# Patient Record
Sex: Male | Born: 1935 | Race: White | Hispanic: No | Marital: Married | Smoking: Former smoker
Health system: Southern US, Community
[De-identification: ages and names within clinical notes are randomized; demographics above are authoritative.]

## PROBLEM LIST (undated history)

## (undated) DIAGNOSIS — N529 Male erectile dysfunction, unspecified: Secondary | ICD-10-CM

## (undated) DIAGNOSIS — K449 Diaphragmatic hernia without obstruction or gangrene: Secondary | ICD-10-CM

## (undated) DIAGNOSIS — C4442 Squamous cell carcinoma of skin of scalp and neck: Secondary | ICD-10-CM

## (undated) DIAGNOSIS — J189 Pneumonia, unspecified organism: Secondary | ICD-10-CM

## (undated) DIAGNOSIS — J439 Emphysema, unspecified: Secondary | ICD-10-CM

## (undated) DIAGNOSIS — J449 Chronic obstructive pulmonary disease, unspecified: Secondary | ICD-10-CM

## (undated) DIAGNOSIS — D519 Vitamin B12 deficiency anemia, unspecified: Secondary | ICD-10-CM

## (undated) DIAGNOSIS — Z9981 Dependence on supplemental oxygen: Secondary | ICD-10-CM

## (undated) DIAGNOSIS — C44320 Squamous cell carcinoma of skin of unspecified parts of face: Secondary | ICD-10-CM

## (undated) DIAGNOSIS — N281 Cyst of kidney, acquired: Secondary | ICD-10-CM

## (undated) DIAGNOSIS — I10 Essential (primary) hypertension: Secondary | ICD-10-CM

## (undated) DIAGNOSIS — F329 Major depressive disorder, single episode, unspecified: Secondary | ICD-10-CM

## (undated) DIAGNOSIS — N400 Enlarged prostate without lower urinary tract symptoms: Secondary | ICD-10-CM

## (undated) DIAGNOSIS — E781 Pure hyperglyceridemia: Secondary | ICD-10-CM

## (undated) DIAGNOSIS — M8580 Other specified disorders of bone density and structure, unspecified site: Secondary | ICD-10-CM

## (undated) DIAGNOSIS — J84112 Idiopathic pulmonary fibrosis: Secondary | ICD-10-CM

## (undated) DIAGNOSIS — I4891 Unspecified atrial fibrillation: Secondary | ICD-10-CM

## (undated) DIAGNOSIS — C434 Malignant melanoma of scalp and neck: Secondary | ICD-10-CM

## (undated) DIAGNOSIS — M1990 Unspecified osteoarthritis, unspecified site: Secondary | ICD-10-CM

## (undated) DIAGNOSIS — G629 Polyneuropathy, unspecified: Secondary | ICD-10-CM

## (undated) DIAGNOSIS — R251 Tremor, unspecified: Secondary | ICD-10-CM

## (undated) DIAGNOSIS — D126 Benign neoplasm of colon, unspecified: Secondary | ICD-10-CM

## (undated) DIAGNOSIS — Z7901 Long term (current) use of anticoagulants: Secondary | ICD-10-CM

## (undated) DIAGNOSIS — F419 Anxiety disorder, unspecified: Secondary | ICD-10-CM

## (undated) DIAGNOSIS — J8410 Pulmonary fibrosis, unspecified: Secondary | ICD-10-CM

## (undated) DIAGNOSIS — K5080 Crohn's disease of both small and large intestine without complications: Secondary | ICD-10-CM

## (undated) DIAGNOSIS — F32A Depression, unspecified: Secondary | ICD-10-CM

## (undated) DIAGNOSIS — I639 Cerebral infarction, unspecified: Secondary | ICD-10-CM

## (undated) HISTORY — DX: Benign prostatic hyperplasia without lower urinary tract symptoms: N40.0

## (undated) HISTORY — DX: Male erectile dysfunction, unspecified: N52.9

## (undated) HISTORY — DX: Tremor, unspecified: R25.1

## (undated) HISTORY — DX: Essential (primary) hypertension: I10

## (undated) HISTORY — DX: Crohn's disease of both small and large intestine without complications: K50.80

## (undated) HISTORY — DX: Cerebral infarction, unspecified: I63.9

## (undated) HISTORY — DX: Pure hyperglyceridemia: E78.1

## (undated) HISTORY — DX: Dependence on supplemental oxygen: Z99.81

## (undated) HISTORY — DX: Pulmonary fibrosis, unspecified: J84.10

## (undated) HISTORY — DX: Benign neoplasm of colon, unspecified: D12.6

## (undated) HISTORY — DX: Cyst of kidney, acquired: N28.1

## (undated) HISTORY — DX: Unspecified osteoarthritis, unspecified site: M19.90

## (undated) HISTORY — PX: MOHS SURGERY: SUR867

## (undated) HISTORY — DX: Long term (current) use of anticoagulants: Z79.01

## (undated) HISTORY — DX: Other specified disorders of bone density and structure, unspecified site: M85.80

## (undated) HISTORY — DX: Pneumonia, unspecified organism: J18.9

## (undated) HISTORY — DX: Idiopathic pulmonary fibrosis: J84.112

## (undated) HISTORY — DX: Polyneuropathy, unspecified: G62.9

## (undated) HISTORY — PX: COLON SURGERY: SHX602

## (undated) HISTORY — DX: Emphysema, unspecified: J43.9

---
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
## 1982-03-24 HISTORY — PX: APPENDECTOMY: SHX54

## 1982-03-24 HISTORY — PX: COLON RESECTION: SHX5231

## 1997-10-30 ENCOUNTER — Emergency Department (HOSPITAL_COMMUNITY): Admission: EM | Admit: 1997-10-30 | Discharge: 1997-10-31 | Payer: Self-pay | Admitting: Emergency Medicine

## 1997-10-31 ENCOUNTER — Ambulatory Visit (HOSPITAL_COMMUNITY): Admission: RE | Admit: 1997-10-31 | Discharge: 1997-10-31 | Payer: Self-pay | Admitting: Emergency Medicine

## 2001-06-16 ENCOUNTER — Encounter: Payer: Self-pay | Admitting: Gastroenterology

## 2002-06-27 ENCOUNTER — Encounter: Payer: Self-pay | Admitting: Internal Medicine

## 2004-02-02 ENCOUNTER — Ambulatory Visit: Payer: Self-pay | Admitting: Internal Medicine

## 2004-08-02 ENCOUNTER — Encounter: Payer: Self-pay | Admitting: Family Medicine

## 2004-08-02 ENCOUNTER — Ambulatory Visit: Payer: Self-pay | Admitting: Family Medicine

## 2004-08-12 ENCOUNTER — Ambulatory Visit: Payer: Self-pay | Admitting: Family Medicine

## 2004-12-12 ENCOUNTER — Ambulatory Visit: Payer: Self-pay | Admitting: Family Medicine

## 2004-12-27 ENCOUNTER — Ambulatory Visit: Payer: Self-pay | Admitting: Internal Medicine

## 2004-12-30 ENCOUNTER — Ambulatory Visit: Payer: Self-pay | Admitting: Family Medicine

## 2005-03-24 HISTORY — PX: CATARACT EXTRACTION W/ INTRAOCULAR LENS  IMPLANT, BILATERAL: SHX1307

## 2005-08-29 ENCOUNTER — Ambulatory Visit: Payer: Self-pay | Admitting: Family Medicine

## 2005-09-16 ENCOUNTER — Ambulatory Visit: Payer: Self-pay | Admitting: Family Medicine

## 2006-02-11 ENCOUNTER — Ambulatory Visit: Payer: Self-pay | Admitting: Gastroenterology

## 2006-02-26 ENCOUNTER — Ambulatory Visit: Payer: Self-pay | Admitting: Gastroenterology

## 2006-08-27 DIAGNOSIS — K8020 Calculus of gallbladder without cholecystitis without obstruction: Secondary | ICD-10-CM | POA: Insufficient documentation

## 2006-08-27 DIAGNOSIS — K5090 Crohn's disease, unspecified, without complications: Secondary | ICD-10-CM | POA: Insufficient documentation

## 2006-08-27 DIAGNOSIS — E538 Deficiency of other specified B group vitamins: Secondary | ICD-10-CM | POA: Insufficient documentation

## 2006-09-16 ENCOUNTER — Ambulatory Visit: Payer: Self-pay | Admitting: Internal Medicine

## 2006-09-16 DIAGNOSIS — C4490 Unspecified malignant neoplasm of skin, unspecified: Secondary | ICD-10-CM | POA: Insufficient documentation

## 2006-09-16 LAB — CONVERTED CEMR LAB
Nitrite: NEGATIVE
Protein, U semiquant: NEGATIVE
Urobilinogen, UA: NEGATIVE

## 2006-09-21 LAB — CONVERTED CEMR LAB
ALT: 24 units/L (ref 0–53)
AST: 35 units/L (ref 0–37)
BUN: 15 mg/dL (ref 6–23)
Basophils Relative: 0.5 % (ref 0.0–1.0)
CO2: 28 meq/L (ref 19–32)
Chloride: 103 meq/L (ref 96–112)
Creatinine, Ser: 1 mg/dL (ref 0.4–1.5)
HCT: 44.1 % (ref 39.0–52.0)
Hemoglobin: 14.7 g/dL (ref 13.0–17.0)
LDL Cholesterol: 87 mg/dL (ref 0–99)
Monocytes Absolute: 0.6 10*3/uL (ref 0.2–0.7)
Monocytes Relative: 7.5 % (ref 3.0–11.0)
Neutrophils Relative %: 56.1 % (ref 43.0–77.0)
PSA: 0.86 ng/mL (ref 0.10–4.00)
Potassium: 3.9 meq/L (ref 3.5–5.1)
RBC: 4.71 M/uL (ref 4.22–5.81)
RDW: 12.1 % (ref 11.5–14.6)
TSH: 1.39 microintl units/mL (ref 0.35–5.50)
Total CHOL/HDL Ratio: 5.1
VLDL: 40 mg/dL (ref 0–40)

## 2007-04-28 ENCOUNTER — Telehealth: Payer: Self-pay | Admitting: Internal Medicine

## 2007-04-30 ENCOUNTER — Telehealth: Payer: Self-pay | Admitting: Internal Medicine

## 2007-10-28 ENCOUNTER — Telehealth (INDEPENDENT_AMBULATORY_CARE_PROVIDER_SITE_OTHER): Payer: Self-pay | Admitting: *Deleted

## 2007-11-24 ENCOUNTER — Ambulatory Visit: Payer: Self-pay | Admitting: Internal Medicine

## 2007-11-24 DIAGNOSIS — K644 Residual hemorrhoidal skin tags: Secondary | ICD-10-CM | POA: Insufficient documentation

## 2007-11-25 ENCOUNTER — Telehealth (INDEPENDENT_AMBULATORY_CARE_PROVIDER_SITE_OTHER): Payer: Self-pay | Admitting: *Deleted

## 2007-12-02 ENCOUNTER — Ambulatory Visit: Payer: Self-pay | Admitting: Internal Medicine

## 2007-12-02 DIAGNOSIS — F528 Other sexual dysfunction not due to a substance or known physiological condition: Secondary | ICD-10-CM | POA: Insufficient documentation

## 2007-12-02 DIAGNOSIS — R259 Unspecified abnormal involuntary movements: Secondary | ICD-10-CM | POA: Insufficient documentation

## 2007-12-09 ENCOUNTER — Encounter (INDEPENDENT_AMBULATORY_CARE_PROVIDER_SITE_OTHER): Payer: Self-pay | Admitting: *Deleted

## 2007-12-09 ENCOUNTER — Telehealth (INDEPENDENT_AMBULATORY_CARE_PROVIDER_SITE_OTHER): Payer: Self-pay | Admitting: *Deleted

## 2007-12-09 LAB — CONVERTED CEMR LAB
AST: 31 units/L (ref 0–37)
BUN: 12 mg/dL (ref 6–23)
Basophils Absolute: 0.1 10*3/uL (ref 0.0–0.1)
Basophils Relative: 0.7 % (ref 0.0–3.0)
CO2: 26 meq/L (ref 19–32)
Calcium: 9.2 mg/dL (ref 8.4–10.5)
Cholesterol: 132 mg/dL (ref 0–200)
Eosinophils Absolute: 0.2 10*3/uL (ref 0.0–0.7)
Eosinophils Relative: 2.4 % (ref 0.0–5.0)
GFR calc Af Amer: 121 mL/min
Glucose, Bld: 109 mg/dL — ABNORMAL HIGH (ref 70–99)
HCT: 41 % (ref 39.0–52.0)
Hemoglobin: 14.4 g/dL (ref 13.0–17.0)
MCHC: 35 g/dL (ref 30.0–36.0)
MCV: 92.9 fL (ref 78.0–100.0)
Monocytes Absolute: 0.5 10*3/uL (ref 0.1–1.0)
Neutro Abs: 4.7 10*3/uL (ref 1.4–7.7)
RBC: 4.41 M/uL (ref 4.22–5.81)
Sodium: 140 meq/L (ref 135–145)
VLDL: 23 mg/dL (ref 0–40)
WBC: 7.5 10*3/uL (ref 4.5–10.5)

## 2008-01-25 ENCOUNTER — Telehealth (INDEPENDENT_AMBULATORY_CARE_PROVIDER_SITE_OTHER): Payer: Self-pay | Admitting: *Deleted

## 2008-07-28 ENCOUNTER — Ambulatory Visit: Payer: Self-pay | Admitting: Internal Medicine

## 2008-11-01 ENCOUNTER — Ambulatory Visit: Payer: Self-pay | Admitting: Internal Medicine

## 2008-11-01 DIAGNOSIS — M19079 Primary osteoarthritis, unspecified ankle and foot: Secondary | ICD-10-CM | POA: Insufficient documentation

## 2008-12-06 ENCOUNTER — Ambulatory Visit: Payer: Self-pay | Admitting: Internal Medicine

## 2008-12-12 ENCOUNTER — Ambulatory Visit: Payer: Self-pay | Admitting: Internal Medicine

## 2008-12-12 ENCOUNTER — Encounter (INDEPENDENT_AMBULATORY_CARE_PROVIDER_SITE_OTHER): Payer: Self-pay | Admitting: *Deleted

## 2008-12-12 ENCOUNTER — Encounter: Payer: Self-pay | Admitting: Internal Medicine

## 2008-12-12 LAB — CONVERTED CEMR LAB
ALT: 18 units/L (ref 0–53)
AST: 26 units/L (ref 0–37)
Basophils Relative: 0.4 % (ref 0.0–3.0)
CO2: 27 meq/L (ref 19–32)
Calcium: 9.3 mg/dL (ref 8.4–10.5)
Chloride: 105 meq/L (ref 96–112)
Creatinine, Ser: 1 mg/dL (ref 0.4–1.5)
Eosinophils Relative: 2.3 % (ref 0.0–5.0)
Folate: 18.8 ng/mL
HDL: 32.7 mg/dL — ABNORMAL LOW (ref >39.00)
Hemoglobin: 14.5 g/dL (ref 13.0–17.0)
LDL Cholesterol: 80 mg/dL (ref 0–99)
Lymphocytes Relative: 27.1 % (ref 12.0–46.0)
MCV: 96.3 fL (ref 78.0–100.0)
Neutro Abs: 4.6 10*3/uL (ref 1.4–7.7)
Neutrophils Relative %: 62.3 % (ref 43.0–77.0)
PSA: 0.74 ng/mL (ref 0.10–4.00)
RBC: 4.44 M/uL (ref 4.22–5.81)
Sodium: 140 meq/L (ref 135–145)
Total CHOL/HDL Ratio: 4
Triglycerides: 142 mg/dL (ref 0.0–149.0)
WBC: 7.4 10*3/uL (ref 4.5–10.5)

## 2009-01-17 ENCOUNTER — Telehealth (INDEPENDENT_AMBULATORY_CARE_PROVIDER_SITE_OTHER): Payer: Self-pay | Admitting: *Deleted

## 2009-01-29 ENCOUNTER — Ambulatory Visit: Payer: Self-pay | Admitting: Gastroenterology

## 2009-01-29 ENCOUNTER — Encounter (INDEPENDENT_AMBULATORY_CARE_PROVIDER_SITE_OTHER): Payer: Self-pay | Admitting: *Deleted

## 2009-01-29 DIAGNOSIS — R159 Full incontinence of feces: Secondary | ICD-10-CM | POA: Insufficient documentation

## 2009-02-07 ENCOUNTER — Telehealth (INDEPENDENT_AMBULATORY_CARE_PROVIDER_SITE_OTHER): Payer: Self-pay | Admitting: *Deleted

## 2009-02-22 ENCOUNTER — Ambulatory Visit: Payer: Self-pay | Admitting: Gastroenterology

## 2009-03-01 ENCOUNTER — Telehealth: Payer: Self-pay | Admitting: Gastroenterology

## 2009-03-01 ENCOUNTER — Inpatient Hospital Stay (HOSPITAL_COMMUNITY): Admission: EM | Admit: 2009-03-01 | Discharge: 2009-03-06 | Payer: Self-pay | Admitting: Emergency Medicine

## 2009-03-02 ENCOUNTER — Encounter: Payer: Self-pay | Admitting: Gastroenterology

## 2009-03-02 ENCOUNTER — Ambulatory Visit: Payer: Self-pay | Admitting: Gastroenterology

## 2009-03-06 ENCOUNTER — Telehealth: Payer: Self-pay | Admitting: Gastroenterology

## 2009-03-07 ENCOUNTER — Encounter: Payer: Self-pay | Admitting: Internal Medicine

## 2009-04-04 ENCOUNTER — Ambulatory Visit: Payer: Self-pay | Admitting: Gastroenterology

## 2009-04-04 DIAGNOSIS — K2970 Gastritis, unspecified, without bleeding: Secondary | ICD-10-CM | POA: Insufficient documentation

## 2009-04-04 DIAGNOSIS — K2990 Gastroduodenitis, unspecified, without bleeding: Secondary | ICD-10-CM

## 2009-08-29 ENCOUNTER — Ambulatory Visit: Payer: Self-pay | Admitting: Internal Medicine

## 2009-08-29 DIAGNOSIS — G609 Hereditary and idiopathic neuropathy, unspecified: Secondary | ICD-10-CM | POA: Insufficient documentation

## 2009-08-30 ENCOUNTER — Encounter: Payer: Self-pay | Admitting: Internal Medicine

## 2009-08-30 LAB — CONVERTED CEMR LAB: Vit D, 25-Hydroxy: 41 ng/mL (ref 30–89)

## 2009-08-31 ENCOUNTER — Telehealth (INDEPENDENT_AMBULATORY_CARE_PROVIDER_SITE_OTHER): Payer: Self-pay | Admitting: *Deleted

## 2009-09-03 ENCOUNTER — Telehealth (INDEPENDENT_AMBULATORY_CARE_PROVIDER_SITE_OTHER): Payer: Self-pay | Admitting: *Deleted

## 2009-09-03 LAB — CONVERTED CEMR LAB
Folate: 16 ng/mL
Vitamin B-12: 340 pg/mL (ref 211–911)

## 2009-09-17 ENCOUNTER — Encounter: Payer: Self-pay | Admitting: Internal Medicine

## 2009-12-07 ENCOUNTER — Ambulatory Visit: Payer: Self-pay | Admitting: Internal Medicine

## 2009-12-07 DIAGNOSIS — M8580 Other specified disorders of bone density and structure, unspecified site: Secondary | ICD-10-CM | POA: Insufficient documentation

## 2009-12-10 ENCOUNTER — Ambulatory Visit: Payer: Self-pay | Admitting: Internal Medicine

## 2009-12-10 DIAGNOSIS — N400 Enlarged prostate without lower urinary tract symptoms: Secondary | ICD-10-CM | POA: Insufficient documentation

## 2009-12-12 LAB — CONVERTED CEMR LAB
ALT: 15 units/L (ref 0–53)
AST: 23 units/L (ref 0–37)
BUN: 16 mg/dL (ref 6–23)
Basophils Absolute: 0 10*3/uL (ref 0.0–0.1)
Basophils Relative: 0.6 % (ref 0.0–3.0)
GFR calc non Af Amer: 73.13 mL/min (ref >60)
HCT: 42.9 % (ref 39.0–52.0)
Hemoglobin: 14.6 g/dL (ref 13.0–17.0)
LDL Cholesterol: 75 mg/dL (ref 0–99)
Lymphocytes Relative: 30.4 % (ref 12.0–46.0)
Lymphs Abs: 2.3 10*3/uL (ref 0.7–4.0)
MCHC: 34.1 g/dL (ref 30.0–36.0)
Monocytes Relative: 7.6 % (ref 3.0–12.0)
Neutro Abs: 4.5 10*3/uL (ref 1.4–7.7)
Potassium: 3.8 meq/L (ref 3.5–5.1)
RBC: 4.58 M/uL (ref 4.22–5.81)
RDW: 14.3 % (ref 11.5–14.6)
Sodium: 142 meq/L (ref 135–145)
Total CHOL/HDL Ratio: 4
VLDL: 29.8 mg/dL (ref 0.0–40.0)

## 2009-12-21 ENCOUNTER — Telehealth: Payer: Self-pay | Admitting: Internal Medicine

## 2010-01-22 DIAGNOSIS — I10 Essential (primary) hypertension: Secondary | ICD-10-CM

## 2010-01-22 HISTORY — DX: Essential (primary) hypertension: I10

## 2010-02-01 ENCOUNTER — Telehealth: Payer: Self-pay | Admitting: Internal Medicine

## 2010-02-04 ENCOUNTER — Ambulatory Visit: Payer: Self-pay | Admitting: Internal Medicine

## 2010-02-04 DIAGNOSIS — I10 Essential (primary) hypertension: Secondary | ICD-10-CM | POA: Insufficient documentation

## 2010-02-10 ENCOUNTER — Emergency Department (HOSPITAL_COMMUNITY): Admission: EM | Admit: 2010-02-10 | Discharge: 2010-02-10 | Payer: Self-pay | Admitting: Emergency Medicine

## 2010-02-28 ENCOUNTER — Ambulatory Visit: Payer: Self-pay | Admitting: Internal Medicine

## 2010-03-06 ENCOUNTER — Ambulatory Visit: Payer: Self-pay | Admitting: Internal Medicine

## 2010-03-06 DIAGNOSIS — K921 Melena: Secondary | ICD-10-CM | POA: Insufficient documentation

## 2010-03-07 ENCOUNTER — Telehealth: Payer: Self-pay | Admitting: Gastroenterology

## 2010-03-08 ENCOUNTER — Encounter: Payer: Self-pay | Admitting: Gastroenterology

## 2010-03-08 ENCOUNTER — Ambulatory Visit: Payer: Self-pay | Admitting: Gastroenterology

## 2010-03-08 DIAGNOSIS — M1990 Unspecified osteoarthritis, unspecified site: Secondary | ICD-10-CM | POA: Insufficient documentation

## 2010-03-08 DIAGNOSIS — K649 Unspecified hemorrhoids: Secondary | ICD-10-CM | POA: Insufficient documentation

## 2010-03-08 DIAGNOSIS — K625 Hemorrhage of anus and rectum: Secondary | ICD-10-CM | POA: Insufficient documentation

## 2010-03-08 DIAGNOSIS — K648 Other hemorrhoids: Secondary | ICD-10-CM | POA: Insufficient documentation

## 2010-03-14 LAB — CONVERTED CEMR LAB
Basophils Absolute: 0 10*3/uL (ref 0.0–0.1)
CRP, High Sensitivity: 1.22 (ref 0.00–5.00)
Eosinophils Absolute: 0.2 10*3/uL (ref 0.0–0.7)
HCT: 42.2 % (ref 39.0–52.0)
Hemoglobin: 14 g/dL (ref 13.0–17.0)
Lymphs Abs: 2.1 10*3/uL (ref 0.7–4.0)
MCHC: 33.1 g/dL (ref 30.0–36.0)
Monocytes Absolute: 0.7 10*3/uL (ref 0.1–1.0)
Monocytes Relative: 8.6 % (ref 3.0–12.0)
Neutro Abs: 5.1 10*3/uL (ref 1.4–7.7)
Platelets: 187 10*3/uL (ref 150.0–400.0)
RDW: 13.8 % (ref 11.5–14.6)

## 2010-03-24 DIAGNOSIS — D126 Benign neoplasm of colon, unspecified: Secondary | ICD-10-CM

## 2010-03-24 HISTORY — DX: Benign neoplasm of colon, unspecified: D12.6

## 2010-04-03 ENCOUNTER — Ambulatory Visit
Admission: RE | Admit: 2010-04-03 | Discharge: 2010-04-03 | Payer: Self-pay | Source: Home / Self Care | Attending: Gastroenterology | Admitting: Gastroenterology

## 2010-04-03 ENCOUNTER — Encounter: Payer: Self-pay | Admitting: Gastroenterology

## 2010-04-04 ENCOUNTER — Telehealth: Payer: Self-pay | Admitting: Internal Medicine

## 2010-04-08 ENCOUNTER — Telehealth (INDEPENDENT_AMBULATORY_CARE_PROVIDER_SITE_OTHER): Payer: Self-pay | Admitting: *Deleted

## 2010-04-08 ENCOUNTER — Ambulatory Visit
Admission: RE | Admit: 2010-04-08 | Discharge: 2010-04-08 | Payer: Self-pay | Source: Home / Self Care | Attending: Internal Medicine | Admitting: Internal Medicine

## 2010-04-08 DIAGNOSIS — I10 Essential (primary) hypertension: Secondary | ICD-10-CM

## 2010-04-09 ENCOUNTER — Encounter: Payer: Self-pay | Admitting: Gastroenterology

## 2010-04-17 ENCOUNTER — Ambulatory Visit: Admit: 2010-04-17 | Payer: Self-pay | Admitting: Gastroenterology

## 2010-04-23 NOTE — Assessment & Plan Note (Signed)
Summary: DISCUSS BP/RH......   Vital Signs:  Patient profile:   77 year old male Height:      71 inches Weight:      180.25 pounds BMI:     25.23 Pulse rate:   63 / minute Pulse rhythm:   regular BP sitting:   162 / 82  (left arm) Cuff size:   regular  Vitals Entered By: Army Fossa CMA (February 04, 2010 10:48 AM) CC: Pt here to discuss BP. Comments His machine read 181/81 [employer]   History of Present Illness: the patient  checks  his BP daily, in the last week it has been consistently  between 160/95 and  190/95 his pulse is in the 60s, he feels well  Current Medications (verified): 1)  Pentasa 250 Mg  Cpcr (Mesalamine) .... 2 By Mouth Two Times A Day 2)  Propranolol Hcl 80 Mg  Tabs (Propranolol Hcl) .Marland Kitchen.. 1 By Mouth Two Times A Day 3)  Gemfibrozil 600 Mg  Tabs (Gemfibrozil) .Marland Kitchen.. 1 By Mouth Two Times A Day 4)  Buspirone Hcl 15 Mg  Tabs (Buspirone Hcl) .Marland Kitchen.. 1 By Mouth  Two Times A Day 5)  Nascobal   Gel (Cyanocobalamin Gel) .... Use 1 Spray Per Week 6)  Flax Seed Oil 1000 Mg Caps (Flaxseed (Linseed)) .... Take Two Tabs By Mouth Once Daily 7)  Lidocaine-Hydrocortisone Ace 3-0.5 % Crea (Lidocaine-Hydrocortisone Ace) .... As Needed 8)  Viagra 25 Mg Tabs (Sildenafil Citrate) .... As Needed 9)  Glucosamine 500 Mg Caps (Glucosamine Sulfate) .... Take Two By Mouth Once Daily 10)  Caltrate 600+d 600-400 Mg-Unit Tabs (Calcium Carbonate-Vitamin D) .... Take Two By Mouth Once Daily 11)  Clobetasol Propionate 0.05 % Crea (Clobetasol Propionate) .... Apply To Knees Two Times A Day As Needed.  Allergies (verified): No Known Drug Allergies  Past History:  Past Medical History: Muscle tremor, saw neurology remotely elsewhere, was Rx inderal  HYPERTRIGLYCERIDEMIA   B12 DEFICIENCY   Crohn's ileocolitis CARCINOMA, SKIN, SQUAMOUS CELL  GALLSTONES  Hemorrhoids Osteopenia per DEXA 9-10 Hypertension, mild , dx 11-11  Social History: Reviewed history from 12/07/2009 and no  changes required.  Married 4 children lost mother 10-11 former smoker , quit 1992 Alcohol Use - yes one beer every 2-3 weeks Illicit Drug Use - no  Review of Systems       denies chest pain No headaches No lower extremity edema Good low sodium diet  Physical Exam  General:  alert and well-developed.   Lungs:  normal respiratory effort, no intercostal retractions, no accessory muscle use, and normal breath sounds.   Heart:  normal rate, regular rhythm, no murmur, and no gallop.   Extremities:  no lower extremity edema   Impression & Recommendations:  Problem # 1:  HYPERTENSION (ICD-401.9) very mild HTN His BP machine seems to be overreading, nevertheless his BP today is slightly elevated recommend to change his BP machine  start amlodipine, see instructions   His updated medication list for this problem includes:    Propranolol Hcl 80 Mg Tabs (Propranolol hcl) .Marland Kitchen... 1 by mouth two times a day    Amlodipine Besylate 2.5 Mg Tabs (Amlodipine besylate) .Marland Kitchen... 1 by mouth once daily  Complete Medication List: 1)  Pentasa 250 Mg Cpcr (Mesalamine) .... 2 by mouth two times a day 2)  Propranolol Hcl 80 Mg Tabs (Propranolol hcl) .Marland Kitchen.. 1 by mouth two times a day 3)  Amlodipine Besylate 2.5 Mg Tabs (Amlodipine besylate) .Marland Kitchen.. 1 by mouth once daily  4)  Gemfibrozil 600 Mg Tabs (Gemfibrozil) .Marland Kitchen.. 1 by mouth two times a day 5)  Buspirone Hcl 15 Mg Tabs (Buspirone hcl) .Marland Kitchen.. 1 by mouth  two times a day 6)  Nascobal Gel (Cyanocobalamin gel) .... Use 1 spray per week 7)  Flax Seed Oil 1000 Mg Caps (Flaxseed (linseed)) .... Take two tabs by mouth once daily 8)  Lidocaine-hydrocortisone Ace 3-0.5 % Crea (Lidocaine-hydrocortisone ace) .... As needed 9)  Viagra 25 Mg Tabs (Sildenafil citrate) .... As needed 10)  Glucosamine 500 Mg Caps (Glucosamine sulfate) .... Take two by mouth once daily 11)  Caltrate 600+d 600-400 Mg-unit Tabs (Calcium carbonate-vitamin d) .... Take two by mouth once  daily 12)  Clobetasol Propionate 0.05 % Crea (Clobetasol propionate) .... Apply to knees two times a day as needed.  Patient Instructions: 1)  check BPs daily at different times 2)  nurse visit in 4 weeks for BP check 3)  call if side effects 4)  call if BP > 140/85, < 110/60 Prescriptions: AMLODIPINE BESYLATE 2.5 MG TABS (AMLODIPINE BESYLATE) 1 by mouth once daily  #30 x 6   Entered and Authorized by:   Elita Quick E. Paz MD   Signed by:   Nolon Rod. Paz MD on 02/04/2010   Method used:   Print then Give to Patient   RxID:   [id number]    Orders Added: 1)  Est. Patient Level III [14782]   Immunization History:  Influenza Immunization History:    Influenza:  historical (01/07/2010)   Immunization History:  Influenza Immunization History:    Influenza:  Historical (01/07/2010)

## 2010-04-23 NOTE — Assessment & Plan Note (Signed)
Summary: yearly check/lab/cbs   Vital Signs:  Patient profile:   77 year old male Height:      71 inches Weight:      182 pounds Pulse rate:   64 / minute Pulse rhythm:   regular BP sitting:   150 / 65  (left arm) Cuff size:   regular  Vitals Entered By: Army Fossa CMA (December 07, 2009 12:36 PM) CC: yearly check, not fasting Comments pharm- [facility] refill on viagra?  will wait a little longer of flu shot   History of Present Illness: Here for Medicare AWV:  1.   Risk factors based on Past M, S, F history: yes  2.   Physical Activities: not very active lately , still trying to do stretchings 4 times a week  3.   Depression/mood: denies , no problems noted  4.   Hearing: decreased a little , states not enough to see a specialist 5.   ADL's: totally independent  6.   Fall Risk: no recent falls, low risk  7.   Home Safety: does feels afe at home  8.   Height, weight, &visual acuity: see VS, vision good w/  correction  9.   Counseling: yes , see below  10.   Labs ordered based on risk factors:  yes  11.           Referral Coordination: if needed  12.           Care Plan: see a/p  13.            Cognitive Assessment --motor skills , memory and cognition seemed  appropriate  In addition, we discussed the following issues neuropathy-- w/u neg , never got to take neurontin , symptoms not enough for him to take meds  BP slightly  elevated here, ambulatory BPs 120s/80s  Muscle tremor, on inderal  well controlled  osteoipenia-- on Ca and Vit D  B12 DEFICIENCY -- good medication compliance w/ nascobal   Crohn's-- quiet for now, no symptoms        Current Medications (verified): 1)  Pentasa 250 Mg  Cpcr (Mesalamine) .... 2 By Mouth Two Times A Day 2)  Propranolol Hcl 80 Mg  Tabs (Propranolol Hcl) .Marland Kitchen.. 1 By Mouth Two Times A Day 3)  Gemfibrozil 600 Mg  Tabs (Gemfibrozil) .Marland Kitchen.. 1 By Mouth Two Times A Day 4)  Buspirone Hcl 15 Mg  Tabs (Buspirone Hcl) .Marland Kitchen.. 1 By Mouth  Two Times  A Day 5)  Nascobal   Gel (Cyanocobalamin Gel) .... Use 1 Spray Per Week 6)  Flax Seed Oil 1000 Mg Caps (Flaxseed (Linseed)) .... Take Two Tabs By Mouth Once Daily 7)  Lidocaine-Hydrocortisone Ace 3-0.5 % Crea (Lidocaine-Hydrocortisone Ace) .... As Needed 8)  Viagra 25 Mg Tabs (Sildenafil Citrate) .... As Needed 9)  Glucosamine 500 Mg Caps (Glucosamine Sulfate) .... Take Two By Mouth Once Daily 10)  Caltrate 600+d 600-400 Mg-Unit Tabs (Calcium Carbonate-Vitamin D) .... Take Two By Mouth Once Daily 11)  Clobetasol Propionate 0.05 % Crea (Clobetasol Propionate) .... Apply To Knees Two Times A Day As Needed.  Allergies (verified): No Known Drug Allergies  Past History:  Past Medical History: Reviewed history from 08/29/2009 and no changes required. Muscle tremor, saw neurology remotely elsewhere, was Rx inderal  HYPERTRIGLYCERIDEMIA   B12 DEFICIENCY   Crohn's ileocolitis CARCINOMA, SKIN, SQUAMOUS CELL  GALLSTONES  Hemorrhoids Osteopenia per DEXA 9-10  Past Surgical History: Reviewed history from 01/29/2009 and no changes required. Resection  of distal ileum and cecum with appendectomy (18-inch small intestine), 1984 for Crohn's disease  Family History: Crohn's: brother Breast Cancer:mother colon ca--no dementia-- S Diabetes: sister MI-- F age 83   Social History:  Married 4 children lost mother 10-11 former smoker , quit 1992 Alcohol Use - yes one beer every 2-3 weeks Illicit Drug Use - no  Review of Systems CV:  Denies chest pain or discomfort and swelling of feet. Resp:  Denies cough, coughing up blood, and shortness of breath. GI:  Denies bloody stools, diarrhea, nausea, and vomiting; reports is recovering from hemorrhoids exhacerbation, had pain w/ BMs , did have some bleeding (in the toilete paper). GU:  Denies dysuria, hematuria, urinary frequency, and urinary hesitancy.  Physical Exam  General:  alert, well-developed, and well-nourished.   Neck:  no masses and  no thyromegaly.   Lungs:  normal respiratory effort, no intercostal retractions, no accessory muscle use, and normal breath sounds.   Heart:  normal rate, regular rhythm, no murmur, and no gallop.   Abdomen:  soft, non-tender, no distention, no masses, no guarding, and no rigidity.   Rectal:  declined, just recovering  from a  hemorrhoid problem Extremities:  no lower extremity edema Neurologic:   alert, oriented x3 Mild tremor mostly on his head, at  baseline Psych:  not anxious appearing and not depressed appearing.     Impression & Recommendations:  Problem # 1:  HEALTH SCREENING (ICD-V70.0)  Td 2000, prefers to wait for his next CPX in  2012 Pneumonia shot 2009 flu shot--states will get it later this year Shingles immunization--information provided   colonoscopy in 2007. last colonoscopy 02/2009, next 2015  PSA   diet and exercise discussed  Orders: Medicare -1st Annual Wellness Visit [phone])  Problem # 2:  OSTEOPENIA (ICD-733.90) osteopenia her bone density test 11/2008 Was recommended calcium and vitamin D declined  Fosamax labs vitamin D within normal His updated medication list for this problem includes:    Caltrate 600+d 600-400 Mg-unit Tabs (Calcium carbonate-vitamin d) .Marland Kitchen... Take two by mouth once daily  Problem # 3:  PERIPHERAL NEUROPATHY (ICD-356.9) w/u neg , never got to take neurontin , symptoms not enough for him to take meds   Problem # 4:  TREMOR (ICD-781.0) stable  Problem # 5:  HYPERTRIGLYCERIDEMIA (ICD-272.1)  due for labs  His updated medication list for this problem includes:    Gemfibrozil 600 Mg Tabs (Gemfibrozil) .Marland Kitchen... 1 by mouth two times a day  Labs Reviewed: SGOT: 26 (12/06/2008)   SGPT: 18 (12/06/2008)   HDL:32.70 (12/06/2008), 26.9 (12/02/2007)  LDL:80 (12/06/2008), 82 (12/02/2007)  Chol:141 (12/06/2008), 132 (12/02/2007)  Trig:142.0 (12/06/2008), 114 (12/02/2007)  Problem # 6:  B12 DEFICIENCY (ICD-266.2) good compliance with  medicines last B12 level normal  Problem # 7:  CROHN'S DISEASE (ICD-555.9) recently he had hemorrhoidal pain and drops of blood in the toilet paper, doubt that was related to Crohn's disease (no nausea, vomiting, diarrhea, fever)  Complete Medication List: 1)  Pentasa 250 Mg Cpcr (Mesalamine) .... 2 by mouth two times a day 2)  Propranolol Hcl 80 Mg Tabs (Propranolol hcl) .Marland Kitchen.. 1 by mouth two times a day 3)  Gemfibrozil 600 Mg Tabs (Gemfibrozil) .Marland Kitchen.. 1 by mouth two times a day 4)  Buspirone Hcl 15 Mg Tabs (Buspirone hcl) .Marland Kitchen.. 1 by mouth  two times a day 5)  Nascobal Gel (Cyanocobalamin gel) .... Use 1 spray per week 6)  Flax Seed Oil 1000 Mg Caps (Flaxseed (linseed)) .... Take  two tabs by mouth once daily 7)  Lidocaine-hydrocortisone Ace 3-0.5 % Crea (Lidocaine-hydrocortisone ace) .... As needed 8)  Viagra 25 Mg Tabs (Sildenafil citrate) .... As needed 9)  Glucosamine 500 Mg Caps (Glucosamine sulfate) .... Take two by mouth once daily 10)  Caltrate 600+d 600-400 Mg-unit Tabs (Calcium carbonate-vitamin d) .... Take two by mouth once daily 11)  Clobetasol Propionate 0.05 % Crea (Clobetasol propionate) .... Apply to knees two times a day as needed.  Patient Instructions: 1)  come back fasting 2)  FLP, BMP, AST, ALT--- dx hyperlipidemia 3)  PSA---dx prostate cancer screening 4)  CBC--- dx Crohn's disease 5)  Please schedule a follow-up appointment in 6 months .  Prescriptions: BUSPIRONE HCL 15 MG  TABS (BUSPIRONE HCL) 1 by mouth  two times a day  #180 x 3   Entered by:   Army Fossa CMA   Authorized by:   Nolon Rod. Paz MD   Signed by:   Army Fossa CMA on 12/07/2009   Method used:   Faxed to ...       [facility] (mail-order)             , [place]         Ph: [phone]       Fax: [phone]   RxID:   [id number] GEMFIBROZIL 600 MG  TABS (GEMFIBROZIL) 1 by mouth two times a day  #180 x 3   Entered by:   Army Fossa CMA   Authorized by:   Nolon Rod. Paz MD   Signed by:    Army Fossa CMA on 12/07/2009   Method used:   Faxed to ...       [facility] (mail-order)             , [place]         Ph: [phone]       Fax: [phone]   RxID:   [id number] PROPRANOLOL HCL 80 MG  TABS (PROPRANOLOL HCL) 1 by mouth two times a day  #180 x 3   Entered by:   Army Fossa CMA   Authorized by:   Nolon Rod. Paz MD   Signed by:   Army Fossa CMA on 12/07/2009   Method used:   Faxed to ...       [facility] (mail-order)             , [place]         Ph: [phone]       Fax: [phone]   RxID:   [id number] PENTASA 250 MG  CPCR (MESALAMINE) 2 by mouth two times a day  #360 x 3   Entered by:   Army Fossa CMA   Authorized by:   Nolon Rod. Paz MD   Signed by:   Army Fossa CMA on 12/07/2009   Method used:   Faxed to ...       [facility] (mail-order)             , [place]         Ph: [phone]       Fax: [phone]   RxID:   [id number]

## 2010-04-23 NOTE — Assessment & Plan Note (Signed)
Summary: BP CHECK/KN   Nurse Visit   Vital Signs:  Patient profile:   77 year old male BP sitting:   130 / 66  (left arm) Cuff size:   large  Vitals Entered By: Lucious Groves CMA (February 28, 2010 10:38 AM) CC: BP check./kb Comments Patient brought his new machine with him. The machine gave a reading of 144/69 and I could hear 130/66. He was advised that this is ok and he could follow up as planned. He expressed understanding.    Allergies: No Known Drug Allergies  Orders Added: 1)  Est. Patient Level I [04540]

## 2010-04-23 NOTE — Progress Notes (Signed)
Summary: ambulatory BP readings  Phone Note Outgoing Call   Summary of Call: advise patient:  I have reviewed his BP readings, they are mostly within normal. He has a occasional high reading. No changes for  now Burien E. Kimimila Tauzin MD  February 01, 2010 5:15 PM   Follow-up for Phone Call        I spoke with pt he is aware. Army Fossa CMA  February 01, 2010 5:19 PM

## 2010-04-23 NOTE — Progress Notes (Signed)
Summary: Refill Request  Phone Note Refill Request Call back at [phone] Message from:  Pharmacy on August 31, 2009 12:47 PM  Refills Requested: Medication #1:  BUSPIRONE HCL 15 MG  TABS 1 by mouth  two times a day   Dosage confirmed as above?Dosage Confirmed   Supply Requested: 3 months  Medication #2:  PROPRANOLOL HCL 80 MG  TABS 1 by mouth two times a day   Dosage confirmed as above?Dosage Confirmed   Supply Requested: 3 months [facility]  Next Appointment Scheduled: none Initial call taken by: Harold Barban,  August 31, 2009 12:47 PM    Prescriptions: BUSPIRONE HCL 15 MG  TABS (BUSPIRONE HCL) 1 by mouth  two times a day  #180 x 0   Entered by:   Jeremy Johann CMA   Authorized by:   Nolon Rod. Paz MD   Signed by:   Jeremy Johann CMA on 08/31/2009   Method used:   Faxed to ...       [facility] (mail-order)             ,          Ph: [phone]       Fax: [phone]   RxID:   [id number] PROPRANOLOL HCL 80 MG  TABS (PROPRANOLOL HCL) 1 by mouth two times a day  #180 x 0   Entered by:   Jeremy Johann CMA   Authorized by:   Nolon Rod. Paz MD   Signed by:   Jeremy Johann CMA on 08/31/2009   Method used:   Faxed to ...       [facility]* (mail-order)             ,          Ph: [phone]       Fax: [phone]   RxID:   [id number]

## 2010-04-23 NOTE — Progress Notes (Signed)
Summary: Meds   Phone Note Outgoing Call   Summary of Call: patient requests a refill on lidocaine. Please ask  the patient what is he using it for? hemorrhoids?  Is he  bleeding? Jose E. Paz MD  December 21, 2009 3:45 PM   Follow-up for Phone Call        No answer, no voicemail Army Fossa [facility]  December 21, 2009 4:20 PM   Additional Follow-up for Phone Call Additional follow up Details #1::        Left message for pt to call back.  Additional Follow-up by: Army Fossa CMA,  December 24, 2009 9:30 AM    Additional Follow-up for Phone Call Additional follow up Details #2::    Pt states that he is using in for hemmorhoids, he said as far as the bleeding he is just spotting.  Follow-up by: Army Fossa CMA,  December 24, 2009 10:17 AM  Additional Follow-up for Phone Call Additional follow up Details #3:: Details for Additional Follow-up Action Taken: okay to call x 1, no  rf ov if his symptoms worsen or severe Jose E. Paz MD  December 24, 2009 1:09 PM   Pt aware. Army Fossa CMA  December 24, 2009 1:55 PM   New/Updated Medications: LIDOCAINE-HYDROCORTISONE ACE 3-0.5 % CREA (LIDOCAINE-HYDROCORTISONE ACE) as needed Prescriptions: LIDOCAINE-HYDROCORTISONE ACE 3-0.5 % CREA (LIDOCAINE-HYDROCORTISONE ACE) as needed  #70 gm x 0   Entered by:   Army Fossa CMA   Authorized by:   Nolon Rod. Paz MD   Signed by:   Army Fossa CMA on 12/24/2009   Method used:   Electronically to        [facility]  [phone]* (retail)       [address]       [place], [place]  [address]       Ph: [phone] or [phone]       Fax: [phone]   RxID:   [id number]

## 2010-04-23 NOTE — Progress Notes (Signed)
Summary: -lab results  Phone Note Outgoing Call   Call placed by: [facility] CMA,  September 03, 2009 8:49 AM Details for Reason: dvised patient All labs okay except for prediabetes (sugar slightly high) but symptoms unlikely to be related to that. For now he just need to watch his sugar and carbohydrates intake Plan the same, we'll wait for the nerve conduction study Continue all meds as before Summary of Call: left message to call  ofice.................Marland KitchenFelecia Deloach CMA  September 03, 2009 8:50 AM  DISCUSS WITH PATIENT.....................Marland KitchenFelecia Deloach CMA  September 03, 2009 9:37 AM

## 2010-04-23 NOTE — Assessment & Plan Note (Signed)
Summary: pain both legs/cbs   Vital Signs:  Patient profile:   77 year old male Height:      71 inches Weight:      183 pounds Temp:     97.4 degrees F oral Pulse rate:   72 / minute BP sitting:   150 / 80  (left arm)  Vitals Entered By: Jeremy Johann CMA (August 29, 2009 1:22 PM) CC: numbness, tingling pain in both legs and tail bone x65month Comments -- sensation increase when lyiing down --refills --not fasting REVIEWED MED LIST, PATIENT AGREED DOSE AND INSTRUCTION CORRECT    History of Present Illness: 8 months history of lower extremity paresthesias The symptoms are described as "electrical impulses under my skin", they are on and off, mostly at night or when he is quiet. The located in different places : around the knees, distal to the knees, groins They don't last long or are painful.  Allergies (verified): No Known Drug Allergies  Past History:  Past Medical History: Muscle tremor, saw neurology remotely elsewhere, was Rx inderal  HYPERTRIGLYCERIDEMIA   B12 DEFICIENCY   Crohn's ileocolitis CARCINOMA, SKIN, SQUAMOUS CELL  GALLSTONES  Hemorrhoids Osteopenia per DEXA 9-10  Past Surgical History: Reviewed history from 01/29/2009 and no changes required. Resection of distal ileum and cecum with appendectomy (18-inch small intestine), 1984 for Crohn's disease  Social History: Reviewed history from 01/29/2009 and no changes required. Former Smoker Married 4 children his 86 y/o mother had a stroke , now in a NH Alcohol Use - yes one beer every 2-3 weeks Illicit Drug Use - no  Review of Systems General:  no rash in the buttocks or lower extremities  besides the medications listed in his chart, he is not taking any over-the-counter medicines No fever or weight loss. CV:  Denies chest pain or discomfort and palpitations; no  lower extremity edema No claudication per se. Neuro:  no bladder or bowel incontinence No motor deficits No back pain.  Physical  Exam  General:  alert, well-developed, and well-nourished.   Abdomen:  soft, non-tender, no distention, no masses, no guarding, and no rigidity.   Pulses:  good pedal and femoral pulses bilaterally Extremities:  no edema or rash Neurologic:  strength is symmetric Reflexes symmetric, slightly decreased at both ankles Pinprick examination of the lower extremity is essentially normal. There is few areas where the sensation is not as sharp as in others    Impression & Recommendations:  Problem # 1:  PERIPHERAL NEUROPATHY (ICD-356.9) symptoms consistent with peripheral neuropathy Plan Nerve conduction study Will do a trial with Neurontin: Watch for drowsiness, patient is also taking buspirone -----addendum, declined Rx of meds   Orders: Venipuncture (16109) T-Vitamin D (25-Hydroxy) (60454-09811) TLB-TSH (Thyroid Stimulating Hormone) (84443-TSH) TLB-Sedimentation Rate (ESR) (85652-ESR) T-RPR (Syphilis) (91478-29562) TLB-A1C / Hgb A1C (Glycohemoglobin) (83036-A1C) Misc. Referral (Misc. Ref)  Problem # 2:  B12 DEFICIENCY (ICD-266.2)  if this is not appropriately treated, it may be the cause of #1  Orders: TLB-B12 + Folate Pnl (82746_82607-B12/FOL)  Complete Medication List: 1)  Pentasa 250 Mg Cpcr (Mesalamine) .... 2 by mouth two times a day 2)  Propranolol Hcl 80 Mg Tabs (Propranolol hcl) .Marland Kitchen.. 1 by mouth two times a day 3)  Gemfibrozil 600 Mg Tabs (Gemfibrozil) .Marland Kitchen.. 1 by mouth two times a day 4)  Buspirone Hcl 15 Mg Tabs (Buspirone hcl) .Marland Kitchen.. 1 by mouth  two times a day 5)  Nascobal Gel (Cyanocobalamin gel) .... Use 1 spray per week 6)  Flax Seed Oil 1000 Mg Caps (Flaxseed (linseed)) .... Take two tabs by mouth once daily 7)  Lidocaine-hydrocortisone Ace 3-0.5 % Crea (Lidocaine-hydrocortisone ace) .... As needed 8)  Viagra 25 Mg Tabs (Sildenafil citrate) .... As needed 9)  Glucosamine 500 Mg Caps (Glucosamine sulfate) .... Take two by mouth once daily 10)  Caltrate 600+d 600-400  Mg-unit Tabs (Calcium carbonate-vitamin d) .... Take two by mouth once daily 11)  Neurontin 100 Mg Caps (Gabapentin) .... 2 by mouth at bedtime  3 days, then increase to 3 by mouth at bedtime  Patient Instructions: 1)  Please schedule a follow-up appointment in 1 month.  Prescriptions: VIAGRA 25 MG TABS (SILDENAFIL CITRATE) as needed  #15 x 3   Entered and Authorized by:   Nolon Rod. Winnie Umali MD   Signed by:   Nolon Rod. Dolphus Linch MD on 08/29/2009   Method used:   Print then Give to Patient   RxID:   [id number] NEURONTIN 100 MG CAPS (GABAPENTIN) 2 by mouth at bedtime  3 days, then increase to 3 by mouth at bedtime  #90 x 0   Entered and Authorized by:   Nolon Rod. Glennice Marcos MD   Signed by:   Nolon Rod. Georgios Kina MD on 08/29/2009   Method used:   Electronically to        [facility]  [phone]* (retail)       [address]       [place], [place]  [address]       Ph: [phone] or [phone]       Fax: [phone]   RxID:   [id number]

## 2010-04-23 NOTE — Letter (Signed)
Summary: Cancer Screening/[facility] Personalized Risk Profile  Cancer Screening/[facility] Personalized Risk Profile   Imported By: Lanelle Bal 12/14/2009 13:59:07  _____________________________________________________________________  External Attachment:    Type:   Image     Comment:   External Document

## 2010-04-23 NOTE — Assessment & Plan Note (Signed)
Summary: post hospital/crohn's Brian [name]   History of Present Illness Visit Type: follow up  Primary GI MD: Elie Goody MD [facility] Primary Provider: Willow Ora, MD Requesting Provider: n/a Chief Complaint: Hosp f/u for Crohns. Pt states that he is better and denies any GI complaints  History of Present Illness:   Brian [name] returns following hospitalization for a small bowel obstruction that resolved with conservative therapy in December. This may have been secondary to a Crohn's flare, but was more likely secondary to adhesions or a fixed stenosis near his anastomosis. He completed a short prednisone taper, without difficulty. His recent colonoscopy and upper endoscopy did not reveal evidence of Crohn's activity.   GI Review of Systems      Denies abdominal pain, acid reflux, belching, bloating, chest pain, dysphagia with liquids, dysphagia with solids, heartburn, loss of appetite, nausea, vomiting, vomiting blood, weight loss, and  weight gain.        Denies anal fissure, black tarry stools, change in bowel habit, constipation, diarrhea, diverticulosis, fecal incontinence, heme positive stool, hemorrhoids, irritable bowel syndrome, jaundice, light color stool, liver problems, rectal bleeding, and  rectal pain.   Current Medications (verified): 1)  Pentasa 250 Mg  Cpcr (Mesalamine) .... 2 By Mouth Three Times A Day 2)  Propranolol Hcl 80 Mg  Tabs (Propranolol Hcl) .Marland Kitchen.. 1 By Mouth Two Times A Day 3)  Gemfibrozil 600 Mg  Tabs (Gemfibrozil) .Marland Kitchen.. 1 By Mouth Two Times A Day 4)  Buspirone Hcl 15 Mg  Tabs (Buspirone Hcl) .Marland Kitchen.. 1 By Mouth  Two Times A Day 5)  Nascobal   Gel (Cyanocobalamin Gel) .... Use 1 Spray Per Week 6)  Flax Seed Oil 1000 Mg Caps (Flaxseed (Linseed)) .... Take Two Tabs By Mouth Once Daily 7)  Lidocaine-Hydrocortisone Ace 3-0.5 % Crea (Lidocaine-Hydrocortisone Ace) .... As Needed 8)  Viagra 25 Mg Tabs (Sildenafil Citrate) .... As Needed 9)  Glucosamine 500 Mg Caps (Glucosamine  Sulfate) .... Take Two By Mouth Once Daily 10)  Caltrate 600+d 600-400 Mg-Unit Tabs (Calcium Carbonate-Vitamin D) .... Take Two By Mouth Once Daily  Allergies (verified): No Known Drug Allergies  Past History:  Past Medical History: Reviewed history from 01/29/2009 and no changes required. Muscle tremor, saw neurology remotely elsewhere, was Rx inderal  HYPERTRIGLYCERIDEMIA   B12 DEFICIENCY   Crohn's ileocolitis CARCINOMA, SKIN, SQUAMOUS CELL  GALLSTONES  Hemorrhoids Osteopenia  Past Surgical History: Reviewed history from 01/29/2009 and no changes required. Resection of distal ileum and cecum with appendectomy (18-inch small intestine), 1984 for Crohn's disease  Family History: Reviewed history from 01/29/2009 and no changes required. [place] Family History of Crohn's: brother Family History of Breast Cancer:mother Family History of Diabetes: sister  Social History: Reviewed history from 01/29/2009 and no changes required. Former Smoker Married 4 children his 31 y/o mother had a stroke now in a NH Alcohol Use - yes one beer every 2-3 weeks Illicit Drug Use - no  Review of Systems       The pertinent positives and negatives are noted as above and in the HPI. All other ROS were reviewed and were negative.   Vital Signs:  Patient profile:   77 year old male Height:      71 inches Weight:      183 pounds BMI:     25.62 BSA:     2.03 Pulse rate:   60 / minute Pulse rhythm:   regular BP sitting:   112 / 74  (left arm) Cuff size:  regular  Vitals Entered By: Ok Anis CMA (April 04, 2009 1:52 PM)  Physical Exam  General:  Well developed, well nourished, no acute distress. Head:  Normocephalic and atraumatic. Eyes:  PERRLA, no icterus. Mouth:  No deformity or lesions, dentition normal. Lungs:  Clear throughout to auscultation. Heart:  Regular rate and rhythm; no murmurs, rubs,  or bruits. Abdomen:  Soft, nontender and nondistended. No masses,  hepatosplenomegaly or hernias noted. Normal bowel sounds. Psych:  Alert and cooperative. Normal mood and affect.  Impression & Recommendations:  Problem # 1:  CROHN'S DISEASE (ICD-555.9) Continue Pentasa 500 mg t.i.d. He has been on this dose for quite some time generally good results. If he has a recurrent bowel obstruction, consider further evaluation with a CT enterography. His recent bowel obstruction was likely secondary to a chronic stenosis in the vicinity of his anastomosis or adhesions.  Problem # 2:  GALLSTONES (ICD-574.20) Expected management.  Problem # 3:  GASTRITIS (ICD-535.50) Mild gastritis noted on endoscopy. Resume omeprazole 20 mg daily for 8 weeks and then discontinue.  Patient Instructions: 1)  Please continue current medications.  2)  Please schedule a follow-up appointment as needed.  3)  Copy sent to : Willow Ora, MD 4)  The medication list was reviewed and reconciled.  All changed / newly prescribed medications were explained.  A complete medication list was provided to the patient / caregiver.

## 2010-04-25 NOTE — Progress Notes (Signed)
Summary: question about BP readings and his BP cuff  Phone Note Call from Patient   Caller: Patient Summary of Call: patient has a new different blood pressure cuff (new from December)  and it is still reading 15 points higher --top number---than the readings that he gets at other doctors offices---wants to bring this cuff in and get reading to compare to the new cuff---Is there any reason that Dr Drue Novel can think of that would explain why his "home cuff readings" are always higher than the doctors office readings??    says he wants to schedule just a nurse visit   Initial call taken by: Jerolyn Shin,  April 04, 2010 4:13 PM  Follow-up for Phone Call        Do you have any specific recommendations about home BP cuffs? Please advise. Follow-up by: Lucious Groves CMA,  April 04, 2010 4:29 PM  Additional Follow-up for Phone Call Additional follow up Details #1::        cuff needs to be large enough to fit w/o problems. best thing is to bring cuff and compare w/ ours Jose E. Paz MD  April 05, 2010 9:19 AM     Additional Follow-up for Phone Call Additional follow up Details #2::    Patient notified and will come on Monday. Lucious Groves CMA  April 05, 2010 10:18 AM

## 2010-04-25 NOTE — Assessment & Plan Note (Signed)
Summary: BP CHECK/KB   Nurse Visit   Vital Signs:  Patient profile:   77 year old male BP sitting:   140 / 76  (left arm)  Vitals Entered By: Lucious Groves CMA (April 08, 2010 11:20 AM) CC: BP check./kb Comments Patient brought new home meter with him for check today. It read 150/76 and I could hear 140/72. Patient was advised and made aware to continue checking BP and call the office if BP remains high or he begins experiencing symptoms. Patient expressed understanding.   Allergies: No Known Drug Allergies

## 2010-04-25 NOTE — Progress Notes (Signed)
Summary: Severe rectal pain   Phone Note From Other Clinic   Caller: Renee @ Dr Drue Novel [phone] Call For: Dr Russella Dar Reason for Call: Schedule Patient Appt Summary of Call: Patient having severe rectal pain with bm's. Would like to be seen before first avail on 04-17-10. Initial call taken by: Leanor Kail [facility],  March 07, 2010 9:39 AM  Follow-up for Phone Call        Spoke with Luster Landsberg at Dr. Leta Jungling office. Scheduled patient to see Mike Gip, PA 03/08/10 @ 11am. Renee to call patient. Follow-up by: Selinda Michaels RN,  March 07, 2010 10:00 AM

## 2010-04-25 NOTE — Letter (Signed)
Summary: [facility] Instructions  [facility]  [address] [place], [place] [address]   Phone: [phone]  Fax: [phone]       Brian [name]    March 19, 1929    MRN: [id number]        Procedure Day /Date: 04-03-2010     Arrival Time :3:00 PM      Procedure Time: 4:00 PM     Location of Procedure:                    X     [facility] (4th Floor) PREPARATION FOR COLONOSCOPY WITH MOVIPREP   Starting 5 days prior to your procedure 03-29-2010  do not eat nuts, seeds, popcorn, corn, beans, peas,  salads, or any raw vegetables.  Do not take any fiber supplements (e.g. Metamucil, Citrucel, and Benefiber).  THE DAY BEFORE YOUR PROCEDURE         DATE: 04-12-2010  DAY: Tuesday  1.  Drink clear liquids the entire day-NO SOLID FOOD  2.  Do not drink anything colored red or purple.  Avoid juices with pulp.  No orange juice.  3.  Drink at least 64 oz. (8 glasses) of fluid/clear liquids during the day to prevent dehydration and help the prep work efficiently.  CLEAR LIQUIDS INCLUDE: Water Jello Ice Popsicles Tea (sugar ok, no milk/cream) Powdered fruit flavored drinks Coffee (sugar ok, no milk/cream) Gatorade Juice: apple, white grape, white cranberry  Lemonade Clear bullion, consomm, broth Carbonated beverages (any kind) Strained chicken noodle soup Hard Candy                             4.  In the morning, mix first dose of MoviPrep solution:    Empty 1 Pouch A and 1 Pouch B into the disposable container    Add lukewarm drinking water to the top line of the container. Mix to dissolve    Refrigerate (mixed solution should be used within 24 hrs)  5.  Begin drinking the prep at 5:00 p.m. The MoviPrep container is divided by 4 marks.   Every 15 minutes drink the solution down to the next mark (approximately 8 oz) until the full liter is complete.   6.  Follow completed prep with 16 oz of clear liquid of your choice (Nothing red or purple).  Continue to  drink clear liquids until bedtime.  7.  Before going to bed, mix second dose of MoviPrep solution:    Empty 1 Pouch A and 1 Pouch B into the disposable container    Add lukewarm drinking water to the top line of the container. Mix to dissolve    Refrigerate  THE DAY OF YOUR PROCEDURE      DATE: 04-03-2010 DAY: Wednesday  Beginning at 11:00 a.m. (5 hours before procedure):         1. Every 15 minutes, drink the solution down to the next mark (approx 8 oz) until the full liter is complete.  2. Follow completed prep with 16 oz. of clear liquid of your choice.    3. You may drink clear liquids until 2:00 PM  2 HOURS BEFORE PROCEDURE).   MEDICATION INSTRUCTIONS  Unless otherwise instructed, you should take regular prescription medications with a small sip of water   as early as possible the morning of your procedure.       OTHER INSTRUCTIONS  You will need a responsible adult at least  75 years of age to accompany you and drive you home.   This person must remain in the waiting room during your procedure.  Wear loose fitting clothing that is easily removed.  Leave jewelry and other valuables at home.  However, you may wish to bring a book to read or  an iPod/MP3 player to listen to music as you wait for your procedure to start.  Remove all body piercing jewelry and leave at home.  Total time from sign-in until discharge is approximately 2-3 hours.  You should go home directly after your procedure and rest.  You can resume normal activities the  day after your procedure.  The day of your procedure you should not:   Drive   Make legal decisions   Operate machinery   Drink alcohol   Return to work  You will receive specific instructions about eating, activities and medications before you leave.    The above instructions have been reviewed and explained to me by   _______________________    I fully understand and can verbalize these instructions  _____________________________ Date _________

## 2010-04-25 NOTE — Letter (Addendum)
Summary: Patient Notice- Polyp Results  [facility]  [address] [place], [place] [address]   Phone: [phone]  Fax: [phone]        April 09, 2010 MRN: [id number]    Brian [name] [address] [place], [place]  [address]    Dear [name],  I am pleased to inform you that the colon polyp removed during your recent colonoscopy was found to be benign (no cancer detected) upon pathologic examination.  I recommend you have a repeat colonoscopy examination in 5 years to look for recurrent polyps, as having colon polyps increases your risk for having recurrent polyps or even colon cancer in the future.  Should you develop new or worsening symptoms of abdominal pain, bowel habit changes or bleeding from the rectum or bowels, please schedule an evaluation with either your primary care physician or with me.  Continue treatment plan as outlined the day of your exam.  Please call us if you are having persistent problems or have questions about your condition that have not been fully answered at this time.  Sincerely,  Meryl Dare MD [facility]  This letter has been electronically signed by your physician.  Appended Document: Patient Notice- Polyp Results Letter mailed

## 2010-04-25 NOTE — Procedures (Addendum)
Summary: Colonoscopy  Patient: Brian [name] Note: All result statuses are Final unless otherwise noted.  Tests: (1) Colonoscopy (COL)   COL Colonoscopy           DONE     [facility]     [address]     [place], [place]  [address]           COLONOSCOPY PROCEDURE REPORT     PATIENT:  Brian [name], Brian [name]  MR#:  [id number]     BIRTHDATE:  September 03, 1928, 81 yrs. old  GENDER:  male     ENDOSCOPIST:  Judie Petit T. Russella Dar, MD, [facility]           PROCEDURE DATE:  04/03/2010     PROCEDURE:  Colonoscopy with biopsy and snare polypectomy     ASA CLASS:  Class II     INDICATIONS:  1) hematochezia  2) Crohn's disease     MEDICATIONS:   Fentanyl 50 mcg IV, Versed 7 mg IV     DESCRIPTION OF PROCEDURE:   After the risks benefits and     alternatives of the procedure were thoroughly explained, informed     consent was obtained.  Digital rectal exam was performed and     revealed no abnormalities.   The LB [id number]-[id number] endoscope     was introduced through the anus and advanced to the ileum, limited     by severe spasm; unable to pass scope.    The quality of the prep     was good, using MoviPrep.  The instrument was then slowly     withdrawn as the colon was fully examined.     <<PROCEDUREIMAGES>>     FINDINGS:  The portion of the right colon was surgically resected     and an ileo-colonic anastamosis was seen. A sessile polyp was     found in the proximal transverse colon. It was 4 mm in size. Polyp     was snared without cautery. Retrieval was successful. The     ascending, hepatic flexure, splenic flexure, descending, sigmoid     colon, and rectum appeared unremarkable. Random biopsies were     obtained and sent to pathology. Retroflexed views in the rectum     revealed internal hemorrhoids, small. The time to cecum =  2.25     minutes. The scope was then withdrawn (time =  8.25  min) from the     patient and the procedure completed.           COMPLICATIONS:  None           ENDOSCOPIC  IMPRESSION:     1) Prior right hemi-colectomy     2) 4 mm sessile polyp in the proximal transverse colon     3) Internal hemorrhoids           RECOMMENDATIONS:     1) Await pathology results     2) Repeat Colonoscopy in 5 years.           Venita Lick. Russella Dar, MD, [facility]           CC: Willow Ora, MD           n.     Rosalie DoctorVenita Lick. Janicia Monterrosa at 04/03/2010 03:00 PM           Blinda Leatherwood, [id number]  Note: An exclamation mark (!) indicates a result that was not dispersed into the flowsheet. Document Creation Date: 04/03/2010 3:01 PM _______________________________________________________________________  Marland Kitchen  1) Order result status: Final Collection or observation date-time: 04/03/2010 14:53 Requested date-time:  Receipt date-time:  Reported date-time:  Referring Physician:   Ordering Physician: Claudette Head [id number]) Specimen Source:  Source: Launa Grill Order Number: [id number] Lab site:   Appended Document: [facility] recall     Procedures Next Due Date:    Colonoscopy: 03/2015

## 2010-04-25 NOTE — Assessment & Plan Note (Signed)
Summary: hemmroid/cbs   Vital Signs:  Patient profile:   77 year old male Weight:      180 pounds Pulse rate:   67 / minute Pulse rhythm:   regular BP sitting:   134 / 80  (left arm) Cuff size:   large  Vitals Entered By: Army Fossa CMA (March 06, 2010 1:43 PM) CC: Pt here concerned about hemorrhoids.  Comments not fasting  [facility] [address]     History of Present Illness: history of hemorrhoids for few years He used to have discomfort maybe once a year however since September she is having pain with bowel movements much more often. He also had some bleeding 3 weeks ago  Review of systems Denies fevers No nausea, vomiting, abdominal pain  Current Medications (verified): 1)  Pentasa 250 Mg  Cpcr (Mesalamine) .... 2 By Mouth Two Times A Day 2)  Propranolol Hcl 80 Mg  Tabs (Propranolol Hcl) .Marland Kitchen.. 1 By Mouth Two Times A Day 3)  Amlodipine Besylate 2.5 Mg Tabs (Amlodipine Besylate) .Marland Kitchen.. 1 By Mouth Once Daily 4)  Gemfibrozil 600 Mg  Tabs (Gemfibrozil) .Marland Kitchen.. 1 By Mouth Two Times A Day 5)  Buspirone Hcl 15 Mg  Tabs (Buspirone Hcl) .Marland Kitchen.. 1 By Mouth  Two Times A Day 6)  Nascobal   Gel (Cyanocobalamin Gel) .... Use 1 Spray Per Week 7)  Flax Seed Oil 1000 Mg Caps (Flaxseed (Linseed)) .... Take Two Tabs By Mouth Once Daily 8)  Lidocaine-Hydrocortisone Ace 3-0.5 % Crea (Lidocaine-Hydrocortisone Ace) .... As Needed 9)  Viagra 25 Mg Tabs (Sildenafil Citrate) .... As Needed 10)  Glucosamine 500 Mg Caps (Glucosamine Sulfate) .... Take Two By Mouth Once Daily 11)  Caltrate 600+d 600-400 Mg-Unit Tabs (Calcium Carbonate-Vitamin D) .... Take Two By Mouth Once Daily 12)  Clobetasol Propionate 0.05 % Crea (Clobetasol Propionate) .... Apply To Knees Two Times A Day As Needed.  Allergies (verified): No Known Drug Allergies  Past History:  Past Medical History: Reviewed history from 02/04/2010 and no changes required. Muscle tremor, saw neurology remotely elsewhere, was Rx inderal    HYPERTRIGLYCERIDEMIA   B12 DEFICIENCY   Crohn's ileocolitis CARCINOMA, SKIN, SQUAMOUS CELL  GALLSTONES  Hemorrhoids Osteopenia per DEXA 9-10 Hypertension, mild , dx 11-11  Past Surgical History: Reviewed history from 01/29/2009 and no changes required. Resection of distal ileum and cecum with appendectomy (18-inch small intestine), 1984 for Crohn's disease  Social History: Reviewed history from 12/07/2009 and no changes required.  Married 4 children lost mother 10-11 former smoker , quit 1992 Alcohol Use - yes one beer every 2-3 weeks Illicit Drug Use - no  Physical Exam  General:  alert and well-developed.   Rectal:  external hemorrhoids noted, he has about 3 of them, no more than 1.5 cm each. They are not tender or fluctuant . No peri-rectal  cellulitis that I can tell. Digital rectal exam was quite limited due to the patient discomfort. Palpation of the anal canal cause a lot of discomfort. I saw some blood and mucus at the tip of the glove     Impression & Recommendations:  Problem # 1:  HEMATOCHEZIA (ICD-578.1)  The patient presents with severe ano- rectal pain with bowel movements. Patient thinks is his hemorrhoids however the external hemorrhoids that I saw today are not red or swollen, the pain was raher located at the anal canal. Symptoms may be related to Crohn's disease. the patient was in so much pain that I decided not to pursue a anoscopy  Plan: GI referral, colonoscopy?  see instructions    Orders: Gastroenterology Referral (GI)  Complete Medication List: 1)  Pentasa 250 Mg Cpcr (Mesalamine) .... 2 by mouth two times a day 2)  Propranolol Hcl 80 Mg Tabs (Propranolol hcl) .Marland Kitchen.. 1 by mouth two times a day 3)  Amlodipine Besylate 2.5 Mg Tabs (Amlodipine besylate) .Marland Kitchen.. 1 by mouth once daily 4)  Gemfibrozil 600 Mg Tabs (Gemfibrozil) .Marland Kitchen.. 1 by mouth two times a day 5)  Buspirone Hcl 15 Mg Tabs (Buspirone hcl) .Marland Kitchen.. 1 by mouth  two times a day 6)  Nascobal  Gel (Cyanocobalamin gel) .... Use 1 spray per week 7)  Flax Seed Oil 1000 Mg Caps (Flaxseed (linseed)) .... Take two tabs by mouth once daily 8)  Lidocaine-hydrocortisone Ace 3-0.5 % Crea (Lidocaine-hydrocortisone ace) .... As needed 9)  Viagra 25 Mg Tabs (Sildenafil citrate) .... As needed 10)  Glucosamine 500 Mg Caps (Glucosamine sulfate) .... Take two by mouth once daily 11)  Caltrate 600+d 600-400 Mg-unit Tabs (Calcium carbonate-vitamin d) .... Take two by mouth once daily 12)  Clobetasol Propionate 0.05 % Crea (Clobetasol propionate) .... Apply to knees two times a day as needed. 13)  Anusol-hc 25 Mg Supp (Hydrocortisone acetate) .Marland Kitchen.. 1 supp. pr two times a day x 5 days  Patient Instructions: 1)  sitz baths  2)  nupercainal otc as needed pain 3)  anusol suppositories two times a day x 5 days 4)  Will refer your to see the GI doctor Prescriptions: ANUSOL-HC 25 MG SUPP (HYDROCORTISONE ACETATE) 1 supp. pr two times a day x 5 days  #20 x 0   Entered and Authorized by:   Nolon Rod. Paz MD   Signed by:   Nolon Rod. Paz MD on 03/06/2010   Method used:   Print then Give to Patient   RxID:   [id number]    Orders Added: 1)  Est. Patient Level III [56213] 2)  Gastroenterology Referral [GI]

## 2010-04-25 NOTE — Assessment & Plan Note (Signed)
Summary: Rectal Pain/LRH    History of Present Illness Visit Type: Initial Consult Primary GI MD: Elie Goody MD [address] [facility] Primary Provider: Willow Ora, MD Requesting Provider: Willow Ora, MD Chief Complaint: Pt c/o since September having rectal pain, hemorrhoids, and passing some BRB at times  History of Present Illness:   VERY NICE 77 YO MALE KNOWN TO DR. Russella Dar WITH HX OF CROHNS ILEOCOLITIS. HE IS S/P  RESECTION IN 1984. HE LAST HAD COLONOSCOPY IN 12/10  WHICH SHOWED INTERNAL HEMORRHOIDS,PRIOR RIGHT HEMICOLECTOMY, AND MILD ERYTHEMA AT ANASTAMOSIS.  HE COMES IN TODAY WITH C/O 3-4 MONTH HX OF ONGOING PROBLEM WITH RECTAL PAIN AND INTERMITTENT BLEEDING.. HE SAW DR. PAZ EARLIER THIS WEEK  AND WAS TOO UNCOMFORTABLE TO BE EXAMINED OR HAVE ANOSCOPY. HE WAS STARTED ON ANUSOL HC SUPP ,BUT SAYS HE CANNOT INSERT THEM. HE SAYS HIS STOOLS HAVE BEEN THINNER FOR SOME TIME, AND HE  HAS HAD  A COUPLE EPISODES OF  INCONTINENCE OF STOOL RECENTLY WITH PASSING FLATUS. HE HAS NO ABDOMINAL PAIN, APPETITE FINE,WEIGHT STABLE. NO FEVER, NO RECTAL DRAINAGE ETC. HE SAYS HE HURTS ALL THE TIME,THE BLEEDING IS LESS PROBLEMATIC.   GI Review of Systems      Denies abdominal pain, acid reflux, belching, bloating, chest pain, dysphagia with liquids, dysphagia with solids, heartburn, loss of appetite, nausea, vomiting, vomiting blood, weight loss, and  weight gain.      Reports hemorrhoids, rectal bleeding, and  rectal pain.     Denies anal fissure, black tarry stools, change in bowel habit, constipation, diarrhea, diverticulosis, fecal incontinence, heme positive stool, irritable bowel syndrome, jaundice, light color stool, and  liver problems.    Current Medications (verified): 1)  Pentasa 250 Mg  Cpcr (Mesalamine) .... 2 By Mouth Two Times A Day 2)  Propranolol Hcl 80 Mg  Tabs (Propranolol Hcl) .Marland Kitchen.. 1 By Mouth Two Times A Day 3)  Amlodipine Besylate 2.5 Mg Tabs (Amlodipine Besylate) .Marland Kitchen.. 1 By Mouth Once Daily 4)   Gemfibrozil 600 Mg  Tabs (Gemfibrozil) .Marland Kitchen.. 1 By Mouth Two Times A Day 5)  Buspirone Hcl 15 Mg  Tabs (Buspirone Hcl) .Marland Kitchen.. 1 By Mouth  Two Times A Day 6)  Nascobal   Gel (Cyanocobalamin Gel) .... Use 1 Spray Per Week 7)  Flax Seed Oil 1000 Mg Caps (Flaxseed (Linseed)) .... Take Two Tabs By Mouth Once Daily 8)  Lidocaine-Hydrocortisone Ace 3-0.5 % Crea (Lidocaine-Hydrocortisone Ace) .... As Needed 9)  Viagra 25 Mg Tabs (Sildenafil Citrate) .... As Needed 10)  Caltrate 600+d 600-400 Mg-Unit Tabs (Calcium Carbonate-Vitamin D) .... Take Two By Mouth Once Daily 11)  Clobetasol Propionate 0.05 % Crea (Clobetasol Propionate) .... Apply To Knees Two Times A Day As Needed. 12)  Anusol-Hc 25 Mg Supp (Hydrocortisone Acetate) .Marland Kitchen.. 1 Supp. Pr Two Times A Day X 5 Days  Allergies (verified): No Known Drug Allergies  Past History:  Past Medical History: Muscle tremor, saw neurology remotely elsewhere, was Rx inderal  Crohn's ileocolitis Osteopenia per DEXA 9-10 Hypertension, mild , dx 11-11 HEMORRHOIDS (ICD-455.6)-INT/EXT  HYPERTROPHY PROSTATE W/O UR OBST & OTH LUTS (ICD-600.00) OSTEOPENIA (ICD-733.90) PERIPHERAL NEUROPATHY (ICD-356.9) GASTRITIS (ICD-535.50)  OSTEOARTHRITIS, ANKLE, RIGHT (ICD-715.97) ERECTILE DYSFUNCTION (ICD-302.72) TREMOR (ICD-781.0)  HYPERTRIGLYCERIDEMIA (ICD-272.1) B12 DEFICIENCY (ICD-266.2)   CARCINOMA, SKIN, SQUAMOUS CELL (ICD-173.9) GALLSTONES (ICD-574.20)  Past Surgical History: Reviewed history from 01/29/2009 and no changes required. Resection of distal ileum and cecum with appendectomy (18-inch small intestine), 1984 for Crohn's disease  Family History: Reviewed history from 12/07/2009 and no changes  required. Crohn's: brother Breast Cancer:mother colon ca--no dementia-- S Diabetes: sister MI-- F age 22   Social History: Retired  Married 4 children lost mother 10-11 former smoker , quit 1992 Alcohol Use - yes one beer every 2-3 weeks Illicit Drug  Use - no  Review of Systems  The patient denies allergy/sinus, anemia, anxiety-new, arthritis/joint pain, back pain, blood in urine, breast changes/lumps, change in vision, confusion, cough, coughing up blood, depression-new, fainting, fatigue, fever, headaches-new, hearing problems, heart murmur, heart rhythm changes, itching, menstrual pain, muscle pains/cramps, night sweats, nosebleeds, pregnancy symptoms, shortness of breath, skin rash, sleeping problems, sore throat, swelling of feet/legs, swollen lymph glands, thirst - excessive , urination - excessive , urination changes/pain, urine leakage, vision changes, and voice change.         SEE HPI  Vital Signs:  Patient profile:   77 year old male Height:      71 inches Weight:      178 pounds BMI:     24.92 BSA:     2.01 Pulse rate:   68 / minute Pulse rhythm:   regular BP sitting:   132 / 76  (left arm) Cuff size:   regular  Vitals Entered By: Ok Anis CMA (March 08, 2010 10:50 AM)  Physical Exam  General:  Well developed, well nourished, no acute distress. Head:  Normocephalic and atraumatic. Eyes:  PERRLA, no icterus. Lungs:  Clear throughout to auscultation. Heart:  Regular rate and rhythm; no murmurs, rubs,  or bruits. Abdomen:  SOFT, MINIMALLY TENDER RLQ, NO GUARDING, NO MASS OR HSM,BS+ Rectal:  EXTERNAL EXAM ONLY-LARGE EXTERNAL HEMORRHOIDS,NONTHROMBOSED. NO OBVIOUS FISTULA, NO EVIDENCE FOR ABSCESS Extremities:  No clubbing, cyanosis, edema or deformities noted. Neurologic:  Alert and  oriented x4;  grossly normal neurologically. Psych:  Alert and cooperative. Normal mood and affect.   Impression & Recommendations:  Problem # 1:  RECTAL PAIN Assessment Deteriorated 77 YO MALE WITH HX CROHNS ILEOCOLITIS, HX OF INTERNAL HEMORRHOIDS -WITH C/O CHANGE IN CALIBER OF STOOL X 3-4 MONTHS, PERSISTENT INTERNAL RECATL PAIN, AND INTERMITTENT RECTAL BLEEDING. SXS MAY ALL BE HEMORRHOIDAL BUT NEED TO R/O CROHNS INVOLVEMENT,OCCULT  LESION  CBC,CRP TODAY ANAMANTLE HC 2.5/3%  3  X DAILY ,INTO RECTUM SCHEDULE FOR COLONOSCOPY WITH DR. Russella Dar, TOASSESS AND FOR POSSIBLE INJECTION  THERAPY OF INTERNAL HEMORRHOIDS IF INDICATED.PROCEDURE DISCUSSED IN DETAIL WITH PT ,INCLUDING RISKS/BENEFITS  Problem # 2:  CROHN'S DISEASE-LARGE & SMALL INTESTINE (ICD-555.2) Assessment: Comment Only ILEOCOLITIS, S/P REMOTE RESECTION,   MAINTAINED ON LOW DOSE PENTASA  Problem # 3:  CHOLELITHIASIS (ICD-574.2) Assessment: Comment Only  Problem # 4:  HYPERTENSION (ICD-401.9) Assessment: Comment Only  Other Orders: Colonoscopy (Colon) Colonoscopy (Colon) TLB-CBC Platelet - w/Differential (85025-CBCD) TLB-CRP-High Sensitivity (C-Reactive Protein) (86140-FCRP) Prescriptions: ANALPRAM E 2.5-1 & 1 % KIT (HYDROCORTISONE ACE-PRAMOXINE) Use 2-3 times daily for hemorrhoids  #1 kit x 4   Entered by:   Lowry Ram NCMA   Authorized by:   Sammuel Cooper PA-c   Signed by:   Lowry Ram NCMA on 03/08/2010   Method used:   Electronically to        [facility] [phone]* (retail)       [address]       [place], [place]  [address]       Ph: [phone] or [phone]       Fax: [phone]   RxID:   [id number] MOVIPREP 100 GM  SOLR (PEG-KCL-NACL-NASULF-NA ASC-C) As per prep instructions.  #1 x 0  Entered by:   Lowry Ram NCMA   Authorized by:   Sammuel Cooper PA-c   Signed by:   Lowry Ram NCMA on 03/08/2010   Method used:   Electronically to        [facility] [phone]* (retail)       [address]       [place], [place]  [address]       Ph: [phone] or [phone]       Fax: [phone]   RxID:   [id number] MOVIPREP 100 GM  SOLR (PEG-KCL-NACL-NASULF-NA ASC-C) As per prep instructions.  #1 x 0   Entered by:   Lowry Ram NCMA   Authorized by:   Sammuel Cooper PA-c   Signed by:   Lowry Ram NCMA on 03/08/2010   Method used:   Electronically to        [facility]  [phone]* (retail)       [address]       [name], [place]  [address]       Ph: [phone] or [phone]       Fax: [phone]   RxID:   [id number]  Pt changed his mind on the pharmacy.[facility]

## 2010-04-25 NOTE — Progress Notes (Signed)
Summary: RX  Phone Note Refill Request   Refills Requested: Medication #1:  AMLODIPINE BESYLATE 2.5 MG TABS 1 by mouth once daily   Dosage confirmed as above?Dosage Confirmed   Supply Requested: 3 months PLEASE FAX TO [facility]--[phone]  Initial call taken by: Freddy Jaksch,  April 08, 2010 11:42 AM    Prescriptions: AMLODIPINE BESYLATE 2.5 MG TABS (AMLODIPINE BESYLATE) 1 by mouth once daily  #90 x 0   Entered by:   Army Fossa CMA   Authorized by:   Nolon Rod. Paz MD   Signed by:   Army Fossa CMA on 04/08/2010   Method used:   Electronically to        [facility]* (retail)             ,          Ph: [phone]       Fax: [phone]   RxID:   [id number]

## 2010-06-20 ENCOUNTER — Other Ambulatory Visit: Payer: Self-pay | Admitting: Internal Medicine

## 2010-06-20 NOTE — Telephone Encounter (Signed)
Ok #20, no RF 

## 2010-06-25 LAB — CBC
HCT: 38.2 % — ABNORMAL LOW (ref 39.0–52.0)
HCT: 41 % (ref 39.0–52.0)
HCT: 42.4 % (ref 39.0–52.0)
Hemoglobin: 14.2 g/dL (ref 13.0–17.0)
Hemoglobin: 14.4 g/dL (ref 13.0–17.0)
MCHC: 34 g/dL (ref 30.0–36.0)
MCHC: 34.3 g/dL (ref 30.0–36.0)
MCHC: 34.3 g/dL (ref 30.0–36.0)
MCV: 93.3 fL (ref 78.0–100.0)
MCV: 93.8 fL (ref 78.0–100.0)
MCV: 94.2 fL (ref 78.0–100.0)
MCV: 94.3 fL (ref 78.0–100.0)
Platelets: 166 10*3/uL (ref 150–400)
Platelets: 173 10*3/uL (ref 150–400)
Platelets: 181 10*3/uL (ref 150–400)
RBC: 4.52 MIL/uL (ref 4.22–5.81)
RBC: 4.75 MIL/uL (ref 4.22–5.81)
RDW: 13 % (ref 11.5–15.5)
RDW: 13.1 % (ref 11.5–15.5)
RDW: 13.4 % (ref 11.5–15.5)
WBC: 10 10*3/uL (ref 4.0–10.5)
WBC: 9.8 10*3/uL (ref 4.0–10.5)

## 2010-06-25 LAB — POCT I-STAT, CHEM 8
BUN: 15 mg/dL (ref 6–23)
Chloride: 103 mEq/L (ref 96–112)
Creatinine, Ser: 0.8 mg/dL (ref 0.4–1.5)
Sodium: 140 mEq/L (ref 135–145)

## 2010-06-25 LAB — URINALYSIS, ROUTINE W REFLEX MICROSCOPIC
Bilirubin Urine: NEGATIVE
Hgb urine dipstick: NEGATIVE
Ketones, ur: NEGATIVE mg/dL
Nitrite: NEGATIVE
Protein, ur: NEGATIVE mg/dL
Specific Gravity, Urine: 1.023 (ref 1.005–1.030)
Urobilinogen, UA: 0.2 mg/dL (ref 0.0–1.0)

## 2010-06-25 LAB — BASIC METABOLIC PANEL
BUN: 11 mg/dL (ref 6–23)
BUN: 15 mg/dL (ref 6–23)
BUN: 17 mg/dL (ref 6–23)
BUN: 19 mg/dL (ref 6–23)
CO2: 26 mEq/L (ref 19–32)
Chloride: 101 mEq/L (ref 96–112)
Chloride: 104 mEq/L (ref 96–112)
Chloride: 110 mEq/L (ref 96–112)
Creatinine, Ser: 0.91 mg/dL (ref 0.4–1.5)
Creatinine, Ser: 0.92 mg/dL (ref 0.4–1.5)
Glucose, Bld: 113 mg/dL — ABNORMAL HIGH (ref 70–99)
Glucose, Bld: 127 mg/dL — ABNORMAL HIGH (ref 70–99)
Glucose, Bld: 89 mg/dL (ref 70–99)
Glucose, Bld: 93 mg/dL (ref 70–99)
Potassium: 3.4 mEq/L — ABNORMAL LOW (ref 3.5–5.1)
Potassium: 3.7 mEq/L (ref 3.5–5.1)
Potassium: 3.7 mEq/L (ref 3.5–5.1)
Potassium: 3.8 mEq/L (ref 3.5–5.1)
Sodium: 136 mEq/L (ref 135–145)
Sodium: 137 mEq/L (ref 135–145)

## 2010-06-25 LAB — COMPREHENSIVE METABOLIC PANEL
ALT: 15 U/L (ref 0–53)
Albumin: 3.7 g/dL (ref 3.5–5.2)
Alkaline Phosphatase: 76 U/L (ref 39–117)
Calcium: 9.2 mg/dL (ref 8.4–10.5)
Glucose, Bld: 137 mg/dL — ABNORMAL HIGH (ref 70–99)
Potassium: 4.3 mEq/L (ref 3.5–5.1)
Sodium: 134 mEq/L — ABNORMAL LOW (ref 135–145)
Total Protein: 7.4 g/dL (ref 6.0–8.3)

## 2010-06-25 LAB — DIFFERENTIAL
Basophils Relative: 0 % (ref 0–1)
Eosinophils Absolute: 0 10*3/uL (ref 0.0–0.7)
Eosinophils Relative: 0 % (ref 0–5)
Eosinophils Relative: 0 % (ref 0–5)
Lymphocytes Relative: 11 % — ABNORMAL LOW (ref 12–46)
Lymphs Abs: 1.2 10*3/uL (ref 0.7–4.0)
Monocytes Absolute: 0.6 10*3/uL (ref 0.1–1.0)
Monocytes Relative: 3 % (ref 3–12)
Monocytes Relative: 6 % (ref 3–12)

## 2010-06-25 LAB — LIPASE, BLOOD: Lipase: 29 U/L (ref 11–59)

## 2010-06-25 LAB — SEDIMENTATION RATE: Sed Rate: 27 mm/hr — ABNORMAL HIGH (ref 0–16)

## 2010-06-25 LAB — POTASSIUM: Potassium: 4.4 mEq/L (ref 3.5–5.1)

## 2010-07-22 ENCOUNTER — Other Ambulatory Visit: Payer: Self-pay | Admitting: Internal Medicine

## 2010-08-09 NOTE — Assessment & Plan Note (Signed)
[facility]                           GASTROENTEROLOGY OFFICE NOTE   ZENITH, LAMPHIER                        MRN:          [id number]  DATE:02/11/2006                            DOB:          1928/12/09    HISTORY:  Mr. Renz is a 77 year old white male with a history of Crohn's  ileocolitis, status post small bowel resection in 1983.  His last  colonoscopy was in March 2003.  He states he has done quite well up until  about four weeks ago, when he developed a change in bowel habits, with  frequent firm bowel movements, which lead to significant anal pain, and he  presumed he had hemorrhoidal symptoms.  He used Analpram cream, Sitz baths  and Kaopectate and all of his symptoms resolved.  His bowel habits have  returned to normal and his anorectal pain has abated.  He has had no weight  loss, fevers, chills, nausea, vomiting or abdominal pain.   CURRENT MEDICATIONS:  Listed on the chart.  Updated and reviewed.   ALLERGIES:  No known drug allergies.   PAST MEDICAL HISTORY:  1. Crohn's disease involving the ileocecal region.  Status post small      bowel resection in 1983.  2. Cholelithiasis, asymptomatic.  3. Hyperlipidemia.  4. Benign tremor.  5. Status post appendectomy in 1983.   SOCIAL HISTORY/REVIEW OF SYSTEMS:  Per the handwritten form.   PHYSICAL EXAMINATION:  GENERAL:  A well-developed and well-nourished white  male, in no acute distress.  VITAL SIGNS:  Height 5 feet 11 inches, weight 192 pounds.  Blood pressure  140/86, pulse 48 and regular.  HEENT:  Anicteric sclerae.  Oropharynx clear.  CHEST:  Clear to auscultation bilaterally.  HEART:  A regular rate and rhythm without murmurs.  ABDOMEN:  Soft, with minimal right lower quadrant tenderness to deep  palpation.  No rebound or guarding.  No palpable organomegaly, masses or  herniae.  Well-healed right lower quadrant surgical incision.  RECTAL:  Examination deferred to time of  colonoscopy.  NEUROLOGIC:  Alert and oriented x3.  Grossly nonfocal.   ASSESSMENT:  Ileocecal Crohn's disease with a recent change in bowel habits  and recent anorectal pain.  Rule out a flare of Crohn's disease.  Continue  Pentasa at current dosage.  Obtain a CBC, CMET, erythrocyte sedimentation  rate and B12 level today.  The risks, benefits and alternatives to  colonoscopy, with possible biopsy and possible polypectomy discussed with  the patient and he consents to proceed.  This will be scheduled electively.  He is advised to discontinue Kaopectate and increase his fiber and fluid  intake.     Venita Lick. Russella Dar, MD, [facility]  Electronically Signed    MTS/MedQ  DD: 02/11/2006  DT: 02/11/2006  Job #: [id number]   cc:   Loreen Freud, M.D.

## 2010-09-03 ENCOUNTER — Encounter: Payer: Self-pay | Admitting: Internal Medicine

## 2010-09-03 ENCOUNTER — Ambulatory Visit (INDEPENDENT_AMBULATORY_CARE_PROVIDER_SITE_OTHER): Payer: Medicare Other | Admitting: Internal Medicine

## 2010-09-03 DIAGNOSIS — K5090 Crohn's disease, unspecified, without complications: Secondary | ICD-10-CM

## 2010-09-03 DIAGNOSIS — R0600 Dyspnea, unspecified: Secondary | ICD-10-CM | POA: Insufficient documentation

## 2010-09-03 DIAGNOSIS — I10 Essential (primary) hypertension: Secondary | ICD-10-CM

## 2010-09-03 DIAGNOSIS — J449 Chronic obstructive pulmonary disease, unspecified: Secondary | ICD-10-CM

## 2010-09-03 NOTE — Assessment & Plan Note (Signed)
Well-controlled, no change 

## 2010-09-03 NOTE — Assessment & Plan Note (Signed)
Question of COPD in the past Mild shortness of breath. Plan: Spirometry Chest x-ray Reassess and return to the office

## 2010-09-03 NOTE — Progress Notes (Signed)
  Subjective:    Patient ID: Brian [name], male    DOB: [birth year], 77 y.o.   MRN: [id number]  HPI Routine office visit, doing well. He has noted some shortness of breath described actually as "needing to breathe harder" than before to accomplish the same tasks like doing yardwork or going upstairs. Symptoms were noted first about a year ago, they gradually appear appeared He quit tobacco about 20 years ago and at some point he was told he has early COPD.    Past Medical History  Diagnosis Date  . Muscle tremor     saw neurology remotely elsewhere, was Rx inderal  . Crohn's ileocolitis   . Osteopenia     per DEXA 11/2008  . Hypertension 11/11    mild  . Hemorrhoids   . Hypertrophy of prostate     w/o UR obst & oth luts  . Osteopenia   . Peripheral neuropathy   . Osteoarthritis     ankle,right  . ED (erectile dysfunction)   . Tremor   . Hypertriglyceridemia   . B12 deficiency   . Carcinoma     skin, squamous cell  . Gallstones    Review of Systems Medication compliance with BP meds, Atarax blood pressure 142/71. Good medication compliance with cholesterol medicine and B12. Denies chest pain, cough, wheezing. No lower extremity edema, orthopnea or paroxysmal nocturnal dyspnea.    Objective:   Physical Exam  Constitutional: He is oriented to person, place, and time. He appears well-developed and well-nourished. No distress.  HENT:  Head: Normocephalic and atraumatic.  Neck: No JVD present.  Cardiovascular: Normal rate, regular rhythm and normal heart sounds.   No murmur heard. Pulmonary/Chest: Effort normal and breath sounds normal. No respiratory distress. He has no wheezes.  Musculoskeletal: He exhibits no edema.  Neurological: He is alert and oriented to person, place, and time.  Skin: He is not diaphoretic.          Assessment & Plan:

## 2010-09-03 NOTE — Assessment & Plan Note (Signed)
Had a colonoscopy  1-201 2  Due to rectal discomfort, it showed a polyp, repeat colonoscopy 2017

## 2010-09-04 ENCOUNTER — Ambulatory Visit (INDEPENDENT_AMBULATORY_CARE_PROVIDER_SITE_OTHER)
Admission: RE | Admit: 2010-09-04 | Discharge: 2010-09-04 | Disposition: A | Discharge status: Home / Self Care | Payer: Medicare Other | Source: Ambulatory Visit | Attending: Internal Medicine | Admitting: Internal Medicine

## 2010-09-04 DIAGNOSIS — J449 Chronic obstructive pulmonary disease, unspecified: Secondary | ICD-10-CM

## 2010-09-04 DIAGNOSIS — J4489 Other specified chronic obstructive pulmonary disease: Secondary | ICD-10-CM

## 2010-09-10 ENCOUNTER — Telehealth: Payer: Self-pay | Admitting: *Deleted

## 2010-09-10 NOTE — Telephone Encounter (Signed)
Pt dropped off letter stating he was had an OV on June 12th, COPD was discussed.He is going on a family vacation to the beach next week- he would like to know if he should get a script for a mild inhalant for COPD just in case. Please advise.  Pharm- [facility].

## 2010-09-11 NOTE — Telephone Encounter (Signed)
No need for inhalers unless he has cough or wheezing

## 2010-09-12 NOTE — Telephone Encounter (Signed)
Pt states he is not having an wheezing or coughing, he is aware he does not need any inhalers.

## 2010-09-30 ENCOUNTER — Other Ambulatory Visit: Payer: Self-pay | Admitting: Internal Medicine

## 2010-09-30 NOTE — Telephone Encounter (Signed)
Ok x 1

## 2010-10-01 ENCOUNTER — Other Ambulatory Visit: Payer: Self-pay | Admitting: Internal Medicine

## 2010-10-08 ENCOUNTER — Telehealth: Payer: Self-pay | Admitting: *Deleted

## 2010-10-08 NOTE — Telephone Encounter (Signed)
Message left for patient to return my call.  

## 2010-10-08 NOTE — Telephone Encounter (Signed)
Pt left message noting that he has been dx with COPD. He would like to know when he should follow up here or should he go to a specialist? And if so which one, or will our office be making a referral? Please advise.

## 2010-10-08 NOTE — Telephone Encounter (Signed)
rec to keep f/u by 12-2010, will see how he is doing then

## 2010-10-09 NOTE — Telephone Encounter (Signed)
Pt is aware.  

## 2010-10-14 ENCOUNTER — Other Ambulatory Visit: Payer: Self-pay | Admitting: Internal Medicine

## 2010-11-03 ENCOUNTER — Other Ambulatory Visit: Payer: Self-pay | Admitting: Internal Medicine

## 2010-11-04 NOTE — Telephone Encounter (Signed)
Buspar request [last refill 12/07/09 #[id number]]

## 2010-11-06 NOTE — Telephone Encounter (Signed)
Rx Done . 

## 2010-11-06 NOTE — Telephone Encounter (Signed)
180 and 1 RF

## 2010-12-23 ENCOUNTER — Encounter: Payer: Self-pay | Admitting: Internal Medicine

## 2010-12-23 ENCOUNTER — Ambulatory Visit (INDEPENDENT_AMBULATORY_CARE_PROVIDER_SITE_OTHER): Payer: Medicare Other | Admitting: Internal Medicine

## 2010-12-23 DIAGNOSIS — R7309 Other abnormal glucose: Secondary | ICD-10-CM

## 2010-12-23 DIAGNOSIS — R739 Hyperglycemia, unspecified: Secondary | ICD-10-CM

## 2010-12-23 DIAGNOSIS — M899 Disorder of bone, unspecified: Secondary | ICD-10-CM

## 2010-12-23 DIAGNOSIS — N400 Enlarged prostate without lower urinary tract symptoms: Secondary | ICD-10-CM

## 2010-12-23 DIAGNOSIS — J449 Chronic obstructive pulmonary disease, unspecified: Secondary | ICD-10-CM

## 2010-12-23 DIAGNOSIS — Z0000 Encounter for general adult medical examination without abnormal findings: Secondary | ICD-10-CM | POA: Insufficient documentation

## 2010-12-23 DIAGNOSIS — E538 Deficiency of other specified B group vitamins: Secondary | ICD-10-CM

## 2010-12-23 DIAGNOSIS — I10 Essential (primary) hypertension: Secondary | ICD-10-CM

## 2010-12-23 DIAGNOSIS — E781 Pure hyperglyceridemia: Secondary | ICD-10-CM

## 2010-12-23 DIAGNOSIS — Z23 Encounter for immunization: Secondary | ICD-10-CM

## 2010-12-23 LAB — VITAMIN B12: Vitamin B-12: 559 pg/mL (ref 211–911)

## 2010-12-23 LAB — CBC WITH DIFFERENTIAL/PLATELET
Basophils Absolute: 0 10*3/uL (ref 0.0–0.1)
Eosinophils Absolute: 0.2 10*3/uL (ref 0.0–0.7)
Lymphocytes Relative: 21.1 % (ref 12.0–46.0)
MCHC: 33.5 g/dL (ref 30.0–36.0)
Monocytes Absolute: 0.6 10*3/uL (ref 0.1–1.0)
Neutro Abs: 5.4 10*3/uL (ref 1.4–7.7)
Neutrophils Relative %: 69.1 % (ref 43.0–77.0)
RDW: 14 % (ref 11.5–14.6)

## 2010-12-23 LAB — LIPID PANEL
HDL: 33.9 mg/dL — ABNORMAL LOW (ref >39.00)
LDL Cholesterol: 77 mg/dL (ref 0–99)
Total CHOL/HDL Ratio: 4
Triglycerides: 97 mg/dL (ref 0.0–149.0)

## 2010-12-23 LAB — BASIC METABOLIC PANEL
BUN: 14 mg/dL (ref 6–23)
Creatinine, Ser: 1 mg/dL (ref 0.4–1.5)
Glucose, Bld: 103 mg/dL — ABNORMAL HIGH (ref 70–99)

## 2010-12-23 NOTE — Assessment & Plan Note (Addendum)
chart reviewed Td 2000, 2012, Pneumonia shot 2009, flu shot--states will get it later this year Shingles immunization discussed colonoscopy in 2007, 04/2008, 03-2010 (next 5 years)  Prostate is slightly enlarged, check a PSA Mild hyperglycemia, check a A1C  diet and exercise discussed

## 2010-12-23 NOTE — Assessment & Plan Note (Signed)
Good compliance with medicines, labs   

## 2010-12-23 NOTE — Assessment & Plan Note (Signed)
At goal, labs  

## 2010-12-23 NOTE — Assessment & Plan Note (Signed)
Asx, labs  

## 2010-12-23 NOTE — Assessment & Plan Note (Signed)
Good med compliance , labs  

## 2010-12-23 NOTE — Progress Notes (Signed)
Subjective:    Patient ID: Brian [name], male    DOB: [birth year], 77 y.o.   MRN: [id number]  HPI Here for Medicare AWV: 1. Risk factors based on Past M, S, F history: yes  2. Physical Activities: not very active lately ,no change since last year 3. Depression/mood: denies , no problems noted  4. Hearing: decreased a little, offered referral to an audiologist---declined  5. ADL's: totally independent , still drives  6. Fall Risk: no recent falls, low risk  7. Home Safety: does feels afe at home  8. Height, weight, &visual acuity: see VS, vision good w/  correction  9. Counseling: yes , see below  10. Labs ordered based on risk factors:  yes  11.           Referral Coordination: if needed  12.           Care Plan: see a/p  13.            Cognitive Assessment --motor skills , memory and cognition seemed  appropriate  In addition, we discussed the following issues neuropathy-- symptoms not enough for him to take meds  Muscle tremor, on inderal  well controlled  osteoipenia-- on Ca and Vit D  B12 DEFICIENCY -- good medication compliance w/ nascobal   Crohn's-- quiet for now, no symptoms    Past Medical History  Diagnosis Date  . Muscle tremor     saw neurology remotely elsewhere, was Rx inderal  . Crohn's ileocolitis   . Osteopenia     per DEXA 11/2008  . Hypertension 11/11    mild  . Hemorrhoids   . Hypertrophy of prostate     w/o UR obst & oth luts  . Peripheral neuropathy     h/o neg w/u  . Osteoarthritis     ankle,right  . ED (erectile dysfunction)   . Tremor   . Hypertriglyceridemia   . B12 deficiency   . Carcinoma     skin, squamous cell  . Gallstones     Past Surgical History  Procedure Date  . Resection of distal ileum and cecum w/ appendectomy     18-inch small intestine, 1984 for Crohn's disease   Family History  Problem Relation Age of Onset  . Crohn's disease Brother   . Breast cancer Mother   . Colon cancer Neg Hx   . Heart attack Father 52  .  Dementia Sister   . Diabetes Sister   . Prostate cancer Neg Hx    History   Social History  . Marital Status: Married    Spouse Name: N/A    Number of Children: 4  . Years of Education: N/A   Occupational History  . Retired    Social History Main Topics  . Smoking status: Former Smoker    Quit date: 12/23/1995  . Smokeless tobacco: Never Used  . Alcohol Use: 0.0 oz/week     1 beer every 2-3 weeks   . Drug Use: No  . Sexually Active: Not on file   Other Topics Concern  . Not on file   Social History Narrative   Married, lives w/ wife      Review of Systems  Constitutional: Negative for fever and fatigue.  Respiratory: Negative for cough and wheezing. Shortness of breath: occ sob.        No sputum  Cardiovascular: Negative for chest pain and leg swelling.  Gastrointestinal: Negative for diarrhea and abdominal distention. Blood in stool: occ  red blood per rectum, thinks d/t hemorrhoids   Genitourinary: Negative for dysuria, hematuria and difficulty urinating.       Feeling of incomplete empty bladder at times        Objective:   Physical Exam  Constitutional: He is oriented to person, place, and time. He appears well-developed and well-nourished.  HENT:  Head: Normocephalic and atraumatic.  Neck: No thyromegaly present.       Normal carotid  pulse  Cardiovascular: Normal rate, regular rhythm and normal heart sounds.   No murmur heard. Pulmonary/Chest:       Slightly decreased breath sounds otherwise normal  Abdominal: Soft. He exhibits no distension. There is no tenderness. There is no rebound and no guarding.  Genitourinary:        External hemorrhoids, no bleeding, rectum normal. Prostate slightly enlarged, not tender or nodular  Musculoskeletal: He exhibits no edema.  Neurological: He is alert and oriented to person, place, and time.  Skin: Skin is warm and dry.  Psychiatric: He has a normal mood and affect. His behavior is normal. Judgment and thought  content normal.      Assessment & Plan:

## 2010-12-23 NOTE — Assessment & Plan Note (Addendum)
osteopenia her bone density test 11/2008 Was recommended calcium and vitamin D declined  Fosamax Plan: DEXA

## 2010-12-23 NOTE — Assessment & Plan Note (Addendum)
Patient is a former smoker, occasionally shortness of breath, spirometry 6-12 showed more restriction than obstruction. Plan: Refer to pulmonary (I was expecting  more obstruction on spirometry)

## 2010-12-27 NOTE — Progress Notes (Signed)
Quick Note:    Labs mailed.  ______

## 2010-12-30 ENCOUNTER — Other Ambulatory Visit: Payer: Self-pay | Admitting: Internal Medicine

## 2010-12-30 NOTE — Telephone Encounter (Signed)
Done

## 2011-01-06 ENCOUNTER — Other Ambulatory Visit: Payer: Self-pay | Admitting: Internal Medicine

## 2011-01-06 ENCOUNTER — Telehealth: Payer: Self-pay | Admitting: Internal Medicine

## 2011-01-06 NOTE — Telephone Encounter (Signed)
Done

## 2011-01-06 NOTE — Telephone Encounter (Signed)
Per my call to inform patient of his Bone Density appointment, patient told me to cancel.  He states he doesn't need one, he thinks he is just fine.  I cancelled per his request.

## 2011-01-06 NOTE — Telephone Encounter (Signed)
Noted  

## 2011-01-07 ENCOUNTER — Other Ambulatory Visit: Payer: Medicare Other

## 2011-01-07 ENCOUNTER — Encounter: Payer: Self-pay | Admitting: Internal Medicine

## 2011-01-07 ENCOUNTER — Ambulatory Visit (INDEPENDENT_AMBULATORY_CARE_PROVIDER_SITE_OTHER): Payer: Medicare Other | Admitting: Internal Medicine

## 2011-01-07 DIAGNOSIS — I10 Essential (primary) hypertension: Secondary | ICD-10-CM

## 2011-01-07 DIAGNOSIS — J449 Chronic obstructive pulmonary disease, unspecified: Secondary | ICD-10-CM

## 2011-01-07 NOTE — Assessment & Plan Note (Addendum)
As I explained to this patient in detail:  although there may be a little copd present,  it does not appear to be limiting activity tolerance any more than a set of worn tires limits someone from driving a car  around a parking lot.  A new set of Michelins might look good but would have no perceived impact on the performance of the car and would not be worth the cost.  That is to say:   I don't recommend aggressive pulmonary rx at this point unless limiting symptoms arise or acute exacerbations become as issue, neither of which are the case now.  I asked the patient to contact this office at any time in the future should either of these problems arise.    The fact that he is able to tolerate inderal is against asthmatic component   His cxr does not suggest a significant restrictive component so I do not recommend repeat pft's/ lung volumes in this setting

## 2011-01-07 NOTE — Progress Notes (Signed)
  Subjective:    Patient ID: Brian [name], male    DOB: January 03, 1929, 76 y.o.   MRN: [id number]  HPI  89 yowm quit smoking 1990  with dx of copd by screening pft's but he was not aware of any limitations until around 2006 with exertion minimally worse since then  01/07/2011 Brian [name]/  Initial pulmonary office eval cc doe x exertion x 5 years, not progressing, not bothering sleep, no am cough or tendency to flares despite longterm maint on inderal for tremors.    Sleeping ok without nocturnal  or early am exacerbation  of respiratory  C/o's   . Also denies any obvious fluctuation of symptoms with weather or environmental changes or other aggravating or alleviating factors except as outlined above   Review of Systems  Constitutional: Negative for fever, chills, activity change, appetite change and unexpected weight change.  HENT: Positive for congestion. Negative for sore throat, rhinorrhea, sneezing, trouble swallowing, dental problem, voice change and postnasal drip.   Eyes: Negative for visual disturbance.  Respiratory: Positive for shortness of breath. Negative for cough and choking.   Cardiovascular: Negative for chest pain and leg swelling.  Gastrointestinal: Negative for nausea, vomiting and abdominal pain.  Genitourinary: Negative for difficulty urinating.  Musculoskeletal: Negative for arthralgias.  Skin: Negative for rash.  Psychiatric/Behavioral: Negative for behavioral problems and confusion.       Objective:   Physical Exam Edentulous wm nad minimally hoarse HEENT mild turbinate edema.  Oropharynx no thrush or excess pnd or cobblestoning.  No JVD or cervical adenopathy. Mild accessory muscle hypertrophy. Trachea midline, nl thryroid. Chest was hyperinflated by percussion with diminished breath sounds and moderate increased exp time without wheeze. Hoover sign positive at mid inspiration. Regular rate and rhythm without murmur gallop or rub or increase P2 or edema.  Abd: no hsm, nl  excursion. Ext warm without cyanosis or clubbing.    cxr 09/06/10 Cardiomegaly , old granulomatous disease, and COPD - no current  congestive heart failure or active disease.      Assessment & Plan:

## 2011-01-07 NOTE — Patient Instructions (Signed)
You do not have signifcant copd - if you begin to be limited from desired activities by your breathing we need to see you back asap

## 2011-01-07 NOTE — Assessment & Plan Note (Signed)
Did not take inderal yet today > the fact that he can tolerate this med is strongly against airways dz so ok to continue but needs to be more organized as to when he takes his meds and not forget dosing due to rebound concerns

## 2011-01-31 ENCOUNTER — Other Ambulatory Visit: Payer: Self-pay | Admitting: Internal Medicine

## 2011-02-14 ENCOUNTER — Emergency Department (INDEPENDENT_AMBULATORY_CARE_PROVIDER_SITE_OTHER): Payer: Medicare Other

## 2011-02-14 ENCOUNTER — Inpatient Hospital Stay (HOSPITAL_BASED_OUTPATIENT_CLINIC_OR_DEPARTMENT_OTHER)
Admission: EM | Admit: 2011-02-14 | Discharge: 2011-02-20 | DRG: 418 | Disposition: A | Discharge status: Home / Self Care | Payer: Medicare Other | Attending: Internal Medicine | Admitting: Internal Medicine

## 2011-02-14 ENCOUNTER — Encounter (HOSPITAL_BASED_OUTPATIENT_CLINIC_OR_DEPARTMENT_OTHER): Payer: Self-pay

## 2011-02-14 DIAGNOSIS — N281 Cyst of kidney, acquired: Secondary | ICD-10-CM

## 2011-02-14 DIAGNOSIS — K851 Biliary acute pancreatitis without necrosis or infection: Secondary | ICD-10-CM | POA: Diagnosis present

## 2011-02-14 DIAGNOSIS — J984 Other disorders of lung: Secondary | ICD-10-CM | POA: Diagnosis present

## 2011-02-14 DIAGNOSIS — E876 Hypokalemia: Secondary | ICD-10-CM | POA: Diagnosis present

## 2011-02-14 DIAGNOSIS — M19079 Primary osteoarthritis, unspecified ankle and foot: Secondary | ICD-10-CM | POA: Diagnosis present

## 2011-02-14 DIAGNOSIS — R0600 Dyspnea, unspecified: Secondary | ICD-10-CM | POA: Diagnosis present

## 2011-02-14 DIAGNOSIS — E538 Deficiency of other specified B group vitamins: Secondary | ICD-10-CM | POA: Diagnosis present

## 2011-02-14 DIAGNOSIS — R079 Chest pain, unspecified: Secondary | ICD-10-CM

## 2011-02-14 DIAGNOSIS — K859 Acute pancreatitis without necrosis or infection, unspecified: Secondary | ICD-10-CM | POA: Diagnosis present

## 2011-02-14 DIAGNOSIS — Z79899 Other long term (current) drug therapy: Secondary | ICD-10-CM

## 2011-02-14 DIAGNOSIS — E781 Pure hyperglyceridemia: Secondary | ICD-10-CM | POA: Diagnosis present

## 2011-02-14 DIAGNOSIS — K8010 Calculus of gallbladder with chronic cholecystitis without obstruction: Secondary | ICD-10-CM | POA: Diagnosis present

## 2011-02-14 DIAGNOSIS — M949 Disorder of cartilage, unspecified: Secondary | ICD-10-CM | POA: Diagnosis present

## 2011-02-14 DIAGNOSIS — J99 Respiratory disorders in diseases classified elsewhere: Secondary | ICD-10-CM

## 2011-02-14 DIAGNOSIS — K819 Cholecystitis, unspecified: Secondary | ICD-10-CM

## 2011-02-14 DIAGNOSIS — R509 Fever, unspecified: Secondary | ICD-10-CM

## 2011-02-14 DIAGNOSIS — R1011 Right upper quadrant pain: Secondary | ICD-10-CM

## 2011-02-14 DIAGNOSIS — Z87891 Personal history of nicotine dependence: Secondary | ICD-10-CM

## 2011-02-14 DIAGNOSIS — J4489 Other specified chronic obstructive pulmonary disease: Secondary | ICD-10-CM | POA: Diagnosis present

## 2011-02-14 DIAGNOSIS — M62838 Other muscle spasm: Secondary | ICD-10-CM | POA: Diagnosis present

## 2011-02-14 DIAGNOSIS — M899 Disorder of bone, unspecified: Secondary | ICD-10-CM | POA: Diagnosis present

## 2011-02-14 DIAGNOSIS — I4891 Unspecified atrial fibrillation: Secondary | ICD-10-CM | POA: Diagnosis present

## 2011-02-14 DIAGNOSIS — K2970 Gastritis, unspecified, without bleeding: Secondary | ICD-10-CM | POA: Insufficient documentation

## 2011-02-14 DIAGNOSIS — J449 Chronic obstructive pulmonary disease, unspecified: Secondary | ICD-10-CM | POA: Diagnosis present

## 2011-02-14 DIAGNOSIS — K449 Diaphragmatic hernia without obstruction or gangrene: Secondary | ICD-10-CM | POA: Diagnosis present

## 2011-02-14 DIAGNOSIS — Z85828 Personal history of other malignant neoplasm of skin: Secondary | ICD-10-CM

## 2011-02-14 DIAGNOSIS — R911 Solitary pulmonary nodule: Secondary | ICD-10-CM

## 2011-02-14 DIAGNOSIS — N400 Enlarged prostate without lower urinary tract symptoms: Secondary | ICD-10-CM | POA: Diagnosis present

## 2011-02-14 DIAGNOSIS — K5080 Crohn's disease of both small and large intestine without complications: Secondary | ICD-10-CM | POA: Diagnosis present

## 2011-02-14 DIAGNOSIS — I517 Cardiomegaly: Secondary | ICD-10-CM

## 2011-02-14 DIAGNOSIS — I10 Essential (primary) hypertension: Secondary | ICD-10-CM | POA: Insufficient documentation

## 2011-02-14 HISTORY — DX: Unspecified atrial fibrillation: I48.91

## 2011-02-14 HISTORY — DX: Anxiety disorder, unspecified: F41.9

## 2011-02-14 HISTORY — DX: Diaphragmatic hernia without obstruction or gangrene: K44.9

## 2011-02-14 HISTORY — DX: Chronic obstructive pulmonary disease, unspecified: J44.9

## 2011-02-14 LAB — URINALYSIS, ROUTINE W REFLEX MICROSCOPIC
Bilirubin Urine: NEGATIVE
Glucose, UA: NEGATIVE mg/dL
Ketones, ur: NEGATIVE mg/dL
Leukocytes, UA: NEGATIVE
Nitrite: NEGATIVE
Protein, ur: NEGATIVE mg/dL
Specific Gravity, Urine: 1.02 (ref 1.005–1.030)
Urobilinogen, UA: 0.2 mg/dL (ref 0.0–1.0)
pH: 5.5 (ref 5.0–8.0)

## 2011-02-14 LAB — CBC
HCT: 40.4 % (ref 39.0–52.0)
Hemoglobin: 14 g/dL (ref 13.0–17.0)
MCH: 31.1 pg (ref 26.0–34.0)
MCHC: 34.7 g/dL (ref 30.0–36.0)
MCV: 89.8 fL (ref 78.0–100.0)
Platelets: 155 10*3/uL (ref 150–400)
RBC: 4.5 MIL/uL (ref 4.22–5.81)
RDW: 12.5 % (ref 11.5–15.5)
WBC: 12.9 10*3/uL — ABNORMAL HIGH (ref 4.0–10.5)

## 2011-02-14 LAB — COMPREHENSIVE METABOLIC PANEL
ALT: 22 U/L (ref 0–53)
AST: 41 U/L — ABNORMAL HIGH (ref 0–37)
Albumin: 3.7 g/dL (ref 3.5–5.2)
Alkaline Phosphatase: 117 U/L (ref 39–117)
BUN: 15 mg/dL (ref 6–23)
CO2: 23 mEq/L (ref 19–32)
Calcium: 9.3 mg/dL (ref 8.4–10.5)
Chloride: 99 mEq/L (ref 96–112)
Creatinine, Ser: 0.8 mg/dL (ref 0.50–1.35)
GFR calc Af Amer: >90 mL/min (ref >90)
GFR calc non Af Amer: 82 mL/min — ABNORMAL LOW (ref >90)
Glucose, Bld: 106 mg/dL — ABNORMAL HIGH (ref 70–99)
Potassium: 3.3 mEq/L — ABNORMAL LOW (ref 3.5–5.1)
Sodium: 136 mEq/L (ref 135–145)
Total Bilirubin: 0.8 mg/dL (ref 0.3–1.2)
Total Protein: 8 g/dL (ref 6.0–8.3)

## 2011-02-14 LAB — DIFFERENTIAL
Basophils Absolute: 0 10*3/uL (ref 0.0–0.1)
Basophils Relative: 0 % (ref 0–1)
Eosinophils Absolute: 0 10*3/uL (ref 0.0–0.7)
Eosinophils Relative: 0 % (ref 0–5)
Lymphocytes Relative: 6 % — ABNORMAL LOW (ref 12–46)
Lymphs Abs: 0.8 10*3/uL (ref 0.7–4.0)
Monocytes Absolute: 1 10*3/uL (ref 0.1–1.0)
Monocytes Relative: 8 % (ref 3–12)
Neutro Abs: 11.1 10*3/uL — ABNORMAL HIGH (ref 1.7–7.7)
Neutrophils Relative %: 86 % — ABNORMAL HIGH (ref 43–77)

## 2011-02-14 LAB — URINE MICROSCOPIC-ADD ON

## 2011-02-14 LAB — LIPASE, BLOOD: Lipase: 2519 U/L — ABNORMAL HIGH (ref 11–59)

## 2011-02-14 MED ORDER — IOHEXOL 300 MG/ML  SOLN
100.0000 mL | Freq: Once | INTRAMUSCULAR | Status: AC | PRN
  Administered 2011-02-14: 100 mL via INTRAVENOUS

## 2011-02-14 MED ORDER — SODIUM CHLORIDE 0.9 % IV BOLUS (SEPSIS)
1000.0000 mL | Freq: Once | INTRAVENOUS | Status: AC
  Administered 2011-02-14: 1000 mL via INTRAVENOUS

## 2011-02-14 MED ORDER — MORPHINE SULFATE 4 MG/ML IJ SOLN
4.0000 mg | Freq: Once | INTRAMUSCULAR | Status: AC
  Administered 2011-02-15: 4 mg via INTRAVENOUS
  Filled 2011-02-14: qty 1

## 2011-02-14 NOTE — ED Provider Notes (Signed)
Medical screening examination/treatment/procedure(s) were conducted as a shared visit with non-physician practitioner(s) and myself.  I personally evaluated the patient during the encounter  Epigastric/RUQ pain. +epigastric and RUQ ttp. Gallstone pancreatitis +/- cholecystitis. DW Dr. Michaell Cowing, general surgery. Transfer patient to [facility] ER so that he can evaluate. Declined primary admission without first evaluating the patient. D/W [facility] Charge nurse Fleet Contras  And [facility] EDP Dr. Effie Shy  D/W Radiology-- CT c/w cholecystitis NOT appendicitis (pt s/p appendectomy)  Forbes Cellar, MD 02/14/11 2355

## 2011-02-14 NOTE — ED Notes (Signed)
Chills started last night vomited x 2 en route here-denies pain

## 2011-02-14 NOTE — ED Provider Notes (Signed)
History     CSN: [id number] Arrival date & time: 02/14/2011  7:26 PM   First MD Initiated Contact with Patient 02/14/11 1937      Chief Complaint  Patient presents with  . Chills  . Emesis    (Consider location/radiation/quality/duration/timing/severity/associated sxs/prior treatment) The history is provided by the patient.   patient is an 77 year old gentleman, who states last night he started having chills and he vomited x2 today.  He denies any chest pain, shortness of breath, diarrhea, weakness headache, fever, myalgias, or back pain.  Patient does state that he is having some pain in his upper abdomen.  Patient denies taking any medications to alleviate the symptoms.   Past Medical History  Diagnosis Date  . Muscle tremor     saw neurology remotely elsewhere, was Rx inderal  . Crohn's ileocolitis   . Osteopenia     per DEXA 11/2008  . Hypertension 11/11    mild  . Hemorrhoids   . Hypertrophy of prostate     w/o UR obst & oth luts  . Peripheral neuropathy     h/o neg w/u  . Osteoarthritis     ankle,right  . ED (erectile dysfunction)   . Tremor   . Hypertriglyceridemia   . B12 deficiency   . Carcinoma     skin, squamous cell  . Gallstones     Past Surgical History  Procedure Date  . Resection of distal ileum and cecum w/ appendectomy     18-inch small intestine, 1984 for Crohn's disease    Family History  Problem Relation Age of Onset  . Crohn's disease Brother   . Breast cancer Mother   . Colon cancer Neg Hx   . Heart attack Father 95  . Dementia Sister   . Diabetes Sister   . Prostate cancer Neg Hx     History  Substance Use Topics  . Smoking status: Former Smoker -- 1.0 packs/day for 40 years    Types: Cigarettes, Pipe    Quit date: 03/24/1990  . Smokeless tobacco: Never Used  . Alcohol Use: 0.0 oz/week     1 beer every 2-3 weeks       Review of Systems  Constitutional: Positive for chills. Negative for fever, diaphoresis and fatigue.    HENT: Negative for congestion, rhinorrhea, neck pain and neck stiffness.   Eyes: Negative for visual disturbance.  Respiratory: Negative for cough, chest tightness and shortness of breath.   Cardiovascular: Negative for chest pain and palpitations.  Gastrointestinal: Positive for nausea, vomiting and abdominal pain. Negative for diarrhea, blood in stool and abdominal distention.  Genitourinary: Negative for dysuria.  Musculoskeletal: Negative for myalgias and joint swelling.  Neurological: Negative for dizziness, weakness, light-headedness and headaches.   All pertinent positives and negatives were reviewed in the history of present illness. Allergies  Review of patient's allergies indicates no known allergies.  Home Medications   Current Outpatient Rx  Name Route Sig Dispense Refill  . AMLODIPINE BESYLATE 2.5 MG PO TABS  TAKE 1 TABLET ONCE DAILY 90 tablet 1  . BUSPIRONE HCL 15 MG PO TABS  TAKE 1 TABLET TWICE A DAY 180 tablet 1  . CALTRATE 600+D PO Oral Take 1 tablet by mouth daily.     Marland Kitchen CLOBETASOL PROPIONATE 0.05 % EX CREA  APPLY TO KNEES TWICE A DAY AS NEEDED 30 g 0  . CYANOCOBALAMIN 500 MCG/0.1ML NA SOLN       . FLAX SEED OIL 1000 MG PO CAPS  Oral Take 1 capsule by mouth 2 (two) times daily.     Marland Kitchen GEMFIBROZIL 600 MG PO TABS  TAKE 1 TABLET TWICE A DAY 180 tablet 0  . HYDROCORTISONE ACETATE 25 MG RE SUPP Rectal Place 25 mg rectally 2 (two) times daily as needed. As directed if needed    . LIDOCAINE-HYDROCORTISONE ACE 3-0.5 % EX CREA Apply externally Apply topically. As needed    . PENTASA 250 MG PO CPCR  TAKE 2 CAPSULES TWICE A DAY 360 capsule 2  . PROPRANOLOL HCL 80 MG PO TABS  TAKE 1 TABLET TWICE A DAY 180 tablet 2  . SILDENAFIL CITRATE 25 MG PO TABS Oral Take 25 mg by mouth daily as needed.        BP 141/64  Pulse 88  Temp(Src) 98.5 F (36.9 C) (Oral)  Resp 20  Ht 6' (1.829 m)  Wt 178 lb (80.74 kg)  BMI 24.14 kg/m2  SpO2 92%  Physical Exam  Constitutional: He is  oriented to person, place, and time. He appears well-developed and well-nourished. No distress.  HENT:  Head: Normocephalic and atraumatic.  Eyes: Conjunctivae are normal. No scleral icterus.  Neck: Normal range of motion. Neck supple.  Cardiovascular: Normal rate, regular rhythm and normal heart sounds.  Exam reveals no gallop and no friction rub.   No murmur heard. Pulmonary/Chest: Effort normal and breath sounds normal. No respiratory distress. He has no wheezes. He has no rales.  Abdominal: Soft. Normal appearance and bowel sounds are normal. There is tenderness in the right upper quadrant and epigastric area. There is no rebound and no guarding. No hernia.    Neurological: He is alert and oriented to person, place, and time.  Skin: Skin is warm and dry. No rash noted.  Psychiatric: He has a normal mood and affect. His behavior is normal. Judgment and thought content normal.    ED Course  Procedures (including critical care time)   Patient has been stable here in the emergency department and is alert and oriented x3.  He does not request any pain medication or nausea medicine at this time.  He appears to be resting comfortably in his bed at this time.       MDM          Carlyle Dolly, PA 02/14/11 2210

## 2011-02-15 ENCOUNTER — Other Ambulatory Visit (INDEPENDENT_AMBULATORY_CARE_PROVIDER_SITE_OTHER): Payer: Self-pay | Admitting: Surgery

## 2011-02-15 ENCOUNTER — Encounter (HOSPITAL_BASED_OUTPATIENT_CLINIC_OR_DEPARTMENT_OTHER): Payer: Self-pay | Admitting: Emergency Medicine

## 2011-02-15 DIAGNOSIS — K851 Biliary acute pancreatitis without necrosis or infection: Secondary | ICD-10-CM | POA: Diagnosis present

## 2011-02-15 DIAGNOSIS — R109 Unspecified abdominal pain: Secondary | ICD-10-CM

## 2011-02-15 DIAGNOSIS — K859 Acute pancreatitis without necrosis or infection, unspecified: Secondary | ICD-10-CM | POA: Diagnosis present

## 2011-02-15 DIAGNOSIS — K8020 Calculus of gallbladder without cholecystitis without obstruction: Secondary | ICD-10-CM

## 2011-02-15 LAB — LIPID PANEL
LDL Cholesterol: 76 mg/dL (ref 0–99)
VLDL: 20 mg/dL (ref 0–40)

## 2011-02-15 MED ORDER — AMPICILLIN-SULBACTAM SODIUM 3 (2-1) G IJ SOLR
3.0000 g | Freq: Four times a day (QID) | INTRAMUSCULAR | Status: DC
  Administered 2011-02-15: 3 g via INTRAVENOUS
  Filled 2011-02-15 (×6): qty 3

## 2011-02-15 MED ORDER — ONDANSETRON HCL 4 MG/2ML IJ SOLN: 4.0000 mg | Freq: Four times a day (QID) | INTRAMUSCULAR | Status: DC | PRN

## 2011-02-15 MED ORDER — DIPHENHYDRAMINE HCL 50 MG/ML IJ SOLN: 12.5000 mg | Freq: Four times a day (QID) | INTRAMUSCULAR | Status: DC | PRN

## 2011-02-15 MED ORDER — PROMETHAZINE HCL 25 MG/ML IJ SOLN
12.5000 mg | Freq: Four times a day (QID) | INTRAMUSCULAR | Status: DC | PRN
  Filled 2011-02-15: qty 1

## 2011-02-15 MED ORDER — HYDROMORPHONE HCL PF 1 MG/ML IJ SOLN: 0.5000 mg | INTRAMUSCULAR | Status: DC | PRN

## 2011-02-15 MED ORDER — MORPHINE SULFATE 2 MG/ML IJ SOLN
2.0000 mg | INTRAMUSCULAR | Status: DC | PRN
  Administered 2011-02-17 – 2011-02-18 (×2): 2 mg via INTRAVENOUS
  Filled 2011-02-15 (×2): qty 1

## 2011-02-15 MED ORDER — HYDROCORTISONE ACE-PRAMOXINE 2.5-1 % RE CREA
1.0000 | TOPICAL_CREAM | Freq: Four times a day (QID) | RECTAL | Status: DC | PRN
Start: 2011-02-15 — End: 2011-02-20
  Filled 2011-02-15: qty 30

## 2011-02-15 MED ORDER — PROPRANOLOL HCL 40 MG PO TABS
80.0000 mg | ORAL_TABLET | Freq: Two times a day (BID) | ORAL | Status: DC
  Filled 2011-02-15 (×2): qty 1

## 2011-02-15 MED ORDER — ENOXAPARIN SODIUM 40 MG/0.4ML ~~LOC~~ SOLN
40.0000 mg | SUBCUTANEOUS | Status: DC
  Administered 2011-02-15 – 2011-02-18 (×3): 40 mg via SUBCUTANEOUS
  Filled 2011-02-15 (×5): qty 0.4

## 2011-02-15 MED ORDER — MAGIC MOUTHWASH
15.0000 mL | Freq: Four times a day (QID) | ORAL | Status: DC | PRN
  Filled 2011-02-15: qty 15

## 2011-02-15 MED ORDER — WITCH HAZEL-GLYCERIN EX PADS: 1.0000 "application " | MEDICATED_PAD | CUTANEOUS | Status: DC | PRN

## 2011-02-15 MED ORDER — GUAIFENESIN-DM 100-10 MG/5ML PO SYRP
15.0000 mL | ORAL_SOLUTION | ORAL | Status: DC | PRN
  Filled 2011-02-15: qty 15

## 2011-02-15 MED ORDER — KCL IN DEXTROSE-NACL 20-5-0.45 MEQ/L-%-% IV SOLN
INTRAVENOUS | Status: DC
  Administered 2011-02-15 – 2011-02-17 (×4): via INTRAVENOUS
  Filled 2011-02-15 (×7): qty 1000

## 2011-02-15 MED ORDER — ONDANSETRON HCL 4 MG PO TABS: 4.0000 mg | ORAL_TABLET | Freq: Four times a day (QID) | ORAL | Status: DC | PRN

## 2011-02-15 MED ORDER — PSYLLIUM 95 % PO PACK
1.0000 | PACK | Freq: Two times a day (BID) | ORAL | Status: DC
  Administered 2011-02-15 – 2011-02-20 (×8): 1 via ORAL
  Filled 2011-02-15 (×14): qty 1

## 2011-02-15 MED ORDER — BUSPIRONE HCL 15 MG PO TABS
15.0000 mg | ORAL_TABLET | Freq: Two times a day (BID) | ORAL | Status: DC
  Administered 2011-02-15 – 2011-02-20 (×10): 15 mg via ORAL
  Filled 2011-02-15 (×13): qty 1

## 2011-02-15 MED ORDER — ZOLPIDEM TARTRATE 5 MG PO TABS: 5.0000 mg | ORAL_TABLET | Freq: Every evening | ORAL | Status: DC | PRN

## 2011-02-15 MED ORDER — ALUM & MAG HYDROXIDE-SIMETH 200-200-20 MG/5ML PO SUSP: 30.0000 mL | Freq: Four times a day (QID) | ORAL | Status: DC | PRN

## 2011-02-15 MED ORDER — FLORA-Q PO CAPS
1.0000 | ORAL_CAPSULE | Freq: Every day | ORAL | Status: DC
  Administered 2011-02-15 – 2011-02-20 (×5): 1 via ORAL
  Filled 2011-02-15 (×6): qty 1

## 2011-02-15 MED ORDER — ACETAMINOPHEN 325 MG PO TABS
325.0000 mg | ORAL_TABLET | Freq: Four times a day (QID) | ORAL | Status: DC | PRN
  Administered 2011-02-18: 650 mg via ORAL
  Filled 2011-02-15 (×2): qty 2

## 2011-02-15 MED ORDER — ONDANSETRON 4 MG PO TBDP
4.0000 mg | ORAL_TABLET | Freq: Four times a day (QID) | ORAL | Status: DC | PRN
  Filled 2011-02-15: qty 2

## 2011-02-15 MED ORDER — BISACODYL 10 MG RE SUPP: 10.0000 mg | Freq: Two times a day (BID) | RECTAL | Status: DC | PRN

## 2011-02-15 MED ORDER — DIPHENHYDRAMINE HCL 25 MG PO CAPS: 25.0000 mg | ORAL_CAPSULE | Freq: Four times a day (QID) | ORAL | Status: DC | PRN

## 2011-02-15 MED ORDER — LIP MEDEX EX OINT
1.0000 "application " | TOPICAL_OINTMENT | Freq: Two times a day (BID) | CUTANEOUS | Status: DC
  Administered 2011-02-15 – 2011-02-20 (×10): 1 via TOPICAL
  Filled 2011-02-15 (×2): qty 7

## 2011-02-15 MED ORDER — GEMFIBROZIL 600 MG PO TABS
600.0000 mg | ORAL_TABLET | Freq: Two times a day (BID) | ORAL | Status: DC
  Administered 2011-02-15 – 2011-02-20 (×9): 600 mg via ORAL
  Filled 2011-02-15 (×13): qty 1

## 2011-02-15 MED ORDER — MESALAMINE ER 250 MG PO CPCR
500.0000 mg | ORAL_CAPSULE | Freq: Two times a day (BID) | ORAL | Status: DC
  Administered 2011-02-15 – 2011-02-20 (×10): 500 mg via ORAL
  Filled 2011-02-15 (×13): qty 2

## 2011-02-15 MED ORDER — PROPRANOLOL HCL 80 MG PO TABS
80.0000 mg | ORAL_TABLET | Freq: Two times a day (BID) | ORAL | Status: DC
  Administered 2011-02-15 – 2011-02-20 (×9): 80 mg via ORAL
  Filled 2011-02-15 (×13): qty 1

## 2011-02-15 MED ORDER — AMLODIPINE BESYLATE 2.5 MG PO TABS
2.5000 mg | ORAL_TABLET | Freq: Every day | ORAL | Status: DC
  Administered 2011-02-15 – 2011-02-20 (×5): 2.5 mg via ORAL
  Filled 2011-02-15 (×6): qty 1

## 2011-02-15 MED ORDER — DEXTROMETHORPHAN-GUAIFENESIN 10-100 MG/5ML PO LIQD
15.0000 mL | ORAL | Status: DC | PRN
  Filled 2011-02-15: qty 15

## 2011-02-15 MED ORDER — SODIUM CHLORIDE 0.9 % IV SOLN
3.0000 g | Freq: Four times a day (QID) | INTRAVENOUS | Status: DC
  Administered 2011-02-15 – 2011-02-17 (×9): 3 g via INTRAVENOUS
  Filled 2011-02-15 (×12): qty 3

## 2011-02-15 MED ORDER — HYDROMORPHONE BOLUS VIA INFUSION
0.5000 mg | INTRAVENOUS | Status: DC | PRN
  Filled 2011-02-15: qty 200

## 2011-02-15 NOTE — ED Notes (Signed)
Upon pt arrival pt O2 sat 88%. O2 applied at 2L. O2 now 96%.

## 2011-02-15 NOTE — H&P (Signed)
PCP:   Willow Ora, MD, MD   Chief Complaint:  Abdominal pain  HPI: This is an extremely pleasant 77-year-old gentleman who presents with complaint of abdominal pain he stated it started Thursday after he lifted a heavy [place]. He initially was about back he related this to the fact that he lifted the [place]. On Thursday he hardly ate, he also developed some abdominal pain and cramping by the evening. The next day at approximately 2:30 he had green beans as sweet potato, he developed nausea vomiting, no evidence of blood. His abdominal pain he said was bearable, but he knew he should not be there sig into the ear tonight. He describes the pain as sharp, occurs mainly with movement. 5/10 at its worse. He reports no fevers but lots of chills. He states he has some soft stool yesterday but that has since resolved. He does have a history of pancreatitis 9 years ago, at that point he was noted to have gallstones, it was opted not to do surgery. He has not had a recurrence since.  History obtained from patient who is currently pain-free. Patient has been seen by surgery in the ER.  Review of Systems: Positives bolded  The patient denies no anorexia, fever, weight loss, vision loss, decreased hearing, hoarseness, chest pain, syncope, dyspnea on exertion, peripheral edema, balance deficits, hemoptysis, abdominal pain, melena, hematochezia, severe indigestion/heartburn, hematuria, incontinence, genital sores, muscle weakness, suspicious skin lesions, transient blindness, difficulty walking, depression, no unusual weight change, abnormal bleeding, enlarged lymph nodes, angioedema, and breast masses.  Past Medical History: Past Medical History  Diagnosis Date  . Muscle tremor     saw neurology remotely elsewhere, was Rx inderal  . Crohn's ileocolitis   . Osteopenia     per DEXA 11/2008  . Hypertension 11/11    mild  . Hemorrhoids   . Hypertrophy of prostate     w/o UR obst & oth luts  . Peripheral  neuropathy     h/o neg w/u  . Osteoarthritis     ankle,right  . ED (erectile dysfunction)   . Tremor   . Hypertriglyceridemia   . B12 deficiency   . Gallstones    Past Surgical History  Procedure Date  . Resection of distal ileum and cecum w/ appendectomy     18-inch small intestine, 1984 for Crohn's disease    Medications: Prior to Admission medications   Medication Sig Start Date End Date Taking? Authorizing Provider  amLODipine (NORVASC) 2.5 MG tablet TAKE 1 TABLET ONCE DAILY 10/01/10  Yes Wanda Plump, MD  busPIRone (BUSPAR) 15 MG tablet TAKE 1 TABLET TWICE A DAY 11/03/10  Yes Wanda Plump, MD  Calcium Carbonate-Vitamin D (CALTRATE 600+D PO) Take 1 tablet by mouth daily.    Yes Historical Provider, MD  clobetasol (TEMOVATE) 0.05 % cream APPLY TO KNEES TWICE A DAY AS NEEDED 09/30/10  Yes Wanda Plump, MD  Cyanocobalamin (NASCOBAL) 500 MCG/0.1ML SOLN   01/31/11  Yes Wanda Plump, MD  Flaxseed, Linseed, (FLAX SEED OIL) 1000 MG CAPS Take 1 capsule by mouth 2 (two) times daily.    Yes Historical Provider, MD  gemfibrozil (LOPID) 600 MG tablet TAKE 1 TABLET TWICE A DAY 12/30/10  Yes Wanda Plump, MD  hydrocortisone (ANUSOL-HC) 25 MG suppository Place 25 mg rectally 2 (two) times daily as needed. As directed if needed 06/20/10  Yes Wanda Plump, MD  Lidocaine-Hydrocortisone Ace 3-0.5 % CREA Apply topically. As needed   Yes Historical  Provider, MD  PENTASA 250 MG CR capsule TAKE 2 CAPSULES TWICE A DAY 01/06/11  Yes Wanda Plump, MD  propranolol (INDERAL) 80 MG tablet TAKE 1 TABLET TWICE A DAY 11/03/10  Yes Wanda Plump, MD  sildenafil (VIAGRA) 25 MG tablet Take 25 mg by mouth daily as needed.      Historical Provider, MD    Allergies:  No Known Allergies  Social History:  reports that he quit smoking about 20 years ago. His smoking use included Cigarettes and Pipe. He has a 40 pack-year smoking history. He has never used smokeless tobacco. He reports that he drinks alcohol. He reports that he does not use  illicit drugs.  Family History: Family History  Problem Relation Age of Onset  . Crohn's disease Brother   . Breast cancer Mother   . Colon cancer Neg Hx   . Heart attack Father 13  . Dementia Sister   . Diabetes Sister   . Prostate cancer Neg Hx     Physical Exam: Filed Vitals:   02/14/11 1844 02/14/11 2352 02/15/11 0108 02/15/11 0116  BP: 141/64 143/68 174/93   Pulse: 88 78 100   Temp: 98.5 F (36.9 C)  98.9 F (37.2 C)   TempSrc: Oral  Oral   Resp: 20 18    Height: 6' (1.829 m)     Weight: 80.74 kg (178 lb)     SpO2: 92% 91% 91% 96%    General:  Alert and oriented times three, well developed and nourished, no acute distress currently Eyes: PERRLA, pink conjunctiva, no scleral icterus ENT: Moist oral mucosa, neck supple, no thyromegaly Lungs: clear to ascultation, no wheeze, no crackles, no use of accessory muscles Cardiovascular: regular rate and rhythm, no regurgitation, no gallops, no murmurs. No carotid bruits, no JVD Abdomen: soft, positive BS, non-tender, non-distended, no organomegaly, not an acute abdomen GU: not examined Neuro: CN II - XII grossly intact, sensation intact Musculoskeletal: strength 5/5 all extremities, no clubbing, cyanosis or edema Skin: no rash, no subcutaneous crepitation, no decubitus Psych: appropriate patient   Labs on Admission:   [facility] 02/14/11 2041  NA 136  K 3.3*  CL 99  CO2 23  GLUCOSE 106*  BUN 15  CREATININE 0.80  CALCIUM 9.3  MG --  PHOS --    Basename 02/14/11 2041  AST 41*  ALT 22  ALKPHOS 117  BILITOT 0.8  PROT 8.0  ALBUMIN 3.7    Basename 02/14/11 2041  LIPASE 2519*  AMYLASE --    Basename 02/14/11 2041  WBC 12.9*  NEUTROABS 11.1*  HGB 14.0  HCT 40.4  MCV 89.8  PLT 155   No results found for this basename: CKTOTAL:3,CKMB:3,CKMBINDEX:3,TROPONINI:3 in the last 72 hours No results found for this basename: TSH,T4TOTAL,FREET3,T3FREE,THYROIDAB in the last 72 hours No results found for this  basename: VITAMINB12:2,FOLATE:2,FERRITIN:2,TIBC:2,IRON:2,RETICCTPCT:2 in the last 72 hours  Radiological Exams on Admission: Dg Chest 2 View  02/14/2011  *RADIOLOGY REPORT*  Clinical Data: Pain, fever, chills, emesis, right upper quadrant pain, leukocytosis, history COPD, hypertension, Crohn's disease  CHEST - 2 VIEW  Comparison: 09/04/2010  Findings: Enlargement of cardiac silhouette. Tortuous aorta. Mild pulmonary vascular congestion. Emphysematous changes with calcified granuloma left upper lobe. No definite pulmonary infiltrate or pleural effusion. Chronic accentuation of left basilar interstitial markings stable. No pneumothorax. Bones unremarkable.  IMPRESSION: Cardiomegaly with minimal chronic pulmonary vascular congestion. Emphysematous changes with left basilar fibrosis. No acute abnormalities.  Original Report Authenticated By: Lollie Marrow, M.D.  Ct Abdomen Pelvis W Contrast  02/14/2011  **ADDENDUM** CREATED: 02/14/2011 23:33:36  The patient is status post appendectomy.  The first point in the impression should read:  1.  Suspect mild acute cholecystitis, with mild apparent gallbladder wall thickening and inflammation; underlying cholelithiasis again noted.  No evidence to suggest obstruction.  **END ADDENDUM** SIGNED BY: Tonia Ghent, M.D.   02/14/2011  *RADIOLOGY REPORT*  Clinical Data: Right upper quadrant abdominal pain, chills and emesis.  Leukocytosis.  CT ABDOMEN AND PELVIS WITH CONTRAST  Technique:  Multidetector CT imaging of the abdomen and pelvis was performed following the standard protocol during bolus administration of intravenous contrast.  Contrast: OMNIPAQUE IOHEXOL 300 MG/ML IV SOLN  Comparison: CT of the abdomen and pelvis performed 03/01/2009, and abdominal radiograph performed 03/05/2009  Findings: Multiple small confluent nodules are noted within the posterior right middle lobe, along the right major fissure, measuring 1.7 cm in size.  Though this could be  infectious in nature, malignancy cannot be excluded.  Mild chronic fibrotic change is seen at both lung bases.  There is mild diffuse fatty infiltration within the liver, with mild sparing about the gallbladder fossa.  The spleen is unremarkable in appearance.  There is mild apparent gallbladder wall thickening and mild associated inflammation; stones are again noted layering dependently within the gallbladder.  This could reflect mild cholecystitis.  There is no evidence of distension of the common hepatic duct or intrahepatic biliary ducts to suggest obstruction.  The pancreas and adrenal glands are unremarkable in appearance.  Nonspecific perinephric stranding is noted bilaterally, similar in appearance to the prior study.  Bilateral renal cysts are seen, measuring up to 3.0 cm in size.  There is no evidence of hydronephrosis.  No renal or ureteral stones are identified.  No free fluid is identified.  The small bowel is unremarkable in appearance.  The stomach is within normal limits.  No acute vascular abnormalities are seen.  Scattered calcification is noted along the distal abdominal aorta and its branches.  The patient is status post appendectomy, with scattered postoperative change noted at the right lower quadrant.  The colon is unremarkable in appearance.  The bladder is moderately distended and unremarkable in appearance. There is borderline prominence of the prostate, measuring 4.8 cm in transverse dimension.  No inguinal lymphadenopathy is seen.  No acute osseous abnormalities are identified.  IMPRESSION:  1.  Suspect mild acute appendicitis, with mild apparent gallbladder wall thickening and inflammation; underlying cholelithiasis again noted.  No evidence to suggest obstruction. 2.  Bilateral renal cysts, measuring up to 3.0 cm in size. 3.  Borderline prominence of the prostate. 4.  Mild diffuse fatty infiltration within the liver. 5.  Mild chronic fibrotic change noted at both lung bases. 6.   Multiple small confluent nodules noted within the posterior right middle lung lobe, along the right major fissure, measuring 1.7 cm.  Though this could be infectious in nature, malignancy cannot be excluded.  Suggest correlation for signs of lung infection; PET/CT would be helpful for further evaluation if no definite pneumonia is present, when and as deemed clinically appropriate.  Original Report Authenticated By: Tonia Ghent, M.D.    Assessment/Plan Present on Admission:  Pancreatitis History of cholelithiasis Patient will be admitted and made n.p.o. IV fluid hydration, pain medication as needed  Surgical consult, see their notes Lipid panel ordered [Patient appendix has been removed]  Lung nodules  Repeat chest x-ray versus PET versus biopsy, defer to a.m. team Crohn's disease an  Hypertension  .B12 DEFICIENCY .HYPERTRIGLYCERIDEMIA Enlarged prostate All stable Home medications continued   Full code  DVT prophylaxis Team 2/Dr. Vonzell Schlatter, Ethyle Tiedt 02/15/2011, 5:35 AM

## 2011-02-15 NOTE — Consult Note (Signed)
Patient Care Team: Wanda Plump, MD as PCP - General Wanda Plump, MD as Referring Physician (Internal Medicine) Sandrea Hughs, MD as Consulting Physician (Pulmonary Disease) Eliezer Bottom., MD,FACG as Consulting Physician (Gastroenterology)  This patient is a 77 y.o.male who presents today for surgical evaluation.   77 y/o with a known history of gallstones from prior attack about 12 years ago. It sounds like he declined surgery at that time. He has known Crohn's ileitis followed by [facility] gastroenterology, particularly Dr. Russella Dar. Question of gastritis in the past. Occasional hemorrhoid issues in the past. He recalls having colonoscopies in the past 2 years without any major problems.  Patient now notes he an episode of upper abdominal pain. He was associated with eating. His oral tolerance has decreased. No diarrhea. No sick contacts nor any travel history. The pain is radiated to his back. He does not seem associated with activity. He does not feel like it is heartburn or reflux.   He was diagnosed with COPD. Not oxygen dependent. That seems to be relatively mild according to Dr. Thurston Hole note with pulmonary. Claims he can walk at least a few blocks. No major cardiac or heart attacks or strokes.  Because his pain did not go away his wife convinced him to come to emergency room. He went to med center [facility]. Workup was concerning for cholecystitis. They transfer the patient was a long for possible surgical evaluation. Patient still has persistent pain. Some nausea. No emesis. He is hesitant to try and eat.  Past Medical History  Diagnosis Date  . Muscle tremor     saw neurology remotely elsewhere, was Rx inderal  . Crohn's ileocolitis   . Osteopenia     per DEXA 11/2008  . Hypertension 11/11    mild  . Hemorrhoids   . Hypertrophy of prostate     w/o UR obst & oth luts  . Peripheral neuropathy     h/o neg w/u  . Osteoarthritis     ankle,right  . ED (erectile dysfunction)   .  Tremor   . Hypertriglyceridemia   . B12 deficiency   . Gallstones     Past Surgical History  Procedure Date  . Resection of distal ileum and cecum w/ appendectomy     18-inch small intestine, 1984 for Crohn's disease    History   Social History  . Marital Status: Married    Spouse Name: N/A    Number of Children: 4  . Years of Education: N/A   Occupational History  . Retired    Social History Main Topics  . Smoking status: Former Smoker -- 1.0 packs/day for 40 years    Types: Cigarettes, Pipe    Quit date: 03/24/1990  . Smokeless tobacco: Never Used  . Alcohol Use: 0.0 oz/week     1 beer every 2-3 weeks   . Drug Use: No  . Sexually Active: Not on file   Other Topics Concern  . Not on file   Social History Narrative   Married, lives w/ wife     Family History  Problem Relation Age of Onset  . Crohn's disease Brother   . Breast cancer Mother   . Colon cancer Neg Hx   . Heart attack Father 79  . Dementia Sister   . Diabetes Sister   . Prostate cancer Neg Hx     Current facility-administered medications:acetaminophen (TYLENOL) tablet 325-650 mg, 325-650 mg, Oral, Q6H PRN, Ardeth Sportsman, MD;  alum &  mag hydroxide-simeth (MAALOX PLUS) 400-400-40 MG/5ML suspension 30 mL, 30 mL, Oral, Q6H PRN, Ardeth Sportsman, MD;  Ampicillin-Sulbactam (UNASYN) 3 g in sodium chloride 0.9 % 100 mL IVPB, 3 g, Intravenous, Q6H, Ardeth Sportsman, MD bisacodyl (DULCOLAX) suppository 10 mg, 10 mg, Rectal, Q12H PRN, Ardeth Sportsman, MD;  diphenhydrAMINE (BENADRYL) capsule 25 mg, 25 mg, Oral, Q6H PRN, Ardeth Sportsman, MD;  diphenhydrAMINE (BENADRYL) injection 12.5-25 mg, 12.5-25 mg, Intravenous, Q6H PRN, Ardeth Sportsman, MD;  Flora-Q (FLORA-Q) Capsule 1 capsule, 1 capsule, Oral, Daily, Ardeth Sportsman, MD guaiFENesin-dextromethorphan (ROBITUSSIN DM) 100-10 MG/5ML syrup 15 mL, 15 mL, Oral, Q4H PRN, Ardeth Sportsman, MD;  hydrocortisone-pramoxine [facility]) 2.5-1 % rectal cream 1 application, 1  application, Rectal, QID PRN, Ardeth Sportsman, MD;  HYDROmorphone (DILAUDID) bolus via infusion 0.5-2 mg, 0.5-2 mg, Intravenous, Q30 min PRN, Ardeth Sportsman, MD iohexol (OMNIPAQUE) 300 MG/ML injection 100 mL, 100 mL, Intravenous, Once PRN, Medication Radiologist, 100 mL at 02/14/11 2251;  lip balm (CARMEX) ointment 1 application, 1 application, Topical, BID, Ardeth Sportsman, MD;  magic mouthwash, 15 mL, Oral, QID PRN, Ardeth Sportsman, MD;  morphine 4 MG/ML injection 4 mg, 4 mg, Intravenous, Once, Forbes Cellar, MD, 4 mg at 02/15/11 0011 ondansetron (ZOFRAN) injection 4-8 mg, 4-8 mg, Intravenous, Q6H PRN, Ardeth Sportsman, MD;  ondansetron (ZOFRAN) tablet 4 mg, 4 mg, Oral, Q6H PRN, Ardeth Sportsman, MD;  ondansetron (ZOFRAN-ODT) disintegrating tablet 4-8 mg, 4-8 mg, Oral, Q6H PRN, Ardeth Sportsman, MD;  promethazine (PHENERGAN) injection 12.5-25 mg, 12.5-25 mg, Intravenous, Q6H PRN, Ardeth Sportsman, MD psyllium (HYDROCIL/METAMUCIL) packet 1 packet, 1 packet, Oral, BID, Ardeth Sportsman, MD;  sodium chloride 0.9 % bolus 1,000 mL, 1,000 mL, Intravenous, Once, Jamesetta Orleans Custer, PA, 1,000 mL at 02/14/11 2035;  witch hazel-glycerin (TUCKS) pad 1 application, 1 application, Topical, PRN, Ardeth Sportsman, MD;  zolpidem (AMBIEN) tablet 5-10 mg, 5-10 mg, Oral, QHS PRN, Ardeth Sportsman, MD Current outpatient prescriptions:amLODipine (NORVASC) 2.5 MG tablet, TAKE 1 TABLET ONCE DAILY, Disp: 90 tablet, Rfl: 1;  busPIRone (BUSPAR) 15 MG tablet, TAKE 1 TABLET TWICE A DAY, Disp: 180 tablet, Rfl: 1;  Calcium Carbonate-Vitamin D (CALTRATE 600+D PO), Take 1 tablet by mouth daily. , Disp: , Rfl: ;  clobetasol (TEMOVATE) 0.05 % cream, APPLY TO KNEES TWICE A DAY AS NEEDED, Disp: 30 g, Rfl: 0 Cyanocobalamin (NASCOBAL) 500 MCG/0.1ML SOLN,  , Disp: , Rfl: ;  Flaxseed, Linseed, (FLAX SEED OIL) 1000 MG CAPS, Take 1 capsule by mouth 2 (two) times daily. , Disp: , Rfl: ;  gemfibrozil (LOPID) 600 MG tablet, TAKE 1 TABLET TWICE A DAY, Disp:  180 tablet, Rfl: 0;  hydrocortisone (ANUSOL-HC) 25 MG suppository, Place 25 mg rectally 2 (two) times daily as needed. As directed if needed, Disp: , Rfl:  Lidocaine-Hydrocortisone Ace 3-0.5 % CREA, Apply topically. As needed, Disp: , Rfl: ;  PENTASA 250 MG CR capsule, TAKE 2 CAPSULES TWICE A DAY, Disp: 360 capsule, Rfl: 2;  propranolol (INDERAL) 80 MG tablet, TAKE 1 TABLET TWICE A DAY, Disp: 180 tablet, Rfl: 2;  sildenafil (VIAGRA) 25 MG tablet, Take 25 mg by mouth daily as needed.  , Disp: , Rfl:   No Known Allergies  Review of systems  Gen. no fevers chills or sweats  Eyes no discharge or blurry vision  HEENT: Dry mouth. No discharge. No drooling. No recent colds.  Lungs some shortness of breath, no productive sputum  CV: Activity as  noted above. No orthopnea.  GI as above. No active rectal bleeding.  GU no urinary tract infections, kidney stones  Neurological: Chronic tremor. Stable. No focal deficits.  Psych: No agitation. No hallucinations.  Skin: No new sores. No new lesions.  Muscle skeletal: Some major joint arthralgias. No fractures or major pain right now.  Hepatic, renal, endocrine, allergic, immunologic, all negative  General: Pt awake/alert/oriented x4 in no major acute distress Eyes: PERRL, normal EOM. Neuro: CN II-XII intact w/o focal sensory/motor deficits. Lymph: No head/neck/groin lymphadenopathy Psych:  No delerium/psychosis/paranoia HEENT: Normocephalic, Mucus membranes moist.  No thrush Neck: Supple, No tracheal deviation Chest: No pain w good excursion CV:  Pulses intact.  Regular rhythm Abdomen: soft, tender epigastirc > RUQ.  Nondistended.  No incarcerated hernias. RLQ paramedian incision Ext:  SCDs BLE.  No mjr edema.  No cyanosis Skin: No petechiae / purpurae  BP 174/93  Pulse 100  Temp(Src) 98.9 F (37.2 C) (Oral)  Resp 18  Ht 6' (1.829 m)  Wt 178 lb (80.74 kg)  BMI 24.14 kg/m2  SpO2 96%  CBC    Component Value Date/Time   WBC 12.9*  02/14/2011 2041   RBC 4.50 02/14/2011 2041   HGB 14.0 02/14/2011 2041   HCT 40.4 02/14/2011 2041   PLT 155 02/14/2011 2041   MCV 89.8 02/14/2011 2041   MCH 31.1 02/14/2011 2041   MCHC 34.7 02/14/2011 2041   RDW 12.5 02/14/2011 2041   LYMPHSABS 0.8 02/14/2011 2041   MONOABS 1.0 02/14/2011 2041   EOSABS 0.0 02/14/2011 2041   BASOSABS 0.0 02/14/2011 2041    BMET    Component Value Date/Time   NA 136 02/14/2011 2041   K 3.3* 02/14/2011 2041   CL 99 02/14/2011 2041   CO2 23 02/14/2011 2041   GLUCOSE 106* 02/14/2011 2041   BUN 15 02/14/2011 2041   CREATININE 0.80 02/14/2011 2041   CALCIUM 9.3 02/14/2011 2041   GFRNONAA 82* 02/14/2011 2041   GFRAA >90 02/14/2011 2041    Lipase     Component Value Date/Time   LIPASE 2519* 02/14/2011 2041    CMP     Component Value Date/Time   NA 136 02/14/2011 2041   K 3.3* 02/14/2011 2041   CL 99 02/14/2011 2041   CO2 23 02/14/2011 2041   GLUCOSE 106* 02/14/2011 2041   BUN 15 02/14/2011 2041   CREATININE 0.80 02/14/2011 2041   CALCIUM 9.3 02/14/2011 2041   PROT 8.0 02/14/2011 2041   ALBUMIN 3.7 02/14/2011 2041   AST 41* 02/14/2011 2041   ALT 22 02/14/2011 2041   ALKPHOS 117 02/14/2011 2041   BILITOT 0.8 02/14/2011 2041   GFRNONAA 82* 02/14/2011 2041   GFRAA >90 02/14/2011 2041    Dg Chest 2 View  02/14/2011  *RADIOLOGY REPORT*  Clinical Data: Pain, fever, chills, emesis, right upper quadrant pain, leukocytosis, history COPD, hypertension, Crohn's disease  CHEST - 2 VIEW  Comparison: 09/04/2010  Findings: Enlargement of cardiac silhouette. Tortuous aorta. Mild pulmonary vascular congestion. Emphysematous changes with calcified granuloma left upper lobe. No definite pulmonary infiltrate or pleural effusion. Chronic accentuation of left basilar interstitial markings stable. No pneumothorax. Bones unremarkable.  IMPRESSION: Cardiomegaly with minimal chronic pulmonary vascular congestion. Emphysematous changes with left basilar  fibrosis. No acute abnormalities.  Original Report Authenticated By: Lollie Marrow, M.D.   Ct Abdomen Pelvis W Contrast  02/14/2011  **ADDENDUM** CREATED: 02/14/2011 23:33:36  The patient is status post appendectomy.  The first point in the impression should read:  1.  Suspect mild acute cholecystitis, with mild apparent gallbladder wall thickening and inflammation; underlying cholelithiasis again noted.  No evidence to suggest obstruction.  **END ADDENDUM** SIGNED BY: Tonia Ghent, M.D.   02/14/2011  *RADIOLOGY REPORT*  Clinical Data: Right upper quadrant abdominal pain, chills and emesis.  Leukocytosis.  CT ABDOMEN AND PELVIS WITH CONTRAST  Technique:  Multidetector CT imaging of the abdomen and pelvis was performed following the standard protocol during bolus administration of intravenous contrast.  Contrast: OMNIPAQUE IOHEXOL 300 MG/ML IV SOLN  Comparison: CT of the abdomen and pelvis performed 03/01/2009, and abdominal radiograph performed 03/05/2009  Findings: Multiple small confluent nodules are noted within the posterior right middle lobe, along the right major fissure, measuring 1.7 cm in size.  Though this could be infectious in nature, malignancy cannot be excluded.  Mild chronic fibrotic change is seen at both lung bases.  There is mild diffuse fatty infiltration within the liver, with mild sparing about the gallbladder fossa.  The spleen is unremarkable in appearance.  There is mild apparent gallbladder wall thickening and mild associated inflammation; stones are again noted layering dependently within the gallbladder.  This could reflect mild cholecystitis.  There is no evidence of distension of the common hepatic duct or intrahepatic biliary ducts to suggest obstruction.  The pancreas and adrenal glands are unremarkable in appearance.  Nonspecific perinephric stranding is noted bilaterally, similar in appearance to the prior study.  Bilateral renal cysts are seen, measuring up to 3.0 cm  in size.  There is no evidence of hydronephrosis.  No renal or ureteral stones are identified.  No free fluid is identified.  The small bowel is unremarkable in appearance.  The stomach is within normal limits.  No acute vascular abnormalities are seen.  Scattered calcification is noted along the distal abdominal aorta and its branches.  The patient is status post appendectomy, with scattered postoperative change noted at the right lower quadrant.  The colon is unremarkable in appearance.  The bladder is moderately distended and unremarkable in appearance. There is borderline prominence of the prostate, measuring 4.8 cm in transverse dimension.  No inguinal lymphadenopathy is seen.  No acute osseous abnormalities are identified.  IMPRESSION:  1.  Suspect mild acute appendicitis, with mild apparent gallbladder wall thickening and inflammation; underlying cholelithiasis again noted.  No evidence to suggest obstruction. 2.  Bilateral renal cysts, measuring up to 3.0 cm in size. 3.  Borderline prominence of the prostate. 4.  Mild diffuse fatty infiltration within the liver. 5.  Mild chronic fibrotic change noted at both lung bases. 6.  Multiple small confluent nodules noted within the posterior right middle lung lobe, along the right major fissure, measuring 1.7 cm.  Though this could be infectious in nature, malignancy cannot be excluded.  Suggest correlation for signs of lung infection; PET/CT would be helpful for further evaluation if no definite pneumonia is present, when and as deemed clinically appropriate.  Original Report Authenticated By: Tonia Ghent, M.D.   Assessment/Plan:  Pancreatitis with probable gallbladder stone etiology possible early cholecystitis  Patient will require cholecystectomy at some point. However, he needs to be stabilized first.  IV antibiotics  Nausea controll  For medicine admission for optimization of COPD, hypertension, and other health issues. Medical clearance.  We  will follow as consult. Anticipate cholecystectomy in 1-3 days depending how his clinical status improves.  If his liver function tests or a lipase worsened, consider gastroenterology consult for preoperative ERCP if labs in and clinical  exam improved, start with cholecystectomy first.   The anatomy & physiology of hepatobiliary & pancreatic function was discussed.  The pathophysiology of gallbladder dysfunction was discussed.  Natural history risks without surgery was discussed.   I feel the risks of no intervention will lead to serious problems that outweigh the operative risks; therefore, I recommended cholecystectomy to remove the pathology.  I explained laparoscopic techniques with possible need for an open approach.  Probable cholangiogram to evaluate the bilary tract was explained as well.    Risks such as bleeding, infection, abscess, leak, injury to other organs, need for further treatment, heart attack, death, and other risks were discussed.  I noted a good likelihood this will help address the problem.  Possibility that this will not correct all abdominal symptoms was explained.  Goals of post-operative recovery were discussed as well.  We will work to minimize complications.  An educational handout further explaining the pathology and treatment options was given as well.  Questions were answered.  The patient, wife, and son express understanding & wishes to proceed with surgery.

## 2011-02-15 NOTE — ED Notes (Signed)
Pt states that his stomach has felt strange starting the day before yesterday, but is unable to describe what weird is. Patient states that he did not have much of an appetite yesterday and woke up this morning nauseated- pt has vomited 2x today twice at 6om. Abdomen is tender upon palpation. LBM this AM and normal. Patient states that he has urinated normally.

## 2011-02-15 NOTE — Progress Notes (Signed)
ANTIBIOTIC CONSULT NOTE - INITIAL  Pharmacy Consult for Unasyn Indication: Cholecystitis  No Known Allergies  Patient Measurements: Height: 6' (182.9 cm) Weight: 178 lb (80.74 kg) IBW/kg (Calculated) : 77.6  Adjusted Body Weight:   Vital Signs: Temp: 98.3 F (36.8 C) (11/24 1004) Temp src: Oral (11/24 1004) BP: 152/78 mmHg (11/24 1004) Pulse Rate: 98  (11/24 1004) Intake/Output from previous day:   Intake/Output from this shift:    Labs:  [facility] 02/14/11 2041  WBC 12.9*  HGB 14.0  PLT 155  LABCREA --  CREATININE 0.80   Estimated Creatinine Clearance: 80.8 ml/min (by C-G formula based on Cr of 0.8). No results found for this basename: VANCOTROUGH:2,VANCOPEAK:2,VANCORANDOM:2,GENTTROUGH:2,GENTPEAK:2,GENTRANDOM:2,TOBRATROUGH:2,TOBRAPEAK:2,TOBRARND:2,AMIKACINPEAK:2,AMIKACINTROU:2,AMIKACIN:2, in the last 72 hours   Microbiology: No results found for this or any previous visit (from the past 720 hour(s)).  Medical History: Past Medical History  Diagnosis Date  . Muscle tremor     saw neurology remotely elsewhere, was Rx inderal  . Crohn's ileocolitis   . Osteopenia     per DEXA 11/2008  . Hypertension 11/11    mild  . Hemorrhoids   . Hypertrophy of prostate     w/o UR obst & oth luts  . Peripheral neuropathy     h/o neg w/u  . Osteoarthritis     ankle,right  . ED (erectile dysfunction)   . Tremor   . Hypertriglyceridemia   . B12 deficiency   . Gallstones     Medications:  Scheduled:    . amLODipine  2.5 mg Oral Daily  . ampicillin-sulbactam (UNASYN) IV  3 g Intravenous Q6H  . busPIRone  15 mg Oral BID  . enoxaparin  40 mg Subcutaneous Q24H  . Flora-Q  1 capsule Oral Daily  . gemfibrozil  600 mg Oral BID AC  . lip balm  1 application Topical BID  . mesalamine  500 mg Oral BID  .  morphine injection  4 mg Intravenous Once  . propranolol  80 mg Oral BID  . psyllium  1 packet Oral BID  . sodium chloride  1,000 mL Intravenous Once   Infusions:     . dextrose 5 % and 0.45 % NaCl with KCl 20 mEq/L     Anti-infectives     Start     Dose/Rate Route Frequency Ordered Stop   02/15/11 0330   Ampicillin-Sulbactam (UNASYN) 3 g in sodium chloride 0.9 % 100 mL IVPB        3 g 100 mL/hr over 60 Minutes Intravenous Every 6 hours 02/15/11 0238           Assessment: 77 yo male with likely cholecystitis per CT  Goal of Therapy:  Appropriate antibiotic dosed for disease,renal function  Plan:  Continue Unasyn 3 gm IV q6h as ordered  Jayce Boyko L 02/15/2011,11:36 AM

## 2011-02-15 NOTE — ED Notes (Signed)
[id number]<AV> Expected date:02/15/11<BR> Expected time:12:53 AM<BR> Means of arrival:Ambulance<BR> Comments:<BR> Transfer from [facility]

## 2011-02-15 NOTE — ED Notes (Signed)
Report called to [facility] ER .  Gave report to [place] for [employer]

## 2011-02-15 NOTE — Progress Notes (Signed)
Patient admitted by Dr. Joneen Roach this morning  Patient seen and examinded, database reviewed.  He is admitted for gallstone pancreatitis.  Currently receiving supportive management with IVF, pain mgmt, and antibiotics. Plans will be for cholecystectomy per [facility]. Will repeat lipase in am, cont NPO for today, possible trial of clear liquids in am if feeling better  Regarding patient's lung nodules, he wishes to follow up with Dr. Sherene Sires to discuss further work up.  This seems to be appropriate  His COPD is mild, will recommend incentive spirometry pre and post op May need to start nebs post operatively, as needed, no signs of bronchoconstriction at this time  Patient has no history of cardiac disease, CVA, renal disease or diabetes.  He would be considered low risk for a necessary surgery and can proceed when felt appropriate by [facility].

## 2011-02-16 LAB — COMPREHENSIVE METABOLIC PANEL
ALT: 26 U/L (ref 0–53)
AST: 41 U/L — ABNORMAL HIGH (ref 0–37)
Albumin: 2.7 g/dL — ABNORMAL LOW (ref 3.5–5.2)
Calcium: 8.8 mg/dL (ref 8.4–10.5)
Potassium: 3.1 mEq/L — ABNORMAL LOW (ref 3.5–5.1)
Sodium: 135 mEq/L (ref 135–145)
Total Protein: 6.5 g/dL (ref 6.0–8.3)

## 2011-02-16 LAB — AMYLASE: Amylase: 147 U/L — ABNORMAL HIGH (ref 0–105)

## 2011-02-16 LAB — CBC
HCT: 38.8 % — ABNORMAL LOW (ref 39.0–52.0)
Hemoglobin: 13.1 g/dL (ref 13.0–17.0)
MCH: 31.4 pg (ref 26.0–34.0)
MCHC: 33.8 g/dL (ref 30.0–36.0)
MCV: 93 fL (ref 78.0–100.0)

## 2011-02-16 MED ORDER — POTASSIUM CHLORIDE CRYS ER 20 MEQ PO TBCR
40.0000 meq | EXTENDED_RELEASE_TABLET | Freq: Every day | ORAL | Status: DC
  Administered 2011-02-16 – 2011-02-20 (×4): 40 meq via ORAL
  Filled 2011-02-16 (×5): qty 2

## 2011-02-16 MED ORDER — POTASSIUM CHLORIDE CRYS ER 20 MEQ PO TBCR
40.0000 meq | EXTENDED_RELEASE_TABLET | Freq: Once | ORAL | Status: AC
  Administered 2011-02-16: 40 meq via ORAL
  Filled 2011-02-16 (×2): qty 2

## 2011-02-16 NOTE — Progress Notes (Signed)
PCP: Willow Ora, MD, MD  Outpatient Care Team: Patient Care Team: Wanda Plump, MD as PCP - General Sandrea Hughs, MD as Consulting Physician (Pulmonary Disease) Eliezer Bottom., MD,FACG as Consulting Physician (Gastroenterology)  Inpatient Treatment Team: Treatment Team: Attending Provider: Erick Blinks; Consulting Physician: Md Ccs; Rounding Team: Alton Revere; Technician: Mal Misty, NT; Registered Nurse: Lanney Gins, RN; Registered Nurse: Janit Bern, RN   LOS: 2 days        Subjective:  Feels better Mildly "sensitive" stomach Tol PO liquids  Objective:  Vital signs:  Temp:  [98.1 F (36.7 C)-100.2 F (37.9 C)] 99.8 F (37.7 C) (11/25 0550) Pulse Rate:  [72-98] 72  (11/25 0550) Resp:  [18-20] 20  (11/25 0550) BP: (105-148)/(61-80) 122/67 mmHg (11/25 0550) SpO2:  [93 %-99 %] 99 % (11/25 0550) Weight:  [178 lb (80.74 kg)] 178 lb (80.74 kg) (11/24 1356) Last BM Date: 02/15/11  Intake/Output    from previous day: 11/24 0701 - 11/25 0700 In: 1606.7 [I.V.:1506.7; IV Piggyback:100] Out: -   this shift:    Flatus: little BM: Yes  Physical Exam:  General: Pt awake/alert/oriented x4 in no acute distress Eyes: PERRL, normal EOM.  Sclera clear.  No icterus Neuro: CN II-XII intact w/o focal sensory/motor deficits. Lymph: No head/neck/groin lymphadenopathy Psych:  No delerium/psychosis/paranoia HENT: Normocephalic, Mucus membranes moist.  No thrush Neck: Supple, No tracheal deviation Chest: Clear No chest wall pain w good excursion CV:  Pulses intact.  Regular rhythm Abdomen: Soft, Mildly tender epigastric but better.  Nondistended.  No incarcerated hernias. Ext:  SCDs BLE.  No mjr edema.  No cyanosis Skin: No petechiae / purpurae  Results:   Labs: Results for orders placed during the hospital encounter of 02/14/11 (from the past 48 hour(s))  URINALYSIS, ROUTINE W REFLEX MICROSCOPIC     Status: Abnormal   Collection Time   02/14/11   8:38 PM      Component Value Range Comment   Color, Urine YELLOW  YELLOW     Appearance CLEAR  CLEAR     Specific Gravity, Urine 1.020  1.005 - 1.030     pH 5.5  5.0 - 8.0     Glucose, UA NEGATIVE  NEGATIVE (mg/dL)    Hgb urine dipstick TRACE (*) NEGATIVE     Bilirubin Urine NEGATIVE  NEGATIVE     Ketones, ur NEGATIVE  NEGATIVE (mg/dL)    Protein, ur NEGATIVE  NEGATIVE (mg/dL)    Urobilinogen, UA 0.2  0.0 - 1.0 (mg/dL)    Nitrite NEGATIVE  NEGATIVE     Leukocytes, UA NEGATIVE  NEGATIVE    URINE MICROSCOPIC-ADD ON     Status: Normal   Collection Time   02/14/11  8:38 PM      Component Value Range Comment   Squamous Epithelial / LPF RARE  RARE     RBC / HPF 0-2  <3 (RBC/hpf)    Bacteria, UA RARE  RARE    CBC     Status: Abnormal   Collection Time   02/14/11  8:41 PM      Component Value Range Comment   WBC 12.9 (*) 4.0 - 10.5 (K/uL)    RBC 4.50  4.22 - 5.81 (MIL/uL)    Hemoglobin 14.0  13.0 - 17.0 (g/dL)    HCT 04.5  40.9 - 81.1 (%)    MCV 89.8  78.0 - 100.0 (fL)    MCH 31.1  26.0 - 34.0 (pg)  MCHC 34.7  30.0 - 36.0 (g/dL)    RDW 09.8  11.9 - 14.7 (%)    Platelets 155  150 - 400 (K/uL)   DIFFERENTIAL     Status: Abnormal   Collection Time   02/14/11  8:41 PM      Component Value Range Comment   Neutrophils Relative 86 (*) 43 - 77 (%)    Neutro Abs 11.1 (*) 1.7 - 7.7 (K/uL)    Lymphocytes Relative 6 (*) 12 - 46 (%)    Lymphs Abs 0.8  0.7 - 4.0 (K/uL)    Monocytes Relative 8  3 - 12 (%)    Monocytes Absolute 1.0  0.1 - 1.0 (K/uL)    Eosinophils Relative 0  0 - 5 (%)    Eosinophils Absolute 0.0  0.0 - 0.7 (K/uL)    Basophils Relative 0  0 - 1 (%)    Basophils Absolute 0.0  0.0 - 0.1 (K/uL)   COMPREHENSIVE METABOLIC PANEL     Status: Abnormal   Collection Time   02/14/11  8:41 PM      Component Value Range Comment   Sodium 136  135 - 145 (mEq/L)    Potassium 3.3 (*) 3.5 - 5.1 (mEq/L)    Chloride 99  96 - 112 (mEq/L)    CO2 23  19 - 32 (mEq/L)    Glucose, Bld 106  (*) 70 - 99 (mg/dL)    BUN 15  6 - 23 (mg/dL)    Creatinine, Ser 8.29  0.50 - 1.35 (mg/dL)    Calcium 9.3  8.4 - 10.5 (mg/dL)    Total Protein 8.0  6.0 - 8.3 (g/dL)    Albumin 3.7  3.5 - 5.2 (g/dL)    AST 41 (*) 0 - 37 (U/L)    ALT 22  0 - 53 (U/L)    Alkaline Phosphatase 117  39 - 117 (U/L)    Total Bilirubin 0.8  0.3 - 1.2 (mg/dL)    GFR calc non Af Amer 82 (*) >90 (mL/min)    GFR calc Af Amer >90  >90 (mL/min)   LIPASE, BLOOD     Status: Abnormal   Collection Time   02/14/11  8:41 PM      Component Value Range Comment   Lipase 2519 (*) 11 - 59 (U/L)   LIPID PANEL     Status: Abnormal   Collection Time   02/15/11  1:04 PM      Component Value Range Comment   Cholesterol 128  0 - 200 (mg/dL)    Triglycerides 562  <150 (mg/dL)    HDL 32 (*) >13 (mg/dL)    Total CHOL/HDL Ratio 4.0      VLDL 20  0 - 40 (mg/dL)    LDL Cholesterol 76  0 - 99 (mg/dL)   LIPASE, BLOOD     Status: Abnormal   Collection Time   02/16/11  4:47 AM      Component Value Range Comment   Lipase 299 (*) 11 - 59 (U/L)   AMYLASE     Status: Abnormal   Collection Time   02/16/11  4:47 AM      Component Value Range Comment   Amylase 147 (*) 0 - 105 (U/L)   COMPREHENSIVE METABOLIC PANEL     Status: Abnormal   Collection Time   02/16/11  4:47 AM      Component Value Range Comment   Sodium 135  135 - 145 (mEq/L)  Potassium 3.1 (*) 3.5 - 5.1 (mEq/L)    Chloride 100  96 - 112 (mEq/L)    CO2 26  19 - 32 (mEq/L)    Glucose, Bld 116 (*) 70 - 99 (mg/dL)    BUN 17  6 - 23 (mg/dL)    Creatinine, Ser 6.29  0.50 - 1.35 (mg/dL)    Calcium 8.8  8.4 - 10.5 (mg/dL)    Total Protein 6.5  6.0 - 8.3 (g/dL)    Albumin 2.7 (*) 3.5 - 5.2 (g/dL)    AST 41 (*) 0 - 37 (U/L)    ALT 26  0 - 53 (U/L)    Alkaline Phosphatase 81  39 - 117 (U/L)    Total Bilirubin 0.4  0.3 - 1.2 (mg/dL)    GFR calc non Af Amer 76 (*) >90 (mL/min)    GFR calc Af Amer 88 (*) >90 (mL/min)   CBC     Status: Abnormal   Collection Time   02/16/11   4:47 AM      Component Value Range Comment   WBC 7.2  4.0 - 10.5 (K/uL)    RBC 4.17 (*) 4.22 - 5.81 (MIL/uL)    Hemoglobin 13.1  13.0 - 17.0 (g/dL)    HCT 52.8 (*) 41.3 - 52.0 (%)    MCV 93.0  78.0 - 100.0 (fL)    MCH 31.4  26.0 - 34.0 (pg)    MCHC 33.8  30.0 - 36.0 (g/dL)    RDW 24.4  01.0 - 27.2 (%)    Platelets 130 (*) 150 - 400 (K/uL)     Imaging / Studies: @RISRSLT24 @  Antibiotics: Anti-infectives     Start     Dose/Rate Route Frequency Ordered Stop   02/15/11 1300   Ampicillin-Sulbactam (UNASYN) 3 g in sodium chloride 0.9 % 100 mL IVPB        3 g 100 mL/hr over 60 Minutes Intravenous 4 times per day 02/15/11 1300     02/15/11 0330   Ampicillin-Sulbactam (UNASYN) 3 g in sodium chloride 0.9 % 100 mL IVPB  Status:  Discontinued        3 g 100 mL/hr over 60 Minutes Intravenous Every 6 hours 02/15/11 0238 02/15/11 1300          Medications / Allergies: per chart  Assessment / Plan: Brian [name]  77 y.o. male        Problem List:  Principal Problem:  *Gallstone pancreatitis  -liquids  -lipase & pain down  -prob lap chole w IOC tomorrow  The anatomy & physiology of hepatobiliary & pancreatic function was discussed.  The pathophysiology of gallbladder dysfunction was discussed.  Natural history risks without surgery was discussed.   I feel the risks of no intervention will lead to serious problems that outweigh the operative risks; therefore, I recommended cholecystectomy to remove the pathology.  I explained laparoscopic techniques with possible need for an open approach.  Probable cholangiogram to evaluate the bilary tract was explained as well.    Risks such as bleeding, infection, abscess, leak, injury to other organs, need for further treatment, heart attack, death, and other risks were discussed.  I noted a good likelihood this will help address the problem.  Possibility that this will not correct all abdominal symptoms was explained.  Goals of post-operative  recovery were discussed as well.  We will work to minimize complications.  An educational handout further explaining the pathology and treatment options was given as well.  Questions  were answered.  The patient & family expresses understanding & wishes to proceed with surgery.  Active Problems:  B12 DEFICIENCY  HYPERTRIGLYCERIDEMIA  HYPERTENSION  GASTRITIS  CROHN'S DISEASE-LARGE & SMALL INTESTINE  COPD (chronic obstructive pulmonary disease)  -low K - PO replacement -VTE prophylaxis- SCDs, etc -mobilize as tolerated to help recovery  Lorenso Courier, M.D., F.A.C.S. Gastrointestinal and Minimally Invasive Surgery [facility] [address], [address] [place], [place] [address] [phone] Main / Paging [phone] Voice Mail   02/16/2011

## 2011-02-16 NOTE — Progress Notes (Signed)
Subjective: Feels better today, tolerating liquids, pain improved.  Objective:  Vital signs in last 24 hours:  Filed Vitals:   02/15/11 1356 02/15/11 1724 02/15/11 2150 02/16/11 0550  BP: 129/75 105/61 125/78 122/67  Pulse: 98 87 78 72  Temp: 99.8 F (37.7 C) 100.2 F (37.9 C) 98.1 F (36.7 C) 99.8 F (37.7 C)  TempSrc: Oral Oral Oral Oral  Resp: 18 18 18 20   Height: 6' (1.829 m)     Weight: 80.74 kg (178 lb)     SpO2: 93% 95% 98% 99%    Intake/Output from previous day:   Intake/Output Summary (Last 24 hours) at 02/16/11 1314 Last data filed at 02/16/11 1250  Gross per 24 hour  Intake 2926.7 ml  Output      0 ml  Net 2926.7 ml    Physical Exam: General: Alert, awake, oriented x3, in no acute distress. HEENT: No bruits, no goiter. Moist mucous membranes, no scleral icterus, no conjunctival pallor. Heart: Regular rate and rhythm, without murmurs, rubs, gallops. Lungs: Clear to auscultation bilaterally. No wheezing, no rhonchi, no rales.  Abdomen: Soft, mild tenderness in the epigastrium, nondistended, positive bowel sounds. Extremities: No clubbing cyanosis or edema,  positive pedal pulses. Neuro: Grossly intact, nonfocal.    Lab Results:  Basic Metabolic Panel:    Component Value Date/Time   NA 135 02/16/2011 0447   K 3.1* 02/16/2011 0447   CL 100 02/16/2011 0447   CO2 26 02/16/2011 0447   BUN 17 02/16/2011 0447   CREATININE 0.97 02/16/2011 0447   GLUCOSE 116* 02/16/2011 0447   CALCIUM 8.8 02/16/2011 0447   CBC:    Component Value Date/Time   WBC 7.2 02/16/2011 0447   HGB 13.1 02/16/2011 0447   HCT 38.8* 02/16/2011 0447   PLT 130* 02/16/2011 0447   MCV 93.0 02/16/2011 0447   NEUTROABS 11.1* 02/14/2011 2041   LYMPHSABS 0.8 02/14/2011 2041   MONOABS 1.0 02/14/2011 2041   EOSABS 0.0 02/14/2011 2041   BASOSABS 0.0 02/14/2011 2041      Lab 02/16/11 0447 02/14/11 2041  WBC 7.2 12.9*  HGB 13.1 14.0  HCT 38.8* 40.4  PLT 130* 155  MCV 93.0 89.8    MCH 31.4 31.1  MCHC 33.8 34.7  RDW 12.9 12.5  LYMPHSABS -- 0.8  MONOABS -- 1.0  EOSABS -- 0.0  BASOSABS -- 0.0  BANDABS -- --    Lab 02/16/11 0447 02/14/11 2041  NA 135 136  K 3.1* 3.3*  CL 100 99  CO2 26 23  GLUCOSE 116* 106*  BUN 17 15  CREATININE 0.97 0.80  CALCIUM 8.8 9.3  MG -- --   No results found for this basename: INR:5,PROTIME:5 in the last 168 hours Cardiac markers: No results found for this basename: CK:3,CKMB:3,TROPONINI:3,MYOGLOBIN:3 in the last 168 hours No results found for this basename: POCBNP:3 in the last 168 hours No results found for this or any previous visit (from the past 240 hour(s)).  Studies/Results: Dg Chest 2 View  02/14/2011  *RADIOLOGY REPORT*  Clinical Data: Pain, fever, chills, emesis, right upper quadrant pain, leukocytosis, history COPD, hypertension, Crohn's disease  CHEST - 2 VIEW  Comparison: 09/04/2010  Findings: Enlargement of cardiac silhouette. Tortuous aorta. Mild pulmonary vascular congestion. Emphysematous changes with calcified granuloma left upper lobe. No definite pulmonary infiltrate or pleural effusion. Chronic accentuation of left basilar interstitial markings stable. No pneumothorax. Bones unremarkable.  IMPRESSION: Cardiomegaly with minimal chronic pulmonary vascular congestion. Emphysematous changes with left basilar fibrosis. No acute  abnormalities.  Original Report Authenticated By: Lollie Marrow, M.D.   Ct Abdomen Pelvis W Contrast  02/14/2011  **ADDENDUM** CREATED: 02/14/2011 23:33:36  The patient is status post appendectomy.  The first point in the impression should read:  1.  Suspect mild acute cholecystitis, with mild apparent gallbladder wall thickening and inflammation; underlying cholelithiasis again noted.  No evidence to suggest obstruction.  **END ADDENDUM** SIGNED BY: Tonia Ghent, M.D.   02/14/2011  *RADIOLOGY REPORT*  Clinical Data: Right upper quadrant abdominal pain, chills and emesis.  Leukocytosis.  CT  ABDOMEN AND PELVIS WITH CONTRAST  Technique:  Multidetector CT imaging of the abdomen and pelvis was performed following the standard protocol during bolus administration of intravenous contrast.  Contrast: OMNIPAQUE IOHEXOL 300 MG/ML IV SOLN  Comparison: CT of the abdomen and pelvis performed 03/01/2009, and abdominal radiograph performed 03/05/2009  Findings: Multiple small confluent nodules are noted within the posterior right middle lobe, along the right major fissure, measuring 1.7 cm in size.  Though this could be infectious in nature, malignancy cannot be excluded.  Mild chronic fibrotic change is seen at both lung bases.  There is mild diffuse fatty infiltration within the liver, with mild sparing about the gallbladder fossa.  The spleen is unremarkable in appearance.  There is mild apparent gallbladder wall thickening and mild associated inflammation; stones are again noted layering dependently within the gallbladder.  This could reflect mild cholecystitis.  There is no evidence of distension of the common hepatic duct or intrahepatic biliary ducts to suggest obstruction.  The pancreas and adrenal glands are unremarkable in appearance.  Nonspecific perinephric stranding is noted bilaterally, similar in appearance to the prior study.  Bilateral renal cysts are seen, measuring up to 3.0 cm in size.  There is no evidence of hydronephrosis.  No renal or ureteral stones are identified.  No free fluid is identified.  The small bowel is unremarkable in appearance.  The stomach is within normal limits.  No acute vascular abnormalities are seen.  Scattered calcification is noted along the distal abdominal aorta and its branches.  The patient is status post appendectomy, with scattered postoperative change noted at the right lower quadrant.  The colon is unremarkable in appearance.  The bladder is moderately distended and unremarkable in appearance. There is borderline prominence of the prostate, measuring 4.8  cm in transverse dimension.  No inguinal lymphadenopathy is seen.  No acute osseous abnormalities are identified.  IMPRESSION:  1.  Suspect mild acute appendicitis, with mild apparent gallbladder wall thickening and inflammation; underlying cholelithiasis again noted.  No evidence to suggest obstruction. 2.  Bilateral renal cysts, measuring up to 3.0 cm in size. 3.  Borderline prominence of the prostate. 4.  Mild diffuse fatty infiltration within the liver. 5.  Mild chronic fibrotic change noted at both lung bases. 6.  Multiple small confluent nodules noted within the posterior right middle lung lobe, along the right major fissure, measuring 1.7 cm.  Though this could be infectious in nature, malignancy cannot be excluded.  Suggest correlation for signs of lung infection; PET/CT would be helpful for further evaluation if no definite pneumonia is present, when and as deemed clinically appropriate.  Original Report Authenticated By: Tonia Ghent, M.D.    Medications: Scheduled Meds:   . amLODipine  2.5 mg Oral Daily  . ampicillin-sulbactam (UNASYN) IV  3 g Intravenous Q6H  . busPIRone  15 mg Oral BID  . enoxaparin  40 mg Subcutaneous Q24H  . Flora-Q  1  capsule Oral Daily  . gemfibrozil  600 mg Oral BID AC  . lip balm  1 application Topical BID  . mesalamine  500 mg Oral BID  . potassium chloride  40 mEq Oral Daily  . propranolol  80 mg Oral BID  . psyllium  1 packet Oral BID  . DISCONTD: propranolol  80 mg Oral BID   Continuous Infusions:   . dextrose 5 % and 0.45 % NaCl with KCl 20 mEq/L 100 mL/hr at 02/16/11 0932   PRN Meds:.acetaminophen, bisacodyl, diphenhydrAMINE, diphenhydrAMINE, guaiFENesin-dextromethorphan, hydrocortisone-pramoxine, magic mouthwash, morphine, ondansetron (ZOFRAN) IV, ondansetron, ondansetron, promethazine, witch hazel-glycerin, zolpidem  Assessment/Plan:  Gallstone pancreatitis, lipase trending down, tolerating liquids, plans for cholecystectomy tomorrow  COPD,  stable, encouraged incentive spirometry  Lung Nodules, Follow up with Dr. Sherene Sires for further work up as an outpatient.  HTN, stable     LOS: 2 days   MEMON,JEHANZEB 02/16/2011, 1:14 PM

## 2011-02-17 ENCOUNTER — Encounter (HOSPITAL_COMMUNITY): Payer: Self-pay

## 2011-02-17 ENCOUNTER — Other Ambulatory Visit: Payer: Self-pay

## 2011-02-17 ENCOUNTER — Inpatient Hospital Stay (HOSPITAL_COMMUNITY): Payer: Medicare Other | Admitting: Anesthesiology

## 2011-02-17 ENCOUNTER — Encounter (HOSPITAL_COMMUNITY)
Admission: EM | Disposition: A | Discharge status: Home / Self Care | Payer: Self-pay | Source: Home / Self Care | Attending: Internal Medicine

## 2011-02-17 ENCOUNTER — Encounter (HOSPITAL_COMMUNITY): Payer: Self-pay | Admitting: Anesthesiology

## 2011-02-17 ENCOUNTER — Inpatient Hospital Stay (HOSPITAL_COMMUNITY): Payer: Medicare Other

## 2011-02-17 ENCOUNTER — Other Ambulatory Visit (INDEPENDENT_AMBULATORY_CARE_PROVIDER_SITE_OTHER): Payer: Self-pay | Admitting: General Surgery

## 2011-02-17 DIAGNOSIS — K812 Acute cholecystitis with chronic cholecystitis: Secondary | ICD-10-CM

## 2011-02-17 DIAGNOSIS — E876 Hypokalemia: Secondary | ICD-10-CM | POA: Diagnosis not present

## 2011-02-17 DIAGNOSIS — I4891 Unspecified atrial fibrillation: Secondary | ICD-10-CM

## 2011-02-17 HISTORY — PX: CHOLECYSTECTOMY: SHX55

## 2011-02-17 LAB — LIPASE, BLOOD: Lipase: 451 U/L — ABNORMAL HIGH (ref 11–59)

## 2011-02-17 LAB — CBC
MCH: 31.1 pg (ref 26.0–34.0)
MCV: 93 fL (ref 78.0–100.0)
Platelets: 151 10*3/uL (ref 150–400)
RBC: 4.44 MIL/uL (ref 4.22–5.81)
RDW: 12.8 % (ref 11.5–15.5)
WBC: 5.5 10*3/uL (ref 4.0–10.5)

## 2011-02-17 LAB — HEPATIC FUNCTION PANEL
ALT: 35 U/L (ref 0–53)
Bilirubin, Direct: 0.1 mg/dL (ref 0.0–0.3)
Indirect Bilirubin: 0.2 mg/dL — ABNORMAL LOW (ref 0.3–0.9)
Total Protein: 7.3 g/dL (ref 6.0–8.3)

## 2011-02-17 LAB — MAGNESIUM: Magnesium: 1.9 mg/dL (ref 1.5–2.5)

## 2011-02-17 LAB — BASIC METABOLIC PANEL
CO2: 25 mEq/L (ref 19–32)
Calcium: 9.2 mg/dL (ref 8.4–10.5)
Creatinine, Ser: 0.96 mg/dL (ref 0.50–1.35)
GFR calc Af Amer: 88 mL/min — ABNORMAL LOW (ref >90)
Sodium: 136 mEq/L (ref 135–145)

## 2011-02-17 SURGERY — LAPAROSCOPIC CHOLECYSTECTOMY WITH INTRAOPERATIVE CHOLANGIOGRAM
Anesthesia: General | Site: Abdomen | Wound class: Clean Contaminated

## 2011-02-17 MED ORDER — BUPIVACAINE-EPINEPHRINE 0.25% -1:200000 IJ SOLN
INTRAMUSCULAR | Status: DC | PRN
  Administered 2011-02-17: 14 mL

## 2011-02-17 MED ORDER — DEXAMETHASONE SODIUM PHOSPHATE 4 MG/ML IJ SOLN
8.0000 mg | Freq: Once | INTRAMUSCULAR | Status: DC | PRN
  Filled 2011-02-17: qty 2

## 2011-02-17 MED ORDER — NEOSTIGMINE METHYLSULFATE 1 MG/ML IJ SOLN
INTRAMUSCULAR | Status: DC | PRN
  Administered 2011-02-17: 4 mg via INTRAVENOUS

## 2011-02-17 MED ORDER — SODIUM CHLORIDE 0.9 % IV SOLN
3.0000 g | Freq: Four times a day (QID) | INTRAVENOUS | Status: AC
  Administered 2011-02-17 – 2011-02-18 (×3): 3 g via INTRAVENOUS
  Filled 2011-02-17 (×5): qty 3

## 2011-02-17 MED ORDER — LACTATED RINGERS IV SOLN
INTRAVENOUS | Status: DC
  Administered 2011-02-17: 1000 mL via INTRAVENOUS

## 2011-02-17 MED ORDER — LIDOCAINE HCL (CARDIAC) 20 MG/ML IV SOLN
INTRAVENOUS | Status: DC | PRN
  Administered 2011-02-17: 50 mg via INTRAVENOUS

## 2011-02-17 MED ORDER — LACTATED RINGERS IV SOLN
INTRAVENOUS | Status: DC | PRN
  Administered 2011-02-17 (×2): via INTRAVENOUS

## 2011-02-17 MED ORDER — ROCURONIUM BROMIDE 100 MG/10ML IV SOLN
INTRAVENOUS | Status: DC | PRN
  Administered 2011-02-17: 35 mg via INTRAVENOUS

## 2011-02-17 MED ORDER — FENTANYL CITRATE 0.05 MG/ML IJ SOLN
25.0000 ug | INTRAMUSCULAR | Status: DC | PRN
  Administered 2011-02-17 (×3): 25 ug via INTRAVENOUS

## 2011-02-17 MED ORDER — IOHEXOL 300 MG/ML  SOLN
INTRAMUSCULAR | Status: DC | PRN
  Administered 2011-02-17: 10 mL via INTRAVENOUS

## 2011-02-17 MED ORDER — SODIUM CHLORIDE 0.45 % IV SOLN
INTRAVENOUS | Status: DC
  Administered 2011-02-17 (×2): via INTRAVENOUS
  Administered 2011-02-18: 75 mL/h via INTRAVENOUS
  Administered 2011-02-19: 05:00:00 via INTRAVENOUS

## 2011-02-17 MED ORDER — ONDANSETRON HCL 4 MG/2ML IJ SOLN
INTRAMUSCULAR | Status: DC | PRN
  Administered 2011-02-17: 4 mg via INTRAVENOUS

## 2011-02-17 MED ORDER — POTASSIUM CHLORIDE 10 MEQ/100ML IV SOLN
10.0000 meq | INTRAVENOUS | Status: AC
  Administered 2011-02-17 (×2): 10 meq via INTRAVENOUS
  Filled 2011-02-17 (×6): qty 100

## 2011-02-17 MED ORDER — LACTATED RINGERS IV SOLN
INTRAVENOUS | Status: DC | PRN
  Administered 2011-02-17: 1000 mL via INTRAVENOUS

## 2011-02-17 MED ORDER — LACTATED RINGERS IV SOLN: INTRAVENOUS | Status: DC

## 2011-02-17 MED ORDER — FENTANYL CITRATE 0.05 MG/ML IJ SOLN
INTRAMUSCULAR | Status: DC | PRN
  Administered 2011-02-17: 100 ug via INTRAVENOUS

## 2011-02-17 MED ORDER — PROPOFOL 10 MG/ML IV EMUL
INTRAVENOUS | Status: DC | PRN
  Administered 2011-02-17: 150 mg via INTRAVENOUS

## 2011-02-17 MED ORDER — MEPERIDINE HCL 50 MG/ML IJ SOLN: 6.2500 mg | INTRAMUSCULAR | Status: DC | PRN

## 2011-02-17 MED ORDER — GLYCOPYRROLATE 0.2 MG/ML IJ SOLN
INTRAMUSCULAR | Status: DC | PRN
  Administered 2011-02-17: .6 mg via INTRAVENOUS

## 2011-02-17 MED ORDER — POTASSIUM CHLORIDE 10 MEQ/100ML IV SOLN
10.0000 meq | INTRAVENOUS | Status: AC
  Administered 2011-02-17 (×2): 10 meq via INTRAVENOUS
  Filled 2011-02-17 (×2): qty 100

## 2011-02-17 MED ORDER — SUCCINYLCHOLINE CHLORIDE 20 MG/ML IJ SOLN
INTRAMUSCULAR | Status: DC | PRN
  Administered 2011-02-17: 100 mg via INTRAVENOUS

## 2011-02-17 SURGICAL SUPPLY — 47 items
ADH SKN CLS APL DERMABOND .7 (GAUZE/BANDAGES/DRESSINGS)
APL SKNCLS STERI-STRIP NONHPOA (GAUZE/BANDAGES/DRESSINGS)
APPLIER CLIP ROT 10 11.4 M/L (STAPLE) ×2
APR CLP MED LRG 11.4X10 (STAPLE) ×1
BAG SPEC RTRVL LRG 6X4 10 (ENDOMECHANICALS)
BENZOIN TINCTURE PRP APPL 2/3 (GAUZE/BANDAGES/DRESSINGS) IMPLANT
CANISTER SUCTION 2500CC (MISCELLANEOUS) ×2 IMPLANT
CATH REDDICK CHOLANGI 4FR 50CM (CATHETERS) IMPLANT
CLIP APPLIE ROT 10 11.4 M/L (STAPLE) ×1 IMPLANT
CLOTH BEACON ORANGE TIMEOUT ST (SAFETY) ×2 IMPLANT
CORD HIGH FREQUENCY UNIPOLAR (ELECTROSURGICAL) ×1 IMPLANT
COVER MAYO STAND STRL (DRAPES) ×2 IMPLANT
DECANTER SPIKE VIAL GLASS SM (MISCELLANEOUS) ×2 IMPLANT
DERMABOND ADVANCED (GAUZE/BANDAGES/DRESSINGS)
DERMABOND ADVANCED .7 DNX12 (GAUZE/BANDAGES/DRESSINGS) IMPLANT
DRAIN CHANNEL RND F F (WOUND CARE) ×1 IMPLANT
DRAPE C-ARM 42X72 X-RAY (DRAPES) ×2 IMPLANT
DRAPE LAPAROSCOPIC ABDOMINAL (DRAPES) ×2 IMPLANT
ELECT REM PT RETURN 9FT ADLT (ELECTROSURGICAL) ×2
ELECTRODE REM PT RTRN 9FT ADLT (ELECTROSURGICAL) ×1 IMPLANT
EVACUATOR SILICONE 100CC (DRAIN) ×1 IMPLANT
GLOVE BIOGEL PI IND STRL 7.0 (GLOVE) ×1 IMPLANT
GLOVE BIOGEL PI INDICATOR 7.0 (GLOVE) ×1
GLOVE SS BIOGEL STRL SZ 7.5 (GLOVE) ×1 IMPLANT
GLOVE SUPERSENSE BIOGEL SZ 7.5 (GLOVE) ×1
GOWN STRL NON-REIN LRG LVL3 (GOWN DISPOSABLE) ×2 IMPLANT
GOWN STRL REIN XL XLG (GOWN DISPOSABLE) ×4 IMPLANT
HEMOSTAT SURGICEL 4X8 (HEMOSTASIS) IMPLANT
IV CATH 14GX2 1/4 (CATHETERS) IMPLANT
IV SET EXT 30 76VOL 4 MALE LL (IV SETS) IMPLANT
KIT BASIN OR (CUSTOM PROCEDURE TRAY) ×2 IMPLANT
NS IRRIG 1000ML POUR BTL (IV SOLUTION) IMPLANT
POUCH SPECIMEN RETRIEVAL 10MM (ENDOMECHANICALS) IMPLANT
SET CHOLANGIOGRAPH MIX (MISCELLANEOUS) ×2 IMPLANT
SET IRRIG TUBING LAPAROSCOPIC (IRRIGATION / IRRIGATOR) ×2 IMPLANT
SLEEVE Z-THREAD 5X100MM (TROCAR) ×2 IMPLANT
SOLUTION ANTI FOG 6CC (MISCELLANEOUS) ×2 IMPLANT
STOPCOCK K 69 2C6206 (IV SETS) IMPLANT
STRIP CLOSURE SKIN 1/2X4 (GAUZE/BANDAGES/DRESSINGS) IMPLANT
SUT ETHILON 2 0 PS N (SUTURE) ×1 IMPLANT
SUT MNCRL AB 4-0 PS2 18 (SUTURE) ×2 IMPLANT
TOWEL OR 17X26 10 PK STRL BLUE (TOWEL DISPOSABLE) ×2 IMPLANT
TRAY LAP CHOLE (CUSTOM PROCEDURE TRAY) ×2 IMPLANT
TROCAR HASSON GELL 12X100 (TROCAR) ×2 IMPLANT
TROCAR Z-THREAD FIOS 11X100 BL (TROCAR) ×2 IMPLANT
TROCAR Z-THREAD FIOS 5X100MM (TROCAR) ×2 IMPLANT
TUBING INSUFFLATION 10FT LAP (TUBING) ×2 IMPLANT

## 2011-02-17 NOTE — Op Note (Signed)
  Surgeon: Glenna Fellows T   Assistants: none  Anesthesia: General endotracheal anesthesia  Indications: patient is an 77 -year-old male who presented with acute midabdominal pain. CT scan showed multiple gallstones and a mildly thickened gallbladder wall. He had significantly elevated lipase indicating acute pancreatitis. His symptoms have resolved and we have recommended proceeding with laparoscopic cholecystectomy and cholangiogram to prevent further episodes. The nature of the procedure, its indications, and risks have been discussed with the patient as detailed elsewhere.    Procedure Detail: patient is brought to the operating room and general endotracheal anesthesia induced. The abdomen was widely sterilely prepped and draped. He received preoperative IV antibiotics. Patient timeout and correct procedure were confirmed. Access was obtained with an open Hassan cannula at the umbilicus without difficulty. There were minimal adhesions in the right lower quadrant from previous surgery. The gallbladder was noted to be somewhat thickened and mildly acutely inflamed and edematous. The fundus was grasped and elevated up over the liver and the infundibulum retracted inferolaterally. Peritoneum anterior and posterior to close triangle was incised and fibrofatty tissue was stripped away from the gallbladder toward the porta hepatus. An anterior branch of the cystic artery clearly seen coursing onto the gallbladder wall was divided between clips. The cystic duct gallbladder junction was identified and dissected 360 and the cystic duct dissected out over about a centimeter and a half with a good critical view obtained. The cystic duct was mildly dilated consistent with the patient's history of passed common bile duct stone. The cystic duct was clipped at the gallbladder junction and operative cholangiogram was performed. Despite clearly having the catheter in the cystic duct the contrast did not enter  the common bile duct either pooling at the operative site or possibly filling the gallbladder. After 2 attempts I felt that further attempts to injure the cystic duct. I removed the Cholangiocath and the cystic duct was triply clipped proximally and divided. The gallbladder was then dissected free from its bed using hook cautery. A small posterior branch of the cystic artery was clipped up in the gallbladder bed. The gallbladder was detached and placed in an Endo Catch bag and removed. The gallbladder fossa was irrigated and complete hemostasis assured. There was no evidence of trocar injury or other problems. Due to possible small stone obstructing the distal cystic duct I elected to leave a close suction drain and a 19 Blake drain was left in the gallbladder fossa and brought out through a lateral trocar site. All trochars were removed and CO2 evacuated. The mattress suture was secured at the umbilicus. The skin incisions were closed with Dermabond and subcuticular Monocryl. Sponge needle and instrument counts were correct. The patient was taken to the PACU in good condition.    Findings: Edematous, mildly inflamed GB.  Cholangiogram not performed due to technical problems   ]Estimated Blood Loss:  Minimal         Drains: JACKSON-PRATT (JP)          Blood Given: none          Specimens: GB and contents        Complications:  Atrial fib noted in ER         Disposition: PACU - hemodynamically stable.         Condition: stable  Mariella Saa MD, FACS  02/17/2011, 1:46 PM

## 2011-02-17 NOTE — Anesthesia Preprocedure Evaluation (Signed)
Anesthesia Evaluation  Patient identified by MRN, date of birth, ID band Patient awake    Reviewed: Allergy & Precautions, H&P , NPO status , Patient's Chart, lab work & pertinent test results  Airway Mallampati: II TM Distance: >3 FB Neck ROM: Full    Dental No notable dental hx. (+) Edentulous Upper and Edentulous Lower   Pulmonary neg pulmonary ROS, COPD (mild)former smoker clear to auscultation  Pulmonary exam normal       Cardiovascular hypertension, Pt. on medications neg cardio ROS Regular Normal    Neuro/Psych  Neuromuscular disease Negative Neurological ROS  Negative Psych ROS   GI/Hepatic negative GI ROS, Neg liver ROS, hiatal hernia,   Endo/Other  Negative Endocrine ROS  Renal/GU negative Renal ROS  Genitourinary negative   Musculoskeletal negative musculoskeletal ROS (+)   Abdominal   Peds negative pediatric ROS (+)  Hematology negative hematology ROS (+)   Anesthesia Other Findings   Reproductive/Obstetrics negative OB ROS                           Anesthesia Physical Anesthesia Plan  ASA: II  Anesthesia Plan: General   Post-op Pain Management:    Induction: Intravenous  Airway Management Planned: Oral ETT  Additional Equipment:   Intra-op Plan:   Post-operative Plan: Extubation in OR  Informed Consent: I have reviewed the patients History and Physical, chart, labs and discussed the procedure including the risks, benefits and alternatives for the proposed anesthesia with the patient or authorized representative who has indicated his/her understanding and acceptance.   Dental advisory given  Plan Discussed with: CRNA  Anesthesia Plan Comments:         Anesthesia Quick Evaluation

## 2011-02-17 NOTE — Anesthesia Postprocedure Evaluation (Signed)
  Anesthesia Post-op Note  Patient: Brian [name]  Procedure(s) Performed:  LAPAROSCOPIC CHOLECYSTECTOMY WITH INTRAOPERATIVE CHOLANGIOGRAM  Patient Location: PACU  Anesthesia Type: General  Level of Consciousness: awake and alert   Airway and Oxygen Therapy: Patient Spontanous Breathing  Post-op Pain: mild  Post-op Assessment: Post-op Vital signs reviewed, Patient's Cardiovascular Status Stable, Respiratory Function Stable, Patent Airway and No signs of Nausea or vomiting  Post-op Vital Signs: stable  Complications: No apparent anesthesia complications. New onset afib noted on ekg. Medicine or cards to see patient per Dr Johna Sheriff.

## 2011-02-17 NOTE — Transfer of Care (Signed)
Immediate Anesthesia Transfer of Care Note  Patient: Brian [name]  Procedure(s) Performed:  LAPAROSCOPIC CHOLECYSTECTOMY WITH INTRAOPERATIVE CHOLANGIOGRAM  Patient Location: PACU  Anesthesia Type: General  Level of Consciousness: awake and alert   Airway & Oxygen Therapy: Patient Spontanous Breathing and Patient connected to face mask oxygen  Post-op Assessment: Report given to PACU RN and Post -op Vital signs reviewed and stable  Post vital signs: Reviewed and stable  Complications: No apparent anesthesia complications

## 2011-02-17 NOTE — Progress Notes (Signed)
Subjective: No events overnight. Feels better today, tolerated liquids today, only slight abd pain now.  Objective:  Vital signs in last 24 hours:  Filed Vitals:   02/16/11 0550 02/16/11 1400 02/16/11 2220 02/17/11 0525  BP: 122/67 112/65 101/60 132/71  Pulse: 72 66 71 73  Temp: 99.8 F (37.7 C) 98 F (36.7 C) 98.9 F (37.2 C) 98 F (36.7 C)  TempSrc: Oral Oral Oral Oral  Resp: 20 18 20 20   Height:      Weight:      SpO2: 99% 97% 95% 95%    Intake/Output from previous day:   Intake/Output Summary (Last 24 hours) at 02/17/11 0813 Last data filed at 02/17/11 0600  Gross per 24 hour  Intake 4413.33 ml  Output      0 ml  Net 4413.33 ml    Physical Exam: General: Alert, awake, oriented x3, in no acute distress. HEENT: No bruits, no goiter. Moist mucous membranes, no scleral icterus, no conjunctival pallor. Heart: Regular rate and rhythm, without murmurs, rubs, gallops. Lungs: Clear to auscultation bilaterally. No wheezing, no rhonchi, no rales.  Abdomen: Soft, mild tenderness in epigastrium, nondistended, positive bowel sounds. Extremities: No clubbing cyanosis or edema,  positive pedal pulses. Neuro: Grossly intact, nonfocal.    Lab Results:  Basic Metabolic Panel:    Component Value Date/Time   NA 136 02/17/2011 0356   K 3.0* 02/17/2011 0356   CL 101 02/17/2011 0356   CO2 25 02/17/2011 0356   BUN 11 02/17/2011 0356   CREATININE 0.96 02/17/2011 0356   GLUCOSE 109* 02/17/2011 0356   CALCIUM 9.2 02/17/2011 0356   CBC:    Component Value Date/Time   WBC 5.5 02/17/2011 0356   HGB 13.8 02/17/2011 0356   HCT 41.3 02/17/2011 0356   PLT 151 02/17/2011 0356   MCV 93.0 02/17/2011 0356   NEUTROABS 11.1* 02/14/2011 2041   LYMPHSABS 0.8 02/14/2011 2041   MONOABS 1.0 02/14/2011 2041   EOSABS 0.0 02/14/2011 2041   BASOSABS 0.0 02/14/2011 2041      Lab 02/17/11 0356 02/16/11 0447 02/14/11 2041  WBC 5.5 7.2 12.9*  HGB 13.8 13.1 14.0  HCT 41.3 38.8* 40.4  PLT  151 130* 155  MCV 93.0 93.0 89.8  MCH 31.1 31.4 31.1  MCHC 33.4 33.8 34.7  RDW 12.8 12.9 12.5  LYMPHSABS -- -- 0.8  MONOABS -- -- 1.0  EOSABS -- -- 0.0  BASOSABS -- -- 0.0  BANDABS -- -- --    Lab 02/17/11 0356 02/16/11 0447 02/14/11 2041  NA 136 135 136  K 3.0* 3.1* 3.3*  CL 101 100 99  CO2 25 26 23   GLUCOSE 109* 116* 106*  BUN 11 17 15   CREATININE 0.96 0.97 0.80  CALCIUM 9.2 8.8 9.3  MG 1.9 -- --   No results found for this basename: INR:5,PROTIME:5 in the last 168 hours Cardiac markers: No results found for this basename: CK:3,CKMB:3,TROPONINI:3,MYOGLOBIN:3 in the last 168 hours No results found for this basename: POCBNP:3 in the last 168 hours No results found for this or any previous visit (from the past 240 hour(s)).  Studies/Results: No results found.  Medications: Scheduled Meds:   . amLODipine  2.5 mg Oral Daily  . ampicillin-sulbactam (UNASYN) IV  3 g Intravenous Q6H  . busPIRone  15 mg Oral BID  . enoxaparin  40 mg Subcutaneous Q24H  . Flora-Q  1 capsule Oral Daily  . gemfibrozil  600 mg Oral BID AC  . lip balm  1 application Topical BID  . mesalamine  500 mg Oral BID  . potassium chloride  40 mEq Oral Daily  . potassium chloride  40 mEq Oral Once  . propranolol  80 mg Oral BID  . psyllium  1 packet Oral BID   Continuous Infusions:   . dextrose 5 % and 0.45 % NaCl with KCl 20 mEq/L 100 mL/hr at 02/17/11 0010   PRN Meds:.acetaminophen, bisacodyl, diphenhydrAMINE, diphenhydrAMINE, guaiFENesin-dextromethorphan, hydrocortisone-pramoxine, magic mouthwash, morphine, ondansetron (ZOFRAN) IV, ondansetron, ondansetron, promethazine, witch hazel-glycerin, zolpidem  Assessment/Plan: Gallstone pancreatitis, plans for possible cholecystectomy today, tolerating liquids, clinically improving.   COPD, stable, encouraged incentive spirometry   Lung Nodules, Follow up with Dr. Sherene Sires for further work up as an outpatient.   HTN, stable  Hypokalemia: replete    LOS: 3 days   Brian [name] 02/17/2011, 8:13 AM

## 2011-02-17 NOTE — Consult Note (Signed)
Patient ID: DEANDRAE WAJDA MRN: [id number], DOB/AGE: 75-Aug-1930   Admit date: 02/14/2011 Date of Consult: 02/17/2011 3:31 PM  Primary Physician: Willow Ora, MD, MD Primary Cardiologist: New to [place], Consulted by Dr. Daleen Squibb  Pt. Profile: 77yom w/ PMHx HTN and hypertriglyceridemia who presented to [facility] for abdominal pain and is s/p laparoscopic cholecystectomy. Cardiology is being consulted for new onset post-op Atrial fibrillation.  Patient Active Hospital Problem List: Atrial Fibrillation w/ controlled ventricular response Gallstone pancreatitis s/p laparoscopic cholecystectomy HYPERTRIGLYCERIDEMIA  HYPERTENSION  COPD (chronic obstructive pulmonary disease) Hypokalemia   Past Medical History  Diagnosis Date  . Muscle tremor     saw neurology remotely elsewhere, was Rx inderal  . Crohn's ileocolitis   . Osteopenia     per DEXA 11/2008  . Hypertension 11/11    mild  . Hemorrhoids   . Hypertrophy of prostate     w/o UR obst & oth luts  . Peripheral neuropathy     h/o neg w/u  . Osteoarthritis     ankle,right  . ED (erectile dysfunction)   . Tremor   . Hypertriglyceridemia   . B12 deficiency   . Gallstones   . COPD (chronic obstructive pulmonary disease)     recent dx--no acute problems  . Shortness of breath     at times with w/excertion  . Cancer     squamous cell skin cancers-tx'd q32yrs  . Hiatal hernia 1956    found while in service  . Anxiety     Past Surgical History  Procedure Date  . Resection of distal ileum and cecum w/ appendectomy     18-inch small intestine, 1984 for Crohn's disease  . Colon surgery 1983    has chron's  . Appendectomy 1983     Allergies: No Known Allergies  HPI: 77yom w/ PMHx HTN and hypertriglyceridemia who presented to [facility] for abdominal pain and is now s/p laparoscopic cholecystectomy.   Cardiology is consulted due to new onset post-op atrial fibrillation. He denies any history of an irregular heart beat and states his only  cardiac history is hypertension for which he was diagnosed within the last year and is taking Norvasc 2.5mg . He has taken propranolol 80mg  BID for many years for tremors. It was noted on post-op EKG that the patient was in atrial fibrillation with controlled ventricular response. Patient denies any chest pain, shortness of breath, or palpitations. He was noted to be hypokalemic with K+ 3.0 pre-op and is currently receiving IV and po KCL supplementation. TSH and Mg levels are pending.  Outpatient Medications:  Medication Sig  amLODipine (NORVASC) 2.5 MG tablet TAKE 1 TABLET ONCE DAILY  busPIRone (BUSPAR) 15 MG tablet TAKE 1 TABLET TWICE A DAY  Calcium Carbonate-Vitamin D (CALTRATE 600+D PO) Take 1 tablet by mouth daily.   clobetasol (TEMOVATE) 0.05 % cream APPLY TO KNEES TWICE A DAY AS NEEDED  Cyanocobalamin (NASCOBAL) 500 MCG/0.1ML SOLN    Flaxseed, Linseed, (FLAX SEED OIL) 1000 MG CAPS Take 1 capsule by mouth 2 (two) times daily.   gemfibrozil (LOPID) 600 MG tablet TAKE 1 TABLET TWICE A DAY  hydrocortisone (ANUSOL-HC) 25 MG suppository Place 25 mg rectally 2 (two) times daily as needed. As directed if needed  Lidocaine-Hydrocortisone Ace 3-0.5 % CREA Apply topically. As needed  PENTASA 250 MG CR capsule TAKE 2 CAPSULES TWICE A DAY  propranolol (INDERAL) 80 MG tablet TAKE 1 TABLET TWICE A DAY  sildenafil (VIAGRA) 25 MG tablet Take 25 mg by mouth daily  as needed.     Inpatient Medications:   . amLODipine  2.5 mg Oral Daily  . ampicillin-sulbactam (UNASYN) IV  3 g Intravenous Q6H  . busPIRone  15 mg Oral BID  . enoxaparin  40 mg Subcutaneous Q24H  . Flora-Q  1 capsule Oral Daily  . gemfibrozil  600 mg Oral BID AC  . lip balm  1 application Topical BID  . mesalamine  500 mg Oral BID  . potassium chloride  10 mEq Intravenous Q1 Hr x 6  . potassium chloride  40 mEq Oral Daily  . potassium chloride  40 mEq Oral Once  . propranolol  80 mg Oral BID  . psyllium  1 packet Oral BID    Family  History  Problem Relation Age of Onset  . Crohn's disease Brother   . Breast cancer Mother   . Colon cancer Neg Hx   . Heart attack Father 17  . Dementia Sister   . Diabetes Sister   . Prostate cancer Neg Hx      History   Social History  . Marital Status: Married, lives w/ wife     Number of Children: 4   Occupational History  . Retired    Social History Main Topics  . Smoking status: Former Smoker -- 1.0 packs/day for 40 years    Types: Cigarettes, Pipe    Quit date: 03/24/1990  . Smokeless tobacco: Never Used  . Alcohol Use: No     1 beer every 2-3 weeks   . Drug Use: No  . Sexually Active: Not Currently    Birth Control/ Protection: None    Review of Systems: General: Chills; negative for fever, night sweats or weight changes.  Cardiovascular: mild, chronic DOE; negative for chest pain, edema, orthopnea, palpitations, paroxysmal nocturnal dyspnea  Dermatological: negative for rash Respiratory: negative for cough or wheezing Urologic: negative for hematuria Abdominal: nausea, vomiting, abdominal pain; negative for diarrhea, bright red blood per rectum, melena, or hematemesis Neurologic: negative for visual changes, syncope, or dizziness All other systems reviewed and are otherwise negative except as noted above.  Physical Exam: Temp:  [96.8 F (36 C)-98.9 F (37.2 C)] 96.8 F (36 C) (11/26 1430) Pulse Rate:  [48-73] 60  (11/26 1500) Resp:  [12-20] 13  (11/26 1500) BP: (101-154)/(58-73) 153/73 mmHg (11/26 1500) SpO2:  [95 %-100 %] 100 % (11/26 1500)    General: Elderly white male, in no acute distress. Head: Normocephalic, atraumatic, sclera non-icteric, no xanthomas, nares are without discharge.  Neck: Supple. Negative for carotid bruits. No JVD. Lungs: Clear bilaterally to auscultation without wheezes, rales, or rhonchi. Breathing is unlabored. Heart: Irregular rhythm, regular rate with S1 S2. No murmurs, rubs, or gallops appreciated. Abdomen: Soft, tender  around incision site, non-distended with hypoactive bowel sounds. No rebound/guarding. No obvious abdominal masses. Msk:  Strength and tone appears normal for age. Extremities: No clubbing, cyanosis or edema.  Distal pedal pulses are 2+ and equal bilaterally. Neuro: Alert and oriented X 3. Moves all extremities spontaneously. Psych:  Responds to questions appropriately with a normal affect.  Labs:  Lab Results  Component Value Date   WBC 5.5 02/17/2011   HGB 13.8 02/17/2011   HCT 41.3 02/17/2011   MCV 93.0 02/17/2011   PLT 151 02/17/2011     Lab 02/17/11 0356  NA 136  K 3.0*  CL 101  CO2 25  BUN 11  CREATININE 0.96  CALCIUM 9.2  PROT 7.3  BILITOT 0.3  ALKPHOS 79  ALT 35  AST 55*  GLUCOSE 109*    Lab Results  Component Value Date   CHOL 128 02/15/2011    HDL 32* 02/15/2011    LDLCALC 76 02/15/2011    TRIG 100 02/15/2011     Radiology/Studies:  Dg Chest 2 View 02/14/2011  Findings: Enlargement of cardiac silhouette. Tortuous aorta. Mild pulmonary vascular congestion. Emphysematous changes with calcified granuloma left upper lobe. No definite pulmonary infiltrate or pleural effusion. Chronic accentuation of left basilar interstitial markings stable. No pneumothorax. Bones unremarkable.  IMPRESSION: Cardiomegaly with minimal chronic pulmonary vascular congestion. Emphysematous changes with left basilar fibrosis. No acute abnormalities.   CT ABDOMEN AND PELVIS WITH CONTRAST   02/14/2011  IMPRESSION:  1.  Suspect mild acute appendicitis, with mild apparent gallbladder Alcus Bradly thickening and inflammation; underlying cholelithiasis again noted.  No evidence to suggest obstruction. 2.  Bilateral renal cysts, measuring up to 3.0 cm in size. 3.  Borderline prominence of the prostate. 4.  Mild diffuse fatty infiltration within the liver. 5.  Mild chronic fibrotic change noted at both lung bases. 6.  Multiple small confluent nodules noted within the posterior right middle lung lobe,  along the right major fissure, measuring 1.7 cm.  Though this could be infectious in nature, malignancy cannot be excluded.  Suggest correlation for signs of lung infection; PET/CT would be helpful for further evaluation if no definite pneumonia is present, when and as deemed clinically appropriate.   Ct Abdomen Pelvis W Contrast (Addendum) 1.  Suspect mild acute cholecystitis, with mild apparent gallbladder Phyillis Dascoli thickening and inflammation; underlying cholelithiasis again noted.  No evidence to suggest obstruction.    EKG: 02/17/11 1353 - Atrial fibrillation, 68bpm, no ST/T wave changes  ASSESSMENT AND PLAN:  82yom w/ PMHx significant for HTN and hypertriglyceridemia who presented to [facility] with abdominal pain and is now s/p laparoscopic cholecystectomy with post-op atrial fibrillation. He has no history of irregular rhythms in the past. This episode is likely related to his acute illness, age, history of hypertension, and his hypokalemia. 1. Atrial fibrillation with controlled ventricular response: He is currently stable with HR 70s-90s. K+ is being supplemented and Mg & TSH levels are pending. Give him his 80mg  propranolol when able to take POs. His CHADS2 score is 2 for Age and HTN. If he stays in a.fib for >48hrs, consider IV heparin if cleared by surgery. If persistent afib would be a candidate for long term anticoagulation. Will obtain 2D echocardiogram to further risk stratify. 2. Hypokalemia: Cont to supplement and recheck levels. 3. Hypertension:  BP elevated w/ SBP 170s-180s, however, patient is having post op pain. BP was controlled in the 100s-130s preop. Cont home Norvasc.  4. Hypertriglyceridemia: Triglycerides 100. Cont home gemfibrozil  I have taken a history, reviewed medications, allergies, PMH, SH, FH, and reviewed ROS and examined the patient.  I agree with the assessment and plan and additions made. Discussed with patient.  Demonte Dobratz C. Daleen Squibb, MD, [facility] Pager:   [phone]   Signed, Berton Mount, PA-C 02/17/2011, 3:31 PM

## 2011-02-18 DIAGNOSIS — I059 Rheumatic mitral valve disease, unspecified: Secondary | ICD-10-CM

## 2011-02-18 LAB — HEPATIC FUNCTION PANEL
AST: 47 U/L — ABNORMAL HIGH (ref 0–37)
Bilirubin, Direct: 0.1 mg/dL (ref 0.0–0.3)
Indirect Bilirubin: 0.2 mg/dL — ABNORMAL LOW (ref 0.3–0.9)
Total Bilirubin: 0.3 mg/dL (ref 0.3–1.2)

## 2011-02-18 LAB — BASIC METABOLIC PANEL
BUN: 8 mg/dL (ref 6–23)
Calcium: 8.5 mg/dL (ref 8.4–10.5)
Creatinine, Ser: 0.92 mg/dL (ref 0.50–1.35)
GFR calc non Af Amer: 78 mL/min — ABNORMAL LOW (ref >90)
Glucose, Bld: 109 mg/dL — ABNORMAL HIGH (ref 70–99)
Potassium: 3.3 mEq/L — ABNORMAL LOW (ref 3.5–5.1)

## 2011-02-18 LAB — CBC
HCT: 36.4 % — ABNORMAL LOW (ref 39.0–52.0)
Hemoglobin: 12.2 g/dL — ABNORMAL LOW (ref 13.0–17.0)
MCH: 30.7 pg (ref 26.0–34.0)
MCHC: 33.5 g/dL (ref 30.0–36.0)
MCV: 91.7 fL (ref 78.0–100.0)

## 2011-02-18 LAB — MAGNESIUM: Magnesium: 1.6 mg/dL (ref 1.5–2.5)

## 2011-02-18 MED ORDER — POTASSIUM CHLORIDE CRYS ER 20 MEQ PO TBCR
40.0000 meq | EXTENDED_RELEASE_TABLET | Freq: Once | ORAL | Status: AC
  Administered 2011-02-18: 40 meq via ORAL
  Filled 2011-02-18: qty 2

## 2011-02-18 MED ORDER — WARFARIN SODIUM 7.5 MG PO TABS
7.5000 mg | ORAL_TABLET | Freq: Once | ORAL | Status: AC
  Administered 2011-02-18: 7.5 mg via ORAL
  Filled 2011-02-18: qty 1

## 2011-02-18 MED ORDER — ACETAMINOPHEN 325 MG PO TABS
325.0000 mg | ORAL_TABLET | ORAL | Status: DC | PRN
  Administered 2011-02-18 – 2011-02-20 (×7): 650 mg via ORAL
  Filled 2011-02-18 (×6): qty 2

## 2011-02-18 MED ORDER — PATIENT'S GUIDE TO USING COUMADIN BOOK
Freq: Once | Status: AC
  Administered 2011-02-18: 11:00:00
  Filled 2011-02-18: qty 1

## 2011-02-18 MED ORDER — WARFARIN VIDEO
Freq: Once | Status: AC
  Administered 2011-02-18: 11:00:00
  Filled 2011-02-18: qty 1

## 2011-02-18 NOTE — Progress Notes (Signed)
ANTICOAGULATION CONSULT NOTE - Initial Consult  Pharmacy Consult for Coumadin Indication: Post-op afib  No Known Allergies  Patient Measurements: Height: 6' (182.9 cm) Weight: 178 lb 2.1 oz (80.8 kg) IBW/kg (Calculated) : 77.6  Coumadin score = 5 CHADs2 = 2  Vital Signs: Temp: 97.6 F (36.4 C) (11/27 0541) Temp src: Oral (11/27 0541) BP: 117/69 mmHg (11/27 0541) Pulse Rate: 68  (11/27 0541)  Labs:  Basename 02/18/11 0457 02/17/11 0356 02/16/11 0447  HGB 12.2* 13.8 --  HCT 36.4* 41.3 38.8*  PLT 139* 151 130*  APTT -- -- --  LABPROT -- -- --  INR -- -- --  HEPARINUNFRC -- -- --  CREATININE 0.92 0.96 0.97  CKTOTAL -- -- --  CKMB -- -- --  TROPONINI -- -- --   Estimated Creatinine Clearance: 70.3 ml/min (by C-G formula based on Cr of 0.92).  Medical History: Past Medical History  Diagnosis Date  . Muscle tremor     saw neurology remotely elsewhere, was Rx inderal  . Crohn's ileocolitis   . Osteopenia     per DEXA 11/2008  . Hypertension 11/11    mild  . Hemorrhoids   . Hypertrophy of prostate     w/o UR obst & oth luts  . Peripheral neuropathy     h/o neg w/u  . Osteoarthritis     ankle,right  . ED (erectile dysfunction)   . Tremor   . Hypertriglyceridemia   . B12 deficiency   . Gallstones   . COPD (chronic obstructive pulmonary disease)     recent dx--no acute problems  . Shortness of breath     at times with w/excertion  . Cancer     squamous cell skin cancers-tx'd q69yrs  . Hiatal hernia 1956    found while in service  . Anxiety     Medications:  Scheduled:    . amLODipine  2.5 mg Oral Daily  . ampicillin-sulbactam (UNASYN) IV  3 g Intravenous Q6H  . busPIRone  15 mg Oral BID  . enoxaparin  40 mg Subcutaneous Q24H  . Flora-Q  1 capsule Oral Daily  . gemfibrozil  600 mg Oral BID AC  . lip balm  1 application Topical BID  . mesalamine  500 mg Oral BID  . potassium chloride  10 mEq Intravenous Q1 Hr x 6  . potassium chloride  10 mEq  Intravenous Q1 Hr x 2  . potassium chloride  40 mEq Oral Daily  . potassium chloride  40 mEq Oral Once  . propranolol  80 mg Oral BID  . psyllium  1 packet Oral BID  . DISCONTD: ampicillin-sulbactam (UNASYN) IV  3 g Intravenous Q6H    Assessment:  82 YOM with gallstone pancreatitis s/p laparoscopic cholecystectomy 11/26  Pt developed post-op atrial fibrillation with RVR.  Remains in afib.  Cardiology consulted and recommended to start coumadin.  Currently rate controlled.    CHADs2 = 2   Coumadin score = 5  No baseline INR needed given patient is POD #1 with no complication.  Goal of Therapy:  INR 2-3   Plan:   Coumadin 7.5 mg po x 1 tonight  Coumadin book/video  Daily PT/INR  Will provide coumadin education prior to discharge.  Geoffry Paradise Thi 02/18/2011,10:55 AM

## 2011-02-18 NOTE — Progress Notes (Signed)
Seen and agree, doing well post op  Mariella Saa MD, FACS  02/18/2011, 5:27 PM

## 2011-02-18 NOTE — Progress Notes (Signed)
Patient seen and examined. Agree with assessment and plan as per Toya Smothers NP

## 2011-02-18 NOTE — Progress Notes (Signed)
Subjective: "Im feeling ok today"   Objective: Vital signs Filed Vitals:   02/17/11 1629 02/17/11 1630 02/17/11 2159 02/18/11 0541  BP: 171/91 171/91 117/68 117/69  Pulse: 78 78 95 68  Temp: 97.4 F (36.3 C) 97.4 F (36.3 C) 99.6 F (37.6 C) 97.6 F (36.4 C)  TempSrc: Oral  Oral Oral  Resp: 16 23 21 20   Height: 6' (1.829 m)     Weight: 80.8 kg (178 lb 2.1 oz)     SpO2: 98% 98% 96% 96%   Weight change:  Last BM Date: 02/17/11  Intake/Output from previous day: 11/26 0701 - 11/27 0700 In: 3000 [I.V.:2800; IV Piggyback:200] Out: 1295 [Urine:1225; Drains:55; Blood:15]     Physical Exam: General: Alert, awake, oriented x3, in no acute distress. HEENT: No bruits, no goiter. Mucus membranes moist/pink. PERRL EOMI Heart: Irregular rate and rhythm, without murmurs, rubs, gallops. Lungs:Normal effort. Clear to auscultation bilaterally.No wheeze, rhonchi Abdomen: Soft, nondistended, positive bowel sounds. Mild tenderness to palpation. Extremities: No clubbing cyanosis or edema with positive pedal pulses. Neuro: Grossly intact, nonfocal.Speech clear. Facial symmetry    Lab Results: Basic Metabolic Panel:  Basename 02/18/11 0457 02/17/11 1545 02/17/11 0356  NA 134* -- 136  K 3.3* -- 3.0*  CL 100 -- 101  CO2 25 -- 25  GLUCOSE 109* -- 109*  BUN 8 -- 11  CREATININE 0.92 -- 0.96  CALCIUM 8.5 -- 9.2  MG -- 1.8 1.9  PHOS -- -- --   Liver Function Tests:  [facility] 02/18/11 0457 02/17/11 0356  AST 47* 55*  ALT 38 35  ALKPHOS 68 79  BILITOT 0.3 0.3  PROT 6.3 7.3  ALBUMIN 2.6* 3.0*    Basename 02/18/11 0457 02/17/11 0356 02/16/11 0447  LIPASE 93* 451* --  AMYLASE -- -- 147*   No results found for this basename: AMMONIA:2 in the last 72 hours CBC:  Basename 02/18/11 0457 02/17/11 0356  WBC 7.8 5.5  NEUTROABS -- --  HGB 12.2* 13.8  HCT 36.4* 41.3  MCV 91.7 93.0  PLT 139* 151   Cardiac Enzymes: No results found for this basename:  CKTOTAL:3,CKMB:3,CKMBINDEX:3,TROPONINI:3 in the last 72 hours BNP: No results found for this basename: POCBNP:3 in the last 72 hours D-Dimer: No results found for this basename: DDIMER:2 in the last 72 hours CBG: No results found for this basename: GLUCAP:6 in the last 72 hours Hemoglobin A1C: No results found for this basename: HGBA1C in the last 72 hours Fasting Lipid Panel:  Basename 02/15/11 1304  CHOL 128  HDL 32*  LDLCALC 76  TRIG 161  CHOLHDL 4.0  LDLDIRECT --   Thyroid Function Tests:  Basename 02/17/11 1545  TSH 4.159  T4TOTAL --  FREET4 --  T3FREE --  THYROIDAB --   Anemia Panel: No results found for this basename: VITAMINB12,FOLATE,FERRITIN,TIBC,IRON,RETICCTPCT in the last 72 hours Coagulation: No results found for this basename: LABPROT:2,INR:2 in the last 72 hours Urine Drug Screen: Drugs of Abuse  No results found for this basename: labopia, cocainscrnur, labbenz, amphetmu, thcu, labbarb    Alcohol Level: No results found for this basename: ETH:2 in the last 72 hours Urinalysis:  Misc. Labs:  No results found for this or any previous visit (from the past 240 hour(s)).  Studies/Results: Dg Cholangiogram Operative  02/17/2011  *RADIOLOGY REPORT*  Clinical Data:   Cholelithiasis.  INTRAOPERATIVE CHOLANGIOGRAM  Technique:  Cholangiographic images from the C-arm fluoroscopic device were submitted for interpretation post-operatively.  Please see the procedural report for the amount of  contrast and the fluoroscopy time utilized.  Comparison:  CT 02/14/2011  Findings:  Contrast fills an amorphous collection and does not fill the cystic duct or biliary system.  There are cholecystectomy clips present.  IMPRESSION: Intraoperative cholangiogram was not performed.   Contrast is outside of the biliary system.  Original Report Authenticated By: Richarda Overlie, M.D.    Medications: Scheduled Meds:   . amLODipine  2.5 mg Oral Daily  . ampicillin-sulbactam (UNASYN) IV  3  g Intravenous Q6H  . busPIRone  15 mg Oral BID  . enoxaparin  40 mg Subcutaneous Q24H  . Flora-Q  1 capsule Oral Daily  . gemfibrozil  600 mg Oral BID AC  . lip balm  1 application Topical BID  . mesalamine  500 mg Oral BID  . potassium chloride  10 mEq Intravenous Q1 Hr x 6  . potassium chloride  10 mEq Intravenous Q1 Hr x 2  . potassium chloride  40 mEq Oral Daily  . propranolol  80 mg Oral BID  . psyllium  1 packet Oral BID  . DISCONTD: ampicillin-sulbactam (UNASYN) IV  3 g Intravenous Q6H   Continuous Infusions:   . sodium chloride 75 mL/hr at 02/17/11 2348  . DISCONTD: lactated ringers    . DISCONTD: lactated ringers 1,000 mL (02/17/11 1138)  . DISCONTD: lactated ringers     PRN Meds:.acetaminophen, bisacodyl, diphenhydrAMINE, diphenhydrAMINE, guaiFENesin-dextromethorphan, hydrocortisone-pramoxine, magic mouthwash, morphine, ondansetron (ZOFRAN) IV, ondansetron, ondansetron, promethazine, witch hazel-glycerin, zolpidem, DISCONTD: acetaminophen, DISCONTD: bupivacaine-EPINEPHrine, DISCONTD: dexamethasone, DISCONTD: fentaNYL, DISCONTD: iohexol, DISCONTD: lactated ringers, DISCONTD: meperidine  Assessment/Plan:  Principal Problem: 1. *Gallstone pancreatitis: s/p cholecystectomy. Tolerating liquids. Pain controlled. Lipase trending downward. Surg following Active Problems: 2. New onset Afib/rate controlled. Appreciate cardiology assistance. Continue propanolol. Will start coumadin when ok with surgery. F/u with cards OP 3. COPD: stable at baseline.  4. Lung nodules. Follow up with Dr. Sherene Sires as OP 5. HTN: only fair control at present. Was controlled preoperatively. Suspect pain influencing. Provide pain med and continue home antihypertensives. Monitor 6. Hypokalemia: will replete and recheck.    LOS: 4 days   [facility] M 02/18/2011, 9:17 AM

## 2011-02-18 NOTE — Progress Notes (Signed)
*  PRELIMINARY RESULTS* Echocardiogram 2D Echocardiogram has been performed.  Brian [name] [facility] 02/18/2011, 10:44 AM

## 2011-02-18 NOTE — Progress Notes (Signed)
Patient ID: CORTEZ FLIPPEN, male   DOB: 04/12/1928, 77 y.o.   MRN: [id number] 1 Day Post-Op  Subjective: Pt feels well. C/o minor pain but controlled with pain meds.  Tolerating clears and would like more to eat.  + flatus  Objective: Vital signs in last 24 hours: Temp:  [96.8 F (36 C)-99.6 F (37.6 C)] 97.6 F (36.4 C) (11/27 0541) Pulse Rate:  [48-95] 68  (11/27 0541) Resp:  [12-23] 20  (11/27 0541) BP: (117-185)/(58-106) 117/69 mmHg (11/27 0541) SpO2:  [95 %-100 %] 96 % (11/27 0541) Weight:  [178 lb 2.1 oz (80.8 kg)] 178 lb 2.1 oz (80.8 kg) (11/26 1629) Last BM Date: 02/17/11  Intake/Output from previous day: 11/26 0701 - 11/27 0700 In: 3000 [I.V.:2800; IV Piggyback:200] Out: 1295 [Urine:1225; Drains:55; Blood:15] Intake/Output this shift: Total I/O In: 480 [P.O.:480] Out: -   PE: Abd: soft, appropriately tender. +BS, ND, incisions c/d/i Ht: irreg, irreg Lungs: CTAB  Lab Results:   Basename 02/18/11 0457 02/17/11 0356  WBC 7.8 5.5  HGB 12.2* 13.8  HCT 36.4* 41.3  PLT 139* 151   BMET  Basename 02/18/11 0457 02/17/11 0356  NA 134* 136  K 3.3* 3.0*  CL 100 101  CO2 25 25  GLUCOSE 109* 109*  BUN 8 11  CREATININE 0.92 0.96  CALCIUM 8.5 9.2   LFTS: TB: 0.3 AST: 47 ALT: 38 ALP: 68 Lip: 93 PT/INR No results found for this basename: LABPROT:2,INR:2 in the last 72 hours   Studies/Results: Dg Cholangiogram Operative  02/17/2011  *RADIOLOGY REPORT*  Clinical Data:   Cholelithiasis.  INTRAOPERATIVE CHOLANGIOGRAM  Technique:  Cholangiographic images from the C-arm fluoroscopic device were submitted for interpretation post-operatively.  Please see the procedural report for the amount of contrast and the fluoroscopy time utilized.  Comparison:  CT 02/14/2011  Findings:  Contrast fills an amorphous collection and does not fill the cystic duct or biliary system.  There are cholecystectomy clips present.  IMPRESSION: Intraoperative cholangiogram was not performed.    Contrast is outside of the biliary system.  Original Report Authenticated By: Richarda Overlie, M.D.    Anti-infectives: Anti-infectives     Start     Dose/Rate Route Frequency Ordered Stop   02/17/11 1800   Ampicillin-Sulbactam (UNASYN) 3 g in sodium chloride 0.9 % 100 mL IVPB        3 g 100 mL/hr over 60 Minutes Intravenous 4 times per day 02/17/11 1708 02/18/11 1759   02/15/11 1300   Ampicillin-Sulbactam (UNASYN) 3 g in sodium chloride 0.9 % 100 mL IVPB  Status:  Discontinued        3 g 100 mL/hr over 60 Minutes Intravenous 4 times per day 02/15/11 1300 02/17/11 1708   02/15/11 0330   Ampicillin-Sulbactam (UNASYN) 3 g in sodium chloride 0.9 % 100 mL IVPB  Status:  Discontinued        3 g 100 mL/hr over 60 Minutes Intravenous Every 6 hours 02/15/11 0238 02/15/11 1300           Assessment/Plan  1. Biliary panc, s/p lap chole 2. New onset A. Fib  Plan: will adv to a regular diet Begin mobilization OK to start coumadin per primary or cards Doing well surgically.  LOS: 4 days    Caprisha Bridgett E 02/18/2011

## 2011-02-18 NOTE — Progress Notes (Signed)
@   Subjective:  Denies CP or dyspnea; abdominal pain improved.   Objective:  Filed Vitals:   02/17/11 1629 02/17/11 1630 02/17/11 2159 02/18/11 0541  BP: 171/91 171/91 117/68 117/69  Pulse: 78 78 95 68  Temp: 97.4 F (36.3 C) 97.4 F (36.3 C) 99.6 F (37.6 C) 97.6 F (36.4 C)  TempSrc: Oral  Oral Oral  Resp: 16 23 21 20   Height: 6' (1.829 m)     Weight: 178 lb 2.1 oz (80.8 kg)     SpO2: 98% 98% 96% 96%    Intake/Output from previous day:  Intake/Output Summary (Last 24 hours) at 02/18/11 0713 Last data filed at 02/18/11 2952  Gross per 24 hour  Intake   3000 ml  Output   1295 ml  Net   1705 ml    Physical Exam: Physical exam: Well-developed well-nourished in no acute distress.  Skin is warm and dry.  HEENT is normal.  Neck is supple.  Chest is clear to auscultation with normal expansion.  Cardiovascular exam is irregular Abdominal exam s/p surgery, mildly distended Extremities show no edema. neuro grossly intact    Lab Results: Basic Metabolic Panel:  Basename 02/18/11 0457 02/17/11 1545 02/17/11 0356  NA 134* -- 136  K 3.3* -- 3.0*  CL 100 -- 101  CO2 25 -- 25  GLUCOSE 109* -- 109*  BUN 8 -- 11  CREATININE 0.92 -- 0.96  CALCIUM 8.5 -- 9.2  MG -- 1.8 1.9  PHOS -- -- --   Liver Function Tests:  [facility] 02/18/11 0457 02/17/11 0356  AST 47* 55*  ALT 38 35  ALKPHOS 68 79  BILITOT 0.3 0.3  PROT 6.3 7.3  ALBUMIN 2.6* 3.0*    Basename 02/18/11 0457 02/17/11 0356 02/16/11 0447  LIPASE 93* 451* --  AMYLASE -- -- 147*   CBC:  Basename 02/18/11 0457 02/17/11 0356  WBC 7.8 5.5  NEUTROABS -- --  HGB 12.2* 13.8  HCT 36.4* 41.3  MCV 91.7 93.0  PLT 139* 151   Fasting Lipid Panel:  Basename 02/15/11 1304  CHOL 128  HDL 32*  LDLCALC 76  TRIG 841  CHOLHDL 4.0  LDLDIRECT --   Thyroid Function Tests:  Basename 02/17/11 1545  TSH 4.159  T4TOTAL --  T3FREE --  THYROIDAB --          Telemetry: afib  Echo:  pending   Assessment/Plan:  1) atrial fibrillation - duration unknown; await echo; TSH normal; continue propranolol for rate control; begin coumadin when OK with surgery. F/U with Dr Daleen Squibb 2-4 weeks after discharge for consideration of DCCV if atrial fibrillation persists. 2) Gall stone pancreatitis - s/p cholecystectomy; management per surgery. 3) Lung nodules - management per primary service.  Olga Millers 02/18/2011, 7:13 AM

## 2011-02-19 ENCOUNTER — Encounter (HOSPITAL_COMMUNITY): Payer: Self-pay | Admitting: General Surgery

## 2011-02-19 LAB — BASIC METABOLIC PANEL
CO2: 25 mEq/L (ref 19–32)
Calcium: 8.9 mg/dL (ref 8.4–10.5)
Creatinine, Ser: 0.9 mg/dL (ref 0.50–1.35)
Glucose, Bld: 101 mg/dL — ABNORMAL HIGH (ref 70–99)

## 2011-02-19 LAB — PROTIME-INR: Prothrombin Time: 16.3 seconds — ABNORMAL HIGH (ref 11.6–15.2)

## 2011-02-19 MED ORDER — WARFARIN SODIUM 7.5 MG PO TABS
7.5000 mg | ORAL_TABLET | Freq: Once | ORAL | Status: AC
  Administered 2011-02-19: 7.5 mg via ORAL
  Filled 2011-02-19: qty 1

## 2011-02-19 NOTE — Progress Notes (Signed)
Pt examined, agree Mariella Saa MD, FACS  02/19/2011, 7:27 PM

## 2011-02-19 NOTE — Progress Notes (Signed)
Patient ID: Brian [name], male   DOB: [birth year], 77 y.o.   MRN: [id number] 2 Days Post-Op  Subjective: Pt feels well. Some pain at epigastric incision  Objective: Vital signs in last 24 hours: Temp:  [97.5 F (36.4 C)-97.8 F (36.6 C)] 97.8 F (36.6 C) (11/28 0300) Pulse Rate:  [64-77] 71  (11/28 0300) Resp:  [16-20] 20  (11/28 0300) BP: (104-156)/(64-81) 156/81 mmHg (11/28 0300) SpO2:  [94 %-98 %] 94 % (11/28 0300) Last BM Date: 02/19/11  Intake/Output from previous day: 11/27 0701 - 11/28 0700 In: 2220 [P.O.:1320; I.V.:900] Out: 1125 [Urine:1025; Drains:100] Intake/Output this shift: Total I/O In: 240 [P.O.:240] Out: 346 [Urine:325; Drains:20; Stool:1]  PE: Abd: soft, minimally tender, +BS, ND, incisions c/d/i.  Jp with serous output.  Lab Results:   [facility] 02/18/11 0457 02/17/11 0356  WBC 7.8 5.5  HGB 12.2* 13.8  HCT 36.4* 41.3  PLT 139* 151   BMET  Basename 02/19/11 0527 02/18/11 0457  NA 139 134*  K 3.6 3.3*  CL 105 100  CO2 25 25  GLUCOSE 101* 109*  BUN 9 8  CREATININE 0.90 0.92  CALCIUM 8.9 8.5   PT/INR  Basename 02/19/11 0640  LABPROT 16.3*  INR 1.29     Studies/Results: Dg Cholangiogram Operative  02/17/2011  *RADIOLOGY REPORT*  Clinical Data:   Cholelithiasis.  INTRAOPERATIVE CHOLANGIOGRAM  Technique:  Cholangiographic images from the C-arm fluoroscopic device were submitted for interpretation post-operatively.  Please see the procedural report for the amount of contrast and the fluoroscopy time utilized.  Comparison:  CT 02/14/2011  Findings:  Contrast fills an amorphous collection and does not fill the cystic duct or biliary system.  There are cholecystectomy clips present.  IMPRESSION: Intraoperative cholangiogram was not performed.   Contrast is outside of the biliary system.  Original Report Authenticated By: Richarda Overlie, M.D.    Anti-infectives: Anti-infectives     Start     Dose/Rate Route Frequency Ordered Stop   02/17/11 1800    Ampicillin-Sulbactam (UNASYN) 3 g in sodium chloride 0.9 % 100 mL IVPB        3 g 100 mL/hr over 60 Minutes Intravenous 4 times per day 02/17/11 1708 02/18/11 1759   02/15/11 1300   Ampicillin-Sulbactam (UNASYN) 3 g in sodium chloride 0.9 % 100 mL IVPB  Status:  Discontinued        3 g 100 mL/hr over 60 Minutes Intravenous 4 times per day 02/15/11 1300 02/17/11 1708   02/15/11 0330   Ampicillin-Sulbactam (UNASYN) 3 g in sodium chloride 0.9 % 100 mL IVPB  Status:  Discontinued        3 g 100 mL/hr over 60 Minutes Intravenous Every 6 hours 02/15/11 0238 02/15/11 1300           Assessment/Plan  1. S/p lap chole  Plan: Looks good surgically. Will d/c his jp tomorrow if conts to put out serous fluid.   LOS: 5 days    Natonya Finstad E 02/19/2011

## 2011-02-19 NOTE — Progress Notes (Signed)
ANTICOAGULATION CONSULT NOTE - Follow up  Pharmacy Consult for Coumadin Indication: Post-op afib  No Known Allergies  Patient Measurements: Height: 6' (182.9 cm) Weight: 178 lb 2.1 oz (80.8 kg) IBW/kg (Calculated) : 77.6  Coumadin score = 5 CHADs2 = 2  Vital Signs: Temp: 97.8 F (36.6 C) (11/28 0300) Temp src: Oral (11/28 0300) BP: 156/81 mmHg (11/28 0300) Pulse Rate: 71  (11/28 0300)  Labs:  Basename 02/19/11 0640 02/19/11 0527 02/18/11 0457 02/17/11 0356  HGB -- -- 12.2* 13.8  HCT -- -- 36.4* 41.3  PLT -- -- 139* 151  APTT -- -- -- --  LABPROT 16.3* -- -- --  INR 1.29 -- -- --  HEPARINUNFRC -- -- -- --  CREATININE -- 0.90 0.92 0.96  CKTOTAL -- -- -- --  CKMB -- -- -- --  TROPONINI -- -- -- --   Estimated Creatinine Clearance: 71.9 ml/min (by C-G formula based on Cr of 0.9).  Medical History: Past Medical History  Diagnosis Date  . Muscle tremor     saw neurology remotely elsewhere, was Rx inderal  . Crohn's ileocolitis   . Osteopenia     per DEXA 11/2008  . Hypertension 11/11    mild  . Hemorrhoids   . Hypertrophy of prostate     w/o UR obst & oth luts  . Peripheral neuropathy     h/o neg w/u  . Osteoarthritis     ankle,right  . ED (erectile dysfunction)   . Tremor   . Hypertriglyceridemia   . B12 deficiency   . Gallstones   . COPD (chronic obstructive pulmonary disease)     recent dx--no acute problems  . Shortness of breath     at times with w/excertion  . Cancer     squamous cell skin cancers-tx'd q91yrs  . Hiatal hernia 1956    found while in service  . Anxiety     Medications:  Scheduled:     . amLODipine  2.5 mg Oral Daily  . ampicillin-sulbactam (UNASYN) IV  3 g Intravenous Q6H  . busPIRone  15 mg Oral BID  . enoxaparin  40 mg Subcutaneous Q24H  . Flora-Q  1 capsule Oral Daily  . gemfibrozil  600 mg Oral BID AC  . lip balm  1 application Topical BID  . mesalamine  500 mg Oral BID  . patient's guide to using coumadin book   Does  not apply Once  . potassium chloride  40 mEq Oral Daily  . potassium chloride  40 mEq Oral Once  . propranolol  80 mg Oral BID  . psyllium  1 packet Oral BID  . warfarin  7.5 mg Oral ONCE-1800  . warfarin   Does not apply Once    Assessment:  31 YOM with gallstone pancreatitis s/p laparoscopic cholecystectomy 11/26  Pt developed post-op atrial fibrillation with RVR.  Remains in afib.  Coumadin started last night (11/27).  Currently rate controlled.    CHADs2 = 2.  Coumadin score = 5.  Cardiology considering DCCV outpatient if afib persists given atrial enlargement.    INR 1.29 this AM, No CBC, no bleeding/complications reported.    Goal of Therapy:  INR 2-3   Plan:   Discontinue sq lovenox ordered on admission  Repeat Coumadin 7.5 mg po x 1 tonight  Follow up daily PT/INR  Coumadin education will be provided today  Geoffry Paradise Thi 02/19/2011,9:25 AM

## 2011-02-19 NOTE — Progress Notes (Signed)
Subjective: "feeling much better"  Objective: Vital signs Filed Vitals:   02/18/11 1426 02/18/11 2227 02/19/11 0300 02/19/11 1434  BP: 104/64 133/79 156/81 132/73  Pulse: 64 77 71 67  Temp: 97.5 F (36.4 C) 97.7 F (36.5 C) 97.8 F (36.6 C) 97.6 F (36.4 C)  TempSrc: Oral Oral Oral Oral  Resp: 16 16 20 18   Height:      Weight:      SpO2: 98% 95% 94% 94%   Weight change:  Last BM Date: 02/19/11  Intake/Output from previous day: 11/27 0701 - 11/28 0700 In: 2220 [P.O.:1320; I.V.:900] Out: 1125 [Urine:1025; Drains:100] Total I/O In: 480 [P.O.:480] Out: 346 [Urine:325; Drains:20; Stool:1]   Physical Exam: General: Alert, awake, oriented x3, in no acute distress. Ambulates in room HEENT: No bruits, no goiter. PERRL EOMI Heart:Irregular rate and rhythm, without murmurs, rubs, gallops. Lungs: Normal effort. Clear to auscultation bilaterally. No wheeze, rhonchi Abdomen: Soft, nontender, nondistended, positive bowel sounds. Dsg/drain dry and intact Extremities: No clubbing cyanosis or edema with positive pedal pulses. Neuro: Grossly intact, nonfocal.Speech clear   Lab Results: Basic Metabolic Panel:  Basename 02/19/11 0527 02/18/11 0457 02/17/11 1545  NA 139 134* --  K 3.6 3.3* --  CL 105 100 --  CO2 25 25 --  GLUCOSE 101* 109* --  BUN 9 8 --  CREATININE 0.90 0.92 --  CALCIUM 8.9 8.5 --  MG -- 1.6 1.8  PHOS -- -- --   Liver Function Tests:  [facility] 02/18/11 0457 02/17/11 0356  AST 47* 55*  ALT 38 35  ALKPHOS 68 79  BILITOT 0.3 0.3  PROT 6.3 7.3  ALBUMIN 2.6* 3.0*    Basename 02/18/11 0457 02/17/11 0356  LIPASE 93* 451*  AMYLASE -- --   No results found for this basename: AMMONIA:2 in the last 72 hours CBC:  Basename 02/18/11 0457 02/17/11 0356  WBC 7.8 5.5  NEUTROABS -- --  HGB 12.2* 13.8  HCT 36.4* 41.3  MCV 91.7 93.0  PLT 139* 151   Cardiac Enzymes: No results found for this basename: CKTOTAL:3,CKMB:3,CKMBINDEX:3,TROPONINI:3 in the last 72  hours BNP: No results found for this basename: POCBNP:3 in the last 72 hours D-Dimer: No results found for this basename: DDIMER:2 in the last 72 hours CBG: No results found for this basename: GLUCAP:6 in the last 72 hours Hemoglobin A1C: No results found for this basename: HGBA1C in the last 72 hours Fasting Lipid Panel: No results found for this basename: CHOL,HDL,LDLCALC,TRIG,CHOLHDL,LDLDIRECT in the last 72 hours Thyroid Function Tests:  Basename 02/17/11 1545  TSH 4.159  T4TOTAL --  FREET4 --  T3FREE --  THYROIDAB --   Anemia Panel: No results found for this basename: VITAMINB12,FOLATE,FERRITIN,TIBC,IRON,RETICCTPCT in the last 72 hours Coagulation:  Basename 02/19/11 0640  LABPROT 16.3*  INR 1.29   Urine Drug Screen: Drugs of Abuse  No results found for this basename: labopia, cocainscrnur, labbenz, amphetmu, thcu, labbarb    Alcohol Level: No results found for this basename: ETH:2 in the last 72 hours Urinalysis:  Misc. Labs:  No results found for this or any previous visit (from the past 240 hour(s)).  Studies/Results: No results found.  Medications: Scheduled Meds:   . amLODipine  2.5 mg Oral Daily  . ampicillin-sulbactam (UNASYN) IV  3 g Intravenous Q6H  . busPIRone  15 mg Oral BID  . Flora-Q  1 capsule Oral Daily  . gemfibrozil  600 mg Oral BID AC  . lip balm  1 application Topical BID  .  mesalamine  500 mg Oral BID  . potassium chloride  40 mEq Oral Daily  . propranolol  80 mg Oral BID  . psyllium  1 packet Oral BID  . warfarin  7.5 mg Oral ONCE-1800  . warfarin  7.5 mg Oral ONCE-1800  . DISCONTD: enoxaparin  40 mg Subcutaneous Q24H   Continuous Infusions:   . sodium chloride 75 mL/hr at 02/19/11 0450   PRN Meds:.acetaminophen, bisacodyl, diphenhydrAMINE, diphenhydrAMINE, guaiFENesin-dextromethorphan, hydrocortisone-pramoxine, magic mouthwash, morphine, ondansetron (ZOFRAN) IV, ondansetron, ondansetron, promethazine, witch hazel-glycerin,  zolpidem  Assessment/Plan:  Principal Problem:  1*Gallstone pancreatitis:s/p cholecystectomy. Tolerating diet. Pain controlled Will transition antibiotic to po. Surg following Active Problems: 2. New onset afib/rate controled. Continue propanolol. Coumadin per pharmacy. INR 1.67. Will follow up with card at discharge 3. COPD: stable at baseline 4. Lung nodules: follow up with Dr. Sherene Sires OP 5. HTN: Better contol 6. Hypokalemia. repleted and resolved 7. Dispo: to home once INR therapeutic & surg oks. . . hopefully tomorrow   LOS: 5 days   [facility] M 02/19/2011, 3:09 PM

## 2011-02-19 NOTE — Progress Notes (Signed)
@   Subjective:  Denies CP or dyspnea; abdominal pain improving but persists.   Objective:  Filed Vitals:   02/18/11 0541 02/18/11 1426 02/18/11 2227 02/19/11 0300  BP: 117/69 104/64 133/79 156/81  Pulse: 68 64 77 71  Temp: 97.6 F (36.4 C) 97.5 F (36.4 C) 97.7 F (36.5 C) 97.8 F (36.6 C)  TempSrc: Oral Oral Oral Oral  Resp: 20 16 16 20   Height:      Weight:      SpO2: 96% 98% 95% 94%    Intake/Output from previous day:  Intake/Output Summary (Last 24 hours) at 02/19/11 0715 Last data filed at 02/19/11 1610  Gross per 24 hour  Intake   2220 ml  Output   1125 ml  Net   1095 ml    Physical Exam: Physical exam: Well-developed well-nourished in no acute distress.  Skin is warm and dry.  HEENT is normal.  Neck is supple.  Chest is clear to auscultation with normal expansion.  Cardiovascular exam is irregular Abdominal exam s/p surgery, mildly distended; mild tenderness to palpation Extremities show no edema. neuro grossly intact    Lab Results: Basic Metabolic Panel:  Basename 02/19/11 0527 02/18/11 0457 02/17/11 1545  NA 139 134* --  K 3.6 3.3* --  CL 105 100 --  CO2 25 25 --  GLUCOSE 101* 109* --  BUN 9 8 --  CREATININE 0.90 0.92 --  CALCIUM 8.9 8.5 --  MG -- 1.6 1.8  PHOS -- -- --   Liver Function Tests:  [facility] 02/18/11 0457 02/17/11 0356  AST 47* 55*  ALT 38 35  ALKPHOS 68 79  BILITOT 0.3 0.3  PROT 6.3 7.3  ALBUMIN 2.6* 3.0*    Basename 02/18/11 0457 02/17/11 0356  LIPASE 93* 451*  AMYLASE -- --   CBC:  Basename 02/18/11 0457 02/17/11 0356  WBC 7.8 5.5  NEUTROABS -- --  HGB 12.2* 13.8  HCT 36.4* 41.3  MCV 91.7 93.0  PLT 139* 151   Fasting Lipid Panel: No results found for this basename: CHOL,HDL,LDLCALC,TRIG,CHOLHDL,LDLDIRECT in the last 72 hours Thyroid Function Tests:  Basename 02/17/11 1545  TSH 4.159  T4TOTAL --  T3FREE --  THYROIDAB --          Telemetry: afib     Assessment/Plan:  1) atrial fibrillation -  duration unknown; Echo shows normal LV function, biatrial enlargement and mild MR; TSH normal; continue propranolol for rate control; coumadin initiated with goal INR 2-3. F/U with Dr Daleen Squibb 2-4 weeks after discharge for consideration of DCCV if atrial fibrillation persists; given atrial enlargement, patient may not hold sinus rhythm. Will need Fu in the coumadin clinic following DC. 2) Gall stone pancreatitis - s/p cholecystectomy; management per surgery. 3) Lung nodules - management per primary service. We will follow from a distance Olga Millers 02/19/2011, 7:15 AM

## 2011-02-19 NOTE — Progress Notes (Signed)
Addendum  Patient seen and examined, chart and data base reviewed.  I agree with the above assessment and plan  For full details please see [name], NP note.  Cholecystitis S/P cholecystectomy, New onset A fib, rate controlled, started on Coumadin, controlled HTN.  Does not have to stay in the hospital to reach therapeutic INR, can be followed as out patient basis.  Can go home when OK with general surgery service.  Luva Metzger A  02/19/2011, 6:33 PM

## 2011-02-20 ENCOUNTER — Encounter (HOSPITAL_COMMUNITY): Payer: Self-pay | Admitting: Internal Medicine

## 2011-02-20 LAB — BASIC METABOLIC PANEL
CO2: 27 mEq/L (ref 19–32)
Glucose, Bld: 119 mg/dL — ABNORMAL HIGH (ref 70–99)
Potassium: 3.6 mEq/L (ref 3.5–5.1)
Sodium: 140 mEq/L (ref 135–145)

## 2011-02-20 MED ORDER — FLORA-Q PO CAPS: 1.0000 | ORAL_CAPSULE | Freq: Every day | ORAL | Status: DC

## 2011-02-20 MED ORDER — GUAIFENESIN-DM 100-10 MG/5ML PO SYRP: 15.0000 mL | ORAL_SOLUTION | ORAL | Status: AC | PRN

## 2011-02-20 MED ORDER — PROPRANOLOL HCL 80 MG PO TABS: 80.0000 mg | ORAL_TABLET | Freq: Two times a day (BID) | ORAL | Status: DC

## 2011-02-20 MED ORDER — ACETAMINOPHEN 325 MG PO TABS: 325.0000 mg | ORAL_TABLET | ORAL | Status: AC | PRN

## 2011-02-20 NOTE — Discharge Summary (Signed)
Physician Discharge Summary  Patient ID: PACEY ALTIZER MRN: [id number] DOB/AGE: March 21, 1929 78 y.o.  Admit date: 02/14/2011 Discharge date: 02/20/2011  Primary Care Physician:  Willow Ora, MD, MD   Discharge Diagnoses:    Present on Admission:  .Gallstone pancreatitis .COPD (chronic obstructive pulmonary disease) .B12 DEFICIENCY .HYPERTRIGLYCERIDEMIA .Pancreatitis  Current Discharge Medication List    CONTINUE these medications which have NOT CHANGED   Details  amLODipine (NORVASC) 2.5 MG tablet TAKE 1 TABLET ONCE DAILY Qty: 90 tablet, Refills: 1    busPIRone (BUSPAR) 15 MG tablet TAKE 1 TABLET TWICE A DAY Qty: 180 tablet, Refills: 1    Calcium Carbonate-Vitamin D (CALTRATE 600+D PO) Take 1 tablet by mouth daily.     clobetasol (TEMOVATE) 0.05 % cream APPLY TO KNEES TWICE A DAY AS NEEDED Qty: 30 g, Refills: 0    Cyanocobalamin (NASCOBAL) 500 MCG/0.1ML SOLN      Flaxseed, Linseed, (FLAX SEED OIL) 1000 MG CAPS Take 1 capsule by mouth 2 (two) times daily.     gemfibrozil (LOPID) 600 MG tablet TAKE 1 TABLET TWICE A DAY Qty: 180 tablet, Refills: 0    hydrocortisone (ANUSOL-HC) 25 MG suppository Place 25 mg rectally 2 (two) times daily as needed. As directed if needed    Lidocaine-Hydrocortisone Ace 3-0.5 % CREA Apply topically. As needed    PENTASA 250 MG CR capsule TAKE 2 CAPSULES TWICE A DAY Qty: 360 capsule, Refills: 2    propranolol (INDERAL) 80 MG tablet TAKE 1 TABLET TWICE A DAY Qty: 180 tablet, Refills: 2    sildenafil (VIAGRA) 25 MG tablet Take 25 mg by mouth daily as needed.           Disposition and Follow-up: Pt. Medically stable and ready for discharge home. Apt with coumadin clinic 11/30 at noon. Dr. Daleen Squibb Dec 21 at 11am.   Consults:  Cardiology                    Surgery  Physical Exam:  General: Alert, awake, oriented x3, in no acute distress. Ambulates in room  HEENT: No bruits, no goiter. PERRL EOMI  Heart:Irregular rate and rhythm, without  murmurs, rubs, gallops.  Lungs: Normal effort. Clear to auscultation bilaterally. No wheeze, rhonchi  Abdomen: Soft, nontender, nondistended, positive bowel sounds. Drain site dry and clean.   Extremities: No clubbing cyanosis or edema with positive pedal pulses.  Neuro: Grossly intact, nonfocal.Speech clear        Significant Diagnostic Studies:  Dg Chest 2 View  02/14/2011  *RADIOLOGY REPORT*  Clinical Data: Pain, fever, chills, emesis, right upper quadrant pain, leukocytosis, history COPD, hypertension, Crohn's disease  CHEST - 2 VIEW  Comparison: 09/04/2010  Findings: Enlargement of cardiac silhouette. Tortuous aorta. Mild pulmonary vascular congestion. Emphysematous changes with calcified granuloma left upper lobe. No definite pulmonary infiltrate or pleural effusion. Chronic accentuation of left basilar interstitial markings stable. No pneumothorax. Bones unremarkable.  IMPRESSION: Cardiomegaly with minimal chronic pulmonary vascular congestion. Emphysematous changes with left basilar fibrosis. No acute abnormalities.  Original Report Authenticated By: Lollie Marrow, M.D.   Ct Abdomen Pelvis W Contrast  02/14/2011  **ADDENDUM** CREATED: 02/14/2011 23:33:36  The patient is status post appendectomy.  The first point in the impression should read:  1.  Suspect mild acute cholecystitis, with mild apparent gallbladder wall thickening and inflammation; underlying cholelithiasis again noted.  No evidence to suggest obstruction.  **END ADDENDUM** SIGNED BY: Tonia Ghent, M.D.   02/14/2011  *RADIOLOGY REPORT*  Clinical Data:  Right upper quadrant abdominal pain, chills and emesis.  Leukocytosis.  CT ABDOMEN AND PELVIS WITH CONTRAST  Technique:  Multidetector CT imaging of the abdomen and pelvis was performed following the standard protocol during bolus administration of intravenous contrast.  Contrast: OMNIPAQUE IOHEXOL 300 MG/ML IV SOLN  Comparison: CT of the abdomen and pelvis performed  03/01/2009, and abdominal radiograph performed 03/05/2009  Findings: Multiple small confluent nodules are noted within the posterior right middle lobe, along the right major fissure, measuring 1.7 cm in size.  Though this could be infectious in nature, malignancy cannot be excluded.  Mild chronic fibrotic change is seen at both lung bases.  There is mild diffuse fatty infiltration within the liver, with mild sparing about the gallbladder fossa.  The spleen is unremarkable in appearance.  There is mild apparent gallbladder wall thickening and mild associated inflammation; stones are again noted layering dependently within the gallbladder.  This could reflect mild cholecystitis.  There is no evidence of distension of the common hepatic duct or intrahepatic biliary ducts to suggest obstruction.  The pancreas and adrenal glands are unremarkable in appearance.  Nonspecific perinephric stranding is noted bilaterally, similar in appearance to the prior study.  Bilateral renal cysts are seen, measuring up to 3.0 cm in size.  There is no evidence of hydronephrosis.  No renal or ureteral stones are identified.  No free fluid is identified.  The small bowel is unremarkable in appearance.  The stomach is within normal limits.  No acute vascular abnormalities are seen.  Scattered calcification is noted along the distal abdominal aorta and its branches.  The patient is status post appendectomy, with scattered postoperative change noted at the right lower quadrant.  The colon is unremarkable in appearance.  The bladder is moderately distended and unremarkable in appearance. There is borderline prominence of the prostate, measuring 4.8 cm in transverse dimension.  No inguinal lymphadenopathy is seen.  No acute osseous abnormalities are identified.  IMPRESSION:  1.  Suspect mild acute appendicitis, with mild apparent gallbladder wall thickening and inflammation; underlying cholelithiasis again noted.  No evidence to suggest  obstruction. 2.  Bilateral renal cysts, measuring up to 3.0 cm in size. 3.  Borderline prominence of the prostate. 4.  Mild diffuse fatty infiltration within the liver. 5.  Mild chronic fibrotic change noted at both lung bases. 6.  Multiple small confluent nodules noted within the posterior right middle lung lobe, along the right major fissure, measuring 1.7 cm.  Though this could be infectious in nature, malignancy cannot be excluded.  Suggest correlation for signs of lung infection; PET/CT would be helpful for further evaluation if no definite pneumonia is present, when and as deemed clinically appropriate.  Original Report Authenticated By: Tonia Ghent, M.D.    Labs Reviewed  CBC - Abnormal; Notable for the following:    WBC 12.9 (*)    All other components within normal limits  DIFFERENTIAL - Abnormal; Notable for the following:    Neutrophils Relative 86 (*)    Neutro Abs 11.1 (*)    Lymphocytes Relative 6 (*)    All other components within normal limits  COMPREHENSIVE METABOLIC PANEL - Abnormal; Notable for the following:    Potassium 3.3 (*)    Glucose, Bld 106 (*)    AST 41 (*)    GFR calc non Af Amer 82 (*)    All other components within normal limits  LIPASE, BLOOD - Abnormal; Notable for the following:    Lipase 2519 (*)  All other components within normal limits  URINALYSIS, ROUTINE W REFLEX MICROSCOPIC - Abnormal; Notable for the following:    Hgb urine dipstick TRACE (*)    All other components within normal limits  LIPID PANEL - Abnormal; Notable for the following:    HDL 32 (*)    All other components within normal limits  LIPASE, BLOOD - Abnormal; Notable for the following:    Lipase 299 (*)    All other components within normal limits  AMYLASE - Abnormal; Notable for the following:    Amylase 147 (*)    All other components within normal limits  COMPREHENSIVE METABOLIC PANEL - Abnormal; Notable for the following:    Potassium 3.1 (*)    Glucose, Bld 116 (*)      Albumin 2.7 (*)    AST 41 (*)    GFR calc non Af Amer 76 (*)    GFR calc Af Amer 88 (*)    All other components within normal limits  CBC - Abnormal; Notable for the following:    RBC 4.17 (*)    HCT 38.8 (*)    Platelets 130 (*)    All other components within normal limits  BASIC METABOLIC PANEL - Abnormal; Notable for the following:    Potassium 3.0 (*)    Glucose, Bld 109 (*)    GFR calc non Af Amer 76 (*)    GFR calc Af Amer 88 (*)    All other components within normal limits  LIPASE, BLOOD - Abnormal; Notable for the following:    Lipase 451 (*)    All other components within normal limits  HEPATIC FUNCTION PANEL - Abnormal; Notable for the following:    Albumin 3.0 (*)    AST 55 (*)    Indirect Bilirubin 0.2 (*)    All other components within normal limits  CBC - Abnormal; Notable for the following:    RBC 3.97 (*)    Hemoglobin 12.2 (*)    HCT 36.4 (*)    Platelets 139 (*)    All other components within normal limits  BASIC METABOLIC PANEL - Abnormal; Notable for the following:    Sodium 134 (*)    Potassium 3.3 (*)    Glucose, Bld 109 (*)    GFR calc non Af Amer 78 (*)    GFR calc Af Amer 90 (*)    All other components within normal limits  HEPATIC FUNCTION PANEL - Abnormal; Notable for the following:    Albumin 2.6 (*)    AST 47 (*)    Indirect Bilirubin 0.2 (*)    All other components within normal limits  LIPASE, BLOOD - Abnormal; Notable for the following:    Lipase 93 (*)    All other components within normal limits  BASIC METABOLIC PANEL - Abnormal; Notable for the following:    Glucose, Bld 101 (*)    GFR calc non Af Amer 78 (*)    All other components within normal limits  PROTIME-INR - Abnormal; Notable for the following:    Prothrombin Time 16.3 (*)    All other components within normal limits  PROTIME-INR - Abnormal; Notable for the following:    Prothrombin Time 22.4 (*)    INR 1.93 (*)    All other components within normal limits  BASIC  METABOLIC PANEL - Abnormal; Notable for the following:    Glucose, Bld 119 (*)    GFR calc non Af Amer 83 (*)  All other components within normal limits  URINE MICROSCOPIC-ADD ON  CBC  MAGNESIUM  MAGNESIUM  TSH  MAGNESIUM  PATHOLOGY REPORT - SCANNED     Procedure(s): LAPAROSCOPIC CHOLECYSTECTOMY WITH INTRAOPERATIVE CHOLANGIOGRAM   Dg Chest 2 View  02/14/2011  *RADIOLOGY REPORT*  Clinical Data: Pain, fever, chills, emesis, right upper quadrant pain, leukocytosis, history COPD, hypertension, Crohn's disease  CHEST - 2 VIEW  Comparison: 09/04/2010  Findings: Enlargement of cardiac silhouette. Tortuous aorta. Mild pulmonary vascular congestion. Emphysematous changes with calcified granuloma left upper lobe. No definite pulmonary infiltrate or pleural effusion. Chronic accentuation of left basilar interstitial markings stable. No pneumothorax. Bones unremarkable.  IMPRESSION: Cardiomegaly with minimal chronic pulmonary vascular congestion. Emphysematous changes with left basilar fibrosis. No acute abnormalities.  Original Report Authenticated By: Lollie Marrow, M.D.   Dg Cholangiogram Operative  02/17/2011  *RADIOLOGY REPORT*  Clinical Data:   Cholelithiasis.  INTRAOPERATIVE CHOLANGIOGRAM  Technique:  Cholangiographic images from the C-arm fluoroscopic device were submitted for interpretation post-operatively.  Please see the procedural report for the amount of contrast and the fluoroscopy time utilized.  Comparison:  CT 02/14/2011  Findings:  Contrast fills an amorphous collection and does not fill the cystic duct or biliary system.  There are cholecystectomy clips present.  IMPRESSION: Intraoperative cholangiogram was not performed.   Contrast is outside of the biliary system.  Original Report Authenticated By: Richarda Overlie, M.D.   Ct Abdomen Pelvis W Contrast  02/14/2011  **ADDENDUM** CREATED: 02/14/2011 23:33:36  The patient is status post appendectomy.  The first point in the impression  should read:  1.  Suspect mild acute cholecystitis, with mild apparent gallbladder wall thickening and inflammation; underlying cholelithiasis again noted.  No evidence to suggest obstruction.  **END ADDENDUM** SIGNED BY: Tonia Ghent, M.D.   02/14/2011  *RADIOLOGY REPORT*  Clinical Data: Right upper quadrant abdominal pain, chills and emesis.  Leukocytosis.  CT ABDOMEN AND PELVIS WITH CONTRAST  Technique:  Multidetector CT imaging of the abdomen and pelvis was performed following the standard protocol during bolus administration of intravenous contrast.  Contrast: OMNIPAQUE IOHEXOL 300 MG/ML IV SOLN  Comparison: CT of the abdomen and pelvis performed 03/01/2009, and abdominal radiograph performed 03/05/2009  Findings: Multiple small confluent nodules are noted within the posterior right middle lobe, along the right major fissure, measuring 1.7 cm in size.  Though this could be infectious in nature, malignancy cannot be excluded.  Mild chronic fibrotic change is seen at both lung bases.  There is mild diffuse fatty infiltration within the liver, with mild sparing about the gallbladder fossa.  The spleen is unremarkable in appearance.  There is mild apparent gallbladder wall thickening and mild associated inflammation; stones are again noted layering dependently within the gallbladder.  This could reflect mild cholecystitis.  There is no evidence of distension of the common hepatic duct or intrahepatic biliary ducts to suggest obstruction.  The pancreas and adrenal glands are unremarkable in appearance.  Nonspecific perinephric stranding is noted bilaterally, similar in appearance to the prior study.  Bilateral renal cysts are seen, measuring up to 3.0 cm in size.  There is no evidence of hydronephrosis.  No renal or ureteral stones are identified.  No free fluid is identified.  The small bowel is unremarkable in appearance.  The stomach is within normal limits.  No acute vascular abnormalities are seen.   Scattered calcification is noted along the distal abdominal aorta and its branches.  The patient is status post appendectomy, with scattered postoperative change  noted at the right lower quadrant.  The colon is unremarkable in appearance.  The bladder is moderately distended and unremarkable in appearance. There is borderline prominence of the prostate, measuring 4.8 cm in transverse dimension.  No inguinal lymphadenopathy is seen.  No acute osseous abnormalities are identified.  IMPRESSION:  1.  Suspect mild acute appendicitis, with mild apparent gallbladder wall thickening and inflammation; underlying cholelithiasis again noted.  No evidence to suggest obstruction. 2.  Bilateral renal cysts, measuring up to 3.0 cm in size. 3.  Borderline prominence of the prostate. 4.  Mild diffuse fatty infiltration within the liver. 5.  Mild chronic fibrotic change noted at both lung bases. 6.  Multiple small confluent nodules noted within the posterior right middle lung lobe, along the right major fissure, measuring 1.7 cm.  Though this could be infectious in nature, malignancy cannot be excluded.  Suggest correlation for signs of lung infection; PET/CT would be helpful for further evaluation if no definite pneumonia is present, when and as deemed clinically appropriate.  Original Report Authenticated By: Tonia Ghent, M.D.       Brief H and P: For complete details please refer to admission H and P, but in brief HPI:  This is an extremely pleasant 28-year-old gentleman who presents with complaint of abdominal pain he stated it started Thursday after he lifted a heavy [place]. He initially was about back he related this to the fact that he lifted the [place]. On Thursday he hardly ate, he also developed some abdominal pain and cramping by the evening. The next day at approximately 2:30 he had green beans as sweet potato, he developed nausea vomiting, no evidence of blood. His abdominal pain he said was bearable, but he  knew he should not be there sig into the ear tonight. He describes the pain as sharp, occurs mainly with movement. 5/10 at its worse. He reports no fevers but lots of chills. He states he has some soft stool yesterday but that has since resolved. He does have a history of pancreatitis 9 years ago, at that point he was noted to have gallstones, it was opted not to do surgery. He has not had a recurrence since. History obtained from patient who is currently pain-free. Patient has been seen by surgery in the ER.    Hospital Course:  Active Hospital Problems  Diagnoses Date Noted   . Gallstone pancreatitis 02/15/2011   . Hypokalemia 02/17/2011   . Atrial fibrillation with controlled ventricular response 02/17/2011   . COPD (chronic obstructive pulmonary disease) 09/03/2010   . CROHN'S DISEASE-LARGE & SMALL INTESTINE 03/08/2010   . HYPERTENSION 02/04/2010   . GASTRITIS 04/04/2009   . B12 DEFICIENCY 08/27/2006   . HYPERTRIGLYCERIDEMIA 08/27/2006     Resolved Hospital Problems  Diagnoses Date Noted Date Resolved  . Pancreatitis 02/15/2011 02/16/2011    Principal Problem:  1*Gallstone pancreatitis:s/p cholecystectomy.  Admitted to tele. Had Lap/chole. Tolerated procedure. Diet advanced without difficulty. Pain managed with tylenol.  Completed necessary antibiotics. Surgery discontinued drain.  To see surgery 2-3 weeks OP Active Problems:  2. New onset afib/rate controled. Echo shows normal LV function, biatrial enlargement and mild MR. TSH normal. Seen by Corinda Gubler. Started on coumadin. Started on propranolol for rate control. At time of discharge INR 1.9. Pt will go to coumadin clinic 11/30 at 12noon for INR reading and coumadin dosing.  Continue propanolol.  3. COPD: stable at baseline  4. Lung nodules: CT abdomen of chest shows nodules. Will follow up with Dr.  [name] OP  5. HTN: Better contol  6. Hypokalemia. repleted and resolved     Time spent on Discharge: 30 min   Signed: Gwenyth Bender 02/20/2011, 12:54 PM

## 2011-02-20 NOTE — Progress Notes (Signed)
Patient ID: Brian [name], male   DOB: 10-Sep-1928, 77 y.o.   MRN: [id number] 3 Days Post-Op  Subjective: Pt feels well.  conts to eat well.  Objective: Vital signs in last 24 hours: Temp:  [97.4 F (36.3 C)-98.5 F (36.9 C)] 97.4 F (36.3 C) (11/29 0628) Pulse Rate:  [64-71] 64  (11/29 0628) Resp:  [18-20] 20  (11/29 0628) BP: (132-161)/(66-89) 155/66 mmHg (11/29 0628) SpO2:  [94 %-97 %] 95 % (11/29 0628) Last BM Date: 02/20/11  Intake/Output from previous day: 11/28 0701 - 11/29 0700 In: 1405 [P.O.:720; I.V.:685] Out: 736 [Urine:625; Drains:110; Stool:1] Intake/Output this shift: Total I/O In: 480 [P.O.:480] Out: 21 [Drains:20; Stool:1]  PE: Abd: soft, appropriately tender, +BS, incisions c/d/i.  JP with serous output only.  No evidence of bile.  Lab Results:   [facility] 02/18/11 0457  WBC 7.8  HGB 12.2*  HCT 36.4*  PLT 139*   BMET  Basename 02/20/11 0453 02/19/11 0527  NA 140 139  K 3.6 3.6  CL 103 105  CO2 27 25  GLUCOSE 119* 101*  BUN 7 9  CREATININE 0.79 0.90  CALCIUM 9.2 8.9   PT/INR  Basename 02/20/11 0453 02/19/11 0640  LABPROT 22.4* 16.3*  INR 1.93* 1.29     Studies/Results: No results found.  Anti-infectives: Anti-infectives     Start     Dose/Rate Route Frequency Ordered Stop   02/17/11 1800   Ampicillin-Sulbactam (UNASYN) 3 g in sodium chloride 0.9 % 100 mL IVPB        3 g 100 mL/hr over 60 Minutes Intravenous 4 times per day 02/17/11 1708 02/18/11 1759   02/15/11 1300   Ampicillin-Sulbactam (UNASYN) 3 g in sodium chloride 0.9 % 100 mL IVPB  Status:  Discontinued        3 g 100 mL/hr over 60 Minutes Intravenous 4 times per day 02/15/11 1300 02/17/11 1708   02/15/11 0330   Ampicillin-Sulbactam (UNASYN) 3 g in sodium chloride 0.9 % 100 mL IVPB  Status:  Discontinued        3 g 100 mL/hr over 60 Minutes Intravenous Every 6 hours 02/15/11 0238 02/15/11 1300           Assessment/Plan  1. S/p lap chole  Plan: Will d/c JP  drain Pt stable for d/c from our standpoint Will have him RTC in 2-3 weeks   LOS: 6 days    Regnia Mathwig E 02/20/2011

## 2011-02-20 NOTE — Progress Notes (Signed)
Agree with above  Mariella Saa MD, FACS  02/20/2011, 5:37 PM

## 2011-02-20 NOTE — Progress Notes (Addendum)
ANTICOAGULATION CONSULT NOTE - Follow up  Pharmacy Consult for Coumadin Indication: Post-op afib  No Known Allergies  Patient Measurements: Height: 6' (182.9 cm) Weight: 178 lb 2.1 oz (80.8 kg) IBW/kg (Calculated) : 77.6  Coumadin score = 5 CHADs2 = 2  Vital Signs: Temp: 97.4 F (36.3 C) (11/29 0628) Temp src: Oral (11/29 0628) BP: 155/66 mmHg (11/29 0628) Pulse Rate: 64  (11/29 0628)  Labs:  Brian [name] 02/20/11 0453 02/19/11 0640 02/19/11 0527 02/18/11 0457  HGB -- -- -- 12.2*  HCT -- -- -- 36.4*  PLT -- -- -- 139*  APTT -- -- -- --  LABPROT 22.4* 16.3* -- --  INR 1.93* 1.29 -- --  HEPARINUNFRC -- -- -- --  CREATININE 0.79 -- 0.90 0.92  CKTOTAL -- -- -- --  CKMB -- -- -- --  TROPONINI -- -- -- --   Estimated Creatinine Clearance: 80.8 ml/min (by C-G formula based on Cr of 0.79).  Medical History: Past Medical History  Diagnosis Date  . Muscle tremor     saw neurology remotely elsewhere, was Rx inderal  . Crohn's ileocolitis   . Osteopenia     per DEXA 11/2008  . Hypertension 11/11    mild  . Hemorrhoids   . Hypertrophy of prostate     w/o UR obst & oth luts  . Peripheral neuropathy     h/o neg w/u  . Osteoarthritis     ankle,right  . ED (erectile dysfunction)   . Tremor   . Hypertriglyceridemia   . B12 deficiency   . Gallstones   . COPD (chronic obstructive pulmonary disease)     recent dx--no acute problems  . Shortness of breath     at times with w/excertion  . Cancer     squamous cell skin cancers-tx'd q56yrs  . Hiatal hernia 1956    found while in service  . Anxiety     Medications:  Scheduled:     . amLODipine  2.5 mg Oral Daily  . busPIRone  15 mg Oral BID  . Flora-Q  1 capsule Oral Daily  . gemfibrozil  600 mg Oral BID AC  . lip balm  1 application Topical BID  . mesalamine  500 mg Oral BID  . potassium chloride  40 mEq Oral Daily  . propranolol  80 mg Oral BID  . psyllium  1 packet Oral BID  . warfarin  7.5 mg Oral ONCE-1800  .  DISCONTD: enoxaparin  40 mg Subcutaneous Q24H    Assessment:  82 YOM with gallstone pancreatitis s/p laparoscopic cholecystectomy 11/26  Pt developed post-op atrial fibrillation with RVR.  Remains in afib.  Coumadin started last night (11/27).  Currently rate controlled.    CHADs2 = 2.  Coumadin score = 5.  Cardiology considering DCCV outpatient if afib persists given atrial enlargement.    INR went from 1.29 to 1.93 this AM.  Pt is only s/p 2 doses of coumadin 7.5 mg   No CBC, no bleeding/complications reported.    Plan d/c home today if okay with surgery.    Goal of Therapy:  INR 2-3   Plan:   Will hold coumadin tonight given large increase in INR overnight and still in initiating phase.  If patient is discharged today, no coumadin tonight.  Would be best to obtain PT/INR tomorrow to see trend in INR and redose.  If unable to obtain PT/INR tomorrow, would recommend no Coumadin tonight.  Coumadin 5 mg po x 1 on  11/30 then PT/INR.   Geoffry Paradise Thi 02/20/2011,8:53 AM

## 2011-02-20 NOTE — Discharge Summary (Signed)
Patient seen and examined, chart and data base reviewed.  I agree with the above assessment and plan  For full details please see [name], NP note.  Biliary pancreatitis and Cholecystitis S/P cholecystectomy, New onset A fib, rate controlled, started on Coumadin, controlled HTN.  Patient should followup with his primary care physician, general surgery service, and Dr. Juanito [name].  Brian [name] A  02/20/2011, 1:56 PM

## 2011-02-21 ENCOUNTER — Encounter: Payer: Self-pay | Admitting: *Deleted

## 2011-02-21 ENCOUNTER — Ambulatory Visit (INDEPENDENT_AMBULATORY_CARE_PROVIDER_SITE_OTHER): Payer: Medicare Other | Admitting: *Deleted

## 2011-02-21 DIAGNOSIS — I4891 Unspecified atrial fibrillation: Secondary | ICD-10-CM

## 2011-02-21 DIAGNOSIS — Z7901 Long term (current) use of anticoagulants: Secondary | ICD-10-CM

## 2011-02-21 HISTORY — DX: Long term (current) use of anticoagulants: Z79.01

## 2011-02-21 LAB — POCT INR: INR: 2.6

## 2011-02-21 MED ORDER — WARFARIN SODIUM 2 MG PO TABS: 2.0000 mg | ORAL_TABLET | ORAL | Status: DC

## 2011-02-21 NOTE — Patient Instructions (Addendum)
A full discussion of the nature of anticoagulants has been carried out.  A benefit risk analysis has been presented to the patient, so that they understand the justification for choosing anticoagulation at this time. The need for frequent and regular monitoring, precise dosage adjustment and compliance is stressed.  Side effects of potential bleeding are discussed.  The patient should avoid any OTC items containing aspirin or ibuprofen, and should avoid great swings in general diet.  Avoid alcohol consumption.  Call if any signs of abnormal bleeding.  List of green vegetables with vitamin K in them provided to patient with explanation of consistency of diet.

## 2011-02-25 ENCOUNTER — Telehealth: Payer: Self-pay | Admitting: Internal Medicine

## 2011-02-25 NOTE — Telephone Encounter (Signed)
Pt was in hospitalized for gallbladder removal.  Ct abd/plevis done showed that pt has spots on lung that need to be evaluated.  Pt given appt with Dr Sherene Sires on 02-28-11.  Pt verbalized understanding

## 2011-02-28 ENCOUNTER — Ambulatory Visit (INDEPENDENT_AMBULATORY_CARE_PROVIDER_SITE_OTHER): Payer: Medicare Other | Admitting: Internal Medicine

## 2011-02-28 ENCOUNTER — Ambulatory Visit (INDEPENDENT_AMBULATORY_CARE_PROVIDER_SITE_OTHER): Payer: Medicare Other | Admitting: *Deleted

## 2011-02-28 ENCOUNTER — Encounter: Payer: Self-pay | Admitting: Internal Medicine

## 2011-02-28 VITALS — BP 126/74 | HR 66 | Temp 98.0°F | Ht 72.25 in | Wt 172.0 lb

## 2011-02-28 DIAGNOSIS — I4891 Unspecified atrial fibrillation: Secondary | ICD-10-CM

## 2011-02-28 DIAGNOSIS — R918 Other nonspecific abnormal finding of lung field: Secondary | ICD-10-CM

## 2011-02-28 DIAGNOSIS — R05 Cough: Secondary | ICD-10-CM

## 2011-02-28 DIAGNOSIS — R059 Cough, unspecified: Secondary | ICD-10-CM

## 2011-02-28 DIAGNOSIS — J449 Chronic obstructive pulmonary disease, unspecified: Secondary | ICD-10-CM

## 2011-02-28 DIAGNOSIS — Z7901 Long term (current) use of anticoagulants: Secondary | ICD-10-CM

## 2011-02-28 NOTE — Patient Instructions (Signed)
GERD (REFLUX)  is an extremely common cause of respiratory symptoms, many times with no significant heartburn at all.    It can be treated with medication, but also with lifestyle changes including avoidance of late meals, excessive alcohol, smoking cessation, and avoid fatty foods, chocolate, peppermint, colas, red wine, and acidic juices such as orange juice.  NO MINT OR MENTHOL PRODUCTS SO NO COUGH DROPS  USE SUGARLESS CANDY INSTEAD (jolley ranchers or Stover's)  NO OIL BASED VITAMINS - use powdered substitutes.   Please schedule a follow up visit in 3 months but call sooner if needed with cxr and pft's

## 2011-02-28 NOTE — Progress Notes (Signed)
  Subjective:    Patient ID: Brian [name], male    DOB: 22-Sep-1928, 77 y.o.   MRN: [id number]  HPI  81 yowm quit smoking 1990  with dx of copd by screening pft's but he was not aware of any limitations until around 2006 with exertion minimally worse since then referred 01/07/2011 by Dr Drue Novel for sob.  01/07/2011 Brian [name]/  Initial pulmonary office eval cc doe x exertion x 5 years, not progressing, not bothering sleep, no am cough or tendency to flares despite longterm maint on inderal for tremors.  Imp mild copd/ pf  rec  No f/u unless symptoms warrant    GB/ pancreatitis > lap chole 02/17/11 [facility] with abn ct abd  11/23 so referred back to pulmonary clinic   02/28/2011 f/u ov/Brian [name] cc still hoarse, denies limiting doe, cc throat drainage and freq throat clearing in retrospect present for years, no worse lately. No purulent sputum or overt HB, no h/o seasonal rhinitis or exac with colds or eating.    Sleeping ok without nocturnal  or early am exacerbation  of respiratory  C/o's   Also denies any obvious fluctuation of symptoms with weather or environmental changes or other aggravating or alleviating factors except as outlined above   ROS  At present neg for  any significant sore throat, dysphagia, itching, sneezing,  nasal congestion or excess/ purulent secretions,  fever, chills, sweats, unintended wt loss, pleuritic or exertional cp, hempoptysis, orthopnea pnd or leg swelling.  Also denies presyncope, palpitations, heartburn, abdominal pain, nausea, vomiting, diarrhea  or change in bowel or urinary habits, dysuria,hematuria,  rash, arthralgias, visual complaints, headache, numbness weakness or ataxia.           Objective:   Physical Exam 178  01/07/11  >   172   02/28/2011  Edentulous wm nad minimally hoarse HEENT mild turbinate edema.  Oropharynx no thrush or excess pnd or cobblestoning.  No JVD or cervical adenopathy. Mild accessory muscle hypertrophy. Trachea midline, nl thryroid.  Chest was hyperinflated by percussion with diminished breath sounds and moderate increased exp time without wheeze. Hoover sign positive at mid inspiration. Regular rate and rhythm without murmur gallop or rub or increase P2 or edema.  Abd: no hsm, nl excursion. Ext warm without cyanosis  clubbing.       cxr 02/14/11 Cardiomegaly with minimal chronic pulmonary vascular congestion.  Emphysematous changes with left basilar fibrosis.  No acute abnormalities.       Assessment & Plan:  \

## 2011-03-02 DIAGNOSIS — R059 Cough, unspecified: Secondary | ICD-10-CM | POA: Insufficient documentation

## 2011-03-02 DIAGNOSIS — R918 Other nonspecific abnormal finding of lung field: Secondary | ICD-10-CM | POA: Insufficient documentation

## 2011-03-02 DIAGNOSIS — R05 Cough: Secondary | ICD-10-CM | POA: Insufficient documentation

## 2011-03-02 NOTE — Assessment & Plan Note (Signed)
Although there are clearly abnormalities on CT scan, they should probably be considered "microscopic" since not obvious on plain cxr and since they are multiple don't lend themselves to "early intervention"  -   In the setting of obvious "macroscopic" health issues,  I am very reluctatnt to embark on an invasive w/u at this point but will arrange consevative  follow up and in the meantime see what we can do to address the patient's subjective concerns.    Discussed in detail all the  indications, usual  risks and alternatives  relative to the benefits with patient who agrees to proceed with conservative f/u to put multiple "points on the curve" and pt and wife are quite comfortable with this approach.

## 2011-03-02 NOTE — Assessment & Plan Note (Signed)
Most c/w Upper airway cough syndrome, so named because it's frequently impossible to sort out how much is  CR/sinusitis with freq throat clearing (which can be related to primary GERD)   vs  causing  secondary (" extra esophageal")  GERD from wide swings in gastric pressure that occur with throat clearing, often  promoting self use of mint and menthol lozenges that reduce the lower esophageal sphincter tone and exacerbate the problem further in a cyclical fashion.   These are the same pts who not infrequently have failed to tolerate ace inhibitors,  dry powder inhalers or biphosphonates or report having reflux symptoms that don't respond to standard doses of PPI , and are easily confused as having aecopd or asthma flares.  rec rx as GERD for now and regroup at next ov.

## 2011-03-02 NOTE — Assessment & Plan Note (Signed)
He only has very mild obstruction with ? Restrictive component, needs full pft's and serail f/u

## 2011-03-04 ENCOUNTER — Ambulatory Visit (INDEPENDENT_AMBULATORY_CARE_PROVIDER_SITE_OTHER): Payer: Medicare Other | Admitting: General Surgery

## 2011-03-04 ENCOUNTER — Encounter (INDEPENDENT_AMBULATORY_CARE_PROVIDER_SITE_OTHER): Payer: Self-pay | Admitting: General Surgery

## 2011-03-04 DIAGNOSIS — K8010 Calculus of gallbladder with chronic cholecystitis without obstruction: Secondary | ICD-10-CM

## 2011-03-04 DIAGNOSIS — K8000 Calculus of gallbladder with acute cholecystitis without obstruction: Secondary | ICD-10-CM

## 2011-03-04 NOTE — Progress Notes (Signed)
Brian [name] Feb 10, 1929 [id number] 03/04/2011   Brian [name] is a 76 y.o. male who had a laparoscopic cholecystectomy with intraoperative cholangiogram.  The pathology report confirmed Chronic and acute cholecystitis, cholelithiasis..  The patient reports that they are feeling well with normal bowel movements and good appetite.  The pre-operative symptoms of abdominal pain, nausea, and vomiting have resolved.    Physical examination - Incisions appear well-healed with no sign of infection or bleeding.   Abdomen - soft, non-tender  Impression:  s/p laparoscopic cholecystectomy  Plan:  He may resume a regular diet and full activity.  He may follow-up on a PRN basis.

## 2011-03-04 NOTE — Patient Instructions (Signed)
Call us if you have any problems.  Clean sites with soap and water.

## 2011-03-07 ENCOUNTER — Ambulatory Visit (INDEPENDENT_AMBULATORY_CARE_PROVIDER_SITE_OTHER): Payer: Medicare Other | Admitting: *Deleted

## 2011-03-07 DIAGNOSIS — I4891 Unspecified atrial fibrillation: Secondary | ICD-10-CM

## 2011-03-07 DIAGNOSIS — Z7901 Long term (current) use of anticoagulants: Secondary | ICD-10-CM

## 2011-03-14 ENCOUNTER — Ambulatory Visit (INDEPENDENT_AMBULATORY_CARE_PROVIDER_SITE_OTHER): Payer: Medicare Other | Admitting: *Deleted

## 2011-03-14 ENCOUNTER — Ambulatory Visit (INDEPENDENT_AMBULATORY_CARE_PROVIDER_SITE_OTHER): Payer: Medicare Other | Admitting: Cardiology

## 2011-03-14 ENCOUNTER — Encounter: Payer: Self-pay | Admitting: Cardiology

## 2011-03-14 ENCOUNTER — Other Ambulatory Visit: Payer: Self-pay | Admitting: *Deleted

## 2011-03-14 VITALS — BP 122/72 | HR 56 | Ht 72.25 in | Wt 171.0 lb

## 2011-03-14 DIAGNOSIS — I10 Essential (primary) hypertension: Secondary | ICD-10-CM

## 2011-03-14 DIAGNOSIS — I4891 Unspecified atrial fibrillation: Secondary | ICD-10-CM

## 2011-03-14 DIAGNOSIS — Z7901 Long term (current) use of anticoagulants: Secondary | ICD-10-CM

## 2011-03-14 LAB — POCT INR: INR: 2.4

## 2011-03-14 MED ORDER — WARFARIN SODIUM 2 MG PO TABS: 2.0000 mg | ORAL_TABLET | ORAL | Status: DC

## 2011-03-14 NOTE — Assessment & Plan Note (Signed)
Rate well controlled and asymptomatic. Continue anticoagulation with moderate risk of stroke. Reviewed with patient and wife. See [place] back in a year.

## 2011-03-14 NOTE — Progress Notes (Signed)
HPI [name] returns today for evaluation and management of his history of atrial fibrillation and anticoagulation.  He recently diagnosed with hypertension and has been placed on amlodipine.  He denies any symptoms of palpitations, chest pain, presyncope, syncope, or significant dyspnea on exertion. Other than a mild nosebleed, he's had no bleeding problems. His laboratory data is followed by primary care once a year he says.  Past Medical History  Diagnosis Date  . Muscle tremor     saw neurology remotely elsewhere, was Rx inderal  . Crohn's ileocolitis   . Osteopenia     per DEXA 11/2008  . Hypertension 11/11    mild  . Hemorrhoids   . Hypertrophy of prostate     w/o UR obst & oth luts  . Peripheral neuropathy     h/o neg w/u  . Osteoarthritis     ankle,right  . ED (erectile dysfunction)   . Tremor   . Hypertriglyceridemia   . B12 deficiency   . Gallstones   . COPD (chronic obstructive pulmonary disease)     recent dx--no acute problems  . Shortness of breath     at times with w/excertion  . Cancer     squamous cell skin cancers-tx'd q69yrs  . Hiatal hernia 1956    found while in service  . Anxiety   . Atrial fibrillation with RVR   . Encounter for long-term (current) use of anticoagulants 02/21/2011    Current Outpatient Prescriptions  Medication Sig Dispense Refill  . amLODipine (NORVASC) 2.5 MG tablet TAKE 1 TABLET ONCE DAILY  90 tablet  1  . busPIRone (BUSPAR) 15 MG tablet TAKE 1 TABLET TWICE A DAY  180 tablet  1  . Calcium Carbonate-Vitamin D (CALTRATE 600+D PO) Take 1 tablet by mouth daily.       . clobetasol (TEMOVATE) 0.05 % cream APPLY TO KNEES TWICE A DAY AS NEEDED  30 g  0  . Cyanocobalamin (NASCOBAL) 500 MCG/0.1ML SOLN 1 spray to nostril once weekly      . gemfibrozil (LOPID) 600 MG tablet TAKE 1 TABLET TWICE A DAY  180 tablet  0  . hydrocortisone (ANUSOL-HC) 25 MG suppository Place 25 mg rectally 2 (two) times daily as needed. As directed if needed       . Lidocaine-Hydrocortisone Ace 3-0.5 % CREA Apply topically. As needed      . PENTASA 250 MG CR capsule TAKE 2 CAPSULES TWICE A DAY  360 capsule  2  . propranolol (INDERAL) 80 MG tablet Take 1 tablet (80 mg total) by mouth 2 (two) times daily.  60 tablet  0  . psyllium (METAMUCIL) 58.6 % powder 1 tbs daily       . sildenafil (VIAGRA) 25 MG tablet Take 25 mg by mouth daily as needed.        . warfarin (COUMADIN) 2 MG tablet Take 1 tablet (2 mg total) by mouth as directed.  30 tablet  0    No Known Allergies  Family History  Problem Relation Age of Onset  . Crohn's disease Brother   . Breast cancer Mother   . Colon cancer Neg Hx   . Prostate cancer Neg Hx   . Heart attack Father 68  . Dementia Sister   . Diabetes Sister     History   Social History  . Marital Status: Married    Spouse Name: N/A    Number of Children: 4  . Years of Education: N/A   Occupational  History  . Retired    Social History Main Topics  . Smoking status: Former Smoker -- 1.0 packs/day for 40 years    Types: Cigarettes, Pipe    Quit date: 03/24/1990  . Smokeless tobacco: Never Used  . Alcohol Use: No     1 beer every 2-3 weeks   . Drug Use: No  . Sexually Active: Not Currently    Birth Control/ Protection: None   Other Topics Concern  . Not on file   Social History Narrative   Married, lives w/ wife     ROS ALL NEGATIVE EXCEPT THOSE NOTED IN HPI  PE  General Appearance: well developed, well nourished in no acute distress HEENT: symmetrical face, PERRLA, good dentition  Neck: no JVD, thyromegaly, or adenopathy, trachea midline Chest: symmetric without deformity Cardiac: PMI non-displaced, RRR, normal S1, S2, no gallop or murmur Lung: clear to ausculation and percussion Vascular: all pulses full without bruits  Abdominal: nondistended, nontender, good bowel sounds, no HSM, no bruits Extremities: no cyanosis, clubbing or edema, no sign of DVT, no varicosities  Skin: normal color, no  rashes Neuro: alert and oriented x 3, non-focal Pysch: normal affect  EKG Not repeated today. BMET    Component Value Date/Time   NA 140 02/20/2011 0453   K 3.6 02/20/2011 0453   CL 103 02/20/2011 0453   CO2 27 02/20/2011 0453   GLUCOSE 119* 02/20/2011 0453   BUN 7 02/20/2011 0453   CREATININE 0.79 02/20/2011 0453   CALCIUM 9.2 02/20/2011 0453   GFRNONAA 83* 02/20/2011 0453   GFRAA >90 02/20/2011 0453    Lipid Panel     Component Value Date/Time   CHOL 128 02/15/2011 1304   TRIG 100 02/15/2011 1304   HDL 32* 02/15/2011 1304   CHOLHDL 4.0 02/15/2011 1304   VLDL 20 02/15/2011 1304   LDLCALC 76 02/15/2011 1304    CBC    Component Value Date/Time   WBC 7.8 02/18/2011 0457   RBC 3.97* 02/18/2011 0457   HGB 12.2* 02/18/2011 0457   HCT 36.4* 02/18/2011 0457   PLT 139* 02/18/2011 0457   MCV 91.7 02/18/2011 0457   MCH 30.7 02/18/2011 0457   MCHC 33.5 02/18/2011 0457   RDW 12.7 02/18/2011 0457   LYMPHSABS 0.8 02/14/2011 2041   MONOABS 1.0 02/14/2011 2041   EOSABS 0.0 02/14/2011 2041   BASOSABS 0.0 02/14/2011 2041      with her

## 2011-03-14 NOTE — Patient Instructions (Signed)
Your physician recommends that you schedule a follow-up appointment in: 1 year  

## 2011-03-18 ENCOUNTER — Other Ambulatory Visit: Payer: Self-pay | Admitting: Internal Medicine

## 2011-03-28 ENCOUNTER — Ambulatory Visit (INDEPENDENT_AMBULATORY_CARE_PROVIDER_SITE_OTHER): Payer: Medicare Other | Admitting: *Deleted

## 2011-03-28 DIAGNOSIS — I4891 Unspecified atrial fibrillation: Secondary | ICD-10-CM | POA: Diagnosis not present

## 2011-03-28 DIAGNOSIS — Z7901 Long term (current) use of anticoagulants: Secondary | ICD-10-CM

## 2011-03-28 LAB — POCT INR: INR: 2.4

## 2011-03-30 ENCOUNTER — Other Ambulatory Visit: Payer: Self-pay | Admitting: Internal Medicine

## 2011-04-04 ENCOUNTER — Other Ambulatory Visit: Payer: Self-pay | Admitting: Internal Medicine

## 2011-04-18 ENCOUNTER — Ambulatory Visit (INDEPENDENT_AMBULATORY_CARE_PROVIDER_SITE_OTHER): Payer: Medicare Other | Admitting: *Deleted

## 2011-04-18 DIAGNOSIS — Z7901 Long term (current) use of anticoagulants: Secondary | ICD-10-CM | POA: Diagnosis not present

## 2011-04-18 DIAGNOSIS — I4891 Unspecified atrial fibrillation: Secondary | ICD-10-CM | POA: Diagnosis not present

## 2011-04-28 ENCOUNTER — Other Ambulatory Visit: Payer: Self-pay | Admitting: *Deleted

## 2011-04-28 MED ORDER — WARFARIN SODIUM 2 MG PO TABS: 2.0000 mg | ORAL_TABLET | ORAL | Status: DC

## 2011-05-16 ENCOUNTER — Ambulatory Visit (INDEPENDENT_AMBULATORY_CARE_PROVIDER_SITE_OTHER): Payer: Medicare Other | Admitting: Pharmacist

## 2011-05-16 DIAGNOSIS — I4891 Unspecified atrial fibrillation: Secondary | ICD-10-CM | POA: Diagnosis not present

## 2011-05-16 DIAGNOSIS — Z7901 Long term (current) use of anticoagulants: Secondary | ICD-10-CM

## 2011-05-16 LAB — POCT INR: INR: 2.2

## 2011-05-20 ENCOUNTER — Encounter: Payer: Self-pay | Admitting: Internal Medicine

## 2011-05-20 ENCOUNTER — Ambulatory Visit (INDEPENDENT_AMBULATORY_CARE_PROVIDER_SITE_OTHER): Payer: Medicare Other | Admitting: Internal Medicine

## 2011-05-20 VITALS — BP 170/88 | HR 60 | Temp 97.7°F | Wt 176.0 lb

## 2011-05-20 DIAGNOSIS — I10 Essential (primary) hypertension: Secondary | ICD-10-CM | POA: Diagnosis not present

## 2011-05-20 NOTE — Assessment & Plan Note (Signed)
Blood pressure was previously well controlled, in the last 2 weeks the systolic blood pressure was in the 170s, diastolic blood pressure remained normal. Problem is most likely due to salt indiscretion. Recommend a low-salt diet, continue taking his BPs and let me know if problems. See instructions.

## 2011-05-20 NOTE — Progress Notes (Signed)
  Subjective:    Patient ID: Brian [name], male    DOB: 10-Oct-1928, 78 y.o.   MRN: [id number]  HPI Acute visit, his blood pressure was previously well controlled at around 120/80, in the last 2 weeks he has been going up to the 170s/80s. Since the last time I saw him a few months ago, he was admitted to the hospital, had a laparoscopic cholecystectomy, was found to be an atrial fibrillation and started warfarin. He actually feels very well now.  Past Medical History  Diagnosis Date  . Muscle tremor     saw neurology remotely elsewhere, was Rx inderal  . Crohn's ileocolitis   . Osteopenia     per DEXA 11/2008  . Hypertension 11/11    mild  . Hemorrhoids   . Hypertrophy of prostate     w/o UR obst & oth luts  . Peripheral neuropathy     h/o neg w/u  . Osteoarthritis     ankle,right  . ED (erectile dysfunction)   . Tremor   . Hypertriglyceridemia   . B12 deficiency   . Gallstones   . COPD (chronic obstructive pulmonary disease)     recent dx--no acute problems  . Shortness of breath     at times with w/excertion  . Cancer     squamous cell skin cancers-tx'd q12yrs  . Hiatal hernia 1956    found while in service  . Anxiety   . Atrial fibrillation with RVR   . Encounter for long-term (current) use of anticoagulants 02/21/2011     Review of Systems No chest pain no shortness or breath No lower extremity edema On further questioning, he admits that he is eating more salt in the last 2 weeks, he found a special ham that he likes but he  stopped eating it a day ago.    Objective:   Physical Exam Alert, oriented x3, no apparent distress. Lungs, decreased breath sounds but otherwise clear. Cardiovascular bradycardic, seems regular today. Extremities no edema       Assessment & Plan:

## 2011-05-20 NOTE — Patient Instructions (Signed)
Check the  blood pressure daily , be sure it is less than 140/85. If it is consistently higher, let me know Avoid salt

## 2011-06-09 ENCOUNTER — Encounter: Payer: Self-pay | Admitting: Internal Medicine

## 2011-06-09 ENCOUNTER — Ambulatory Visit (INDEPENDENT_AMBULATORY_CARE_PROVIDER_SITE_OTHER)
Admission: RE | Admit: 2011-06-09 | Discharge: 2011-06-09 | Disposition: A | Discharge status: Home / Self Care | Payer: Medicare Other | Source: Ambulatory Visit | Attending: Internal Medicine | Admitting: Internal Medicine

## 2011-06-09 ENCOUNTER — Ambulatory Visit (INDEPENDENT_AMBULATORY_CARE_PROVIDER_SITE_OTHER): Payer: Medicare Other | Admitting: Internal Medicine

## 2011-06-09 VITALS — BP 142/82 | HR 52 | Temp 97.4°F | Ht 72.0 in | Wt 177.0 lb

## 2011-06-09 DIAGNOSIS — J4489 Other specified chronic obstructive pulmonary disease: Secondary | ICD-10-CM

## 2011-06-09 DIAGNOSIS — J449 Chronic obstructive pulmonary disease, unspecified: Secondary | ICD-10-CM

## 2011-06-09 DIAGNOSIS — J8410 Pulmonary fibrosis, unspecified: Secondary | ICD-10-CM | POA: Diagnosis not present

## 2011-06-09 DIAGNOSIS — R05 Cough: Secondary | ICD-10-CM | POA: Diagnosis not present

## 2011-06-09 DIAGNOSIS — J438 Other emphysema: Secondary | ICD-10-CM | POA: Diagnosis not present

## 2011-06-09 DIAGNOSIS — R059 Cough, unspecified: Secondary | ICD-10-CM

## 2011-06-09 DIAGNOSIS — I10 Essential (primary) hypertension: Secondary | ICD-10-CM | POA: Diagnosis not present

## 2011-06-09 LAB — PULMONARY FUNCTION TEST

## 2011-06-09 NOTE — Progress Notes (Signed)
PFT done today. 

## 2011-06-09 NOTE — Assessment & Plan Note (Signed)
The most common causes of chronic cough in immunocompetent adults include the following: upper airway cough syndrome (UACS), previously referred to as postnasal drip syndrome (PNDS), which is caused by variety of rhinosinus conditions; (2) asthma; (3) GERD; (4) chronic bronchitis from cigarette smoking or other inhaled environmental irritants; (5) nonasthmatic eosinophilic bronchitis; and (6) bronchiectasis.   These conditions, singly or in combination, have accounted for up to 94% of the causes of chronic cough in prospective studies.   Other conditions have constituted no >6% of the causes in prospective studies These have included bronchogenic carcinoma, chronic interstitial pneumonia, sarcoidosis, left ventricular failure, ACEI-induced cough, and aspiration from a condition associated with pharyngeal dysfunction.  .Chronic cough is often simultaneously caused by more than one condition. A single cause has been found from 38 to 82% of the time, multiple causes from 18 to 62%. Multiply caused cough has been the result of three diseases up to 42% of the time.     Continue to feel this is upper airway cough syndrome. rec trial of ppi and 1st gen h1 as tol then f/u allergy Dr Maple Hudson if not satisfied with control

## 2011-06-09 NOTE — Assessment & Plan Note (Addendum)
-   Spirometry 09/03/10  FEV 1  1.85 (56%) ratio 70     -  PFT's 06/09/2011 FEV1 2.05 with ratio 67% and DLCO 64 corrects to 91  GOLD II and asymptomatic s tendency to flares of aecopd at present despite chronic maint rx with inderal.  I reviewed the Flethcher curve with patient that basically indicates  if you quit smoking when your best day FEV1 is still well preserved (which his is) it is highly unlikely you will progress to severe disease and informed the patient there was no medication on the market that has proven to change the curve or the likelihood of progression.  Therefore stopping smoking and maintaining abstinence is the most important aspect of care, not choice of inhalers or for that matter, doctors.   He has such mild dz and symptoms he tolerates inderal ok and does not need any inhalers, so pulmonary f/u is as needed

## 2011-06-09 NOTE — Progress Notes (Signed)
  Subjective:    Patient ID: Brian [name], male    DOB: 27-May-1928.   MRN: [id number]  HPI  82 yowm quit smoking 1990  with dx of copd by screening pft's but he was not aware of any limitations until around 2006 with exertion minimally worse since then referred 01/07/2011 by Dr Drue Novel for sob but only very mild copd by pfts 06/09/2011   01/07/2011 Hartlyn Reigel/  Initial pulmonary office eval cc doe x exertion x 5 years, not progressing, not bothering sleep, no am cough or tendency to flares despite longterm maint on inderal for tremors.  Imp mild copd/ pf  rec  No f/u unless symptoms warrant    GB/ pancreatitis > lap chole 02/17/11 [facility] with abn ct abd  11/23 so referred back to pulmonary clinic   02/28/2011 f/u ov/Damontay Alred cc still hoarse, denies limiting doe, cc throat drainage and freq throat clearing in retrospect present for years, no worse lately. No purulent sputum or overt HB, no h/o seasonal rhinitis or exac with colds or eating.  rec Genella Rife diet F/u with pfts and cxr   06/09/2011 f/u ov/Ural Acree cc nose dripping and throat clearing x since summer 2012 day >> night breathing is fine much better overall with no limit from desired activity.   Sleeping ok without nocturnal  or early am exacerbation  of respiratory  C/o's   Also denies any obvious fluctuation of symptoms with weather or environmental changes or other aggravating or alleviating factors except as outlined above   ROS  At present neg for  any significant sore throat, dysphagia, itching, sneezing,   excess/ purulent nasal secretions,  fever, chills, sweats, unintended wt loss, pleuritic or exertional cp, hempoptysis, orthopnea pnd or leg swelling.  Also denies presyncope, palpitations, heartburn, abdominal pain, nausea, vomiting, diarrhea  or change in bowel or urinary habits, dysuria,hematuria,  rash, arthralgias, visual complaints, headache, numbness weakness or ataxia.           Objective:   Physical Exam 178  01/07/11  >   172    02/28/2011 > 06/09/2011  177 Edentulous wm nad minimally hoarse HEENT mild turbinate edema.  Oropharynx no thrush or excess pnd or cobblestoning.  No JVD or cervical adenopathy. Mild accessory muscle hypertrophy. Trachea midline, nl thryroid. Chest was hyperinflated by percussion with diminished breath sounds and moderate increased exp time without wheeze. Hoover sign positive at mid inspiration. Regular rate and rhythm without murmur gallop or rub or increase P2 or edema.  Abd: no hsm, nl excursion. Ext warm without cyanosis  clubbing.        CXR  06/09/2011 : Enlargement of cardiac silhouette.  Emphysematous and minimal bronchitic changes consistent with COPD.  Pulmonary fibrosis, greatest at left base.  No acute abnormalities.          Assessment & Plan:  \

## 2011-06-09 NOTE — Patient Instructions (Signed)
If throat continues to bother you:  Try prilosec 20mg   Take 30-60 min before first meal of the day and Pepcid 20 mg one bedtime  For a month.   For drainage as needed you allegra/clariton (non-drowsy) or try chlortrimeton 4 mg one every 6 hours if needed and if still not happy I can refer you to our allergist Dr Maple Hudson   If you are satisfied with your treatment plan let your doctor know and he/she can either refill your medications or you can return here when your prescription runs out.     If in any way you are not 100% satisfied,  please tell us.  If 100% better, tell your friends!

## 2011-06-10 ENCOUNTER — Other Ambulatory Visit: Payer: Self-pay | Admitting: Internal Medicine

## 2011-06-10 NOTE — Telephone Encounter (Signed)
Refill done.  

## 2011-06-13 ENCOUNTER — Ambulatory Visit (INDEPENDENT_AMBULATORY_CARE_PROVIDER_SITE_OTHER): Payer: Medicare Other | Admitting: Pharmacist

## 2011-06-13 DIAGNOSIS — Z7901 Long term (current) use of anticoagulants: Secondary | ICD-10-CM | POA: Diagnosis not present

## 2011-06-13 DIAGNOSIS — I4891 Unspecified atrial fibrillation: Secondary | ICD-10-CM

## 2011-07-09 ENCOUNTER — Other Ambulatory Visit: Payer: Self-pay | Admitting: Internal Medicine

## 2011-07-09 NOTE — Telephone Encounter (Signed)
Refill done.  

## 2011-07-11 ENCOUNTER — Other Ambulatory Visit: Payer: Self-pay | Admitting: Internal Medicine

## 2011-07-25 ENCOUNTER — Ambulatory Visit (INDEPENDENT_AMBULATORY_CARE_PROVIDER_SITE_OTHER): Payer: Medicare Other | Admitting: Pharmacist

## 2011-07-25 DIAGNOSIS — I4891 Unspecified atrial fibrillation: Secondary | ICD-10-CM

## 2011-07-25 DIAGNOSIS — Z7901 Long term (current) use of anticoagulants: Secondary | ICD-10-CM | POA: Diagnosis not present

## 2011-07-31 ENCOUNTER — Other Ambulatory Visit: Payer: Self-pay | Admitting: Internal Medicine

## 2011-07-31 NOTE — Telephone Encounter (Signed)
Refill done.  

## 2011-08-23 ENCOUNTER — Other Ambulatory Visit: Payer: Self-pay | Admitting: Internal Medicine

## 2011-08-25 NOTE — Telephone Encounter (Signed)
Refill done.  

## 2011-09-04 ENCOUNTER — Encounter: Payer: Self-pay | Admitting: Family Medicine

## 2011-09-04 ENCOUNTER — Other Ambulatory Visit: Payer: Self-pay | Admitting: Internal Medicine

## 2011-09-04 ENCOUNTER — Ambulatory Visit (INDEPENDENT_AMBULATORY_CARE_PROVIDER_SITE_OTHER): Payer: Medicare Other | Admitting: Family Medicine

## 2011-09-04 ENCOUNTER — Telehealth: Payer: Self-pay | Admitting: Internal Medicine

## 2011-09-04 VITALS — BP 156/92 | HR 65 | Temp 97.4°F | Wt 174.6 lb

## 2011-09-04 DIAGNOSIS — R42 Dizziness and giddiness: Secondary | ICD-10-CM

## 2011-09-04 DIAGNOSIS — I10 Essential (primary) hypertension: Secondary | ICD-10-CM | POA: Diagnosis not present

## 2011-09-04 MED ORDER — AMLODIPINE BESYLATE 5 MG PO TABS: 5.0000 mg | ORAL_TABLET | Freq: Every day | ORAL | Status: DC

## 2011-09-04 NOTE — Telephone Encounter (Signed)
Refill done.  

## 2011-09-04 NOTE — Assessment & Plan Note (Signed)
Increase norvasc to 5 mg daily rto 10-14 days

## 2011-09-04 NOTE — Assessment & Plan Note (Signed)
Maybe secondary to bp or pulse F/u Dr Drue Novel in 10-14 days or sooner prn

## 2011-09-04 NOTE — Telephone Encounter (Signed)
Caller: Billie/Spouse; PCP: Willow Ora; CB#: [phone];  Call regarding Dizzy onset 09/04/11 when he woke up @ 0800; BP= 182/82 at 1000 09/04/11- but cuff is too tight(checked in office and  runs high). Pulse =59 Afebrile. His mouth is dry and urine a lttle dark this morning. Take Coumaden. No signs of bleeding. Emergent sx r/o per Dizziness Protocol.  Advised to drink tall glass of water and lay with feet elevated above heart for one hour and will call back to further assess. Call Back @ 1230- Still having some lightheadedness. BP =162/80. No nausea or weakness. Triage and Care advice per Dizziness Protocol and appnt scheduled at 1415 09/04/11 with Dr. Laury Axon.

## 2011-09-04 NOTE — Progress Notes (Signed)
  Subjective:    Patient ID: Brian [name], male    DOB: 05-Apr-1928, 78 y.o.   MRN: [id number]  HPI Pt here with his wife complaining of dizziness this am that has since resolved but his bp has been running high.  No cp, sob etc.    Review of Systems As above    Objective:   Physical Exam  Constitutional: He is oriented to person, place, and time. He appears well-developed and well-nourished.  Neck: Normal range of motion. Neck supple.  Cardiovascular:  No murmur heard.      irreg , irreg  Musculoskeletal: Normal range of motion. He exhibits no edema.  Neurological: He is alert and oriented to person, place, and time.  Psychiatric: He has a normal mood and affect. His behavior is normal. Judgment and thought content normal.          Assessment & Plan:  Dizziness htn

## 2011-09-04 NOTE — Patient Instructions (Addendum)

## 2011-09-05 ENCOUNTER — Ambulatory Visit (INDEPENDENT_AMBULATORY_CARE_PROVIDER_SITE_OTHER): Payer: Medicare Other | Admitting: Pharmacist

## 2011-09-05 DIAGNOSIS — I4891 Unspecified atrial fibrillation: Secondary | ICD-10-CM

## 2011-09-05 DIAGNOSIS — Z7901 Long term (current) use of anticoagulants: Secondary | ICD-10-CM

## 2011-09-05 LAB — POCT INR: INR: 2

## 2011-09-08 ENCOUNTER — Other Ambulatory Visit: Payer: Self-pay | Admitting: Internal Medicine

## 2011-09-09 NOTE — Telephone Encounter (Signed)
Refill done.  

## 2011-09-15 ENCOUNTER — Encounter: Payer: Self-pay | Admitting: Internal Medicine

## 2011-09-15 ENCOUNTER — Ambulatory Visit (INDEPENDENT_AMBULATORY_CARE_PROVIDER_SITE_OTHER): Payer: Medicare Other | Admitting: Internal Medicine

## 2011-09-15 VITALS — BP 116/70 | HR 48 | Temp 97.9°F | Wt 174.0 lb

## 2011-09-15 DIAGNOSIS — R42 Dizziness and giddiness: Secondary | ICD-10-CM

## 2011-09-15 DIAGNOSIS — I10 Essential (primary) hypertension: Secondary | ICD-10-CM | POA: Diagnosis not present

## 2011-09-15 MED ORDER — HYDROCORTISONE ACE-PRAMOXINE 2.5-1 & 1 % RE KIT: PACK | RECTAL | Status: DC

## 2011-09-15 MED ORDER — HYDROCORTISONE ACETATE 25 MG RE SUPP: 25.0000 mg | Freq: Two times a day (BID) | RECTAL | Status: DC | PRN

## 2011-09-15 MED ORDER — HYDROCORTISONE ACETATE 0.5 % EX CREA: 1.0000 "application " | TOPICAL_CREAM | Freq: Two times a day (BID) | CUTANEOUS | Status: DC

## 2011-09-15 NOTE — Assessment & Plan Note (Signed)
Doing  well with recent increase of amlodipine from 2.5 to 5 mg

## 2011-09-15 NOTE — Progress Notes (Signed)
  Subjective:    Patient ID: Brian [name], male    DOB: January 07, 1929, 77 y.o.   MRN: [id number]  HPI Followup visit He was seen approximately 2 weeks ago with lightheadedness, there was no associated headache, slurred speech or motor deficits. His BP was slightly elevated --> amlodipine dose was increased from 2.5 to 5 mg. Since then, no further symptoms. Ambulatory blood pressure under excellent control.  Past Medical History  Diagnosis Date  . Muscle tremor     saw neurology remotely elsewhere, was Rx inderal  . Crohn's ileocolitis   . Osteopenia     per DEXA 11/2008  . Hypertension 11/11    mild  . Hemorrhoids   . Hypertrophy of prostate     w/o UR obst & oth luts  . Peripheral neuropathy     h/o neg w/u  . Osteoarthritis     ankle,right  . ED (erectile dysfunction)   . Tremor   . Hypertriglyceridemia   . B12 deficiency   . Gallstones   . COPD (chronic obstructive pulmonary disease)     recent dx--no acute problems  . Shortness of breath     at times with w/excertion  . Cancer     squamous cell skin cancers-tx'd q51yrs  . Hiatal hernia 1956    found while in service  . Anxiety   . Atrial fibrillation with RVR   . Encounter for long-term (current) use of anticoagulants 02/21/2011     Review of Systems No chest pain or shortness of breath Very mild lower extremity edema, going on for a year, not worse in the last few weeks.     Objective:   Physical Exam  Alert oriented x3, no apparent distress Neck--normal carotid pulses Lungs are clear to auscultation bilaterally Cardiovascular -- bradycardic Extremities without edema. Neurological exam--speech is clear, gait is normal, motor exam-face symmetric.      Assessment & Plan:

## 2011-09-15 NOTE — Assessment & Plan Note (Signed)
Single episode of lightheadedness resolved. Recommend observation

## 2011-09-18 ENCOUNTER — Ambulatory Visit: Payer: Medicare Other | Admitting: Internal Medicine

## 2011-10-17 ENCOUNTER — Ambulatory Visit (INDEPENDENT_AMBULATORY_CARE_PROVIDER_SITE_OTHER): Payer: Medicare Other | Admitting: *Deleted

## 2011-10-17 DIAGNOSIS — I4891 Unspecified atrial fibrillation: Secondary | ICD-10-CM

## 2011-10-17 DIAGNOSIS — Z7901 Long term (current) use of anticoagulants: Secondary | ICD-10-CM

## 2011-10-29 ENCOUNTER — Other Ambulatory Visit: Payer: Self-pay | Admitting: Cardiology

## 2011-11-28 ENCOUNTER — Ambulatory Visit (INDEPENDENT_AMBULATORY_CARE_PROVIDER_SITE_OTHER): Payer: Medicare Other

## 2011-11-28 DIAGNOSIS — I4891 Unspecified atrial fibrillation: Secondary | ICD-10-CM | POA: Diagnosis not present

## 2011-11-28 DIAGNOSIS — Z7901 Long term (current) use of anticoagulants: Secondary | ICD-10-CM | POA: Diagnosis not present

## 2011-11-28 LAB — POCT INR: INR: 3

## 2011-12-03 DIAGNOSIS — D235 Other benign neoplasm of skin of trunk: Secondary | ICD-10-CM | POA: Diagnosis not present

## 2011-12-03 DIAGNOSIS — Z85828 Personal history of other malignant neoplasm of skin: Secondary | ICD-10-CM | POA: Diagnosis not present

## 2011-12-03 DIAGNOSIS — L570 Actinic keratosis: Secondary | ICD-10-CM | POA: Diagnosis not present

## 2011-12-13 ENCOUNTER — Other Ambulatory Visit: Payer: Self-pay | Admitting: Internal Medicine

## 2011-12-15 NOTE — Telephone Encounter (Signed)
Refill done.  

## 2011-12-16 ENCOUNTER — Other Ambulatory Visit: Payer: Self-pay | Admitting: *Deleted

## 2011-12-16 MED ORDER — BUSPIRONE HCL 15 MG PO TABS: 15.0000 mg | ORAL_TABLET | Freq: Two times a day (BID) | ORAL | Status: DC

## 2012-01-02 ENCOUNTER — Encounter: Payer: Self-pay | Admitting: Internal Medicine

## 2012-01-02 ENCOUNTER — Ambulatory Visit (INDEPENDENT_AMBULATORY_CARE_PROVIDER_SITE_OTHER): Payer: Medicare Other | Admitting: Internal Medicine

## 2012-01-02 ENCOUNTER — Other Ambulatory Visit: Payer: Self-pay | Admitting: Internal Medicine

## 2012-01-02 VITALS — BP 124/76 | HR 43 | Temp 97.5°F | Ht 73.0 in | Wt 172.6 lb

## 2012-01-02 DIAGNOSIS — I10 Essential (primary) hypertension: Secondary | ICD-10-CM | POA: Diagnosis not present

## 2012-01-02 DIAGNOSIS — E781 Pure hyperglyceridemia: Secondary | ICD-10-CM

## 2012-01-02 DIAGNOSIS — E538 Deficiency of other specified B group vitamins: Secondary | ICD-10-CM

## 2012-01-02 DIAGNOSIS — F528 Other sexual dysfunction not due to a substance or known physiological condition: Secondary | ICD-10-CM

## 2012-01-02 DIAGNOSIS — M899 Disorder of bone, unspecified: Secondary | ICD-10-CM

## 2012-01-02 DIAGNOSIS — R7309 Other abnormal glucose: Secondary | ICD-10-CM | POA: Diagnosis not present

## 2012-01-02 DIAGNOSIS — R739 Hyperglycemia, unspecified: Secondary | ICD-10-CM

## 2012-01-02 DIAGNOSIS — M949 Disorder of cartilage, unspecified: Secondary | ICD-10-CM

## 2012-01-02 DIAGNOSIS — Z0000 Encounter for general adult medical examination without abnormal findings: Secondary | ICD-10-CM

## 2012-01-02 DIAGNOSIS — R259 Unspecified abnormal involuntary movements: Secondary | ICD-10-CM

## 2012-01-02 LAB — CBC WITH DIFFERENTIAL/PLATELET
Basophils Relative: 0.3 % (ref 0.0–3.0)
Eosinophils Relative: 1.3 % (ref 0.0–5.0)
HCT: 41.9 % (ref 39.0–52.0)
Hemoglobin: 14.1 g/dL (ref 13.0–17.0)
Lymphs Abs: 2.2 10*3/uL (ref 0.7–4.0)
MCV: 93.1 fl (ref 78.0–100.0)
Monocytes Absolute: 0.6 10*3/uL (ref 0.1–1.0)
RBC: 4.51 Mil/uL (ref 4.22–5.81)
WBC: 8.1 10*3/uL (ref 4.5–10.5)

## 2012-01-02 LAB — LIPID PANEL
Cholesterol: 130 mg/dL (ref 0–200)
VLDL: 19.8 mg/dL (ref 0.0–40.0)

## 2012-01-02 LAB — ALT: ALT: 13 U/L (ref 0–53)

## 2012-01-02 LAB — BASIC METABOLIC PANEL
CO2: 25 mEq/L (ref 19–32)
Calcium: 9.1 mg/dL (ref 8.4–10.5)
GFR: 76.12 mL/min (ref >60.00)
Sodium: 139 mEq/L (ref 135–145)

## 2012-01-02 LAB — AST: AST: 23 U/L (ref 0–37)

## 2012-01-02 LAB — HEMOGLOBIN A1C: Hgb A1c MFr Bld: 5.7 % (ref 4.6–6.5)

## 2012-01-02 MED ORDER — ZOSTER VACCINE LIVE 19400 UNT/0.65ML ~~LOC~~ SOLR: 0.6500 mL | Freq: Once | SUBCUTANEOUS | Status: DC

## 2012-01-02 MED ORDER — BUSPIRONE HCL 15 MG PO TABS: 15.0000 mg | ORAL_TABLET | Freq: Two times a day (BID) | ORAL | Status: DC

## 2012-01-02 NOTE — Telephone Encounter (Signed)
Refill done.  

## 2012-01-02 NOTE — Assessment & Plan Note (Signed)
Good compliance with Nascobal, check labs

## 2012-01-02 NOTE — Assessment & Plan Note (Signed)
Not interested in taking Viagra at this point

## 2012-01-02 NOTE — Assessment & Plan Note (Signed)
Good compliance with lopid, check labs

## 2012-01-02 NOTE — Assessment & Plan Note (Signed)
States he had a DEXA, I can find the report, states will bring it to me rec ca and vit d

## 2012-01-02 NOTE — Assessment & Plan Note (Addendum)
Well-controlled with amlodipine 5 mg, very mild edema. No change. His pulse is in the 40s, he is asymptomatic but I am concerned. Decreased Inderal from 80 mg twice a day to 40 mg 3 times a day. See instructions

## 2012-01-02 NOTE — Assessment & Plan Note (Signed)
Decrease in the dose of Inderal, patient will call if tremor increases

## 2012-01-02 NOTE — Assessment & Plan Note (Addendum)
chart reviewed Td 2000, 2012, Pneumonia shot 2009, flu shot--states will get it soon Shingles immunization discussed again , Rx provided  colonoscopy in 2007, 04/2008, 03-2010 (next 5 years)  Prostate cancer screening, no FH, asx, stable PSAs. No need for screening at this point  Last TSH is slightly elevated, recheck. A1c was 5.7, recheck  diet and exercise discussed

## 2012-01-02 NOTE — Progress Notes (Signed)
Subjective:    Patient ID: Brian [name], male    DOB: 10/16/1928, 78 y.o.   MRN: [id number]  HPI Here for Medicare AWV: 1.         Risk factors based on Past M, S, F history: yes   2.         Physical Activities: active w/yard work-home chores 3.         Depression/mood: denies , no problems noted   4.         Hearing: decreased a little, no tinnitus, not affecting ADL, declined  Audiologist    5.         ADL's: totally independent , still drives   6.         Fall Risk: prevention discussed 7.         Home Safety: does feels afe at home   8.         Height, weight, &visual acuity: see VS, vision good w/  correction   9.         Counseling: yes , see below   10.       Labs ordered based on risk factors:  yes   11.           Referral Coordination: if needed   12.           Care Plan: see a/p   13.            Cognitive Assessment --motor skills satisfactory but slt more frail than in previous years , memory and cognition seems normal  In addition, we discussed the following issues Atrial fibrillation, good compliance with medication. Anxiety, needs a refill on BuSpar. Hemorrhoids, currently having pain and discomfort, using creams as prescribed, getting slightly better. Denies any bleeding. High triglycerides, good compliance with meds  B12 deficiency, good compliance with [place] Hypertension, BP is better with the increased  Amlodipine dose, has developed trace edema. Pulse often time is in the 40s, he is asymptomatic, denies weakness, lightheadedness or dizziness.  Past Medical History: Muscle tremor, saw neurology remotely elsewhere, was Rx inderal  A fib w/ RVR  Hypertriglyceridemia HTN dx 2011 Anxiety COPD DJD H/o peripheral neuropathy with negative workup B12 DEFICIENCY   Crohn's ileocolitis Osteopenia per DEXA 9-10 Skin ca [facility]  BPH ED Hemorrhoids   Past Surgical History  Procedure Date  . Resection of distal ileum and cecum w/ appendectomy     18-inch small  intestine, 1984 for Crohn's disease  . Colon surgery 1983    has chron's  . Appendectomy 1983  . Cholecystectomy 02/17/2011    Procedure: LAPAROSCOPIC CHOLECYSTECTOMY WITH INTRAOPERATIVE CHOLANGIOGRAM;  Surgeon: Mariella Saa, MD;  Location: [facility];  Service: General;  Laterality: N/A;   Family History  Problem Relation Age of Onset  . Crohn's disease Brother   . Breast cancer Mother   . Colon cancer Neg Hx   . Prostate cancer Neg Hx   . Heart attack Father 47  . Dementia Sister   . Diabetes Sister      Social History: Married 4 children lost mother 10-11 former smoker , quit 1992 Alcohol Use - yes one beer every 2-3 weeks Illicit Drug Use - no   Review of Systems Denies nausea, vomiting, diarrhea or abdominal pain. No dysuria gross hematuria. From time to time has a 30 second, sharp pain between the left armpit and elbow,  at rest, not associated with substernal chest pain, nausea, vomiting or diaphoresis.  Usually happen the day after he does yard work. (recommend observation)     Objective:   Physical Exam General -- alert, well-developed, and well-nourished.   Neck --no thyromegaly Lungs -- normal respiratory effort, no intercostal retractions, no accessory muscle use, and normal breath sounds.   Heart-- irregularly irregular, bradycardic.   Abdomen--soft, non-tender, no distention, no masses, no HSM, no guarding, and no rigidity.   Extremities-- trace pretibial edema bilaterally Neurologic-- alert & oriented X3 , tremor noted, mostly in his head Psych-- Cognition and judgment appear intact. Alert and cooperative with normal attention span and concentration.  not anxious appearing and not depressed appearing.       Assessment & Plan:

## 2012-01-02 NOTE — Patient Instructions (Signed)
Decrease inderal to 1/2 tab 3 times a day Your pulse should be around 60 Check the  blood pressure 2 or 3 times a week, be sure it is between 110/60 and 140/85. If it is consistently higher or lower, let me know

## 2012-01-06 ENCOUNTER — Encounter: Payer: Self-pay | Admitting: *Deleted

## 2012-01-07 ENCOUNTER — Other Ambulatory Visit: Payer: Self-pay | Admitting: Cardiology

## 2012-01-09 ENCOUNTER — Ambulatory Visit (INDEPENDENT_AMBULATORY_CARE_PROVIDER_SITE_OTHER): Payer: Medicare Other | Admitting: *Deleted

## 2012-01-09 ENCOUNTER — Other Ambulatory Visit: Payer: Self-pay | Admitting: *Deleted

## 2012-01-09 DIAGNOSIS — Z7901 Long term (current) use of anticoagulants: Secondary | ICD-10-CM

## 2012-01-09 DIAGNOSIS — I4891 Unspecified atrial fibrillation: Secondary | ICD-10-CM | POA: Diagnosis not present

## 2012-01-09 DIAGNOSIS — I10 Essential (primary) hypertension: Secondary | ICD-10-CM

## 2012-01-09 MED ORDER — AMLODIPINE BESYLATE 5 MG PO TABS: 5.0000 mg | ORAL_TABLET | Freq: Every day | ORAL | Status: DC

## 2012-01-09 NOTE — Telephone Encounter (Signed)
Refill done.  

## 2012-01-14 ENCOUNTER — Telehealth: Payer: Self-pay

## 2012-01-14 DIAGNOSIS — I10 Essential (primary) hypertension: Secondary | ICD-10-CM

## 2012-01-14 NOTE — Telephone Encounter (Signed)
Send it again to be sure they got it, let pt know

## 2012-01-14 NOTE — Telephone Encounter (Signed)
Caller: Rene/Patient; Patient Name: Brian [name]; PCP: Willow Ora; Best Callback Phone Number: [phone] States had physical "2 weeks ago" and MD advised increasing Amlodipine to 5mg .  Uses [facility]/[phone] has not received script for Amlodipine 5mg . Needs  as soon as can be sent as has been taking wife's medicine as she takes same medicine. States usually gets 90 days supply. Per EPIC, script was sent to [facility] but hasn ot been received.   PLEASE CALL PATIENT AND ADVISE THAT SCRIPT HAS BEEN SENT.

## 2012-01-14 NOTE — Telephone Encounter (Signed)
Pt called in and left a message on triage line during lunch and didn't give any information of what was needed. I called left message on pt phone to call us back.     MW

## 2012-01-15 MED ORDER — AMLODIPINE BESYLATE 5 MG PO TABS: 5.0000 mg | ORAL_TABLET | Freq: Every day | ORAL | Status: DC

## 2012-01-15 NOTE — Telephone Encounter (Signed)
[facility]. Left detailed msg on vmail letting pt know.

## 2012-01-26 MED ORDER — AMLODIPINE BESYLATE 5 MG PO TABS: 5.0000 mg | ORAL_TABLET | Freq: Every day | ORAL | Status: DC

## 2012-01-26 MED ORDER — BUSPIRONE HCL 15 MG PO TABS: 15.0000 mg | ORAL_TABLET | Freq: Two times a day (BID) | ORAL | Status: DC

## 2012-01-26 NOTE — Addendum Note (Signed)
Addended by: Edwena Felty T on: 01/26/2012 11:47 AM   Modules accepted: Orders

## 2012-01-26 NOTE — Addendum Note (Signed)
Addended by: Edwena Felty T on: 01/26/2012 11:43 AM   Modules accepted: Orders

## 2012-01-26 NOTE — Telephone Encounter (Signed)
Re-[facility] buspar 15mg  [facility].

## 2012-02-04 DIAGNOSIS — Z23 Encounter for immunization: Secondary | ICD-10-CM | POA: Diagnosis not present

## 2012-02-17 ENCOUNTER — Ambulatory Visit (INDEPENDENT_AMBULATORY_CARE_PROVIDER_SITE_OTHER): Payer: Medicare Other | Admitting: *Deleted

## 2012-02-17 DIAGNOSIS — I4891 Unspecified atrial fibrillation: Secondary | ICD-10-CM | POA: Diagnosis not present

## 2012-02-17 DIAGNOSIS — Z7901 Long term (current) use of anticoagulants: Secondary | ICD-10-CM | POA: Diagnosis not present

## 2012-02-17 LAB — POCT INR: INR: 2.9

## 2012-02-20 ENCOUNTER — Other Ambulatory Visit: Payer: Self-pay | Admitting: Internal Medicine

## 2012-02-20 NOTE — Telephone Encounter (Signed)
Refill done.  

## 2012-03-02 DIAGNOSIS — D485 Neoplasm of uncertain behavior of skin: Secondary | ICD-10-CM | POA: Diagnosis not present

## 2012-03-02 DIAGNOSIS — C490 Malignant neoplasm of connective and soft tissue of head, face and neck: Secondary | ICD-10-CM | POA: Diagnosis not present

## 2012-03-08 ENCOUNTER — Ambulatory Visit (INDEPENDENT_AMBULATORY_CARE_PROVIDER_SITE_OTHER): Payer: Medicare Other | Admitting: Family Medicine

## 2012-03-08 ENCOUNTER — Ambulatory Visit: Payer: Medicare Other | Admitting: Internal Medicine

## 2012-03-08 ENCOUNTER — Encounter: Payer: Self-pay | Admitting: Family Medicine

## 2012-03-08 VITALS — BP 142/72 | HR 59 | Temp 97.6°F | Wt 172.2 lb

## 2012-03-08 DIAGNOSIS — I10 Essential (primary) hypertension: Secondary | ICD-10-CM | POA: Diagnosis not present

## 2012-03-08 NOTE — Progress Notes (Signed)
  Subjective:    Patient here for follow-up of elevated blood pressure.  He is not exercising and is adherent to a low-salt diet.  Blood pressure is not well controlled at home. Cardiac symptoms: none. Patient denies: chest pain, chest pressure/discomfort, claudication, dyspnea, exertional chest pressure/discomfort, fatigue, irregular heart beat, lower extremity edema, near-syncope, orthopnea, palpitations, paroxysmal nocturnal dyspnea, syncope and tachypnea. Cardiovascular risk factors: advanced age (older than 65 for men, 68 for women), hypertension and male gender. Use of agents associated with hypertension: none. History of target organ damage: none.  The following portions of the patient's history were reviewed and updated as appropriate: allergies, current medications, past family history, past medical history, past social history, past surgical history and problem list.  Review of Systems Pertinent items are noted in HPI.     Objective:    BP 142/72  Pulse 59  Temp 97.6 F (36.4 C) (Oral)  Wt 172 lb 3.2 oz (78.109 kg)  SpO2 95% General appearance: alert, cooperative, appears stated age and no distress Head: Normocephalic, without obvious abnormality, atraumatic Lungs: clear to auscultation bilaterally Heart: S1, S2 normal Extremities: extremities normal, atraumatic, no cyanosis or edema    Assessment:    Hypertension, normal blood pressure . Evidence of target organ damage: none.    Plan:    Medication: no change. Dietary sodium restriction. Regular aerobic exercise. Check blood pressures 1 times daily and record. Follow up: 3 weeks and as needed.

## 2012-03-08 NOTE — Patient Instructions (Addendum)

## 2012-03-15 ENCOUNTER — Other Ambulatory Visit: Payer: Self-pay | Admitting: Internal Medicine

## 2012-03-15 NOTE — Telephone Encounter (Signed)
Done

## 2012-03-15 NOTE — Telephone Encounter (Signed)
Needs to call GI

## 2012-03-15 NOTE — Telephone Encounter (Signed)
Please advise on Pentasa. OK to refill?

## 2012-03-22 ENCOUNTER — Telehealth: Payer: Self-pay | Admitting: Cardiology

## 2012-03-22 NOTE — Telephone Encounter (Signed)
Pt calling to talk to nurse re MOHS surgery he's having 03-29-12

## 2012-03-22 NOTE — Telephone Encounter (Signed)
**Note De-Identified  Obfuscation** Pt. 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 Alean Rinne and wants to know about taking his Coumadin before surgery. When asked is surgeon is aware pt. Is taking Coumadin the pt stated he is not sure. Pt. Is advised to contact Dr. Beatris Ship office and if Coumadin is to be held for surgery to ask that office to contact us for Coumadin instructions. He verbalized understanding.

## 2012-03-29 DIAGNOSIS — C4499 Other specified malignant neoplasm of skin, unspecified: Secondary | ICD-10-CM | POA: Diagnosis not present

## 2012-03-29 DIAGNOSIS — C4449 Other specified malignant neoplasm of skin of scalp and neck: Secondary | ICD-10-CM | POA: Diagnosis not present

## 2012-03-30 ENCOUNTER — Ambulatory Visit (INDEPENDENT_AMBULATORY_CARE_PROVIDER_SITE_OTHER): Payer: Medicare Other | Admitting: *Deleted

## 2012-03-30 DIAGNOSIS — Z7901 Long term (current) use of anticoagulants: Secondary | ICD-10-CM | POA: Diagnosis not present

## 2012-03-30 DIAGNOSIS — I4891 Unspecified atrial fibrillation: Secondary | ICD-10-CM | POA: Diagnosis not present

## 2012-04-12 ENCOUNTER — Ambulatory Visit (INDEPENDENT_AMBULATORY_CARE_PROVIDER_SITE_OTHER): Payer: Medicare Other | Admitting: Gastroenterology

## 2012-04-12 ENCOUNTER — Encounter: Payer: Self-pay | Admitting: Gastroenterology

## 2012-04-12 VITALS — BP 126/62 | HR 64 | Ht 72.0 in | Wt 172.0 lb

## 2012-04-12 DIAGNOSIS — K5080 Crohn's disease of both small and large intestine without complications: Secondary | ICD-10-CM | POA: Diagnosis not present

## 2012-04-12 DIAGNOSIS — Z860100 Personal history of colon polyps, unspecified: Secondary | ICD-10-CM | POA: Insufficient documentation

## 2012-04-12 MED ORDER — MESALAMINE ER 250 MG PO CPCR: ORAL_CAPSULE | ORAL | Status: DC

## 2012-04-12 NOTE — Patient Instructions (Addendum)
We have sent your prescription of Pentasa to your mail order pharmacy.   We have cancelled your Recall Colonoscopy.  Thank you for choosing me and [facility].  Venita Lick. Pleas Koch., MD., Clementeen Graham

## 2012-04-12 NOTE — Progress Notes (Signed)
History of Present Illness: This is an 79 year old male with a history of Crohn's ileocolitis, status post right colon resection in 1984. He last underwent colonoscopy in January 2012 showing inactive colitis and an adenomatous colon polyp. He has no gastrointestinal complaints and returns for routine followup. Denies weight loss, abdominal pain, constipation, diarrhea, change in stool caliber, melena, hematochezia, nausea, vomiting, dysphagia, reflux symptoms, chest pain.  Current Medications, Allergies, Past Medical History, Past Surgical History, Family History and Social History were reviewed in [employer] record.  Physical Exam: General: Well developed , well nourished, no acute distress Head: Normocephalic and atraumatic Eyes:  sclerae anicteric, EOMI Ears: Normal auditory acuity Mouth: No deformity or lesions Lungs: Clear throughout to auscultation Heart: Regular rate and rhythm; no murmurs, rubs or bruits Abdomen: Soft, non tender and non distended. No masses, hepatosplenomegaly or hernias noted. Normal Bowel sounds Musculoskeletal: Symmetrical with no gross deformities  Pulses:  Normal pulses noted Extremities: No clubbing, cyanosis, edema or deformities noted Neurological: Alert oriented x 4, grossly nonfocal Psychological:  Alert and cooperative. Normal mood and affect  Assessment and Recommendations:  1. Crohn's ileocolitis. Prior right hemicolectomy. Renew Pentasa at 500 mg twice daily. Given his age and lack of inflammation on his last colonoscopy there are no plans for routine surveillance colonoscopy. BMET, CBC in October were normal.  2. B12 deficient. Managed by Dr. Drue Novel. B12 in October was normal.  3. Personal history of adenomatous colon polyps. Given his age there no plans for routine surveillance colonoscopy.

## 2012-05-11 ENCOUNTER — Ambulatory Visit (INDEPENDENT_AMBULATORY_CARE_PROVIDER_SITE_OTHER): Payer: Medicare Other | Admitting: *Deleted

## 2012-05-11 DIAGNOSIS — Z7901 Long term (current) use of anticoagulants: Secondary | ICD-10-CM

## 2012-05-11 DIAGNOSIS — I4891 Unspecified atrial fibrillation: Secondary | ICD-10-CM

## 2012-05-11 LAB — POCT INR: INR: 2.7

## 2012-06-01 ENCOUNTER — Ambulatory Visit: Payer: Medicare Other | Admitting: Internal Medicine

## 2012-06-01 ENCOUNTER — Encounter: Payer: Self-pay | Admitting: Internal Medicine

## 2012-06-01 ENCOUNTER — Ambulatory Visit (INDEPENDENT_AMBULATORY_CARE_PROVIDER_SITE_OTHER): Payer: Medicare Other | Admitting: Internal Medicine

## 2012-06-01 ENCOUNTER — Ambulatory Visit (INDEPENDENT_AMBULATORY_CARE_PROVIDER_SITE_OTHER)
Admission: RE | Admit: 2012-06-01 | Discharge: 2012-06-01 | Disposition: A | Discharge status: Home / Self Care | Payer: Medicare Other | Source: Ambulatory Visit | Attending: Internal Medicine | Admitting: Internal Medicine

## 2012-06-01 VITALS — BP 118/82 | HR 57 | Temp 96.1°F | Wt 170.0 lb

## 2012-06-01 DIAGNOSIS — S43402A Unspecified sprain of left shoulder joint, initial encounter: Secondary | ICD-10-CM

## 2012-06-01 DIAGNOSIS — IMO0002 Reserved for concepts with insufficient information to code with codable children: Secondary | ICD-10-CM

## 2012-06-01 DIAGNOSIS — S4980XA Other specified injuries of shoulder and upper arm, unspecified arm, initial encounter: Secondary | ICD-10-CM | POA: Diagnosis not present

## 2012-06-01 DIAGNOSIS — M25519 Pain in unspecified shoulder: Secondary | ICD-10-CM | POA: Diagnosis not present

## 2012-06-01 DIAGNOSIS — S46909A Unspecified injury of unspecified muscle, fascia and tendon at shoulder and upper arm level, unspecified arm, initial encounter: Secondary | ICD-10-CM | POA: Diagnosis not present

## 2012-06-01 NOTE — Assessment & Plan Note (Addendum)
Mechanical fall a few days ago, hurting on the shoulder, he has good range of motion, motor exam normal. To be sure will get x-ray otherwise recommend conservative treatment with Tylenol, also recommend to keep the range of motion going. See instructions.

## 2012-06-01 NOTE — Patient Instructions (Addendum)
Warm compress as needed  Tylenol  500 mg OTC 2 tabs a day every 8 hours as needed for pain Avoid motrin , naproxen or similar meds  Call if no better in 2 weeks Keep that shoulder moving

## 2012-06-01 NOTE — Progress Notes (Signed)
  Subjective:    Patient ID: Brian [name], male    DOB: [birth year], 79 y.o.   MRN: [id number]  HPI Acute visit  10 days ago he fell from a chair, it was an accident, no syncope. Landed on his left forearm, 5 days later developed pain at the left shoulder, worse with certain movement particularly if he stands up with his arms down or if he rolls in bed to the right. Pain also triggered by turning his head to the left.   Past Medical History:  Muscle tremor, saw neurology remotely elsewhere, was Rx inderal  A fib w/ RVR  Hypertriglyceridemia  HTN dx 2011  Anxiety  COPD  DJD  H/o peripheral neuropathy with negative workup  B12 DEFICIENCY  Crohn's ileocolitis  Osteopenia per DEXA 9-10  Skin ca [facility]  BPH  ED  Hemorrhoids  Past Surgical History  Procedure Laterality Date  . Resection of distal ileum and cecum w/ appendectomy      18-inch small intestine, 1984 for Crohn's disease  . Colon surgery  1983    has chrohn's  . Appendectomy  1983  . Cholecystectomy  02/17/2011    Procedure: LAPAROSCOPIC CHOLECYSTECTOMY WITH INTRAOPERATIVE CHOLANGIOGRAM;  Surgeon: Mariella Saa, MD;  Location: [facility];  Service: General;  Laterality: N/A;     Review of Systems  denies other injuries. Mild left arm weakness?Marland Kitchen No neck pain per se. No upper extremity or lower extremity paresthesias.     Objective:   Physical Exam  Musculoskeletal:       Arms:  General -- alert, well-developed  Extremities-- shoulders symmetric, range of motion full bilaterally, no deformities. Neurologic-- alert & oriented X3 ; strength normal in all extremities. Psych-- Cognition and judgment appear intact. Alert and cooperative with normal attention span and concentration.  not anxious appearing and not depressed appearing.       Assessment & Plan:

## 2012-06-22 ENCOUNTER — Ambulatory Visit (INDEPENDENT_AMBULATORY_CARE_PROVIDER_SITE_OTHER): Payer: Medicare Other | Admitting: *Deleted

## 2012-06-22 DIAGNOSIS — Z7901 Long term (current) use of anticoagulants: Secondary | ICD-10-CM

## 2012-06-22 DIAGNOSIS — I4891 Unspecified atrial fibrillation: Secondary | ICD-10-CM

## 2012-06-22 LAB — POCT INR: INR: 2.7

## 2012-07-03 ENCOUNTER — Other Ambulatory Visit: Payer: Self-pay | Admitting: Cardiology

## 2012-08-06 ENCOUNTER — Ambulatory Visit (INDEPENDENT_AMBULATORY_CARE_PROVIDER_SITE_OTHER): Payer: Medicare Other | Admitting: Pharmacist

## 2012-08-06 ENCOUNTER — Other Ambulatory Visit: Payer: Self-pay | Admitting: Internal Medicine

## 2012-08-06 DIAGNOSIS — Z7901 Long term (current) use of anticoagulants: Secondary | ICD-10-CM | POA: Diagnosis not present

## 2012-08-06 DIAGNOSIS — I4891 Unspecified atrial fibrillation: Secondary | ICD-10-CM | POA: Diagnosis not present

## 2012-08-06 LAB — POCT INR: INR: 2.3

## 2012-08-06 NOTE — Telephone Encounter (Signed)
Refill done.  

## 2012-09-17 ENCOUNTER — Ambulatory Visit (INDEPENDENT_AMBULATORY_CARE_PROVIDER_SITE_OTHER): Payer: Medicare Other | Admitting: *Deleted

## 2012-09-17 DIAGNOSIS — Z7901 Long term (current) use of anticoagulants: Secondary | ICD-10-CM

## 2012-09-17 DIAGNOSIS — I4891 Unspecified atrial fibrillation: Secondary | ICD-10-CM | POA: Diagnosis not present

## 2012-10-24 ENCOUNTER — Other Ambulatory Visit: Payer: Self-pay | Admitting: Internal Medicine

## 2012-10-28 DIAGNOSIS — M216X9 Other acquired deformities of unspecified foot: Secondary | ICD-10-CM | POA: Diagnosis not present

## 2012-10-29 ENCOUNTER — Ambulatory Visit (INDEPENDENT_AMBULATORY_CARE_PROVIDER_SITE_OTHER): Payer: Medicare Other

## 2012-10-29 DIAGNOSIS — I4891 Unspecified atrial fibrillation: Secondary | ICD-10-CM | POA: Diagnosis not present

## 2012-10-29 DIAGNOSIS — Z7901 Long term (current) use of anticoagulants: Secondary | ICD-10-CM

## 2012-10-29 LAB — POCT INR: INR: 2.2

## 2012-11-10 ENCOUNTER — Other Ambulatory Visit: Payer: Self-pay | Admitting: Internal Medicine

## 2012-11-11 NOTE — Telephone Encounter (Signed)
Refill done by protocol. Mailed letter to notify pt to make a appointment

## 2012-12-04 ENCOUNTER — Other Ambulatory Visit: Payer: Self-pay | Admitting: Internal Medicine

## 2012-12-05 ENCOUNTER — Other Ambulatory Visit: Payer: Self-pay | Admitting: Internal Medicine

## 2012-12-06 NOTE — Telephone Encounter (Signed)
rx refilled per protocol. DJR  

## 2012-12-09 ENCOUNTER — Other Ambulatory Visit: Payer: Self-pay | Admitting: Cardiology

## 2012-12-10 ENCOUNTER — Ambulatory Visit (INDEPENDENT_AMBULATORY_CARE_PROVIDER_SITE_OTHER): Payer: Medicare Other | Admitting: *Deleted

## 2012-12-10 DIAGNOSIS — Z7901 Long term (current) use of anticoagulants: Secondary | ICD-10-CM

## 2012-12-10 DIAGNOSIS — I4891 Unspecified atrial fibrillation: Secondary | ICD-10-CM

## 2012-12-10 LAB — POCT INR: INR: 2.6

## 2013-01-04 DIAGNOSIS — Z23 Encounter for immunization: Secondary | ICD-10-CM | POA: Diagnosis not present

## 2013-01-06 ENCOUNTER — Telehealth: Payer: Self-pay

## 2013-01-06 NOTE — Telephone Encounter (Addendum)
Medication List and allergies: done   for 90 day supply (mail order) [facility]  for local prescriptions [facility] [address]  Immunizations due: UTD will bring date of shingles vaccine at visit  A/P:  LAST: HM due: PSA: Not on file  CCS: UTD 12/2010 DM: Due 12/2011   HTN: Due 12/2011   Lipids: Due 12/2011  To Discuss with Provider: Swelling around ankles x 6 months Propanalol change dosage Buspar (been taking half pill) change dosage

## 2013-01-07 ENCOUNTER — Ambulatory Visit (INDEPENDENT_AMBULATORY_CARE_PROVIDER_SITE_OTHER): Payer: Medicare Other | Admitting: Internal Medicine

## 2013-01-07 ENCOUNTER — Encounter: Payer: Self-pay | Admitting: Internal Medicine

## 2013-01-07 VITALS — BP 145/71 | HR 64 | Temp 98.0°F | Ht 72.5 in | Wt 167.0 lb

## 2013-01-07 DIAGNOSIS — J4489 Other specified chronic obstructive pulmonary disease: Secondary | ICD-10-CM

## 2013-01-07 DIAGNOSIS — R7309 Other abnormal glucose: Secondary | ICD-10-CM

## 2013-01-07 DIAGNOSIS — Z0000 Encounter for general adult medical examination without abnormal findings: Secondary | ICD-10-CM | POA: Diagnosis not present

## 2013-01-07 DIAGNOSIS — I10 Essential (primary) hypertension: Secondary | ICD-10-CM

## 2013-01-07 DIAGNOSIS — E781 Pure hyperglyceridemia: Secondary | ICD-10-CM

## 2013-01-07 DIAGNOSIS — Z125 Encounter for screening for malignant neoplasm of prostate: Secondary | ICD-10-CM | POA: Diagnosis not present

## 2013-01-07 DIAGNOSIS — R259 Unspecified abnormal involuntary movements: Secondary | ICD-10-CM | POA: Diagnosis not present

## 2013-01-07 DIAGNOSIS — J449 Chronic obstructive pulmonary disease, unspecified: Secondary | ICD-10-CM

## 2013-01-07 DIAGNOSIS — R739 Hyperglycemia, unspecified: Secondary | ICD-10-CM

## 2013-01-07 DIAGNOSIS — N400 Enlarged prostate without lower urinary tract symptoms: Secondary | ICD-10-CM | POA: Diagnosis not present

## 2013-01-07 LAB — COMPREHENSIVE METABOLIC PANEL
ALT: 13 U/L (ref 0–53)
AST: 22 U/L (ref 0–37)
Albumin: 3.9 g/dL (ref 3.5–5.2)
Alkaline Phosphatase: 86 U/L (ref 39–117)
Chloride: 104 mEq/L (ref 96–112)
Glucose, Bld: 107 mg/dL — ABNORMAL HIGH (ref 70–99)
Potassium: 3.8 mEq/L (ref 3.5–5.1)
Sodium: 140 mEq/L (ref 135–145)
Total Bilirubin: 0.6 mg/dL (ref 0.3–1.2)
Total Protein: 7.8 g/dL (ref 6.0–8.3)

## 2013-01-07 LAB — VITAMIN B12: Vitamin B-12: 438 pg/mL (ref 211–911)

## 2013-01-07 LAB — CBC WITH DIFFERENTIAL/PLATELET
Basophils Absolute: 0 10*3/uL (ref 0.0–0.1)
Eosinophils Absolute: 0.7 10*3/uL (ref 0.0–0.7)
MCHC: 33.4 g/dL (ref 30.0–36.0)
MCV: 93.7 fl (ref 78.0–100.0)
Monocytes Absolute: 0.5 10*3/uL (ref 0.1–1.0)
Neutrophils Relative %: 62 % (ref 43.0–77.0)
Platelets: 196 10*3/uL (ref 150.0–400.0)
RDW: 13.9 % (ref 11.5–14.6)

## 2013-01-07 LAB — LIPID PANEL
Cholesterol: 137 mg/dL (ref 0–200)
HDL: 31.9 mg/dL — ABNORMAL LOW (ref >39.00)
LDL Cholesterol: 82 mg/dL (ref 0–99)

## 2013-01-07 LAB — HEMOGLOBIN A1C: Hgb A1c MFr Bld: 5.9 % (ref 4.6–6.5)

## 2013-01-07 NOTE — Assessment & Plan Note (Addendum)
Td 2012,  Pneumonia shot 2009,  Shingles immunization --- 2013  colonoscopy in 2007, 04/2008, 03-2010 (next 5 years)  Request a prostate cancer screening  Diet and exercise discussed

## 2013-01-07 NOTE — Progress Notes (Signed)
Subjective:    Patient ID: Brian [name], male    DOB: [birth year], 79 y.o.   MRN: [id number]  HPI  Here for Medicare AWV: 1.         Risk factors based on Past M, S, F history: yes   2.         Physical Activities: active w/yard work-home chores 3.         Depression/mood: neg screening  4.         Hearing: decreased a little, no tinnitus, not affecting ADL  5.         ADL's: totally independent , still drives   6.         Fall Risk: prevention discussed 7.         Home Safety: does feel safe at home   8.         Height, weight, &visual acuity: see VS, vision good w/ correction , last visit w/ eye                     doctor ~ 1 year ago  9.         Counseling: yes , see below   10.       Labs ordered based on risk factors:  yes   11.       Referral Coordination: if needed   12.      Care Plan: see a/p   13.            Cognitive Assessment --motor skills stable , memory and cognition seems normal  In addition, we discussed the following issues Occasional swelling around ankles x 6 months, only at the end of the day. Tremor: Propranolol dose was  Decreased d/t  bradycardia, tremors not worse. Anxiety, self decreased dose of BuSpar and continue to do well emotionally. Coumadin/2 phone followup by cardiology. This year he request a prostate cancer screening. Hemorrhoids--usually well-controlled with  fiber supplementation and use creams as needed   Past Medical History:   Muscle tremor, saw neurology remotely elsewhere, was Rx inderal   A fib w/ RVR   Hypertriglyceridemia   HTN dx 2011   Anxiety   COPD   DJD   H/o peripheral neuropathy with negative workup   B12 DEFICIENCY   Crohn's ileocolitis   Osteopenia per DEXA 9-10   Skin ca [facility]   BPH   ED   Hemorrhoids   Past Surgical History  Procedure Laterality Date  . Resection of distal ileum and cecum w/ appendectomy      18-inch small intestine, 1984 for Crohn's disease  . Colon surgery  1983    has chrohn's  .  Appendectomy  1983  . Cholecystectomy  02/17/2011    Procedure: LAPAROSCOPIC CHOLECYSTECTOMY WITH INTRAOPERATIVE CHOLANGIOGRAM;  Surgeon: Mariella Saa, MD;  Location: [facility];  Service: General;  Laterality: N/A;  . Cataract extraction      bilateral ~ 2007   Family History  Problem Relation Age of Onset  . Crohn's disease Brother   . Breast cancer Mother   . Colon cancer Neg Hx   . Prostate cancer Neg Hx   . Heart attack Father 12  . Dementia Sister   . Diabetes Sister    History   Social History  . Marital Status: Married    Spouse Name: N/A    Number of Children: 4  . Years of Education: N/A   Occupational History  . Retired  Social History Main Topics  . Smoking status: Former Smoker -- 1.00 packs/day for 40 years    Types: Cigarettes, Pipe    Quit date: 03/24/1990  . Smokeless tobacco: Never Used  . Alcohol Use: No     Comment:    . Drug Use: No  . Sexual Activity: Not Currently    Birth Control/ Protection: None   Other Topics Concern  . Not on file   Social History Narrative   Married, lives w/ wife       Review of Systems No  CP, SOB, palpatitations No orthopnea , DOE (only if goes up a hill), able to bike x 30 minutes  Denies  nausea, vomiting diarrhea occ  blood in the stools (h/o hemorrhoids) (-) cough, sputum production, some Post nasal drip (-)hemoptysis No dysuria, gross hematuria, difficulty urinating       Objective:   Physical Exam BP 145/71  Pulse 64  Temp(Src) 98 F (36.7 C)  Ht 6' 0.5" (1.842 m)  Wt 167 lb (75.751 kg)  BMI 22.33 kg/m2  SpO2 99% General -- alert, well-developed, NAD.  Neck --  normal carotid pulse, no LAD Lungs -- normal respiratory effort, no intercostal retractions, no accessory muscle use, ? Dry crackles at bases B Heart-- irreg  Abdomen-- Not distended, good bowel sounds,soft, non-tender. Rectal-- ++ external hemorrhoid. Normal sphincter tone. No rectal masses or tenderness. Brown stools   Prostate--Prostate gland firm and smooth, no enlargement, nodularity, tenderness, mass, asymmetry or induration. Extremities-- trace pretibial edema bilaterally  Neurologic--  alert & oriented X3. Speech normal, + tremor @ baseloine Psych-- Cognition and judgment appear intact. Cooperative with normal attention span and concentration. No anxious appearing , no depressed appearing.      Assessment & Plan:  Edema--Recommend observation Mild hyperglycemia, recheck A1c

## 2013-01-07 NOTE — Assessment & Plan Note (Signed)
Bilateral dry crackles at bases, last chest x-ray last year showed some pulmonary fibrosis, he is essentially doing very well

## 2013-01-07 NOTE — Assessment & Plan Note (Signed)
Occasional nocturia, DRE wnl, pt  request a PSA

## 2013-01-07 NOTE — Assessment & Plan Note (Signed)
Labs

## 2013-01-07 NOTE — Assessment & Plan Note (Signed)
Well-controlled, check a CMP 

## 2013-01-07 NOTE — Patient Instructions (Signed)
Get your blood work before you leave  Next visit in  3-4 months  for a check up    Fall Prevention and Home Safety Falls cause injuries and can affect all age groups. It is possible to use preventive measures to significantly decrease the likelihood of falls. There are many simple measures which can make your home safer and prevent falls. OUTDOORS  Repair cracks and edges of walkways and driveways.  Remove high doorway thresholds.  Trim shrubbery on the main path into your home.  Have good outside lighting.  Clear walkways of tools, rocks, debris, and clutter.  Check that handrails are not broken and are securely fastened. Both sides of steps should have handrails.  Have leaves, snow, and ice cleared regularly.  Use sand or salt on walkways during winter months.  In the garage, clean up grease or oil spills. BATHROOM  Install night lights.  Install grab bars by the toilet and in the tub and shower.  Use non-skid mats or decals in the tub or shower.  Place a plastic non-slip stool in the shower to sit on, if needed.  Keep floors dry and clean up all water on the floor immediately.  Remove soap buildup in the tub or shower on a regular basis.  Secure bath mats with non-slip, double-sided rug tape.  Remove throw rugs and tripping hazards from the floors. BEDROOMS  Install night lights.  Make sure a bedside light is easy to reach.  Do not use oversized bedding.  Keep a telephone by your bedside.  Have a firm chair with side arms to use for getting dressed.  Remove throw rugs and tripping hazards from the floor. KITCHEN  Keep handles on pots and pans turned toward the center of the stove. Use back burners when possible.  Clean up spills quickly and allow time for drying.  Avoid walking on wet floors.  Avoid hot utensils and knives.  Position shelves so they are not too high or low.  Place commonly used objects within easy reach.  If necessary, use a  sturdy step stool with a grab bar when reaching.  Keep electrical cables out of the way.  Do not use floor polish or wax that makes floors slippery. If you must use wax, use non-skid floor wax.  Remove throw rugs and tripping hazards from the floor. STAIRWAYS  Never leave objects on stairs.  Place handrails on both sides of stairways and use them. Fix any loose handrails. Make sure handrails on both sides of the stairways are as long as the stairs.  Check carpeting to make sure it is firmly attached along stairs. Make repairs to worn or loose carpet promptly.  Avoid placing throw rugs at the top or bottom of stairways, or properly secure the rug with carpet tape to prevent slippage. Get rid of throw rugs, if possible.  Have an electrician put in a light switch at the top and bottom of the stairs. OTHER FALL PREVENTION TIPS  Wear low-heel or rubber-soled shoes that are supportive and fit well. Wear closed toe shoes.  When using a stepladder, make sure it is fully opened and both spreaders are firmly locked. Do not climb a closed stepladder.  Add color or contrast paint or tape to grab bars and handrails in your home. Place contrasting color strips on first and last steps.  Learn and use mobility aids as needed. Install an electrical emergency response system.  Turn on lights to avoid dark areas. Replace light bulbs  that burn out immediately. Get light switches that glow.  Arrange furniture to create clear pathways. Keep furniture in the same place.  Firmly attach carpet with non-skid or double-sided tape.  Eliminate uneven floor surfaces.  Select a carpet pattern that does not visually hide the edge of steps.  Be aware of all pets. OTHER HOME SAFETY TIPS  Set the water temperature for 120 F (48.8 C).  Keep emergency numbers on or near the telephone.  Keep smoke detectors on every level of the home and near sleeping areas. Document Released: 02/28/2002 Document Revised:  09/09/2011 Document Reviewed: 05/30/2011 [facility] Patient Information 2014 [place], [place].

## 2013-01-07 NOTE — Assessment & Plan Note (Signed)
Beta blockers dose was decreased due to bradycardia, tremor continued to be well-controlled

## 2013-01-08 ENCOUNTER — Encounter: Payer: Self-pay | Admitting: Internal Medicine

## 2013-01-10 ENCOUNTER — Encounter: Payer: Self-pay | Admitting: *Deleted

## 2013-01-19 ENCOUNTER — Other Ambulatory Visit: Payer: Self-pay | Admitting: *Deleted

## 2013-01-19 MED ORDER — GEMFIBROZIL 600 MG PO TABS: ORAL_TABLET | ORAL | Status: DC

## 2013-01-19 NOTE — Telephone Encounter (Signed)
Gemfibrozil refill sent to pharmacy

## 2013-01-21 ENCOUNTER — Ambulatory Visit (INDEPENDENT_AMBULATORY_CARE_PROVIDER_SITE_OTHER): Payer: Medicare Other | Admitting: *Deleted

## 2013-01-21 DIAGNOSIS — Z7901 Long term (current) use of anticoagulants: Secondary | ICD-10-CM

## 2013-01-21 DIAGNOSIS — I4891 Unspecified atrial fibrillation: Secondary | ICD-10-CM

## 2013-01-21 LAB — POCT INR: INR: 2.9

## 2013-01-24 ENCOUNTER — Ambulatory Visit (INDEPENDENT_AMBULATORY_CARE_PROVIDER_SITE_OTHER): Payer: Medicare Other | Admitting: Cardiology

## 2013-01-24 ENCOUNTER — Encounter: Payer: Self-pay | Admitting: Cardiology

## 2013-01-24 VITALS — BP 140/80 | HR 61 | Ht 72.25 in | Wt 166.0 lb

## 2013-01-24 DIAGNOSIS — I4891 Unspecified atrial fibrillation: Secondary | ICD-10-CM | POA: Diagnosis not present

## 2013-01-24 DIAGNOSIS — E781 Pure hyperglyceridemia: Secondary | ICD-10-CM | POA: Diagnosis not present

## 2013-01-24 DIAGNOSIS — I10 Essential (primary) hypertension: Secondary | ICD-10-CM

## 2013-01-25 NOTE — Progress Notes (Signed)
Patient ID: Brian [name], male   DOB: 1928-05-28, 79 y.o.   MRN: [id number] PCP: Dr. Drue Novel  79 yo with history of chronic atrial fibrillation and HTN presents for cardiology followup.  He has been seen by Dr. Daleen Squibb in the past and is seen by me for the first time today.  I reviewed all his old records. Patient has had atrial fibrillation for a number of years since a gallstone pancreatitis episode. He is on warfarin and has had no problems with it.  He is active in general with no dyspnea walking on flat ground.  He goes 3 times a week to a gym and rides an exercise bike for about 30 minutes.  He can walk up a flight of steps without problems.  No chest pain.  He has occasional mild ankle swelling.  He has had no stroke-like symptoms.    ECG: atrial fibrillation with right axis deviation  Labs (10/14): K 3.8, creatinine 1.0, LDL 82, HDL 32, TGs 114  PMH: 1. Chronic atrial fibrillation: Began with gallstone pancreatitis episode. Echo (11/12) with EF 55-60%, mild MR, moderate biatrial enlargement.  2. HTN 3. Cholecystectomy after gallstone pancreatitis 4. Crohns Disease: Relatively quiescent. 5. Tremor: on propranolol since 1969.  6. BPH 7. OA 8. COPD 9. Skin cancer 10. Hyperlipidemia  SH: Married, 4 kids, retired, prior smoker but quit 1992.   FH: Father with MI at 22  ROS: All systems reviewed and negative except as per HPI.   Current Outpatient Prescriptions  Medication Sig Dispense Refill  . acetaminophen (TYLENOL) 325 MG tablet Take 650 mg by mouth as needed for pain.      Marland Kitchen amLODipine (NORVASC) 5 MG tablet TAKE 1 TABLET DAILY  90 tablet  0  . busPIRone (BUSPAR) 15 MG tablet Take by mouth. 1 tab in AM, 1/2 tab PM      . Calcium Carbonate-Vitamin D (CALTRATE 600+D PO) Take 1 tablet by mouth daily.       . clobetasol (TEMOVATE) 0.05 % cream APPLY TO KNEES TWICE A DAY AS NEEDED  30 g  0  . Cyanocobalamin (NASCOBAL) 500 MCG/0.1ML SOLN 1 spray to nostril once weekly      . gemfibrozil  (LOPID) 600 MG tablet TAKE 1 TABLET TWICE A DAY  180 tablet  1  . mesalamine (PENTASA) 250 MG CR capsule TAKE 2 CAPSULES TWICE A DAY  360 capsule  3  . propranolol (INDERAL) 80 MG tablet TAKE 1/2  TABLET 3 times a day      . psyllium (METAMUCIL) 58.6 % powder 1 tbs daily       . warfarin (COUMADIN) 2 MG tablet TAKE 1 TABLET AS DIRECTED  90 tablet  1   No current facility-administered medications for this visit.    BP 140/80  Pulse 61  Ht 6' 0.25" (1.835 m)  Wt 75.297 kg (166 lb)  BMI 22.36 kg/m2  SpO2 98% General: NAD Neck: No JVD, no thyromegaly or thyroid nodule.  Lungs: Clear to auscultation bilaterally with normal respiratory effort. CV: Nondisplaced PMI.  Heart regular S1/S2, no S3/S4, no murmur.  Trace ankle edema.  No carotid bruit.  Normal pedal pulses.  Abdomen: Soft, nontender, no hepatosplenomegaly, no distention.  Skin: Intact without lesions or rashes.  Neurologic: Alert and oriented x 3.  Psych: Normal affect. Extremities: No clubbing or cyanosis.   Assessment/Plan: 1. Atrial fibrillation: Chronic.  He really does not seem to have symptoms from his atrial fibrillation.  I mentioned NOACs  to him but he has had no problems with warfarin and wants to continue it.  He is on propranolol (has been on it since 1969) for his tremor and his rate control is reasonable so I will not change this.  2. HTN: Reasonable control on amlodipine.  3. Hyperlipidemia: Good LDL in 10/14.  Triglycerides controlled with gemfibrozil.  Apparently they were very high at one point.   Marca Ancona 01/25/2013 6:49 AM

## 2013-02-24 ENCOUNTER — Other Ambulatory Visit: Payer: Self-pay | Admitting: Gastroenterology

## 2013-02-24 NOTE — Telephone Encounter (Signed)
NEEDS OFFICE VISIT FOR ANY FURTHER REFILLS! 

## 2013-02-26 ENCOUNTER — Other Ambulatory Visit: Payer: Self-pay | Admitting: Internal Medicine

## 2013-02-28 NOTE — Telephone Encounter (Signed)
rx refilled per protocol. DJR  

## 2013-03-04 ENCOUNTER — Ambulatory Visit (INDEPENDENT_AMBULATORY_CARE_PROVIDER_SITE_OTHER): Payer: Medicare Other | Admitting: Pharmacist

## 2013-03-04 DIAGNOSIS — I4891 Unspecified atrial fibrillation: Secondary | ICD-10-CM

## 2013-03-04 DIAGNOSIS — Z7901 Long term (current) use of anticoagulants: Secondary | ICD-10-CM | POA: Diagnosis not present

## 2013-03-04 LAB — POCT INR: INR: 2.3

## 2013-03-05 ENCOUNTER — Other Ambulatory Visit: Payer: Self-pay | Admitting: Internal Medicine

## 2013-03-07 NOTE — Telephone Encounter (Signed)
rx refilled per protocol. DJR  

## 2013-03-24 DIAGNOSIS — J8410 Pulmonary fibrosis, unspecified: Secondary | ICD-10-CM

## 2013-03-24 HISTORY — DX: Pulmonary fibrosis, unspecified: J84.10

## 2013-04-04 ENCOUNTER — Other Ambulatory Visit: Payer: Self-pay | Admitting: Internal Medicine

## 2013-04-04 NOTE — Telephone Encounter (Signed)
Nascobal refilled per protocol. JG//CMA

## 2013-04-15 ENCOUNTER — Ambulatory Visit (INDEPENDENT_AMBULATORY_CARE_PROVIDER_SITE_OTHER): Payer: Medicare Other | Admitting: Pharmacist

## 2013-04-15 DIAGNOSIS — I4891 Unspecified atrial fibrillation: Secondary | ICD-10-CM

## 2013-04-15 DIAGNOSIS — Z7901 Long term (current) use of anticoagulants: Secondary | ICD-10-CM | POA: Diagnosis not present

## 2013-04-15 LAB — POCT INR: INR: 2.4

## 2013-05-10 ENCOUNTER — Ambulatory Visit: Payer: Medicare Other | Admitting: Internal Medicine

## 2013-05-12 ENCOUNTER — Ambulatory Visit (INDEPENDENT_AMBULATORY_CARE_PROVIDER_SITE_OTHER)
Admission: RE | Admit: 2013-05-12 | Discharge: 2013-05-12 | Disposition: A | Discharge status: Home / Self Care | Payer: Medicare Other | Source: Ambulatory Visit | Attending: Adult Health | Admitting: Adult Health

## 2013-05-12 ENCOUNTER — Ambulatory Visit (INDEPENDENT_AMBULATORY_CARE_PROVIDER_SITE_OTHER): Payer: Medicare Other | Admitting: Adult Health

## 2013-05-12 ENCOUNTER — Encounter: Payer: Self-pay | Admitting: Adult Health

## 2013-05-12 VITALS — BP 120/66 | HR 51 | Temp 97.6°F | Ht 70.25 in | Wt 160.2 lb

## 2013-05-12 DIAGNOSIS — R0609 Other forms of dyspnea: Secondary | ICD-10-CM

## 2013-05-12 DIAGNOSIS — J449 Chronic obstructive pulmonary disease, unspecified: Secondary | ICD-10-CM | POA: Diagnosis not present

## 2013-05-12 DIAGNOSIS — R0989 Other specified symptoms and signs involving the circulatory and respiratory systems: Secondary | ICD-10-CM

## 2013-05-12 DIAGNOSIS — R0600 Dyspnea, unspecified: Secondary | ICD-10-CM

## 2013-05-12 DIAGNOSIS — J984 Other disorders of lung: Secondary | ICD-10-CM | POA: Diagnosis not present

## 2013-05-12 NOTE — Progress Notes (Signed)
  Subjective:    Patient ID: Brian [name], male    DOB: 10-12-1928.   MRN: [id number]  HPI 82yowm quit smoking 1990  with dx of copd by screening pft's but he was not aware of any limitations until around 2006 with exertion minimally worse since then referred 01/07/2011 by Dr Larose Kells for sob but only very mild copd by pfts 06/09/2011   01/07/2011 [name]/  Initial pulmonary office eval cc doe x exertion x 5 years, not progressing, not bothering sleep, no am cough or tendency to flares despite longterm maint on inderal for tremors.  Imp mild copd/ pf  rec  No f/u unless symptoms warrant    GB/ pancreatitis > lap chole 02/17/11 [facility] with abn ct abd  11/23 so referred back to pulmonary clinic   02/28/2011 f/u ov/[name] cc still hoarse, denies limiting doe, cc throat drainage and freq throat clearing in retrospect present for years, no worse lately. No purulent sputum or overt HB, no h/o seasonal rhinitis or exac with colds or eating.  rec [name] diet F/u with pfts and cxr   06/09/2011 f/u ov/[name] cc nose dripping and throat clearing x since summer 2012 day >> night breathing is fine much better overall with no limit from desired activity.    05/12/2013 Acute OV  Last seen in office 2013 - Complains of increased SOB w/ exertion and some orthopnea, head congestion w/ PND x1 month.  Progressive DOE x 1 week.  Denies any wheezing, chest tightness, cough, hemoptysis or fever. Per Does have, on and off Hoarseness , drainage, , no discolored mucus or fever.  On Coumadin for Atrial fib.  Hx of Chron's Dz- on Pentasa Feels his breathing has been getting worse over last 1 year.  Desats with walking 85% w/ quick rebound in low 90s     ROS  At present neg for  any significant sore throat, dysphagia, itching, sneezing,   excess/ purulent nasal secretions,  fever, chills, sweats, unintended wt loss, pleuritic or exertional cp, hempoptysis, orthopnea pnd or leg swelling.  Also denies presyncope, palpitations,  heartburn, abdominal pain, nausea, vomiting, diarrhea  or change in bowel or urinary habits, dysuria,hematuria,  rash, arthralgias, visual complaints, headache, numbness weakness or ataxia.           Objective:   Physical Exam 178  01/07/11  >   172   02/28/2011 > 06/09/2011  177> 160 05/12/13  Edentulous wm nad minimally hoarse HEENT mild turbinate edema.  Oropharynx no thrush or excess pnd or cobblestoning.  No JVD or cervical adenopathy. Mild accessory muscle hypertrophy. Trachea midline, nl thryroid. Chest was hyperinflated by percussion with diminished breath sounds and moderate increased exp time without wheeze. Hoover sign positive at mid inspiration. Regular rate and rhythm without murmur gallop or rub or increase P2 or edema.  Abd: no hsm, nl excursion. Ext warm without cyanosis  clubbing.        CXR  06/09/2011 : Enlargement of cardiac silhouette.  Emphysematous and minimal bronchitic changes consistent with COPD.  Pulmonary fibrosis, greatest at left base.  No acute abnormalities.          Assessment & Plan:  \

## 2013-05-12 NOTE — Patient Instructions (Addendum)
Saline nasal rinses As needed   Begin Allegra 180mg  daily for drainage  Chest xray today.  Begin Breo 1 puff daily , rinse after use.  Follow up Dr. Melvyn Novas  In 2 weeks and As needed   Please contact office for sooner follow up if symptoms do not improve or worsen or seek emergency care    Late Add :  ? Left basilar PNA >add Levaquin x 7 d ,  Check cxr on return ov

## 2013-05-13 ENCOUNTER — Telehealth: Payer: Self-pay | Admitting: Internal Medicine

## 2013-05-13 ENCOUNTER — Other Ambulatory Visit: Payer: Self-pay | Admitting: Adult Health

## 2013-05-13 MED ORDER — LEVOFLOXACIN 500 MG PO TABS: 500.0000 mg | ORAL_TABLET | Freq: Every day | ORAL | Status: DC

## 2013-05-13 NOTE — Progress Notes (Signed)
Result Notes    Notes Recorded by Rinaldo Ratel, CMA on 05/13/2013 at 2:33 PM Called spoke with patient, advised of cxr results / recs as stated by TP. Pt verbalized his understanding and denied any questions. Orders only encounter created for Levaquin rx. Pt is aware to take med w/ food, to contact the coumadin clinic regarding abx therapy and to arrive early for cxr at 3.5.15 ov w/ MW. ------  Notes Recorded by Melvenia Needles, NP on 05/13/2013 at 2:20 PM ? PNA in left base vs progressive fibrosis  Will begin Levaquin 500mg  daily # 7 , 1 po daily  No refills  Will need repeat cxr in 2 weeks on return ov. With Dr. Melvyn Novas  Pt to [facility] that abx is started.  Take abx w/ food  Please contact office for sooner follow up if symptoms do not improve or worsen or seek emergency care

## 2013-05-13 NOTE — Progress Notes (Signed)
Quick Note:  Called spoke with patient, advised of cxr results / recs as stated by TP. Pt verbalized his understanding and denied any questions. Orders only encounter created for Levaquin rx. Pt is aware to take med w/ food, to contact the coumadin clinic regarding abx therapy and to arrive early for cxr at 3.5.15 ov w/ MW. ______

## 2013-05-13 NOTE — Telephone Encounter (Signed)
Pt aware per 2.20.15 result note

## 2013-05-13 NOTE — Assessment & Plan Note (Signed)
Mild flare  Vs dz progression  Will need repeat spirometry on return  Check cxr today , bnp and ESR   Plan  Saline nasal rinses As needed   Begin Allegra 180mg daily for drainage  Chest xray today.  Begin Breo 1 puff daily , rinse after use.  Follow up Dr. [name]  In 2 weeks and As needed   Please contact office for sooner follow up if symptoms do not improve or worsen or seek emergency care    

## 2013-05-16 ENCOUNTER — Other Ambulatory Visit (INDEPENDENT_AMBULATORY_CARE_PROVIDER_SITE_OTHER): Payer: Medicare Other

## 2013-05-16 DIAGNOSIS — R0600 Dyspnea, unspecified: Secondary | ICD-10-CM

## 2013-05-16 DIAGNOSIS — R0609 Other forms of dyspnea: Secondary | ICD-10-CM

## 2013-05-16 DIAGNOSIS — R0989 Other specified symptoms and signs involving the circulatory and respiratory systems: Secondary | ICD-10-CM | POA: Diagnosis not present

## 2013-05-16 LAB — SEDIMENTATION RATE: SED RATE: 17 mm/h (ref 0–22)

## 2013-05-16 LAB — BRAIN NATRIURETIC PEPTIDE: Pro B Natriuretic peptide (BNP): 131 pg/mL — ABNORMAL HIGH (ref 0.0–100.0)

## 2013-05-20 ENCOUNTER — Encounter: Payer: Self-pay | Admitting: Internal Medicine

## 2013-05-20 ENCOUNTER — Ambulatory Visit (INDEPENDENT_AMBULATORY_CARE_PROVIDER_SITE_OTHER): Payer: Medicare Other | Admitting: Internal Medicine

## 2013-05-20 VITALS — BP 145/76 | HR 72 | Temp 97.9°F | Ht 71.5 in | Wt 157.0 lb

## 2013-05-20 DIAGNOSIS — F411 Generalized anxiety disorder: Secondary | ICD-10-CM

## 2013-05-20 MED ORDER — CLONAZEPAM 0.5 MG PO TABS: 0.5000 mg | ORAL_TABLET | Freq: Three times a day (TID) | ORAL | Status: DC | PRN

## 2013-05-20 NOTE — Progress Notes (Signed)
Pre visit review using our clinic review tool, if applicable. No additional management support is needed unless otherwise documented below in the visit note. 

## 2013-05-20 NOTE — Progress Notes (Signed)
Quick Note:  LMOM TCB x1. ______ 

## 2013-05-20 NOTE — Progress Notes (Signed)
Subjective:    Patient ID: Brian [name], male    DOB: 1929-02-19, 80 y.o.   MRN: [id number]  DOS:  05/20/2013 Reason for visit:  Anxiety One-month history of increased dyspnea on exertion, he was seen by pulmonology approximately 05/12/2013, diagnosed with pneumonia, status post Levaquin, taking Allegra and Breo Ellipta. He is very anxious about the fact that he had some dyspnea on exertion and the diagnosis of pneumonia.   ROS Admits to some depression. Also difficulty sleeping. No suicidal ideas. Never had fever or chills. Dyspnea on exertion slightly better now. No chest pain or cough.   Past Medical History  Diagnosis Date  . Muscle tremor     saw neurology remotely elsewhere, was Rx inderal  . Crohn's ileocolitis   . Osteopenia     per DEXA 11/2008  . Hypertension 11/11    mild  . Hemorrhoids   . Hypertrophy of prostate     w/o UR obst & oth luts  . Peripheral neuropathy     h/o neg w/u  . Osteoarthritis     ankle,right  . ED (erectile dysfunction)   . Hypertriglyceridemia   . B12 deficiency   . COPD (chronic obstructive pulmonary disease)     recent dx--no acute problems  . Cancer     squamous cell skin cancers-tx'd q4yrs  . Hiatal hernia 1956    found while in service  . Anxiety   . Atrial fibrillation with RVR   . Long term (current) use of anticoagulants 02/21/2011  . Serrated adenoma of colon 03/2010    Past Surgical History  Procedure Laterality Date  . Resection of distal ileum and cecum w/ appendectomy      18-inch small intestine, 1984 for Crohn's disease  . Colon surgery  1983    has chrohn's  . Appendectomy  1983  . Cholecystectomy  02/17/2011    Procedure: LAPAROSCOPIC CHOLECYSTECTOMY WITH INTRAOPERATIVE CHOLANGIOGRAM;  Surgeon: Edward Jolly, MD;  Location: [facility];  Service: General;  Laterality: N/A;  . Cataract extraction      bilateral ~ 2007    History   Social History  . Marital Status: Married    Spouse Name: N/A   Number of Children: 4  . Years of Education: N/A   Occupational History  . Retired    Social History Main Topics  . Smoking status: Former Smoker -- 1.00 packs/day for 40 years    Types: Cigarettes, Pipe    Quit date: 03/24/1990  . Smokeless tobacco: Never Used  . Alcohol Use: No     Comment:    . Drug Use: No  . Sexual Activity: Not Currently    Birth Control/ Protection: None   Other Topics Concern  . Not on file   Social History Narrative   Married, lives w/ wife         Medication List       This list is accurate as of: 05/20/13 11:59 PM.  Always use your most recent med list.               acetaminophen 325 MG tablet  Commonly known as:  TYLENOL  Take 650 mg by mouth as needed for pain.     amLODipine 5 MG tablet  Commonly known as:  NORVASC  TAKE 1 TABLET DAILY (OFFICE VISIT BEFORE REFILLS)     busPIRone 15 MG tablet  Commonly known as:  BUSPAR  TAKE 1 TABLET TWICE A DAY  CALTRATE 600+D PO  Take 1 tablet by mouth daily.     clonazePAM 0.5 MG tablet  Commonly known as:  KLONOPIN  Take 1 tablet (0.5 mg total) by mouth 3 (three) times daily as needed for anxiety.     gemfibrozil 600 MG tablet  Commonly known as:  LOPID  TAKE 1 TABLET TWICE A DAY     NASCOBAL 500 MCG/0.1ML Soln  Generic drug:  Cyanocobalamin  USE ONE SPRAY NASALLY AS DIRECTED PER WEEK     PENTASA 250 MG CR capsule  Generic drug:  mesalamine  TAKE 2 CAPSULES TWICE A DAY     propranolol 80 MG tablet  Commonly known as:  INDERAL  TAKE 1/2  TABLET 3 times a day     psyllium 58.6 % powder  Commonly known as:  METAMUCIL  1 tbs daily     warfarin 2 MG tablet  Commonly known as:  COUMADIN  TAKE 1 TABLET AS DIRECTED           Objective:   Physical Exam BP 145/76  Pulse 72  Temp(Src) 97.9 F (36.6 C)  Ht 5' 11.5" (1.816 m)  Wt 157 lb (71.215 kg)  BMI 21.59 kg/m2  SpO2 90% General -- alert, well-developed, NAD.  Lungs -- normal respiratory effort, no intercostal  retractions, no accessory muscle use, Bilateral basal dry crackles Heart-- irreg Extremities-- no pretibial edema bilaterally  Neurologic--  alert & oriented X3. Speech normal, gait normal, strength normal in all extremities.  Psych-- Cognition and judgment appear intact. Cooperative with normal attention span and concentration. slt anxious , no depressed appearing.     Assessment & Plan:   Today , I spent more than 25  min with the patient, >50% of the time counseling

## 2013-05-20 NOTE — Assessment & Plan Note (Addendum)
Long history of anxiety, usually well control with BuSpar. Symptoms exacerbated since the diagnosis of pneumonia recently ; also has developed depression. PHQ 9 ----> score 16 which is moderate to severe depression. We discussed different options including observation, counseling, benzodiazepines and SSRIs. His open to counseling Does not desire SSRIs but in the remote past valium helped well. Plan:  See a counselor if so desired Clonazepam Watch for excessive somnolence Followup in 4 weeks

## 2013-05-20 NOTE — Patient Instructions (Signed)
We have 2 great counselors in the office Almyra Free and Coralyn Mark  Add  clonazepam, take it 2 or 3 times a day as needed only, watch for excessive somnolence.  Next visit in 4 weeks

## 2013-05-21 ENCOUNTER — Encounter: Payer: Self-pay | Admitting: Internal Medicine

## 2013-05-23 ENCOUNTER — Other Ambulatory Visit: Payer: Self-pay | Admitting: Internal Medicine

## 2013-05-25 ENCOUNTER — Other Ambulatory Visit: Payer: Self-pay | Admitting: Gastroenterology

## 2013-05-26 ENCOUNTER — Ambulatory Visit (INDEPENDENT_AMBULATORY_CARE_PROVIDER_SITE_OTHER)
Admission: RE | Admit: 2013-05-26 | Discharge: 2013-05-26 | Disposition: A | Discharge status: Home / Self Care | Payer: Medicare Other | Source: Ambulatory Visit | Attending: Internal Medicine | Admitting: Internal Medicine

## 2013-05-26 ENCOUNTER — Encounter: Payer: Self-pay | Admitting: Internal Medicine

## 2013-05-26 ENCOUNTER — Ambulatory Visit (INDEPENDENT_AMBULATORY_CARE_PROVIDER_SITE_OTHER): Payer: Medicare Other | Admitting: Internal Medicine

## 2013-05-26 VITALS — BP 118/62 | HR 62 | Temp 98.0°F | Ht 72.0 in | Wt 158.0 lb

## 2013-05-26 DIAGNOSIS — R918 Other nonspecific abnormal finding of lung field: Secondary | ICD-10-CM

## 2013-05-26 DIAGNOSIS — J449 Chronic obstructive pulmonary disease, unspecified: Secondary | ICD-10-CM | POA: Diagnosis not present

## 2013-05-26 DIAGNOSIS — J8410 Pulmonary fibrosis, unspecified: Secondary | ICD-10-CM

## 2013-05-26 NOTE — Progress Notes (Signed)
Subjective:   Patient ID: Brian [name], male    DOB: 12/16/1928.   MRN: [id number]   Brief patient profile:  84yowm quit smoking 1990  with dx of copd by screening pft's but he was not aware of any limitations until around 2006 with exertion minimally worse since then referred 01/07/2011 by Dr Larose Kells for sob but only very mild copd by pfts 06/09/2011    History of Present Illness  01/07/2011 Brian [name]/  Initial pulmonary office eval cc doe x exertion x 5 years, not progressing, not bothering sleep, no am cough or tendency to flares despite longterm maint on inderal for tremors.  Imp mild copd/ pf  rec  No f/u unless symptoms warrant    GB/ pancreatitis > lap chole 02/17/11 [facility] with abn ct abd  11/23 with fibrotic changes both bases so referred back to pulmonary clinic   02/28/2011 f/u ov/Brian [name] cc still hoarse, denies limiting doe, cc throat drainage and freq throat clearing in retrospect present for years, no worse lately. No purulent sputum or overt HB, no h/o seasonal rhinitis or exac with colds or eating.  rec [name] diet F/u with pfts and cxr > did not follow     05/12/2013 Acute OV  Last seen in office 2013 - Complains of increased SOB w/ exertion and some orthopnea, head congestion w/ PND x1 month.  Progressive DOE x 1 week.  Denies any wheezing, chest tightness, cough, hemoptysis or fever. Per Does have, on and off Hoarseness , drainage, , no discolored mucus or fever.  On Coumadin for Atrial fib.  Hx of Chron's Dz- on Pentasa Feels his breathing has been getting worse over last 1 year.  Desats with walking 85% w/ quick rebound in low 90s  rec Saline nasal rinses As needed   Begin Allegra 180mg  daily for drainage  Chest xray today.  Begin Breo 1 puff daily , rinse after use.  Follow up Dr. Melvyn Novas  In 2 weeks and As needed    ? Left basilar PNA >add Levaquin x 7 d    05/26/2013 f/u ov/Brian [name] re: copd/f/u LLL pna  Chief Complaint  Patient presents with  . Follow-up    Pt states  that his breathing is much improved since last visit. Minimal dry cough for the past 2 days.   Not limited by breathing from desired activities   Better breathing on breo but now daytime dry cough. Still on relatively high dose inderal   No obvious day to day or daytime variabilty or assoc  cp or chest tightness, subjective wheeze overt sinus or hb symptoms. No unusual exp hx or h/o childhood pna/ asthma or knowledge of premature birth.  Sleeping ok without nocturnal  or early am exacerbation  of respiratory  c/o's or need for noct saba. Also denies any obvious fluctuation of symptoms with weather or environmental changes or other aggravating or alleviating factors except as outlined above   Current Medications, Allergies, Complete Past Medical History, Past Surgical History, Family History, and Social History were reviewed in [employer] record.  ROS  The following are not active complaints unless bolded sore throat, dysphagia, dental problems, itching, sneezing,  nasal congestion or excess/ purulent secretions, ear ache,   fever, chills, sweats, unintended wt loss, pleuritic or exertional cp, hemoptysis,  orthopnea pnd or leg swelling, presyncope, palpitations, heartburn, abdominal pain, anorexia, nausea, vomiting, diarrhea  or change in bowel or urinary habits, change in stools or urine, dysuria,hematuria,  rash, arthralgias, visual complaints,  headache, numbness weakness or ataxia or problems with walking or coordination,  change in mood/affect or memory.                   Objective:   Physical Exam 178  01/07/11  >   172   02/28/2011 > 06/09/2011  177> 160 05/12/13 > 05/26/2013 158   Edentulous wm nad minimally hoarse  HEENT mild turbinate edema.  Oropharynx no thrush or excess pnd or cobblestoning.  No JVD or cervical adenopathy. Mild accessory muscle hypertrophy. Trachea midline, nl thryroid. Chest was hyperinflated by percussion with diminished breath sounds and  moderate increased exp time without wheeze. Hoover sign positive at mid inspiration. Regular rate and rhythm without murmur gallop or rub or increase P2 or edema.  Abd: no hsm, nl excursion. Ext warm without cyanosis  clubbing.       CXR  05/26/2013 :   Prominent interstitial markings remain stable at both lung bases, possibly representing pulmonary fibrosis          Assessment & Plan:  \

## 2013-05-26 NOTE — Patient Instructions (Addendum)
Try off breo  You may need to have your inderal adjusted down or off in the future for low pulse or worse breathing  You need to pace yourself at a lower level when you exercise to prevent oxygen levels from dropping too low   Please schedule a follow up office visit in 4 weeks, sooner if needed with pfts

## 2013-05-26 NOTE — Assessment & Plan Note (Signed)
No macroscopic nodules/ f/u conservatively at age 80

## 2013-05-26 NOTE — Assessment & Plan Note (Signed)
-   Spirometry 09/03/10  FEV 1  1.85 (56%) ratio 70     -  PFT's 06/09/2011 FEV1 2.05 (73%)  with ratio 67% and DLCO 64 corrects to 91     - trial off breo 05/26/2013 due to dry cough   Concerned about use of inderal in this setting and may need to consider more selective BB

## 2013-05-26 NOTE — Assessment & Plan Note (Signed)
First noted CT Abd 02/14/11 - 05/26/2013  Walked RA x 3 laps @ 185 ft each stopped due to  End of study desat to 83%   Longstanding based on finding he can't tell when he's desaturating and thinks he's back to his baseline following ? CAP  Needs f/u pfts, serial walking sats for multiple points on the curve

## 2013-05-27 ENCOUNTER — Ambulatory Visit (INDEPENDENT_AMBULATORY_CARE_PROVIDER_SITE_OTHER): Payer: Medicare Other

## 2013-05-27 DIAGNOSIS — I4891 Unspecified atrial fibrillation: Secondary | ICD-10-CM

## 2013-05-27 DIAGNOSIS — Z7901 Long term (current) use of anticoagulants: Secondary | ICD-10-CM | POA: Diagnosis not present

## 2013-05-27 LAB — POCT INR: INR: 1.7

## 2013-05-31 ENCOUNTER — Telehealth: Payer: Self-pay | Admitting: Gastroenterology

## 2013-05-31 MED ORDER — MESALAMINE ER 250 MG PO CPCR: 500.0000 mg | ORAL_CAPSULE | Freq: Two times a day (BID) | ORAL | Status: DC

## 2013-05-31 NOTE — Telephone Encounter (Signed)
Informed patient he is overdue for office visit and needs to schedule a follow up visit with Dr. Fuller Plan to get further refills. Pt scheduled for 06/27/13 and Pentasa sent to [facility].

## 2013-06-02 ENCOUNTER — Encounter: Payer: Self-pay | Admitting: Lab

## 2013-06-02 ENCOUNTER — Ambulatory Visit (INDEPENDENT_AMBULATORY_CARE_PROVIDER_SITE_OTHER): Payer: Medicare Other | Admitting: Internal Medicine

## 2013-06-02 VITALS — BP 118/68 | HR 70 | Temp 97.9°F | Wt 156.0 lb

## 2013-06-02 DIAGNOSIS — R5381 Other malaise: Secondary | ICD-10-CM

## 2013-06-02 DIAGNOSIS — R5383 Other fatigue: Secondary | ICD-10-CM | POA: Diagnosis not present

## 2013-06-02 DIAGNOSIS — R259 Unspecified abnormal involuntary movements: Secondary | ICD-10-CM

## 2013-06-02 DIAGNOSIS — J8410 Pulmonary fibrosis, unspecified: Secondary | ICD-10-CM

## 2013-06-02 DIAGNOSIS — R0902 Hypoxemia: Secondary | ICD-10-CM

## 2013-06-02 LAB — CBC WITH DIFFERENTIAL/PLATELET
Basophils Absolute: 0 10*3/uL (ref 0.0–0.1)
Basophils Relative: 0 % (ref 0.0–3.0)
EOS PCT: 0.9 % (ref 0.0–5.0)
Eosinophils Absolute: 0.1 10*3/uL (ref 0.0–0.7)
HEMATOCRIT: 46.5 % (ref 39.0–52.0)
Hemoglobin: 15.4 g/dL (ref 13.0–17.0)
Lymphocytes Relative: 10.7 % — ABNORMAL LOW (ref 12.0–46.0)
Lymphs Abs: 1.4 10*3/uL (ref 0.7–4.0)
MCHC: 33.2 g/dL (ref 30.0–36.0)
MCV: 94.8 fl (ref 78.0–100.0)
MONO ABS: 0.6 10*3/uL (ref 0.1–1.0)
Monocytes Relative: 4.5 % (ref 3.0–12.0)
NEUTROS PCT: 83.9 % — AB (ref 43.0–77.0)
Neutro Abs: 11 10*3/uL — ABNORMAL HIGH (ref 1.4–7.7)
Platelets: 219 10*3/uL (ref 150.0–400.0)
RBC: 4.91 Mil/uL (ref 4.22–5.81)
RDW: 13.8 % (ref 11.5–14.6)
WBC: 13.2 10*3/uL — AB (ref 4.5–10.5)

## 2013-06-02 LAB — BASIC METABOLIC PANEL
BUN: 16 mg/dL (ref 6–23)
CO2: 26 meq/L (ref 19–32)
Calcium: 9.3 mg/dL (ref 8.4–10.5)
Chloride: 104 mEq/L (ref 96–112)
Creatinine, Ser: 1 mg/dL (ref 0.4–1.5)
GFR: 73.31 mL/min (ref >60.00)
GLUCOSE: 84 mg/dL (ref 70–99)
POTASSIUM: 3.7 meq/L (ref 3.5–5.1)
SODIUM: 138 meq/L (ref 135–145)

## 2013-06-02 MED ORDER — METOPROLOL TARTRATE 50 MG PO TABS: 50.0000 mg | ORAL_TABLET | Freq: Two times a day (BID) | ORAL | Status: DC

## 2013-06-02 NOTE — Patient Instructions (Addendum)
Get your blood work before you leave   Start metoprolol, stop Inderal  Check the  blood pressure 2 or 3 times a  week be sure it is between 110/60 and 140/85. Ideal blood pressure is 120/80. If it is consistently higher or lower, let me know  Symbicort 2 puffs twice a day, see if that helps, call for a refill if needed  Next visit is for routine check up in 4 weeks  No need to come back fasting Please make an appointment

## 2013-06-02 NOTE — Progress Notes (Signed)
Subjective:    Patient ID: Brian [name], male    DOB: [birth year], 80 y.o.   MRN: [id number]  DOS:  06/02/2013 Type of  visit: Acute visit , here with his wife "I'm not bouncing back since the  pneumonia " States dizziness has improved but in general he's not feeling back to baseline, still very tired. Would like to change inderal to something else as suggested  By pulmonary. He is aware that inderal helps his tremors. Anxiety, on clonazepam half tablet twice a day, it does help significantly, no excessive somnolence.    ROS No chest pain. For the past 2 days has not been some cough, chest congestion but no sputum production. No lower extremity edema No fever or chills Rarely has  GERD symptoms. No wheezing per se  Past Medical History  Diagnosis Date  . Muscle tremor     saw neurology remotely elsewhere, was Rx inderal  . Crohn's ileocolitis   . Osteopenia     per DEXA 11/2008  . Hypertension 11/11    mild  . Hemorrhoids   . Hypertrophy of prostate     w/o UR obst & oth luts  . Peripheral neuropathy     h/o neg w/u  . Osteoarthritis     ankle,right  . ED (erectile dysfunction)   . Hypertriglyceridemia   . B12 deficiency   . COPD (chronic obstructive pulmonary disease)     recent dx--no acute problems  . Cancer     squamous cell skin cancers-tx'd q66yrs  . Hiatal hernia 1956    found while in service  . Anxiety   . Atrial fibrillation with RVR   . Long term (current) use of anticoagulants 02/21/2011  . Serrated adenoma of colon 03/2010    Past Surgical History  Procedure Laterality Date  . Resection of distal ileum and cecum w/ appendectomy      18-inch small intestine, 1984 for Crohn's disease  . Colon surgery  1983    has chrohn's  . Appendectomy  1983  . Cholecystectomy  02/17/2011    Procedure: LAPAROSCOPIC CHOLECYSTECTOMY WITH INTRAOPERATIVE CHOLANGIOGRAM;  Surgeon: Edward Jolly, MD;  Location: [facility];  Service: General;  Laterality: N/A;  .  Cataract extraction      bilateral ~ 2007    History   Social History  . Marital Status: Married    Spouse Name: N/A    Number of Children: 4  . Years of Education: N/A   Occupational History  . Retired    Social History Main Topics  . Smoking status: Former Smoker -- 1.00 packs/day for 40 years    Types: Cigarettes, Pipe    Quit date: 03/24/1990  . Smokeless tobacco: Never Used  . Alcohol Use: No     Comment:    . Drug Use: No  . Sexual Activity: Not Currently    Birth Control/ Protection: None   Other Topics Concern  . Not on file   Social History Narrative   Married, lives w/ wife         Medication List       This list is accurate as of: 06/02/13 11:59 PM.  Always use your most recent med list.               acetaminophen 325 MG tablet  Commonly known as:  TYLENOL  Take 650 mg by mouth as needed for pain.     amLODipine 5 MG tablet  Commonly known as:  NORVASC  TAKE 1 TABLET DAILY (OFFICE VISIT BEFORE REFILLS)     busPIRone 15 MG tablet  Commonly known as:  BUSPAR  TAKE 1 TABLET TWICE A DAY     CALTRATE 600+D PO  Take 1 tablet by mouth daily.     clonazePAM 0.5 MG tablet  Commonly known as:  KLONOPIN  Take 1 tablet (0.5 mg total) by mouth 3 (three) times daily as needed for anxiety.     fexofenadine 180 MG tablet  Commonly known as:  ALLEGRA  Take 180 mg by mouth daily.     gemfibrozil 600 MG tablet  Commonly known as:  LOPID  TAKE 1 TABLET TWICE A DAY     mesalamine 250 MG CR capsule  Commonly known as:  PENTASA  Take 2 capsules (500 mg total) by mouth 2 (two) times daily.     metoprolol 50 MG tablet  Commonly known as:  LOPRESSOR  Take 1 tablet (50 mg total) by mouth 2 (two) times daily.     NASCOBAL 500 MCG/0.1ML Soln  Generic drug:  Cyanocobalamin  USE ONE SPRAY NASALLY AS DIRECTED PER WEEK     psyllium 58.6 % powder  Commonly known as:  METAMUCIL  1 tbs daily     warfarin 2 MG tablet  Commonly known as:  COUMADIN  TAKE 1  TABLET AS DIRECTED           Objective:   Physical Exam BP 118/68  Pulse 70  Temp(Src) 97.9 F (36.6 C)  Wt 156 lb (70.761 kg)  SpO2 93%  General -- alert, well-developed, NAD.  HEENT-- Not pale.   Lungs -- normal respiratory effort, no intercostal retractions, no accessory muscle use, + Fine crackles at bases.  Heart-- Irregular.  Extremities-- no pretibial edema bilaterally  Neurologic--  alert & oriented X3. Speech normal, gait normal, strength normal in all extremities.  Psych--  Still a little anxious but better than the last time he was here    Assessment & Plan:   Fatigue Not back to baseline since she had pneumonia last month. Followup chest x-ray show improvement of infiltrate. He did desat with exertion while in the pulmonary office He would like to stop Inderal. Has noted some cough and chest congestion the last couple days Plan: BMP, CBC Trial with Symbicort, sample provided, I don't believe he needs further antibiotics Pulse oximetry 24 hours

## 2013-06-02 NOTE — Progress Notes (Signed)
Pre visit review using our clinic review tool, if applicable. No additional management support is needed unless otherwise documented below in the visit note. 

## 2013-06-03 ENCOUNTER — Other Ambulatory Visit: Payer: Self-pay | Admitting: Internal Medicine

## 2013-06-03 DIAGNOSIS — Z79899 Other long term (current) drug therapy: Secondary | ICD-10-CM | POA: Diagnosis not present

## 2013-06-03 NOTE — Assessment & Plan Note (Signed)
Switch Inderal to metoprolol as suggested by pulmonary ,pt aware  tremors may increase

## 2013-06-03 NOTE — Assessment & Plan Note (Signed)
Continue with fatigue since he had pneumonia last month. Will check a 24-hour pulse oximetry. May benefit from oxygen therapy

## 2013-06-06 ENCOUNTER — Encounter: Payer: Self-pay | Admitting: Internal Medicine

## 2013-06-06 ENCOUNTER — Ambulatory Visit (INDEPENDENT_AMBULATORY_CARE_PROVIDER_SITE_OTHER): Payer: Medicare Other | Admitting: Internal Medicine

## 2013-06-06 VITALS — BP 102/60 | HR 81 | Temp 98.0°F | Ht 72.0 in | Wt 154.0 lb

## 2013-06-06 DIAGNOSIS — R05 Cough: Secondary | ICD-10-CM

## 2013-06-06 DIAGNOSIS — J449 Chronic obstructive pulmonary disease, unspecified: Secondary | ICD-10-CM

## 2013-06-06 DIAGNOSIS — I10 Essential (primary) hypertension: Secondary | ICD-10-CM

## 2013-06-06 DIAGNOSIS — J8410 Pulmonary fibrosis, unspecified: Secondary | ICD-10-CM | POA: Diagnosis not present

## 2013-06-06 DIAGNOSIS — R059 Cough, unspecified: Secondary | ICD-10-CM | POA: Diagnosis not present

## 2013-06-06 MED ORDER — NEBIVOLOL HCL 10 MG PO TABS: 10.0000 mg | ORAL_TABLET | Freq: Every day | ORAL | Status: DC

## 2013-06-06 NOTE — Progress Notes (Signed)
Subjective:   Patient ID: Brian [name], male    DOB: Aug 17, 1928.   MRN: [id number]   Brief patient profile:  84yowm quit smoking 1990  with dx of copd by screening pft's but he was not aware of any limitations until around 2006 with exertion minimally worse since then referred 01/07/2011 by Dr Larose Kells for sob but only very mild copd by pfts 06/09/2011    History of Present Illness  01/07/2011 Brian [name]/  Initial pulmonary office eval cc doe x exertion x 5 years, not progressing, not bothering sleep, no am cough or tendency to flares despite longterm maint on inderal for tremors.  Imp mild copd/ pf  rec  No f/u unless symptoms warrant    GB/ pancreatitis > lap chole 02/17/11 [facility] with abn ct abd  11/23 with fibrotic changes both bases so referred back to pulmonary clinic   02/28/2011 f/u ov/Brian [name] cc still hoarse, denies limiting doe, cc throat drainage and freq throat clearing in retrospect present for years, no worse lately. No purulent sputum or overt HB, no h/o seasonal rhinitis or exac with colds or eating.  rec Brian [name] diet F/u with pfts and cxr > did not follow     05/12/2013 Acute OV  Last seen in office 2013 - Complains of increased SOB w/ exertion and some orthopnea, head congestion w/ PND x1 month.  Progressive DOE x 1 week.  Denies any wheezing, chest tightness, cough, hemoptysis or fever. Per Does have, on and off Hoarseness , drainage, , no discolored mucus or fever.  On Coumadin for Atrial fib.  Hx of Chron's Dz- on Pentasa Feels his breathing has been getting worse over last 1 year.  Desats with walking 85% w/ quick rebound in low 90s  rec Saline nasal rinses As needed   Begin Allegra 180mg  daily for drainage   Begin Breo 1 puff daily , rinse after use.  Follow up Dr. Melvyn Novas  In 2 weeks and As needed    ? Left basilar PNA >add Levaquin x 7 d    05/26/2013 f/u ov/Brian [name] re: copd/f/u LLL pna  Chief Complaint  Patient presents with  . Follow-up    Pt states that his breathing  is much improved since last visit. Minimal dry cough for the past 2 days.   Not limited by breathing from desired activities   Better breathing on breo but now daytime dry cough. Still on relatively high dose inderal  rec Try off breo You may need to have your inderal adjusted down or off in the future for low pulse or worse breathing You need to pace yourself at a lower level when you exercise to prevent oxygen levels from dropping too low   Changed inderal to 06/02/13 to lopressor    06/06/2013 f/u ov/Brian [name] re: COPD with ?PF related to ali vs IPF Chief Complaint  Patient presents with  . Follow-up    Pt states that his breathing is no better since last visit. No new co's today.   cough resolved.    No obvious day to day or daytime variabilty or assoc  cp or chest tightness, subjective wheeze overt sinus or hb symptoms. No unusual exp hx or h/o childhood pna/ asthma or knowledge of premature birth.  Sleeping ok without nocturnal  or early am exacerbation  of respiratory  c/o's or need for noct saba. Also denies any obvious fluctuation of symptoms with weather or environmental changes or other aggravating or alleviating factors except as outlined above  Current Medications, Allergies, Complete Past Medical History, Past Surgical History, Family History, and Social History were reviewed in [employer] record.  ROS  The following are not active complaints unless bolded sore throat, dysphagia, dental problems, itching, sneezing,  nasal congestion or excess/ purulent secretions, ear ache,   fever, chills, sweats, unintended wt loss, pleuritic or exertional cp, hemoptysis,  orthopnea pnd or leg swelling, presyncope, palpitations, heartburn, abdominal pain, anorexia, nausea, vomiting, diarrhea  or change in bowel or urinary habits, change in stools or urine, dysuria,hematuria,  rash, arthralgias, visual complaints, headache, numbness weakness or ataxia or problems with  walking or coordination,  change in mood/affect or memory.                   Objective:   Physical Exam 178  01/07/11  >   172   02/28/2011 > 06/09/2011  177> 160 05/12/13 > 05/26/2013 158 > 06/06/2013   154   Edentulous wm nad minimally hoarse  HEENT mild turbinate edema.  Oropharynx no thrush or excess pnd or cobblestoning.  No JVD or cervical adenopathy. Mild accessory muscle hypertrophy. Trachea midline, nl thryroid. Chest was hyperinflated by percussion with diminished breath sounds and moderate increased exp time without wheeze. Hoover sign positive at mid inspiration. Regular rate and rhythm without murmur gallop or rub or increase P2 or edema.  Abd: no hsm, nl excursion. Ext warm without cyanosis  clubbing.       CXR  05/26/2013 :   Prominent interstitial markings remain stable at both lung bases, possibly representing pulmonary fibrosis          Assessment & Plan:  \

## 2013-06-06 NOTE — Addendum Note (Signed)
Addended by: Peggyann Shoals on: 06/06/2013 05:13 PM   Modules accepted: Orders

## 2013-06-06 NOTE — Addendum Note (Signed)
Addended by: Peggyann Shoals on: 06/06/2013 08:48 AM   Modules accepted: Orders

## 2013-06-06 NOTE — Patient Instructions (Addendum)
Stop metaprolol for now  Bystolic 10 mg one daily   Return for pfts - in meantime pace yourself with activity  Add will need HRCT on return also

## 2013-06-07 ENCOUNTER — Ambulatory Visit (HOSPITAL_BASED_OUTPATIENT_CLINIC_OR_DEPARTMENT_OTHER)
Admission: RE | Admit: 2013-06-07 | Discharge: 2013-06-07 | Disposition: A | Discharge status: Home / Self Care | Payer: Medicare Other | Source: Ambulatory Visit | Attending: Internal Medicine | Admitting: Internal Medicine

## 2013-06-07 DIAGNOSIS — R059 Cough, unspecified: Secondary | ICD-10-CM | POA: Insufficient documentation

## 2013-06-07 DIAGNOSIS — R5383 Other fatigue: Secondary | ICD-10-CM

## 2013-06-07 DIAGNOSIS — R05 Cough: Secondary | ICD-10-CM | POA: Diagnosis not present

## 2013-06-07 DIAGNOSIS — R942 Abnormal results of pulmonary function studies: Secondary | ICD-10-CM | POA: Insufficient documentation

## 2013-06-07 DIAGNOSIS — J8410 Pulmonary fibrosis, unspecified: Secondary | ICD-10-CM

## 2013-06-07 DIAGNOSIS — R0902 Hypoxemia: Secondary | ICD-10-CM

## 2013-06-07 NOTE — Assessment & Plan Note (Signed)
-   Spirometry 09/03/10  FEV 1  1.85 (56%) ratio 70     -  PFT's 06/09/2011 FEV1 2.05 (73%)  with ratio 67% and DLCO 64 corrects to 91     - trial off breo 05/26/2013 due to dry cough > resolved      - trial off inderal 06/02/13 > will change to bystolic to get the most selective BB and avoid inhalers which tend to make him cough

## 2013-06-07 NOTE — Assessment & Plan Note (Signed)
Off inderal as of 2/64/15 > try bystolic 10 mg samples as Strongly prefer in this setting: Bystolic, the most beta -1  selective Beta blocker available in sample form, with bisoprolol the most selective generic choice  on the market.

## 2013-06-07 NOTE — Assessment & Plan Note (Signed)
-   resolved off breo typical of  Classic Upper airway cough syndrome, so named because it's frequently impossible to sort out how much is  CR/sinusitis with freq throat clearing (which can be related to primary GERD)   vs  causing  secondary (" extra esophageal")  GERD from wide swings in gastric pressure that occur with throat clearing, often  promoting self use of mint and menthol lozenges that reduce the lower esophageal sphincter tone and exacerbate the problem further in a cyclical fashion.   These are the same pts (now being labeled as having "irritable larynx syndrome" by some cough centers) who not infrequently have a history of having failed to tolerate ace inhibitors,  dry powder inhalers or biphosphonates or report having atypical reflux symptoms that don't respond to standard doses of PPI , and are easily confused as having aecopd or asthma flares by even experienced allergists/ pulmonologists.   For now leave off all inhalers

## 2013-06-10 ENCOUNTER — Telehealth (INDEPENDENT_AMBULATORY_CARE_PROVIDER_SITE_OTHER): Payer: Medicare Other | Admitting: *Deleted

## 2013-06-10 ENCOUNTER — Other Ambulatory Visit: Payer: Medicare Other

## 2013-06-10 DIAGNOSIS — D72829 Elevated white blood cell count, unspecified: Secondary | ICD-10-CM | POA: Diagnosis not present

## 2013-06-10 LAB — POCT URINALYSIS DIPSTICK
BILIRUBIN UA: NEGATIVE
GLUCOSE UA: NEGATIVE
KETONES UA: NEGATIVE
Leukocytes, UA: NEGATIVE
Nitrite, UA: NEGATIVE
PH UA: 6
Protein, UA: NEGATIVE
RBC UA: NEGATIVE
SPEC GRAV UA: 1.02
Urobilinogen, UA: 0.2

## 2013-06-10 NOTE — Telephone Encounter (Signed)
Patient called requesting results for CXR and urine dip. CXR results have not been reviewed by PCP. Patient was informed that he would receive a phone call and/or letter with results. JG//CMA  Order for UA dip put in and results were recorded. JG//CMA

## 2013-06-11 NOTE — Telephone Encounter (Signed)
Chest x-ray was discussed with pulmonary, is hard to tell if there is any acute changes. Urine was negative. Advise patient: Need to repeat a CBC in one week, DX leukocytosis. Patient to call if fever, chills, increased cough.

## 2013-06-13 ENCOUNTER — Other Ambulatory Visit: Payer: Self-pay | Admitting: Internal Medicine

## 2013-06-13 ENCOUNTER — Other Ambulatory Visit: Payer: Medicare Other

## 2013-06-13 DIAGNOSIS — D72829 Elevated white blood cell count, unspecified: Secondary | ICD-10-CM

## 2013-06-13 NOTE — Telephone Encounter (Signed)
Spoke with pt and made aware of lab and CXR results.Appointment made for tomorrow 06/15/23 at 8:45 for repeat labs.

## 2013-06-13 NOTE — Telephone Encounter (Signed)
The patient is hoping to speak directly with the [place] regarding his results. Thanks!

## 2013-06-14 ENCOUNTER — Telehealth: Payer: Self-pay

## 2013-06-14 ENCOUNTER — Other Ambulatory Visit (INDEPENDENT_AMBULATORY_CARE_PROVIDER_SITE_OTHER): Payer: Medicare Other

## 2013-06-14 DIAGNOSIS — D72829 Elevated white blood cell count, unspecified: Secondary | ICD-10-CM | POA: Diagnosis not present

## 2013-06-14 LAB — CBC WITH DIFFERENTIAL/PLATELET
BASOS ABS: 0 10*3/uL (ref 0.0–0.1)
BASOS PCT: 0.3 % (ref 0.0–3.0)
EOS ABS: 0.5 10*3/uL (ref 0.0–0.7)
Eosinophils Relative: 5.3 % — ABNORMAL HIGH (ref 0.0–5.0)
HEMATOCRIT: 44.9 % (ref 39.0–52.0)
HEMOGLOBIN: 14.8 g/dL (ref 13.0–17.0)
LYMPHS ABS: 1.6 10*3/uL (ref 0.7–4.0)
Lymphocytes Relative: 17.3 % (ref 12.0–46.0)
MCHC: 32.9 g/dL (ref 30.0–36.0)
MCV: 94.5 fl (ref 78.0–100.0)
MONO ABS: 0.5 10*3/uL (ref 0.1–1.0)
Monocytes Relative: 5.6 % (ref 3.0–12.0)
NEUTROS ABS: 6.8 10*3/uL (ref 1.4–7.7)
Neutrophils Relative %: 71.5 % (ref 43.0–77.0)
Platelets: 249 10*3/uL (ref 150.0–400.0)
RBC: 4.75 Mil/uL (ref 4.22–5.81)
RDW: 13.9 % (ref 11.5–14.6)
WBC: 9.5 10*3/uL (ref 4.5–10.5)

## 2013-06-14 NOTE — Telephone Encounter (Signed)
UDS: 06/03/2013 Negative for Klonopin (PRN) Low Risk per Dr Larose Kells

## 2013-06-17 ENCOUNTER — Ambulatory Visit (INDEPENDENT_AMBULATORY_CARE_PROVIDER_SITE_OTHER): Payer: Medicare Other | Admitting: Pharmacist

## 2013-06-17 DIAGNOSIS — Z7901 Long term (current) use of anticoagulants: Secondary | ICD-10-CM

## 2013-06-17 DIAGNOSIS — I4891 Unspecified atrial fibrillation: Secondary | ICD-10-CM

## 2013-06-17 LAB — POCT INR: INR: 2.5

## 2013-06-23 ENCOUNTER — Telehealth: Payer: Self-pay | Admitting: Internal Medicine

## 2013-06-23 NOTE — Telephone Encounter (Signed)
Pt is aware of MW's recs. Nothing further was needed.

## 2013-06-23 NOTE — Telephone Encounter (Signed)
Very unlikely the bystolic is causing his diarrhea and I note he has Chrohns dz so rec Try taking bystolic one half daily  (may need pill cutter) He should contact his chron's doc

## 2013-06-23 NOTE — Telephone Encounter (Signed)
Pt states that he was started on Bystolic 10 mg on 1-61-09 and was dong good for the first 7 days; since then he has noticed he is having diarrhea that continues to get worse (past 2 weeks). He states he take the Bystolic around 4 pm daily without any other meds. Pt is tired and has no energy as well. Pt is concerned and would like to know what to do. MW please advise. Thanks.

## 2013-06-27 ENCOUNTER — Ambulatory Visit: Payer: Medicare Other | Admitting: Gastroenterology

## 2013-06-30 ENCOUNTER — Ambulatory Visit: Payer: Medicare Other | Admitting: Internal Medicine

## 2013-07-01 ENCOUNTER — Encounter: Payer: Self-pay | Admitting: Internal Medicine

## 2013-07-01 ENCOUNTER — Ambulatory Visit (INDEPENDENT_AMBULATORY_CARE_PROVIDER_SITE_OTHER): Payer: Medicare Other | Admitting: Internal Medicine

## 2013-07-01 ENCOUNTER — Ambulatory Visit (INDEPENDENT_AMBULATORY_CARE_PROVIDER_SITE_OTHER): Payer: Medicare Other | Admitting: Pharmacist

## 2013-07-01 VITALS — BP 142/66 | HR 60 | Temp 98.0°F | Wt 152.0 lb

## 2013-07-01 DIAGNOSIS — I10 Essential (primary) hypertension: Secondary | ICD-10-CM | POA: Diagnosis not present

## 2013-07-01 DIAGNOSIS — R259 Unspecified abnormal involuntary movements: Secondary | ICD-10-CM

## 2013-07-01 DIAGNOSIS — F411 Generalized anxiety disorder: Secondary | ICD-10-CM

## 2013-07-01 DIAGNOSIS — Z7901 Long term (current) use of anticoagulants: Secondary | ICD-10-CM | POA: Diagnosis not present

## 2013-07-01 DIAGNOSIS — I4891 Unspecified atrial fibrillation: Secondary | ICD-10-CM

## 2013-07-01 DIAGNOSIS — R5381 Other malaise: Secondary | ICD-10-CM | POA: Diagnosis not present

## 2013-07-01 DIAGNOSIS — R5383 Other fatigue: Secondary | ICD-10-CM

## 2013-07-01 LAB — POCT INR: INR: 4

## 2013-07-01 MED ORDER — CLONAZEPAM 0.5 MG PO TABS: 0.5000 mg | ORAL_TABLET | Freq: Three times a day (TID) | ORAL | Status: DC | PRN

## 2013-07-01 NOTE — Patient Instructions (Signed)
Next visit is for routine check up in 4 months  No need to come back fasting Please make an appointment

## 2013-07-01 NOTE — Assessment & Plan Note (Signed)
Currently doing very well on bystolic. No change

## 2013-07-01 NOTE — Progress Notes (Signed)
Pre visit review using our clinic review tool, if applicable. No additional management support is needed unless otherwise documented below in the visit note. 

## 2013-07-01 NOTE — Progress Notes (Signed)
Subjective:    Patient ID: Brian [name], male    DOB: September 10, 1928, 80 y.o.   MRN: [id number]  DOS:  07/01/2013 Type of  visit:  Followup from previous visit Was seen with fatigue, feels a lot better. Saw pulmonary, currently off Symbicort. Beta blockers were change, currently on byastolic---> BP remains very good, tremor has not change/increased significantly. He was ordered a 24-hour pulse oximetry but apparently that never happened. Needs a refill on clonazepam which worked very well.  ROS Denies fever chills. Minimal cough. No chest pain, no wheezing.  Past Medical History  Diagnosis Date  . Muscle tremor     saw neurology remotely elsewhere, was Rx inderal  . Crohn's ileocolitis   . Osteopenia     per DEXA 11/2008  . Hypertension 11/11    mild  . Hemorrhoids   . Hypertrophy of prostate     w/o UR obst & oth luts  . Peripheral neuropathy     h/o neg w/u  . Osteoarthritis     ankle,right  . ED (erectile dysfunction)   . Hypertriglyceridemia   . B12 deficiency   . COPD (chronic obstructive pulmonary disease)     recent dx--no acute problems  . Cancer     squamous cell skin cancers-tx'd q58yrs  . Hiatal hernia 1956    found while in service  . Anxiety   . Atrial fibrillation with RVR   . Long term (current) use of anticoagulants 02/21/2011  . Serrated adenoma of colon 03/2010    Past Surgical History  Procedure Laterality Date  . Resection of distal ileum and cecum w/ appendectomy      18-inch small intestine, 1984 for Crohn's disease  . Colon surgery  1983    has chrohn's  . Appendectomy  1983  . Cholecystectomy  02/17/2011    Procedure: LAPAROSCOPIC CHOLECYSTECTOMY WITH INTRAOPERATIVE CHOLANGIOGRAM;  Surgeon: Edward Jolly, MD;  Location: [facility];  Service: General;  Laterality: N/A;  . Cataract extraction      bilateral ~ 2007    History   Social History  . Marital Status: Married    Spouse Name: N/A    Number of Children: 4  . Years of  Education: N/A   Occupational History  . Retired    Social History Main Topics  . Smoking status: Former Smoker -- 1.00 packs/day for 40 years    Types: Cigarettes, Pipe    Quit date: 03/24/1990  . Smokeless tobacco: Never Used  . Alcohol Use: No     Comment:    . Drug Use: No  . Sexual Activity: Not Currently    Birth Control/ Protection: None   Other Topics Concern  . Not on file   Social History Narrative   Married, lives w/ wife         Medication List       This list is accurate as of: 07/01/13 11:59 PM.  Always use your most recent med list.               acetaminophen 325 MG tablet  Commonly known as:  TYLENOL  Take 650 mg by mouth as needed for pain.     amLODipine 5 MG tablet  Commonly known as:  NORVASC  Take 1 tablet daily.     busPIRone 15 MG tablet  Commonly known as:  BUSPAR  1 tablet every am and 1/2 tablet every evening     CALTRATE 600+D PO  Take 1 tablet  by mouth daily.     CLARITIN 10 MG tablet  Generic drug:  loratadine  Take 10 mg by mouth daily.     clonazePAM 0.5 MG tablet  Commonly known as:  KLONOPIN  Take 1 tablet (0.5 mg total) by mouth 3 (three) times daily as needed for anxiety.     fexofenadine 180 MG tablet  Commonly known as:  ALLEGRA  Take 180 mg by mouth daily.     gemfibrozil 600 MG tablet  Commonly known as:  LOPID  TAKE 1 TABLET TWICE A DAY     mesalamine 250 MG CR capsule  Commonly known as:  PENTASA  Take 2 capsules (500 mg total) by mouth 2 (two) times daily.     NASCOBAL 500 MCG/0.1ML Soln  Generic drug:  Cyanocobalamin  USE ONE SPRAY NASALLY AS DIRECTED PER WEEK     nebivolol 10 MG tablet  Commonly known as:  BYSTOLIC  Take 1 tablet (10 mg total) by mouth daily.     psyllium 58.6 % powder  Commonly known as:  METAMUCIL  1 tbs daily     warfarin 2 MG tablet  Commonly known as:  COUMADIN  TAKE 1 TABLET AS DIRECTED           Objective:   Physical Exam BP 142/66  Pulse 60  Temp(Src) 98 F  (36.7 C)  Wt 152 lb (68.947 kg)  SpO2 92%  General -- alert, well-developed, NAD.   Lungs -- normal respiratory effort, no intercostal retractions, no accessory muscle use, and decreasel breath sounds.  Heart-- irreg Extremities-- no pretibial edema bilaterally  Neurologic--  alert & oriented X3. Speech normal, gait normal, strength normal in all extremities.  Psych-- Cognition and judgment appear intact. Cooperative with normal attention span and concentration. No anxious or depressed appearing.       Assessment & Plan:   Fatigue--resolved

## 2013-07-01 NOTE — Assessment & Plan Note (Signed)
Currently on BuSpar, recently added clonazepam which worked really well. UDS last month low risk. No excessive somnolence from clonazepam. Plan: Refill on diazepam, no change

## 2013-07-01 NOTE — Assessment & Plan Note (Signed)
Change from Inderal to bystolic, tremor not worse

## 2013-07-05 ENCOUNTER — Encounter: Payer: Self-pay | Admitting: Internal Medicine

## 2013-07-06 ENCOUNTER — Encounter: Payer: Self-pay | Admitting: Internal Medicine

## 2013-07-06 ENCOUNTER — Ambulatory Visit (INDEPENDENT_AMBULATORY_CARE_PROVIDER_SITE_OTHER): Payer: Medicare Other | Admitting: Internal Medicine

## 2013-07-06 VITALS — BP 108/60 | HR 86 | Temp 98.0°F | Ht 71.0 in | Wt 148.0 lb

## 2013-07-06 DIAGNOSIS — J449 Chronic obstructive pulmonary disease, unspecified: Secondary | ICD-10-CM

## 2013-07-06 DIAGNOSIS — J8410 Pulmonary fibrosis, unspecified: Secondary | ICD-10-CM

## 2013-07-06 LAB — PULMONARY FUNCTION TEST
DL/VA % PRED: 43 %
DL/VA: 2.01 ml/min/mmHg/L
DLCO UNC: 8.71 ml/min/mmHg
DLCO unc % pred: 25 %
FEF 25-75 Post: 1.95 L/sec
FEF 25-75 Pre: 1.58 L/sec
FEF2575-%Change-Post: 23 %
FEF2575-%PRED-PRE: 82 %
FEF2575-%Pred-Post: 101 %
FEV1-%Change-Post: 5 %
FEV1-%PRED-POST: 77 %
FEV1-%Pred-Pre: 73 %
FEV1-POST: 2.21 L
FEV1-PRE: 2.1 L
FEV1FVC-%CHANGE-POST: 2 %
FEV1FVC-%Pred-Pre: 105 %
FEV6-%CHANGE-POST: 4 %
FEV6-%PRED-PRE: 73 %
FEV6-%Pred-Post: 76 %
FEV6-POST: 2.9 L
FEV6-Pre: 2.77 L
FEV6FVC-%CHANGE-POST: 1 %
FEV6FVC-%PRED-POST: 107 %
FEV6FVC-%Pred-Pre: 106 %
FVC-%CHANGE-POST: 3 %
FVC-%Pred-Post: 71 %
FVC-%Pred-Pre: 69 %
FVC-Post: 2.9 L
FVC-Pre: 2.81 L
PRE FEV1/FVC RATIO: 75 %
Post FEV1/FVC ratio: 76 %
Post FEV6/FVC ratio: 100 %
Pre FEV6/FVC Ratio: 99 %
RV % pred: 52 %
RV: 1.46 L
TLC % pred: 58 %
TLC: 4.26 L

## 2013-07-06 MED ORDER — NEBIVOLOL HCL 10 MG PO TABS: 10.0000 mg | ORAL_TABLET | Freq: Every day | ORAL | Status: DC

## 2013-07-06 NOTE — Progress Notes (Signed)
Subjective:   Patient ID: Brian [name], male    DOB: [birth year].   MRN: [id number]   Brief patient profile:  74  yowm with Crohn's dz quit smoking 1990  with dx of copd by screening pft's but he was not aware of any limitations until around 2006 with exertion minimally worse since then referred 01/07/2011 by Dr Larose Kells for sob but only very mild copd by pfts 06/09/2011    History of Present Illness  01/07/2011 Brian [name]/  Initial pulmonary office eval cc doe x exertion x 5 years, not progressing, not bothering sleep, no am cough or tendency to flares despite longterm maint on inderal for tremors.  Imp mild copd/ pf  rec  No f/u unless symptoms warrant    GB/ pancreatitis > lap chole 02/17/11 [facility] with abn ct abd  11/23 with fibrotic changes both bases so referred back to pulmonary clinic   02/28/2011 f/u ov/Brian [name] cc still hoarse, denies limiting doe, cc throat drainage and freq throat clearing in retrospect present for years, no worse lately. No purulent sputum or overt HB, no h/o seasonal rhinitis or exac with colds or eating.  rec Brian [name] diet F/u with pfts and cxr > did not follow     05/12/2013 Acute OV  Last seen in office 2013 - Complains of increased SOB w/ exertion and some orthopnea, head congestion w/ PND x1 month.  Progressive DOE x 1 week.  Denies any wheezing, chest tightness, cough, hemoptysis or fever. Per Does have, on and off Hoarseness , drainage, , no discolored mucus or fever.  On Coumadin for Atrial fib.  Hx of Chron's Dz- on Pentasa Feels his breathing has been getting worse over last 1 year.  Desats with walking 85% w/ quick rebound in low 90s  rec Saline nasal rinses As needed   Begin Allegra 180mg  daily for drainage   Begin Breo 1 puff daily , rinse after use.  Follow up Dr. Melvyn [name]  In 2 weeks and As needed    ? Left basilar PNA >add Levaquin x 7 d    05/26/2013 f/u ov/Brian [name] re: copd/f/u LLL pna  Chief Complaint  Patient presents with  . Follow-up    Pt states  that his breathing is much improved since last visit. Minimal dry cough for the past 2 days.   Not limited by breathing from desired activities   Better breathing on breo but now daytime dry cough. Still on relatively high dose inderal  rec Try off breo You may need to have your inderal adjusted down or off in the future for low pulse or worse breathing You need to pace yourself at a lower level when you exercise to prevent oxygen levels from dropping too low   Changed inderal to 06/02/13 to lopressor    06/06/2013 f/u ov/Brian [name] re: COPD with ?PF related to ali vs IPF Chief Complaint  Patient presents with  . Follow-up    Pt states that his breathing is no better since last visit. No new co's today.   cough resolved.   rec Stop metaprolol for now Bystolic 10 mg one daily  Return for pfts - in meantime pace yourself with activity  Add will need HRCT on return also   07/06/2013 f/u ov/Brian [name] re: pf  Chief Complaint  Patient presents with  . Followup with PFT    Pt states that his breathing has been better over the past 2 wks.   Did [employer] ok.  No obvious day to day or  daytime variabilty or assoc cough or  cp or chest tightness, subjective wheeze overt sinus or hb symptoms. No unusual exp hx or h/o childhood pna/ asthma or knowledge of premature birth.  Sleeping ok without nocturnal  or early am exacerbation  of respiratory  c/o's or need for noct saba. Also denies any obvious fluctuation of symptoms with weather or environmental changes or other aggravating or alleviating factors except as outlined above   Current Medications, Allergies, Complete Past Medical History, Past Surgical History, Family History, and Social History were reviewed in [employer] record.  ROS  The following are not active complaints unless bolded sore throat, dysphagia, dental problems, itching, sneezing,  nasal congestion or excess/ purulent secretions, ear ache,   fever, chills, sweats,  unintended wt loss, pleuritic or exertional cp, hemoptysis,  orthopnea pnd or leg swelling, presyncope, palpitations, heartburn, abdominal pain, anorexia, nausea, vomiting, diarrhea  or change in bowel or urinary habits, change in stools or urine, dysuria,hematuria,  rash, arthralgias, visual complaints, headache, numbness weakness or ataxia or problems with walking or coordination,  change in mood/affect or memory.                   Objective:   Physical Exam 178  01/07/11  >   172   02/28/2011 > 06/09/2011  177> 160 05/12/13 > 05/26/2013 158 > 06/06/2013   154 >  07/06/2013 148   Edentulous wm nad minimally hoarse  HEENT mild turbinate edema.  Oropharynx no thrush or excess pnd or cobblestoning.  No JVD or cervical adenopathy. Mild accessory muscle hypertrophy. Trachea midline, nl thryroid. Chest was hyperinflated by percussion with diminished breath sounds and moderate increased exp time without wheeze. Hoover sign positive at mid inspiration. Regular rate and rhythm without murmur gallop or rub or increase P2 or edema.  Abd: no hsm, nl excursion. Ext warm without cyanosis  clubbing.       CXR  05/26/2013 :   Prominent interstitial markings remain stable at both lung bases, possibly representing pulmonary fibrosis          Assessment & Plan:  \

## 2013-07-06 NOTE — Patient Instructions (Addendum)
Continue bystolic 10 mg daily   Please schedule a follow up visit in 3 months but call sooner if needed  Needs hrct on return

## 2013-07-06 NOTE — Progress Notes (Signed)
PFT done today. 

## 2013-07-07 ENCOUNTER — Ambulatory Visit (INDEPENDENT_AMBULATORY_CARE_PROVIDER_SITE_OTHER): Payer: Medicare Other | Admitting: Licensed Clinical Social Worker

## 2013-07-07 DIAGNOSIS — F4323 Adjustment disorder with mixed anxiety and depressed mood: Secondary | ICD-10-CM | POA: Diagnosis not present

## 2013-07-08 ENCOUNTER — Ambulatory Visit (INDEPENDENT_AMBULATORY_CARE_PROVIDER_SITE_OTHER): Payer: Medicare Other | Admitting: Cardiology

## 2013-07-08 ENCOUNTER — Other Ambulatory Visit (INDEPENDENT_AMBULATORY_CARE_PROVIDER_SITE_OTHER): Payer: Medicare Other

## 2013-07-08 ENCOUNTER — Encounter: Payer: Self-pay | Admitting: Gastroenterology

## 2013-07-08 ENCOUNTER — Telehealth: Payer: Self-pay | Admitting: Gastroenterology

## 2013-07-08 ENCOUNTER — Other Ambulatory Visit: Payer: Self-pay

## 2013-07-08 ENCOUNTER — Ambulatory Visit (INDEPENDENT_AMBULATORY_CARE_PROVIDER_SITE_OTHER): Payer: Medicare Other | Admitting: Gastroenterology

## 2013-07-08 ENCOUNTER — Telehealth: Payer: Self-pay | Admitting: Cardiology

## 2013-07-08 ENCOUNTER — Other Ambulatory Visit: Payer: Self-pay | Admitting: *Deleted

## 2013-07-08 VITALS — BP 120/72 | HR 70 | Ht 71.5 in | Wt 147.6 lb

## 2013-07-08 DIAGNOSIS — R197 Diarrhea, unspecified: Secondary | ICD-10-CM

## 2013-07-08 DIAGNOSIS — K5090 Crohn's disease, unspecified, without complications: Secondary | ICD-10-CM

## 2013-07-08 DIAGNOSIS — Z7901 Long term (current) use of anticoagulants: Secondary | ICD-10-CM

## 2013-07-08 DIAGNOSIS — I4891 Unspecified atrial fibrillation: Secondary | ICD-10-CM

## 2013-07-08 LAB — PROTIME-INR
INR: 3.9 ratio — ABNORMAL HIGH (ref 0.8–1.0)
PROTHROMBIN TIME: 41.5 s — AB (ref 10.2–12.4)

## 2013-07-08 LAB — SEDIMENTATION RATE: Sed Rate: 72 mm/hr — ABNORMAL HIGH (ref 0–22)

## 2013-07-08 LAB — C-REACTIVE PROTEIN: CRP: <0.5 mg/dL (ref 0.5–20.0)

## 2013-07-08 MED ORDER — MESALAMINE ER 250 MG PO CPCR: 500.0000 mg | ORAL_CAPSULE | Freq: Two times a day (BID) | ORAL | Status: DC

## 2013-07-08 NOTE — Progress Notes (Signed)
07/08/2013 Brian SWEEDEN [id number] [birth year]   History of Present Illness:  This is an 80 year old male with a history of Crohn's ileocolitis, status post right colon resection in 1984. He is well known to Dr. Fuller Plan.  He last underwent colonoscopy in January 2012 showing inactive colitis and an adenomatous colon polyp.  He comes to our office today with his wife with complaints of intense diarrhea for the past 3-4 weeks. He has been using a lot of Pepto-Bismol, which has made his stool black but he has not seen any red blood in his stool. During the day he has only a couple of bowel movements, but at night he has several bowel movements with a large amount of incontinence and has to wear Pull-ups. He was recently on antibiotics in February for pneumonia; this was prior to the onset of the diarrhea. He says that he does have some occasional loose stools at baseline, but overall his Crohn's disease has been well controlled on Pentasa 500 mg twice daily. He complains of some right groin pain that he says comes and goes with movement.  CBC and BMP within the past month were within normal limits.   Current Medications, Allergies, Past Medical History, Past Surgical History, Family History and Social History were reviewed in [employer] record.   Physical Exam: BP 120/72  Pulse 70  Ht 5' 11.5" (1.816 m)  Wt 147 lb 9.6 oz (66.951 kg)  BMI 20.30 kg/m2 General:  Elderly white male in no acute distress Head: Normocephalic and atraumatic Eyes:  Sclerae anicteric, conjunctiva pink  Ears: Normal auditory acuity Lungs: Clear throughout to auscultation Heart: Regular rate and rhythm Abdomen: Soft, non-distended.  Normal bowel sounds.  Minimal right-sided TTP without R/R/G. Musculoskeletal: Symmetrical with no gross deformities  Extremities: No edema  Neurological: Alert oriented x 4, grossly non-focal Psychological:  Alert and cooperative. Normal mood and affect  Assessment and  Recommendations: -Diarrhea:  He was on recent antibiotics for pneumonia prior to onset of the diarrhea. Rule out infectious source, most importantly C. Difficile. Will check stool GI pathogen panel. -Crohn's ileocolitis with prior right hemicolectomy. Overall this has been stable on Pentasa 500 mg twice daily. Question is the source of his diarrhea could be a Crohn's flare. Will check a sedimentation rate and CRP. We will renew his Pentasa prescription at his request. -Right groin pain:  Question inguinal hernia versus date/orthopedic pathology. I doubt that this in particular is GI related, but he did have some mild tenderness in the right side of the abdomen as well, which could be related to his Crohn's/diarrhea versus scar tissue from previous right hemicolectomy.  *We will followup these results and make further recommendations from there.

## 2013-07-08 NOTE — Telephone Encounter (Signed)
New message     Had coumadin appt today----pt was seen at [place] office today and they checked his pt/inr.

## 2013-07-08 NOTE — Telephone Encounter (Signed)
Called pt and coumadin dosing done with pt's understanding See Coumadin encounter for this date

## 2013-07-08 NOTE — Progress Notes (Signed)
Reviewed and agree with management plan.  Marthena Whitmyer T. Vedika Dumlao, MD FACG 

## 2013-07-08 NOTE — Patient Instructions (Addendum)
Your physician has requested that you go to the basement for lab work before leaving today.  We have sent the following prescriptions to your mail in pharmacy: Pentasa   I appreciate the opportunity to care for you.

## 2013-07-08 NOTE — Telephone Encounter (Signed)
Patient with a 2 week history of worsening diarrhea.  Now needing to wear depends. He will come in and see Alonza Bogus, PA today at 11:00

## 2013-07-10 ENCOUNTER — Encounter: Payer: Self-pay | Admitting: Internal Medicine

## 2013-07-10 NOTE — Assessment & Plan Note (Signed)
First noted CT Abd 02/14/11 - 05/26/2013  Walked RA x 3 laps @ 185 ft each stopped due to  End of study desat to 83%  - 07/06/2013  Walked RA x 2 laps @ 185 ft each stopped due to sob with  sats 88%  Where was 85% on last study at 2 laps   - PFT's 06/09/2011  VC 3.21 (73%)  with ratio 67% and DLCO 64 corrects to 91  - PFT's 07/06/2013  VC 2.81 (68%)  with no airflow obst /  DLCO 25 corrects to 43  pfts clearly worse though he's moving so slowly now he doesn't note as much sob and not desaturating.   Will need HRCT to complete the w/u

## 2013-07-10 NOTE — Assessment & Plan Note (Signed)
-   Spirometry 09/03/10  FEV 1  1.85 (56%) ratio 70     -  PFT's 06/09/2011 FEV1 2.05 (73%)  with ratio 67% and DLCO 64 corrects to 91     - trial off breo 05/26/2013 due to dry cough > resolved      - trial off inderal 06/02/13 >>> subjectiveily  better 07/06/2013 s evidence of any airflow obst on pfts > continue off inderal

## 2013-07-11 ENCOUNTER — Other Ambulatory Visit: Payer: Self-pay | Admitting: Internal Medicine

## 2013-07-11 ENCOUNTER — Telehealth: Payer: Self-pay | Admitting: Gastroenterology

## 2013-07-11 DIAGNOSIS — R918 Other nonspecific abnormal finding of lung field: Secondary | ICD-10-CM

## 2013-07-11 LAB — GASTROINTESTINAL PATHOGEN PANEL PCR
C. difficile Tox A/B, PCR: NEGATIVE
Campylobacter, PCR: NEGATIVE
Cryptosporidium, PCR: NEGATIVE
E COLI (ETEC) LT/ST, PCR: NEGATIVE
E coli (STEC) stx1/stx2, PCR: NEGATIVE
E coli 0157, PCR: NEGATIVE
Giardia lamblia, PCR: NEGATIVE
Norovirus, PCR: NEGATIVE
Rotavirus A, PCR: NEGATIVE
SALMONELLA, PCR: NEGATIVE
SHIGELLA, PCR: NEGATIVE

## 2013-07-11 NOTE — Telephone Encounter (Signed)
Spoke with patient and told him the stool study is not ready yet and we will call when we get results.

## 2013-07-12 ENCOUNTER — Encounter: Payer: Self-pay | Admitting: *Deleted

## 2013-07-12 ENCOUNTER — Other Ambulatory Visit: Payer: Self-pay | Admitting: *Deleted

## 2013-07-12 DIAGNOSIS — R197 Diarrhea, unspecified: Secondary | ICD-10-CM

## 2013-07-12 DIAGNOSIS — R109 Unspecified abdominal pain: Secondary | ICD-10-CM

## 2013-07-12 DIAGNOSIS — K5010 Crohn's disease of large intestine without complications: Secondary | ICD-10-CM

## 2013-07-13 ENCOUNTER — Telehealth: Payer: Self-pay | Admitting: Internal Medicine

## 2013-07-14 ENCOUNTER — Inpatient Hospital Stay: Admission: RE | Admit: 2013-07-14 | Payer: Self-pay | Source: Ambulatory Visit

## 2013-07-14 ENCOUNTER — Ambulatory Visit (INDEPENDENT_AMBULATORY_CARE_PROVIDER_SITE_OTHER): Payer: Medicare Other | Admitting: *Deleted

## 2013-07-14 ENCOUNTER — Ambulatory Visit: Payer: Medicare Other | Admitting: Licensed Clinical Social Worker

## 2013-07-14 ENCOUNTER — Ambulatory Visit (INDEPENDENT_AMBULATORY_CARE_PROVIDER_SITE_OTHER)
Admission: RE | Admit: 2013-07-14 | Discharge: 2013-07-14 | Disposition: A | Discharge status: Home / Self Care | Payer: Medicare Other | Source: Ambulatory Visit | Attending: Gastroenterology | Admitting: Gastroenterology

## 2013-07-14 DIAGNOSIS — Z7901 Long term (current) use of anticoagulants: Secondary | ICD-10-CM | POA: Diagnosis not present

## 2013-07-14 DIAGNOSIS — I4891 Unspecified atrial fibrillation: Secondary | ICD-10-CM

## 2013-07-14 DIAGNOSIS — K5010 Crohn's disease of large intestine without complications: Secondary | ICD-10-CM | POA: Diagnosis not present

## 2013-07-14 DIAGNOSIS — R109 Unspecified abdominal pain: Secondary | ICD-10-CM | POA: Diagnosis not present

## 2013-07-14 DIAGNOSIS — R197 Diarrhea, unspecified: Secondary | ICD-10-CM

## 2013-07-14 LAB — POCT INR: INR: 2.5

## 2013-07-14 MED ORDER — NEBIVOLOL HCL 10 MG PO TABS: 10.0000 mg | ORAL_TABLET | Freq: Every day | ORAL | Status: DC

## 2013-07-14 MED ORDER — IOHEXOL 300 MG/ML  SOLN
100.0000 mL | Freq: Once | INTRAMUSCULAR | Status: AC | PRN
  Administered 2013-07-14: 100 mL via INTRAVENOUS

## 2013-07-14 NOTE — Telephone Encounter (Signed)
Spoke with the pt's spouse to verify this msg She states that the Bystolic rx needs to be sent to [facility] for a 90 days supply  I have sent this in  Nothing further needed per spouse

## 2013-07-14 NOTE — Telephone Encounter (Signed)
Should have been sent to triage as I was not in the office at the time msg was sent ? What rx? I called the pt x 3 and line was busy [facility]

## 2013-07-15 ENCOUNTER — Other Ambulatory Visit: Payer: Self-pay | Admitting: *Deleted

## 2013-07-15 MED ORDER — GEMFIBROZIL 600 MG PO TABS: ORAL_TABLET | ORAL | Status: DC

## 2013-07-18 ENCOUNTER — Ambulatory Visit (INDEPENDENT_AMBULATORY_CARE_PROVIDER_SITE_OTHER): Payer: Medicare Other | Admitting: Gastroenterology

## 2013-07-18 ENCOUNTER — Encounter: Payer: Self-pay | Admitting: Gastroenterology

## 2013-07-18 VITALS — BP 122/68 | HR 60 | Ht 70.75 in | Wt 149.5 lb

## 2013-07-18 DIAGNOSIS — R197 Diarrhea, unspecified: Secondary | ICD-10-CM | POA: Diagnosis not present

## 2013-07-18 DIAGNOSIS — K5080 Crohn's disease of both small and large intestine without complications: Secondary | ICD-10-CM | POA: Diagnosis not present

## 2013-07-18 MED ORDER — METRONIDAZOLE 500 MG PO TABS: 500.0000 mg | ORAL_TABLET | Freq: Two times a day (BID) | ORAL | Status: DC

## 2013-07-18 NOTE — Patient Instructions (Addendum)
We have sent the following medications to your pharmacy for you to pick up at your convenience: Flagyl 500 mg to take one tablet by mouth twice daily x 7 days.   Also start over the counter Florastor which is a probiotic, one tablet by mouth once daily x 1 month.   Discontinue your Pepto-Bismol.  Start taking over the counter Imodium four times a day.  Stay on Lactose free, Low-fat diet until your diarrhea stops.   Thank you for choosing me and [facility].  Pricilla Riffle. Dagoberto Ligas., MD., Marval Regal

## 2013-07-18 NOTE — Progress Notes (Signed)
    History of Present Illness: This is an 80 year old male accompanied by his wife. He complains of weight loss and diarrhea that began during treatment for pneumonia 2 months ago. His diarrhea has been persistent. He takes Pepto-Bismol frequently to control his diarrhea. He has not modified his diet. A GI pathogen panel was negative. CT scan of the abdomen/pelvis revealed mildly thickened neoterminal ileum which is a chronic finding. ESR elevated at 72. CRP was normal.  Current Medications, Allergies, Past Medical History, Past Surgical History, Family History and Social History were reviewed in [employer] record.  Physical Exam: General: Well developed , well nourished, thin, no acute distress Head: Normocephalic and atraumatic Eyes:  sclerae anicteric, EOMI Ears: Normal auditory acuity Mouth: No deformity or lesions Lungs: Clear throughout to auscultation Heart: Regular rate and rhythm; no murmurs, rubs or bruits Abdomen: Soft, non tender and non distended. No masses, hepatosplenomegaly or hernias noted. Normal Bowel sounds Musculoskeletal: Symmetrical with no gross deformities  Pulses:  Normal pulses noted Extremities: No clubbing, cyanosis, edema or deformities noted Neurological: Alert oriented x 4, grossly nonfocal Psychological:  Alert and cooperative. Normal mood and affect  Assessment and Recommendations:  1. Diarrhea and weight loss. Rule out bacterial overgrowth, gut flora changes related to antibiotic therapy, Crohn's. Begin a 7 day course of metronidazole. Begin Florastor twice a day for one month. Discontinue Pepto-Bismol. Begin Imodium 4 times a day when necessary. Follow lactose free, low-residue, low caffeine, low fat diet for now. Return office visit in 4 weeks. If symptoms have not resolved will discuss proceeding with colonoscopy.

## 2013-07-20 ENCOUNTER — Ambulatory Visit (INDEPENDENT_AMBULATORY_CARE_PROVIDER_SITE_OTHER): Payer: Medicare Other | Admitting: Licensed Clinical Social Worker

## 2013-07-20 ENCOUNTER — Telehealth: Payer: Self-pay | Admitting: Internal Medicine

## 2013-07-20 DIAGNOSIS — F4323 Adjustment disorder with mixed anxiety and depressed mood: Secondary | ICD-10-CM

## 2013-07-20 NOTE — Telephone Encounter (Signed)
Spoke with the pt and notified of recs per MW  He verbalized understanding  Nothing further needed  

## 2013-07-20 NOTE — Telephone Encounter (Signed)
Spoke with patient-he states he recently had PNA and has since cleared up. Pt wants to know if there is anything exercise and/or breathing exercises he can do to improve his breathing/ling function prior to next OV in July 2015.   MW please advise.

## 2013-07-20 NOTE — Telephone Encounter (Signed)
Sorry to hear that but there are no special breathing exercises, just paced increase in activity

## 2013-07-21 DIAGNOSIS — H524 Presbyopia: Secondary | ICD-10-CM | POA: Diagnosis not present

## 2013-07-21 DIAGNOSIS — H26499 Other secondary cataract, unspecified eye: Secondary | ICD-10-CM | POA: Diagnosis not present

## 2013-07-21 DIAGNOSIS — H35039 Hypertensive retinopathy, unspecified eye: Secondary | ICD-10-CM | POA: Diagnosis not present

## 2013-07-21 DIAGNOSIS — Z961 Presence of intraocular lens: Secondary | ICD-10-CM | POA: Diagnosis not present

## 2013-07-28 ENCOUNTER — Ambulatory Visit (INDEPENDENT_AMBULATORY_CARE_PROVIDER_SITE_OTHER): Payer: Medicare Other | Admitting: *Deleted

## 2013-07-28 DIAGNOSIS — I4891 Unspecified atrial fibrillation: Secondary | ICD-10-CM

## 2013-07-28 DIAGNOSIS — Z7901 Long term (current) use of anticoagulants: Secondary | ICD-10-CM | POA: Diagnosis not present

## 2013-07-28 LAB — POCT INR: INR: 2.3

## 2013-08-03 ENCOUNTER — Ambulatory Visit (INDEPENDENT_AMBULATORY_CARE_PROVIDER_SITE_OTHER): Payer: Medicare Other | Admitting: Licensed Clinical Social Worker

## 2013-08-03 DIAGNOSIS — F4323 Adjustment disorder with mixed anxiety and depressed mood: Secondary | ICD-10-CM

## 2013-08-11 ENCOUNTER — Other Ambulatory Visit: Payer: Self-pay | Admitting: Internal Medicine

## 2013-08-12 ENCOUNTER — Ambulatory Visit (INDEPENDENT_AMBULATORY_CARE_PROVIDER_SITE_OTHER): Payer: Medicare Other

## 2013-08-12 DIAGNOSIS — Z7901 Long term (current) use of anticoagulants: Secondary | ICD-10-CM

## 2013-08-12 DIAGNOSIS — I4891 Unspecified atrial fibrillation: Secondary | ICD-10-CM | POA: Diagnosis not present

## 2013-08-12 LAB — POCT INR: INR: 2.6

## 2013-08-16 ENCOUNTER — Other Ambulatory Visit: Payer: Self-pay | Admitting: Internal Medicine

## 2013-08-18 ENCOUNTER — Ambulatory Visit (INDEPENDENT_AMBULATORY_CARE_PROVIDER_SITE_OTHER): Payer: Medicare Other | Admitting: Gastroenterology

## 2013-08-18 ENCOUNTER — Encounter: Payer: Self-pay | Admitting: Gastroenterology

## 2013-08-18 VITALS — BP 112/70 | HR 68 | Ht 70.75 in | Wt 150.8 lb

## 2013-08-18 DIAGNOSIS — R197 Diarrhea, unspecified: Secondary | ICD-10-CM | POA: Diagnosis not present

## 2013-08-18 MED ORDER — METRONIDAZOLE 500 MG PO TABS: 500.0000 mg | ORAL_TABLET | Freq: Two times a day (BID) | ORAL | Status: DC

## 2013-08-18 NOTE — Progress Notes (Signed)
    History of Present Illness: This is an 80 year old male accompanied by his wife. He relates complete resolution of his diarrhea after taking a one week course of metronidazole. About 3 weeks later his diarrhea returned.   Current Medications, Allergies, Past Medical History, Past Surgical History, Family History and Social History were reviewed in [employer] record.  Physical Exam: General: Well developed , well nourished, thin, no acute distress Head: Normocephalic and atraumatic Eyes:  sclerae anicteric, EOMI Ears: Normal auditory acuity Mouth: No deformity or lesions Lungs: Clear throughout to auscultation Heart: Regular rate and rhythm; no murmurs, rubs or bruits Abdomen: Soft, non tender and non distended. No masses, hepatosplenomegaly or hernias noted. Normal Bowel sounds Musculoskeletal: Symmetrical with no gross deformities  Pulses:  Normal pulses noted Extremities: No clubbing, cyanosis, edema or deformities noted Neurological: Alert oriented x 4, grossly nonfocal Psychological:  Alert and cooperative. Normal mood and affect  Assessment and Recommendations:  1. Diarrhea and weight loss. Suspected bacterial overgrowth, gut flora changes related to antibiotic therapy, Crohn's. Begin a 14 day course of metronidazole. Continue a daily probiotic. Imodium 4 times a day when necessary. Follow lactose free, low-residue, low caffeine, low fat diet for now. If symptoms have not resolved will discuss proceeding with colonoscopy.

## 2013-08-18 NOTE — Patient Instructions (Signed)
We have sent the following medications to your pharmacy for you to pick up at your convenience: Flagyl.  Please call back if your symptoms have not improved after finishing the Flagyl.  Thank you for choosing me and [facility].  Pricilla Riffle. Dagoberto Ligas., MD., Marval Regal

## 2013-08-22 ENCOUNTER — Telehealth: Payer: Self-pay | Admitting: Cardiology

## 2013-08-22 ENCOUNTER — Ambulatory Visit (INDEPENDENT_AMBULATORY_CARE_PROVIDER_SITE_OTHER): Payer: Medicare Other | Admitting: *Deleted

## 2013-08-22 DIAGNOSIS — I4891 Unspecified atrial fibrillation: Secondary | ICD-10-CM

## 2013-08-22 DIAGNOSIS — Z7901 Long term (current) use of anticoagulants: Secondary | ICD-10-CM | POA: Diagnosis not present

## 2013-08-22 LAB — POCT INR: INR: 2.2

## 2013-08-22 NOTE — Telephone Encounter (Signed)
New message      Talk to someone in the coumadin clinic----have no idea what he want

## 2013-08-22 NOTE — Telephone Encounter (Signed)
Restarted Flagyl 500 mg tab 2  daily for 14 days started on Thursday for c diff . Appt made to be seen in coumadin clinic today at 2:15pm

## 2013-08-25 ENCOUNTER — Telehealth: Payer: Self-pay | Admitting: Internal Medicine

## 2013-08-25 NOTE — Telephone Encounter (Signed)
Form in Dr Gustavus Bryant look at to sign

## 2013-08-26 NOTE — Telephone Encounter (Signed)
Form completed  Pt aware I left this up front for pick up

## 2013-08-29 ENCOUNTER — Ambulatory Visit (INDEPENDENT_AMBULATORY_CARE_PROVIDER_SITE_OTHER): Payer: Medicare Other

## 2013-08-29 DIAGNOSIS — I4891 Unspecified atrial fibrillation: Secondary | ICD-10-CM

## 2013-08-29 DIAGNOSIS — Z7901 Long term (current) use of anticoagulants: Secondary | ICD-10-CM | POA: Diagnosis not present

## 2013-08-29 LAB — POCT INR: INR: 1.4

## 2013-09-07 ENCOUNTER — Ambulatory Visit (INDEPENDENT_AMBULATORY_CARE_PROVIDER_SITE_OTHER): Payer: Medicare Other | Admitting: Pharmacist

## 2013-09-07 DIAGNOSIS — I4891 Unspecified atrial fibrillation: Secondary | ICD-10-CM | POA: Diagnosis not present

## 2013-09-07 DIAGNOSIS — Z7901 Long term (current) use of anticoagulants: Secondary | ICD-10-CM | POA: Diagnosis not present

## 2013-09-07 LAB — POCT INR: INR: 1.6

## 2013-09-08 DIAGNOSIS — J438 Other emphysema: Secondary | ICD-10-CM | POA: Diagnosis not present

## 2013-09-08 DIAGNOSIS — R0602 Shortness of breath: Secondary | ICD-10-CM | POA: Diagnosis not present

## 2013-09-08 DIAGNOSIS — R918 Other nonspecific abnormal finding of lung field: Secondary | ICD-10-CM | POA: Diagnosis not present

## 2013-09-08 DIAGNOSIS — J8410 Pulmonary fibrosis, unspecified: Secondary | ICD-10-CM | POA: Diagnosis not present

## 2013-09-19 ENCOUNTER — Other Ambulatory Visit: Payer: Self-pay | Admitting: Internal Medicine

## 2013-09-21 ENCOUNTER — Ambulatory Visit (INDEPENDENT_AMBULATORY_CARE_PROVIDER_SITE_OTHER): Payer: Medicare Other | Admitting: Pharmacist

## 2013-09-21 DIAGNOSIS — Z7901 Long term (current) use of anticoagulants: Secondary | ICD-10-CM | POA: Diagnosis not present

## 2013-09-21 DIAGNOSIS — I4891 Unspecified atrial fibrillation: Secondary | ICD-10-CM

## 2013-09-21 LAB — POCT INR: INR: 1.6

## 2013-09-22 ENCOUNTER — Encounter: Payer: Self-pay | Admitting: Internal Medicine

## 2013-09-22 ENCOUNTER — Ambulatory Visit (INDEPENDENT_AMBULATORY_CARE_PROVIDER_SITE_OTHER): Payer: Medicare Other | Admitting: Internal Medicine

## 2013-09-22 VITALS — BP 116/68 | HR 83 | Temp 98.6°F | Wt 148.0 lb

## 2013-09-22 DIAGNOSIS — J309 Allergic rhinitis, unspecified: Secondary | ICD-10-CM | POA: Diagnosis not present

## 2013-09-22 DIAGNOSIS — J300 Vasomotor rhinitis: Secondary | ICD-10-CM

## 2013-09-22 MED ORDER — AZELASTINE HCL 0.1 % NA SOLN: 2.0000 | Freq: Every day | NASAL | Status: DC

## 2013-09-22 NOTE — Patient Instructions (Addendum)
Nasacort OTC nasal sprays: 2 sprays in each side of the nose every morning Astelin nasal spray (prescription sent) : 2 sprays in each side of the nose every night   Next visit is for routine check up by 10-2013 No need to come back fasting Please make an appointment

## 2013-09-22 NOTE — Progress Notes (Signed)
Subjective:    Patient ID: Brian [name], male    DOB: Jan 31, 1929, 80 y.o.   MRN: [id number]  DOS:  09/22/2013 Type of  Visit: acute visit, nasal congestion History: Many years history of nose congestion, has a difficult time breathing at night, uses an OTC spray (?name) and warm water at night and it make him feel better. + + Postnasal dripping. Symptoms are definitely worse when he eats particularly hot or spicy foods or when he drinks coffee.   ROS Denies fever or chills No sinus pain or discharge No watery eyes, itchy eyes or itchy nose. No frequent sneezing. Sometimes has a dry mouth and a hard time eating dry foods you   Past Medical History  Diagnosis Date  . Muscle tremor     saw neurology remotely elsewhere, was Rx inderal  . Crohn's ileocolitis   . Osteopenia     per DEXA 11/2008  . Hypertension 11/11    mild  . Hemorrhoids   . Hypertrophy of prostate     w/o UR obst & oth luts  . Peripheral neuropathy     h/o neg w/u  . Osteoarthritis     ankle,right  . ED (erectile dysfunction)   . Hypertriglyceridemia   . B12 deficiency   . COPD (chronic obstructive pulmonary disease)     recent dx--no acute problems  . Cancer     squamous cell skin cancers-tx'd q71yrs  . Hiatal hernia 1956    found while in service  . Anxiety   . Atrial fibrillation with RVR   . Long term (current) use of anticoagulants 02/21/2011  . Serrated adenoma of colon 03/2010  . Pneumonia   . Renal cyst     bilateral    Past Surgical History  Procedure Laterality Date  . Resection of distal ileum and cecum w/ appendectomy      18-inch small intestine, 1984 for Crohn's disease  . Colon surgery  1983    has chrohn's  . Appendectomy  1983  . Cholecystectomy  02/17/2011    Procedure: LAPAROSCOPIC CHOLECYSTECTOMY WITH INTRAOPERATIVE CHOLANGIOGRAM;  Surgeon: Edward Jolly, MD;  Location: [facility];  Service: General;  Laterality: N/A;  . Cataract extraction      bilateral ~ 2007     History   Social History  . Marital Status: Married    Spouse Name: Dalene Seltzer    Number of Children: 4  . Years of Education: N/A   Occupational History  . Retired   . RETIRED    Social History Main Topics  . Smoking status: Former Smoker -- 1.00 packs/day for 40 years    Types: Cigarettes, Pipe    Quit date: 03/24/1990  . Smokeless tobacco: Never Used  . Alcohol Use: No     Comment:    . Drug Use: No  . Sexual Activity: Not Currently    Birth Control/ Protection: None   Other Topics Concern  . Not on file   Social History Narrative   Married, lives w/ wife         Medication List       This list is accurate as of: 09/22/13 11:59 PM.  Always use your most recent med list.               acetaminophen 325 MG tablet  Commonly known as:  TYLENOL  Take 650 mg by mouth as needed for pain.     amLODipine 5 MG tablet  Commonly known as:  NORVASC  TAKE 1 TABLET DAILY     azelastine 0.1 % nasal spray  Commonly known as:  ASTELIN  Place 2 sprays into both nostrils at bedtime.     busPIRone 15 MG tablet  Commonly known as:  BUSPAR  1 tablet every am and 1/2 tablet every evening     BYSTOLIC 10 MG tablet  Generic drug:  nebivolol  TAKE 1 TABLET DAILY     CLARITIN 10 MG tablet  Generic drug:  loratadine  Take 10 mg by mouth daily as needed.     clonazePAM 0.5 MG tablet  Commonly known as:  KLONOPIN  Take 1 tablet (0.5 mg total) by mouth 3 (three) times daily as needed for anxiety.     fexofenadine 180 MG tablet  Commonly known as:  ALLEGRA  Take 180 mg by mouth daily.     gemfibrozil 600 MG tablet  Commonly known as:  LOPID  TAKE 1 TABLET TWICE A DAY     mesalamine 250 MG CR capsule  Commonly known as:  PENTASA  Take 2 capsules (500 mg total) by mouth 2 (two) times daily.     NASCOBAL 500 MCG/0.1ML Soln  Generic drug:  Cyanocobalamin  USE ONE SPRAY NASALLY AS DIRECTED PER WEEK     psyllium 58.6 % powder  Commonly known as:  METAMUCIL  Take 1  packet by mouth as needed. 1 tbs daily     warfarin 2 MG tablet  Commonly known as:  COUMADIN  TAKE 1 TABLET AS DIRECTED           Objective:   Physical Exam BP 116/68  Pulse 83  Temp(Src) 98.6 F (37 C) (Oral)  Wt 148 lb (67.132 kg)  SpO2 93% General -- alert, well-developed, NAD.   HEENT-- Not pale. TMs normal, throat symmetric, no redness or discharge. Face symmetric, sinuses not tender to palpation. Nose   Congested. Nostrils normal to inspection, he does have a very small clot at the medial aspect of the left nostril Lungs -- normal respiratory effort, no intercostal retractions, no accessory muscle use, and slt decreased breath sounds.  Heart-- normal rate, regular rhythm, no murmur.  Psych-- Cognition and judgment appear intact. Cooperative with normal attention span and concentration. No anxious or depressed appearing.     Assessment & Plan:   Dry mouth, Occasional problems swallowing dry foods, recommend consistent use of fluids when he eats

## 2013-09-22 NOTE — Assessment & Plan Note (Signed)
Symptoms most likely due to vasomotor rhinitis. He has been taking Nascobal mostly on the right nostril once a week for many years. He also noted occasional left nostril bleeding when he blows his nose. Plan: consistent use of Flonase and Astelin We'll see how he does, may need to hold Nascobal for a few weeks to see if that make him better if he does not respond to medication.

## 2013-09-22 NOTE — Progress Notes (Signed)
Pre-visit discussion using our clinic review tool. No additional management support is needed unless otherwise documented below in the visit note.  

## 2013-10-04 ENCOUNTER — Ambulatory Visit: Payer: Self-pay | Admitting: Internal Medicine

## 2013-10-04 DIAGNOSIS — J84112 Idiopathic pulmonary fibrosis: Secondary | ICD-10-CM | POA: Diagnosis not present

## 2013-10-05 ENCOUNTER — Ambulatory Visit (INDEPENDENT_AMBULATORY_CARE_PROVIDER_SITE_OTHER): Payer: Medicare Other | Admitting: Pharmacist

## 2013-10-05 ENCOUNTER — Ambulatory Visit: Payer: Self-pay | Admitting: Internal Medicine

## 2013-10-05 DIAGNOSIS — Z7901 Long term (current) use of anticoagulants: Secondary | ICD-10-CM | POA: Diagnosis not present

## 2013-10-05 DIAGNOSIS — I4891 Unspecified atrial fibrillation: Secondary | ICD-10-CM

## 2013-10-05 LAB — POCT INR: INR: 2

## 2013-10-06 ENCOUNTER — Other Ambulatory Visit: Payer: Self-pay

## 2013-10-14 ENCOUNTER — Telehealth (HOSPITAL_COMMUNITY): Payer: Self-pay

## 2013-10-14 DIAGNOSIS — J84112 Idiopathic pulmonary fibrosis: Secondary | ICD-10-CM | POA: Diagnosis not present

## 2013-10-14 NOTE — Telephone Encounter (Signed)
I have called and left a message with Drelyn to inquire about participation in Pulmonary Rehab. Will send letter in mail and follow up.

## 2013-10-17 ENCOUNTER — Telehealth (HOSPITAL_COMMUNITY): Payer: Self-pay

## 2013-10-17 NOTE — Telephone Encounter (Signed)
Called patient regarding entrance to Pulmonary Rehab.  Patient states that they are interested in attending the program.  Brian [name] is going to verify insurance coverage and follow up.

## 2013-10-20 ENCOUNTER — Telehealth (HOSPITAL_COMMUNITY): Payer: Self-pay

## 2013-10-20 NOTE — Telephone Encounter (Signed)
Called to remind patient about his 10/24/13 appointment with Pulmonary Rehab.

## 2013-10-24 ENCOUNTER — Encounter (HOSPITAL_COMMUNITY)
Admission: RE | Admit: 2013-10-24 | Discharge: 2013-10-24 | Disposition: A | Discharge status: Home / Self Care | Payer: Medicare Other | Source: Ambulatory Visit | Attending: Internal Medicine | Admitting: Internal Medicine

## 2013-10-24 ENCOUNTER — Encounter (HOSPITAL_COMMUNITY): Payer: Self-pay

## 2013-10-24 VITALS — BP 120/74 | HR 50 | Resp 18 | Ht 71.25 in | Wt 148.1 lb

## 2013-10-24 DIAGNOSIS — J449 Chronic obstructive pulmonary disease, unspecified: Secondary | ICD-10-CM | POA: Insufficient documentation

## 2013-10-24 DIAGNOSIS — J4489 Other specified chronic obstructive pulmonary disease: Secondary | ICD-10-CM | POA: Insufficient documentation

## 2013-10-24 DIAGNOSIS — Z5189 Encounter for other specified aftercare: Secondary | ICD-10-CM | POA: Diagnosis not present

## 2013-10-24 DIAGNOSIS — J8410 Pulmonary fibrosis, unspecified: Secondary | ICD-10-CM

## 2013-10-24 NOTE — Progress Notes (Signed)
Brian [name] 80 y.o. male Pulmonary Rehab Orientation Note Patient arrived today in [facility] for orientation to Pulmonary Rehab. He ambulated independently with his cane accompanied by his wife, from [place] parking. He does not carry portable oxygen. Per pt, he uses oxygen never. Color good, skin warm and dry. Patient is oriented to time and place. Patient's medical history and medications reviewed. Heart rate is bradycardic, breath sounds clear to auscultation in upper lobes, no wheezes or rhonci. Fine crackles heard in the bases bilaterally, right greater than left. Grip strength equal, strong. Distal pulses palpable. No edema noted. Patient reports he does take medications as prescribed. Patient states he follows a Regular diet. The patient reports no specific efforts to gain or lose weight. He did verbalize that he has had a significant amount of weight loss earlier this year with recurrent episodes of C-diff post antibiotic therapy for pneumonia. Patient stated he has had a hard time regaining the weight he previously lost. It is documented in medical records from [facility] that patient had a 25lb weight loss. Patient's weight will be monitored closely. Demonstration and practice of PLB using pulse oximeter. Patient able to return demonstration satisfactorily. Safety and hand hygiene in the exercise area reviewed with patient. Patient voices understanding of the information reviewed. Department expectations discussed with patient and achievable goals were set. The patient shows enthusiasm about attending the program and we look forward to working with this nice gentleman. The patient is scheduled for a 6 min walk test on Thursday 8/6 at 3:45 and to begin exercise on Tuesday 8/11 at 1:30.   45 minutes was spent on a variety of activities such as assessment of the patient, obtaining baseline data including height, weight, BMI, and grip strength, verifying medical history, allergies, and current  medications, and teaching patient strategies for performing tasks with less respiratory effort with emphasis on pursed lip breathing.

## 2013-10-27 ENCOUNTER — Encounter (HOSPITAL_COMMUNITY)
Admission: RE | Admit: 2013-10-27 | Discharge: 2013-10-27 | Disposition: A | Discharge status: Home / Self Care | Payer: Medicare Other | Source: Ambulatory Visit | Attending: Internal Medicine | Admitting: Internal Medicine

## 2013-10-27 ENCOUNTER — Ambulatory Visit (INDEPENDENT_AMBULATORY_CARE_PROVIDER_SITE_OTHER): Payer: Medicare Other | Admitting: *Deleted

## 2013-10-27 DIAGNOSIS — Z7901 Long term (current) use of anticoagulants: Secondary | ICD-10-CM | POA: Diagnosis not present

## 2013-10-27 DIAGNOSIS — I4891 Unspecified atrial fibrillation: Secondary | ICD-10-CM | POA: Diagnosis not present

## 2013-10-27 DIAGNOSIS — Z5189 Encounter for other specified aftercare: Secondary | ICD-10-CM | POA: Diagnosis not present

## 2013-10-27 LAB — POCT INR: INR: 2.4

## 2013-10-27 NOTE — Progress Notes (Signed)
Brian [name] completed a Six-Minute Walk Test on 10/27/13 . Brian [name] walked 973 feet with 0 breaks.  The patient's lowest oxygen saturation was 86% -- the patient has never been on oxygen before so I started him off on room air- when the patient started to desaturate to mid 80's I put him on 3 liters and then 4 liters of oxygen--this got his oxygen saturation up to 93% , highest heart rate was 93bpm , and highest blood pressure was 142/64. The patient was on 4 liters of oxygen with a nasal cannula. Brian [name] stated that nothing hindered their walk test.  The patient stated that he was not short of breath and could not feel his oxygen saturation decreasing.

## 2013-11-01 ENCOUNTER — Encounter (HOSPITAL_COMMUNITY)
Admission: RE | Admit: 2013-11-01 | Discharge: 2013-11-01 | Disposition: A | Discharge status: Home / Self Care | Payer: Medicare Other | Source: Ambulatory Visit | Attending: Internal Medicine | Admitting: Internal Medicine

## 2013-11-01 DIAGNOSIS — Z5189 Encounter for other specified aftercare: Secondary | ICD-10-CM | POA: Diagnosis not present

## 2013-11-01 NOTE — Progress Notes (Signed)
Today, [name] exercised at [employer]. [facility]. Service time was from 1330 to 1515.  The patient exercised for more than 31 minutes performing aerobic, strengthening, and stretching exercises. Oxygen saturation, heart rate, blood pressure, rate of perceived exertion, and shortness of breath were all monitored before, during, and after exercise. Brian [name] presented with no problems at today's exercise session however he did desaturate to on the airdyne bike and required O2 at 2L.  There was no workload change during today's exercise session.  Pre-exercise vitals:   Weight kg: 67.1   Liters of O2: ra   SpO2: 97   HR: 73   BP: 110/68   CBG: na  Exercise vitals:   Highest heartrate:  87   Lowest oxygen saturation: 88 up to 93 with increase in O2 from 3L to 4L   Highest blood pressure: 134/62   Liters of 02: 4L  Post-exercise vitals:   SpO2: 99   HR: 65   BP: 122/72   Liters of O2: 2L   CBG: na  Dr. Brand Males, Medical Director Dr. Frederic Jericho is immediately available during today's Pulmonary Rehab session for Brian [name] on 11/01/2013 at 1330 class time.

## 2013-11-03 ENCOUNTER — Encounter (HOSPITAL_COMMUNITY)
Admission: RE | Admit: 2013-11-03 | Discharge: 2013-11-03 | Disposition: A | Discharge status: Home / Self Care | Payer: Medicare Other | Source: Ambulatory Visit | Attending: Internal Medicine | Admitting: Internal Medicine

## 2013-11-03 DIAGNOSIS — Z5189 Encounter for other specified aftercare: Secondary | ICD-10-CM | POA: Diagnosis not present

## 2013-11-03 NOTE — Progress Notes (Signed)
Today, Raji exercised at [employer]. [facility]. Service time was from 1330 to 1530.  The patient exercised for more than 31 minutes performing aerobic, strengthening, and stretching exercises. Oxygen saturation, heart rate, blood pressure, rate of perceived exertion, and shortness of breath were all monitored before, during, and after exercise. Cassady presented with no problems at today's exercise session. Patient attended pulmonary medication class.   There was one workload change during today's exercise session.  Pre-exercise vitals:   Weight kg: 67.0   Liters of O2: 2   SpO2: 97   HR: 65   BP: 98/60   CBG: NA  Exercise vitals:   Highest heartrate:  66   Lowest oxygen saturation: 100   Highest blood pressure: 138/70   Liters of 02: 3  Post-exercise vitals:   SpO2: 99   HR: 65   BP: 114/60   Liters of O2: 2   CBG: NA Dr. Brand Males, Medical Director Dr. Maryland Pink is immediately available during today's Pulmonary Rehab session for [name] on 11/03/2013 at 1330 class time.

## 2013-11-04 ENCOUNTER — Encounter: Payer: Self-pay | Admitting: Internal Medicine

## 2013-11-04 ENCOUNTER — Ambulatory Visit (INDEPENDENT_AMBULATORY_CARE_PROVIDER_SITE_OTHER): Payer: Medicare Other | Admitting: Internal Medicine

## 2013-11-04 VITALS — BP 132/64 | HR 65 | Temp 97.4°F | Wt 150.4 lb

## 2013-11-04 DIAGNOSIS — F411 Generalized anxiety disorder: Secondary | ICD-10-CM | POA: Diagnosis not present

## 2013-11-04 DIAGNOSIS — J84112 Idiopathic pulmonary fibrosis: Secondary | ICD-10-CM | POA: Diagnosis not present

## 2013-11-04 DIAGNOSIS — E559 Vitamin D deficiency, unspecified: Secondary | ICD-10-CM

## 2013-11-04 DIAGNOSIS — I10 Essential (primary) hypertension: Secondary | ICD-10-CM

## 2013-11-04 DIAGNOSIS — J8410 Pulmonary fibrosis, unspecified: Secondary | ICD-10-CM | POA: Diagnosis not present

## 2013-11-04 MED ORDER — CLONAZEPAM 0.5 MG PO TABS: 0.5000 mg | ORAL_TABLET | Freq: Three times a day (TID) | ORAL | Status: DC | PRN

## 2013-11-04 MED ORDER — NEBIVOLOL HCL 10 MG PO TABS: ORAL_TABLET | ORAL | Status: DC

## 2013-11-04 NOTE — Assessment & Plan Note (Signed)
Was eval at [place] 2 months ago, doing pulmonary rehabilitation, they're planning to prescribed medication for his fibrosis, name of the med ?

## 2013-11-04 NOTE — Assessment & Plan Note (Signed)
Seems well-controlled, refill bystolic

## 2013-11-04 NOTE — Assessment & Plan Note (Signed)
UDS 05-2013 low risk, refill clonazepam, he usually takes half tablet 2 or 3 times a day w/ t good results

## 2013-11-04 NOTE — Progress Notes (Signed)
Pre-visit discussion using our clinic review tool. No additional management support is needed unless otherwise documented below in the visit note.  

## 2013-11-04 NOTE — Assessment & Plan Note (Signed)
Vitamin D checked 09/08/2013 at [facility] was 9.0. On oral supplements

## 2013-11-04 NOTE — Patient Instructions (Signed)
   Next visit is for a physical exam by October or November of this year, fasting Please make an appointment

## 2013-11-04 NOTE — Progress Notes (Signed)
Subjective:    Patient ID: Brian [name], male    DOB: 02-04-1929, 80 y.o.   MRN: [id number]  DOS:  11/04/2013 Type of visit - description: routine History: In general feeling well. Anxiety well controlled needs a refill on clonazepam Pulmonary fibrosis, saw a doctor at  [facility], see assessment and plan Labs from [facility] 09/08/2013: CBC normal except for a white count of 11.7, potassium 3.8, creatinine 1.2, vitamin D 9. BP is well-controlled in the ambulatory setting, needs prescription for bystolic   ROS Denies chest pain, no palpitations No nausea, vomiting, diarrhea or blood in the stools  Past Medical History  Diagnosis Date  . Muscle tremor     saw neurology remotely elsewhere, was Rx inderal  . Crohn's ileocolitis   . Osteopenia     per DEXA 11/2008  . Hypertension 11/11    mild  . Hemorrhoids   . Hypertrophy of prostate     w/o UR obst & oth luts  . Peripheral neuropathy     h/o neg w/u  . Osteoarthritis     ankle,right  . ED (erectile dysfunction)   . Hypertriglyceridemia   . B12 deficiency   . COPD (chronic obstructive pulmonary disease)     recent dx--no acute problems  . Cancer     squamous cell skin cancers-tx'd q38yrs  . Hiatal hernia 1956    found while in service  . Anxiety   . Atrial fibrillation with RVR   . Long term (current) use of anticoagulants 02/21/2011  . Serrated adenoma of colon 03/2010  . Pneumonia   . Renal cyst     bilateral    Past Surgical History  Procedure Laterality Date  . Resection of distal ileum and cecum w/ appendectomy      18-inch small intestine, 1984 for Crohn's disease  . Colon surgery  1983    has chrohn's  . Appendectomy  1983  . Cholecystectomy  02/17/2011    Procedure: LAPAROSCOPIC CHOLECYSTECTOMY WITH INTRAOPERATIVE CHOLANGIOGRAM;  Surgeon: Edward Jolly, MD;  Location: [facility];  Service: General;  Laterality: N/A;  . Cataract extraction      bilateral ~ 2007    History   Social History  . Marital  Status: Married    Spouse Name: Dalene Seltzer    Number of Children: 4  . Years of Education: N/A   Occupational History  . Retired   . RETIRED    Social History Main Topics  . Smoking status: Former Smoker -- 1.00 packs/day for 40 years    Types: Cigarettes, Pipe    Quit date: 03/24/1990  . Smokeless tobacco: Never Used  . Alcohol Use: No     Comment:    . Drug Use: No  . Sexual Activity: Not Currently    Birth Control/ Protection: None   Other Topics Concern  . Not on file   Social History Narrative   Married, lives w/ wife         Medication List       This list is accurate as of: 11/04/13 11:59 PM.  Always use your most recent med list.               acetaminophen 325 MG tablet  Commonly known as:  TYLENOL  Take 650 mg by mouth as needed for pain.     amLODipine 5 MG tablet  Commonly known as:  NORVASC  TAKE 1 TABLET DAILY     azelastine 0.1 % nasal spray  Commonly known as:  ASTELIN  Place 2 sprays into both nostrils at bedtime.     busPIRone 15 MG tablet  Commonly known as:  BUSPAR  1 tablet every am and 1/2 tablet every evening     clonazePAM 0.5 MG tablet  Commonly known as:  KLONOPIN  Take 1 tablet (0.5 mg total) by mouth 3 (three) times daily as needed for anxiety.     fexofenadine 180 MG tablet  Commonly known as:  ALLEGRA  Take 180 mg by mouth daily.     gemfibrozil 600 MG tablet  Commonly known as:  LOPID  TAKE 1 TABLET TWICE A DAY     mesalamine 250 MG CR capsule  Commonly known as:  PENTASA  Take 2 capsules (500 mg total) by mouth 2 (two) times daily.     NASCOBAL 500 MCG/0.1ML Soln  Generic drug:  Cyanocobalamin  USE ONE SPRAY NASALLY AS DIRECTED PER WEEK     nebivolol 10 MG tablet  Commonly known as:  BYSTOLIC  TAKE 1 TABLET DAILY     Vitamin D (Ergocalciferol) 50000 UNITS Caps capsule  Commonly known as:  DRISDOL  Take 50,000 Units by mouth every 7 (seven) days.     warfarin 2 MG tablet  Commonly known as:  COUMADIN  TAKE 1  TABLET AS DIRECTED           Objective:   Physical Exam BP 132/64  Pulse 65  Temp(Src) 97.4 F (36.3 C) (Oral)  Wt 150 lb 6 oz (68.21 kg)  SpO2 93% General -- alert, well-developed, NAD.   Lungs -- normal respiratory effort, no intercostal retractions, no accessory muscle use, and normal breath sounds.  Heart-- normal rate, regular rhythm, no murmur.  Extremities-- no pretibial edema bilaterally  Neurologic--  alert & oriented X3. Speech normal, gait appropriate for age, strength symmetric and appropriate for age.  Psych-- seems in good spirits     Assessment & Plan:

## 2013-11-08 ENCOUNTER — Encounter: Payer: Self-pay | Admitting: Internal Medicine

## 2013-11-08 ENCOUNTER — Encounter (HOSPITAL_COMMUNITY)
Admission: RE | Admit: 2013-11-08 | Discharge: 2013-11-08 | Disposition: A | Discharge status: Home / Self Care | Payer: Medicare Other | Source: Ambulatory Visit | Attending: Internal Medicine | Admitting: Internal Medicine

## 2013-11-08 DIAGNOSIS — Z5189 Encounter for other specified aftercare: Secondary | ICD-10-CM | POA: Diagnosis not present

## 2013-11-08 NOTE — Progress Notes (Signed)
Today, Zaivion exercised at [facility]. Service time was from 1330 to 1500.  The patient exercised for more than 31 minutes performing aerobic, strengthening, and stretching exercises. Oxygen saturation, heart rate, blood pressure, rate of perceived exertion, and shortness of breath were all monitored before, during, and after exercise. Brian [name] presented with no problems at today's exercise session.   There was a workload change during today's exercise session.  Pre-exercise vitals:   Weight kg: 67.4   Liters of O2: 2L   SpO2: 97   HR: 53   BP: 122/80   CBG: na  Exercise vitals:   Highest heartrate:  84   Lowest oxygen saturation: 95   Highest blood pressure: 130/60   Liters of 02: 4L  Post-exercise vitals:   SpO2: 100   HR: 60   BP: 112/60   Liters of O2: 4L   CBG: na  Dr. Brand Males, Medical Director Dr. Maryland Pink is immediately available during today's Pulmonary Rehab session for [name] on 11/08/2013 at 1330 class time.

## 2013-11-10 ENCOUNTER — Encounter (HOSPITAL_COMMUNITY)
Admission: RE | Admit: 2013-11-10 | Discharge: 2013-11-10 | Disposition: A | Discharge status: Home / Self Care | Payer: Medicare Other | Source: Ambulatory Visit | Attending: Internal Medicine | Admitting: Internal Medicine

## 2013-11-10 DIAGNOSIS — Z5189 Encounter for other specified aftercare: Secondary | ICD-10-CM | POA: Diagnosis not present

## 2013-11-10 NOTE — Progress Notes (Signed)
Today, Limuel exercised at [facility]. Service time was from 1330 to 1530.  The patient exercised for more than 31 minutes performing aerobic, strengthening, and stretching exercises. Oxygen saturation, heart rate, blood pressure, rate of perceived exertion, and shortness of breath were all monitored before, during, and after exercise. Jeanclaude presented with no problems at today's exercise session. Kylie also attended an education session on home oxygen use.  There was no workload change during today's exercise session.  Pre-exercise vitals:   Weight kg: 68.1   Liters of O2: 2L   SpO2: 100   HR: 65   BP: 124/64   CBG: na  Exercise vitals:   Highest heartrate:  87   Lowest oxygen saturation: 92   Highest blood pressure: 140/64   Liters of 02: 4L  Post-exercise vitals:   SpO2: 100   HR: 55   BP: 122/64   Liters of O2: 2L   CBG: na  Dr. Brand Males, Medical Director Dr. Algis Liming is immediately available during today's Pulmonary Rehab session for [name] on 11/10/2013 at 1330 class time.

## 2013-11-15 ENCOUNTER — Encounter (HOSPITAL_COMMUNITY)
Admission: RE | Admit: 2013-11-15 | Discharge: 2013-11-15 | Disposition: A | Discharge status: Home / Self Care | Payer: Medicare Other | Source: Ambulatory Visit | Attending: Internal Medicine | Admitting: Internal Medicine

## 2013-11-15 DIAGNOSIS — Z5189 Encounter for other specified aftercare: Secondary | ICD-10-CM | POA: Diagnosis not present

## 2013-11-15 NOTE — Progress Notes (Signed)
Brian [name] 80 y.o. male Nutrition Note Spoke with pt. Pt weight and BMI < 21 is a concern due to pulmonary disease. Pt wt in EMR reviewed. Pt with slow, chronic wt loss over the past year and a half. Pt wt was 170 lb 06/01/12 and is now 148.9 lb. Pt wt is down 21.1 lb over 12-18 months. Pt feels pna in 04/2013 and then dx of C-diff accelerated his wt loss. Pt reports his Crohn's has been well-managed recently. Pt states his appetite is good. Pt agreeable to drinking 1-2 high calorie, high protein supplements daily. Pt does not avoid salty food; uses canned/ convenience food and eats out frequently.  Pt adds salt to food.  The role of sodium in lung disease reviewed with pt. Pt expressed understanding of the information reviewed.  Nutrition Diagnosis   Food-and nutrition-related knowledge deficit related to lack of exposure to information as related to diagnosis of pulmonary disease   Increased energy expenditure related to increased energy requirements during recent illnesses as evidenced by BMI <21 and recent h/o wt loss.  Nutrition Rx/Est. Daily Nutrition Needs for: ? wt gain 2600-3100 Kcal  105-135 gm protein   1500 mg or less sodium      Nutrition Intervention   Pt's individual nutrition plan and goals reviewed with pt.   Benefits of adopting healthy eating habits discussed when pt's Rate Your Plate reviewed.   Pt to add 1-2 Boost Plus or Ensure Plus supplements daily   Pt to attend the Nutrition and Lung Disease class   Continual client-centered nutrition education by RD, as part of interdisciplinary care. Goal(s) 1. Pt to identify and limit food sources of sodium. 2. The pt will recognize symptoms that can interfere with adequate oral intake, such as shortness of breath, N/V, early satiety, fatigue, ability to secure and prepare food, taste and smell changes, chewing/swallowing difficulties, and/ or pain when eating. 3. The pt will consume high-energy, high-nutrient dense beverages when  necessary to compensate for decreased oral intake of solid foods. 4. Identify food quantities necessary to achieve wt gain at graduation from pulmonary rehab. Monitor and Evaluate progress toward nutrition goal with team.   Derek Mound, M.Ed, RD, LDN, CDE 11/15/2013 2:41 PM

## 2013-11-15 NOTE — Progress Notes (Signed)
Today, Mariah exercised at [facility]. Service time was from 1330 to 1515.  The patient exercised for more than 31 minutes performing aerobic, strengthening, and stretching exercises. Oxygen saturation, heart rate, blood pressure, rate of perceived exertion, and shortness of breath were all monitored before, during, and after exercise. Cullan presented with no problems at today's exercise session.   There was no workload change during today's exercise session.  Pre-exercise vitals:   Weight kg: 67.7   Liters of O2: 2   SpO2: 99   HR: 57   BP: 120/60   CBG: NA  Exercise vitals:   Highest heartrate:  95   Lowest oxygen saturation: 95   Highest blood pressure: 142/60   Liters of 02: 4  Post-exercise vitals:   SpO2: 100   HR: 63   BP: 120/62   Liters of O2: 2   CBG: NA Dr. Brand Males, Medical Director Dr. Algis Liming is immediately available during today's Pulmonary Rehab session for [name] on 11/15/2013 at 1330 class time.

## 2013-11-17 ENCOUNTER — Other Ambulatory Visit: Payer: Self-pay | Admitting: Internal Medicine

## 2013-11-17 ENCOUNTER — Encounter (HOSPITAL_COMMUNITY)
Admission: RE | Admit: 2013-11-17 | Discharge: 2013-11-17 | Disposition: A | Discharge status: Home / Self Care | Payer: Medicare Other | Source: Ambulatory Visit | Attending: Internal Medicine | Admitting: Internal Medicine

## 2013-11-17 DIAGNOSIS — Z5189 Encounter for other specified aftercare: Secondary | ICD-10-CM | POA: Diagnosis not present

## 2013-11-17 NOTE — Progress Notes (Signed)
Today, Ras exercised at [facility]. Service time was from 1230 to 1430.  The patient exercised for more than 31 minutes performing aerobic, strengthening, and stretching exercises. Oxygen saturation, heart rate, blood pressure, rate of perceived exertion, and shortness of breath were all monitored before, during, and after exercise. Koray presented with no problems at today's exercise session. Patient attended the MD lecture class today.  There was no workload change during today's exercise session.  Pre-exercise vitals:   Weight kg: 67.1   Liters of O2: 2   SpO2: 99   HR: 54   BP: 124/64   CBG: NA  Exercise vitals:   Highest heartrate:  69   Lowest oxygen saturation: 96   Highest blood pressure: 124/64   Liters of 02: 4  Post-exercise vitals:   SpO2: 99   HR: 66   BP: 126/60   Liters of O2: 2   CBG: NA Dr. Brand Males, Medical Director Dr. Broadus John is immediately available during today's Pulmonary Rehab session for [name] on 11/17/2013 at 1330 class time.

## 2013-11-22 ENCOUNTER — Encounter (HOSPITAL_COMMUNITY)
Admission: RE | Admit: 2013-11-22 | Discharge: 2013-11-22 | Disposition: A | Discharge status: Home / Self Care | Payer: Medicare Other | Source: Ambulatory Visit | Attending: Internal Medicine | Admitting: Internal Medicine

## 2013-11-22 DIAGNOSIS — J4489 Other specified chronic obstructive pulmonary disease: Secondary | ICD-10-CM | POA: Insufficient documentation

## 2013-11-22 DIAGNOSIS — J449 Chronic obstructive pulmonary disease, unspecified: Secondary | ICD-10-CM | POA: Insufficient documentation

## 2013-11-22 DIAGNOSIS — Z5189 Encounter for other specified aftercare: Secondary | ICD-10-CM | POA: Insufficient documentation

## 2013-11-22 NOTE — Progress Notes (Signed)
Brian [name] 80 y.o. male Nutrition Note Spoke with pt and pt's wife. Pt wt down 1.1 kg today (66.6 kg/146.5 lb) "despite eating more for the past 2-3 days." Pt states he's been eating "everything you told me to eat." Pt has been eating yogurt, regular pudding or an Ensure believing those were high calorie choices. High calorie foods clarified. Pt plans to switch to Ensure Plus. If pt wt continues to decline, will notify pt's PCP re: ? If pt may benefit from an appetite stimulant.   Nutrition Diagnosis   Food-and nutrition-related knowledge deficit related to lack of exposure to information as related to diagnosis of pulmonary disease   Increased energy expenditure related to increased energy requirements during recent illnesses as evidenced by BMI <21 and recent h/o wt loss.  Nutrition Rx/Est. Daily Nutrition Needs for: ? wt gain 2600-3100 Kcal  105-135 gm protein   1500 mg or less sodium      Nutrition Intervention   Pt's individual nutrition plan and goals reviewed with pt.   Pt to add 1-2 Ensure Plus supplements daily   Pt to attend the Nutrition and Lung Disease class   Continual client-centered nutrition education by RD, as part of interdisciplinary care. Goal(s) 1. Pt to identify and limit food sources of sodium. 2. The pt will recognize symptoms that can interfere with adequate oral intake, such as shortness of breath, N/V, early satiety, fatigue, ability to secure and prepare food, taste and smell changes, chewing/swallowing difficulties, and/ or pain when eating. 3. The pt will consume high-energy, high-nutrient dense beverages when necessary to compensate for decreased oral intake of solid foods. 4. Identify food quantities necessary to achieve wt gain at graduation from pulmonary rehab. Monitor and Evaluate progress toward nutrition goal with team.   Derek Mound, M.Ed, RD, LDN, CDE 11/22/2013 2:44 PM

## 2013-11-22 NOTE — Progress Notes (Signed)
Today, Pawan exercised at [facility]. Service time was from 1330 to 1515.  The patient exercised for more than 31 minutes performing aerobic, strengthening, and stretching exercises. Oxygen saturation, heart rate, blood pressure, rate of perceived exertion, and shortness of breath were all monitored before, during, and after exercise. Rusty presented with no problems at today's exercise session.   There was no workload change during today's exercise session.  Pre-exercise vitals:   Weight kg: 66.6   Liters of O2: 2   SpO2: 99   HR: 56   BP: 110/56   CBG: NA  Exercise vitals:   Highest heartrate:  79   Lowest oxygen saturation: 95   Highest blood pressure: 130/80   Liters of 02: 4  Post-exercise vitals:   SpO2: 97   HR: 54   BP: 110/48   Liters of O2: 2   CBG: NA Dr. Brand Males, Medical Director Dr. Broadus John is immediately available during today's Pulmonary Rehab session for [name] on 11/22/2013 at 1330 class time.

## 2013-11-23 ENCOUNTER — Ambulatory Visit (INDEPENDENT_AMBULATORY_CARE_PROVIDER_SITE_OTHER): Payer: Medicare Other | Admitting: *Deleted

## 2013-11-23 DIAGNOSIS — I4891 Unspecified atrial fibrillation: Secondary | ICD-10-CM | POA: Diagnosis not present

## 2013-11-23 DIAGNOSIS — Z7901 Long term (current) use of anticoagulants: Secondary | ICD-10-CM | POA: Diagnosis not present

## 2013-11-23 LAB — POCT INR: INR: 3

## 2013-11-24 ENCOUNTER — Encounter (HOSPITAL_COMMUNITY)
Admission: RE | Admit: 2013-11-24 | Discharge: 2013-11-24 | Disposition: A | Discharge status: Home / Self Care | Payer: Medicare Other | Source: Ambulatory Visit | Attending: Internal Medicine | Admitting: Internal Medicine

## 2013-11-24 DIAGNOSIS — Z5189 Encounter for other specified aftercare: Secondary | ICD-10-CM | POA: Diagnosis not present

## 2013-11-24 NOTE — Progress Notes (Signed)
Today, Brian [name] exercised at [facility]. Service time was from 1330 to 1530.  The patient exercised for more than 31 minutes performing aerobic, strengthening, and stretching exercises. Oxygen saturation, heart rate, blood pressure, rate of perceived exertion, and shortness of breath were all monitored before, during, and after exercise. Brian [name] presented with no problems at today's exercise session. Brian [name] also attended an education session on home oxygen use and safety.  There was no workload change during today's exercise session.  Pre-exercise vitals:   Weight kg: 67.0   Liters of O2: 2L   SpO2: 100   HR: 60   BP: 104/62   CBG: na  Exercise vitals:   Highest heartrate:  70   Lowest oxygen saturation: 98   Highest blood pressure: 140/70   Liters of 02: 4L  Post-exercise vitals:   SpO2: 100   HR: 54   BP: 102/68   Liters of O2: 2L   CBG: na  Dr. Brand Males, Medical Director Dr. Wendee Beavers is immediately available during today's Pulmonary Rehab session for Brian [name] on 11/24/2013 at 1330 class time.

## 2013-11-29 ENCOUNTER — Encounter (HOSPITAL_COMMUNITY)
Admission: RE | Admit: 2013-11-29 | Discharge: 2013-11-29 | Disposition: A | Discharge status: Home / Self Care | Payer: Medicare Other | Source: Ambulatory Visit | Attending: Internal Medicine | Admitting: Internal Medicine

## 2013-11-29 DIAGNOSIS — Z5189 Encounter for other specified aftercare: Secondary | ICD-10-CM | POA: Diagnosis not present

## 2013-11-29 NOTE — Progress Notes (Signed)
Today, Brian [name] exercised at [facility]. Service time was from 1330 to 1500.  The patient exercised for more than 31 minutes performing aerobic, strengthening, and stretching exercises. Oxygen saturation, heart rate, blood pressure, rate of perceived exertion, and shortness of breath were all monitored before, during, and after exercise. Brian [name] presented with no problems at today's exercise session.   There was no workload change during today's exercise session.  Pre-exercise vitals:   Weight kg: 68.3   Liters of O2: 2L   SpO2: 92   HR: 53   BP: 102/58   CBG: na  Exercise vitals:   Highest heartrate:  79   Lowest oxygen saturation: 94   Highest blood pressure: 118/82   Liters of 02: 4L  Post-exercise vitals:   SpO2: 100   HR: 61   BP: 114/56   Liters of O2: 2L   CBG: na  Dr. Brand Males, Medical Director Dr. Wendee Beavers is immediately available during today's Pulmonary Rehab session for Brian [name] on 11/29/2013 at 1330 class time.

## 2013-12-01 ENCOUNTER — Encounter (HOSPITAL_COMMUNITY)
Admission: RE | Admit: 2013-12-01 | Discharge: 2013-12-01 | Disposition: A | Discharge status: Home / Self Care | Payer: Medicare Other | Source: Ambulatory Visit | Attending: Internal Medicine | Admitting: Internal Medicine

## 2013-12-01 DIAGNOSIS — Z5189 Encounter for other specified aftercare: Secondary | ICD-10-CM | POA: Diagnosis not present

## 2013-12-01 NOTE — Progress Notes (Signed)
Today, Nicholaos exercised at [facility]. Service time was from 1330 to 1530.  The patient exercised for more than 31 minutes performing aerobic, strengthening, and stretching exercises. Oxygen saturation, heart rate, blood pressure, rate of perceived exertion, and shortness of breath were all monitored before, during, and after exercise. Alfonza presented with no problems at today's exercise session. Squire also attended an education session on nutrition for the pulmonary patient.  There was no workload change during today's exercise session.  Pre-exercise vitals:   Weight kg: 68.0   Liters of O2: 2L   SpO2: 99   HR: 55   BP: 124/60   CBG: na  Exercise vitals:   Highest heartrate:  89   Lowest oxygen saturation: 90   Highest blood pressure: 120/74   Liters of 02: 4L  Post-exercise vitals:   SpO2: 100   HR: 54   BP: 96/54   Liters of O2: 2L   CBG: na  Dr. Brand Males, Medical Director Dr. Coralyn Pear is immediately available during today's Pulmonary Rehab session for [name] on 12/01/2013 at 1330 class time.

## 2013-12-06 ENCOUNTER — Encounter (HOSPITAL_COMMUNITY)
Admission: RE | Admit: 2013-12-06 | Discharge: 2013-12-06 | Disposition: A | Discharge status: Home / Self Care | Payer: Medicare Other | Source: Ambulatory Visit | Attending: Internal Medicine | Admitting: Internal Medicine

## 2013-12-06 DIAGNOSIS — Z5189 Encounter for other specified aftercare: Secondary | ICD-10-CM | POA: Diagnosis not present

## 2013-12-06 NOTE — Progress Notes (Signed)
Today, Cowen exercised at [facility]. Service time was from 1:30pm to 3:30pm.  The patient exercised for more than 31 minutes performing aerobic, strengthening, and stretching exercises. Oxygen saturation, heart rate, blood pressure, rate of perceived exertion, and shortness of breath were all monitored before, during, and after exercise. Jaquise presented with no problems at today's exercise session.   There was an increase in workload change during today's exercise session.  Pre-exercise vitals:   Weight kg: 68.3   Liters of O2: 2   SpO2: 100   HR: 54   BP: 120/70   CBG: na  Exercise vitals:   Highest heartrate:  82   Lowest oxygen saturation: 94   Highest blood pressure: 140/78   Liters of 02: 4  Post-exercise vitals:   SpO2: 100   HR: 67   BP: 100/56   Liters of O2: 2   CBG: na  Dr. Brand Males, Medical Director Dr. Coralyn Pear is immediately available during today's Pulmonary Rehab session for [name] on 12/06/13 at 1:30pm class time.

## 2013-12-07 DIAGNOSIS — J84112 Idiopathic pulmonary fibrosis: Secondary | ICD-10-CM | POA: Diagnosis not present

## 2013-12-08 ENCOUNTER — Encounter (HOSPITAL_COMMUNITY)
Admission: RE | Admit: 2013-12-08 | Discharge: 2013-12-08 | Disposition: A | Discharge status: Home / Self Care | Payer: Medicare Other | Source: Ambulatory Visit | Attending: Internal Medicine | Admitting: Internal Medicine

## 2013-12-08 DIAGNOSIS — Z5189 Encounter for other specified aftercare: Secondary | ICD-10-CM | POA: Diagnosis not present

## 2013-12-08 NOTE — Progress Notes (Signed)
Today, Brian [name] exercised at [facility]. Service time was from 1330 to 1530.  The patient exercised for more than 31 minutes performing aerobic, strengthening, and stretching exercises. Oxygen saturation, heart rate, blood pressure, rate of perceived exertion, and shortness of breath were all monitored before, during, and after exercise. Brian [name] presented with no problems at today's exercise session. Patient attended the exercise class for the pulmonary patient.   There was no workload change during today's exercise session.  Pre-exercise vitals:   Weight kg: 68.3   Liters of O2: 2   SpO2: 99   HR: 58   BP: 128/70   CBG: na  Exercise vitals:   Highest heartrate:  82   Lowest oxygen saturation: 93   Highest blood pressure: 128/60   Liters of 02: 4  Post-exercise vitals:   SpO2: 99   HR: 68   BP: 122/72   Liters of O2: 4   CBG: na Dr. Brand Males, Medical Director Dr. Wyline Copas is immediately available during today's Pulmonary Rehab session for Brian [name] on 12/08/2013 at 1330 class time.

## 2013-12-09 ENCOUNTER — Ambulatory Visit (INDEPENDENT_AMBULATORY_CARE_PROVIDER_SITE_OTHER): Payer: Medicare Other | Admitting: Pharmacist

## 2013-12-09 DIAGNOSIS — Z7901 Long term (current) use of anticoagulants: Secondary | ICD-10-CM | POA: Diagnosis not present

## 2013-12-09 DIAGNOSIS — I4891 Unspecified atrial fibrillation: Secondary | ICD-10-CM

## 2013-12-09 LAB — POCT INR: INR: 2.9

## 2013-12-13 ENCOUNTER — Encounter (HOSPITAL_COMMUNITY)
Admission: RE | Admit: 2013-12-13 | Discharge: 2013-12-13 | Disposition: A | Discharge status: Home / Self Care | Payer: Medicare Other | Source: Ambulatory Visit | Attending: Internal Medicine | Admitting: Internal Medicine

## 2013-12-13 DIAGNOSIS — Z5189 Encounter for other specified aftercare: Secondary | ICD-10-CM | POA: Diagnosis not present

## 2013-12-13 NOTE — Progress Notes (Signed)
Today, Brian [name] exercised at [facility]. Service time was from 1330 to 1500.  The patient exercised for more than 31 minutes performing aerobic, strengthening, and stretching exercises. Oxygen saturation, heart rate, blood pressure, rate of perceived exertion, and shortness of breath were all monitored before, during, and after exercise. Brian [name] presented with no problems at today's exercise session.   There was a workload change during today's exercise session.  Pre-exercise vitals:   Weight kg: 68.6   Liters of O2: 2L   SpO2: 100   HR: 54   BP: 100/58   CBG: na  Exercise vitals:   Highest heartrate:  84   Lowest oxygen saturation: 94   Highest blood pressure: 114/60   Liters of 02: 4L  Post-exercise vitals:   SpO2: 100   HR: 69   BP: 124/54   Liters of O2: 2L   CBG: na  Dr. Brand Males, Medical Director Dr. Wyline Copas is immediately available during today's Pulmonary Rehab session for Brian [name] on 12/13/2013 at 1330 class time.

## 2013-12-15 ENCOUNTER — Encounter (HOSPITAL_COMMUNITY)
Admission: RE | Admit: 2013-12-15 | Discharge: 2013-12-15 | Disposition: A | Discharge status: Home / Self Care | Payer: Medicare Other | Source: Ambulatory Visit | Attending: Internal Medicine | Admitting: Internal Medicine

## 2013-12-15 DIAGNOSIS — Z23 Encounter for immunization: Secondary | ICD-10-CM | POA: Diagnosis not present

## 2013-12-15 DIAGNOSIS — Z5189 Encounter for other specified aftercare: Secondary | ICD-10-CM | POA: Diagnosis not present

## 2013-12-15 NOTE — Progress Notes (Signed)
Today, Simmie exercised at [facility]. Service time was from 1330 to 1515.  The patient exercised for more than 31 minutes performing aerobic, strengthening, and stretching exercises. Oxygen saturation, heart rate, blood pressure, rate of perceived exertion, and shortness of breath were all monitored before, during, and after exercise. Lamonte presented with no problems at today's exercise session.  Patient attended the warning signs and symptoms class today.   There was a workload change during today's exercise session.  Pre-exercise vitals:   Weight kg: 68.5   Liters of O2: 2    SpO2: 100   HR: 62   BP: 148/88   CBG: NA  Exercise vitals:   Highest heartrate:  76   Lowest oxygen saturation: 97   Highest blood pressure: 142/62   Liters of 02: 2  Post-exercise vitals:   SpO2: 99   HR: 70   BP: 98/60   Liters of O2: 2   CBG: NA Dr. Brand Males, Medical Director Dr. Dyann Kief is immediately available during today's Pulmonary Rehab session for [name] on 12/15/2013 at 1330 class time.

## 2013-12-15 NOTE — Progress Notes (Signed)
I have reviewed a Home Exercise Prescription with [name] . Garrie is not currently exercising at home.  The patient was advised to walk 2-3 days a week for 30-35 minutes.  Hinton Dyer and I discussed how to progress their exercise prescription.  The patient stated that their goals were to live a long and healthy life and gain more energy.  The patient stated that they understand the exercise prescription.  We reviewed exercise guidelines, target heart rate during exercise, oxygen use, weather, home pulse oximeter, endpoints for exercise, and goals.  Patient is encouraged to come to me with any questions. I will continue to follow up with the patient to assist them with progression and safety.

## 2013-12-20 ENCOUNTER — Encounter (HOSPITAL_COMMUNITY)
Admission: RE | Admit: 2013-12-20 | Discharge: 2013-12-20 | Disposition: A | Discharge status: Home / Self Care | Payer: Medicare Other | Source: Ambulatory Visit | Attending: Internal Medicine | Admitting: Internal Medicine

## 2013-12-20 DIAGNOSIS — Z5189 Encounter for other specified aftercare: Secondary | ICD-10-CM | POA: Diagnosis not present

## 2013-12-20 NOTE — Progress Notes (Signed)
Today, Brian [name] exercised at [facility]. Service time was from 1330 to 1500.  The patient exercised for more than 31 minutes performing aerobic, strengthening, and stretching exercises. Oxygen saturation, heart rate, blood pressure, rate of perceived exertion, and shortness of breath were all monitored before, during, and after exercise. Brian [name] presented with no problems at today's exercise session.   There was no workload change during today's exercise session.  Pre-exercise vitals:   Weight kg: 69.1   Liters of O2: 2   SpO2: 100   HR: 75   BP: 124/60   CBG: na  Exercise vitals:   Highest heartrate:  90   Lowest oxygen saturation: 93   Highest blood pressure: 122/60   Liters of 02: 4  Post-exercise vitals:   SpO2: 100   HR: 67   BP: 120/60   Liters of O2: 2   CBG: NA Dr. Brand Males, Medical Director Dr. Dyann Kief is immediately available during today's Pulmonary Rehab session for Brian [name] on 12/20/2013 at 1330 class time.

## 2013-12-22 ENCOUNTER — Encounter (HOSPITAL_COMMUNITY)
Admission: RE | Admit: 2013-12-22 | Discharge: 2013-12-22 | Disposition: A | Discharge status: Home / Self Care | Payer: Medicare Other | Source: Ambulatory Visit | Attending: Internal Medicine | Admitting: Internal Medicine

## 2013-12-22 DIAGNOSIS — J449 Chronic obstructive pulmonary disease, unspecified: Secondary | ICD-10-CM | POA: Diagnosis not present

## 2013-12-22 DIAGNOSIS — Z23 Encounter for immunization: Secondary | ICD-10-CM | POA: Diagnosis not present

## 2013-12-22 NOTE — Progress Notes (Signed)
Today, Brian [name] exercised at [employer]. Service time was from 1230 to 1500.  The patient exercised for more than 31 minutes performing aerobic, strengthening, and stretching exercises. Oxygen saturation, heart rate, blood pressure, rate of perceived exertion, and shortness of breath were all monitored before, during, and after exercise. Brian [name] presented with no problems at today's exercise session. Brian [name] also attended a q and a session with the pulmonary md.  There was no workload change during today's exercise session.  Pre-exercise vitals:   Weight kg: 69.1   Liters of O2: 2L   SpO2: 98   HR: 63   BP: 120/60   CBG: na  Exercise vitals:   Highest heartrate:  86   Lowest oxygen saturation: 91   Highest blood pressure: 122/68   Liters of 02: 4L  Post-exercise vitals:   SpO2: 99   HR: 69   BP: 98/62   Liters of O2: 2L   CBG: na  Dr. Brand Males, Medical Director Dr. Aileen Fass is immediately available during today's Pulmonary Rehab session for Brian [name] on 12/22/2013 at 1230 class time.

## 2013-12-22 NOTE — Progress Notes (Deleted)
Today, Brian [name] exercised at [employer]. Service time was from 1230 to 1500.  The patient exercised for more than 31 minutes performing aerobic, strengthening, and stretching exercises. Oxygen saturation, heart rate, blood pressure, rate of perceived exertion, and shortness of breath were all monitored before, during, and after exercise. Brian [name] presented with no problems at today's exercise session. Brian [name] also attended a question and answer session with the pulmonary MD.  There was a workload change during today's exercise session.  Pre-exercise vitals:   Weight kg: 51.6   Liters of O2: ra   SpO2: 95   HR: 97   BP: 98/50   CBG: na  Exercise vitals:   Highest heartrate:  98   Lowest oxygen saturation: 97   Highest blood pressure: 108/60   Liters of 02: ra  Post-exercise vitals:   SpO2: 95   HR: 95   BP: 108/62   Liters of O2: ra   CBG: na  Dr. Brand Males, Medical Director Dr. Aileen Fass is immediately available during today's Pulmonary Rehab session for [name] on 12/22/2013 at 1230 class time.

## 2013-12-27 ENCOUNTER — Encounter (HOSPITAL_COMMUNITY)
Admission: RE | Admit: 2013-12-27 | Discharge: 2013-12-27 | Disposition: A | Discharge status: Home / Self Care | Payer: Medicare Other | Source: Ambulatory Visit | Attending: Internal Medicine | Admitting: Internal Medicine

## 2013-12-27 DIAGNOSIS — J449 Chronic obstructive pulmonary disease, unspecified: Secondary | ICD-10-CM | POA: Diagnosis not present

## 2013-12-27 NOTE — Progress Notes (Signed)
Today, Brian [name] exercised at [employer]. Service time was from 1330 to 1500.  The patient exercised for more than 31 minutes performing aerobic, strengthening, and stretching exercises. Oxygen saturation, heart rate, blood pressure, rate of perceived exertion, and shortness of breath were all monitored before, during, and after exercise. Brian [name] presented with no problems at today's exercise session.   There was no workload change during today's exercise session.  Pre-exercise vitals:   Weight kg: 68.0   Liters of O2: 2L   SpO2: 97   HR: 69   BP: 122/60   CBG: na  Exercise vitals:   Highest heartrate:  102   Lowest oxygen saturation: 92   Highest blood pressure: 120/64   Liters of 02: 4L  Post-exercise vitals:   SpO2: 99   HR: 77   BP: 102/60   Liters of O2: 2L   CBG: na  Dr. Brand Males, Medical Director Dr. Aileen Fass is immediately available during today's Pulmonary Rehab session for Brian [name] on 12/27/2013 at 1330 class time.

## 2013-12-29 ENCOUNTER — Encounter (HOSPITAL_COMMUNITY)
Admission: RE | Admit: 2013-12-29 | Discharge: 2013-12-29 | Disposition: A | Discharge status: Home / Self Care | Payer: Medicare Other | Source: Ambulatory Visit | Attending: Internal Medicine | Admitting: Internal Medicine

## 2013-12-29 DIAGNOSIS — J449 Chronic obstructive pulmonary disease, unspecified: Secondary | ICD-10-CM | POA: Diagnosis not present

## 2013-12-29 NOTE — Progress Notes (Signed)
Today, Ballard exercised at [employer]. Service time was from 1330 to 1515.  The patient exercised for more than 31 minutes performing aerobic, strengthening, and stretching exercises. Oxygen saturation, heart rate, blood pressure, rate of perceived exertion, and shortness of breath were all monitored before, during, and after exercise. Brian [name] presented with no problems at today's exercise session. Brian [name] also attended an education session on pulmonary medication.  There was no workload change during today's exercise session.  Pre-exercise vitals:   Weight kg: 67.5   Liters of O2: 2L   SpO2: 97   HR: 57   BP: 114/60   CBG: na  Exercise vitals:   Highest heartrate:  86   Lowest oxygen saturation: 91   Highest blood pressure: 130/64   Liters of 02: 4L  Post-exercise vitals:   SpO2: 100   HR: 62   BP: 108/60   Liters of O2: 2L   CBG: na  Dr. Brand Males, Medical Director Dr. Dyann Kief is immediately available during today's Pulmonary Rehab session for [name] on 12/29/2013 at 1330 class time.

## 2013-12-29 NOTE — Progress Notes (Signed)
Nutrition Note Spoke with pt's wife re: pt's recent wt loss. Pt wt is 67.5 kg, which is down 1 lb over the past 2 days. Pt is trying to consume 2600 kcal/day. Pt has had a decrease in appetite since starting Esbriet. Pt found out today that he was taking his Esbriet too close together, which may be causing some side effects. Pt to see his MD at [facility] next week. Continue to encourage high calorie, high protein diet and monitor weight trends. Continue client-centered nutrition education by RD as part of interdisciplinary care.  Monitor and evaluate progress toward nutrition goal with team.  Brian [name], M.Ed, RD, LDN, CDE 12/29/2013 3:07 PM

## 2013-12-31 DIAGNOSIS — J84112 Idiopathic pulmonary fibrosis: Secondary | ICD-10-CM | POA: Diagnosis not present

## 2014-01-03 ENCOUNTER — Encounter (HOSPITAL_COMMUNITY): Admission: RE | Admit: 2014-01-03 | Payer: Medicare Other | Source: Ambulatory Visit

## 2014-01-03 DIAGNOSIS — E559 Vitamin D deficiency, unspecified: Secondary | ICD-10-CM | POA: Diagnosis not present

## 2014-01-03 DIAGNOSIS — J439 Emphysema, unspecified: Secondary | ICD-10-CM | POA: Diagnosis not present

## 2014-01-03 DIAGNOSIS — J849 Interstitial pulmonary disease, unspecified: Secondary | ICD-10-CM | POA: Diagnosis not present

## 2014-01-03 DIAGNOSIS — J84112 Idiopathic pulmonary fibrosis: Secondary | ICD-10-CM | POA: Diagnosis not present

## 2014-01-05 ENCOUNTER — Encounter (HOSPITAL_COMMUNITY)
Admission: RE | Admit: 2014-01-05 | Discharge: 2014-01-05 | Disposition: A | Discharge status: Home / Self Care | Payer: Medicare Other | Source: Ambulatory Visit | Attending: Internal Medicine | Admitting: Internal Medicine

## 2014-01-05 DIAGNOSIS — J449 Chronic obstructive pulmonary disease, unspecified: Secondary | ICD-10-CM | POA: Diagnosis not present

## 2014-01-05 NOTE — Progress Notes (Signed)
Today, [name] exercised at [employer]. Service time was from 1330 to 1515.  The patient exercised for more than 31 minutes performing aerobic, strengthening, and stretching exercises. Oxygen saturation, heart rate, blood pressure, rate of perceived exertion, and shortness of breath were all monitored before, during, and after exercise. Brian [name] presented with no problems at today's exercise session. Patient attended the oxygen safety class today.   There was no workload change during today's exercise session.  Pre-exercise vitals:   Weight kg: 67.5   Liters of O2: 2   SpO2: 98   HR: 60   BP: 110/60   CBG: NA  Exercise vitals:   Highest heartrate:  91   Lowest oxygen saturation: 90   Highest blood pressure: 118/74   Liters of 02: 4  Post-exercise vitals:   SpO2: 100   HR: 62   BP: 100/60   Liters of O2: 2   CBG: NA Dr. Brand Males, Medical Director Dr. Aileen Fass is immediately available during today's Pulmonary Rehab session for [name] on 01/05/2014 at 1330 class time.

## 2014-01-06 ENCOUNTER — Ambulatory Visit (INDEPENDENT_AMBULATORY_CARE_PROVIDER_SITE_OTHER): Payer: Medicare Other

## 2014-01-06 DIAGNOSIS — I4891 Unspecified atrial fibrillation: Secondary | ICD-10-CM

## 2014-01-06 DIAGNOSIS — Z7901 Long term (current) use of anticoagulants: Secondary | ICD-10-CM

## 2014-01-06 LAB — POCT INR: INR: 1.5

## 2014-01-10 ENCOUNTER — Encounter (HOSPITAL_COMMUNITY)
Admission: RE | Admit: 2014-01-10 | Discharge: 2014-01-10 | Disposition: A | Discharge status: Home / Self Care | Payer: Medicare Other | Source: Ambulatory Visit | Attending: Internal Medicine | Admitting: Internal Medicine

## 2014-01-10 DIAGNOSIS — J449 Chronic obstructive pulmonary disease, unspecified: Secondary | ICD-10-CM | POA: Diagnosis not present

## 2014-01-10 NOTE — Progress Notes (Signed)
Today, [name] exercised at [employer]. Service time was from 1330 to 1500.  The patient exercised for more than 31 minutes performing aerobic, strengthening, and stretching exercises. Oxygen saturation, heart rate, blood pressure, rate of perceived exertion, and shortness of breath were all monitored before, during, and after exercise. Brian [name] presented with no problems at today's exercise session.   There was one workload change during today's exercise session.  Pre-exercise vitals:   Weight kg: 67.6   Liters of O2: 2   SpO2: 94   HR: 58   BP: 108/62   CBG: na  Exercise vitals:   Highest heartrate:  92   Lowest oxygen saturation: 92   Highest blood pressure: 140/62   Liters of 02: 4  Post-exercise vitals:   SpO2: 99   HR: 62   BP: 108/60   Liters of O2: 2   CBG: NA Dr. Brand Males, Medical Director Dr. Aileen Fass is immediately available during today's Pulmonary Rehab session for [name] on 01/10/2014 at 1330 class time.

## 2014-01-12 ENCOUNTER — Encounter (HOSPITAL_COMMUNITY)
Admission: RE | Admit: 2014-01-12 | Discharge: 2014-01-12 | Disposition: A | Discharge status: Home / Self Care | Payer: Medicare Other | Source: Ambulatory Visit | Attending: Internal Medicine | Admitting: Internal Medicine

## 2014-01-12 DIAGNOSIS — J449 Chronic obstructive pulmonary disease, unspecified: Secondary | ICD-10-CM | POA: Diagnosis not present

## 2014-01-12 NOTE — Progress Notes (Signed)
Today, Brian [name] exercised at [employer]. Service time was from 1330 to 1515.  The patient exercised for more than 31 minutes performing aerobic, strengthening, and stretching exercises. Oxygen saturation, heart rate, blood pressure, rate of perceived exertion, and shortness of breath were all monitored before, during, and after exercise. Brian [name] presented with no problems at today's exercise session. Patient attended the [place] services class today.   There was no workload change during today's exercise session.  Pre-exercise vitals:   Weight kg: 67.6   Liters of O2: 2   SpO2: 98   HR: 51   BP: 118/60   CBG: no  Exercise vitals:   Highest heartrate:  84   Lowest oxygen saturation: 93   Highest blood pressure: 122/60   Liters of 02: 4  Post-exercise vitals:   SpO2: 99   HR: 62   BP: 110/74   Liters of O2: 2   CBG: NA Dr. Brand Males, Medical Director Dr. Wynelle Cleveland is immediately available during today's Pulmonary Rehab session for Brian [name] on 01/12/2014 at 1330 class time.

## 2014-01-16 ENCOUNTER — Ambulatory Visit (INDEPENDENT_AMBULATORY_CARE_PROVIDER_SITE_OTHER): Payer: Medicare Other

## 2014-01-16 DIAGNOSIS — Z7901 Long term (current) use of anticoagulants: Secondary | ICD-10-CM

## 2014-01-16 DIAGNOSIS — I4891 Unspecified atrial fibrillation: Secondary | ICD-10-CM | POA: Diagnosis not present

## 2014-01-16 LAB — POCT INR: INR: 1.7

## 2014-01-17 ENCOUNTER — Encounter (HOSPITAL_COMMUNITY)
Admission: RE | Admit: 2014-01-17 | Discharge: 2014-01-17 | Disposition: A | Discharge status: Home / Self Care | Payer: Medicare Other | Source: Ambulatory Visit | Attending: Internal Medicine | Admitting: Internal Medicine

## 2014-01-17 DIAGNOSIS — J449 Chronic obstructive pulmonary disease, unspecified: Secondary | ICD-10-CM | POA: Diagnosis not present

## 2014-01-17 NOTE — Progress Notes (Signed)
Brian [name] completed a Six-Minute Walk Test on 01/17/14 . Brian [name] walked 1,064 feet with 0 breaks.  The patient's lowest oxygen saturation was 95% , highest heart rate was 120bpm , and highest blood pressure was 124/50. The patient was on 4 liters of oxygen with a nasal cannula. Brian [name] stated that nothing hindered his walk test.

## 2014-01-19 ENCOUNTER — Encounter (HOSPITAL_COMMUNITY): Payer: Medicare Other

## 2014-01-21 ENCOUNTER — Other Ambulatory Visit: Payer: Self-pay | Admitting: Internal Medicine

## 2014-01-24 ENCOUNTER — Other Ambulatory Visit: Payer: Self-pay | Admitting: *Deleted

## 2014-01-24 ENCOUNTER — Encounter (HOSPITAL_COMMUNITY): Payer: Medicare Other

## 2014-01-24 ENCOUNTER — Encounter: Payer: Self-pay | Admitting: *Deleted

## 2014-01-25 ENCOUNTER — Encounter: Payer: Self-pay | Admitting: Cardiology

## 2014-01-25 ENCOUNTER — Encounter: Payer: Self-pay | Admitting: *Deleted

## 2014-01-25 ENCOUNTER — Ambulatory Visit (INDEPENDENT_AMBULATORY_CARE_PROVIDER_SITE_OTHER): Payer: Medicare Other | Admitting: Cardiology

## 2014-01-25 VITALS — BP 139/78 | HR 46 | Ht 71.0 in | Wt 153.0 lb

## 2014-01-25 DIAGNOSIS — R42 Dizziness and giddiness: Secondary | ICD-10-CM

## 2014-01-25 DIAGNOSIS — I4891 Unspecified atrial fibrillation: Secondary | ICD-10-CM | POA: Diagnosis not present

## 2014-01-25 DIAGNOSIS — E785 Hyperlipidemia, unspecified: Secondary | ICD-10-CM

## 2014-01-25 DIAGNOSIS — I10 Essential (primary) hypertension: Secondary | ICD-10-CM | POA: Diagnosis not present

## 2014-01-25 MED ORDER — NEBIVOLOL HCL 5 MG PO TABS: 5.0000 mg | ORAL_TABLET | Freq: Every day | ORAL | Status: DC

## 2014-01-25 NOTE — Patient Instructions (Addendum)
Decrease bystolic (nebivoll) to 5mg  daily. You can take 1/2 of your 10mg  tablet and use your current supply.   Your physician recommends that you have  lab work today--CBCd/Lipid profile.  Your physician has requested that you have a carotid duplex. This test is an ultrasound of the carotid arteries in your neck. It looks at blood flow through these arteries that supply the brain with blood. Allow one hour for this exam. There are no restrictions or special instructions. In the next week or so.  Your physician wants you to follow-up in: 1 year with Dr Aundra Dubin.(November 2016).  You will receive a reminder letter in the mail two months in advance. If you don't receive a letter, please call our office to schedule the follow-up appointment.

## 2014-01-26 ENCOUNTER — Encounter (HOSPITAL_COMMUNITY): Payer: Medicare Other

## 2014-01-26 LAB — LIPID PANEL
CHOLESTEROL: 127 mg/dL (ref 0–200)
HDL: 40.1 mg/dL (ref >39.00)
LDL Cholesterol: 71 mg/dL (ref 0–99)
NONHDL: 86.9
Total CHOL/HDL Ratio: 3
Triglycerides: 79 mg/dL (ref 0.0–149.0)
VLDL: 15.8 mg/dL (ref 0.0–40.0)

## 2014-01-26 LAB — CBC WITH DIFFERENTIAL/PLATELET
BASOS ABS: 0 10*3/uL (ref 0.0–0.1)
Basophils Relative: 0.2 % (ref 0.0–3.0)
Eosinophils Absolute: 0.1 10*3/uL (ref 0.0–0.7)
Eosinophils Relative: 1.6 % (ref 0.0–5.0)
HEMATOCRIT: 40.9 % (ref 39.0–52.0)
HEMOGLOBIN: 13.5 g/dL (ref 13.0–17.0)
LYMPHS ABS: 1.2 10*3/uL (ref 0.7–4.0)
Lymphocytes Relative: 18.4 % (ref 12.0–46.0)
MCHC: 33 g/dL (ref 30.0–36.0)
MCV: 95.6 fl (ref 78.0–100.0)
MONO ABS: 0.5 10*3/uL (ref 0.1–1.0)
MONOS PCT: 7.3 % (ref 3.0–12.0)
NEUTROS ABS: 4.7 10*3/uL (ref 1.4–7.7)
Neutrophils Relative %: 72.5 % (ref 43.0–77.0)
Platelets: 153 10*3/uL (ref 150.0–400.0)
RBC: 4.28 Mil/uL (ref 4.22–5.81)
RDW: 14.3 % (ref 11.5–15.5)
WBC: 6.5 10*3/uL (ref 4.0–10.5)

## 2014-01-26 NOTE — Progress Notes (Signed)
Patient ID: Brian [name], male   DOB: [birth year], 80 y.o.   MRN: [id number] PCP: Dr. Larose Kells  80 yo with history of chronic atrial fibrillation and HTN presents for cardiology followup.  Patient has had atrial fibrillation for a number of years since a gallstone pancreatitis episode. He is on warfarin and has had no problems with it.  Since last appointment, he got PNA in 2/15 and developed post-inflammatory pulmonary fibrosis.  He now uses oxygen with exertion and had a recent overnight oximetry. He tries to stay active and denies dyspnea when he walks with his oxygen.  No chest pain.  He does report lightheadedness when he turns his head to the side.  He did pulmonary rehab and is now going to the [facility]    ECG: atrial fibrillation at 48 with poor RWP.   Labs (10/14): K 3.8, creatinine 1.0, LDL 82, HDL 32, TGs 114  PMH: 1. Chronic atrial fibrillation: Began with gallstone pancreatitis episode. Echo (11/12) with EF 55-60%, mild MR, moderate biatrial enlargement.  2. HTN 3. Cholecystectomy after gallstone pancreatitis 4. Crohns Disease: Relatively quiescent. 5. Tremor: on propranolol since 1969.  6. BPH 7. OA 8. COPD 9. Skin cancer 10. Hyperlipidemia 11. Pneumonia in 2/15 with post-inflammatory pulmonary fibrosis.  He is now on oxygen with exertion.   SH: Married, 4 kids, retired, prior smoker but quit 1992.   FH: Father with MI at 50  ROS: All systems reviewed and negative except as per HPI.   Current Outpatient Prescriptions  Medication Sig Dispense Refill  . acetaminophen (TYLENOL) 325 MG tablet Take 650 mg by mouth as needed for pain.    Marland Kitchen amLODipine (NORVASC) 5 MG tablet TAKE 1 TABLET DAILY 90 tablet 3  . azelastine (ASTELIN) 0.1 % nasal spray Place 2 sprays into both nostrils at bedtime. 30 mL 6  . busPIRone (BUSPAR) 15 MG tablet TAKE 1 TABLET TWICE A DAY 180 tablet 0  . clonazePAM (KLONOPIN) 0.5 MG tablet Take 1 tablet (0.5 mg total) by mouth 3 (three) times daily as needed for  anxiety. 90 tablet 2  . fexofenadine (ALLEGRA) 180 MG tablet Take 180 mg by mouth daily.    Marland Kitchen gemfibrozil (LOPID) 600 MG tablet TAKE 1 TABLET TWICE A DAY 180 tablet 1  . mesalamine (PENTASA) 250 MG CR capsule Take 2 capsules (500 mg total) by mouth 2 (two) times daily. 360 capsule 3  . NASCOBAL 500 MCG/0.1ML SOLN USE ONE SPRAY NASALLY PER WEEK AS DIRECTED 1 Bottle 0  . OXYGEN Inhale into the lungs. 2 liters    . Pirfenidone (ESBRIET) 267 MG CAPS Take 2 capsules by mouth 3 (three) times daily.    . Vitamin D, Ergocalciferol, (DRISDOL) 50000 UNITS CAPS capsule Take 50,000 Units by mouth every 7 (seven) days.    Marland Kitchen warfarin (COUMADIN) 2 MG tablet TAKE 1 TABLET AS DIRECTED 90 tablet 0  . nebivolol (BYSTOLIC) 5 MG tablet Take 1 tablet (5 mg total) by mouth daily. 90 tablet 3   No current facility-administered medications for this visit.    BP 139/78 mmHg  Pulse 46  Ht 5\' 11"  (1.803 m)  Wt 153 lb (69.4 kg)  BMI 21.35 kg/m2 General: NAD Neck: No JVD, no thyromegaly or thyroid nodule.  Lungs: Dry crackles at bases bilaterally. CV: Nondisplaced PMI.  Heart mildly brady, irregular S1/S2, no S3/S4, no murmur.  Trace ankle edema.  No carotid bruit.  Normal pedal pulses.  Abdomen: Soft, nontender, no hepatosplenomegaly, no distention.  Skin:  Intact without lesions or rashes.  Neurologic: Alert and oriented x 3.  Psych: Normal affect. Extremities: No clubbing or cyanosis.   Assessment/Plan: 1. Atrial fibrillation: Chronic.  He really does not seem to have symptoms from his atrial fibrillation.   - HR is slow, will decrease nebivolol to 5 mg daily.  - Continue warfarin.  2. HTN: Reasonable control on amlodipine. If BP rises on lower nebivolol, can increase amlodipine to 10 mg daily.  3. Hyperlipidemia: Check lipids today.  4. Lightheadedness with turning his head: Will get carotid dopplers.   Loralie Champagne 01/26/2014

## 2014-01-26 NOTE — Progress Notes (Signed)
Discharge Note from Pulmonary Rehab  Patient has graduated from the undergraduate pulmonary rehab program.  He was a pleasure to have in the program, his attendance was very regular, and he worked very hard at exercising as well.  His goals were met while in the program which were to learn more about pulmonary fibrosis and to be able to join a gym with his wife after graduating.  He also learned how to manage his shortness of breath with purse lip breathing.  He has joined a local pulmonary fibrosis support group and seems to have a better understanding of his lung disease.  He is planning to continue exercising at a gym with his wife.

## 2014-01-30 ENCOUNTER — Ambulatory Visit (INDEPENDENT_AMBULATORY_CARE_PROVIDER_SITE_OTHER): Payer: Medicare Other

## 2014-01-30 ENCOUNTER — Other Ambulatory Visit: Payer: Self-pay

## 2014-01-30 ENCOUNTER — Ambulatory Visit (HOSPITAL_COMMUNITY): Payer: Medicare Other | Attending: Cardiology | Admitting: Cardiology

## 2014-01-30 DIAGNOSIS — I4891 Unspecified atrial fibrillation: Secondary | ICD-10-CM

## 2014-01-30 DIAGNOSIS — R42 Dizziness and giddiness: Secondary | ICD-10-CM | POA: Diagnosis not present

## 2014-01-30 DIAGNOSIS — Z7901 Long term (current) use of anticoagulants: Secondary | ICD-10-CM | POA: Diagnosis not present

## 2014-01-30 DIAGNOSIS — J449 Chronic obstructive pulmonary disease, unspecified: Secondary | ICD-10-CM | POA: Diagnosis not present

## 2014-01-30 DIAGNOSIS — E785 Hyperlipidemia, unspecified: Secondary | ICD-10-CM

## 2014-01-30 DIAGNOSIS — I10 Essential (primary) hypertension: Secondary | ICD-10-CM | POA: Diagnosis not present

## 2014-01-30 LAB — POCT INR: INR: 1.4

## 2014-01-30 MED ORDER — CYANOCOBALAMIN 500 MCG/0.1ML NA SOLN: NASAL | Status: DC

## 2014-01-30 NOTE — Progress Notes (Signed)
Carotid duplex performed 

## 2014-01-31 ENCOUNTER — Other Ambulatory Visit: Payer: Self-pay

## 2014-01-31 ENCOUNTER — Encounter (HOSPITAL_COMMUNITY): Payer: Medicare Other

## 2014-01-31 MED ORDER — CYANOCOBALAMIN 500 MCG/0.1ML NA SOLN: NASAL | Status: DC

## 2014-02-02 ENCOUNTER — Encounter (HOSPITAL_COMMUNITY): Payer: Medicare Other

## 2014-02-03 DIAGNOSIS — J84112 Idiopathic pulmonary fibrosis: Secondary | ICD-10-CM | POA: Diagnosis not present

## 2014-02-07 ENCOUNTER — Encounter (HOSPITAL_COMMUNITY): Payer: Medicare Other

## 2014-02-07 ENCOUNTER — Other Ambulatory Visit: Payer: Self-pay | Admitting: Internal Medicine

## 2014-02-09 ENCOUNTER — Encounter (HOSPITAL_COMMUNITY): Payer: Medicare Other

## 2014-02-09 ENCOUNTER — Ambulatory Visit (INDEPENDENT_AMBULATORY_CARE_PROVIDER_SITE_OTHER): Payer: Medicare Other | Admitting: Pharmacist

## 2014-02-09 DIAGNOSIS — Z7901 Long term (current) use of anticoagulants: Secondary | ICD-10-CM | POA: Diagnosis not present

## 2014-02-09 DIAGNOSIS — I4891 Unspecified atrial fibrillation: Secondary | ICD-10-CM

## 2014-02-09 LAB — POCT INR: INR: 1.6

## 2014-02-19 ENCOUNTER — Other Ambulatory Visit: Payer: Self-pay | Admitting: Internal Medicine

## 2014-02-20 ENCOUNTER — Other Ambulatory Visit: Payer: Self-pay

## 2014-02-23 ENCOUNTER — Ambulatory Visit (INDEPENDENT_AMBULATORY_CARE_PROVIDER_SITE_OTHER): Payer: Medicare Other | Admitting: *Deleted

## 2014-02-23 DIAGNOSIS — I4891 Unspecified atrial fibrillation: Secondary | ICD-10-CM

## 2014-02-23 DIAGNOSIS — Z7901 Long term (current) use of anticoagulants: Secondary | ICD-10-CM | POA: Diagnosis not present

## 2014-02-23 LAB — POCT INR: INR: 1.3

## 2014-03-06 ENCOUNTER — Ambulatory Visit (INDEPENDENT_AMBULATORY_CARE_PROVIDER_SITE_OTHER): Payer: Medicare Other

## 2014-03-06 DIAGNOSIS — I4891 Unspecified atrial fibrillation: Secondary | ICD-10-CM | POA: Diagnosis not present

## 2014-03-06 DIAGNOSIS — J84112 Idiopathic pulmonary fibrosis: Secondary | ICD-10-CM | POA: Diagnosis not present

## 2014-03-06 DIAGNOSIS — Z7901 Long term (current) use of anticoagulants: Secondary | ICD-10-CM | POA: Diagnosis not present

## 2014-03-06 LAB — POCT INR: INR: 1.6

## 2014-03-20 ENCOUNTER — Ambulatory Visit (INDEPENDENT_AMBULATORY_CARE_PROVIDER_SITE_OTHER): Payer: Medicare Other | Admitting: Pharmacist

## 2014-03-20 DIAGNOSIS — Z7901 Long term (current) use of anticoagulants: Secondary | ICD-10-CM | POA: Diagnosis not present

## 2014-03-20 DIAGNOSIS — I4891 Unspecified atrial fibrillation: Secondary | ICD-10-CM

## 2014-03-20 LAB — POCT INR: INR: 2

## 2014-03-22 ENCOUNTER — Ambulatory Visit (INDEPENDENT_AMBULATORY_CARE_PROVIDER_SITE_OTHER): Payer: Medicare Other | Admitting: Internal Medicine

## 2014-03-22 ENCOUNTER — Encounter: Payer: Self-pay | Admitting: Internal Medicine

## 2014-03-22 VITALS — BP 127/70 | HR 74 | Temp 97.6°F | Ht 71.0 in | Wt 149.4 lb

## 2014-03-22 DIAGNOSIS — J8410 Pulmonary fibrosis, unspecified: Secondary | ICD-10-CM

## 2014-03-22 DIAGNOSIS — E559 Vitamin D deficiency, unspecified: Secondary | ICD-10-CM

## 2014-03-22 DIAGNOSIS — I10 Essential (primary) hypertension: Secondary | ICD-10-CM | POA: Diagnosis not present

## 2014-03-22 DIAGNOSIS — F411 Generalized anxiety disorder: Secondary | ICD-10-CM

## 2014-03-22 DIAGNOSIS — E781 Pure hyperglyceridemia: Secondary | ICD-10-CM

## 2014-03-22 DIAGNOSIS — Z0000 Encounter for general adult medical examination without abnormal findings: Secondary | ICD-10-CM

## 2014-03-22 DIAGNOSIS — M8580 Other specified disorders of bone density and structure, unspecified site: Secondary | ICD-10-CM | POA: Diagnosis not present

## 2014-03-22 LAB — BASIC METABOLIC PANEL
BUN: 18 mg/dL (ref 6–23)
CALCIUM: 9 mg/dL (ref 8.4–10.5)
CO2: 24 mEq/L (ref 19–32)
Chloride: 107 mEq/L (ref 96–112)
Creatinine, Ser: 1 mg/dL (ref 0.4–1.5)
GFR: 78.42 mL/min (ref >60.00)
GLUCOSE: 89 mg/dL (ref 70–99)
POTASSIUM: 3.7 meq/L (ref 3.5–5.1)
Sodium: 140 mEq/L (ref 135–145)

## 2014-03-22 LAB — TSH: TSH: 1.01 u[IU]/mL (ref 0.35–4.50)

## 2014-03-22 NOTE — Progress Notes (Signed)
Pre visit review using our clinic review tool, if applicable. No additional management support is needed unless otherwise documented below in the visit note. 

## 2014-03-22 NOTE — Assessment & Plan Note (Signed)
No recent bone density test, will order one

## 2014-03-22 NOTE — Assessment & Plan Note (Signed)
Last vitamin D was check at [facility] October 2015 and was 16 (low) on oral supplements

## 2014-03-22 NOTE — Assessment & Plan Note (Signed)
Currently well controlled on Lopid.

## 2014-03-22 NOTE — Assessment & Plan Note (Addendum)
Td 2012,  Pneumonia shot 2009 prevnar-- got at the drugstore Shingles immunization --- 2013  colonoscopy in 2007, 04/2008, 03-2010 (next 5 years)   prostate cancer screening -- DRE PSA wnl 2014 Diet and exercise discussed  Palpable abdominal aorta on exam today, CT of the abdomen 06/2013 no AAA

## 2014-03-22 NOTE — Assessment & Plan Note (Signed)
Symptoms currently well controlled on BuSpar twice a day and clonazepam on average one daily UDS low risk 05-2013

## 2014-03-22 NOTE — Progress Notes (Signed)
Subjective:    Patient ID: Brian [name], male    DOB: [birth year], 80 y.o.   MRN: [id number]  DOS:  03/22/2014 Type of visit - description :   Here for Medicare AWV: 1.Risk factors based on Past M, S, F history: yes   2. Physical Activities:walks 1.5 miles a day for pulm rehab, still does ADL 3. Depression/mood: mild depression-anxiety, on buspar - clonazepam, sx ok   4. Hearing: decreased a little, at baseline not affecting ADL   5.  ADL's: totally independent , still drives   6.  Fall Risk: prevention discussed 7. Home Safety: does feel safe at home   8.  Height, weight, &visual acuity: see VS, vision good w/ correction, saw eye doctor ~ 6 months ago  9.  Counseling: yes , see below   10. Labs ordered based on risk factors:  yes   11. Referral Coordination: if needed   12.  Care Plan: see a/p   13. Cognitive Assessment --motor skills stable, has tremor; memory and cognition seems appropriate for age l 23. Care team updated 15. Written plan of care provided    In addition, we discussed the following issues Pulmonary fibrosis, follow-up at [facility], see assessment and plan Cardiovascular, saw  cardiology last month, beta blockers dose decreased. He was felt to be stable Hyperlipidemia, last FLP last month, very good results B12 and vitamin D deficiencies, good compliance with supplements. GI--  Had problems with diarrhea, symptoms resolved. Also problems with weight loss, weight has stabilized   ROS  denies chest pain, shortness of breath at baseline No nausea, vomiting, diarrhea blood in the stools or abdominal pain No sputum production or hemoptysis   Past Medical History  Diagnosis Date  . Muscle tremor     saw neurology remotely elsewhere, was Rx inderal  . Crohn's ileocolitis   . Osteopenia     per DEXA 11/2008  . Hypertension 11/11    mild  . Hemorrhoids   . Hypertrophy of prostate     w/o UR obst & oth luts  . Peripheral neuropathy     h/o neg w/u  .  Osteoarthritis     ankle,right  . ED (erectile dysfunction)   . Hypertriglyceridemia   . B12 deficiency   . COPD (chronic obstructive pulmonary disease)     recent dx--no acute problems  . Skin cancer     squamous cell skin cancers-tx'd q29yrs  . Hiatal hernia 1956    found while in service  . Anxiety   . Atrial fibrillation with RVR   . Long term (current) use of anticoagulants 02/21/2011  . Serrated adenoma of colon 03/2010  . Renal cyst     bilateral  . Pulmonary fibrosis 2015    Rx Esbriet ~ 10-2013 ([name])  . On home oxygen therapy     Past Surgical History  Procedure Laterality Date  . Resection of distal ileum and cecum w/ appendectomy      18-inch small intestine, 1984 for Crohn's disease  . Colon surgery  1983    has chrohn's  . Appendectomy  1983  . Cholecystectomy  02/17/2011    Procedure: LAPAROSCOPIC CHOLECYSTECTOMY WITH INTRAOPERATIVE CHOLANGIOGRAM;  Surgeon: Edward Jolly, MD;  Location: [facility];  Service: General;  Laterality: N/A;  . Cataract extraction      bilateral ~ 2007    History   Social History  . Marital Status: Married    Spouse Name: Dalene Seltzer    Number of  Children: 4  . Years of Education: N/A   Occupational History  . Retired   . RETIRED    Social History Main Topics  . Smoking status: Former Smoker -- 1.00 packs/day for 40 years    Types: Cigarettes, Pipe    Quit date: 03/24/1990  . Smokeless tobacco: Never Used  . Alcohol Use: No     Comment:    . Drug Use: No  . Sexual Activity: Not Currently    Birth Control/ Protection: None   Other Topics Concern  . Not on file   Social History Narrative   Married, lives w/ wife      Family History  Problem Relation Age of Onset  . Crohn's disease Brother   . Breast cancer Mother   . Colon cancer Neg Hx   . Prostate cancer Neg Hx   . Heart attack Father 79  . Dementia Sister   . Diabetes Sister   . Stroke Mother 64      Medication List       This list is accurate as of:  03/22/14 11:59 PM.  Always use your most recent med list.               acetaminophen 325 MG tablet  Commonly known as:  TYLENOL  Take 650 mg by mouth as needed for pain.     amLODipine 5 MG tablet  Commonly known as:  NORVASC  TAKE 1 TABLET DAILY     azelastine 0.1 % nasal spray  Commonly known as:  ASTELIN  Place 2 sprays into both nostrils at bedtime.     busPIRone 15 MG tablet  Commonly known as:  BUSPAR  TAKE 1 TABLET TWICE A DAY     clonazePAM 0.5 MG tablet  Commonly known as:  KLONOPIN  Take 1 tablet (0.5 mg total) by mouth 3 (three) times daily as needed for anxiety.     Cyanocobalamin 500 MCG/0.1ML Soln  Commonly known as:  NASCOBAL  USE ONE SPRAY NASALLY PER WEEK AS DIRECTED     ESBRIET 267 MG Caps  Generic drug:  Pirfenidone  Take 2 capsules by mouth 3 (three) times daily. 9 capsules per day maximum.     fexofenadine 180 MG tablet  Commonly known as:  ALLEGRA  Take 180 mg by mouth daily.     gemfibrozil 600 MG tablet  Commonly known as:  LOPID  TAKE 1 TABLET TWICE A DAY     mesalamine 250 MG CR capsule  Commonly known as:  PENTASA  Take 2 capsules (500 mg total) by mouth 2 (two) times daily.     nebivolol 5 MG tablet  Commonly known as:  BYSTOLIC  Take 1 tablet (5 mg total) by mouth daily.     OXYGEN  Inhale into the lungs. 2 liters     Vitamin D (Ergocalciferol) 50000 UNITS Caps capsule  Commonly known as:  DRISDOL  Take 50,000 Units by mouth every 7 (seven) days.     warfarin 2 MG tablet  Commonly known as:  COUMADIN  TAKE 1 TABLET AS DIRECTED           Objective:   Physical Exam BP 127/70 mmHg  Pulse 74  Temp(Src) 97.6 F (36.4 C) (Oral)  Ht 5\' 11"  (1.803 m)  Wt 149 lb 6 oz (67.756 kg)  BMI 20.84 kg/m2  SpO2 91% General -- alert, well-developed, NAD.  Neck --no thyromegaly   HEENT-- Not pale.  Lungs -- normal respiratory effort,  no intercostal retractions, no accessory muscle use, and normal breath sounds.  Heart-- normal  rate, regular rhythm, no murmur.  Abdomen-- Not distended, good bowel sounds,soft, non-tender. Palpable, not tender Ao at the upper abdomen.  Extremities-- no pretibial edema bilaterally  Neurologic--  alert & oriented X3. Speech normal, gait appropriate for age, tremor seems at baseline Psych-- Cognition and judgment appear intact. Cooperative with normal attention span and concentration. No anxious or depressed appearing.       Assessment & Plan:

## 2014-03-22 NOTE — Patient Instructions (Signed)
Get your blood work before you leave   Please come back to the office in 4 months for a routine check up       [place] cause injuries and can affect all age groups. It is possible to use preventive measures to significantly decrease the likelihood of falls. There are many simple measures which can make your home safer and prevent falls. OUTDOORS  Repair cracks and edges of walkways and driveways.  Remove high doorway thresholds.  Trim shrubbery on the main path into your home.  Have good outside lighting.  Clear walkways of tools, rocks, debris, and clutter.  Check that handrails are not broken and are securely fastened. Both sides of steps should have handrails.  Have leaves, snow, and ice cleared regularly.  Use sand or salt on walkways during winter months.  In the garage, clean up grease or oil spills. BATHROOM  Install night lights.  Install grab bars by the toilet and in the tub and shower.  Use non-skid mats or decals in the tub or shower.  Place a plastic non-slip stool in the shower to sit on, if needed.  Keep floors dry and clean up all water on the floor immediately.  Remove soap buildup in the tub or shower on a regular basis.  Secure bath mats with non-slip, double-sided rug tape.  Remove throw rugs and tripping hazards from the floors. BEDROOMS  Install night lights.  Make sure a bedside light is easy to reach.  Do not use oversized bedding.  Keep a telephone by your bedside.  Have a firm chair with side arms to use for getting dressed.  Remove throw rugs and tripping hazards from the floor. KITCHEN  Keep handles on pots and pans turned toward the center of the stove. Use back burners when possible.  Clean up spills quickly and allow time for drying.  Avoid walking on wet floors.  Avoid hot utensils and knives.  Position shelves so they are not too high or low.  Place commonly used objects within easy  reach.  If necessary, use a sturdy step stool with a grab bar when reaching.  Keep electrical cables out of the way.  Do not use floor polish or wax that makes floors slippery. If you must use wax, use non-skid floor wax.  Remove throw rugs and tripping hazards from the floor. STAIRWAYS  Never leave objects on stairs.  Place handrails on both sides of stairways and use them. Fix any loose handrails. Make sure handrails on both sides of the stairways are as long as the stairs.  Check carpeting to make sure it is firmly attached along stairs. Make repairs to worn or loose carpet promptly.  Avoid placing throw rugs at the top or bottom of stairways, or properly secure the rug with carpet tape to prevent slippage. Get rid of throw rugs, if possible.  Have an electrician put in a light switch at the top and bottom of the stairs. OTHER FALL PREVENTION TIPS  Wear low-heel or rubber-soled shoes that are supportive and fit well. Wear closed toe shoes.  When using a stepladder, make sure it is fully opened and both spreaders are firmly locked. Do not climb a closed stepladder.  Add color or contrast paint or tape to grab bars and handrails in your home. Place contrasting color strips on first and last steps.  Learn and use mobility aids as needed. Install an electrical emergency response system.  Turn on lights  to avoid dark areas. Replace light bulbs that burn out immediately. Get light switches that glow.  Arrange furniture to create clear pathways. Keep furniture in the same place.  Firmly attach carpet with non-skid or double-sided tape.  Eliminate uneven floor surfaces.  Select a carpet pattern that does not visually hide the edge of steps.  Be aware of all pets. OTHER HOME SAFETY TIPS  Set the water temperature for 120 F (48.8 C).  Keep emergency numbers on or near the telephone.  Keep smoke detectors on every level of the home and near sleeping areas. Document Released:  02/28/2002 Document Revised: 09/09/2011 Document Reviewed: 05/30/2011 [facility] Patient Information 2015 [place], [place]. This information is not intended to replace advice given to you by your health care provider. Make sure you discuss any questions you have with your health care provider.    Preventive Care for Adults   Blood pressure check.** / Every 1 to 2 years.  Lipid and cholesterol check.**/ Every 5 years beginning at age 100.  Lung cancer screening. / Every year if you are aged 8-80 years and have a 30-pack-year history of smoking and currently smoke or have quit within the past 15 years. Yearly screening is stopped once you have quit smoking for at least 15 years or develop a health problem that would prevent you from having lung cancer treatment.  Fecal occult blood test (FOBT) of stool. / Every year beginning at age 44 and continuing until age 36. You may not have to do this test if you get a colonoscopy every 10 years.  Flexible sigmoidoscopy** or colonoscopy.** / Every 5 years for a flexible sigmoidoscopy or every 10 years for a colonoscopy beginning at age 8 and continuing until age 69.  Hepatitis C blood test.** / For all people born from [birth year] through 1963 and any individual with known risks for hepatitis C.  Abdominal aortic aneurysm (AAA) screening.** / A one-time screening for ages 37 to 13 years who are current or former smokers.  Skin self-exam. / Monthly.  Influenza vaccine. / Every year.  Tetanus, diphtheria, and acellular pertussis (Tdap/Td) vaccine.** / 1 dose of Td every 10 years.  Varicella vaccine.** / Consult your health care provider.  Zoster vaccine.** / 1 dose for adults aged 63 years or older.  Pneumococcal 13-valent conjugate (PCV13) vaccine.** / Consult your health care provider.  Pneumococcal polysaccharide (PPSV23) vaccine.** / 1 dose for all adults aged 78 years and older.  Meningococcal vaccine.** / Consult your health care  provider.  Hepatitis A vaccine.** / Consult your health care provider.  Hepatitis B vaccine.** / Consult your health care provider.  Haemophilus influenzae type b (Hib) vaccine.** / Consult your health care provider. **Family history and personal history of risk and conditions may change your health care provider's recommendations. Document Released: 05/06/2001 Document Revised: 03/15/2013 Document Reviewed: 08/05/2010 [facility] Patient Information 2015 [place], [place]. This information is not intended to replace advice given to you by your health care provider. Make sure you discuss any questions you have with your health care provider.

## 2014-03-22 NOTE — Assessment & Plan Note (Addendum)
Recently bystolic dose was decreased to 5 mg, he continue with amlodipine, good control of BP, no change, check a BMP and TSH

## 2014-03-22 NOTE — Assessment & Plan Note (Signed)
Currently and there care of [facility], started ESBRIET ~ 8-15, doing well, no apparent side effects, very diligent doing exercises, uses oxygen as needed throughout the day and night, up to 4 L

## 2014-03-24 DIAGNOSIS — J84112 Idiopathic pulmonary fibrosis: Secondary | ICD-10-CM

## 2014-03-24 DIAGNOSIS — J439 Emphysema, unspecified: Secondary | ICD-10-CM

## 2014-03-24 HISTORY — DX: Emphysema, unspecified: J43.9

## 2014-03-24 HISTORY — DX: Idiopathic pulmonary fibrosis: J84.112

## 2014-03-30 ENCOUNTER — Ambulatory Visit (INDEPENDENT_AMBULATORY_CARE_PROVIDER_SITE_OTHER)
Admission: RE | Admit: 2014-03-30 | Discharge: 2014-03-30 | Disposition: A | Discharge status: Home / Self Care | Payer: Medicare Other | Source: Ambulatory Visit | Attending: Internal Medicine | Admitting: Internal Medicine

## 2014-03-30 DIAGNOSIS — M8580 Other specified disorders of bone density and structure, unspecified site: Secondary | ICD-10-CM

## 2014-03-31 ENCOUNTER — Other Ambulatory Visit: Payer: Self-pay | Admitting: Internal Medicine

## 2014-04-02 ENCOUNTER — Other Ambulatory Visit: Payer: Self-pay | Admitting: Internal Medicine

## 2014-04-03 ENCOUNTER — Ambulatory Visit (INDEPENDENT_AMBULATORY_CARE_PROVIDER_SITE_OTHER): Payer: Medicare Other

## 2014-04-03 DIAGNOSIS — I4891 Unspecified atrial fibrillation: Secondary | ICD-10-CM | POA: Diagnosis not present

## 2014-04-03 DIAGNOSIS — Z7901 Long term (current) use of anticoagulants: Secondary | ICD-10-CM | POA: Diagnosis not present

## 2014-04-03 LAB — POCT INR: INR: 1.9

## 2014-04-05 ENCOUNTER — Telehealth: Payer: Self-pay | Admitting: Cardiology

## 2014-04-05 NOTE — Telephone Encounter (Signed)
New Msg        Pt has questions about last visit to [facility]. Requesting to have his chart reviewed and called.

## 2014-04-05 NOTE — Telephone Encounter (Signed)
Returned call to pt, pt states he was seen on Monday 04/03/14 instructed to take 5mg  that day then start taking 3mg  daily except 4mg  on MF.  Pt forgot to take the boosted dose on Monday and only took 4mg .  Advised to to take an extra 1/2 tablet today, for a total of 4mg  then resume taking as directed at last OV  3mg  daily except 4mg  on MF.  Pt verbalized understanding.

## 2014-04-06 DIAGNOSIS — Z79899 Other long term (current) drug therapy: Secondary | ICD-10-CM | POA: Diagnosis not present

## 2014-04-13 DIAGNOSIS — R0600 Dyspnea, unspecified: Secondary | ICD-10-CM | POA: Diagnosis not present

## 2014-04-13 DIAGNOSIS — J849 Interstitial pulmonary disease, unspecified: Secondary | ICD-10-CM | POA: Diagnosis not present

## 2014-04-18 ENCOUNTER — Ambulatory Visit (INDEPENDENT_AMBULATORY_CARE_PROVIDER_SITE_OTHER): Payer: Medicare Other | Admitting: *Deleted

## 2014-04-18 DIAGNOSIS — I4891 Unspecified atrial fibrillation: Secondary | ICD-10-CM | POA: Diagnosis not present

## 2014-04-18 DIAGNOSIS — Z7901 Long term (current) use of anticoagulants: Secondary | ICD-10-CM

## 2014-04-18 LAB — POCT INR: INR: 2

## 2014-05-02 ENCOUNTER — Ambulatory Visit (INDEPENDENT_AMBULATORY_CARE_PROVIDER_SITE_OTHER): Payer: Medicare Other | Admitting: Pharmacist

## 2014-05-02 DIAGNOSIS — I4891 Unspecified atrial fibrillation: Secondary | ICD-10-CM | POA: Diagnosis not present

## 2014-05-02 DIAGNOSIS — Z7901 Long term (current) use of anticoagulants: Secondary | ICD-10-CM

## 2014-05-02 LAB — POCT INR: INR: 2.2

## 2014-05-23 ENCOUNTER — Ambulatory Visit (INDEPENDENT_AMBULATORY_CARE_PROVIDER_SITE_OTHER): Payer: Medicare Other

## 2014-05-23 DIAGNOSIS — Z7901 Long term (current) use of anticoagulants: Secondary | ICD-10-CM | POA: Diagnosis not present

## 2014-05-23 DIAGNOSIS — I4891 Unspecified atrial fibrillation: Secondary | ICD-10-CM

## 2014-05-23 LAB — POCT INR: INR: 3.1

## 2014-06-13 ENCOUNTER — Ambulatory Visit (INDEPENDENT_AMBULATORY_CARE_PROVIDER_SITE_OTHER): Payer: Medicare Other | Admitting: *Deleted

## 2014-06-13 DIAGNOSIS — I4891 Unspecified atrial fibrillation: Secondary | ICD-10-CM | POA: Diagnosis not present

## 2014-06-13 DIAGNOSIS — Z7901 Long term (current) use of anticoagulants: Secondary | ICD-10-CM | POA: Diagnosis not present

## 2014-06-13 LAB — POCT INR: INR: 2.1

## 2014-06-28 ENCOUNTER — Other Ambulatory Visit: Payer: Self-pay

## 2014-07-04 ENCOUNTER — Ambulatory Visit (INDEPENDENT_AMBULATORY_CARE_PROVIDER_SITE_OTHER): Payer: Medicare Other | Admitting: *Deleted

## 2014-07-04 DIAGNOSIS — I4891 Unspecified atrial fibrillation: Secondary | ICD-10-CM | POA: Diagnosis not present

## 2014-07-04 DIAGNOSIS — Z7901 Long term (current) use of anticoagulants: Secondary | ICD-10-CM | POA: Diagnosis not present

## 2014-07-04 DIAGNOSIS — Z79899 Other long term (current) drug therapy: Secondary | ICD-10-CM | POA: Diagnosis not present

## 2014-07-04 LAB — POCT INR: INR: 1.9

## 2014-07-04 MED ORDER — WARFARIN SODIUM 2 MG PO TABS: 2.0000 mg | ORAL_TABLET | ORAL | Status: DC

## 2014-07-12 ENCOUNTER — Other Ambulatory Visit: Payer: Self-pay | Admitting: Gastroenterology

## 2014-07-17 ENCOUNTER — Other Ambulatory Visit: Payer: Self-pay | Admitting: Internal Medicine

## 2014-07-20 ENCOUNTER — Other Ambulatory Visit: Payer: Self-pay

## 2014-07-25 ENCOUNTER — Ambulatory Visit (INDEPENDENT_AMBULATORY_CARE_PROVIDER_SITE_OTHER): Payer: Medicare Other | Admitting: Internal Medicine

## 2014-07-25 ENCOUNTER — Encounter: Payer: Self-pay | Admitting: Internal Medicine

## 2014-07-25 ENCOUNTER — Other Ambulatory Visit: Payer: Self-pay

## 2014-07-25 VITALS — BP 122/68 | HR 68 | Temp 97.5°F | Ht 71.0 in | Wt 145.2 lb

## 2014-07-25 DIAGNOSIS — I10 Essential (primary) hypertension: Secondary | ICD-10-CM | POA: Diagnosis not present

## 2014-07-25 DIAGNOSIS — M8580 Other specified disorders of bone density and structure, unspecified site: Secondary | ICD-10-CM | POA: Diagnosis not present

## 2014-07-25 DIAGNOSIS — F411 Generalized anxiety disorder: Secondary | ICD-10-CM

## 2014-07-25 MED ORDER — GEMFIBROZIL 600 MG PO TABS: 600.0000 mg | ORAL_TABLET | Freq: Two times a day (BID) | ORAL | Status: DC

## 2014-07-25 MED ORDER — AMLODIPINE BESYLATE 5 MG PO TABS: 5.0000 mg | ORAL_TABLET | Freq: Every day | ORAL | Status: DC

## 2014-07-25 NOTE — Patient Instructions (Signed)
Come back to the office in 6 months  for a routine check up

## 2014-07-25 NOTE — Progress Notes (Signed)
Subjective:    Patient ID: Brian [name], male    DOB: May 16, 1928, 81 y.o.   MRN: [id number]  DOS:  07/25/2014 Type of visit - description : rov Interval history:  Patient is a 81 year old male with a history of hypertension, hypertriglyceridemia, pulmonary fibrosis, AFib, and anxiety in today for routine medical care.  Since last appointment, patient has had a Dexa scan done which revealed a T score of -2.2. Since then he notes he has been taking Vitamin D and Calcium supplementation which is well-tolerated. His son is a Restaurant manager, fast food and has given him a number of weight-bearing exercises which he has been adhering to well.  Patient notes that at the last appointment we increased the bystolic dosage, which he has tolerated well. No chest pain or headaches.  Patient notes no complaints with Lopid for hypertriglyceridemia, he has been on this medication for years.  Anxiety is well-controlled, he has cut back his clonazepam usage to 1/4 pill once a day with plans of getting off of it completely. Patient notes no anxiety or depression.    Review of Systems  Constitutional: No fever, chills.  Respiratory: Mildly difficult breathing (at baseline) Cardiovascular: No CP, leg swelling or palpitations GI: no nausea, vomiting, diarrhea or abdominal pain.  No dysphagia or odynophagia. Musculoskeletal: No joint swellings or unusual aches or pains Neurological: No dizziness or headaches.   Past Medical History  Diagnosis Date  . Muscle tremor     saw neurology remotely elsewhere, was Rx inderal  . Crohn's ileocolitis   . Osteopenia     per DEXA 11/2008  . Hypertension 11/11    mild  . Hemorrhoids   . Hypertrophy of prostate     w/o UR obst & oth luts  . Peripheral neuropathy     h/o neg w/u  . Osteoarthritis     ankle,right  . ED (erectile dysfunction)   . Hypertriglyceridemia   . B12 deficiency   . COPD (chronic obstructive pulmonary disease)     recent dx--no acute problems  .  Skin cancer     squamous cell skin cancers-tx'd q46yrs  . Hiatal hernia 1956    found while in service  . Anxiety   . Atrial fibrillation with RVR   . Long term (current) use of anticoagulants 02/21/2011  . Serrated adenoma of colon 03/2010  . Renal cyst     bilateral  . Pulmonary fibrosis 2015    Rx Esbriet ~ 10-2013 ([name])  . On home oxygen therapy     Past Surgical History  Procedure Laterality Date  . Resection of distal ileum and cecum w/ appendectomy      18-inch small intestine, 1984 for Crohn's disease  . Colon surgery  1983    has chrohn's  . Appendectomy  1983  . Cholecystectomy  02/17/2011    Procedure: LAPAROSCOPIC CHOLECYSTECTOMY WITH INTRAOPERATIVE CHOLANGIOGRAM;  Surgeon: Edward Jolly, MD;  Location: [facility];  Service: General;  Laterality: N/A;  . Cataract extraction      bilateral ~ 2007    History   Social History  . Marital Status: Married    Spouse Name: Dalene Seltzer  . Number of Children: 4  . Years of Education: N/A   Occupational History  . Retired   . RETIRED    Social History Main Topics  . Smoking status: Former Smoker -- 1.00 packs/day for 40 years    Types: Cigarettes, Pipe    Quit date: 03/24/1990  . Smokeless  tobacco: Never Used  . Alcohol Use: No     Comment:    . Drug Use: No  . Sexual Activity: Not Currently    Birth Control/ Protection: None   Other Topics Concern  . Not on file   Social History Narrative   Married, lives w/ wife      Family History  Problem Relation Age of Onset  . Crohn's disease Brother   . Breast cancer Mother   . Colon cancer Neg Hx   . Prostate cancer Neg Hx   . Heart attack Father 75  . Dementia Sister   . Diabetes Sister   . Stroke Mother 36       Medication List       This list is accurate as of: 07/25/14 11:59 PM.  Always use your most recent med list.               acetaminophen 325 MG tablet  Commonly known as:  TYLENOL  Take 650 mg by mouth as needed for pain.     amLODipine  5 MG tablet  Commonly known as:  NORVASC  Take 1 tablet (5 mg total) by mouth daily.     azelastine 0.1 % nasal spray  Commonly known as:  ASTELIN  Place 2 sprays into both nostrils at bedtime.     busPIRone 15 MG tablet  Commonly known as:  BUSPAR  TAKE 1 TABLET TWICE A DAY     calcium citrate 950 MG tablet  Commonly known as:  CALCITRATE - dosed in mg elemental calcium  Take 200 mg of elemental calcium by mouth daily. Pt take QOD     clonazePAM 0.5 MG tablet  Commonly known as:  KLONOPIN  Take by mouth 2 (two) times daily as needed for anxiety. 1/4 tab     Cyanocobalamin 500 MCG/0.1ML Soln  Commonly known as:  NASCOBAL  USE ONE SPRAY NASALLY PER WEEK AS DIRECTED     NASCOBAL 500 MCG/0.1ML Soln  Generic drug:  Cyanocobalamin  USE 1 SPRAY NASALLY PER WEEK AS DIRECTED     fexofenadine 180 MG tablet  Commonly known as:  ALLEGRA  Take 180 mg by mouth daily.     gemfibrozil 600 MG tablet  Commonly known as:  LOPID  Take 1 tablet (600 mg total) by mouth 2 (two) times daily.     nebivolol 5 MG tablet  Commonly known as:  BYSTOLIC  Take 1 tablet (5 mg total) by mouth daily.     OXYGEN  Inhale into the lungs. 2 liters     PENTASA 250 MG CR capsule  Generic drug:  mesalamine  TAKE 2 CAPSULES TWICE A DAY     Pirfenidone 267 MG Caps  Take 3 capsules by mouth 3 (three) times daily.     Vitamin D (Ergocalciferol) 50000 UNITS Caps capsule  Commonly known as:  DRISDOL  Take 50,000 Units by mouth every 7 (seven) days.     warfarin 2 MG tablet  Commonly known as:  COUMADIN  Take 1 tablet (2 mg total) by mouth as directed.           Objective:   Physical Exam BP 122/68 mmHg  Pulse 68  Temp(Src) 97.5 F (36.4 C) (Oral)  Ht 5\' 11"  (1.803 m)  Wt 145 lb 4 oz (65.885 kg)  BMI 20.27 kg/m2  SpO2 96%  General:   Well developed, well nourished . NAD.  HEENT:  Normocephalic . Face symmetric, atraumatic Lungs:  Decreased breath sounds bilaterally Normal respiratory  effort, no intercostal retractions, no accessory muscle use. Heart: RRR,  no murmur, distal pulses intact and significant for irregular rhythm. Muscle skeletal: no pretibial edema bilaterally  Skin: Not pale. Not jaundice Neurologic:  alert & oriented X3.  Speech normal, gait appropriate for age and unassisted Psych--  Cognition and judgment appear intact.  Cooperative with normal attention span and concentration.  Behavior appropriate. No anxious or depressed appearing.       Assessment & Plan:     AFib: Monitored by Dr. Aundra Dubin at cardiology.   Hypertriglyceridemia: Well-controlled on Lopid. Patient tolerating medication well. No changes to current regimen.  Pulmonary Fibrosis: Currently managed with Esbriet. Care provided by [facility]. Next appointment with them on 08/08/14.

## 2014-07-25 NOTE — Progress Notes (Signed)
Pre visit review using our clinic review tool, if applicable. No additional management support is needed unless otherwise documented below in the visit note. 

## 2014-07-26 NOTE — Assessment & Plan Note (Signed)
Osteopenia: Last DEXA scan revealed T-score of -2.2.  No h/o fractures . Patient is currently taking Vitamin D and Calcium supplementation as well as weight bearing exercises to increase bone density.  Plan: Continue current treatment plan of vitamin D, calcium, and weight-bearing activity.

## 2014-07-26 NOTE — Assessment & Plan Note (Signed)
HTN:  Well-controlled on current medications, we increased bystolic at last appointment, patient tolerating well. Continue current regimen.

## 2014-07-26 NOTE — Assessment & Plan Note (Signed)
Patient has been taking Clonazepam but has steadily been tapering off the dose. Currently down to 1/4 tablet once daily.  Continue current regimen, plan to discuss clonazepam d/c at next appointment.

## 2014-07-27 ENCOUNTER — Ambulatory Visit (INDEPENDENT_AMBULATORY_CARE_PROVIDER_SITE_OTHER): Payer: Medicare Other

## 2014-07-27 DIAGNOSIS — I4891 Unspecified atrial fibrillation: Secondary | ICD-10-CM | POA: Diagnosis not present

## 2014-07-27 DIAGNOSIS — Z7901 Long term (current) use of anticoagulants: Secondary | ICD-10-CM

## 2014-07-27 LAB — POCT INR: INR: 2.3

## 2014-08-08 DIAGNOSIS — J849 Interstitial pulmonary disease, unspecified: Secondary | ICD-10-CM | POA: Diagnosis not present

## 2014-08-08 DIAGNOSIS — J439 Emphysema, unspecified: Secondary | ICD-10-CM | POA: Diagnosis not present

## 2014-08-08 DIAGNOSIS — I7300 Raynaud's syndrome without gangrene: Secondary | ICD-10-CM | POA: Diagnosis not present

## 2014-08-08 DIAGNOSIS — J84112 Idiopathic pulmonary fibrosis: Secondary | ICD-10-CM | POA: Diagnosis not present

## 2014-08-24 ENCOUNTER — Ambulatory Visit (INDEPENDENT_AMBULATORY_CARE_PROVIDER_SITE_OTHER): Payer: Medicare Other | Admitting: *Deleted

## 2014-08-24 DIAGNOSIS — Z7901 Long term (current) use of anticoagulants: Secondary | ICD-10-CM | POA: Diagnosis not present

## 2014-08-24 DIAGNOSIS — I4891 Unspecified atrial fibrillation: Secondary | ICD-10-CM | POA: Diagnosis not present

## 2014-08-24 LAB — POCT INR: INR: 2.2

## 2014-09-17 ENCOUNTER — Other Ambulatory Visit: Payer: Self-pay | Admitting: Internal Medicine

## 2014-09-21 ENCOUNTER — Ambulatory Visit (INDEPENDENT_AMBULATORY_CARE_PROVIDER_SITE_OTHER): Payer: Medicare Other | Admitting: *Deleted

## 2014-09-21 DIAGNOSIS — Z7901 Long term (current) use of anticoagulants: Secondary | ICD-10-CM

## 2014-09-21 DIAGNOSIS — I4891 Unspecified atrial fibrillation: Secondary | ICD-10-CM

## 2014-09-21 LAB — POCT INR: INR: 2.8

## 2014-10-04 ENCOUNTER — Other Ambulatory Visit: Payer: Self-pay | Admitting: *Deleted

## 2014-10-04 MED ORDER — WARFARIN SODIUM 2 MG PO TABS: 2.0000 mg | ORAL_TABLET | ORAL | Status: DC

## 2014-10-04 NOTE — Telephone Encounter (Signed)
Patient requests twelve tablets to get him through until his ninety day supply arrives from the mail order. Thanks, MI

## 2014-10-09 DIAGNOSIS — Z87891 Personal history of nicotine dependence: Secondary | ICD-10-CM | POA: Diagnosis not present

## 2014-10-09 DIAGNOSIS — J84112 Idiopathic pulmonary fibrosis: Secondary | ICD-10-CM | POA: Diagnosis not present

## 2014-10-09 DIAGNOSIS — J439 Emphysema, unspecified: Secondary | ICD-10-CM | POA: Diagnosis not present

## 2014-10-09 DIAGNOSIS — J849 Interstitial pulmonary disease, unspecified: Secondary | ICD-10-CM | POA: Diagnosis not present

## 2014-10-09 DIAGNOSIS — R0602 Shortness of breath: Secondary | ICD-10-CM | POA: Diagnosis not present

## 2014-10-09 LAB — PULMONARY FUNCTION TEST

## 2014-10-10 ENCOUNTER — Other Ambulatory Visit: Payer: Self-pay | Admitting: Gastroenterology

## 2014-10-11 ENCOUNTER — Other Ambulatory Visit: Payer: Self-pay

## 2014-10-13 ENCOUNTER — Encounter: Payer: Self-pay | Admitting: Gastroenterology

## 2014-10-26 ENCOUNTER — Ambulatory Visit (INDEPENDENT_AMBULATORY_CARE_PROVIDER_SITE_OTHER): Payer: Medicare Other | Admitting: *Deleted

## 2014-10-26 DIAGNOSIS — Z7901 Long term (current) use of anticoagulants: Secondary | ICD-10-CM

## 2014-10-26 DIAGNOSIS — I4891 Unspecified atrial fibrillation: Secondary | ICD-10-CM

## 2014-10-26 LAB — POCT INR: INR: 3

## 2014-11-22 ENCOUNTER — Other Ambulatory Visit: Payer: Self-pay | Admitting: Internal Medicine

## 2014-12-07 ENCOUNTER — Ambulatory Visit (INDEPENDENT_AMBULATORY_CARE_PROVIDER_SITE_OTHER): Payer: Medicare Other

## 2014-12-07 DIAGNOSIS — I4891 Unspecified atrial fibrillation: Secondary | ICD-10-CM

## 2014-12-07 DIAGNOSIS — Z7901 Long term (current) use of anticoagulants: Secondary | ICD-10-CM

## 2014-12-07 LAB — POCT INR: INR: 2.7

## 2014-12-12 ENCOUNTER — Ambulatory Visit (INDEPENDENT_AMBULATORY_CARE_PROVIDER_SITE_OTHER): Payer: Medicare Other | Admitting: Gastroenterology

## 2014-12-12 ENCOUNTER — Encounter: Payer: Self-pay | Admitting: Gastroenterology

## 2014-12-12 VITALS — BP 102/64 | HR 60 | Ht 71.0 in | Wt 144.0 lb

## 2014-12-12 DIAGNOSIS — K5080 Crohn's disease of both small and large intestine without complications: Secondary | ICD-10-CM | POA: Diagnosis not present

## 2014-12-12 MED ORDER — MESALAMINE ER 250 MG PO CPCR: 500.0000 mg | ORAL_CAPSULE | Freq: Two times a day (BID) | ORAL | Status: DC

## 2014-12-12 NOTE — Assessment & Plan Note (Signed)
No active symptoms attributable to Crohn's disease. Continue Pentasa at current dosage. Return office visit one year. I spent 15 minutes of face-to-face time with the patient. Greater than 50% of the time was spent counseling and coordinating care.

## 2014-12-12 NOTE — Progress Notes (Signed)
    History of Present Illness: This is an 81 year old male returning for follow-up of Crohn's ileocolitis. He has no ongoing gastrointestinal complaints except for slightly diminished appetite and a slight weight loss that he attributes to pirfenidone. Denies abdominal pain, constipation, diarrhea, change in stool caliber, melena, hematochezia, nausea, vomiting, dysphagia, reflux symptoms, chest pain.  Current Medications, Allergies, Past Medical History, Past Surgical History, Family History and Social History were reviewed in [employer] record.  Physical Exam: General: Well developed, thin, no acute distress Head: Normocephalic and atraumatic Eyes:  sclerae anicteric, EOMI Ears: Normal auditory acuity Mouth: No deformity or lesions Lungs: Clear throughout to auscultation Heart: Regular rate and rhythm; no murmurs, rubs or bruits Abdomen: Soft, non tender and non distended. No masses, hepatosplenomegaly or hernias noted. Normal Bowel sounds Musculoskeletal: Symmetrical with no gross deformities  Pulses:  Normal pulses noted Extremities: No clubbing, cyanosis, edema or deformities noted Neurological: Alert oriented x 4, grossly nonfocal Psychological:  Alert and cooperative. Normal mood and affect  Assessment and Recommendations:

## 2014-12-12 NOTE — Patient Instructions (Signed)
We have sent the following medications to your pharmacy for you to pick up at your convenience: Pentasa.  Thank you for choosing me and [facility].  Malcolm T. Stark, Jr., MD., FACG    

## 2014-12-13 ENCOUNTER — Ambulatory Visit (INDEPENDENT_AMBULATORY_CARE_PROVIDER_SITE_OTHER): Payer: Medicare Other | Admitting: Internal Medicine

## 2014-12-13 ENCOUNTER — Encounter: Payer: Self-pay | Admitting: Internal Medicine

## 2014-12-13 VITALS — BP 108/66 | HR 70 | Temp 97.6°F | Ht 71.0 in | Wt 145.1 lb

## 2014-12-13 DIAGNOSIS — Z09 Encounter for follow-up examination after completed treatment for conditions other than malignant neoplasm: Secondary | ICD-10-CM | POA: Diagnosis not present

## 2014-12-13 DIAGNOSIS — F411 Generalized anxiety disorder: Secondary | ICD-10-CM | POA: Diagnosis not present

## 2014-12-13 DIAGNOSIS — M159 Polyosteoarthritis, unspecified: Secondary | ICD-10-CM

## 2014-12-13 DIAGNOSIS — M150 Primary generalized (osteo)arthritis: Secondary | ICD-10-CM

## 2014-12-13 NOTE — Patient Instructions (Addendum)
Come back by December for a physical exam, fasting

## 2014-12-13 NOTE — Progress Notes (Signed)
Subjective:    Patient ID: Brian [name], male    DOB: 05/05/1928, 81 y.o.   MRN: [id number]  DOS:  12/13/2014 Type of visit - description : Acute visit Interval history: For the last 2 weeks has experienced pain in a symmetric fashion located at the anterior superior iliac spine. Pain is usually at night, sharp, wakes him. He remains extremely active, goes to the gym 3 times a week and exercises including leg press. He walks on average 7 to 8 miles a week as well.   Review of Systems Denies lower extremity paresthesias. No abdominal pain. Occasional back pain but is not severe. No rash at the pelvic area.  Past Medical History  Diagnosis Date  . Muscle tremor     saw neurology remotely elsewhere, was Rx inderal  . Crohn's ileocolitis   . Osteopenia     per DEXA 11/2008  . Hypertension 11/11    mild  . Hemorrhoids   . Hypertrophy of prostate     w/o UR obst & oth luts  . Peripheral neuropathy     h/o neg w/u  . Osteoarthritis     ankle,right  . ED (erectile dysfunction)   . Hypertriglyceridemia   . B12 deficiency   . COPD (chronic obstructive pulmonary disease)     recent dx--no acute problems  . Skin cancer     squamous cell skin cancers-tx'd q1yrs  . Hiatal hernia 1956    found while in service  . Anxiety   . Atrial fibrillation with RVR   . Long term (current) use of anticoagulants 02/21/2011  . Serrated adenoma of colon 03/2010  . Renal cyst     bilateral  . Pulmonary fibrosis 2015    Rx Esbriet ~ 10-2013 ([name]), Dr. [name]  . On home oxygen therapy     Currently at 2 LPM  . Idiopathic pulmonary fibrosis 2016    Dr. [name]  . Emphysema lung 2016    Dr. [name]    Past Surgical History  Procedure Laterality Date  . Resection of distal ileum and cecum w/ appendectomy      18-inch small intestine, 1984 for Crohn's disease  . Colon surgery  1983    has chrohn's  . Appendectomy  1983  . Cholecystectomy  02/17/2011    Procedure: LAPAROSCOPIC  CHOLECYSTECTOMY WITH INTRAOPERATIVE CHOLANGIOGRAM;  Surgeon: Edward Jolly, MD;  Location: [facility];  Service: General;  Laterality: N/A;  . Cataract extraction      bilateral ~ 2007    Social History   Social History  . Marital Status: Married    Spouse Name: Dalene Seltzer  . Number of Children: 4  . Years of Education: N/A   Occupational History  . Retired   . RETIRED    Social History Main Topics  . Smoking status: Former Smoker -- 1.00 packs/day for 40 years    Types: Cigarettes, Pipe    Quit date: 03/24/1990  . Smokeless tobacco: Never Used  . Alcohol Use: No     Comment:    . Drug Use: No  . Sexual Activity: Not Currently    Birth Control/ Protection: None   Other Topics Concern  . Not on file   Social History Narrative   Married, lives w/ wife         Medication List       This list is accurate as of: 12/13/14 11:59 PM.  Always use your most recent med list.  acetaminophen 325 MG tablet  Commonly known as:  TYLENOL  Take 650 mg by mouth as needed for pain.     amLODipine 5 MG tablet  Commonly known as:  NORVASC  Take 1 tablet (5 mg total) by mouth daily.     busPIRone 15 MG tablet  Commonly known as:  BUSPAR  Take 1 tablet (15 mg total) by mouth 2 (two) times daily.     calcium citrate 950 MG tablet  Commonly known as:  CALCITRATE - dosed in mg elemental calcium  Take 200 mg of elemental calcium by mouth daily. Pt take QOD     Cyanocobalamin 500 MCG/0.1ML Soln  Commonly known as:  NASCOBAL  Use one spray nasally per week as directed.     gemfibrozil 600 MG tablet  Commonly known as:  LOPID  Take 1 tablet (600 mg total) by mouth 2 (two) times daily.     mesalamine 250 MG CR capsule  Commonly known as:  PENTASA  Take 2 capsules (500 mg total) by mouth 2 (two) times daily.     nebivolol 5 MG tablet  Commonly known as:  BYSTOLIC  Take 1 tablet (5 mg total) by mouth daily.     OXYGEN  Inhale into the lungs. 2 liters      Pirfenidone 267 MG Caps  Take 3 capsules by mouth 3 (three) times daily.     Vitamin D (Ergocalciferol) 50000 UNITS Caps capsule  Commonly known as:  DRISDOL  Take 50,000 Units by mouth every 7 (seven) days.     warfarin 2 MG tablet  Commonly known as:  COUMADIN  Take 1 tablet (2 mg total) by mouth as directed.           Objective:   Physical Exam BP 108/66 mmHg  Pulse 70  Temp(Src) 97.6 F (36.4 C) (Oral)  Ht 5\' 11"  (1.803 m)  Wt 145 lb 2 oz (65.828 kg)  BMI 20.25 kg/m2  SpO2 96% General:   Well developed, well nourished . NAD.  HEENT:  Normocephalic . Face symmetric, atraumatic Abdomen: Nontender, nondistended, soft. No mass Pelvis: Area of perceived pain is normal to palpation without rash, no TTP. Groin: No LADs  Skin: Not pale. Not jaundice Neurologic:  alert & oriented X3.  Speech normal, gait appropriate for age and unassisted Psych--  Cognition and judgment appear intact.  Cooperative with normal attention span and concentration.  Behavior appropriate. No anxious or depressed appearing.      Assessment & Plan:   Assessment > HTN Hypertriglyceridemia Anxiety Atrial fibrillation anticoagulated Pulmonary: --Pulmonary fibrosis, home oxygen --Emphysema Tremors Crohn's colitis DJD Osteopenia DEXA 2010, DEXA 03-2014 T score -2.2, on calcium and vitamin D BPH Peripheral  neuropathy previous workup negative Skin cancer SCC B12 deficiency -- nascobal  Plan  DJD, MSK: Has pain located at the anterior superior iliac spine for the last 3 weeks as described in the history of present illness, exam is normal. He is extremely active for his age and it may be tendinitis of the rectus femoris muscle. States the pain is not severe, does not desire any pain medication. Recommend to use OTC icy hot that in the past he reports helped w/  similar problems. Primary care: Declined a flu shot Anxiety: Decided not to take lorazepam, doing well

## 2014-12-13 NOTE — Progress Notes (Signed)
Pre visit review using our clinic review tool, if applicable. No additional management support is needed unless otherwise documented below in the visit note. 

## 2014-12-14 DIAGNOSIS — Z09 Encounter for follow-up examination after completed treatment for conditions other than malignant neoplasm: Secondary | ICD-10-CM | POA: Insufficient documentation

## 2014-12-14 MED ORDER — CYANOCOBALAMIN 500 MCG/0.1ML NA SOLN: NASAL | Status: DC

## 2014-12-14 NOTE — Assessment & Plan Note (Signed)
DJD, MSK: Has pain located at the anterior superior iliac spine for the last 3 weeks as described in the history of present illness, exam is normal. He is extremely active for his age and it may be tendinitis of the rectus femoris muscle. States the pain is not severe, does not desire any pain medication. Recommend to use OTC icy hot that in the past he reports helped w/  similar problems. Primary care: Declined a flu shot Anxiety: Decided not to take lorazepam, doing well

## 2014-12-26 ENCOUNTER — Other Ambulatory Visit: Payer: Self-pay | Admitting: Cardiology

## 2015-01-04 DIAGNOSIS — J84112 Idiopathic pulmonary fibrosis: Secondary | ICD-10-CM | POA: Diagnosis not present

## 2015-01-04 DIAGNOSIS — R0602 Shortness of breath: Secondary | ICD-10-CM | POA: Diagnosis not present

## 2015-01-04 DIAGNOSIS — J439 Emphysema, unspecified: Secondary | ICD-10-CM | POA: Diagnosis not present

## 2015-01-05 DIAGNOSIS — Z23 Encounter for immunization: Secondary | ICD-10-CM | POA: Diagnosis not present

## 2015-01-18 ENCOUNTER — Ambulatory Visit (INDEPENDENT_AMBULATORY_CARE_PROVIDER_SITE_OTHER): Payer: Medicare Other | Admitting: *Deleted

## 2015-01-18 DIAGNOSIS — I4891 Unspecified atrial fibrillation: Secondary | ICD-10-CM

## 2015-01-18 DIAGNOSIS — Z7901 Long term (current) use of anticoagulants: Secondary | ICD-10-CM

## 2015-01-18 LAB — POCT INR: INR: 2.2

## 2015-01-24 ENCOUNTER — Encounter: Payer: Self-pay | Admitting: Internal Medicine

## 2015-01-24 ENCOUNTER — Ambulatory Visit (INDEPENDENT_AMBULATORY_CARE_PROVIDER_SITE_OTHER): Payer: Medicare Other | Admitting: Internal Medicine

## 2015-01-24 VITALS — BP 126/82 | HR 80 | Temp 98.2°F | Ht 71.0 in | Wt 142.1 lb

## 2015-01-24 DIAGNOSIS — F411 Generalized anxiety disorder: Secondary | ICD-10-CM

## 2015-01-24 DIAGNOSIS — Z09 Encounter for follow-up examination after completed treatment for conditions other than malignant neoplasm: Secondary | ICD-10-CM | POA: Diagnosis not present

## 2015-01-24 MED ORDER — CLONAZEPAM 0.5 MG PO TABS: 0.2500 mg | ORAL_TABLET | Freq: Three times a day (TID) | ORAL | Status: DC | PRN

## 2015-01-24 NOTE — Progress Notes (Signed)
Pre visit review using our clinic review tool, if applicable. No additional management support is needed unless otherwise documented below in the visit note. 

## 2015-01-24 NOTE — Patient Instructions (Addendum)
Take clonazepam as needed, watch for drowsiness  If you need more help, please call the office any time

## 2015-01-24 NOTE — Progress Notes (Signed)
Subjective:    Patient ID: Brian [name], male    DOB: 05-09-1928, 81 y.o.   MRN: [id number]  DOS:  01/24/2015 Type of visit - description : Acute visit Interval history: Not feeling well for the last 5 days: Sad, depressed, lost of interest in things. All he likes to do is seat in his chair. Lack of appetite. Lost a brother 12/21/2014, also lost 2 close friends lately.   Wt Readings from Last 3 Encounters:  01/24/15 142 lb 2 oz (64.467 kg)  12/13/14 145 lb 2 oz (65.828 kg)  12/12/14 144 lb (65.318 kg)     Review of Systems Denies chest pain or difficulty breathing No nausea, vomiting, diarrhea or blood in the stools. Denies suicidal ideas. Not sleeping well most nights.  Past Medical History  Diagnosis Date  . Muscle tremor     saw neurology remotely elsewhere, was Rx inderal  . Crohn's ileocolitis ([place])   . Osteopenia     per DEXA 11/2008  . Hypertension 11/11    mild  . Hemorrhoids   . Hypertrophy of prostate     w/o UR obst & oth luts  . Peripheral neuropathy (HCC)     h/o neg w/u  . Osteoarthritis     ankle,right  . ED (erectile dysfunction)   . Hypertriglyceridemia   . B12 deficiency   . COPD (chronic obstructive pulmonary disease) ([place])     recent dx--no acute problems  . Skin cancer     squamous cell skin cancers-tx'd q47yrs  . Hiatal hernia 1956    found while in service  . Anxiety   . Atrial fibrillation with RVR ([place])   . Long term (current) use of anticoagulants 02/21/2011  . Serrated adenoma of colon 03/2010  . Renal cyst     bilateral  . Pulmonary fibrosis ([place]) 2015    Rx Esbriet ~ 10-2013 ([name]), Dr. [name]  . On home oxygen therapy     Currently at 2 LPM  . Idiopathic pulmonary fibrosis ([place]) 2016    Dr. [name]  . Emphysema lung ([place]) 2016    Dr. [name]    Past Surgical History  Procedure Laterality Date  . Resection of distal ileum and cecum w/ appendectomy      18-inch small intestine, 1984 for Crohn's disease  . Colon surgery   1983    has chrohn's  . Appendectomy  1983  . Cholecystectomy  02/17/2011    Procedure: LAPAROSCOPIC CHOLECYSTECTOMY WITH INTRAOPERATIVE CHOLANGIOGRAM;  Surgeon: Edward Jolly, MD;  Location: [facility];  Service: General;  Laterality: N/A;  . Cataract extraction      bilateral ~ 2007    Social History   Social History  . Marital Status: Married    Spouse Name: Dalene Seltzer  . Number of Children: 4  . Years of Education: N/A   Occupational History  . Retired   . RETIRED    Social History Main Topics  . Smoking status: Former Smoker -- 1.00 packs/day for 40 years    Types: Cigarettes, Pipe    Quit date: 03/24/1990  . Smokeless tobacco: Never Used  . Alcohol Use: No     Comment:    . Drug Use: No  . Sexual Activity: Not Currently    Birth Control/ Protection: None   Other Topics Concern  . Not on file   Social History Narrative   Married, lives w/ wife         Medication List  This list is accurate as of: 01/24/15 11:59 PM.  Always use your most recent med list.               acetaminophen 325 MG tablet  Commonly known as:  TYLENOL  Take 650 mg by mouth as needed for pain.     amLODipine 5 MG tablet  Commonly known as:  NORVASC  Take 1 tablet (5 mg total) by mouth daily.     busPIRone 15 MG tablet  Commonly known as:  BUSPAR  Take 1 tablet (15 mg total) by mouth 2 (two) times daily.     calcium citrate 950 MG tablet  Commonly known as:  CALCITRATE - dosed in mg elemental calcium  Take 200 mg of elemental calcium by mouth daily. Pt take QOD     clonazePAM 0.5 MG tablet  Commonly known as:  KLONOPIN  Take 0.5 tablets (0.25 mg total) by mouth 3 (three) times daily as needed for anxiety.     Cyanocobalamin 500 MCG/0.1ML Soln  Commonly known as:  NASCOBAL  Use one spray nasally per week as directed.     gemfibrozil 600 MG tablet  Commonly known as:  LOPID  Take 1 tablet (600 mg total) by mouth 2 (two) times daily.     mesalamine 250 MG CR capsule   Commonly known as:  PENTASA  Take 2 capsules (500 mg total) by mouth 2 (two) times daily.     nebivolol 5 MG tablet  Commonly known as:  BYSTOLIC  Take 1 tablet (5 mg total) by mouth daily.     OXYGEN  Inhale into the lungs. 2 liters     Pirfenidone 267 MG Caps  Take 3 capsules by mouth 3 (three) times daily.     psyllium 28 % packet  Commonly known as:  METAMUCIL SMOOTH TEXTURE  Take 1 packet by mouth daily as needed.     Vitamin D (Ergocalciferol) 50000 UNITS Caps capsule  Commonly known as:  DRISDOL  Take 50,000 Units by mouth every 7 (seven) days.     warfarin 2 MG tablet  Commonly known as:  COUMADIN  Take 1 tablet (2 mg total) by mouth as directed.           Objective:   Physical Exam BP 126/82 mmHg  Pulse 80  Temp(Src) 98.2 F (36.8 C) (Oral)  Ht 5\' 11"  (1.803 m)  Wt 142 lb 2 oz (64.467 kg)  BMI 19.83 kg/m2  SpO2 96% General:   Well developed, well nourished . NAD.  HEENT:  Normocephalic . Face symmetric, atraumatic Skin: Not pale. Not jaundice Neurologic:  alert & oriented X3.  Speech normal, gait appropriate for age and unassisted Psych--  Cognition and judgment appear intact.  Cooperative with normal attention span and concentration.  Behavior appropriate. No anxious , + depress  appearing.      Assessment & Plan:   Assessment > HTN Hypertriglyceridemia Anxiety, on chronic Buspar , sx controlled  Depression 01-2015 after lost a brother  Atrial fibrillation anticoagulated Pulmonary: --Pulmonary fibrosis, home oxygen,   OV @ [name] 12-2014, stable  --Emphysema Tremors Crohn's colitis DJD Osteopenia DEXA 2010, DEXA 03-2014 T score -2.2, on calcium and vitamin D BPH Peripheral  neuropathy previous workup negative Skin cancer SCC B12 deficiency -- nascobal   Plan  Anxiety depression: The patient lost a brother and 2 good friends lately, obviously depressed, not suicidal. We had extensive discussion about his symptoms, he is counseled to  the best of  my ability. Options include counseling, medication or simply wait. In the past he took clonazepam and he restarted a very low-dose (1/4 tablet as needed) and that seems to help. We agreed to continue with BuSpar, low dose of clonazepam, will contact the office if he feels he needs something different like, SSRI or see a counselor again.  Today, I spent more than 15   min with the patient: >50% of the time counseling regards depression , treatment options

## 2015-01-25 NOTE — Assessment & Plan Note (Signed)
Anxiety depression: The patient lost a brother and 2 good friends lately, obviously depressed, not suicidal. We had extensive discussion about his symptoms, he is counseled to the best of my ability. Options include counseling, medication or simply wait. In the past he took clonazepam and he restarted a very low-dose (1/4 tablet as needed) and that seems to help. We agreed to continue with BuSpar, low dose of clonazepam, will contact the office if he feels he needs something different like, SSRI or see a counselor again.

## 2015-01-26 ENCOUNTER — Encounter (HOSPITAL_COMMUNITY): Payer: Self-pay | Admitting: *Deleted

## 2015-01-26 ENCOUNTER — Other Ambulatory Visit: Payer: Self-pay

## 2015-01-26 ENCOUNTER — Inpatient Hospital Stay (HOSPITAL_COMMUNITY)
Admission: EM | Admit: 2015-01-26 | Discharge: 2015-01-27 | DRG: 193 | Disposition: A | Discharge status: Home / Self Care | Payer: Medicare Other | Attending: Internal Medicine | Admitting: Internal Medicine

## 2015-01-26 ENCOUNTER — Emergency Department (HOSPITAL_COMMUNITY): Payer: Medicare Other

## 2015-01-26 DIAGNOSIS — J8410 Pulmonary fibrosis, unspecified: Secondary | ICD-10-CM

## 2015-01-26 DIAGNOSIS — F419 Anxiety disorder, unspecified: Secondary | ICD-10-CM | POA: Diagnosis present

## 2015-01-26 DIAGNOSIS — I10 Essential (primary) hypertension: Secondary | ICD-10-CM | POA: Diagnosis present

## 2015-01-26 DIAGNOSIS — J9621 Acute and chronic respiratory failure with hypoxia: Secondary | ICD-10-CM | POA: Diagnosis present

## 2015-01-26 DIAGNOSIS — J449 Chronic obstructive pulmonary disease, unspecified: Secondary | ICD-10-CM | POA: Diagnosis present

## 2015-01-26 DIAGNOSIS — R0602 Shortness of breath: Secondary | ICD-10-CM | POA: Diagnosis not present

## 2015-01-26 DIAGNOSIS — I482 Chronic atrial fibrillation: Secondary | ICD-10-CM | POA: Diagnosis present

## 2015-01-26 DIAGNOSIS — Z87891 Personal history of nicotine dependence: Secondary | ICD-10-CM

## 2015-01-26 DIAGNOSIS — J84112 Idiopathic pulmonary fibrosis: Secondary | ICD-10-CM | POA: Diagnosis present

## 2015-01-26 DIAGNOSIS — K5080 Crohn's disease of both small and large intestine without complications: Secondary | ICD-10-CM | POA: Diagnosis present

## 2015-01-26 DIAGNOSIS — J189 Pneumonia, unspecified organism: Secondary | ICD-10-CM | POA: Diagnosis not present

## 2015-01-26 DIAGNOSIS — Z7901 Long term (current) use of anticoagulants: Secondary | ICD-10-CM | POA: Diagnosis not present

## 2015-01-26 DIAGNOSIS — E785 Hyperlipidemia, unspecified: Secondary | ICD-10-CM | POA: Diagnosis present

## 2015-01-26 DIAGNOSIS — E538 Deficiency of other specified B group vitamins: Secondary | ICD-10-CM | POA: Diagnosis present

## 2015-01-26 DIAGNOSIS — Z9981 Dependence on supplemental oxygen: Secondary | ICD-10-CM | POA: Diagnosis not present

## 2015-01-26 DIAGNOSIS — I4891 Unspecified atrial fibrillation: Secondary | ICD-10-CM | POA: Diagnosis not present

## 2015-01-26 DIAGNOSIS — E876 Hypokalemia: Secondary | ICD-10-CM | POA: Insufficient documentation

## 2015-01-26 DIAGNOSIS — K219 Gastro-esophageal reflux disease without esophagitis: Secondary | ICD-10-CM | POA: Diagnosis present

## 2015-01-26 DIAGNOSIS — N400 Enlarged prostate without lower urinary tract symptoms: Secondary | ICD-10-CM | POA: Diagnosis present

## 2015-01-26 DIAGNOSIS — Z66 Do not resuscitate: Secondary | ICD-10-CM | POA: Diagnosis present

## 2015-01-26 DIAGNOSIS — Z85828 Personal history of other malignant neoplasm of skin: Secondary | ICD-10-CM | POA: Diagnosis not present

## 2015-01-26 DIAGNOSIS — R0902 Hypoxemia: Secondary | ICD-10-CM

## 2015-01-26 HISTORY — DX: Depression, unspecified: F32.A

## 2015-01-26 HISTORY — DX: Vitamin B12 deficiency anemia, unspecified: D51.9

## 2015-01-26 HISTORY — DX: Pneumonia, unspecified organism: J18.9

## 2015-01-26 HISTORY — DX: Squamous cell carcinoma of skin of unspecified parts of face: C44.320

## 2015-01-26 HISTORY — DX: Squamous cell carcinoma of skin of scalp and neck: C44.42

## 2015-01-26 HISTORY — DX: Major depressive disorder, single episode, unspecified: F32.9

## 2015-01-26 HISTORY — DX: Malignant melanoma of scalp and neck: C43.4

## 2015-01-26 LAB — BASIC METABOLIC PANEL
Anion gap: 10 (ref 5–15)
BUN: 17 mg/dL (ref 6–20)
CHLORIDE: 105 mmol/L (ref 101–111)
CO2: 22 mmol/L (ref 22–32)
Calcium: 9 mg/dL (ref 8.9–10.3)
Creatinine, Ser: 0.99 mg/dL (ref 0.61–1.24)
Glucose, Bld: 143 mg/dL — ABNORMAL HIGH (ref 65–99)
POTASSIUM: 3.2 mmol/L — AB (ref 3.5–5.1)
SODIUM: 137 mmol/L (ref 135–145)

## 2015-01-26 LAB — CBC WITH DIFFERENTIAL/PLATELET
BASOS ABS: 0 10*3/uL (ref 0.0–0.1)
Basophils Relative: 0 %
EOS ABS: 0.2 10*3/uL (ref 0.0–0.7)
EOS PCT: 2 %
HCT: 39.4 % (ref 39.0–52.0)
Hemoglobin: 13.6 g/dL (ref 13.0–17.0)
LYMPHS ABS: 1 10*3/uL (ref 0.7–4.0)
Lymphocytes Relative: 9 %
MCH: 32.5 pg (ref 26.0–34.0)
MCHC: 34.5 g/dL (ref 30.0–36.0)
MCV: 94.3 fL (ref 78.0–100.0)
Monocytes Absolute: 0.4 10*3/uL (ref 0.1–1.0)
Monocytes Relative: 4 %
Neutro Abs: 9.4 10*3/uL — ABNORMAL HIGH (ref 1.7–7.7)
Neutrophils Relative %: 85 %
PLATELETS: 212 10*3/uL (ref 150–400)
RBC: 4.18 MIL/uL — AB (ref 4.22–5.81)
RDW: 12.5 % (ref 11.5–15.5)
WBC: 11.1 10*3/uL — AB (ref 4.0–10.5)

## 2015-01-26 LAB — I-STAT TROPONIN, ED: Troponin i, poc: 0 ng/mL (ref 0.00–0.08)

## 2015-01-26 LAB — PROTIME-INR
INR: 3 — ABNORMAL HIGH (ref 0.00–1.49)
PROTHROMBIN TIME: 30.6 s — AB (ref 11.6–15.2)

## 2015-01-26 LAB — CBG MONITORING, ED: GLUCOSE-CAPILLARY: 110 mg/dL — AB (ref 65–99)

## 2015-01-26 LAB — INFLUENZA PANEL BY PCR (TYPE A & B)
H1N1 flu by pcr: NOT DETECTED
Influenza A By PCR: NEGATIVE
Influenza B By PCR: NEGATIVE

## 2015-01-26 LAB — STREP PNEUMONIAE URINARY ANTIGEN: Strep Pneumo Urinary Antigen: NEGATIVE

## 2015-01-26 MED ORDER — ALBUTEROL SULFATE (2.5 MG/3ML) 0.083% IN NEBU
2.5000 mg | INHALATION_SOLUTION | RESPIRATORY_TRACT | Status: DC | PRN
Start: 2015-01-26 — End: 2015-01-27
  Administered 2015-01-27: 2.5 mg via RESPIRATORY_TRACT
  Filled 2015-01-26: qty 3

## 2015-01-26 MED ORDER — BUSPIRONE HCL 15 MG PO TABS
15.0000 mg | ORAL_TABLET | Freq: Two times a day (BID) | ORAL | Status: DC
  Administered 2015-01-26 – 2015-01-27 (×2): 15 mg via ORAL
  Filled 2015-01-26 (×3): qty 1

## 2015-01-26 MED ORDER — MESALAMINE ER 250 MG PO CPCR
500.0000 mg | ORAL_CAPSULE | Freq: Two times a day (BID) | ORAL | Status: DC
  Administered 2015-01-26 – 2015-01-27 (×2): 500 mg via ORAL
  Filled 2015-01-26 (×3): qty 2

## 2015-01-26 MED ORDER — ONDANSETRON HCL 4 MG/2ML IJ SOLN: 4.0000 mg | Freq: Four times a day (QID) | INTRAMUSCULAR | Status: DC | PRN

## 2015-01-26 MED ORDER — ENSURE ENLIVE PO LIQD: 237.0000 mL | Freq: Two times a day (BID) | ORAL | Status: DC

## 2015-01-26 MED ORDER — ONDANSETRON HCL 4 MG PO TABS: 4.0000 mg | ORAL_TABLET | Freq: Four times a day (QID) | ORAL | Status: DC | PRN

## 2015-01-26 MED ORDER — WARFARIN SODIUM 2.5 MG PO TABS
3.5000 mg | ORAL_TABLET | Freq: Once | ORAL | Status: AC
  Administered 2015-01-26: 3.5 mg via ORAL
  Filled 2015-01-26 (×3): qty 1

## 2015-01-26 MED ORDER — CALCIUM CITRATE 950 (200 CA) MG PO TABS
200.0000 mg | ORAL_TABLET | Freq: Every day | ORAL | Status: DC
  Administered 2015-01-27: 200 mg via ORAL
  Filled 2015-01-26 (×3): qty 1

## 2015-01-26 MED ORDER — POTASSIUM CHLORIDE CRYS ER 20 MEQ PO TBCR
40.0000 meq | EXTENDED_RELEASE_TABLET | Freq: Once | ORAL | Status: AC
  Administered 2015-01-26: 40 meq via ORAL
  Filled 2015-01-26: qty 2

## 2015-01-26 MED ORDER — VITAMIN D (ERGOCALCIFEROL) 1.25 MG (50000 UNIT) PO CAPS: 50000.0000 [IU] | ORAL_CAPSULE | ORAL | Status: DC

## 2015-01-26 MED ORDER — CLONAZEPAM 0.5 MG PO TABS: 0.2500 mg | ORAL_TABLET | Freq: Two times a day (BID) | ORAL | Status: DC | PRN

## 2015-01-26 MED ORDER — GEMFIBROZIL 600 MG PO TABS
600.0000 mg | ORAL_TABLET | Freq: Two times a day (BID) | ORAL | Status: DC
  Administered 2015-01-26 – 2015-01-27 (×2): 600 mg via ORAL
  Filled 2015-01-26 (×3): qty 1

## 2015-01-26 MED ORDER — GUAIFENESIN-DM 100-10 MG/5ML PO SYRP
5.0000 mL | ORAL_SOLUTION | ORAL | Status: DC | PRN
  Administered 2015-01-27: 5 mL via ORAL
  Filled 2015-01-26: qty 5

## 2015-01-26 MED ORDER — AMLODIPINE BESYLATE 5 MG PO TABS
5.0000 mg | ORAL_TABLET | Freq: Every day | ORAL | Status: DC
  Administered 2015-01-26: 5 mg via ORAL
  Filled 2015-01-26 (×2): qty 1

## 2015-01-26 MED ORDER — NEBIVOLOL HCL 5 MG PO TABS
5.0000 mg | ORAL_TABLET | Freq: Every day | ORAL | Status: DC
  Administered 2015-01-27: 5 mg via ORAL
  Filled 2015-01-26: qty 1

## 2015-01-26 MED ORDER — POTASSIUM CHLORIDE IN NACL 20-0.9 MEQ/L-% IV SOLN
INTRAVENOUS | Status: AC
  Administered 2015-01-26: 16:00:00 via INTRAVENOUS
  Filled 2015-01-26: qty 1000

## 2015-01-26 MED ORDER — PIRFENIDONE 267 MG PO CAPS
3.0000 | ORAL_CAPSULE | Freq: Three times a day (TID) | ORAL | Status: DC
  Administered 2015-01-27 (×2): 3 via ORAL
  Filled 2015-01-26: qty 1

## 2015-01-26 MED ORDER — DEXTROSE 5 % IV SOLN
500.0000 mg | INTRAVENOUS | Status: DC
  Administered 2015-01-26 – 2015-01-27 (×2): 500 mg via INTRAVENOUS
  Filled 2015-01-26 (×2): qty 500

## 2015-01-26 MED ORDER — ENOXAPARIN SODIUM 40 MG/0.4ML ~~LOC~~ SOLN: 40.0000 mg | SUBCUTANEOUS | Status: DC

## 2015-01-26 MED ORDER — LEVOFLOXACIN IN D5W 750 MG/150ML IV SOLN
750.0000 mg | INTRAVENOUS | Status: DC
Start: 2015-01-26 — End: 2015-01-26

## 2015-01-26 MED ORDER — DEXTROSE 5 % IV SOLN
1.0000 g | INTRAVENOUS | Status: DC
  Administered 2015-01-26 – 2015-01-27 (×2): 1 g via INTRAVENOUS
  Filled 2015-01-26 (×2): qty 10

## 2015-01-26 MED ORDER — HYDROCODONE-ACETAMINOPHEN 5-325 MG PO TABS: 1.0000 | ORAL_TABLET | ORAL | Status: DC | PRN

## 2015-01-26 MED ORDER — ALUM & MAG HYDROXIDE-SIMETH 200-200-20 MG/5ML PO SUSP: 30.0000 mL | Freq: Four times a day (QID) | ORAL | Status: DC | PRN

## 2015-01-26 MED ORDER — WARFARIN - PHARMACIST DOSING INPATIENT: Freq: Every day | Status: DC

## 2015-01-26 MED ORDER — SODIUM CHLORIDE 0.9 % IJ SOLN
3.0000 mL | Freq: Two times a day (BID) | INTRAMUSCULAR | Status: DC
Start: 2015-01-26 — End: 2015-01-27

## 2015-01-26 NOTE — ED Provider Notes (Signed)
CSN: [id number]     Arrival date & time 01/26/15  0920 History   First MD Initiated Contact with Patient 01/26/15 [phone]     Chief Complaint  Patient presents with  . Shortness of Breath     (Consider location/radiation/quality/duration/timing/severity/associated sxs/prior Treatment) Patient is a 81 y.o. male presenting with shortness of breath. The history is provided by the patient and the spouse.  Shortness of Breath Severity:  Moderate Onset quality:  Gradual Duration:  1 week Timing:  Constant Progression:  Worsening Chronicity:  Chronic Context: URI   Relieved by:  Nothing Worsened by:  Nothing tried Ineffective treatments:  None tried Associated symptoms: cough   Associated symptoms: no chest pain and no fever    Patient is an 81yo male with PMH that includes afib and pulmonary fibrosis who presents with worsening shortness of breath and hypoxia for the past week. Patient reports that he intermittently uses 2L of oxygen at home, however over the past week, has had to use oxygen more often after his daily walks/exercise. Patient also reports that he noticed desaturations in the 80s earlier this week while off oxygen. His oxygen saturation level returned to normal after going back on oxygen. Wife reports that the patient had a desaturation this morning into the 70s and subsequently brought him to the ED. Wife also reports URI symptoms over the bast few days including rhinorrhea and cough. Patient denies chest pain, fevers, chills, diaphoresis, abdominal pain, nausea, and vomiting.   Past Medical History  Diagnosis Date  . Muscle tremor     saw neurology remotely elsewhere, was Rx inderal  . Crohn's ileocolitis ([place])   . Osteopenia     per DEXA 11/2008  . Hypertension 11/11    mild  . Hemorrhoids   . Hypertrophy of prostate     w/o UR obst & oth luts  . Peripheral neuropathy (HCC)     h/o neg w/u  . Osteoarthritis     ankle,right  . ED (erectile dysfunction)   .  Hypertriglyceridemia   . B12 deficiency   . COPD (chronic obstructive pulmonary disease) ([place])     recent dx--no acute problems  . Skin cancer     squamous cell skin cancers-tx'd q27yrs  . Hiatal hernia 1956    found while in service  . Anxiety   . Atrial fibrillation with RVR ([place])   . Long term (current) use of anticoagulants 02/21/2011  . Serrated adenoma of colon 03/2010  . Renal cyst     bilateral  . Pulmonary fibrosis ([place]) 2015    Rx Esbriet ~ 10-2013 ([name]), Dr. [name]  . On home oxygen therapy     Currently at 2 LPM  . Idiopathic pulmonary fibrosis ([place]) 2016    Dr. [name]  . Emphysema lung ([place]) 2016    Dr. [name]   Past Surgical History  Procedure Laterality Date  . Resection of distal ileum and cecum w/ appendectomy      18-inch small intestine, 1984 for Crohn's disease  . Colon surgery  1983    has chrohn's  . Appendectomy  1983  . Cholecystectomy  02/17/2011    Procedure: LAPAROSCOPIC CHOLECYSTECTOMY WITH INTRAOPERATIVE CHOLANGIOGRAM;  Surgeon: Edward Jolly, MD;  Location: [facility];  Service: General;  Laterality: N/A;  . Cataract extraction      bilateral ~ 2007   Family History  Problem Relation Age of Onset  . Crohn's disease Brother   . Breast cancer Mother   . Colon  cancer Neg Hx   . Prostate cancer Neg Hx   . Heart attack Father 53  . Dementia Sister   . Diabetes Sister   . Stroke Mother 72   Social History  Substance Use Topics  . Smoking status: Former Smoker -- 1.00 packs/day for 40 years    Types: Cigarettes, Pipe    Quit date: 03/24/1990  . Smokeless tobacco: Never Used  . Alcohol Use: No     Comment:      Review of Systems  Constitutional: Negative for fever.  HENT: Positive for rhinorrhea.   Eyes: Negative.   Respiratory: Positive for cough and shortness of breath.   Cardiovascular: Negative for chest pain.  Gastrointestinal: Negative.   Endocrine: Negative.   Genitourinary: Negative.   Musculoskeletal: Negative.    Skin: Negative.   Allergic/Immunologic: Negative.   Neurological: Negative.   Hematological: Negative.   Psychiatric/Behavioral: Negative.       Allergies  Review of patient's allergies indicates no known allergies.  Home Medications   Prior to Admission medications   Medication Sig Start Date End Date Taking? Authorizing Provider  acetaminophen (TYLENOL) 325 MG tablet Take 650 mg by mouth as needed for pain.   Yes Historical Provider, MD  amLODipine (NORVASC) 5 MG tablet Take 1 tablet (5 mg total) by mouth daily. 07/25/14  Yes Colon Branch, MD  busPIRone (BUSPAR) 15 MG tablet Take 1 tablet (15 mg total) by mouth 2 (two) times daily. 11/22/14  Yes Colon Branch, MD  calcium citrate (CALCITRATE - DOSED IN MG ELEMENTAL CALCIUM) 950 MG tablet Take 200 mg of elemental calcium by mouth daily. Pt take QOD   Yes Historical Provider, MD  clonazePAM (KLONOPIN) 0.5 MG tablet Take 0.5 tablets (0.25 mg total) by mouth 3 (three) times daily as needed for anxiety. Patient taking differently: Take 0.125 mg by mouth 3 (three) times daily as needed for anxiety.  01/24/15  Yes Colon Branch, MD  Cyanocobalamin (NASCOBAL) 500 MCG/0.1ML SOLN Use one spray nasally per week as directed. Patient taking differently: Place 0.1 mLs into the nose every 7 (seven) days. Use one spray nasally per week as directed. 12/14/14  Yes Colon Branch, MD  gemfibrozil (LOPID) 600 MG tablet Take 1 tablet (600 mg total) by mouth 2 (two) times daily. 07/25/14  Yes Colon Branch, MD  mesalamine (PENTASA) 250 MG CR capsule Take 2 capsules (500 mg total) by mouth 2 (two) times daily. 12/12/14  Yes Ladene Artist, MD  nebivolol (BYSTOLIC) 5 MG tablet Take 1 tablet (5 mg total) by mouth daily. 12/26/14  Yes Larey Dresser, MD  Pirfenidone 267 MG CAPS Take 3 capsules by mouth 3 (three) times daily.   Yes Historical Provider, MD  Vitamin D, Ergocalciferol, (DRISDOL) 50000 UNITS CAPS capsule Take 50,000 Units by mouth every 7 (seven) days.   Yes Historical  Provider, MD  warfarin (COUMADIN) 2 MG tablet Take 1 tablet (2 mg total) by mouth as directed. Patient taking differently: Take 3-4 mg by mouth daily. Take 2 tablets on Monday, Wednesday, & Friday Take 1.5 tablets on all other days 10/04/14  Yes Larey Dresser, MD  OXYGEN Inhale into the lungs. 2 liters    Historical Provider, MD  psyllium (METAMUCIL SMOOTH TEXTURE) 28 % packet Take 1 packet by mouth daily as needed. For constipation    Historical Provider, MD   BP 116/55 mmHg  Pulse 85  Temp(Src) 97.7 F (36.5 C)  Resp 24  Ht  5' 11.75" (1.822 m)  Wt 142 lb (64.411 kg)  BMI 19.40 kg/m2  SpO2 96% Physical Exam  Constitutional: He appears well-developed and well-nourished. Nasal cannula in place.  HENT:  Head: Normocephalic and atraumatic.  Eyes: EOM are normal. Pupils are equal, round, and reactive to light.  Neck: Normal range of motion. Neck supple.  Cardiovascular: An irregularly irregular rhythm present.  No murmur heard. Pulmonary/Chest: He has no decreased breath sounds. He has no wheezes. He has no rhonchi. He has rales in the right lower field and the left lower field.  Neurological: He is alert.  Nursing note and vitals reviewed.   ED Course  Procedures (including critical care time) Labs Review Labs Reviewed  BASIC METABOLIC PANEL - Abnormal; Notable for the following:    Potassium 3.2 (*)    Glucose, Bld 143 (*)    All other components within normal limits  CBC WITH DIFFERENTIAL/PLATELET - Abnormal; Notable for the following:    WBC 11.1 (*)    RBC 4.18 (*)    Neutro Abs 9.4 (*)    All other components within normal limits  PROTIME-INR - Abnormal; Notable for the following:    Prothrombin Time 30.6 (*)    INR 3.00 (*)    All other components within normal limits  CULTURE, EXPECTORATED SPUTUM-ASSESSMENT  CULTURE, BLOOD (ROUTINE X 2)  CULTURE, BLOOD (ROUTINE X 2)  CULTURE, BLOOD (ROUTINE X 2)  CULTURE, BLOOD (ROUTINE X 2)  CULTURE, EXPECTORATED  SPUTUM-ASSESSMENT  I-STAT TROPOININ, ED    Imaging Review Dg Chest 2 View  01/26/2015  CLINICAL DATA:  Acute shortness of breath for 1 week. History of pulmonary fibrosis and COPD EXAM: CHEST  2 VIEW COMPARISON:  06/07/2013 FINDINGS: New patchy right upper lobe airspace opacity abutting the fissure compatible with superimposed pneumonia. Background COPD/ emphysema evident with chronic basilar pulmonary fibrosis. Stable left midline calcified granuloma. Chronic apical scarring evident. Stable heart size and vascularity. No associated effusion or edema pattern. Trachea is midline. No acute osseous finding. IMPRESSION: Patchy right upper lobe pneumonia superimposed on chronic COPD and basilar fibrosis Remote granulomatous disease Recommend short-term radiographic follow-up after medical therapy to document resolution. Electronically Signed   By: Jerilynn Mages.  Shick M.D.   On: 01/26/2015 10:33   I have personally reviewed and evaluated these images and lab results as part of my medical decision-making.   EKG Interpretation None      MDM   Final diagnoses:  Hypoxia  CAP (community acquired pneumonia)  Pulmonary fibrosis ([place])   10:15 AM Patient is an 81yo man with history of afib and IPF presenting with gradually worsening shortness of breath for the past week. Concern for acute exacerbation of pulmonary fibrosis. Will check CXR. EKG without signs of ischemia, will check istat troponin. Doubt PE given chronic anticoagulation, will check INR. Afib currently rate controlled (ventricular rate 80s-90s).    10:54 AM CXR consistent with CAP. Also with mild leukocytosis. Will obtain blood cultures, start antibiotics, and admit to [facility].   Vivi Barrack, MD 01/26/15 1118  Elnora Morrison, MD 01/26/15 [phone]

## 2015-01-26 NOTE — ED Notes (Signed)
POCT CBG resulted 110; Elmyra Ricks, RN present in room

## 2015-01-26 NOTE — ED Notes (Signed)
Patient placed back on monitor, continuous pulse oximetry, blood pressure cuff and oxygen [place] (2L); visitor at bedside

## 2015-01-26 NOTE — ED Notes (Addendum)
Pt. Developed sob 1 week ago, slowly.  He has a hx of Pulmonary Fibrosis,  And does where oxygen.  Pt. Reports that he has had to use oxygen 2 liters.  When he exercises he uses 4l. Pt. Reports that he has developed a cough and congestion  Pts. Brother passed away recently and he began on .25mg  of Ativan.  Pt. Is alert and oriented X4.  Skin is p.w.d

## 2015-01-26 NOTE — ED Notes (Signed)
Patient resting with visitor at bedside

## 2015-01-26 NOTE — Progress Notes (Addendum)
Pt ambulated 550 ft with 4LO2. O2 saturation WNL. Patient tolerated well with no signs of distress. Will continue to monitor.   Raliegh Ip RN

## 2015-01-26 NOTE — ED Notes (Signed)
Patient up ambulatory to the bathroom at this time with no difficulty or distress

## 2015-01-26 NOTE — H&P (Signed)
Patient Demographics:    Brian [name], is a 81 y.o. male  MRN: [id number]   DOB - [birth year]  Admit Date - 01/26/2015  Outpatient Primary MD for the patient is [name], MD   With History of -  Past Medical History  Diagnosis Date  . Muscle tremor     saw neurology remotely elsewhere, was Rx inderal  . Crohn's ileocolitis ([place])   . Osteopenia     per DEXA 11/2008  . Hypertension 11/11    mild  . Hemorrhoids   . Hypertrophy of prostate     w/o UR obst & oth luts  . Peripheral neuropathy (HCC)     h/o neg w/u  . Osteoarthritis     ankle,right  . ED (erectile dysfunction)   . Hypertriglyceridemia   . B12 deficiency   . COPD (chronic obstructive pulmonary disease) ([place])     recent dx--no acute problems  . Skin cancer     squamous cell skin cancers-tx'd q72yrs  . Hiatal hernia 1956    found while in service  . Anxiety   . Atrial fibrillation with RVR ([place])   . Long term (current) use of anticoagulants 02/21/2011  . Serrated adenoma of colon 03/2010  . Renal cyst     bilateral  . Pulmonary fibrosis ([place]) 2015    Rx Esbriet ~ 10-2013 ([name]), Dr. [name]  . On home oxygen therapy     Currently at 2 LPM  . Idiopathic pulmonary fibrosis ([place]) 2016    Dr. [name]  . Emphysema lung ([place]) 2016    Dr. [name]      Past Surgical History  Procedure Laterality Date  . Resection of distal ileum and cecum w/ appendectomy      18-inch small intestine, 1984 for Crohn's disease  . Colon surgery  1983    has chrohn's  . Appendectomy  1983  . Cholecystectomy  02/17/2011    Procedure: LAPAROSCOPIC CHOLECYSTECTOMY WITH INTRAOPERATIVE CHOLANGIOGRAM;  Surgeon: Edward Jolly, MD;  Location: [facility];  Service: General;  Laterality: N/A;  . Cataract extraction      bilateral ~ 2007    in for   Chief  Complaint  Patient presents with  . Shortness of Breath      HPI:    Brian [name]  is a 81 y.o. male, with history of pulmonary fibrosis who follows with [name], is on 2 L nasal cannula oxygen chronically, history of Crohn's disease in good control for several years, remote history of smoking quit close to 20 years ago, essential hypertension, chronic atrial fibrillation on Coumadin, BPH, B-12 deficiency who comes to the hospital after experiencing 3-4 day history of gradually progressive shortness of breath and a dry cough. He is also experiencing generalized weakness and lack of appetite. Denies any fever or chills, denies any exposure to sick contacts or recent travel, no history of DVT PE or swelling  in legs, no orthopnea or PND, denies any subjective fevers.  In the ER chest x-ray suggestive of right upper lobe infiltrate, mild leukocytosis, mild acute on chronic hypoxic respiratory failure, was diagnosed with community-acquired pneumonia and I was requested to the patient.    Review of systems:    In addition to the HPI above,   No Fever-chills, No Headache, No changes with Vision or hearing, No problems swallowing food or Liquids, No Chest pain, positive dry cough and exertional shortness of breath, No Abdominal pain, No Nausea or Vommitting, Bowel movements are regular, No Blood in stool or Urine, No dysuria, No new skin rashes or bruises, No new joints pains-aches,  No new weakness, tingling, numbness in any extremity, No recent weight gain or loss, No polyuria, polydypsia or polyphagia, No significant Mental Stressors.  A full 10 point Review of Systems was done, except as stated above, all other Review of Systems were negative.    Social History:     Social History  Substance Use Topics  . Smoking status: Former Smoker -- 1.00 packs/day for 40 years    Types: Cigarettes, Pipe    Quit date: 03/24/1990  . Smokeless tobacco: Never Used  . Alcohol Use: No       Comment:      Lives - at home with his wife, ambulates byself uses home oxygen      Family History :     Family History  Problem Relation Age of Onset  . Crohn's disease Brother   . Breast cancer Mother   . Colon cancer Neg Hx   . Prostate cancer Neg Hx   . Heart attack Father 26  . Dementia Sister   . Diabetes Sister   . Stroke Mother 69       Home Medications:   Prior to Admission medications   Medication Sig Start Date End Date Taking? Authorizing Provider  acetaminophen (TYLENOL) 325 MG tablet Take 650 mg by mouth as needed for pain.   Yes Historical Provider, MD  amLODipine (NORVASC) 5 MG tablet Take 1 tablet (5 mg total) by mouth daily. 07/25/14  Yes Colon Branch, MD  busPIRone (BUSPAR) 15 MG tablet Take 1 tablet (15 mg total) by mouth 2 (two) times daily. 11/22/14  Yes Colon Branch, MD  calcium citrate (CALCITRATE - DOSED IN MG ELEMENTAL CALCIUM) 950 MG tablet Take 200 mg of elemental calcium by mouth daily. Pt take QOD   Yes Historical Provider, MD  clonazePAM (KLONOPIN) 0.5 MG tablet Take 0.5 tablets (0.25 mg total) by mouth 3 (three) times daily as needed for anxiety. Patient taking differently: Take 0.125 mg by mouth 3 (three) times daily as needed for anxiety.  01/24/15  Yes Colon Branch, MD  Cyanocobalamin (NASCOBAL) 500 MCG/0.1ML SOLN Use one spray nasally per week as directed. Patient taking differently: Place 0.1 mLs into the nose every 7 (seven) days. Use one spray nasally per week as directed. 12/14/14  Yes Colon Branch, MD  gemfibrozil (LOPID) 600 MG tablet Take 1 tablet (600 mg total) by mouth 2 (two) times daily. 07/25/14  Yes Colon Branch, MD  mesalamine (PENTASA) 250 MG CR capsule Take 2 capsules (500 mg total) by mouth 2 (two) times daily. 12/12/14  Yes Ladene Artist, MD  nebivolol (BYSTOLIC) 5 MG tablet Take 1 tablet (5 mg total) by mouth daily. 12/26/14  Yes Larey Dresser, MD  Pirfenidone 267 MG CAPS Take 3 capsules by mouth 3 (three) times  daily.   Yes  Historical Provider, MD  Vitamin D, Ergocalciferol, (DRISDOL) 50000 UNITS CAPS capsule Take 50,000 Units by mouth every 7 (seven) days.   Yes Historical Provider, MD  warfarin (COUMADIN) 2 MG tablet Take 1 tablet (2 mg total) by mouth as directed. Patient taking differently: Take 3-4 mg by mouth daily. Take 2 tablets on Monday, Wednesday, & Friday Take 1.5 tablets on all other days 10/04/14  Yes Larey Dresser, MD  OXYGEN Inhale into the lungs. 2 liters    Historical Provider, MD  psyllium (METAMUCIL SMOOTH TEXTURE) 28 % packet Take 1 packet by mouth daily as needed. For constipation    Historical Provider, MD     Allergies:    No Known Allergies   Physical Exam:   Vitals  Blood pressure 107/65, pulse 78, temperature 97.7 F (36.5 C), resp. rate 20, height 5' 11.75" (1.822 m), weight 64.411 kg (142 lb), SpO2 95 %.   1. General thin elderly white male lying in bed in NAD,     2. Normal affect and insight, Not Suicidal or Homicidal, Awake Alert, Oriented X 3.  3. No F.N deficits, ALL C.Nerves Intact, Strength 5/5 all 4 extremities, Sensation intact all 4 extremities, Plantars down going.  4. Ears and Eyes appear Normal, Conjunctivae clear, PERRLA. Moist Oral Mucosa.  5. Supple Neck, No JVD, No cervical lymphadenopathy appriciated, No Carotid Bruits.  6. Symmetrical Chest wall movement, Good air movement bilaterally, few bibasilar rales, some right upper lobe rales  7. iRRR, No Gallops, Rubs or Murmurs, No Parasternal Heave.  8. Positive Bowel Sounds, Abdomen Soft, No tenderness, No organomegaly appriciated,No rebound -guarding or rigidity.  9.  No Cyanosis, Normal Skin Turgor, No Skin Rash or Bruise.  10. Good muscle tone,  joints appear normal , no effusions, Normal ROM.  11. No Palpable Lymph Nodes in Neck or Axillae      Data Review:    CBC  Recent Labs Lab 01/26/15 0950  WBC 11.1*  HGB 13.6  HCT 39.4  PLT 212  MCV 94.3  MCH 32.5  MCHC 34.5  RDW 12.5   LYMPHSABS 1.0  MONOABS 0.4  EOSABS 0.2  BASOSABS 0.0   ------------------------------------------------------------------------------------------------------------------  Chemistries   Recent Labs Lab 01/26/15 0950  NA 137  K 3.2*  CL 105  CO2 22  GLUCOSE 143*  BUN 17  CREATININE 0.99  CALCIUM 9.0   ------------------------------------------------------------------------------------------------------------------ estimated creatinine clearance is 50.6 mL/min (by C-G formula based on Cr of 0.99). ------------------------------------------------------------------------------------------------------------------ No results for input(s): TSH, T4TOTAL, T3FREE, THYROIDAB in the last 72 hours.  Invalid input(s): FREET3   Coagulation profile  Recent Labs Lab 01/26/15 0950  INR 3.00*   ------------------------------------------------------------------------------------------------------------------- No results for input(s): DDIMER in the last 72 hours. -------------------------------------------------------------------------------------------------------------------  Cardiac Enzymes No results for input(s): CKMB, TROPONINI, MYOGLOBIN in the last 168 hours.  Invalid input(s): CK ------------------------------------------------------------------------------------------------------------------ Invalid input(s): POCBNP   ---------------------------------------------------------------------------------------------------------------  Urinalysis    Component Value Date/Time   COLORURINE YELLOW 02/14/2011 2038   APPEARANCEUR CLEAR 02/14/2011 2038   LABSPEC 1.020 02/14/2011 2038   PHURINE 5.5 02/14/2011 2038   GLUCOSEU NEGATIVE 02/14/2011 2038   HGBUR TRACE* 02/14/2011 2038   HGBUR trace-lysed 09/16/2006 0821   BILIRUBINUR negative 06/10/2013 1049   BILIRUBINUR NEGATIVE 02/14/2011 2038   KETONESUR NEGATIVE 02/14/2011 2038   PROTEINUR negative 06/10/2013 1049   PROTEINUR  NEGATIVE 02/14/2011 2038   UROBILINOGEN 0.2 06/10/2013 1049   UROBILINOGEN 0.2 02/14/2011 2038   NITRITE negative 06/10/2013 1049  NITRITE NEGATIVE 02/14/2011 2038   LEUKOCYTESUR Negative 06/10/2013 1049    ----------------------------------------------------------------------------------------------------------------   Imaging Results:    Dg Chest 2 View  01/26/2015  CLINICAL DATA:  Acute shortness of breath for 1 week. History of pulmonary fibrosis and COPD EXAM: CHEST  2 VIEW COMPARISON:  06/07/2013 FINDINGS: New patchy right upper lobe airspace opacity abutting the fissure compatible with superimposed pneumonia. Background COPD/ emphysema evident with chronic basilar pulmonary fibrosis. Stable left midline calcified granuloma. Chronic apical scarring evident. Stable heart size and vascularity. No associated effusion or edema pattern. Trachea is midline. No acute osseous finding. IMPRESSION: Patchy right upper lobe pneumonia superimposed on chronic COPD and basilar fibrosis Remote granulomatous disease Recommend short-term radiographic follow-up after medical therapy to document resolution. Electronically Signed   By: Jerilynn Mages.  Shick M.D.   On: 01/26/2015 10:33    My personal review of EKG: Rhythm atrial fibrillation with 96 bpm, no Acute ST changes   Assessment & Plan:     1. Community-acquired pneumonia causing acute on chronic hypoxic respiratory failure. Will be admitted to a telemetry bed, sputum and blood cultures, empiric IV antibiotics, gentle IV fluids, doubt he has aspirated but will request speech therapy to evaluate one time, oxygen and nebulizer treatments as needed. Likely home in 1-2 days.   2. History of pulmonary fibrosis on 2 L nasal cannula oxygen. Follow with [name] after discharge.   3. History of Crohn's disease. Stable. Continue Pentasa and B-12 supplementation.   4. Chronic atrial fibrillation. Chads were asked to score is 2 or more. Goal will be rate  control, continue home dose better blocker, pharmacy to dose Coumadin.   5. Essential hypertension. Continue home medications unchanged.   6. Hypokalemia. Replaced.    DVT Prophylaxis Coumadin  AM Labs Ordered, also please review Full Orders  Family Communication: Admission, patients condition and plan of care including tests being ordered have been discussed with the patient and wife who indicate understanding and agree with the plan and Code Status.  Code Status DO NOT RESUSCITATE  Likely DC to  home in 1-2 days  Condition GUARDED    Time spent in minutes :35    Elice Crigger K M.D on 01/26/2015 at 12:01 PM  Between 7am to 7pm - Pager - [phone]  After 7pm go to [email or web address] - password [facility]  [facility] - Office  [phone]

## 2015-01-26 NOTE — Evaluation (Signed)
Clinical/Bedside Swallow Evaluation Patient Details  Name: Brian [name] MRN: [id number] Date of Birth: Mar 24, 1929  Today's Date: 01/26/2015 Time: SLP Start Time (ACUTE ONLY): 1450 SLP Stop Time (ACUTE ONLY): 1506 SLP Time Calculation (min) (ACUTE ONLY): 16 min  Past Medical History:  Past Medical History  Diagnosis Date  . Muscle tremor     saw neurology remotely elsewhere, was Rx inderal  . Crohn's ileocolitis ([place])   . Osteopenia     per DEXA 11/2008  . Hypertension 11/11    mild  . Hemorrhoids   . Hypertrophy of prostate     w/o UR obst & oth luts  . Peripheral neuropathy (HCC)     h/o neg w/u  . Osteoarthritis     ankle,right  . ED (erectile dysfunction)   . Hypertriglyceridemia   . B12 deficiency   . COPD (chronic obstructive pulmonary disease) ([place])     recent dx--no acute problems  . Skin cancer     squamous cell skin cancers-tx'd q45yrs  . Hiatal hernia 1956    found while in service  . Anxiety   . Atrial fibrillation with RVR ([place])   . Long term (current) use of anticoagulants 02/21/2011  . Serrated adenoma of colon 03/2010  . Renal cyst     bilateral  . Pulmonary fibrosis ([place]) 2015    Rx Esbriet ~ 10-2013 ([name]), Dr. [name]  . On home oxygen therapy     Currently at 2 LPM  . Idiopathic pulmonary fibrosis ([place]) 2016    Dr. [name]  . Emphysema lung ([place]) 2016    Dr. [name]   Past Surgical History:  Past Surgical History  Procedure Laterality Date  . Resection of distal ileum and cecum w/ appendectomy      18-inch small intestine, 1984 for Crohn's disease  . Colon surgery  1983    has chrohn's  . Appendectomy  1983  . Cholecystectomy  02/17/2011    Procedure: LAPAROSCOPIC CHOLECYSTECTOMY WITH INTRAOPERATIVE CHOLANGIOGRAM;  Surgeon: Edward Jolly, MD;  Location: [facility];  Service: General;  Laterality: N/A;  . Cataract extraction      bilateral ~ 2007   HPI:  81 y.o. male, with history of pulmonary fibrosis who follows with [name], is on 2 L nasal cannula oxygen chronically, history of Crohn's disease in good control for several years, remote history of smoking quit close to 20 years ago, essential hypertension, chronic atrial fibrillation on Coumadin, BPH, B-12 deficiency who comes to the hospital after experiencing 3-4 day history of gradually progressive shortness of breath and a dry cough. He is also experiencing generalized weakness and lack of appetite.  CXR right upper lobe infiltrate.  Dx CAP - SLP swallow eval requested by MD.    Assessment / Plan / Recommendation Clinical Impression  Pt presents with normal oropharyngeal swallow with active mastication, brisk swallow response, no overt s/s of aspiration.  There is adequate coordination of swallow and ventilation.  No SLP f/u needed.  Continue regular diet with thin liquids.     Aspiration Risk       Diet Recommendation Age appropriate regular solids;Thin   Medication Administration: Whole meds with liquid    Other  Recommendations Oral Care Recommendations: Oral care BID       Swallow Study Prior Functional Status       General Date of Onset: 01/26/15 Other Pertinent Information: 81 y.o. male, with history of pulmonary fibrosis who follows with [name], is on  2 L nasal cannula oxygen chronically, history of Crohn's disease in good control for several years, remote history of smoking quit close to 20 years ago, essential hypertension, chronic atrial fibrillation on Coumadin, BPH, B-12 deficiency who comes to the hospital after experiencing 3-4 day history of gradually progressive shortness of breath and a dry cough. He is also experiencing generalized weakness and lack of appetite.  CXR right upper lobe infiltrate.  Dx CAP - SLP swallow eval requested by MD.  Type of Study: Bedside swallow evaluation Previous Swallow Assessment: no Diet Prior to this Study: Regular;Thin liquids Temperature Spikes Noted: No Respiratory Status: Supplemental  O2 delivered via (comment) History of Recent Intubation: No Behavior/Cognition: Alert;Cooperative;Pleasant mood Oral Cavity - Dentition: Edentulous (wearing upper and lower dentures) Self-Feeding Abilities: Able to feed self Patient Positioning: Upright in bed Baseline Vocal Quality: Normal Volitional Cough: Strong Volitional Swallow: Able to elicit    Oral/Motor/Sensory Function Overall Oral Motor/Sensory Function: Appears within functional limits for tasks assessed   Ice Chips Ice chips: Within functional limits Presentation: Self Fed;Spoon   Thin Liquid Thin Liquid: Within functional limits Presentation: Cup;Self Fed    Nectar Thick Nectar Thick Liquid: Not tested   Honey Thick Honey Thick Liquid: Not tested   Puree Puree: Within functional limits Presentation: Self Fed;Spoon   Solid   GO    Solid: Within functional limits Presentation: [place]. [name], [place] CCC/SLP Pager [phone]  [name] 01/26/2015,3:09 PM

## 2015-01-26 NOTE — Progress Notes (Signed)
ANTICOAGULATION CONSULT NOTE - Initial Consult  Pharmacy Consult for warfarin Indication: atrial fibrillation  No Known Allergies  Patient Measurements: Height: 5' 11.75" (182.2 cm) Weight: 142 lb (64.411 kg) IBW/kg (Calculated) : 77.03  Vital Signs: Temp: 97.7 F (36.5 C) (11/04 0938) BP: 108/59 mmHg (11/04 1214) Pulse Rate: 71 (11/04 1214)  Labs:  Recent Labs  01/26/15 0950  HGB 13.6  HCT 39.4  PLT 212  LABPROT 30.6*  INR 3.00*  CREATININE 0.99     Assessment: 88 yoM with hx of pulmonary fibrosis on 02 at home presents with worsening hypoxia/SOB over last few days. Has chronic afib and was on warfarin PTA.  PTA dose: 4 mg MWF, 3.5 mg AOD 11/4 INR 3 CBC: WNL no reported bleeding    Goal of Therapy:  INR 2-3 Monitor platelets by anticoagulation protocol: Yes   Plan:  -Will give warfarin 3.5 mg x 1 tonight as he is the higher end of desired INR range -Daily INR -Monitor for sxs of bleeding   Vincenza Hews, PharmD, BCPS 01/26/2015, 12:30 PM Pager: [phone]

## 2015-01-27 DIAGNOSIS — J8410 Pulmonary fibrosis, unspecified: Secondary | ICD-10-CM

## 2015-01-27 DIAGNOSIS — E876 Hypokalemia: Secondary | ICD-10-CM

## 2015-01-27 DIAGNOSIS — K5080 Crohn's disease of both small and large intestine without complications: Secondary | ICD-10-CM

## 2015-01-27 DIAGNOSIS — Z7901 Long term (current) use of anticoagulants: Secondary | ICD-10-CM

## 2015-01-27 DIAGNOSIS — I4891 Unspecified atrial fibrillation: Secondary | ICD-10-CM

## 2015-01-27 DIAGNOSIS — I10 Essential (primary) hypertension: Secondary | ICD-10-CM

## 2015-01-27 LAB — BASIC METABOLIC PANEL
Anion gap: 8 (ref 5–15)
BUN: 13 mg/dL (ref 6–20)
CALCIUM: 8.4 mg/dL — AB (ref 8.9–10.3)
CO2: 25 mmol/L (ref 22–32)
Chloride: 104 mmol/L (ref 101–111)
Creatinine, Ser: 0.86 mg/dL (ref 0.61–1.24)
GFR calc Af Amer: >60 mL/min (ref >60)
GLUCOSE: 112 mg/dL — AB (ref 65–99)
Potassium: 3.8 mmol/L (ref 3.5–5.1)
Sodium: 137 mmol/L (ref 135–145)

## 2015-01-27 LAB — MAGNESIUM: MAGNESIUM: 1.6 mg/dL — AB (ref 1.7–2.4)

## 2015-01-27 LAB — CBC
HEMATOCRIT: 36.5 % — AB (ref 39.0–52.0)
Hemoglobin: 12.2 g/dL — ABNORMAL LOW (ref 13.0–17.0)
MCH: 31.9 pg (ref 26.0–34.0)
MCHC: 33.4 g/dL (ref 30.0–36.0)
MCV: 95.3 fL (ref 78.0–100.0)
Platelets: 204 10*3/uL (ref 150–400)
RBC: 3.83 MIL/uL — ABNORMAL LOW (ref 4.22–5.81)
RDW: 12.5 % (ref 11.5–15.5)
WBC: 11.2 10*3/uL — ABNORMAL HIGH (ref 4.0–10.5)

## 2015-01-27 LAB — PROTIME-INR
INR: 3.95 — AB (ref 0.00–1.49)
Prothrombin Time: 37.6 seconds — ABNORMAL HIGH (ref 11.6–15.2)

## 2015-01-27 LAB — HIV ANTIBODY (ROUTINE TESTING W REFLEX): HIV Screen 4th Generation wRfx: NONREACTIVE

## 2015-01-27 MED ORDER — PREDNISONE 20 MG PO TABS: ORAL_TABLET | ORAL | Status: DC

## 2015-01-27 MED ORDER — GUAIFENESIN-DM 100-10 MG/5ML PO SYRP: 5.0000 mL | ORAL_SOLUTION | ORAL | Status: DC | PRN

## 2015-01-27 MED ORDER — FAMOTIDINE 40 MG PO TABS: 40.0000 mg | ORAL_TABLET | Freq: Every day | ORAL | Status: DC

## 2015-01-27 MED ORDER — AMOXICILLIN-POT CLAVULANATE 875-125 MG PO TABS: 1.0000 | ORAL_TABLET | Freq: Two times a day (BID) | ORAL | Status: DC

## 2015-01-27 NOTE — Progress Notes (Signed)
Nutrition Brief Note  Patient identified on the Malnutrition Screening Tool (MST) Report.  Pt has had a 4% weight loss since September 2016; not significant for time frame.  Wt Readings from Last 15 Encounters:  01/27/15 139 lb 15.9 oz (63.5 kg)  01/24/15 142 lb 2 oz (64.467 kg)  12/13/14 145 lb 2 oz (65.828 kg)  12/12/14 144 lb (65.318 kg)  07/25/14 145 lb 4 oz (65.885 kg)  03/22/14 149 lb 6 oz (67.756 kg)  01/25/14 153 lb (69.4 kg)  11/04/13 150 lb 6 oz (68.21 kg)  10/24/13 148 lb 2.4 oz (67.2 kg)  09/22/13 148 lb (67.132 kg)  08/18/13 150 lb 12.8 oz (68.402 kg)  07/18/13 149 lb 8 oz (67.813 kg)  07/08/13 147 lb 9.6 oz (66.951 kg)  07/06/13 148 lb (67.132 kg)  07/01/13 152 lb (68.947 kg)    Body mass index is 19.13 kg/(m^2). Patient meets criteria for Normal based on current BMI.   Current diet order is Heart Healthy, patient is consuming approximately 75% of meals at this time. Labs and medications reviewed.   No nutrition interventions warranted at this time. If nutrition issues arise, please consult RD.   Arthur Holms, RD, LDN Pager #: [phone] After-Hours Pager #: [phone]

## 2015-01-27 NOTE — Progress Notes (Signed)
ANTICOAGULATION CONSULT NOTE - Initial Consult  Pharmacy Consult for warfarin Indication: atrial fibrillation  No Known Allergies  Patient Measurements: Height: 5' 11.75" (182.2 cm) Weight: 139 lb 15.9 oz (63.5 kg) IBW/kg (Calculated) : 77.03  Vital Signs: Temp: 98.7 F (37.1 C) (11/05 0442) Temp Source: Oral (11/05 0442) BP: 126/64 mmHg (11/05 0442) Pulse Rate: 87 (11/05 0442)  Labs:  Recent Labs  01/26/15 0950 01/27/15 0331  HGB 13.6 12.2*  HCT 39.4 36.5*  PLT 212 204  LABPROT 30.6* 37.6*  INR 3.00* 3.95*  CREATININE 0.99 0.86     Assessment: 72 yoM with hx of pulmonary fibrosis on 02 at home presents with worsening hypoxia/SOB over last few days. Has chronic Afib on warfarin PTA 4 mg MWF, 3.5 MG AOD. Started on azithromycin yesterday. INR on admission was 3.00. Now it is supratherapeutic at 3.95. Hgb 12.2, plts wnl. No s/s of bleed.  Goal of Therapy:  INR 2-3 Monitor platelets by anticoagulation protocol: Yes   Plan:  Hold coumadin tonight Monitor daily INR, CBC, s/s of bleed  Elenor Quinones, PharmD Clinical Pharmacist Pager [phone] 01/27/2015 10:20 AM

## 2015-01-27 NOTE — Discharge Summary (Signed)
Physician Discharge Summary  Brian [name] EQA:[id number] DOB: [birth year] DOA: 01/26/2015  PCP: [name], MD  Admit date: 01/26/2015 Discharge date: 01/27/2015  Time spent: 40 minutes  Recommendations for Outpatient Follow-up:  1. Repeat chest x-ray in 3-4 weeks to follow resolution of infiltrates 2. Check a basic metabolic panel to follow electrolytes and renal function  Discharge Diagnoses:  CAP (community acquired pneumonia) Acute on chronic respiratory failure Chronic atrial fibrillation Essential hypertension CROHN'S DISEASE-LARGE & SMALL INTESTINE Long term (current) use of anticoagulants Pulmonary fibrosis (HCC) GERD/GI protection Dyslipidemia Anxiety  Discharge Condition: Stable and improved. Patient has been discharged home in a stable condition and with instructions to follow-up with his pulmonologist in 1 week. Also will follow with his PCP in 2 weeks.  Diet recommendation: Heart healthy diet  Filed Weights   01/26/15 0938 01/27/15 0442  Weight: 64.411 kg (142 lb) 63.5 kg (139 lb 15.9 oz)    History of present illness:  81 y.o. male, with history of pulmonary fibrosis who follows with [name], is on 2 L nasal cannula oxygen chronically, history of Crohn's disease in good control for several years, remote history of smoking quit close to 20 years ago, essential hypertension, chronic atrial fibrillation on Coumadin, BPH, B-12 deficiency who comes to the hospital after experiencing 3-4 day history of gradually progressive shortness of breath and a dry cough. He is also experiencing generalized weakness and lack of appetite. Denies any fever or chills, denies any exposure to sick contacts or recent travel, no history of DVT PE or swelling in legs, no orthopnea or PND.  Hospital Course:  1. Community-acquired pneumonia causing acute on chronic hypoxic respiratory failure.  Patient without fever and with improvement of his breathing Slight wheezing appreciated on  exam. Will discharge on antibiotics by mouth and a quick short tapering steroids course Patient instructed to use guaifenesin/dextromethorphan for expectoration and cough control. Also Instructed to use flutter valve Will follow with pulmonologist in 1 week Patient will continue the use of oxygen supplementation (3-4 L with instructions to wean as tolerated after his week of antibiotics completed)  2. History of pulmonary fibrosis on 2 L nasal cannula oxygen.  Continue outpatient Follow up with [name]. Continue pirfenidone TID  3. History of Crohn's disease. Stable. Continue Pentasa and B-12 supplementation.  4. Chronic atrial fibrillation. Chads were asked to score is 3.  Will continue bystolic/norvasc for rate control (as previously indicated) Continue coumadin for anticoagulation purposes   5. Essential hypertension.  A stable and well control. Patient will continue the use of Bystolic Advised to follow a heart healthy diet  6. Hypokalemia. Repleted and within normal limits at discharge  7. dyslipidemia: Continue the use of gemfibrozil  8. anxiety: Continue the use of Buspar  9. GI protection/GERD: Will discharge on Pepcid  Procedures:  See below for x-ray reports  Consultations:  None  Discharge Exam: Filed Vitals:   01/27/15 1347  BP: 105/57  Pulse: 87  Temp: 98.1 F (36.7 C)  Resp: 19    General: Afebrile, feeling better, reports some improvement in his breathing. No nausea, no vomiting. Patient denies chest pain, palpitations, lightheadedness or any other acute complaints Cardiovascular: Regular control, no rubs, no gallops Respiratory: Good air movement, mild expiratory wheezing and scattered rhonchi. Abdomen: Soft, nontender, positive bowel sounds, no distention  Discharge Instructions   Discharge Instructions    Diet - low sodium heart healthy    Complete by:  As directed  Discharge instructions    Complete by:  As directed    Arrange follow-up with pulmonologist in 1 week Take medications as prescribed Please start using oxygen supplementation 3-4 L 24/7 for the next 7-8 days and then wean off as tolerated toward you chronic amount of oxygen supplementation. Keep herself well-hydrated Build up activity as tolerated Please use flutter valve every 3-4 hours daily (8-10 repetition)          Current Discharge Medication List    START taking these medications   Details  amoxicillin-clavulanate (AUGMENTIN) 875-125 MG tablet Take 1 tablet by mouth 2 (two) times daily. Qty: 16 tablet, Refills: 0    famotidine (PEPCID) 40 MG tablet Take 1 tablet (40 mg total) by mouth daily. Qty: 30 tablet, Refills: 0    guaiFENesin-dextromethorphan (ROBITUSSIN DM) 100-10 MG/5ML syrup Take 5 mLs by mouth every 4 (four) hours as needed for cough. Qty: 236 mL, Refills: 0    predniSONE (DELTASONE) 20 MG tablet Take 2 tablets by mouth for 2 days; then 1 tablet by mouth for 3 days; then half tablet by mouth daily for 3 days and stop prednisone. Qty: 10 tablet, Refills: 0      CONTINUE these medications which have NOT CHANGED   Details  acetaminophen (TYLENOL) 325 MG tablet Take 650 mg by mouth as needed for pain.    amLODipine (NORVASC) 5 MG tablet Take 1 tablet (5 mg total) by mouth daily. Qty: 90 tablet, Refills: 3    busPIRone (BUSPAR) 15 MG tablet Take 1 tablet (15 mg total) by mouth 2 (two) times daily. Qty: 180 tablet, Refills: 0    calcium citrate (CALCITRATE - DOSED IN MG ELEMENTAL CALCIUM) 950 MG tablet Take 200 mg of elemental calcium by mouth daily. Pt take QOD    clonazePAM (KLONOPIN) 0.5 MG tablet Take 0.5 tablets (0.25 mg total) by mouth 3 (three) times daily as needed for anxiety. Qty: 40 tablet, Refills: 0    Cyanocobalamin (NASCOBAL) 500 MCG/0.1ML SOLN Use one spray nasally per week as directed. Qty: 3 Bottle, Refills: 2    gemfibrozil (LOPID) 600 MG tablet Take 1 tablet (600 mg total) by mouth 2 (two)  times daily. Qty: 180 tablet, Refills: 3    mesalamine (PENTASA) 250 MG CR capsule Take 2 capsules (500 mg total) by mouth 2 (two) times daily. Qty: 360 capsule, Refills: 3    nebivolol (BYSTOLIC) 5 MG tablet Take 1 tablet (5 mg total) by mouth daily. Qty: 90 tablet, Refills: 0    Pirfenidone 267 MG CAPS Take 3 capsules by mouth 3 (three) times daily.    Vitamin D, Ergocalciferol, (DRISDOL) 50000 UNITS CAPS capsule Take 50,000 Units by mouth every 7 (seven) days.    warfarin (COUMADIN) 2 MG tablet Take 1 tablet (2 mg total) by mouth as directed. Qty: 150 tablet, Refills: 0    OXYGEN Inhale into the lungs. 2 liters    psyllium (METAMUCIL SMOOTH TEXTURE) 28 % packet Take 1 packet by mouth daily as needed. For constipation       No Known Allergies Follow-up Information    Follow up with [name], MD. Schedule an appointment as soon as possible for a visit in 2 weeks.   Specialty:  Internal Medicine   Contact information:   [address] [place] [address] [phone]       The results of significant diagnostics from this hospitalization (including imaging, microbiology, ancillary and laboratory) are listed below for reference.  Significant Diagnostic Studies: Dg Chest 2 View  01/26/2015  CLINICAL DATA:  Acute shortness of breath for 1 week. History of pulmonary fibrosis and COPD EXAM: CHEST  2 VIEW COMPARISON:  06/07/2013 FINDINGS: New patchy right upper lobe airspace opacity abutting the fissure compatible with superimposed pneumonia. Background COPD/ emphysema evident with chronic basilar pulmonary fibrosis. Stable left midline calcified granuloma. Chronic apical scarring evident. Stable heart size and vascularity. No associated effusion or edema pattern. Trachea is midline. No acute osseous finding. IMPRESSION: Patchy right upper lobe pneumonia superimposed on chronic COPD and basilar fibrosis Remote granulomatous disease Recommend short-term radiographic  follow-up after medical therapy to document resolution. Electronically Signed   By: Jerilynn Mages.  Shick M.D.   On: 01/26/2015 10:33    Microbiology: Recent Results (from the past 240 hour(s))  Culture, blood (routine x 2)     Status: None (Preliminary result)   Collection Time: 01/26/15  1:05 PM  Result Value Ref Range Status   Specimen Description BLOOD RIGHT WRIST  Final   Special Requests IN PEDIATRIC BOTTLE 4CC  Final   Culture NO GROWTH < 24 HOURS  Final   Report Status PENDING  Incomplete  Culture, blood (routine x 2)     Status: None (Preliminary result)   Collection Time: 01/26/15  1:09 PM  Result Value Ref Range Status   Specimen Description BLOOD RIGHT HAND  Final   Special Requests IN PEDIATRIC BOTTLE 4CC  Final   Culture NO GROWTH < 24 HOURS  Final   Report Status PENDING  Incomplete     Labs: Basic Metabolic Panel:  Recent Labs Lab 01/26/15 0950 01/27/15 0331  NA 137 137  K 3.2* 3.8  CL 105 104  CO2 22 25  GLUCOSE 143* 112*  BUN 17 13  CREATININE 0.99 0.86  CALCIUM 9.0 8.4*  MG  --  1.6*   CBC:  Recent Labs Lab 01/26/15 0950 01/27/15 0331  WBC 11.1* 11.2*  NEUTROABS 9.4*  --   HGB 13.6 12.2*  HCT 39.4 36.5*  MCV 94.3 95.3  PLT 212 204   CBG:  Recent Labs Lab 01/26/15 1216  GLUCAP 110*    Signed:  Barton Dubois  [facility] 01/27/2015, 2:09 PM

## 2015-01-30 ENCOUNTER — Telehealth: Payer: Self-pay

## 2015-01-30 NOTE — Telephone Encounter (Signed)
noted 

## 2015-01-30 NOTE — Telephone Encounter (Signed)
PCP: [name], MD  Admit date: 01/26/2015 Discharge date: 01/27/2015  Reason for admission:  CAP (community acquired pneumonia), Acute on chronic respiratory failure  Recommendations for Outpatient Follow-up:  1. Repeat chest x-ray in 3-4 weeks to follow resolution of infiltrates 2. Check a basic metabolic panel to follow electrolytes and renal function  Hospital follow up appt scheduled:  02/01/15 @ 11:30 pm with Dr. Larose Kells   Transition Care Management Follow-up Telephone Call  How have you been since you were released from the hospital? Pt states he's feels pretty good.  Appetite has picked up, but is weaker than he was prior to hospitalization.  Chief complaint: mild shortness of breath that worsens with exertion.  Wearing O2 @ 4L [place].  O2 Sats 95-97% at rest, but drops with activity.  Lowest O2 sat with activity 76%.  Taking medications as prescribed.  Using incentive spirometer every 4 hours and performing coughing and deep breathing exercises.  Sleeping in recliner due to fear of laying down.  Denies fever, cough, wheezing, chest pain, and increased heart rate.     Do you understand why you were in the hospital? yes   Do you understand the discharge instructions? yes  Items Reviewed:  Medications reviewed: yes  Allergies reviewed: yes  Dietary changes reviewed: yes, heart healthy diet.  Encouraged to stay hydrated.    Referrals reviewed: yes, pulmonology--has an appt on 02/08/15 @ 1:30 pm per pt.     Functional Questionnaire:   Activities of Daily Living (ADLs):   He states they are independent in the following: ambulation, bathing and hygiene, feeding, continence, grooming, toileting and dressing.  Pt states he just has to move slowly.    States they require assistance with the following: N/a   Any transportation issues/concerns?: no, wife provides transportation    Any patient concerns? no   Confirmed importance and date/time of follow-up visits scheduled:  yes   Confirmed with patient if condition begins to worsen call PCP or go to the ER: yes

## 2015-01-31 ENCOUNTER — Emergency Department (HOSPITAL_COMMUNITY): Payer: Medicare Other

## 2015-01-31 ENCOUNTER — Observation Stay (HOSPITAL_COMMUNITY): Payer: Medicare Other

## 2015-01-31 ENCOUNTER — Observation Stay (HOSPITAL_BASED_OUTPATIENT_CLINIC_OR_DEPARTMENT_OTHER): Payer: Medicare Other

## 2015-01-31 ENCOUNTER — Inpatient Hospital Stay (HOSPITAL_COMMUNITY)
Admission: EM | Admit: 2015-01-31 | Discharge: 2015-02-02 | DRG: 064 | Disposition: A | Discharge status: Rehabilitation Facility | Payer: Medicare Other | Attending: Internal Medicine | Admitting: Internal Medicine

## 2015-01-31 ENCOUNTER — Encounter (HOSPITAL_COMMUNITY): Payer: Self-pay | Admitting: Radiology

## 2015-01-31 DIAGNOSIS — K5080 Crohn's disease of both small and large intestine without complications: Secondary | ICD-10-CM | POA: Diagnosis present

## 2015-01-31 DIAGNOSIS — R4701 Aphasia: Secondary | ICD-10-CM | POA: Diagnosis not present

## 2015-01-31 DIAGNOSIS — Z87891 Personal history of nicotine dependence: Secondary | ICD-10-CM

## 2015-01-31 DIAGNOSIS — J8410 Pulmonary fibrosis, unspecified: Secondary | ICD-10-CM | POA: Diagnosis not present

## 2015-01-31 DIAGNOSIS — R29710 NIHSS score 10: Secondary | ICD-10-CM | POA: Diagnosis present

## 2015-01-31 DIAGNOSIS — R4702 Dysphasia: Secondary | ICD-10-CM | POA: Diagnosis present

## 2015-01-31 DIAGNOSIS — F329 Major depressive disorder, single episode, unspecified: Secondary | ICD-10-CM | POA: Diagnosis present

## 2015-01-31 DIAGNOSIS — Z85828 Personal history of other malignant neoplasm of skin: Secondary | ICD-10-CM

## 2015-01-31 DIAGNOSIS — I639 Cerebral infarction, unspecified: Secondary | ICD-10-CM | POA: Diagnosis present

## 2015-01-31 DIAGNOSIS — R471 Dysarthria and anarthria: Secondary | ICD-10-CM | POA: Diagnosis present

## 2015-01-31 DIAGNOSIS — I4891 Unspecified atrial fibrillation: Secondary | ICD-10-CM | POA: Diagnosis present

## 2015-01-31 DIAGNOSIS — M8580 Other specified disorders of bone density and structure, unspecified site: Secondary | ICD-10-CM | POA: Diagnosis present

## 2015-01-31 DIAGNOSIS — J189 Pneumonia, unspecified organism: Secondary | ICD-10-CM

## 2015-01-31 DIAGNOSIS — I6789 Other cerebrovascular disease: Secondary | ICD-10-CM

## 2015-01-31 DIAGNOSIS — I63412 Cerebral infarction due to embolism of left middle cerebral artery: Principal | ICD-10-CM | POA: Diagnosis present

## 2015-01-31 DIAGNOSIS — R29810 Facial weakness: Secondary | ICD-10-CM | POA: Diagnosis not present

## 2015-01-31 DIAGNOSIS — Z9981 Dependence on supplemental oxygen: Secondary | ICD-10-CM | POA: Diagnosis not present

## 2015-01-31 DIAGNOSIS — I10 Essential (primary) hypertension: Secondary | ICD-10-CM | POA: Diagnosis not present

## 2015-01-31 DIAGNOSIS — Z7901 Long term (current) use of anticoagulants: Secondary | ICD-10-CM | POA: Diagnosis not present

## 2015-01-31 DIAGNOSIS — Z85820 Personal history of malignant melanoma of skin: Secondary | ICD-10-CM

## 2015-01-31 DIAGNOSIS — R4781 Slurred speech: Secondary | ICD-10-CM | POA: Diagnosis not present

## 2015-01-31 DIAGNOSIS — R0600 Dyspnea, unspecified: Secondary | ICD-10-CM | POA: Diagnosis present

## 2015-01-31 DIAGNOSIS — E781 Pure hyperglyceridemia: Secondary | ICD-10-CM | POA: Diagnosis present

## 2015-01-31 DIAGNOSIS — Z4659 Encounter for fitting and adjustment of other gastrointestinal appliance and device: Secondary | ICD-10-CM

## 2015-01-31 DIAGNOSIS — K625 Hemorrhage of anus and rectum: Secondary | ICD-10-CM | POA: Diagnosis present

## 2015-01-31 DIAGNOSIS — H9190 Unspecified hearing loss, unspecified ear: Secondary | ICD-10-CM | POA: Diagnosis present

## 2015-01-31 LAB — I-STAT CHEM 8, ED
BUN: 20 mg/dL (ref 6–20)
CALCIUM ION: 1.22 mmol/L (ref 1.13–1.30)
CHLORIDE: 105 mmol/L (ref 101–111)
CREATININE: 0.8 mg/dL (ref 0.61–1.24)
GLUCOSE: 94 mg/dL (ref 65–99)
HCT: 44 % (ref 39.0–52.0)
Hemoglobin: 15 g/dL (ref 13.0–17.0)
POTASSIUM: 4 mmol/L (ref 3.5–5.1)
Sodium: 142 mmol/L (ref 135–145)
TCO2: 22 mmol/L (ref 0–100)

## 2015-01-31 LAB — I-STAT TROPONIN, ED: Troponin i, poc: 0.01 ng/mL (ref 0.00–0.08)

## 2015-01-31 LAB — CULTURE, BLOOD (ROUTINE X 2)
CULTURE: NO GROWTH
Culture: NO GROWTH

## 2015-01-31 LAB — COMPREHENSIVE METABOLIC PANEL
ALK PHOS: 118 U/L (ref 38–126)
ALT: 66 U/L — AB (ref 17–63)
AST: 54 U/L — ABNORMAL HIGH (ref 15–41)
Albumin: 2.1 g/dL — ABNORMAL LOW (ref 3.5–5.0)
Anion gap: 8 (ref 5–15)
BILIRUBIN TOTAL: 0.5 mg/dL (ref 0.3–1.2)
BUN: 17 mg/dL (ref 6–20)
CALCIUM: 9 mg/dL (ref 8.9–10.3)
CO2: 25 mmol/L (ref 22–32)
CREATININE: 0.83 mg/dL (ref 0.61–1.24)
Chloride: 107 mmol/L (ref 101–111)
GFR calc non Af Amer: >60 mL/min (ref >60)
GLUCOSE: 96 mg/dL (ref 65–99)
Potassium: 4 mmol/L (ref 3.5–5.1)
SODIUM: 140 mmol/L (ref 135–145)
TOTAL PROTEIN: 6.6 g/dL (ref 6.5–8.1)

## 2015-01-31 LAB — CBC
HEMATOCRIT: 40.6 % (ref 39.0–52.0)
HEMOGLOBIN: 14 g/dL (ref 13.0–17.0)
MCH: 32.8 pg (ref 26.0–34.0)
MCHC: 34.5 g/dL (ref 30.0–36.0)
MCV: 95.1 fL (ref 78.0–100.0)
PLATELETS: 222 10*3/uL (ref 150–400)
RBC: 4.27 MIL/uL (ref 4.22–5.81)
RDW: 12.5 % (ref 11.5–15.5)
WBC: 14.3 10*3/uL — AB (ref 4.0–10.5)

## 2015-01-31 LAB — URINALYSIS, ROUTINE W REFLEX MICROSCOPIC
Bilirubin Urine: NEGATIVE
GLUCOSE, UA: NEGATIVE mg/dL
HGB URINE DIPSTICK: NEGATIVE
Ketones, ur: NEGATIVE mg/dL
LEUKOCYTES UA: NEGATIVE
Nitrite: NEGATIVE
PH: 5 (ref 5.0–8.0)
Protein, ur: NEGATIVE mg/dL
SPECIFIC GRAVITY, URINE: 1.025 (ref 1.005–1.030)
Urobilinogen, UA: 0.2 mg/dL (ref 0.0–1.0)

## 2015-01-31 LAB — DIFFERENTIAL
Basophils Absolute: 0 10*3/uL (ref 0.0–0.1)
Basophils Relative: 0 %
EOS PCT: 2 %
Eosinophils Absolute: 0.3 10*3/uL (ref 0.0–0.7)
LYMPHS ABS: 1.3 10*3/uL (ref 0.7–4.0)
LYMPHS PCT: 9 %
MONOS PCT: 6 %
Monocytes Absolute: 0.9 10*3/uL (ref 0.1–1.0)
NEUTROS ABS: 11.8 10*3/uL — AB (ref 1.7–7.7)
Neutrophils Relative %: 83 %

## 2015-01-31 LAB — INFLUENZA PANEL BY PCR (TYPE A & B)
H1N1FLUPCR: NOT DETECTED
INFLBPCR: NEGATIVE
Influenza A By PCR: NEGATIVE

## 2015-01-31 LAB — CBG MONITORING, ED: Glucose-Capillary: 86 mg/dL (ref 65–99)

## 2015-01-31 LAB — PROTIME-INR
INR: 1.97 — ABNORMAL HIGH (ref 0.00–1.49)
Prothrombin Time: 22.3 seconds — ABNORMAL HIGH (ref 11.6–15.2)

## 2015-01-31 LAB — APTT: aPTT: 32 seconds (ref 24–37)

## 2015-01-31 MED ORDER — PIPERACILLIN-TAZOBACTAM 3.375 G IVPB 30 MIN: 3.3750 g | Freq: Three times a day (TID) | INTRAVENOUS | Status: DC

## 2015-01-31 MED ORDER — RESOURCE THICKENUP CLEAR PO POWD
ORAL | Status: DC | PRN
  Filled 2015-01-31 (×2): qty 125

## 2015-01-31 MED ORDER — GUAIFENESIN-DM 100-10 MG/5ML PO SYRP: 5.0000 mL | ORAL_SOLUTION | ORAL | Status: DC | PRN

## 2015-01-31 MED ORDER — BUSPIRONE HCL 10 MG PO TABS
15.0000 mg | ORAL_TABLET | Freq: Two times a day (BID) | ORAL | Status: DC
  Administered 2015-01-31 – 2015-02-02 (×5): 15 mg via ORAL
  Filled 2015-01-31 (×5): qty 2

## 2015-01-31 MED ORDER — GEMFIBROZIL 600 MG PO TABS
600.0000 mg | ORAL_TABLET | Freq: Two times a day (BID) | ORAL | Status: DC
  Administered 2015-01-31 – 2015-02-02 (×5): 600 mg via ORAL
  Filled 2015-01-31 (×7): qty 1

## 2015-01-31 MED ORDER — PIPERACILLIN-TAZOBACTAM 3.375 G IVPB
3.3750 g | Freq: Three times a day (TID) | INTRAVENOUS | Status: DC
  Administered 2015-01-31 – 2015-02-02 (×5): 3.375 g via INTRAVENOUS
  Filled 2015-01-31 (×7): qty 50

## 2015-01-31 MED ORDER — CLONAZEPAM 0.5 MG PO TABS: 0.2500 mg | ORAL_TABLET | Freq: Three times a day (TID) | ORAL | Status: DC | PRN

## 2015-01-31 MED ORDER — MESALAMINE ER 250 MG PO CPCR
500.0000 mg | ORAL_CAPSULE | Freq: Two times a day (BID) | ORAL | Status: DC
  Administered 2015-02-01 – 2015-02-02 (×4): 500 mg via ORAL
  Filled 2015-01-31 (×7): qty 2

## 2015-01-31 MED ORDER — APIXABAN 5 MG PO TABS
5.0000 mg | ORAL_TABLET | Freq: Two times a day (BID) | ORAL | Status: DC
  Administered 2015-01-31 – 2015-02-02 (×5): 5 mg via ORAL
  Filled 2015-01-31 (×7): qty 1

## 2015-01-31 MED ORDER — PIRFENIDONE 267 MG PO CAPS
3.0000 | ORAL_CAPSULE | Freq: Three times a day (TID) | ORAL | Status: DC
  Administered 2015-01-31 – 2015-02-01 (×4): 3 via ORAL
  Filled 2015-01-31: qty 3

## 2015-01-31 MED ORDER — STROKE: EARLY STAGES OF RECOVERY BOOK
Freq: Once | Status: AC
  Administered 2015-01-31: 23:00:00
  Filled 2015-01-31: qty 1

## 2015-01-31 MED ORDER — IOHEXOL 350 MG/ML SOLN
50.0000 mL | Freq: Once | INTRAVENOUS | Status: AC | PRN
  Administered 2015-01-31: 50 mL via INTRAVENOUS

## 2015-01-31 MED ORDER — AZELASTINE HCL 0.1 % NA SOLN
1.0000 | Freq: Two times a day (BID) | NASAL | Status: DC | PRN
  Filled 2015-01-31: qty 30

## 2015-01-31 MED ORDER — LEVOFLOXACIN IN D5W 500 MG/100ML IV SOLN
500.0000 mg | INTRAVENOUS | Status: DC
  Administered 2015-01-31: 500 mg via INTRAVENOUS
  Filled 2015-01-31: qty 100

## 2015-01-31 MED ORDER — SODIUM CHLORIDE 0.9 % IV SOLN
INTRAVENOUS | Status: AC
  Administered 2015-01-31: 17:00:00 via INTRAVENOUS

## 2015-01-31 MED ORDER — WHITE PETROLATUM GEL
Status: AC
  Administered 2015-01-31: 20:00:00
  Filled 2015-01-31: qty 1

## 2015-01-31 MED ORDER — HYDRALAZINE HCL 20 MG/ML IJ SOLN: 5.0000 mg | INTRAMUSCULAR | Status: DC | PRN

## 2015-01-31 MED ORDER — AMOXICILLIN-POT CLAVULANATE 875-125 MG PO TABS: 1.0000 | ORAL_TABLET | Freq: Two times a day (BID) | ORAL | Status: DC

## 2015-01-31 MED ORDER — VANCOMYCIN HCL IN DEXTROSE 750-5 MG/150ML-% IV SOLN
750.0000 mg | Freq: Two times a day (BID) | INTRAVENOUS | Status: DC
  Administered 2015-01-31 – 2015-02-02 (×4): 750 mg via INTRAVENOUS
  Filled 2015-01-31 (×6): qty 150

## 2015-01-31 NOTE — Progress Notes (Signed)
*  PRELIMINARY RESULTS* Echocardiogram 2D Echocardiogram has been performed.  Brian [name] 01/31/2015, 4:41 PM

## 2015-01-31 NOTE — ED Provider Notes (Signed)
CSN: [id number]     Arrival date & time 01/31/15  7425 History   First MD Initiated Contact with Patient 01/31/15 [phone]     Chief Complaint  Patient presents with  . Code Stroke     (Consider location/radiation/quality/duration/timing/severity/associated sxs/prior Treatment) HPI   Patient's an 81 year old male presenting as code stroke. Patient had recent hospitalization for community acquired pneumonia. Patient was living at home and EMS was called for strokelike symptoms. According to EMS several prehospital providers saw a right facial droop. On arrival to the ED there is no facial drooping noted. Patient had negative pronator drift. Patient is very hard of hearing exam was otherwise non-neurologically focal.   Patient on blood thinners.  Past Medical History  Diagnosis Date  . Muscle tremor     saw neurology remotely elsewhere, was Rx inderal  . Crohn's ileocolitis ([place])   . Osteopenia     per DEXA 11/2008  . Hypertension 11/11    mild  . Hemorrhoids   . Hypertrophy of prostate     w/o UR obst & oth luts  . Peripheral neuropathy (HCC)     h/o neg w/u  . ED (erectile dysfunction)   . Hypertriglyceridemia   . COPD (chronic obstructive pulmonary disease) ([place])     recent dx--no acute problems  . Hiatal hernia 1956    found while in service  . Anxiety   . Atrial fibrillation with RVR ([place])   . Long term (current) use of anticoagulants 02/21/2011  . Serrated adenoma of colon 03/2010  . Renal cyst     bilateral  . Pulmonary fibrosis ([place]) 2015    Rx Esbriet ~ 10-2013 ([name]), Dr. [name]  . On home oxygen therapy     "2L; 20-24h for the past week" (01/26/2015)  . Emphysema lung ([place]) 2016    Dr. [name]  . B12 deficiency anemia   . Idiopathic pulmonary fibrosis ([place]) 2016    Dr. [name]  . Pneumonia ?2015  . CAP (community acquired pneumonia) 01/26/2015  . Osteoarthritis     "maybe in my hands, feet" (01/26/2015)  . Depression   . Squamous cell carcinoma of skin of  scalp     tx'd ~ 60yrs; "froze them off"  . Squamous cell carcinoma, face      "froze them off"  . Melanoma of scalp [facility])    Past Surgical History  Procedure Laterality Date  . Colon resection  1984    resection of distal ileum and cecum w/ appendectomy, 18-inch small intestine, for Crohn's disease  . Colon surgery    . Appendectomy  1984  . Cataract extraction w/ intraocular lens  implant, bilateral Bilateral 2007  . Cholecystectomy  02/17/2011    Procedure: LAPAROSCOPIC CHOLECYSTECTOMY WITH INTRAOPERATIVE CHOLANGIOGRAM;  Surgeon: Edward Jolly, MD;  Location: [facility] ORS;  Service: General;  Laterality: N/A;  . Mohs surgery Right ~ 2014    "side of my scalp"   Family History  Problem Relation Age of Onset  . Crohn's disease Brother   . Breast cancer Mother   . Colon cancer Neg Hx   . Prostate cancer Neg Hx   . Heart attack Father 16  . Dementia Sister   . Diabetes Sister   . Stroke Mother 82   Social History  Substance Use Topics  . Smoking status: Former Smoker -- 1.00 packs/day for 40 years    Types: Cigarettes, Pipe    Quit date: 03/24/1990  . Smokeless tobacco: Never Used  .  Alcohol Use: No     Comment:      Review of Systems  Respiratory: Negative for shortness of breath.   Cardiovascular: Negative for chest pain.  Gastrointestinal: Negative for abdominal pain.  Psychiatric/Behavioral: Positive for confusion and agitation.      Allergies  Review of patient's allergies indicates no known allergies.  Home Medications   Prior to Admission medications   Medication Sig Start Date End Date Taking? Authorizing Provider  acetaminophen (TYLENOL) 325 MG tablet Take 650 mg by mouth as needed for pain.    Historical Provider, MD  amLODipine (NORVASC) 5 MG tablet Take 1 tablet (5 mg total) by mouth daily. 07/25/14   Colon Branch, MD  amoxicillin-clavulanate (AUGMENTIN) 875-125 MG tablet Take 1 tablet by mouth 2 (two) times daily. 01/27/15   Barton Dubois, MD  busPIRone  (BUSPAR) 15 MG tablet Take 1 tablet (15 mg total) by mouth 2 (two) times daily. 11/22/14   Colon Branch, MD  calcium citrate (CALCITRATE - DOSED IN MG ELEMENTAL CALCIUM) 950 MG tablet Take 200 mg of elemental calcium by mouth daily. Pt take QOD    Historical Provider, MD  clonazePAM (KLONOPIN) 0.5 MG tablet Take 0.5 tablets (0.25 mg total) by mouth 3 (three) times daily as needed for anxiety. Patient taking differently: Take 0.125 mg by mouth 3 (three) times daily as needed for anxiety.  01/24/15   Colon Branch, MD  Cyanocobalamin (NASCOBAL) 500 MCG/0.1ML SOLN Use one spray nasally per week as directed. Patient taking differently: Place 0.1 mLs into the nose every 7 (seven) days. Use one spray nasally per week as directed. 12/14/14   Colon Branch, MD  famotidine (PEPCID) 40 MG tablet Take 1 tablet (40 mg total) by mouth daily. 01/27/15   Barton Dubois, MD  gemfibrozil (LOPID) 600 MG tablet Take 1 tablet (600 mg total) by mouth 2 (two) times daily. 07/25/14   Colon Branch, MD  guaiFENesin-dextromethorphan [facility] DM) 100-10 MG/5ML syrup Take 5 mLs by mouth every 4 (four) hours as needed for cough. 01/27/15   Barton Dubois, MD  mesalamine (PENTASA) 250 MG CR capsule Take 2 capsules (500 mg total) by mouth 2 (two) times daily. 12/12/14   Ladene Artist, MD  nebivolol (BYSTOLIC) 5 MG tablet Take 1 tablet (5 mg total) by mouth daily. 12/26/14   Larey Dresser, MD  OXYGEN Inhale into the lungs. 2 liters    Historical Provider, MD  Pirfenidone 267 MG CAPS Take 3 capsules by mouth 3 (three) times daily.    Historical Provider, MD  predniSONE (DELTASONE) 20 MG tablet Take 2 tablets by mouth for 2 days; then 1 tablet by mouth for 3 days; then half tablet by mouth daily for 3 days and stop prednisone. 01/27/15   Barton Dubois, MD  psyllium (METAMUCIL SMOOTH TEXTURE) 28 % packet Take 1 packet by mouth daily as needed. For constipation    Historical Provider, MD  Vitamin D, Ergocalciferol, (DRISDOL) 50000 UNITS CAPS capsule  Take 50,000 Units by mouth every 7 (seven) days.    Historical Provider, MD  warfarin (COUMADIN) 2 MG tablet Take 1 tablet (2 mg total) by mouth as directed. Patient taking differently: Take 3-4 mg by mouth daily. Take 2 tablets on Monday, Wednesday, & Friday Take 1.5 tablets on all other days 10/04/14   Larey Dresser, MD   BP 144/69 mmHg  Pulse 74  Temp(Src) 98 F (36.7 C) (Oral)  Resp 15  Wt 145 lb 1  oz (65.8 kg)  SpO2 83% Physical Exam  Constitutional: He appears well-nourished.  HENT:  Head: Normocephalic.  Mouth/Throat: Oropharynx is clear and moist.  Eyes: Conjunctivae are normal.  Neck: No tracheal deviation present.  Cardiovascular: Normal rate.   Pulmonary/Chest: Effort normal. No stridor. No respiratory distress.  Abdominal: Soft. There is no tenderness. There is no guarding.  Musculoskeletal: Normal range of motion. He exhibits no edema.  Neurological:  Patient having non-fluid speech. Having difficulty following commands. Patient is alert. No facial weakness. Pupils equal and responsive.  Skin: Skin is warm and dry. No rash noted. He is not diaphoretic.  Nursing note and vitals reviewed.   ED Course  Procedures (including critical care time) Labs Review Labs Reviewed  PROTIME-INR - Abnormal; Notable for the following:    Prothrombin Time 22.3 (*)    INR 1.97 (*)    All other components within normal limits  CBC - Abnormal; Notable for the following:    WBC 14.3 (*)    All other components within normal limits  APTT  DIFFERENTIAL  COMPREHENSIVE METABOLIC PANEL  I-STAT TROPOININ, ED  I-STAT CHEM 8, ED  CBG MONITORING, ED    Imaging Review No results found. I have personally reviewed and evaluated these images and lab results as part of my medical decision-making.   EKG Interpretation   Date/Time:  Wednesday January 31 2015 09:33:37 EST Ventricular Rate:  74 PR Interval:    QRS Duration: 90 QT Interval:  392 QTC Calculation: 435 R Axis:    71 Text Interpretation:  Atrial fibrillation Probable anterior infarct, age  indeterminate Atrial fibrillation no acute ischemia. Confirmed by Gerald Leitz ([id number]) on 01/31/2015 9:39:34 AM      MDM   Final diagnoses:  None    Patient is an 81 year old male presenting with stroke like symptoms. According to EMS they noted right facial droop prior to arrival. On arrival patient is aphasic, confused, able to follow directions barely.. Difficult to tell whether it's result of hard of hearing, confusion, strokelike symptoms. Neurology saw patient. Patient on anticoagulation for A. fib. INR is 1.97. Therefore used to high for TPA. We will get a CTA to see if there is any occlusion that is amenable to IR.  10:00 AM  Patient CTA shows no large vessel occlusion. Likely distal branch of MCA given patient's aphasia. Admit to medicine with neurology following.   Connie Lasater Julio Alm, MD 01/31/15 1001

## 2015-01-31 NOTE — Evaluation (Signed)
Clinical/Bedside Swallow Evaluation Patient Details  Name: EILAN MCINERNY MRN: [id number] Date of Birth: 1928-07-18  Today's Date: 01/31/2015 Time: SLP Start Time (ACUTE ONLY): 1331 SLP Stop Time (ACUTE ONLY): 1356 SLP Time Calculation (min) (ACUTE ONLY): 25 min  Past Medical History:  Past Medical History  Diagnosis Date  . Muscle tremor     saw neurology remotely elsewhere, was Rx inderal  . Crohn's ileocolitis ([place])   . Osteopenia     per DEXA 11/2008  . Hypertension 11/11    mild  . Hemorrhoids   . Hypertrophy of prostate     w/o UR obst & oth luts  . Peripheral neuropathy (HCC)     h/o neg w/u  . ED (erectile dysfunction)   . Hypertriglyceridemia   . COPD (chronic obstructive pulmonary disease) ([place])     recent dx--no acute problems  . Hiatal hernia 1956    found while in service  . Anxiety   . Atrial fibrillation with RVR ([place])   . Long term (current) use of anticoagulants 02/21/2011  . Serrated adenoma of colon 03/2010  . Renal cyst     bilateral  . Pulmonary fibrosis ([place]) 2015    Rx Esbriet ~ 10-2013 ([name]), Dr. [name]  . On home oxygen therapy     "2L; 20-24h for the past week" (01/26/2015)  . Emphysema lung ([place]) 2016    Dr. [name]  . B12 deficiency anemia   . Idiopathic pulmonary fibrosis ([place]) 2016    Dr. [name]  . Pneumonia ?2015  . CAP (community acquired pneumonia) 01/26/2015  . Osteoarthritis     "maybe in my hands, feet" (01/26/2015)  . Depression   . Squamous cell carcinoma of skin of scalp     tx'd ~ 22yrs; "froze them off"  . Squamous cell carcinoma, face      "froze them off"  . Melanoma of scalp [facility])    Past Surgical History:  Past Surgical History  Procedure Laterality Date  . Colon resection  1984    resection of distal ileum and cecum w/ appendectomy, 18-inch small intestine, for Crohn's disease  . Colon surgery    . Appendectomy  1984  . Cataract extraction w/ intraocular lens  implant, bilateral Bilateral 2007  . Cholecystectomy   02/17/2011    Procedure: LAPAROSCOPIC CHOLECYSTECTOMY WITH INTRAOPERATIVE CHOLANGIOGRAM;  Surgeon: Edward Jolly, MD;  Location: [facility];  Service: General;  Laterality: N/A;  . Mohs surgery Right ~ 2014    "side of my scalp"   HPI:  81 y.o. male with pulmonary fibrosis, atrial fibrillation, Chron's, HTN, COPD, hiatal hernia and pna 01/26/15 admitted with right side weakness and inability to speak. CT negative, MRI pending. CXR Slight worsening of RIGHT upper lobe pneumonia superimposed on emphysematous change. BSE 11/4 without difficulty and regular/thin recommended.   Assessment / Plan / Recommendation Clinical Impression  Pt exhibits suspected compromised airway protection in addition to baseline cough (current pneumonia) therefore unable to qualify/qualify at bedside. Therapeutic intervention with various textures resulted in decreased s/s of aspiration with nectar thick liquids. Solid textures mildly delayed. Recommend Dys 2/nectar thick liquids, no straws and crush meds until MBS next date.        Aspiration Risk  Moderate    Diet Recommendation Dysphagia 2 (Fine chop);Nectar   Medication Administration: Crushed with puree Compensations: Slow rate;Small sips/bites;Check for pocketing    Other  Recommendations Oral Care Recommendations: Oral care BID   Follow Up Recommendations  Frequency and Duration min 2x/week  2 weeks   Pertinent Vitals/Pain none    SLP Swallow Goals     Swallow Study Prior Functional Status       General Other Pertinent Information: 81 y.o. male with pulmonary fibrosis, atrial fibrillation, Chron's, HTN, COPD, hiatal hernia and pna 01/26/15 admitted with right side weakness and inability to speak. CT negative, MRI pending. CXR Slight worsening of RIGHT upper lobe pneumonia superimposed on emphysematous change. BSE 11/4 without difficulty and regular/thin recommended. Type of Study: Bedside swallow evaluation Previous Swallow Assessment:  (see  HPI) Diet Prior to this Study: NPO Temperature Spikes Noted: No Respiratory Status: Supplemental O2 delivered via (comment) History of Recent Intubation: No Behavior/Cognition: Alert;Cooperative;Pleasant mood;Requires cueing Oral Cavity - Dentition: Edentulous (donned upper/lower dentures) Self-Feeding Abilities: Able to feed self Patient Positioning: Upright in bed Baseline Vocal Quality: Normal Volitional Cough: Strong Volitional Swallow: Unable to elicit    Oral/Motor/Sensory Function Overall Oral Motor/Sensory Function: Impaired Labial ROM:  (difficulty due to apraxia) Labial Symmetry: Within Functional Limits Labial Strength: Within Functional Limits Lingual ROM: Within Functional Limits Lingual Symmetry: Within Functional Limits Lingual Strength: Within Functional Limits Velum: Within Functional Limits Mandible: Within Functional Limits   Ice Chips Ice chips: Not tested   Thin Liquid Thin Liquid: Impaired Presentation: Cup;Straw Oral Phase Impairments:  (none) Pharyngeal  Phase Impairments: Cough - Immediate;Cough - Delayed;Throat Clearing - Immediate;Throat Clearing - Delayed;Suspected delayed Swallow    Nectar Thick Nectar Thick Liquid: Impaired Presentation: Cup Oral Phase Impairments:  (none) Pharyngeal Phase Impairments: Suspected delayed Swallow;Cough - Delayed (significantly less frequent than thin)   Honey Thick Honey Thick Liquid: Impaired Presentation: Cup Pharyngeal Phase Impairments: Suspected delayed Swallow (none)   Puree Puree: Impaired Pharyngeal Phase Impairments: Suspected delayed Swallow   Solid   GO    Solid: Impaired Oral Phase Impairments: Impaired anterior to posterior transit       Houston Siren 01/31/2015,2:25 PM  Orbie Pyo Colvin Caroli.Ed [employer] [phone]

## 2015-01-31 NOTE — Consult Note (Signed)
Referring Physician: Thomasene Lot    Chief Complaint: Stroke  HPI:                                                                                                                                         Brian [name] is an 81 y.o. male with known Afib and INR 1.9 who presented to ED as code stroke after wife noted sudden change in speech at 0800.  EMS was called and patient noted to be flaccid on the right arm, right facial droop and aphasic. While in ED right arm improved but patient remained aphasic with right field cut. CT head was negative and CTA brain neck and brain showed no large vessel occlusion.   Date last known well: Date: 01/31/2015 Time last known well: Time: 08:00  NIHSS: 10 tPA Given: No: INR 1.9     Past Medical History  Diagnosis Date  . Muscle tremor     saw neurology remotely elsewhere, was Rx inderal  . Crohn's ileocolitis ([place])   . Osteopenia     per DEXA 11/2008  . Hypertension 11/11    mild  . Hemorrhoids   . Hypertrophy of prostate     w/o UR obst & oth luts  . Peripheral neuropathy (HCC)     h/o neg w/u  . ED (erectile dysfunction)   . Hypertriglyceridemia   . COPD (chronic obstructive pulmonary disease) ([place])     recent dx--no acute problems  . Hiatal hernia 1956    found while in service  . Anxiety   . Atrial fibrillation with RVR ([place])   . Long term (current) use of anticoagulants 02/21/2011  . Serrated adenoma of colon 03/2010  . Renal cyst     bilateral  . Pulmonary fibrosis ([place]) 2015    Rx Esbriet ~ 10-2013 ([name]), Dr. [name]  . On home oxygen therapy     "2L; 20-24h for the past week" (01/26/2015)  . Emphysema lung ([place]) 2016    Dr. [name]  . B12 deficiency anemia   . Idiopathic pulmonary fibrosis ([place]) 2016    Dr. [name]  . Pneumonia ?2015  . CAP (community acquired pneumonia) 01/26/2015  . Osteoarthritis     "maybe in my hands, feet" (01/26/2015)  . Depression   . Squamous cell carcinoma of skin of scalp     tx'd ~ 93yrs; "froze them  off"  . Squamous cell carcinoma, face      "froze them off"  . Melanoma of scalp [facility])     Past Surgical History  Procedure Laterality Date  . Colon resection  1984    resection of distal ileum and cecum w/ appendectomy, 18-inch small intestine, for Crohn's disease  . Colon surgery    . Appendectomy  1984  . Cataract extraction w/ intraocular lens  implant, bilateral Bilateral 2007  . Cholecystectomy  02/17/2011    Procedure: LAPAROSCOPIC CHOLECYSTECTOMY  WITH INTRAOPERATIVE CHOLANGIOGRAM;  Surgeon: Edward Jolly, MD;  Location: [facility];  Service: General;  Laterality: N/A;  . Mohs surgery Right ~ 2014    "side of my scalp"    Family History  Problem Relation Age of Onset  . Crohn's disease Brother   . Breast cancer Mother   . Colon cancer Neg Hx   . Prostate cancer Neg Hx   . Heart attack Father 67  . Dementia Sister   . Diabetes Sister   . Stroke Mother 29   Social History:  reports that he quit smoking about 24 years ago. His smoking use included Cigarettes and Pipe. He has a 40 pack-year smoking history. He has never used smokeless tobacco. He reports that he does not drink alcohol or use illicit drugs.  Allergies: No Known Allergies  Medications:                                                                                                                           No current facility-administered medications for this encounter.   Current Outpatient Prescriptions  Medication Sig Dispense Refill  . amLODipine (NORVASC) 5 MG tablet Take 1 tablet (5 mg total) by mouth daily. 90 tablet 3  . amoxicillin-clavulanate (AUGMENTIN) 875-125 MG tablet Take 1 tablet by mouth 2 (two) times daily. 16 tablet 0  . azelastine (ASTELIN) 0.1 % nasal spray Place 1 spray into both nostrils 2 (two) times daily. Use in each nostril as directed    . busPIRone (BUSPAR) 15 MG tablet Take 1 tablet (15 mg total) by mouth 2 (two) times daily. 180 tablet 0  . clonazePAM (KLONOPIN) 0.5 MG  tablet Take 0.5 tablets (0.25 mg total) by mouth 3 (three) times daily as needed for anxiety. (Patient taking differently: Take 0.125 mg by mouth 3 (three) times daily as needed for anxiety. ) 40 tablet 0  . famotidine (PEPCID) 40 MG tablet Take 1 tablet (40 mg total) by mouth daily. 30 tablet 0  . gemfibrozil (LOPID) 600 MG tablet Take 1 tablet (600 mg total) by mouth 2 (two) times daily. 180 tablet 3  . mesalamine (PENTASA) 250 MG CR capsule Take 2 capsules (500 mg total) by mouth 2 (two) times daily. 360 capsule 3  . Pirfenidone 267 MG CAPS Take 3 capsules by mouth 3 (three) times daily.    Marland Kitchen acetaminophen (TYLENOL) 325 MG tablet Take 650 mg by mouth as needed for pain.    . calcium citrate (CALCITRATE - DOSED IN MG ELEMENTAL CALCIUM) 950 MG tablet Take 200 mg of elemental calcium by mouth daily. Pt take QOD    . Cyanocobalamin (NASCOBAL) 500 MCG/0.1ML SOLN Use one spray nasally per week as directed. (Patient taking differently: Place 0.1 mLs into the nose every 7 (seven) days. Use one spray nasally per week as directed.) 3 Bottle 2  . guaiFENesin-dextromethorphan (ROBITUSSIN DM) 100-10 MG/5ML syrup Take 5 mLs by mouth every 4 (four) hours as needed for cough.  236 mL 0  . nebivolol (BYSTOLIC) 5 MG tablet Take 1 tablet (5 mg total) by mouth daily. 90 tablet 0  . OXYGEN Inhale into the lungs. 2 liters    . predniSONE (DELTASONE) 20 MG tablet Take 2 tablets by mouth for 2 days; then 1 tablet by mouth for 3 days; then half tablet by mouth daily for 3 days and stop prednisone. 10 tablet 0  . psyllium (METAMUCIL SMOOTH TEXTURE) 28 % packet Take 1 packet by mouth daily as needed. For constipation    . Vitamin D, Ergocalciferol, (DRISDOL) 50000 UNITS CAPS capsule Take 50,000 Units by mouth every 7 (seven) days.    Marland Kitchen warfarin (COUMADIN) 2 MG tablet Take 1 tablet (2 mg total) by mouth as directed. (Patient taking differently: Take 3-4 mg by mouth daily. Take 2 tablets on Monday, Wednesday, & Friday Take 1.5  tablets on all other days) 150 tablet 0     ROS:                                                                                                                                       History obtained from unobtainable from patient due to mental status  Physical Examination:                                                                                                      Blood pressure 144/69, pulse 74, temperature 98 F (36.7 C), temperature source Oral, resp. rate 15, weight 65.8 kg (145 lb 1 oz), SpO2 83 %.  HEENT-  Normocephalic, no lesions, without obvious abnormality.  Normal external eye and conjunctiva.  Normal TM's bilaterally.  Normal auditory canals and external ears. Normal external nose, mucus membranes and septum.  Normal pharynx. Cardiovascular- irregularly irregular rhythm, pulses palpable throughout   Lungs- chest clear, no wheezing, rales, normal symmetric air entry Abdomen- normal findings: bowel sounds normal Extremities- no edema Lymph-no adenopathy palpable Musculoskeletal-no joint tenderness, deformity or swelling Skin-warm and dry, no hyperpigmentation, vitiligo, or suspicious lesions  Neurological Examination Mental Status: Alert, bot receptive and expressive aphasia-not able to follow commands due to aphasia. Can mimic at times. Cranial Nerves: II: Discs flat bilaterally; right field cut,  pupils equal, round, reactive to light and accommodation III,IV, VI: ptosis not present, extra-ocular motions intact bilaterally V,VII: smile symmetric, facial light touch sensation normal bilaterally VIII: hearing normal bilaterally IX,X: uvula rises symmetrically XI: bilateral shoulder shrug XII: midline tongue extension Motor: Right : Upper extremity  5/5    Left:     Upper extremity   5/5  Lower extremity   5/5     Lower extremity   5/5 Tone and bulk:normal tone throughout; no atrophy noted Sensory: Pinprick and light touch intact throughout, bilaterally Deep  Tendon Reflexes: 2+ and symmetric throughout Plantars: Right: downgoing   Left: downgoing Cerebellar: No ataxia noted Gait: not tested       Lab Results: Basic Metabolic Panel:  Recent Labs Lab 01/26/15 0950 01/27/15 0331 01/31/15 0909 01/31/15 0917  NA 137 137 140 142  K 3.2* 3.8 4.0 4.0  CL 105 104 107 105  CO2 22 25 25   --   GLUCOSE 143* 112* 96 94  BUN 17 13 17 20   CREATININE 0.99 0.86 0.83 0.80  CALCIUM 9.0 8.4* 9.0  --   MG  --  1.6*  --   --     Liver Function Tests:  Recent Labs Lab 01/31/15 0909  AST 54*  ALT 66*  ALKPHOS 118  BILITOT 0.5  PROT 6.6  ALBUMIN 2.1*   No results for input(s): LIPASE, AMYLASE in the last 168 hours. No results for input(s): AMMONIA in the last 168 hours.  CBC:  Recent Labs Lab 01/26/15 0950 01/27/15 0331 01/31/15 0909 01/31/15 0917  WBC 11.1* 11.2* 14.3*  --   NEUTROABS 9.4*  --  11.8*  --   HGB 13.6 12.2* 14.0 15.0  HCT 39.4 36.5* 40.6 44.0  MCV 94.3 95.3 95.1  --   PLT 212 204 222  --     Cardiac Enzymes: No results for input(s): CKTOTAL, CKMB, CKMBINDEX, TROPONINI in the last 168 hours.  Lipid Panel: No results for input(s): CHOL, TRIG, HDL, CHOLHDL, VLDL, LDLCALC in the last 168 hours.  CBG:  Recent Labs Lab 01/26/15 1216  GLUCAP 110*    Microbiology: Results for orders placed or performed during the hospital encounter of 01/26/15  Culture, blood (routine x 2)     Status: None (Preliminary result)   Collection Time: 01/26/15  1:05 PM  Result Value Ref Range Status   Specimen Description BLOOD RIGHT WRIST  Final   Special Requests IN PEDIATRIC BOTTLE 4CC  Final   Culture NO GROWTH 4 DAYS  Final   Report Status PENDING  Incomplete  Culture, blood (routine x 2)     Status: None (Preliminary result)   Collection Time: 01/26/15  1:09 PM  Result Value Ref Range Status   Specimen Description BLOOD RIGHT HAND  Final   Special Requests IN PEDIATRIC BOTTLE 4CC  Final   Culture NO GROWTH 4 DAYS   Final   Report Status PENDING  Incomplete    Coagulation Studies:  Recent Labs  01/31/15 0909  LABPROT 22.3*  INR 1.97*    Imaging: Ct Head Wo Contrast  01/31/2015  CLINICAL DATA:  Code stroke. Right-sided weakness with facial droop and slurred speech EXAM: CT HEAD WITHOUT CONTRAST TECHNIQUE: Contiguous axial images were obtained from the base of the skull through the vertex without intravenous contrast. COMPARISON:  None. FINDINGS: Ventricle size normal.  Cerebral volume normal. Mild patchy hypodensity in the cerebral white matter bilaterally, most consistent with ischemia of chronic duration. Negative for acute infarct. Negative for hemorrhage or mass. Mild calcification in the basal ganglia bilaterally. Normal arterial density. Calvarium intact. IMPRESSION: Mild chronic microvascular ischemia.  No acute abnormality. Critical Value/emergent results were called by telephone at the time of interpretation on 01/31/2015 at 9:39 am to Dr. Armida Sans, who verbally  acknowledged these results. Electronically Signed   By: Franchot Gallo M.D.   On: 01/31/2015 09:39       Assessment and plan discussed with with attending physician and they are in agreement.    Etta Quill PA-C [facility] [phone]  01/31/2015, 10:00 AM   Assessment: 81 y.o. male with acute onset of aphasia and right field cut. Likely left cortical infarct involving distal branches left MCA.  tPA not administered due to INR of 1.96 and no large vessel occlusion to warrant IR.   Admit to medicine and complete stroke work up. Coumadin with sub-therapeutic INR. Consider switching to a NOAC if no contraindications. Stroke team will follow up tomorrow.   Stroke Risk Factors -age, atrial fibrillation and hyperlipidemia   Recommend: 1. HgbA1c, fasting lipid panel 2. MRI, of the brain without contrast 3. PT consult, OT consult, Speech consult 4. Echocardiogram 6. Prophylactic therapy-consider switching to a NOAC 7.  Risk factor modification 8. Telemetry monitoring 9. Frequent neuro checks 10 NPO until passes stroke swallow screen 11 stroke team to follow  Patient seen and examined together with physician assistant and I concur with the assessment and plan.  Dorian Pod, MD

## 2015-01-31 NOTE — Progress Notes (Signed)
ANTICOAGULATION CONSULT NOTE - Initial Consult  Pharmacy Consult for Eliquis Indication: atrial fibrillation  No Known Allergies  Patient Measurements: Weight: 145 lb 1 oz (65.8 kg)  Vital Signs: Temp: 98 F (36.7 C) (11/09 0928) Temp Source: Oral (11/09 0928) BP: 140/79 mmHg (11/09 1002) Pulse Rate: 89 (11/09 1002)  Labs:  Recent Labs  01/31/15 0909 01/31/15 0917  HGB 14.0 15.0  HCT 40.6 44.0  PLT 222  --   APTT 32  --   LABPROT 22.3*  --   INR 1.97*  --   CREATININE 0.83 0.80    Estimated Creatinine Clearance: 64 mL/min (by C-G formula based on Cr of 0.8).   Medical History: Past Medical History  Diagnosis Date  . Muscle tremor     saw neurology remotely elsewhere, was Rx inderal  . Crohn's ileocolitis ([place])   . Osteopenia     per DEXA 11/2008  . Hypertension 11/11    mild  . Hemorrhoids   . Hypertrophy of prostate     w/o UR obst & oth luts  . Peripheral neuropathy (HCC)     h/o neg w/u  . ED (erectile dysfunction)   . Hypertriglyceridemia   . COPD (chronic obstructive pulmonary disease) ([place])     recent dx--no acute problems  . Hiatal hernia 1956    found while in service  . Anxiety   . Atrial fibrillation with RVR ([place])   . Long term (current) use of anticoagulants 02/21/2011  . Serrated adenoma of colon 03/2010  . Renal cyst     bilateral  . Pulmonary fibrosis ([place]) 2015    Rx Esbriet ~ 10-2013 ([name]), Dr. [name]  . On home oxygen therapy     "2L; 20-24h for the past week" (01/26/2015)  . Emphysema lung ([place]) 2016    Dr. [name]  . B12 deficiency anemia   . Idiopathic pulmonary fibrosis ([place]) 2016    Dr. [name]  . Pneumonia ?2015  . CAP (community acquired pneumonia) 01/26/2015  . Osteoarthritis     "maybe in my hands, feet" (01/26/2015)  . Depression   . Squamous cell carcinoma of skin of scalp     tx'd ~ 8yrs; "froze them off"  . Squamous cell carcinoma, face      "froze them off"  . Melanoma of scalp [facility])     Assessment: 81 yo  m presenting to the ED on 11/9 as a code stroke.  No tPA given d/t INR of 1.9. Patient's head CT was negative.  Patient is on warfarin PTA for afib.  Pharmacy is consulted to switch to apixaban.  Patient's INR today is 1.97, so ok to start apixaban this morning. CBC stable.   Goal of Therapy:  Monitor platelets by anticoagulation protocol: Yes   Plan:  Eliquis 5 mg PO BID Monitor weight, renal function, s/s of bleeding  Brettney Ficken L. Nicole Kindred, PharmD Clinical Pharmacy Resident Pager: [phone] 01/31/2015 10:49 AM

## 2015-01-31 NOTE — Progress Notes (Signed)
Patient arrived to 5M11. Patient is alert, oriented to room, unit, staff. Tele set up, slp aware of patient's order for bedside swallow eval, no skin issues, family and patient updated on plan of care. Safety measures in place, will continue to monitor closely.

## 2015-01-31 NOTE — Progress Notes (Signed)
ANTIBIOTIC CONSULT NOTE - INITIAL  Pharmacy Consult for Vancomycin and Zosyn Indication: pneumonia  No Known Allergies  Patient Measurements: Weight: 145 lb 1 oz (65.8 kg)  Labs:  Recent Labs  01/31/15 0909 01/31/15 0917  WBC 14.3*  --   HGB 14.0 15.0  PLT 222  --   CREATININE 0.83 0.80   Estimated Creatinine Clearance: 64 mL/min (by C-G formula based on Cr of 0.8). No results for input(s): VANCOTROUGH, VANCOPEAK, VANCORANDOM, GENTTROUGH, GENTPEAK, GENTRANDOM, TOBRATROUGH, TOBRAPEAK, TOBRARND, AMIKACINPEAK, AMIKACINTROU, AMIKACIN in the last 72 hours.   Microbiology: Recent Results (from the past 720 hour(s))  Culture, blood (routine x 2)     Status: None   Collection Time: 01/26/15  1:05 PM  Result Value Ref Range Status   Specimen Description BLOOD RIGHT WRIST  Final   Special Requests IN PEDIATRIC BOTTLE 4CC  Final   Culture NO GROWTH 5 DAYS  Final   Report Status 01/31/2015 FINAL  Final  Culture, blood (routine x 2)     Status: None   Collection Time: 01/26/15  1:09 PM  Result Value Ref Range Status   Specimen Description BLOOD RIGHT HAND  Final   Special Requests IN PEDIATRIC BOTTLE 4CC  Final   Culture NO GROWTH 5 DAYS  Final   Report Status 01/31/2015 FINAL  Final    Medical History: Past Medical History  Diagnosis Date  . Muscle tremor     saw neurology remotely elsewhere, was Rx inderal  . Crohn's ileocolitis ([place])   . Osteopenia     per DEXA 11/2008  . Hypertension 11/11    mild  . Hemorrhoids   . Hypertrophy of prostate     w/o UR obst & oth luts  . Peripheral neuropathy (HCC)     h/o neg w/u  . ED (erectile dysfunction)   . Hypertriglyceridemia   . COPD (chronic obstructive pulmonary disease) ([place])     recent dx--no acute problems  . Hiatal hernia 1956    found while in service  . Anxiety   . Atrial fibrillation with RVR ([place])   . Long term (current) use of anticoagulants 02/21/2011  . Serrated adenoma of colon 03/2010  . Renal cyst    bilateral  . Pulmonary fibrosis ([place]) 2015    Rx Esbriet ~ 10-2013 ([name]), Dr. [name]  . On home oxygen therapy     "2L; 20-24h for the past week" (01/26/2015)  . Emphysema lung ([place]) 2016    Dr. [name]  . B12 deficiency anemia   . Idiopathic pulmonary fibrosis ([place]) 2016    Dr. [name]  . Pneumonia ?2015  . CAP (community acquired pneumonia) 01/26/2015  . Osteoarthritis     "maybe in my hands, feet" (01/26/2015)  . Depression   . Squamous cell carcinoma of skin of scalp     tx'd ~ 38yrs; "froze them off"  . Squamous cell carcinoma, face      "froze them off"  . Melanoma of scalp [facility])      Assessment: 81 year old male to begin Vancomycin and Zosyn for pneumonia  Goal of Therapy:  Vancomycin trough level 15-20 mcg/ml  Plan:  Zosyn 3.375 grams iv Q 8 hours - 4 hr infusion Vancomycin 750 mg iv Q 12 hour Follow up Scr, cultures, progress, fever trend  Thank you Anette Guarneri, PharmD [phone]  Tad Moore 01/31/2015,5:35 PM

## 2015-01-31 NOTE — ED Notes (Signed)
Pt arrives from home via [facility] as code stroke.  EMS reports pt's spouse reports pt at baseline this am at 0800.  Pt met at bridge by stroke team

## 2015-01-31 NOTE — Code Documentation (Signed)
81yo male arriving to [facility] via [place] at [phone].  EMS reports that the patient woke up at 0800 at his baseline and went to the bathroom.  Patient's wife later noticed that her husband was not himself and called EMS.  EMS assessed patient to have a right facial droop and right sided weakness and activated a code stroke.  Right sided weakness improved en route.  Stroke team at the bedside on patient arrival.  Labs drawn and patient cleared for CT by Dr. Thomasene Lot.  Patient to CT with team.  NIHSS 10, see documentation for details and code stroke times.  Patient with global aphasia on exam, slight right facial droop, and right hemianopsia.  Patient with h/o atrial fibrillation on Coumadin and reportedly taking as prescribed.  INR 1.97 on arrival.  Patient is contraindicated for tPA.  Patient to CTA per Dr. Armida Sans.  Patient monitored during exam and tolerated well and back to the ED.  Patient is not a candidate for endovascular intervention per Dr. Armida Sans.  Bedside handoff with ED RN Udell.

## 2015-01-31 NOTE — H&P (Addendum)
[facility] History and Physical  Brian [name] PPJ:[id number] DOB: May 15, 1928 DOA: 01/31/2015  Referring physician: Newman [name] PCP: [name], MD  Chief Complaint: Stroke Symptoms  HPI: Brian [name] is a 81 y.o. male with pulmonary fibrosis, atrial fibrillation and chron's. He is on chronic coumadin. He presents to Ed today with stroke like symptoms.  Family reports pt began having difficulty speaking this am around 8:00 am.  Wife noticed pt's right side was weak. She reports he was unable to speak.  Pt was normal this am and sitting in his chair when symptoms began.  Pt has showed some improvement in the Emergency department.  He has improved movement of right arm.  Pt was initially aphasic but is currently talking.  He is able to tell me who is wife is.  Most speech is garbled.  Pt did not fall.  Pt had no loss of consciousness.  No injuries.  Pt was recently discharged from the hospital after being admitted with pneumonia.  Pt is currently on Augmentin. Pt has a past history of atrial fib  And is on coumadin.   Pt is currently on 02 2 liters at home.  Pt is currently on 5 liters in ED currently.  Symptoms have improved, nothing seem to make it worse,   Review of Systems:  Constitutional:  No weight loss, night sweats, Fevers, chills, fatigue.  HEENT:  No headaches,  Difficulty swallowing  No sneezing, itching, ear ache, nasal congestion, post nasal drip,  Cardio-vascular:  No chest pain, Orthopnea, PND, swelling in lower extremities, anasarca, dizziness, palpitations  GI:  No heartburn, indigestion, abdominal pain, nausea, vomiting, diarrhea, change in bowel habits, loss of appetite  Resp: some difficulty breathing Skin:  no rash or lesions.  GU:  no dysuria, change in color of urine, no urgency or frequency. No flank pain.  Musculoskeletal:  No joint pain or swelling. No decreased range of motion. No back pain.  Psych:  Unable  Neuro: difficulty speaking Past Medical  History  Diagnosis Date  . Muscle tremor     saw neurology remotely elsewhere, was Rx inderal  . Crohn's ileocolitis ([place])   . Osteopenia     per DEXA 11/2008  . Hypertension 11/11    mild  . Hemorrhoids   . Hypertrophy of prostate     w/o UR obst & oth luts  . Peripheral neuropathy (HCC)     h/o neg w/u  . ED (erectile dysfunction)   . Hypertriglyceridemia   . COPD (chronic obstructive pulmonary disease) ([place])     recent dx--no acute problems  . Hiatal hernia 1956    found while in service  . Anxiety   . Atrial fibrillation with RVR ([place])   . Long term (current) use of anticoagulants 02/21/2011  . Serrated adenoma of colon 03/2010  . Renal cyst     bilateral  . Pulmonary fibrosis ([place]) 2015    Rx Esbriet ~ 10-2013 ([name]), Dr. [name]  . On home oxygen therapy     "2L; 20-24h for the past week" (01/26/2015)  . Emphysema lung ([place]) 2016    Dr. [name]  . B12 deficiency anemia   . Idiopathic pulmonary fibrosis ([place]) 2016    Dr. [name]  . Pneumonia ?2015  . CAP (community acquired pneumonia) 01/26/2015  . Osteoarthritis     "maybe in my hands, feet" (01/26/2015)  . Depression   . Squamous cell carcinoma of skin of scalp     tx'd ~ 73yrs; "  froze them off"  . Squamous cell carcinoma, face      "froze them off"  . Melanoma of scalp [facility])    Past Surgical History  Procedure Laterality Date  . Colon resection  1984    resection of distal ileum and cecum w/ appendectomy, 18-inch small intestine, for Crohn's disease  . Colon surgery    . Appendectomy  1984  . Cataract extraction w/ intraocular lens  implant, bilateral Bilateral 2007  . Cholecystectomy  02/17/2011    Procedure: LAPAROSCOPIC CHOLECYSTECTOMY WITH INTRAOPERATIVE CHOLANGIOGRAM;  Surgeon: Edward Jolly, MD;  Location: [facility] ORS;  Service: General;  Laterality: N/A;  . Mohs surgery Right ~ 2014    "side of my scalp"   Social History:  reports that he quit smoking about 24 years ago. His smoking use included  Cigarettes and Pipe. He has a 40 pack-year smoking history. He has never used smokeless tobacco. He reports that he does not drink alcohol or use illicit drugs.  No Known Allergies  Family History  Problem Relation Age of Onset  . Crohn's disease Brother   . Breast cancer Mother   . Colon cancer Neg Hx   . Prostate cancer Neg Hx   . Heart attack Father 35  . Dementia Sister   . Diabetes Sister   . Stroke Mother 62     Prior to Admission medications   Medication Sig Start Date End Date Taking? Authorizing Provider  acetaminophen (TYLENOL) 325 MG tablet Take 650 mg by mouth as needed for pain.   Yes Historical Provider, MD  amLODipine (NORVASC) 5 MG tablet Take 1 tablet (5 mg total) by mouth daily. 07/25/14  Yes Colon Branch, MD  amoxicillin-clavulanate (AUGMENTIN) 875-125 MG tablet Take 1 tablet by mouth 2 (two) times daily. 01/27/15  Yes Barton Dubois, MD  azelastine (ASTELIN) 0.1 % nasal spray Place 1 spray into both nostrils 2 (two) times daily as needed for allergies. Use in each nostril as directed   Yes Historical Provider, MD  busPIRone (BUSPAR) 15 MG tablet Take 1 tablet (15 mg total) by mouth 2 (two) times daily. 11/22/14  Yes Colon Branch, MD  calcium citrate (CALCITRATE - DOSED IN MG ELEMENTAL CALCIUM) 950 MG tablet Take 200 mg of elemental calcium by mouth daily. Pt take QOD   Yes Historical Provider, MD  clonazePAM (KLONOPIN) 0.5 MG tablet Take 0.5 tablets (0.25 mg total) by mouth 3 (three) times daily as needed for anxiety. Patient taking differently: Take 0.125 mg by mouth 3 (three) times daily as needed for anxiety.  01/24/15  Yes Colon Branch, MD  Cyanocobalamin (NASCOBAL) 500 MCG/0.1ML SOLN Use one spray nasally per week as directed. Patient taking differently: Place 0.1 mLs into the nose every 7 (seven) days. Use one spray nasally per week as directed. 12/14/14  Yes Colon Branch, MD  famotidine (PEPCID) 40 MG tablet Take 1 tablet (40 mg total) by mouth daily. 01/27/15  Yes Barton Dubois, MD  gemfibrozil (LOPID) 600 MG tablet Take 1 tablet (600 mg total) by mouth 2 (two) times daily. 07/25/14  Yes Colon Branch, MD  mesalamine (PENTASA) 250 MG CR capsule Take 2 capsules (500 mg total) by mouth 2 (two) times daily. 12/12/14  Yes Ladene Artist, MD  nebivolol (BYSTOLIC) 5 MG tablet Take 1 tablet (5 mg total) by mouth daily. 12/26/14  Yes Larey Dresser, MD  Pirfenidone 267 MG CAPS Take 3 capsules by mouth 3 (three) times daily.  Yes Historical Provider, MD  predniSONE (DELTASONE) 20 MG tablet Take 2 tablets by mouth for 2 days; then 1 tablet by mouth for 3 days; then half tablet by mouth daily for 3 days and stop prednisone. 01/27/15  Yes Barton Dubois, MD  Vitamin D, Ergocalciferol, (DRISDOL) 50000 UNITS CAPS capsule Take 50,000 Units by mouth every 7 (seven) days.   Yes Historical Provider, MD  warfarin (COUMADIN) 2 MG tablet Take 1 tablet (2 mg total) by mouth as directed. Patient taking differently: Take 3-4 mg by mouth daily. Take 2 tablets on Monday, Wednesday, & Friday Take 1.5 tablets on all other days 10/04/14  Yes Larey Dresser, MD  guaiFENesin-dextromethorphan [facility] DM) 100-10 MG/5ML syrup Take 5 mLs by mouth every 4 (four) hours as needed for cough. 01/27/15   Barton Dubois, MD  OXYGEN Inhale into the lungs. 2 liters    Historical Provider, MD  psyllium (METAMUCIL SMOOTH TEXTURE) 28 % packet Take 1 packet by mouth daily as needed. For constipation    Historical Provider, MD   Physical Exam: Filed Vitals:   01/31/15 1002 01/31/15 1015 01/31/15 1045 01/31/15 1100  BP: 140/79 147/74 145/68 146/63  Pulse: 89 95 91 93  Temp:      TempSrc:      Resp: 22 20 22 21   Weight:      SpO2: 91% 82% 94% 95%    Wt Readings from Last 3 Encounters:  01/31/15 65.8 kg (145 lb 1 oz)  01/27/15 63.5 kg (139 lb 15.9 oz)  01/24/15 64.467 kg (142 lb 2 oz)    General:  Appears calm and comfortable Eyes: PERRL, normal lids, irises & conjunctiva ENT: grossly normal hearing,  lips & tongue Neck: no LAD, masses or thyromegaly Cardiovascular: RRR, no m/r/g. No LE edema. Telemetry: SR, no arrhythmias  Respiratory: harsh breath sound, rhonchi.  No wheezing.  02 sats 89% on 5 liters Abdomen: soft, ntnd Skin: no rash or induration seen on limited exam Musculoskeletal: grossly normal tone BUE/BLE Psychiatric: awake and responsive Neurologic:  Speech garbled, able to name wife and find limited words, Cn 2-12 grossly intact. Grips equal,  From all extremities dtr's present and equal.  Pt able to sit up with assistance.           Labs on Admission:  Basic Metabolic Panel:  Recent Labs Lab 01/26/15 0950 01/27/15 0331 01/31/15 0909 01/31/15 0917  NA 137 137 140 142  K 3.2* 3.8 4.0 4.0  CL 105 104 107 105  CO2 22 25 25   --   GLUCOSE 143* 112* 96 94  BUN 17 13 17 20   CREATININE 0.99 0.86 0.83 0.80  CALCIUM 9.0 8.4* 9.0  --   MG  --  1.6*  --   --    Liver Function Tests:  Recent Labs Lab 01/31/15 0909  AST 54*  ALT 66*  ALKPHOS 118  BILITOT 0.5  PROT 6.6  ALBUMIN 2.1*   No results for input(s): LIPASE, AMYLASE in the last 168 hours. No results for input(s): AMMONIA in the last 168 hours. CBC:  Recent Labs Lab 01/26/15 0950 01/27/15 0331 01/31/15 0909 01/31/15 0917  WBC 11.1* 11.2* 14.3*  --   NEUTROABS 9.4*  --  11.8*  --   HGB 13.6 12.2* 14.0 15.0  HCT 39.4 36.5* 40.6 44.0  MCV 94.3 95.3 95.1  --   PLT 212 204 222  --    Cardiac Enzymes: No results for input(s): CKTOTAL, CKMB, CKMBINDEX, TROPONINI in the  last 168 hours.  BNP (last 3 results) No results for input(s): BNP in the last 8760 hours.  ProBNP (last 3 results) No results for input(s): PROBNP in the last 8760 hours.  CBG:  Recent Labs Lab 01/26/15 1216  GLUCAP 110*    Radiological Exams on Admission: Ct Angio Head W/cm &/or Wo Cm  01/31/2015  CLINICAL DATA:  Right facial droop, right-sided weakness, and slurred speech. EXAM: CT ANGIOGRAPHY HEAD AND NECK TECHNIQUE:  Multidetector CT imaging of the head and neck was performed using the standard protocol during bolus administration of intravenous contrast. Multiplanar CT image reconstructions and MIPs were obtained to evaluate the vascular anatomy. Carotid stenosis measurements (when applicable) are obtained utilizing NASCET criteria, using the distal internal carotid diameter as the denominator. CONTRAST:  32mL OMNIPAQUE IOHEXOL 350 MG/ML SOLN COMPARISON:  Noncontrast head CT earlier today. Chest radiographs 01/26/2015. FINDINGS: CTA NECK Aortic arch: 3 vessel aortic arch with mild atherosclerotic plaque. Brachiocephalic and subclavian arteries are patent without significant stenosis. Right carotid system: Patent with minimal non stenotic plaque at the carotid bifurcation. Left carotid system: Patent without stenosis or significant atherosclerosis. Vertebral arteries: Patent without significant stenosis. Left vertebral artery is mildly dominant. Skeleton: Moderate multilevel cervical disc and facet degeneration. Other neck: Extensive consolidation is partially visualized in the right upper lobe as seen on recent chest radiographs, with areas of cavitation. This is superimposed on a background of advanced centrilobular emphysema. There are multiple enlarged upper mediastinal lymph nodes predominantly in the right paratracheal region which measure up to 1.2 cm in short axis. CTA HEAD Anterior circulation: Internal carotid arteries are patent from skullbase to carotid termini with minimal atherosclerotic calcification but no stenosis. There is a 2 mm left posterior communicating artery region infundibulum versus aneurysm. M1 segments are widely patent. MCA bifurcations are patent. There is occlusion of a left M2 inferior division branch vessel approximately 1.5 cm distal to the MCA bifurcation (series 9, image 88 and series 11, image 117). ACAs are patent without significant stenosis. Posterior circulation: Intracranial vertebral  arteries are patent with the left being dominant. Right vertebral artery is markedly hypoplastic distal to the PICA origin. SCA origins are patent. Basilar artery is diffusely small in caliber on a developmental basis without significant superimposed focal stenosis. There is a fetal origin of the right PCA. PCAs are patent without evidence of significant stenosis. Venous sinuses: Patent. Anatomic variants: Fetal origin of the right PCA. IMPRESSION: 1. No large vessel occlusion. 2. Left M2 MCA branch vessel occlusion. 3. No cervical carotid or vertebral artery stenosis. 4. 2 mm left posterior communicating artery infundibulum versus aneurysm. 5. Extensive right upper lobe lung consolidation with areas of cavitation, incompletely visualized. Mild mediastinal lymphadenopathy. These results were called by telephone at the time of interpretation on 01/31/2015 at 10:30 am to [place] , who verbally acknowledged these results. Electronically Signed   By: Logan Bores M.D.   On: 01/31/2015 10:35   Ct Head Wo Contrast  01/31/2015  CLINICAL DATA:  Code stroke. Right-sided weakness with facial droop and slurred speech EXAM: CT HEAD WITHOUT CONTRAST TECHNIQUE: Contiguous axial images were obtained from the base of the skull through the vertex without intravenous contrast. COMPARISON:  None. FINDINGS: Ventricle size normal.  Cerebral volume normal. Mild patchy hypodensity in the cerebral white matter bilaterally, most consistent with ischemia of chronic duration. Negative for acute infarct. Negative for hemorrhage or mass. Mild calcification in the basal ganglia bilaterally. Normal arterial density. Calvarium intact. IMPRESSION: Mild  chronic microvascular ischemia.  No acute abnormality. Critical Value/emergent results were called by telephone at the time of interpretation on 01/31/2015 at 9:39 am to Dr. Armida Sans, who verbally acknowledged these results. Electronically Signed   By: Franchot Gallo M.D.   On: 01/31/2015 09:39    Ct Angio Neck W/cm &/or Wo/cm  01/31/2015  CLINICAL DATA:  Right facial droop, right-sided weakness, and slurred speech. EXAM: CT ANGIOGRAPHY HEAD AND NECK TECHNIQUE: Multidetector CT imaging of the head and neck was performed using the standard protocol during bolus administration of intravenous contrast. Multiplanar CT image reconstructions and MIPs were obtained to evaluate the vascular anatomy. Carotid stenosis measurements (when applicable) are obtained utilizing NASCET criteria, using the distal internal carotid diameter as the denominator. CONTRAST:  45mL OMNIPAQUE IOHEXOL 350 MG/ML SOLN COMPARISON:  Noncontrast head CT earlier today. Chest radiographs 01/26/2015. FINDINGS: CTA NECK Aortic arch: 3 vessel aortic arch with mild atherosclerotic plaque. Brachiocephalic and subclavian arteries are patent without significant stenosis. Right carotid system: Patent with minimal non stenotic plaque at the carotid bifurcation. Left carotid system: Patent without stenosis or significant atherosclerosis. Vertebral arteries: Patent without significant stenosis. Left vertebral artery is mildly dominant. Skeleton: Moderate multilevel cervical disc and facet degeneration. Other neck: Extensive consolidation is partially visualized in the right upper lobe as seen on recent chest radiographs, with areas of cavitation. This is superimposed on a background of advanced centrilobular emphysema. There are multiple enlarged upper mediastinal lymph nodes predominantly in the right paratracheal region which measure up to 1.2 cm in short axis. CTA HEAD Anterior circulation: Internal carotid arteries are patent from skullbase to carotid termini with minimal atherosclerotic calcification but no stenosis. There is a 2 mm left posterior communicating artery region infundibulum versus aneurysm. M1 segments are widely patent. MCA bifurcations are patent. There is occlusion of a left M2 inferior division branch vessel approximately 1.5  cm distal to the MCA bifurcation (series 9, image 88 and series 11, image 117). ACAs are patent without significant stenosis. Posterior circulation: Intracranial vertebral arteries are patent with the left being dominant. Right vertebral artery is markedly hypoplastic distal to the PICA origin. SCA origins are patent. Basilar artery is diffusely small in caliber on a developmental basis without significant superimposed focal stenosis. There is a fetal origin of the right PCA. PCAs are patent without evidence of significant stenosis. Venous sinuses: Patent. Anatomic variants: Fetal origin of the right PCA. IMPRESSION: 1. No large vessel occlusion. 2. Left M2 MCA branch vessel occlusion. 3. No cervical carotid or vertebral artery stenosis. 4. 2 mm left posterior communicating artery infundibulum versus aneurysm. 5. Extensive right upper lobe lung consolidation with areas of cavitation, incompletely visualized. Mild mediastinal lymphadenopathy. These results were called by telephone at the time of interpretation on 01/31/2015 at 10:30 am to [place] , who verbally acknowledged these results. Electronically Signed   By: Logan Bores M.D.   On: 01/31/2015 10:35    EKG: Independently reviewed.  afib at 74  No ischemia,  No acute changes  Assessment/Plan Active Problems:   HYPERTRIGLYCERIDEMIA   Essential hypertension   CROHN'S DISEASE-LARGE & SMALL INTESTINE   RECTAL BLEEDING   Dyspnea   Atrial fibrillation with controlled ventricular response (HCC)   Long term (current) use of anticoagulants   Stroke [facility])   CVA (cerebral infarction)  CVA most likely secondary to atrial fibrillation, Pt is on coumadin, Neurology following and recommends changing to [place].  Pt has MRI, echo ,carotids, serologys, pt and ot pending  Atrial Fibrillation rate is stable on full dose anticoagulation, holding betablocker to allow permissive hypertension during acute stroke.  Pt has a ChadDS2-Vasc of 5points stroke risk is  7.2  HCAP: worsening on outpt Augmentin. Possible RLL consolidation ? Aspiration.  - DC augmentin - start cefepime and vanc  Pulmonary Fibrosis:  continue current treatment but consider DC as outpt if persistent pneumonia  Chron's Ileocolitis _ - continue home treatment  Hypertension:  Holding beta blocker and amlodipine  to allow permissive hypertension - hydralazine PRN   Code Status: Full Code pt's family wants DVT Prophylaxis:He is anticoagulated with coumadin Family Communication: Pt is here with his family.  His [name] is at the bedside Disposition Plan: Length of stay to be determined based on further evaluation  Time spent:1 hour  [facility] Pager [phone]  [facility] [place])     Attending MD note  Patient was seen, examined,treatment plan was discussed with the  Advance Practice Provider.  I have personally reviewed the clinical findings, lab,EKG, imaging studies and management of this patient in detail.I have also reviewed the orders written for this patient which were under my direction. I agree with the documentation, as recorded by the Advance Practice Provider.    Note above with changes made to the assessment and plan is appropriate    MERRELL, DAVID Lenna Sciara, MD Family Medicine See Amion for pager # [facility]

## 2015-01-31 NOTE — ED Notes (Signed)
Dr. MacKuen at bedside. 

## 2015-01-31 NOTE — Discharge Instructions (Signed)

## 2015-02-01 ENCOUNTER — Encounter (HOSPITAL_COMMUNITY): Payer: Self-pay

## 2015-02-01 ENCOUNTER — Observation Stay (HOSPITAL_COMMUNITY): Payer: Medicare Other

## 2015-02-01 ENCOUNTER — Ambulatory Visit: Payer: Medicare Other | Admitting: Internal Medicine

## 2015-02-01 DIAGNOSIS — J8410 Pulmonary fibrosis, unspecified: Secondary | ICD-10-CM | POA: Diagnosis present

## 2015-02-01 DIAGNOSIS — I638 Other cerebral infarction: Secondary | ICD-10-CM

## 2015-02-01 DIAGNOSIS — I10 Essential (primary) hypertension: Secondary | ICD-10-CM

## 2015-02-01 DIAGNOSIS — I4891 Unspecified atrial fibrillation: Secondary | ICD-10-CM | POA: Diagnosis not present

## 2015-02-01 DIAGNOSIS — H9190 Unspecified hearing loss, unspecified ear: Secondary | ICD-10-CM | POA: Diagnosis present

## 2015-02-01 DIAGNOSIS — Z9981 Dependence on supplemental oxygen: Secondary | ICD-10-CM | POA: Diagnosis not present

## 2015-02-01 DIAGNOSIS — I63512 Cerebral infarction due to unspecified occlusion or stenosis of left middle cerebral artery: Secondary | ICD-10-CM | POA: Diagnosis not present

## 2015-02-01 DIAGNOSIS — Z85828 Personal history of other malignant neoplasm of skin: Secondary | ICD-10-CM | POA: Diagnosis not present

## 2015-02-01 DIAGNOSIS — R29810 Facial weakness: Secondary | ICD-10-CM | POA: Diagnosis present

## 2015-02-01 DIAGNOSIS — I63411 Cerebral infarction due to embolism of right middle cerebral artery: Secondary | ICD-10-CM

## 2015-02-01 DIAGNOSIS — J189 Pneumonia, unspecified organism: Secondary | ICD-10-CM | POA: Diagnosis not present

## 2015-02-01 DIAGNOSIS — Z85820 Personal history of malignant melanoma of skin: Secondary | ICD-10-CM | POA: Diagnosis not present

## 2015-02-01 DIAGNOSIS — J159 Unspecified bacterial pneumonia: Secondary | ICD-10-CM | POA: Diagnosis not present

## 2015-02-01 DIAGNOSIS — Z87891 Personal history of nicotine dependence: Secondary | ICD-10-CM | POA: Diagnosis not present

## 2015-02-01 DIAGNOSIS — Z7901 Long term (current) use of anticoagulants: Secondary | ICD-10-CM | POA: Diagnosis not present

## 2015-02-01 DIAGNOSIS — R29710 NIHSS score 10: Secondary | ICD-10-CM | POA: Diagnosis present

## 2015-02-01 DIAGNOSIS — Z4682 Encounter for fitting and adjustment of non-vascular catheter: Secondary | ICD-10-CM | POA: Diagnosis not present

## 2015-02-01 DIAGNOSIS — F329 Major depressive disorder, single episode, unspecified: Secondary | ICD-10-CM | POA: Diagnosis present

## 2015-02-01 DIAGNOSIS — R1314 Dysphagia, pharyngoesophageal phase: Secondary | ICD-10-CM | POA: Diagnosis not present

## 2015-02-01 DIAGNOSIS — M8580 Other specified disorders of bone density and structure, unspecified site: Secondary | ICD-10-CM | POA: Diagnosis present

## 2015-02-01 DIAGNOSIS — R1310 Dysphagia, unspecified: Secondary | ICD-10-CM | POA: Diagnosis not present

## 2015-02-01 DIAGNOSIS — K5080 Crohn's disease of both small and large intestine without complications: Secondary | ICD-10-CM | POA: Diagnosis present

## 2015-02-01 DIAGNOSIS — R471 Dysarthria and anarthria: Secondary | ICD-10-CM | POA: Diagnosis present

## 2015-02-01 DIAGNOSIS — R4701 Aphasia: Secondary | ICD-10-CM | POA: Diagnosis not present

## 2015-02-01 DIAGNOSIS — R4702 Dysphasia: Secondary | ICD-10-CM | POA: Diagnosis present

## 2015-02-01 DIAGNOSIS — I63412 Cerebral infarction due to embolism of left middle cerebral artery: Secondary | ICD-10-CM | POA: Diagnosis present

## 2015-02-01 DIAGNOSIS — E781 Pure hyperglyceridemia: Secondary | ICD-10-CM | POA: Diagnosis present

## 2015-02-01 DIAGNOSIS — I69920 Aphasia following unspecified cerebrovascular disease: Secondary | ICD-10-CM | POA: Diagnosis not present

## 2015-02-01 LAB — LIPID PANEL
Cholesterol: 103 mg/dL (ref 0–200)
HDL: 33 mg/dL — ABNORMAL LOW (ref >40)
LDL Cholesterol: 56 mg/dL (ref 0–99)
Total CHOL/HDL Ratio: 3.1 RATIO
Triglycerides: 72 mg/dL (ref <150)
VLDL: 14 mg/dL (ref 0–40)

## 2015-02-01 LAB — HIV ANTIBODY (ROUTINE TESTING W REFLEX): HIV SCREEN 4TH GENERATION: NONREACTIVE

## 2015-02-01 MED ORDER — PRAVASTATIN SODIUM 20 MG PO TABS
10.0000 mg | ORAL_TABLET | Freq: Every day | ORAL | Status: DC
  Administered 2015-02-01: 10 mg via ORAL
  Filled 2015-02-01 (×2): qty 1

## 2015-02-01 NOTE — Progress Notes (Signed)
STROKE TEAM PROGRESS NOTE   HISTORY Brian [name] is an 81 y.o. male with known Afib and INR 1.9 who presented to ED as code stroke after wife noted sudden change in speech at 0800 (01/31/2015 0800). EMS was called and patient noted to be flaccid on the right arm, right facial droop and aphasic. While in ED right arm improved but patient remained aphasic with right field cut. CT head was negative and CTA brain neck and brain showed no large vessel occlusion.  NIHSS: 10. Patient was not administered TPA secondary to INR 1.9. He was admitted for further evaluation and treatment.   SUBJECTIVE (INTERVAL HISTORY) His wife and granddaughter Aeronautical engineer at [place])  are at the bedside.  Overall he feels his condition is improved, no motor deficit but still has receptive aphasia. Wedding anniversary tomorrow - 61 years   OBJECTIVE Temp:  [97.5 F (36.4 C)-98.7 F (37.1 C)] 98.3 F (36.8 C) (11/10 0450) Pulse Rate:  [90-93] 90 (11/10 0450) Cardiac Rhythm:  [-] Atrial fibrillation;Atrial flutter (11/10 0823) Resp:  [16-21] 16 (11/10 0450) BP: (112-140)/(62-76) 135/75 mmHg (11/10 0450) SpO2:  [92 %-95 %] 94 % (11/10 0450)  CBC:  Recent Labs Lab 01/26/15 0950 01/27/15 0331 01/31/15 0909 01/31/15 0917  WBC 11.1* 11.2* 14.3*  --   NEUTROABS 9.4*  --  11.8*  --   HGB 13.6 12.2* 14.0 15.0  HCT 39.4 36.5* 40.6 44.0  MCV 94.3 95.3 95.1  --   PLT 212 204 222  --     Basic Metabolic Panel:  Recent Labs Lab 01/27/15 0331 01/31/15 0909 01/31/15 0917  NA 137 140 142  K 3.8 4.0 4.0  CL 104 107 105  CO2 25 25  --   GLUCOSE 112* 96 94  BUN 13 17 20   CREATININE 0.86 0.83 0.80  CALCIUM 8.4* 9.0  --   MG 1.6*  --   --     Lipid Panel:     Component Value Date/Time   CHOL 103 02/01/2015 1050   TRIG 72 02/01/2015 1050   HDL 33* 02/01/2015 1050   CHOLHDL 3.1 02/01/2015 1050   VLDL 14 02/01/2015 1050   LDLCALC 56 02/01/2015 1050   HgbA1c:  Lab Results  Component Value Date   HGBA1C 5.9  01/07/2013   Urine Drug Screen: No results found for: LABOPIA, COCAINSCRNUR, LABBENZ, AMPHETMU, THCU, LABBARB    IMAGING I have personally reviewed the radiological images below and agree with the radiology interpretations.                 Ct Head Wo Contrast 01/31/2015   Mild chronic microvascular ischemia.  No acute abnormality.   Ct Angio Head & Neck W/cm &/or Wo/cm 01/31/2015   1. No large vessel occlusion. 2. Left M2 MCA branch vessel occlusion. 3. No cervical carotid or vertebral artery stenosis. 4. 2 mm left posterior communicating artery infundibulum versus aneurysm. 5. Extensive right upper lobe lung consolidation with areas of cavitation, incompletely visualized. Mild mediastinal lymphadenopathy.   MRI & MRA Brain Wo Contrast 01/31/2015  1. Acute nonhemorrhagic posterior left MCA territory infarct is confirmed. 2. Occlusion of distal left posterior MCA branch vessel. There is opacification of an additional left MCA branch vessels since the earlier CTA study. 3. Extensive white matter disease is present in addition to the acute infarct.   Dg Chest Port 1 View 01/31/2015   Slight worsening of RIGHT upper lobe pneumonia superimposed on emphysematous change.   2D Echocardiogram  -  Left ventricle: The cavity size was normal. Wall thickness wasincreased in a pattern of mild LVH. Systolic function was normal.The estimated ejection fraction was in the range of 55% to 60%.Regional wall motion abnormalities cannot be excluded. - Mitral valve: Calcified annulus. There was mild regurgitation. - Right atrium: The atrium was mildly dilated. - Pulmonary arteries: PA peak pressure: 34 mm Hg (S).   PHYSICAL EXAM  Temp:  [97.7 F (36.5 C)-98.6 F (37 C)] 97.7 F (36.5 C) (11/10 1540) Pulse Rate:  [89-91] 89 (11/10 1840) Resp:  [16-18] 16 (11/10 1840) BP: (105-135)/(51-75) 121/56 mmHg (11/10 1840) SpO2:  [91 %-94 %] 91 % (11/10 1840)  General - Well nourished, well developed, in no  apparent distress.  Ophthalmologic - Fundi not visualized due to noncooperation.  Cardiovascular - irregularly irregular heart rate and rhythm.  Mental Status -  Awake alert but not able to answer orientation questions due to aphasia. Mainly wernicke's aphasia, word salad, impaired naming and repetition. Not able to follow simple commands.  Cranial Nerves II - XII - II - blinking to visual threat bilaterally. III, IV, VI - Extraocular movements intact. V - Facial sensation intact bilaterally. VII - Facial movement intact bilaterally. VIII - Hearing & vestibular intact bilaterally. X - Palate elevates symmetrically. XI - Chin turning & shoulder shrug intact bilaterally. XII - Tongue protrusion intact.  Motor Strength - The patient's strength was normal in all extremities and pronator drift was absent.  Bulk was normal and fasciculations were absent.   Motor Tone - Muscle tone was assessed at the neck and appendages and was normal.  Reflexes - The patient's reflexes were 1+ in all extremities and he had no pathological reflexes.  Sensory - not cooperative.    Coordination - not cooperative but no gross ataxia.  Tremor was absent.  Gait and Station - deferred.   ASSESSMENT/PLAN Mr. [name] is a 81 y.o. male with history of pulmonary fibrosis, atrial fibrillation on coumadin and crohn's disease presenting with right sided weakness and difficulty speaking. He did not receive IV t-PA due to elevated INR 1.9 on coumadin.   Stroke:  Dominant left MCA infarct embolic secondary to known atrial fibrillation   Resultant receptive aphasia  MRI  L MCA infarct. extensive white matter infarct  MRA  Posterior distal L MCA branch occlusion   CTA head and neck Left M2 MCA branch occlusion.  2 mm left P Com infundibulum versus aneurysm. Extensive right upper lobe lung consolidation with areas of cavitation.  Mild mediastinal lymphadenopathy.  2D Echo  EF 55-60%   LDL 86  HgbA1c  pending   eliquis for VTE prophylaxis  DIET DYS 2 Room service appropriate?: Yes; Fluid consistency:: Nectar Thick  warfarin daily prior to admission, changed to  Eliquis (apixaban) daily. Continue eliquis for stroke prevention on discharge  Patient counseled to be compliant with his antithrombotic medications  Ongoing aggressive stroke risk factor management  Therapy recommendations:  ST;  PT and OT pending   Disposition:  pending   Atrial Fibrillation  Home anticoagulation:  warfarin   INR 1.97 on admission  Changed to eliquis in hospital  Continue eliquis at discharge    Hypertension  Stable  Permissive hypertension (OK if < 180/105) but gradually normalize in 5-7 days. Lower range due to on anticoagulation  Hyperlipidemia  Home meds:  No statin, on lopid  LDL 86, goal < 70  Add low dose statin - pravachol 10  Continue statin at discharge  Other  Stroke Risk Factors  Advanced age  Former Cigarette smoker, quit smoking 24 years ago   Family hx stroke (mother)  Other Active Problems  Chron's disease large and small bowel w/ ileocolitis   Rectal bleeding  Dyspnea, on home O2  HCAP, on cefepime and [facility] day # 1  Neurology will sign off. Please call with questions. Pt will follow up with Dr. Erlinda Hong at [facility] in about 2 months. Thanks for the consult.  Rosalin Hawking, MD PhD Stroke Neurology 02/01/2015 7:27 PM    To contact Stroke Continuity provider, please refer to [email or web address] After hours, contact General Neurology

## 2015-02-01 NOTE — Progress Notes (Signed)
Patient's family stated that patient has episodes of "staring into space with eyes open." Patient's family states that patient answers once spoken to during episode. MD made aware.

## 2015-02-01 NOTE — Progress Notes (Signed)
Initial Nutrition Assessment  DOCUMENTATION CODES:   Non-severe (moderate) malnutrition in context of chronic illness  INTERVENTION:  Ensure Enlive po BID, each supplement provides 350 kcal and 20 grams of protein Diet advancement per MD/SLP   NUTRITION DIAGNOSIS:   Malnutrition related to poor appetite, chronic illness as evidenced by per patient/family report, severe depletion of muscle mass, moderate depletion of body fat.  GOAL:   Patient will meet greater than or equal to 90% of their needs  MONITOR:   PO intake, I & O's, Supplement acceptance, Labs  REASON FOR ASSESSMENT:   Malnutrition Screening Tool    ASSESSMENT:   Brian [name] is a 81 y.o. male with pulmonary fibrosis, atrial fibrillation and chron's. He is on chronic coumadin. He presents to Ed today with stroke like symptoms. PMH of HTN, HLD, COPD.  Spoke with Mr. Weigman with family at bedside. Most history is obtained from Wife, pt is somewhat confused, garbled language.  Wife reported 25# wt loss, did not designate in what timespan.Called patient started at about 170# prior to multiple hospitalizations.  Wife reported prior to previous hospitalization pt was on ABx and had no appetite. Prior to admission, she said pt was eating everything he saw.  His appetite is fluctuating with his health status, currently has pneumonia. Wife reported some contribution from pulmonary fibrosis, and shortness of breath, to poor appetite.  Pt drinks chocolate ensure at home, will provide during stay.  Wife reported pt just passed swallow study with SLP. Currently on NDD2, Nectar thick liquids..will monitor for advancement.  Nutrition-Focused physical exam completed. Findings are moderate fat depletion, severe muscle depletion, and no edema.      Diet Order:  DIET DYS 2 Room service appropriate?: Yes; Fluid consistency:: Nectar Thick  Skin:  Reviewed, no issues  Last BM:  01/31/2015  Height:   Ht Readings from Last  1 Encounters:  01/26/15 5' 11.75" (1.822 m)    Weight:   Wt Readings from Last 1 Encounters:  01/31/15 145 lb 1 oz (65.8 kg)    Ideal Body Weight:  81 kg  BMI:  Body mass index is 19.82 kg/(m^2).  Estimated Nutritional Needs:   Kcal:  2000-2400 calories  Protein:  66-80 grams  Fluid:  >/= 2L  EDUCATION NEEDS:   No education needs identified at this time  Brian [name]. Brian Franqui, MS, RD LDN After Hours/Weekend Pager [phone]

## 2015-02-01 NOTE — Care Management Note (Signed)
Case Management Note  Patient Details  Name: Brian [name] MRN: [id number] Date of Birth: [birth year]  Subjective/Objective:                    Action/Plan: Patient admitted with CVA. Patient lives at home with his spouse and uses home oxygen. Awaiting PT/OT recommendations for discharge disposition. CM will continue to follow for discharge needs.   Expected Discharge Date:                  Expected Discharge Plan:  [place]  In-House Referral:     Discharge planning Services     Post Acute Care Choice:    Choice offered to:     DME Arranged:    DME Agency:     HH Arranged:    [place] Agency:     Status of Service:  In process, will continue to follow  Medicare Important Message Given:    Date Medicare IM Given:    Medicare IM give by:    Date Additional Medicare IM Given:    Additional Medicare Important Message give by:     If discussed at [place] of Stay Meetings, dates discussed:    Additional Comments:  Pollie Friar, RN 02/01/2015, 12:03 PM

## 2015-02-01 NOTE — Progress Notes (Signed)
Rehab Admissions Coordinator Note:  Patient was screened by Retta Diones for appropriateness for an Inpatient Acute Rehab Consult.  At this time, we are recommending Inpatient Rehab consult.  Retta Diones 02/01/2015, 3:47 PM  I can be reached at [phone].

## 2015-02-01 NOTE — Progress Notes (Signed)
[facility] PROGRESS NOTE  JEMARIO SCHWALBACH [id number] DOB: [birth year] DOA: 01/31/2015 PCP: [name], MD  Assessment/Plan: 1. Acute CVA. -Brian Brian [name] is a pleasant 81 year old gentleman presenting with dysarthria, MRI of brain revealed a nonhemorrhagic posterior left MCA territory infarct. -He has a history of atrial fibrillation and had been anticoagulated with warfarin. Initial labs showed INR of 1.9. -Case was discussed with Dr. Erlinda Hong of neurology recommending that his anticoagulation be changed from warfarin to Eliquis 5 mg by mouth twice a day. -Will continue statin therapy with Pravachol -Transthoracic echocardiogram performed on 01/31/2015 showing EF of 55-60% -Physical therapy/occupational therapy consulted  2. Possible healthcare associated pneumonia -A chest x-ray on admission showing slightly worse in the right upper lobe pneumonia superimposed on emphysematous changes. He was started on empiric antibiotic therapy with Augmentin in the outpatient setting. -He has remained afebrile overnight and hemodynamically stable. On my exam he is nontoxic appearing. Will watch him one more day on IV antibiotic therapy with vancomycin and Zosyn. -Repeat chest x-ray in a.m.  3.  Pulmonary fibrosis. -During this hospitalization he has had an increase in oxygen requirement which could be related to worsening pneumonia. He is on broad-spectrum IV antibiotic therapy.  4.  Atrial fibrillation. -Beta blocker was held to allow for permissive hypertension given presentation of stroke. -Ventricular rates remained stable in the 90s. -As mentioned above anticoagulation was changed to Eliquis.   5.  Dyslipidemia -Continue statin therapy  Code Status: Full code Family Communication: I spoke to his son and wife were present at bedside Disposition Plan:    Consultants:  Neurology  Antibiotics:  Zosyn  Vancomycin  HPI/Subjective: Brian [name] is a pleasant 81 year old gentleman with past  medical history of atrial fibrillation who had been chronically anticoagulated with Coumadin, presented to the emergency department with complaints of dysarthria that started at 8 AM on 01/31/2015. In the emergency room he was found to have slurred speech. Initial CT scan of brain showed mild patchy hypodensity in the cerebral white matter bilaterally consistent with ischemia of chronic duration. Radiology report negative for acute infarct. Further workup with an MRI revealed an acute nonhemorrhagic posterior left MCA territory infarct. MRA revealed occlusion of the distal left posterior MCA branch vessel. He was seen and evaluated by neurology who recommended discontinuing warfarin and starting Eliquis.   Objective: Filed Vitals:   02/01/15 1540  BP: 105/51  Pulse: 90  Temp: 97.7 F (36.5 C)  Resp: 16    Intake/Output Summary (Last 24 hours) at 02/01/15 1658 Last data filed at 02/01/15 1300  Gross per 24 hour  Intake    600 ml  Output    200 ml  Net    400 ml   Filed Weights   01/31/15 0929  Weight: 65.8 kg (145 lb 1 oz)    Exam:   General:  Patient is awake and alert, continues to have dysarthria, nontoxic appearing.  Cardiovascular: Irregular rate and rhythm normal S1-S2 no murmurs rubs or gallops  Respiratory: Bibasilar crackles, diminished breath sounds bilaterally  Abdomen: Soft nontender nondistended  Musculoskeletal: No edema  Data Reviewed: Basic Metabolic Panel:  Recent Labs Lab 01/26/15 0950 01/27/15 0331 01/31/15 0909 01/31/15 0917  NA 137 137 140 142  K 3.2* 3.8 4.0 4.0  CL 105 104 107 105  CO2 22 25 25   --   GLUCOSE 143* 112* 96 94  BUN 17 13 17 20   CREATININE 0.99 0.86 0.83 0.80  CALCIUM 9.0 8.4* 9.0  --  MG  --  1.6*  --   --    Liver Function Tests:  Recent Labs Lab 01/31/15 0909  AST 54*  ALT 66*  ALKPHOS 118  BILITOT 0.5  PROT 6.6  ALBUMIN 2.1*   No results for input(s): LIPASE, AMYLASE in the last 168 hours. No results for  input(s): AMMONIA in the last 168 hours. CBC:  Recent Labs Lab 01/26/15 0950 01/27/15 0331 01/31/15 0909 01/31/15 0917  WBC 11.1* 11.2* 14.3*  --   NEUTROABS 9.4*  --  11.8*  --   HGB 13.6 12.2* 14.0 15.0  HCT 39.4 36.5* 40.6 44.0  MCV 94.3 95.3 95.1  --   PLT 212 204 222  --    Cardiac Enzymes: No results for input(s): CKTOTAL, CKMB, CKMBINDEX, TROPONINI in the last 168 hours. BNP (last 3 results) No results for input(s): BNP in the last 8760 hours.  ProBNP (last 3 results) No results for input(s): PROBNP in the last 8760 hours.  CBG:  Recent Labs Lab 01/26/15 1216 01/31/15 1000  GLUCAP 110* 86    Recent Results (from the past 240 hour(s))  Culture, blood (routine x 2)     Status: None   Collection Time: 01/26/15  1:05 PM  Result Value Ref Range Status   Specimen Description BLOOD RIGHT WRIST  Final   Special Requests IN PEDIATRIC BOTTLE 4CC  Final   Culture NO GROWTH 5 DAYS  Final   Report Status 01/31/2015 FINAL  Final  Culture, blood (routine x 2)     Status: None   Collection Time: 01/26/15  1:09 PM  Result Value Ref Range Status   Specimen Description BLOOD RIGHT HAND  Final   Special Requests IN PEDIATRIC BOTTLE 4CC  Final   Culture NO GROWTH 5 DAYS  Final   Report Status 01/31/2015 FINAL  Final  Culture, blood (routine x 2) Call MD if unable to obtain prior to antibiotics being given     Status: None (Preliminary result)   Collection Time: 01/31/15  7:37 PM  Result Value Ref Range Status   Specimen Description BLOOD LEFT FOREARM  Final   Special Requests BOTTLES DRAWN AEROBIC AND ANAEROBIC 5CC   Final   Culture NO GROWTH < 24 HOURS  Final   Report Status PENDING  Incomplete  Culture, blood (routine x 2) Call MD if unable to obtain prior to antibiotics being given     Status: None (Preliminary result)   Collection Time: 01/31/15  7:40 PM  Result Value Ref Range Status   Specimen Description BLOOD LEFT HAND  Final   Special Requests BOTTLES DRAWN  AEROBIC ONLY 8CC  Final   Culture NO GROWTH < 24 HOURS  Final   Report Status PENDING  Incomplete     Studies: Ct Angio Head W/cm &/or Wo Cm  01/31/2015  CLINICAL DATA:  Right facial droop, right-sided weakness, and slurred speech. EXAM: CT ANGIOGRAPHY HEAD AND NECK TECHNIQUE: Multidetector CT imaging of the head and neck was performed using the standard protocol during bolus administration of intravenous contrast. Multiplanar CT image reconstructions and MIPs were obtained to evaluate the vascular anatomy. Carotid stenosis measurements (when applicable) are obtained utilizing NASCET criteria, using the distal internal carotid diameter as the denominator. CONTRAST:  31mL OMNIPAQUE IOHEXOL 350 MG/ML SOLN COMPARISON:  Noncontrast head CT earlier today. Chest radiographs 01/26/2015. FINDINGS: CTA NECK Aortic arch: 3 vessel aortic arch with mild atherosclerotic plaque. Brachiocephalic and subclavian arteries are patent without significant stenosis. Right carotid system:  Patent with minimal non stenotic plaque at the carotid bifurcation. Left carotid system: Patent without stenosis or significant atherosclerosis. Vertebral arteries: Patent without significant stenosis. Left vertebral artery is mildly dominant. Skeleton: Moderate multilevel cervical disc and facet degeneration. Other neck: Extensive consolidation is partially visualized in the right upper lobe as seen on recent chest radiographs, with areas of cavitation. This is superimposed on a background of advanced centrilobular emphysema. There are multiple enlarged upper mediastinal lymph nodes predominantly in the right paratracheal region which measure up to 1.2 cm in short axis. CTA HEAD Anterior circulation: Internal carotid arteries are patent from skullbase to carotid termini with minimal atherosclerotic calcification but no stenosis. There is a 2 mm left posterior communicating artery region infundibulum versus aneurysm. M1 segments are widely patent.  MCA bifurcations are patent. There is occlusion of a left M2 inferior division branch vessel approximately 1.5 cm distal to the MCA bifurcation (series 9, image 88 and series 11, image 117). ACAs are patent without significant stenosis. Posterior circulation: Intracranial vertebral arteries are patent with the left being dominant. Right vertebral artery is markedly hypoplastic distal to the PICA origin. SCA origins are patent. Basilar artery is diffusely small in caliber on a developmental basis without significant superimposed focal stenosis. There is a fetal origin of the right PCA. PCAs are patent without evidence of significant stenosis. Venous sinuses: Patent. Anatomic variants: Fetal origin of the right PCA. IMPRESSION: 1. No large vessel occlusion. 2. Left M2 MCA branch vessel occlusion. 3. No cervical carotid or vertebral artery stenosis. 4. 2 mm left posterior communicating artery infundibulum versus aneurysm. 5. Extensive right upper lobe lung consolidation with areas of cavitation, incompletely visualized. Mild mediastinal lymphadenopathy. These results were called by telephone at the time of interpretation on 01/31/2015 at 10:30 am to [place] , who verbally acknowledged these results. Electronically Signed   By: Logan Bores M.D.   On: 01/31/2015 10:35   Ct Head Wo Contrast  01/31/2015  CLINICAL DATA:  Code stroke. Right-sided weakness with facial droop and slurred speech EXAM: CT HEAD WITHOUT CONTRAST TECHNIQUE: Contiguous axial images were obtained from the base of the skull through the vertex without intravenous contrast. COMPARISON:  None. FINDINGS: Ventricle size normal.  Cerebral volume normal. Mild patchy hypodensity in the cerebral white matter bilaterally, most consistent with ischemia of chronic duration. Negative for acute infarct. Negative for hemorrhage or mass. Mild calcification in the basal ganglia bilaterally. Normal arterial density. Calvarium intact. IMPRESSION: Mild chronic  microvascular ischemia.  No acute abnormality. Critical Value/emergent results were called by telephone at the time of interpretation on 01/31/2015 at 9:39 am to Dr. Armida Sans, who verbally acknowledged these results. Electronically Signed   By: Franchot Gallo M.D.   On: 01/31/2015 09:39   Ct Angio Neck W/cm &/or Wo/cm  01/31/2015  CLINICAL DATA:  Right facial droop, right-sided weakness, and slurred speech. EXAM: CT ANGIOGRAPHY HEAD AND NECK TECHNIQUE: Multidetector CT imaging of the head and neck was performed using the standard protocol during bolus administration of intravenous contrast. Multiplanar CT image reconstructions and MIPs were obtained to evaluate the vascular anatomy. Carotid stenosis measurements (when applicable) are obtained utilizing NASCET criteria, using the distal internal carotid diameter as the denominator. CONTRAST:  67mL OMNIPAQUE IOHEXOL 350 MG/ML SOLN COMPARISON:  Noncontrast head CT earlier today. Chest radiographs 01/26/2015. FINDINGS: CTA NECK Aortic arch: 3 vessel aortic arch with mild atherosclerotic plaque. Brachiocephalic and subclavian arteries are patent without significant stenosis. Right carotid system: Patent with minimal non  stenotic plaque at the carotid bifurcation. Left carotid system: Patent without stenosis or significant atherosclerosis. Vertebral arteries: Patent without significant stenosis. Left vertebral artery is mildly dominant. Skeleton: Moderate multilevel cervical disc and facet degeneration. Other neck: Extensive consolidation is partially visualized in the right upper lobe as seen on recent chest radiographs, with areas of cavitation. This is superimposed on a background of advanced centrilobular emphysema. There are multiple enlarged upper mediastinal lymph nodes predominantly in the right paratracheal region which measure up to 1.2 cm in short axis. CTA HEAD Anterior circulation: Internal carotid arteries are patent from skullbase to carotid termini with  minimal atherosclerotic calcification but no stenosis. There is a 2 mm left posterior communicating artery region infundibulum versus aneurysm. M1 segments are widely patent. MCA bifurcations are patent. There is occlusion of a left M2 inferior division branch vessel approximately 1.5 cm distal to the MCA bifurcation (series 9, image 88 and series 11, image 117). ACAs are patent without significant stenosis. Posterior circulation: Intracranial vertebral arteries are patent with the left being dominant. Right vertebral artery is markedly hypoplastic distal to the PICA origin. SCA origins are patent. Basilar artery is diffusely small in caliber on a developmental basis without significant superimposed focal stenosis. There is a fetal origin of the right PCA. PCAs are patent without evidence of significant stenosis. Venous sinuses: Patent. Anatomic variants: Fetal origin of the right PCA. IMPRESSION: 1. No large vessel occlusion. 2. Left M2 MCA branch vessel occlusion. 3. No cervical carotid or vertebral artery stenosis. 4. 2 mm left posterior communicating artery infundibulum versus aneurysm. 5. Extensive right upper lobe lung consolidation with areas of cavitation, incompletely visualized. Mild mediastinal lymphadenopathy. These results were called by telephone at the time of interpretation on 01/31/2015 at 10:30 am to [place] , who verbally acknowledged these results. Electronically Signed   By: Logan Bores M.D.   On: 01/31/2015 10:35   Brian Brian [name] Head Wo Contrast  01/31/2015  CLINICAL DATA:  Since change in speech at 8 o\'clock a.m. Right arm weakness. Right facial droop. A aphasia. Right arm weakness has improved. EXAM: MRI HEAD WITHOUT CONTRAST MRA HEAD WITHOUT CONTRAST TECHNIQUE: Multiplanar, multiecho pulse sequences of the brain and surrounding structures were obtained without intravenous contrast. Angiographic images of the head were obtained using MRA technique without contrast. COMPARISON:  CT head  without contrast in CTA head and neck from the same day. FINDINGS: MRI HEAD FINDINGS Acute nonhemorrhagic posterior left MCA territory infarct is confirmed. This involves the left sylvian fissure extends superiorly within the left parietal lobe. There is at least 1 additional punctate cortical infarct in the left parietal lobe on image 31 of series 7. T2 changes are evident within the area of acute infarction. Additional subcortical and periventricular T2 changes are evident bilaterally. Flow is present in the major intracranial arteries. Bilateral lens replacements are present. The paranasal sinuses mastoid air cells are clear. MRA HEAD FINDINGS Internal carotid arteries are within normal limits from the high cervical segments through the ICA termini bilaterally. The A1 and M1 segments are normal. Anterior communicating artery is patent. ACA branch vessels are within normal limits. The MCA bifurcations are intact bilaterally. Right MCA branch vessels are normal. A distal posterior left MCA branch vessel occlusion is noted. One additional left M3 branch vessels opacified compared to the earlier CTA. The left vertebral artery is the dominant vessel. The right PICA origin is visualized and normal. AICA vessels are noted bilaterally. The left posterior cerebral artery originates from  the basilar tip. The right posterior cerebral artery originates from the basilar tip and a posterior communicating artery. The PCA branch vessels are intact. IMPRESSION: 1. Acute nonhemorrhagic posterior left MCA territory infarct is confirmed. 2. Occlusion of distal left posterior MCA branch vessel. There is opacification of an additional left MCA branch vessels since the earlier CTA study. 3. Extensive white matter disease is present in addition to the acute infarct. Electronically Signed   By: San Morelle M.D.   On: 01/31/2015 16:09   Brian Brain Wo Contrast  01/31/2015  CLINICAL DATA:  Since change in speech at 8 o\'clock a.m.  Right arm weakness. Right facial droop. A aphasia. Right arm weakness has improved. EXAM: MRI HEAD WITHOUT CONTRAST MRA HEAD WITHOUT CONTRAST TECHNIQUE: Multiplanar, multiecho pulse sequences of the brain and surrounding structures were obtained without intravenous contrast. Angiographic images of the head were obtained using MRA technique without contrast. COMPARISON:  CT head without contrast in CTA head and neck from the same day. FINDINGS: MRI HEAD FINDINGS Acute nonhemorrhagic posterior left MCA territory infarct is confirmed. This involves the left sylvian fissure extends superiorly within the left parietal lobe. There is at least 1 additional punctate cortical infarct in the left parietal lobe on image 31 of series 7. T2 changes are evident within the area of acute infarction. Additional subcortical and periventricular T2 changes are evident bilaterally. Flow is present in the major intracranial arteries. Bilateral lens replacements are present. The paranasal sinuses mastoid air cells are clear. MRA HEAD FINDINGS Internal carotid arteries are within normal limits from the high cervical segments through the ICA termini bilaterally. The A1 and M1 segments are normal. Anterior communicating artery is patent. ACA branch vessels are within normal limits. The MCA bifurcations are intact bilaterally. Right MCA branch vessels are normal. A distal posterior left MCA branch vessel occlusion is noted. One additional left M3 branch vessels opacified compared to the earlier CTA. The left vertebral artery is the dominant vessel. The right PICA origin is visualized and normal. AICA vessels are noted bilaterally. The left posterior cerebral artery originates from the basilar tip. The right posterior cerebral artery originates from the basilar tip and a posterior communicating artery. The PCA branch vessels are intact. IMPRESSION: 1. Acute nonhemorrhagic posterior left MCA territory infarct is confirmed. 2. Occlusion of  distal left posterior MCA branch vessel. There is opacification of an additional left MCA branch vessels since the earlier CTA study. 3. Extensive white matter disease is present in addition to the acute infarct. Electronically Signed   By: San Morelle M.D.   On: 01/31/2015 16:09   Dg Chest Port 1 View  01/31/2015  CLINICAL DATA:  Pneumonia and call EXAM: PORTABLE CHEST 1 VIEW COMPARISON:  Radiograph 01/26/2015 FINDINGS: Normal cardiac silhouette. There is airspace opacity in the RIGHT upper lobe which is increased slightly in density. Mild airspace disease in the RIGHT lower lobe appears chronic. Underlying severe emphysematous change in the RIGHT upper lobe. IMPRESSION: Slight worsening of RIGHT upper lobe pneumonia superimposed on emphysematous change. Electronically Signed   By: Suzy Bouchard M.D.   On: 01/31/2015 11:42   Dg Swallowing Func-speech Pathology  02/01/2015  Objective Swallowing Evaluation:   Patient Details Name: JARMARCUS Brian [name] MRN: [id number] Date of Birth: [birth year] Today's Date: 02/01/2015 Time: SLP Start Time (ACUTE ONLY): 0945-SLP Stop Time (ACUTE ONLY): 1010 SLP Time Calculation (min) (ACUTE ONLY): 25 min Past Medical History: Past Medical History Diagnosis Date . Muscle tremor    saw neurology  remotely elsewhere, was Rx inderal . Crohn's ileocolitis ([place])  . Osteopenia    per DEXA 11/2008 . Hypertension 11/11   mild . Hemorrhoids  . Hypertrophy of prostate    w/o UR obst & oth luts . Peripheral neuropathy ([facility])    h/o neg w/u . ED (erectile dysfunction)  . Hypertriglyceridemia  . COPD (chronic obstructive pulmonary disease) ([place])    recent dx--no acute problems . Hiatal hernia 1956   found while in service . Anxiety  . Atrial fibrillation with RVR ([place])  . Long term (current) use of anticoagulants 02/21/2011 . Serrated adenoma of colon 03/2010 . Renal cyst    bilateral . Pulmonary fibrosis ([place]) 2015   Rx Esbriet ~ 10-2013 ([name]), Dr. [name] . On home oxygen therapy    "2L;  20-24h for the past week" (01/26/2015) . Emphysema lung ([place]) 2016   Dr. [name] . B12 deficiency anemia  . Idiopathic pulmonary fibrosis ([place]) 2016   Dr. [name] . Pneumonia ?2015 . CAP (community acquired pneumonia) 01/26/2015 . Osteoarthritis    "maybe in my hands, feet" (01/26/2015) . Depression  . Squamous cell carcinoma of skin of scalp    tx'd ~ 74yrs; "froze them off" . Squamous cell carcinoma, face     "froze them off" . Melanoma of scalp [facility])  Past Surgical History: Past Surgical History Procedure Laterality Date . Colon resection  1984   resection of distal ileum and cecum w/ appendectomy, 18-inch small intestine, for Crohn's disease . Colon surgery   . Appendectomy  1984 . Cataract extraction w/ intraocular lens  implant, bilateral Bilateral 2007 . Cholecystectomy  02/17/2011   Procedure: LAPAROSCOPIC CHOLECYSTECTOMY WITH INTRAOPERATIVE CHOLANGIOGRAM;  Surgeon: Edward Jolly, MD;  Location: [facility]S;  Service: General;  Laterality: N/A; . Mohs surgery Right ~ 2014   "side of my scalp" HPI: 81 y.o. male with pulmonary fibrosis, atrial fibrillation, Chron's, HTN, COPD, hiatal hernia and pna 01/26/15 admitted with right side weakness and inability to speak. CT negative, MRI showed acute nonhemorrhagic posterior left MCA territory infarct. CXR Slight worsening of RIGHT upper lobe pneumonia superimposed on emphysematous change. BSE 11/4 without difficulty and regular/thin recommended. Subjective: pleasant, talkative Assessment / Plan / Recommendation CHL IP CLINICAL IMPRESSIONS 02/01/2015 Therapy Diagnosis Severe pharyngeal phase dysphagia Clinical Impression Pt demonstrated severe oropharyngeal neuromuscular dysphagia characterized by sensorimotor impariment. Pt has adequate oral movement, however due reduced sensation and muscular control results in premature spillage. Pt's pharyngeal phase is characterized by reduced hyolaryngeal elevation resulting in reduced epiglottic deflection and delayed swallow  initation to the valleculae for puree and pyriforms for liquids, which results in unsensed penetration/aspiration of liquids. Pt did not sense aspiration until after it passed through his vocal cords.  Pt has significant pharyngeal residue across all consistences. Pt's global aphasia interferes with compensatory strategies and max verbal, tactile, and visual cues for second swallow to clear residue. While a chin tuck with mod-max assist reduced vallecular residue with thin liquids, SLP recommends pt remain NPO due to high risk of aspiration and risk of futher respiratory distress; pt has no functional reserve due to baseline pulmonary fibrosis and right upper lobe pneumonia prior to stroke.  Pt will benefit from CIR for intensive dysphagia and aphasia treatment. SLP will f/u with diagnostic assessment and potential repeat of objective assessment if swallow function has improved.    CHL IP TREATMENT RECOMMENDATION 02/01/2015 Treatment Recommendations Therapy as outlined in treatment plan below   CHL IP DIET RECOMMENDATION 02/01/2015 SLP Diet Recommendations NPO;Alternative  means - temporary Liquid Administration via -- Medication Administration Via alternative means Compensations -- Postural Changes --   CHL IP OTHER RECOMMENDATIONS 02/01/2015 Recommended Consults -- Oral Care Recommendations Oral care QID Other Recommendations --   CHL IP FOLLOW UP RECOMMENDATIONS 02/01/2015 Follow up Recommendations Inpatient Rehab   CHL IP FREQUENCY AND DURATION 02/01/2015 Speech Therapy Frequency (ACUTE ONLY) min 3x week Treatment Duration --      CHL IP ORAL PHASE 02/01/2015 Oral Phase Impaired Oral - Pudding Teaspoon -- Oral - Pudding Cup -- Oral - Honey Teaspoon -- Oral - Honey Cup -- Oral - Nectar Teaspoon -- Oral - Nectar Cup Delayed oral transit;Premature spillage Oral - Nectar Straw Delayed oral transit;Premature spillage Oral - Thin Teaspoon -- Oral - Thin Cup Delayed oral transit;Premature spillage Oral - Thin Straw  Delayed oral transit;Premature spillage Oral - Puree Delayed oral transit;Premature spillage Oral - Mech Soft -- Oral - Regular -- Oral - Multi-Consistency -- Oral - Pill -- Oral Phase - Comment --  CHL IP PHARYNGEAL PHASE 02/01/2015 Pharyngeal Phase Nectar;Thin;Solids Pharyngeal- Pudding Teaspoon -- Pharyngeal -- Pharyngeal- Pudding Cup -- Pharyngeal -- Pharyngeal- Honey Teaspoon -- Pharyngeal -- Pharyngeal- Honey Cup -- Pharyngeal -- Pharyngeal- Nectar Teaspoon NT Pharyngeal -- Pharyngeal- Nectar Cup Delayed swallow initiation-vallecula;Delayed swallow initiation-pyriform sinuses;Reduced epiglottic inversion;Penetration/Aspiration during swallow;Penetration/Aspiration before swallow;Trace aspiration;Pharyngeal residue - valleculae;Pharyngeal residue - pyriform Pharyngeal Material enters airway, remains ABOVE vocal cords and not ejected out Pharyngeal- Nectar Straw Delayed swallow initiation-vallecula;Delayed swallow initiation-pyriform sinuses;Reduced epiglottic inversion;Penetration/Aspiration during swallow;Penetration/Apiration after swallow;Trace aspiration;Pharyngeal residue - valleculae;Pharyngeal residue - pyriform Pharyngeal Material enters airway, remains ABOVE vocal cords and not ejected out Pharyngeal- Thin Teaspoon NT Pharyngeal -- Pharyngeal- Thin Cup Delayed swallow initiation-vallecula;Delayed swallow initiation-pyriform sinuses;Reduced epiglottic inversion;Penetration/Aspiration during swallow;Penetration/Apiration after swallow;Trace aspiration;Pharyngeal residue - valleculae;Pharyngeal residue - pyriform Pharyngeal Material enters airway, passes BELOW cords without attempt by patient to eject out (silent aspiration);Material enters airway, passes BELOW cords and not ejected out despite cough attempt by patient Pharyngeal- Thin Straw Delayed swallow initiation-vallecula;Delayed swallow initiation-pyriform sinuses;Reduced epiglottic inversion;Penetration/Aspiration during  swallow;Penetration/Apiration after swallow;Trace aspiration;Pharyngeal residue - valleculae;Pharyngeal residue - pyriform Pharyngeal Material enters airway, passes BELOW cords without attempt by patient to eject out (silent aspiration);Material enters airway, passes BELOW cords and not ejected out despite cough attempt by patient Pharyngeal- Puree Delayed swallow initiation-vallecula;Reduced epiglottic inversion;Pharyngeal residue - valleculae;Pharyngeal residue - pyriform Pharyngeal -- Pharyngeal- Mechanical Soft NT Pharyngeal -- Pharyngeal- Regular NT Pharyngeal -- Pharyngeal- Multi-consistency NT Pharyngeal -- Pharyngeal- Pill NT Pharyngeal -- Pharyngeal Comment --  CHL IP CERVICAL ESOPHAGEAL PHASE 02/01/2015 Cervical Esophageal Phase WFL Pudding Teaspoon -- Pudding Cup -- Honey Teaspoon -- Honey Cup -- Nectar Teaspoon -- Nectar Cup -- Nectar Straw -- Thin Teaspoon -- Thin Cup -- Thin Straw -- Puree -- Mechanical Soft -- Regular -- Multi-consistency -- Pill -- Cervical Esophageal Comment -- Completed by Lanier Ensign, SLP Student Supervised and reviewed by Herbie Baltimore MA CCC-SLP [place], Katherene Ponto 02/01/2015, 2:43 PM               Scheduled Meds: . apixaban  5 mg Oral BID  . busPIRone  15 mg Oral BID  . gemfibrozil  600 mg Oral BID  . mesalamine  500 mg Oral BID  . piperacillin-tazobactam (ZOSYN)  IV  3.375 g Intravenous Q8H  . Pirfenidone  3 capsule Oral TID WC  . pravastatin  10 mg Oral q1800  . vancomycin  750 mg Intravenous Q12H   Continuous Infusions:   Active Problems:   HYPERTRIGLYCERIDEMIA  Essential hypertension   CROHN'S DISEASE-LARGE & SMALL INTESTINE   RECTAL BLEEDING   Dyspnea   Atrial fibrillation with controlled ventricular response ([place])   Long term (current) use of anticoagulants   Stroke [facility])   CVA (cerebral infarction)   Aphasia    Time spent: 35 min    Kelvin Cellar  [facility] Pager [phone]. If 7PM-7AM, please contact night-coverage  at [email or web address], password [email or web address] 02/01/2015, 4:58 PM  LOS: 0 days

## 2015-02-01 NOTE — Evaluation (Addendum)
Physical Therapy Evaluation Patient Details Name: Brian [name] MRN: [id number] DOB: Nov 30, 1928 Today's Date: 02/01/2015   History of Present Illness  Patient is a 81 y/o male with hx of pulmonary fibrosis and A-fb admitted code stroke after wife noticed changes in speech. Pt had flaccid RUE, right facial droop and aphasic. While in ED right arm improved but pt remained aphasic with right field cut. NIHSS:10. TPA not given secondary to INR 1.9. MRI-L MCA infarct. extensive white matter infarct.MRA-posterior distal L MCA branch occlusion. CTA head/neck- Left M2 MCA branch occlusion    Clinical Impression  Patient presents with global aphasia, weakness, impaired cardiovascular endurance and balance deficits s/p CVA impacting mobility. Able to follow simple commands inconsistently with gestures and increased time. Tolerated ambulation with Min A for safety. Sp02 decreases to 82% on 6L/min 02 during ambulation. Independent PTA. Would benefit from CIR to maximize independence and mobility prior to return home.     Follow Up Recommendations CIR    Equipment Recommendations  Other (comment) (TBD)    Recommendations for Other Services       Precautions / Restrictions Precautions Precautions: Fall Restrictions Weight Bearing Restrictions: No      Mobility  Bed Mobility Overal bed mobility: Needs Assistance Bed Mobility: Supine to Sit;Sit to Supine     Supine to sit: Supervision;HOB elevated Sit to supine: Supervision;HOB elevated   General bed mobility comments: Used gesturing to get pt to come to EOB and return to supine with increased time.   Transfers Overall transfer level: Needs assistance Equipment used: None Transfers: Sit to/from Stand Sit to Stand: Min assist         General transfer comment: Min A to boost from EOB. Unsteady upon standing.  Ambulation/Gait Ambulation/Gait assistance: Min assist Ambulation Distance (Feet): 100 Feet Assistive device: 1 person  hand held assist Gait Pattern/deviations: Step-through pattern;Decreased stride length   Gait velocity interpretation: <1.8 ft/sec, indicative of risk for recurrent falls General Gait Details: Short step lengths. Pt veering towards right side during ambulation; Min A for balance to prevent fall. Not able to follow directional cues. Sp02 decreased to 82% on 6L/min 02.   Stairs            Wheelchair Mobility    Modified Rankin (Stroke Patients Only) Modified Rankin (Stroke Patients Only) Pre-Morbid Rankin Score: No symptoms Modified Rankin: Moderately severe disability     Balance Overall balance assessment: Needs assistance Sitting-balance support: Feet supported;No upper extremity supported Sitting balance-Leahy Scale: Fair     Standing balance support: During functional activity Standing balance-Leahy Scale: Poor Standing balance comment: Relient on external support for static and dynamic standing balance.                              Pertinent Vitals/Pain Faces Pain Scale: No hurt    Home Living Family/patient expects to be discharged to:: Inpatient rehab Living Arrangements: Spouse/significant other Available Help at Discharge: Family;Available 24 hours/day Type of Home: House Home Access: Level entry     Home Layout: One level Home Equipment: None      Prior Function Level of Independence: Independent         Comments: Pt wears 02 PRN 2L/min 02. Pt recently d/ced from hospital with diagnosis of PNA.      Hand Dominance        Extremity/Trunk Assessment   Upper Extremity Assessment: Defer to OT evaluation  Lower Extremity Assessment: Difficult to assess due to impaired cognition (Difficult to assess as pt not able to follow commands to participate in MMT.  Able to left BLES off bed against gravity to donn socks and perform ambulation without knee buckling demonstrating functional strength.)         Communication    Communication: Expressive difficulties;Receptive difficulties (global aphasia noted)  Cognition Arousal/Alertness: Awake/alert Behavior During Therapy: WFL for tasks assessed/performed Overall Cognitive Status: Impaired/Different from baseline Area of Impairment: Following commands       Following Commands: Follows one step commands inconsistently;Follows one step commands with increased time (with gestures and manual cues.)       General Comments: Pt able to lift UEs up in air gesturing therapist and kick out LEs imitating therapist.     General Comments General comments (skin integrity, edema, etc.): Wife and grand daughter present in room during session.    Exercises        Assessment/Plan    PT Assessment Patient needs continued PT services  PT Diagnosis Difficulty walking   PT Problem List Cardiopulmonary status limiting activity;Decreased cognition;Decreased balance;Decreased mobility;Decreased activity tolerance;Decreased strength  PT Treatment Interventions Balance training;Gait training;Functional mobility training;Therapeutic activities;Therapeutic exercise;Patient/family education;Neuromuscular re-education   PT Goals (Current goals can be found in the Care Plan section) Acute Rehab PT Goals Patient Stated Goal: pt unable to state due to aphasia. PT Goal Formulation: Patient unable to participate in goal setting Time For Goal Achievement: 02/15/15 Potential to Achieve Goals: Fair    Frequency Min 4X/week   Barriers to discharge        Co-evaluation               End of Session Equipment Utilized During Treatment: Gait belt;Oxygen Activity Tolerance: Patient tolerated treatment well;Treatment limited secondary to medical complications (Comment) (drop in Sp02.) Patient left: in bed;with call bell/phone within reach;with bed alarm set;with family/visitor present Nurse Communication: Mobility status         Time: 1406-1430 PT Time Calculation (min)  (ACUTE ONLY): 24 min   Charges:   PT Evaluation $Initial PT Evaluation Tier I: 1 Procedure PT Treatments $Gait Training: 8-22 mins   PT G Codes:        Cena Bruhn A Trevaun Rendleman 02/01/2015, 3:03 PM Wray Kearns, [place], DPT [phone]

## 2015-02-01 NOTE — Evaluation (Signed)
Speech Language Pathology Evaluation Patient Details Name: Brian [name] MRN: [id number] DOB: 14-Jan-1929 Today's Date: 02/01/2015 Time: [id number] SLP Time Calculation (min) (ACUTE ONLY): 25 min  Problem List:  Patient Active Problem List   Diagnosis Date Noted  . Stroke ([place]) 01/31/2015  . CVA (cerebral infarction) 01/31/2015  . Aphasia   . Hypokalemia   . CAP (community acquired pneumonia) 01/26/2015  . Pulmonary fibrosis ([place]) 01/26/2015  . PCP NOTES >>>>>>>>>>>> 12/14/2014  . Vitamin D deficiency 11/04/2013  . Vasomotor rhinitis 09/22/2013  . Diarrhea 07/08/2013  . Crohn's disease ([place]) 07/08/2013  . Postinflammatory pulmonary fibrosis ([place]) 05/26/2013  . Anxiety state 05/20/2013  . Sprain of shoulder, left 06/01/2012  . Personal history of colonic polyps 04/12/2012  . Pulmonary nodules 03/02/2011  . Cough 03/02/2011  . Long term (current) use of anticoagulants 02/21/2011  . Atrial fibrillation with controlled ventricular response ([place]) 02/17/2011  . Annual physical exam 12/23/2010  . Dyspnea 09/03/2010  . HEMORRHOIDS-INTERNAL 03/08/2010  . [place] INTESTINE 03/08/2010  . RECTAL BLEEDING 03/08/2010  . HEMATOCHEZIA 03/06/2010  . Essential hypertension 02/04/2010  . HYPERTROPHY PROSTATE W/O UR OBST & OTH LUTS 12/10/2009  . Osteopenia 12/07/2009  . PERIPHERAL NEUROPATHY 08/29/2009  . FECAL INCONTINENCE 01/29/2009  . OSTEOARTHRITIS, ANKLE, RIGHT 11/01/2008  . ERECTILE DYSFUNCTION 12/02/2007  . TREMOR 12/02/2007  . CARCINOMA, SKIN, SQUAMOUS CELL 09/16/2006  . B12 DEFICIENCY 08/27/2006  . HYPERTRIGLYCERIDEMIA 08/27/2006   Past Medical History:  Past Medical History  Diagnosis Date  . Muscle tremor     saw neurology remotely elsewhere, was Rx inderal  . Crohn's ileocolitis ([place])   . Osteopenia     per DEXA 11/2008  . Hypertension 11/11    mild  . Hemorrhoids   . Hypertrophy of prostate     w/o UR obst & oth luts  . Peripheral neuropathy  ([facility])     h/o neg w/u  . ED (erectile dysfunction)   . Hypertriglyceridemia   . COPD (chronic obstructive pulmonary disease) ([place])     recent dx--no acute problems  . Hiatal hernia 1956    found while in service  . Anxiety   . Atrial fibrillation with RVR ([place])   . Long term (current) use of anticoagulants 02/21/2011  . Serrated adenoma of colon 03/2010  . Renal cyst     bilateral  . Pulmonary fibrosis ([place]) 2015    Rx Esbriet ~ 10-2013 ([name]), Dr. [name]  . On home oxygen therapy     "2L; 20-24h for the past week" (01/26/2015)  . Emphysema lung ([place]) 2016    Dr. [name]  . B12 deficiency anemia   . Idiopathic pulmonary fibrosis ([place]) 2016    Dr. [name]  . Pneumonia ?2015  . CAP (community acquired pneumonia) 01/26/2015  . Osteoarthritis     "maybe in my hands, feet" (01/26/2015)  . Depression   . Squamous cell carcinoma of skin of scalp     tx'd ~ 64yrs; "froze them off"  . Squamous cell carcinoma, face      "froze them off"  . Melanoma of scalp [facility])    Past Surgical History:  Past Surgical History  Procedure Laterality Date  . Colon resection  1984    resection of distal ileum and cecum w/ appendectomy, 18-inch small intestine, for Crohn's disease  . Colon surgery    . Appendectomy  1984  . Cataract extraction w/ intraocular lens  implant, bilateral Bilateral 2007  . Cholecystectomy  02/17/2011  Procedure: LAPAROSCOPIC CHOLECYSTECTOMY WITH INTRAOPERATIVE CHOLANGIOGRAM;  Surgeon: Edward Jolly, MD;  Location: [facility]S;  Service: General;  Laterality: N/A;  . Mohs surgery Right ~ 2014    "side of my scalp"   HPI:  81 y.o. male with pulmonary fibrosis, atrial fibrillation, Chron's, HTN, COPD, hiatal hernia and pna 01/26/15 admitted with right side weakness and inability to speak. CT negative, MRI showed acute nonhemorrhagic posterior left MCA territory infarct. CXR Slight worsening of RIGHT upper lobe pneumonia superimposed on emphysematous change. BSE 11/4 without  difficulty and regular/thin recommended.   Assessment / Plan / Recommendation Clinical Impression  Pt sustained attention throughout assessment and presents with global aphasia: impaired auditory comprehension, naming, and repetition. Pt did not follow basic one step commands and choose and object in a field of two with max verbal, visual, and tactile cues. Pt produces automatic phrases "yeah", "mine," "thanks ma'am" and "bye" at inappropriate and appropriate times. Pt produced neologisms when presented with objects to name, naming impairment did not improve with phonemic cues. SLP trialed singing and counting with no response from pt. Global aphasia interfered with assessment of executive functioning and memory. SLP will f/u with diagnostic assessment and family education.    SLP Assessment  Patient needs continued Speech Lanaguage Pathology Services    Follow Up Recommendations  Inpatient Rehab    Frequency and Duration min 3x week  2 weeks      SLP Evaluation Prior Functioning  Cognitive/Linguistic Baseline: Within functional limits Type of Home: House   Cognition  Overall Cognitive Status: Impaired/Different from baseline Arousal/Alertness: Awake/alert Orientation Level: Other (comment) (unable to assess due to aphasia) Attention: Sustained Sustained Attention: Appears intact Awareness: Impaired Awareness Impairment: Intellectual impairment;Emergent impairment Behaviors: Perseveration    Comprehension  Auditory Comprehension Overall Auditory Comprehension: Impaired Yes/No Questions: Impaired Basic Biographical Questions: 0-25% accurate Commands: Impaired One Step Basic Commands: 0-24% accurate Conversation: Simple EffectiveTechniques: Repetition;Visual/Gestural cues Visual Recognition/Discrimination Discrimination: Not tested Reading Comprehension Reading Status: Not tested    Expression Expression Primary Mode of Expression: Verbal Verbal Expression Overall Verbal  Expression: Impaired Initiation: No impairment Automatic Speech: Counting Level of Generative/Spontaneous Verbalization: Conversation Repetition: Impaired Level of Impairment: Word level Naming: Impairment Responsive: 0-25% accurate Confrontation: Impaired Verbal Errors: Neologisms;Perseveration;Not aware of errors Pragmatics: No impairment Written Expression Written Expression: Not tested   Oral / Motor Oral Motor/Sensory Function Overall Oral Motor/Sensory Function: Within functional limits Facial ROM: Within Functional Limits Facial Symmetry: Within Functional Limits Lingual ROM: Within Functional Limits Lingual Symmetry: Within Functional Limits Motor Speech Overall Motor Speech: Appears within functional limits for tasks assessed Respiration: Within functional limits Phonation: Normal;Wet Resonance: Within functional limits Articulation: Within functional limitis Intelligibility: Intelligible Motor Planning: Not tested    Lanier Ensign, Student-SLP  Lanier Ensign 02/01/2015, 11:19 AM

## 2015-02-02 ENCOUNTER — Inpatient Hospital Stay (HOSPITAL_COMMUNITY): Payer: Medicare Other

## 2015-02-02 ENCOUNTER — Inpatient Hospital Stay (HOSPITAL_COMMUNITY)
Admission: AD | Admit: 2015-02-02 | Discharge: 2015-02-16 | DRG: 064 | Disposition: A | Discharge status: Home Health | Payer: Medicare Other | Source: Intra-hospital | Attending: Physical Medicine & Rehabilitation | Admitting: Physical Medicine & Rehabilitation

## 2015-02-02 DIAGNOSIS — R4701 Aphasia: Secondary | ICD-10-CM | POA: Diagnosis present

## 2015-02-02 DIAGNOSIS — K5080 Crohn's disease of both small and large intestine without complications: Secondary | ICD-10-CM | POA: Diagnosis present

## 2015-02-02 DIAGNOSIS — I4891 Unspecified atrial fibrillation: Secondary | ICD-10-CM | POA: Diagnosis present

## 2015-02-02 DIAGNOSIS — J84112 Idiopathic pulmonary fibrosis: Secondary | ICD-10-CM | POA: Diagnosis present

## 2015-02-02 DIAGNOSIS — I69320 Aphasia following cerebral infarction: Secondary | ICD-10-CM

## 2015-02-02 DIAGNOSIS — I63412 Cerebral infarction due to embolism of left middle cerebral artery: Secondary | ICD-10-CM | POA: Diagnosis present

## 2015-02-02 DIAGNOSIS — R040 Epistaxis: Secondary | ICD-10-CM | POA: Diagnosis present

## 2015-02-02 DIAGNOSIS — I63512 Cerebral infarction due to unspecified occlusion or stenosis of left middle cerebral artery: Secondary | ICD-10-CM | POA: Diagnosis not present

## 2015-02-02 DIAGNOSIS — Z7901 Long term (current) use of anticoagulants: Secondary | ICD-10-CM

## 2015-02-02 DIAGNOSIS — R1310 Dysphagia, unspecified: Secondary | ICD-10-CM | POA: Diagnosis not present

## 2015-02-02 DIAGNOSIS — F329 Major depressive disorder, single episode, unspecified: Secondary | ICD-10-CM | POA: Diagnosis present

## 2015-02-02 DIAGNOSIS — Y95 Nosocomial condition: Secondary | ICD-10-CM | POA: Diagnosis present

## 2015-02-02 DIAGNOSIS — E785 Hyperlipidemia, unspecified: Secondary | ICD-10-CM | POA: Diagnosis present

## 2015-02-02 DIAGNOSIS — Z87891 Personal history of nicotine dependence: Secondary | ICD-10-CM

## 2015-02-02 DIAGNOSIS — J159 Unspecified bacterial pneumonia: Secondary | ICD-10-CM | POA: Diagnosis not present

## 2015-02-02 DIAGNOSIS — I69920 Aphasia following unspecified cerebrovascular disease: Secondary | ICD-10-CM | POA: Diagnosis not present

## 2015-02-02 DIAGNOSIS — J189 Pneumonia, unspecified organism: Secondary | ICD-10-CM | POA: Diagnosis present

## 2015-02-02 DIAGNOSIS — Z9981 Dependence on supplemental oxygen: Secondary | ICD-10-CM

## 2015-02-02 DIAGNOSIS — J8410 Pulmonary fibrosis, unspecified: Secondary | ICD-10-CM

## 2015-02-02 DIAGNOSIS — R1314 Dysphagia, pharyngoesophageal phase: Secondary | ICD-10-CM

## 2015-02-02 DIAGNOSIS — E781 Pure hyperglyceridemia: Secondary | ICD-10-CM

## 2015-02-02 LAB — BASIC METABOLIC PANEL
Anion gap: 10 (ref 5–15)
BUN: 12 mg/dL (ref 6–20)
CHLORIDE: 100 mmol/L — AB (ref 101–111)
CO2: 27 mmol/L (ref 22–32)
Calcium: 8.3 mg/dL — ABNORMAL LOW (ref 8.9–10.3)
Creatinine, Ser: 0.89 mg/dL (ref 0.61–1.24)
GFR calc Af Amer: >60 mL/min (ref >60)
GFR calc non Af Amer: >60 mL/min (ref >60)
Glucose, Bld: 123 mg/dL — ABNORMAL HIGH (ref 65–99)
POTASSIUM: 3 mmol/L — AB (ref 3.5–5.1)
SODIUM: 137 mmol/L (ref 135–145)

## 2015-02-02 LAB — CBC
HCT: 37.1 % — ABNORMAL LOW (ref 39.0–52.0)
HEMOGLOBIN: 12.5 g/dL — AB (ref 13.0–17.0)
MCH: 32 pg (ref 26.0–34.0)
MCHC: 33.7 g/dL (ref 30.0–36.0)
MCV: 94.9 fL (ref 78.0–100.0)
Platelets: 218 10*3/uL (ref 150–400)
RBC: 3.91 MIL/uL — AB (ref 4.22–5.81)
RDW: 12.7 % (ref 11.5–15.5)
WBC: 17.5 10*3/uL — ABNORMAL HIGH (ref 4.0–10.5)

## 2015-02-02 MED ORDER — JEVITY 1.2 CAL PO LIQD: 1000.0000 mL | ORAL | Status: DC

## 2015-02-02 MED ORDER — APIXABAN 5 MG PO TABS: 5.0000 mg | ORAL_TABLET | Freq: Two times a day (BID) | ORAL | Status: DC

## 2015-02-02 MED ORDER — ONDANSETRON HCL 4 MG PO TABS
4.0000 mg | ORAL_TABLET | Freq: Four times a day (QID) | ORAL | Status: DC | PRN
Start: 2015-02-02 — End: 2015-02-16

## 2015-02-02 MED ORDER — LEVOFLOXACIN 25 MG/ML PO SOLN: 500.0000 mg | Freq: Every day | ORAL | Status: DC

## 2015-02-02 MED ORDER — LEVOFLOXACIN 25 MG/ML PO SOLN
500.0000 mg | Freq: Every day | ORAL | Status: DC
  Administered 2015-02-03 – 2015-02-11 (×9): 500 mg
  Filled 2015-02-02 (×11): qty 20

## 2015-02-02 MED ORDER — JEVITY 1.2 CAL PO LIQD
1000.0000 mL | ORAL | Status: DC
  Filled 2015-02-02 (×3): qty 1000

## 2015-02-02 MED ORDER — BUSPIRONE HCL 15 MG PO TABS
15.0000 mg | ORAL_TABLET | Freq: Two times a day (BID) | ORAL | Status: DC
Start: 2015-02-03 — End: 2015-02-16
  Administered 2015-02-03 – 2015-02-16 (×26): 15 mg
  Filled 2015-02-02 (×30): qty 1

## 2015-02-02 MED ORDER — LEVOFLOXACIN 25 MG/ML PO SOLN
500.0000 mg | Freq: Every day | ORAL | Status: DC
  Filled 2015-02-02: qty 20

## 2015-02-02 MED ORDER — AZELASTINE HCL 0.1 % NA SOLN
1.0000 | Freq: Two times a day (BID) | NASAL | Status: DC | PRN
  Filled 2015-02-02: qty 30

## 2015-02-02 MED ORDER — CLONAZEPAM 0.5 MG PO TABS: 0.2500 mg | ORAL_TABLET | Freq: Three times a day (TID) | ORAL | Status: DC | PRN

## 2015-02-02 MED ORDER — FREE WATER
90.0000 mL | Status: DC
  Administered 2015-02-02: 90 mL

## 2015-02-02 MED ORDER — POTASSIUM CHLORIDE CRYS ER 20 MEQ PO TBCR
40.0000 meq | EXTENDED_RELEASE_TABLET | Freq: Once | ORAL | Status: AC
  Administered 2015-02-02: 40 meq via ORAL
  Filled 2015-02-02: qty 2

## 2015-02-02 MED ORDER — JEVITY 1.2 CAL PO LIQD
1000.0000 mL | ORAL | Status: DC
  Administered 2015-02-02: 1000 mL
  Filled 2015-02-02 (×4): qty 1000

## 2015-02-02 MED ORDER — ONDANSETRON HCL 4 MG/2ML IJ SOLN: 4.0000 mg | Freq: Four times a day (QID) | INTRAMUSCULAR | Status: DC | PRN

## 2015-02-02 MED ORDER — PIRFENIDONE 267 MG PO CAPS
3.0000 | ORAL_CAPSULE | Freq: Three times a day (TID) | ORAL | Status: DC
  Administered 2015-02-03 – 2015-02-16 (×40): 3 via ORAL
  Filled 2015-02-02 (×10): qty 3

## 2015-02-02 MED ORDER — PRAVASTATIN SODIUM 10 MG PO TABS: 10.0000 mg | ORAL_TABLET | Freq: Every day | ORAL | Status: DC

## 2015-02-02 MED ORDER — SORBITOL 70 % SOLN: 30.0000 mL | Freq: Every day | Status: DC | PRN

## 2015-02-02 MED ORDER — FREE WATER
90.0000 mL | Status: DC
  Administered 2015-02-02 – 2015-02-03 (×4): 90 mL

## 2015-02-02 MED ORDER — APIXABAN 5 MG PO TABS
5.0000 mg | ORAL_TABLET | Freq: Two times a day (BID) | ORAL | Status: DC
  Administered 2015-02-02 – 2015-02-09 (×14): 5 mg
  Filled 2015-02-02 (×12): qty 1
  Filled 2015-02-02: qty 2
  Filled 2015-02-02: qty 1

## 2015-02-02 MED ORDER — GEMFIBROZIL 600 MG PO TABS
600.0000 mg | ORAL_TABLET | Freq: Two times a day (BID) | ORAL | Status: DC
  Administered 2015-02-03 – 2015-02-16 (×26): 600 mg
  Filled 2015-02-02 (×26): qty 1

## 2015-02-02 MED ORDER — MESALAMINE ER 250 MG PO CPCR
500.0000 mg | ORAL_CAPSULE | Freq: Two times a day (BID) | ORAL | Status: DC
  Administered 2015-02-02 – 2015-02-03 (×3): 500 mg via ORAL
  Filled 2015-02-02 (×6): qty 2

## 2015-02-02 MED ORDER — FREE WATER: 90.0000 mL | Status: DC

## 2015-02-02 MED ORDER — PRAVASTATIN SODIUM 20 MG PO TABS
10.0000 mg | ORAL_TABLET | Freq: Every day | ORAL | Status: DC
  Administered 2015-02-03 – 2015-02-15 (×13): 10 mg
  Filled 2015-02-02 (×13): qty 1

## 2015-02-02 NOTE — PMR Pre-admission (Signed)
PMR Admission Coordinator Pre-Admission Assessment  Patient: Brian [name] is an 81 y.o., male MRN: [id number] DOB: March 31, 1928 Height:   Weight: 65.8 kg (145 lb 1 oz)              Insurance Information HMO:      PPO:      PCP:      IPA:      80/20:      OTHER:  PRIMARY:  Medicare       Policy#:  [id number]      Subscriber:  self CM Name:        Phone#:      Fax#:  Pre-Cert#:       Employer: retired Benefits:  Phone #:      Name:  Eff. Date: 05/23/1995     Deduct:  $1288      Out of Pocket Max:  no      Life Max:   no CIR:   100%      SNF:  100% first 20 days Outpatient:  80%     Co-Pay:  20% Home Health:  100%      Co-Pay:   DME:  80%     Co-Pay:  20% Providers:  Pt. choice SECONDARY:  Mohammed Kindle      Policy#:  [id number]      Subscriber:  self CM Name:       Phone#:      Fax#:  Pre-Cert#:       Employer:  Benefits:  Phone #:      Name:  Eff. Date:      Deduct:       Out of Pocket Max:       Life Max:  CIR:       SNF:  Outpatient:      Co-Pay:  Home Health:       Co-Pay:  DME:      Co-Pay:   Medicaid Application Date:       Case Manager:  Disability Application Date:       Case Worker:   Emergency Contact Information Contact Information    Name Relation Home Work Mobile   Stough,Billie Spouse [phone]  [phone]   Clifton, Getto [phone]  [phone]     Current Medical History  Patient Admitting Diagnosis: left MCA infarct, embolic with gait, language, swallowing deficits History of Present Illness: Brian [name] is a 81 y.o. right handed male with history of pulmonary fibrosis with home oxygen 2 L with exertion, atrial fibrillation on chronic Coumadin. This was spouse one level home one-step entry independent prior to admission. Presented 01/31/2015 with aphasia and right-sided weakness with facial droop. Cranial CT scan negative. CTA of head and neck showed no large vessel occlusion. INR upon admission of 1.9. MRI showed acute nonhemorrhagic posterior left  MCA territory infarct. Occlusion of distal left posterior MCA branch vessel. Echocardiogram with ejection fraction of 60% no wall motion abnormalities. Patient did not receive TPA. Neurology follow-up Coumadin and transitioned to Eliquis for CVA prophylaxis as well as history of atrial fibrillation. Diet is currently nothing by mouth and nasogastric tube placed with follow-up speech therapy. Maintained on broad-spectrum antibiotics for possible healthcare associated pneumonia with chest x-ray 02/02/2015 showing persistent right upper lobe infiltrate and change to Levaquin 500 milligrams daily 02/02/2015. Patient has needed oxygen 6 L with ambulation with noted history of pulmonary fibrosis oxygen saturations 82%. Physical therapy evaluation completed 02/01/2015 with recommendations of physical medicine rehabilitation consult. Patient was  admitted for comprehensive rehabilitation program Total: 8 [facility]    Past Medical History  Past Medical History  Diagnosis Date  . Muscle tremor     saw neurology remotely elsewhere, was Rx inderal  . Crohn's ileocolitis ([place])   . Osteopenia     per DEXA 11/2008  . Hypertension 11/11    mild  . Hemorrhoids   . Hypertrophy of prostate     w/o UR obst & oth luts  . Peripheral neuropathy ([facility])     h/o neg w/u  . ED (erectile dysfunction)   . Hypertriglyceridemia   . COPD (chronic obstructive pulmonary disease) ([place])     recent dx--no acute problems  . Hiatal hernia 1956    found while in service  . Anxiety   . Atrial fibrillation with RVR ([place])   . Long term (current) use of anticoagulants 02/21/2011  . Serrated adenoma of colon 03/2010  . Renal cyst     bilateral  . Pulmonary fibrosis ([place]) 2015    Rx Esbriet ~ 10-2013 ([name]), Dr. [name]  . On home oxygen therapy     "2L; 20-24h for the past week" (01/26/2015)  . Emphysema lung ([place]) 2016    Dr. [name]  . B12 deficiency anemia   . Idiopathic pulmonary fibrosis ([place]) 2016    Dr. [name]  . Pneumonia  ?2015  . CAP (community acquired pneumonia) 01/26/2015  . Osteoarthritis     "maybe in my hands, feet" (01/26/2015)  . Depression   . Squamous cell carcinoma of skin of scalp     tx'd ~ 75yrs; "froze them off"  . Squamous cell carcinoma, face      "froze them off"  . Melanoma of scalp ([place])     Family History  family history includes Breast cancer in his mother; Crohn's disease in his brother; Dementia in his sister; Diabetes in his sister; Heart attack (age of onset: 67) in his father; Stroke (age of onset: 73) in his mother. There is no history of Colon cancer or Prostate cancer.  Prior Rehab/Hospitalizations:  Has the patient had major surgery during 100 days prior to admission? No  Current Medications   Current facility-administered medications:  .  apixaban (ELIQUIS) tablet 5 mg, 5 mg, Oral, BID, Cassie L Stewart, RPH, 5 mg at 02/01/15 2253 .  azelastine (ASTELIN) 0.1 % nasal spray 1 spray, 1 spray, Each Nare, BID PRN, Melton Alar, PA-C .  busPIRone (BUSPAR) tablet 15 mg, 15 mg, Oral, BID, Melton Alar, PA-C, 15 mg at 02/01/15 2251 .  clonazePAM (KLONOPIN) tablet 0.25 mg, 0.25 mg, Oral, TID PRN, Bobby Rumpf York, PA-C .  feeding supplement (JEVITY 1.2 CAL) liquid 1,000 mL, 1,000 mL, Per Tube, Continuous, Reanne J Barbato, RD .  free water 90 mL, 90 mL, Per Tube, Q4H, Reanne Alvera Novel, RD .  gemfibrozil (LOPID) tablet 600 mg, 600 mg, Oral, BID, Melton Alar, PA-C, 600 mg at 02/01/15 2251 .  guaiFENesin-dextromethorphan (ROBITUSSIN DM) 100-10 MG/5ML syrup 5 mL, 5 mL, Oral, Q4H PRN, Bobby Rumpf York, PA-C .  hydrALAZINE (APRESOLINE) injection 5-10 mg, 5-10 mg, Intravenous, Q4H PRN, Waldemar Dickens, MD .  levofloxacin [facility]) 25 MG/ML solution 500 mg, 500 mg, Per Tube, Daily, Kelvin Cellar, MD .  mesalamine (PENTASA) CR capsule 500 mg, 500 mg, Oral, BID, Melton Alar, PA-C, 500 mg at 02/01/15 2252 .  Pirfenidone CAPS 3 capsule, 3 capsule, Oral, TID WC, Marianne L York,  PA-C, 3 capsule at 02/01/15  1721 .  potassium chloride SA (K-DUR,KLOR-CON) CR tablet 40 mEq, 40 mEq, Oral, Once, Kelvin Cellar, MD .  pravastatin (PRAVACHOL) tablet 10 mg, 10 mg, Oral, q1800, Donzetta Starch, NP, 10 mg at 02/01/15 1722 .  RESOURCE THICKENUP CLEAR, , Oral, PRN, Waldemar Dickens, MD  Patients Current Diet: Diet NPO time specified Diet - low sodium heart healthy  Precautions / Restrictions Precautions Precautions: Fall Restrictions Weight Bearing Restrictions: No   Has the patient had 2 or more falls or a fall with injury in the past year?No  Prior Activity Level Community (5-7x/wk): Pt. and wife are very active and go to the [facility] together 3 x /week.  Pt. walks an average of 1 [place]/2 [place] [facility]    Home Assistive Devices / Equipment Home Assistive Devices/Equipment: Oxygen Home Equipment: None  Prior Device Use: Indicate devices/aids used by the patient prior to current illness, exacerbation or injury? None of the above  Prior Functional Level Prior Function Level of Independence: Independent Comments: Pt wears 02 PRN 2L/min 02. Pt recently d/ced from hospital with diagnosis of PNA.   Pt. still driving at time of admission.  Enjoys woodworking.    Self Care: Did the patient need help bathing, dressing, using the toilet or eating?  Independent  Indoor Mobility: Did the patient need assistance with walking from room to room (with or without device)? Independent  Stairs: Did the patient need assistance with internal or external stairs (with or without device)? Independent  Functional Cognition: Did the patient need help planning regular tasks such as shopping or remembering to take medications? Independent  Current Functional Level Cognition  Arousal/Alertness: Awake/alert Overall Cognitive Status: Impaired/Different from baseline Difficult to assess due to: Impaired communication Orientation Level: Oriented to person Following Commands: Follows one step  commands inconsistently, Follows one step commands with increased time (with gestures and manual cues.) General Comments: Pt able to lift UEs up in air gesturing therapist and kick out LEs imitating therapist.  Attention: Sustained Sustained Attention: Appears intact Awareness: Impaired Awareness Impairment: Intellectual impairment, Emergent impairment Behaviors: Perseveration    Extremity Assessment (includes Sensation/Coordination)  Upper Extremity Assessment: Defer to OT evaluation  Lower Extremity Assessment: Difficult to assess due to impaired cognition (Difficult to assess as pt not able to follow commands to participate in MMT.  Able to left BLES off bed against gravity to donn socks and perform ambulation without knee buckling demonstrating functional strength.)    ADLs       Mobility  Overal bed mobility: Needs Assistance Bed Mobility: Supine to Sit, Sit to Supine Supine to sit: Supervision, HOB elevated Sit to supine: Supervision, HOB elevated General bed mobility comments: Used gesturing to get pt to come to EOB and return to supine with increased time.     Transfers  Overall transfer level: Needs assistance Equipment used: None Transfers: Sit to/from Stand Sit to Stand: Min assist General transfer comment: Min A to boost from EOB. Unsteady upon standing.    Ambulation / Gait / Stairs / Wheelchair Mobility  Ambulation/Gait Ambulation/Gait assistance: Museum/gallery curator (Feet): 100 Feet Assistive device: 1 person hand held assist Gait Pattern/deviations: Step-through pattern, Decreased stride length General Gait Details: Short step lengths. Pt veering towards right side during ambulation; Min A for balance to prevent fall. Not able to follow directional cues. Sp02 decreased to 82% on 6L/min 02.  Gait velocity interpretation: <1.8 ft/sec, indicative of risk for recurrent falls    Posture / Balance  Balance Overall balance assessment: Needs  assistance Sitting-balance support: Feet supported, No upper extremity supported Sitting balance-Leahy Scale: Fair Standing balance support: During functional activity Standing balance-Leahy Scale: Poor Standing balance comment: Relient on external support for static and dynamic standing balance.     Special needs/care consideration BiPAP/CPAP   no CPM  no Continuous Drip IV   no Dialysis  no        Days   Life Vest   no Oxygen    Yes, currently 4L/min nasal cannula Special Bed   no Trach Size   no Wound Vac (area)  no      Location   no Skin   WDL  Per nursing assessment                             Bowel mgmt  Last BM 02/02/15; incontinence, diarrhea Bladder mgmt: uses urinal with assist; continent Diabetic mgmt A1c pending     Previous Home Environment Living Arrangements: Spouse/significant other Available Help at Discharge: Family, Available 24 hours/day Type of Home: House Home Layout: One level Home Access: Level entry [place]: No  Discharge Living Setting Plans for Discharge Living Setting: Patient's home Type of Home at Discharge: House Discharge Home Layout: One level Discharge Home Access: Stairs to enter Entrance Stairs-Rails: None Entrance Stairs-Number of Steps: 1/2 step to enter Discharge Bathroom Shower/Tub: Tub/shower unit, Horticulturist, commercial: Standard Discharge Bathroom Accessibility: Yes How Accessible: Accessible via walker Does the patient have any problems obtaining your medications?: No  Social/Family/Support Systems Patient Roles: Spouse, Parent, Other (Comment) (4 children, 13 grandchildren and 33 great grands) Contact Information: Taytum Lail, wife Anticipated Caregiver: Emaad Dobias Anticipated Caregiver's Contact Information: [phone] Ability/Limitations of Caregiver: no limitations Caregiver Availability: 24/7 Discharge Plan Discussed with Primary Caregiver: Yes Is Caregiver In Agreement with Plan?:  Yes Does Caregiver/Family have Issues with Lodging/Transportation while Pt is in Rehab?: No   Goals/Additional Needs Patient/Family Goal for Rehab: modified independent and supervision PT/OT; supervision, minimum and moderate assist for SLP Expected length of stay: 8-12 days Cultural Considerations: no Dietary Needs: NPO with panda tube Equipment Needs: TBA Additional Information: Today is pt and wife's 61st wedding anniversary Pt/Family Agrees to Admission and willing to participate: Yes Program Orientation Provided & Reviewed with Pt/Caregiver Including Roles  & Responsibilities: Yes   Decrease burden of Care through IP rehab admission: no   Possible need for SNF placement upon discharge:  Not anticipated   Patient Condition: This patient's condition remains as documented in the consult dated 02/02/15 , in which the Rehabilitation Physician determined and documented that the patient's condition is appropriate for intensive rehabilitative care in an inpatient rehabilitation facility. Will admit to inpatient rehab today.  Preadmission Screen Completed By:  Gerlean Ren, 02/02/2015 2:37 PM ______________________________________________________________________   Discussed status with Dr.  [name] on 02/02/15 at  1456  and received telephone approval for admission today.  Admission Coordinator:  Gerlean Ren, time [id number] /Date 02/02/15

## 2015-02-02 NOTE — Consult Note (Signed)
Physical Medicine and Rehabilitation Consult Reason for Consult: Left MCA infarct embolic secondary to known atrial fibrillation Referring Physician: [facility]   HPI: Brian [name] is a 81 y.o. right handed male with history of pulmonary fibrosis with home oxygen 2 L, atrial fibrillation on chronic Coumadin. This was spouse one level home one-step entry independent prior to admission. Presented 01/31/2015 with aphasia and right-sided weakness with facial droop. Cranial CT scan negative. CTA of head and neck showed no large vessel occlusion. INR upon admission of 1.9. 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 Occlusion of distal left posterior MCA branch vessel. Echocardiogram with ejection fraction of 60% no wall motion abnormalities. Patient did not receive TPA. Neurology follow-up Coumadin and transitioned to Eliquis for CVA prophylaxis as well as history of atrial fibrillation. Dysphagia to nectar thick liquids. Maintained on broad-spectrum antibiotics for possible healthcare associated pneumonia. Patient has needed oxygen 6 L with ambulation with noted history of pulmonary fibrosis oxygen saturations 82%. Physical therapy evaluation completed 02/01/2015 with recommendations of physical medicine rehabilitation consult.   Review of Systems  Constitutional: Negative for fever and chills.  HENT: Negative for hearing loss.   Eyes: Negative for blurred vision and double vision.  Respiratory: Negative for cough.        Shortness of breath with exertion  Cardiovascular: Positive for palpitations and leg swelling. Negative for chest pain.  Gastrointestinal: Positive for constipation. Negative for nausea, vomiting and abdominal pain.  Genitourinary: Negative for dysuria and hematuria.  Musculoskeletal: Positive for myalgias and joint pain.  Skin: Negative for rash.  Neurological: Positive for weakness. Negative for seizures, loss of consciousness and headaches.    Psychiatric/Behavioral: Positive for depression.       Anxiety  All other systems reviewed and are negative.  Past Medical History  Diagnosis Date  . Muscle tremor     saw neurology remotely elsewhere, was Rx inderal  . Crohn's ileocolitis ([place])   . Osteopenia     per DEXA 11/2008  . Hypertension 11/11    mild  . Hemorrhoids   . Hypertrophy of prostate     w/o UR obst & oth luts  . Peripheral neuropathy (HCC)     h/o neg w/u  . ED (erectile dysfunction)   . Hypertriglyceridemia   . COPD (chronic obstructive pulmonary disease) ([place])     recent dx--no acute problems  . Hiatal hernia 1956    found while in service  . Anxiety   . Atrial fibrillation with RVR ([place])   . Long term (current) use of anticoagulants 02/21/2011  . Serrated adenoma of colon 03/2010  . Renal cyst     bilateral  . Pulmonary fibrosis ([place]) 2015    Rx Esbriet ~ 10-2013 ([name]), Dr. [name]  . On home oxygen therapy     "2L; 20-24h for the past week" (01/26/2015)  . Emphysema lung ([place]) 2016    Dr. [name]  . B12 deficiency anemia   . Idiopathic pulmonary fibrosis ([place]) 2016    Dr. [name]  . Pneumonia ?2015  . CAP (community acquired pneumonia) 01/26/2015  . Osteoarthritis     "maybe in my hands, feet" (01/26/2015)  . Depression   . Squamous cell carcinoma of skin of scalp     tx'd ~ 30yrs; "froze them off"  . Squamous cell carcinoma, face      "froze them off"  . Melanoma of scalp [facility])    Past Surgical History  Procedure Laterality Date  .  Colon resection  1984    resection of distal ileum and cecum w/ appendectomy, 18-inch small intestine, for Crohn's disease  . Colon surgery    . Appendectomy  1984  . Cataract extraction w/ intraocular lens  implant, bilateral Bilateral 2007  . Cholecystectomy  02/17/2011    Procedure: LAPAROSCOPIC CHOLECYSTECTOMY WITH INTRAOPERATIVE CHOLANGIOGRAM;  Surgeon: Edward Jolly, MD;  Location: [facility];  Service: General;  Laterality: N/A;  . Mohs surgery Right  ~ 2014    "side of my scalp"   Family History  Problem Relation Age of Onset  . Crohn's disease Brother   . Breast cancer Mother   . Colon cancer Neg Hx   . Prostate cancer Neg Hx   . Heart attack Father 42  . Dementia Sister   . Diabetes Sister   . Stroke Mother 94   Social History:  reports that he quit smoking about 24 years ago. His smoking use included Cigarettes and Pipe. He has a 40 pack-year smoking history. He has never used smokeless tobacco. He reports that he does not drink alcohol or use illicit drugs. Allergies: No Known Allergies Medications Prior to Admission  Medication Sig Dispense Refill  . acetaminophen (TYLENOL) 325 MG tablet Take 650 mg by mouth as needed for pain.    Marland Kitchen amLODipine (NORVASC) 5 MG tablet Take 1 tablet (5 mg total) by mouth daily. 90 tablet 3  . amoxicillin-clavulanate (AUGMENTIN) 875-125 MG tablet Take 1 tablet by mouth 2 (two) times daily. 16 tablet 0  . azelastine (ASTELIN) 0.1 % nasal spray Place 1 spray into both nostrils 2 (two) times daily as needed for allergies. Use in each nostril as directed    . busPIRone (BUSPAR) 15 MG tablet Take 1 tablet (15 mg total) by mouth 2 (two) times daily. 180 tablet 0  . calcium citrate (CALCITRATE - DOSED IN MG ELEMENTAL CALCIUM) 950 MG tablet Take 200 mg of elemental calcium by mouth daily. Pt take QOD    . clonazePAM (KLONOPIN) 0.5 MG tablet Take 0.5 tablets (0.25 mg total) by mouth 3 (three) times daily as needed for anxiety. (Patient taking differently: Take 0.125 mg by mouth 3 (three) times daily as needed for anxiety. ) 40 tablet 0  . Cyanocobalamin (NASCOBAL) 500 MCG/0.1ML SOLN Use one spray nasally per week as directed. (Patient taking differently: Place 0.1 mLs into the nose every 7 (seven) days. Use one spray nasally per week as directed.) 3 Bottle 2  . famotidine (PEPCID) 40 MG tablet Take 1 tablet (40 mg total) by mouth daily. 30 tablet 0  . gemfibrozil (LOPID) 600 MG tablet Take 1 tablet (600 mg  total) by mouth 2 (two) times daily. 180 tablet 3  . mesalamine (PENTASA) 250 MG CR capsule Take 2 capsules (500 mg total) by mouth 2 (two) times daily. 360 capsule 3  . nebivolol (BYSTOLIC) 5 MG tablet Take 1 tablet (5 mg total) by mouth daily. 90 tablet 0  . Pirfenidone 267 MG CAPS Take 3 capsules by mouth 3 (three) times daily.    . predniSONE (DELTASONE) 20 MG tablet Take 2 tablets by mouth for 2 days; then 1 tablet by mouth for 3 days; then half tablet by mouth daily for 3 days and stop prednisone. 10 tablet 0  . Vitamin D, Ergocalciferol, (DRISDOL) 50000 UNITS CAPS capsule Take 50,000 Units by mouth every 7 (seven) days.    Marland Kitchen warfarin (COUMADIN) 2 MG tablet Take 1 tablet (2 mg total) by mouth as directed. (  Patient taking differently: Take 3-4 mg by mouth daily. Take 2 tablets on Monday, Wednesday, & Friday Take 1.5 tablets on all other days) 150 tablet 0  . guaiFENesin-dextromethorphan (ROBITUSSIN DM) 100-10 MG/5ML syrup Take 5 mLs by mouth every 4 (four) hours as needed for cough. 236 mL 0  . OXYGEN Inhale into the lungs. 2 liters    . psyllium (METAMUCIL SMOOTH TEXTURE) 28 % packet Take 1 packet by mouth daily as needed. For constipation      Home: Home Living Family/patient expects to be discharged to:: Inpatient rehab Living Arrangements: Spouse/significant other Available Help at Discharge: Family, Available 24 hours/day Type of Home: House Home Access: Level entry Home Layout: One level Home Equipment: None  Functional History: Prior Function Level of Independence: Independent Comments: Pt wears 02 PRN 2L/min 02. Pt recently d/ced from hospital with diagnosis of PNA.  Functional Status:  Mobility: Bed Mobility Overal bed mobility: Needs Assistance Bed Mobility: Supine to Sit, Sit to Supine Supine to sit: Supervision, HOB elevated Sit to supine: Supervision, HOB elevated General bed mobility comments: Used gesturing to get pt to come to EOB and return to supine with  increased time.  Transfers Overall transfer level: Needs assistance Equipment used: None Transfers: Sit to/from Stand Sit to Stand: Min assist General transfer comment: Min A to boost from EOB. Unsteady upon standing. Ambulation/Gait Ambulation/Gait assistance: Min assist Ambulation Distance (Feet): 100 Feet Assistive device: 1 person hand held assist Gait Pattern/deviations: Step-through pattern, Decreased stride length General Gait Details: Short step lengths. Pt veering towards right side during ambulation; Min A for balance to prevent fall. Not able to follow directional cues. Sp02 decreased to 82% on 6L/min 02.  Gait velocity interpretation: <1.8 ft/sec, indicative of risk for recurrent falls    ADL:    Cognition: Cognition Overall Cognitive Status: Impaired/Different from baseline Arousal/Alertness: Awake/alert Orientation Level: Oriented to person Attention: Sustained Sustained Attention: Appears intact Awareness: Impaired Awareness Impairment: Intellectual impairment, Emergent impairment Behaviors: Perseveration Cognition Arousal/Alertness: Awake/alert Behavior During Therapy: WFL for tasks assessed/performed Overall Cognitive Status: Impaired/Different from baseline Area of Impairment: Following commands Following Commands: Follows one step commands inconsistently, Follows one step commands with increased time (with gestures and manual cues.) General Comments: Pt able to lift UEs up in air gesturing therapist and kick out LEs imitating therapist.  Difficult to assess due to: Impaired communication  Blood pressure 129/76, pulse 92, temperature 98.3 F (36.8 C), temperature source Oral, resp. rate 20, weight 65.8 kg (145 lb 1 oz), SpO2 96 %. Physical Exam  Constitutional: He appears well-developed.  HENT:  Head: Normocephalic.  Eyes: EOM are normal.  Neck: Normal range of motion. Neck supple. No thyromegaly present.  Cardiovascular:  Cardiac rate controlled   Respiratory: Effort normal and breath sounds normal.  GI: Soft. Bowel sounds are normal. He exhibits no distension.  Neurological: He is alert.  Patient makes good eye contact with examiner. He is aphasic but able to provide some spontaneous words. Expressive greater than receptive aphasia. Tends to perseverate on certain activities/ideas. Moves bilateral UE 4/5 prox to disal. LE: 3+ to 4/5 prox to distal. Coughed after swallowing water in the room  Skin: Skin is warm and dry.    Results for orders placed or performed during the hospital encounter of 01/31/15 (from the past 24 hour(s))  Lipid panel     Status: Abnormal   Collection Time: 02/01/15 10:50 AM  Result Value Ref Range   Cholesterol 103 0 - 200 mg/dL  Triglycerides 72 <150 mg/dL   HDL 33 (L) >40 mg/dL   Total CHOL/HDL Ratio 3.1 RATIO   VLDL 14 0 - 40 mg/dL   LDL Cholesterol 56 0 - 99 mg/dL   Ct Angio Head W/cm &/or Wo Cm  01/31/2015  CLINICAL DATA:  Right facial droop, right-sided weakness, and slurred speech. EXAM: CT ANGIOGRAPHY HEAD AND NECK TECHNIQUE: Multidetector CT imaging of the head and neck was performed using the standard protocol during bolus administration of intravenous contrast. Multiplanar CT image reconstructions and MIPs were obtained to evaluate the vascular anatomy. Carotid stenosis measurements (when applicable) are obtained utilizing NASCET criteria, using the distal internal carotid diameter as the denominator. CONTRAST:  75mL OMNIPAQUE IOHEXOL 350 MG/ML SOLN COMPARISON:  Noncontrast head CT earlier today. Chest radiographs 01/26/2015. FINDINGS: CTA NECK Aortic arch: 3 vessel aortic arch with mild atherosclerotic plaque. Brachiocephalic and subclavian arteries are patent without significant stenosis. Right carotid system: Patent with minimal non stenotic plaque at the carotid bifurcation. Left carotid system: Patent without stenosis or significant atherosclerosis. Vertebral arteries: Patent without significant  stenosis. Left vertebral artery is mildly dominant. Skeleton: Moderate multilevel cervical disc and facet degeneration. Other neck: Extensive consolidation is partially visualized in the right upper lobe as seen on recent chest radiographs, with areas of cavitation. This is superimposed on a background of advanced centrilobular emphysema. There are multiple enlarged upper mediastinal lymph nodes predominantly in the right paratracheal region which measure up to 1.2 cm in short axis. CTA HEAD Anterior circulation: Internal carotid arteries are patent from skullbase to carotid termini with minimal atherosclerotic calcification but no stenosis. There is a 2 mm left posterior communicating artery region infundibulum versus aneurysm. M1 segments are widely patent. MCA bifurcations are patent. There is occlusion of a left M2 inferior division branch vessel approximately 1.5 cm distal to the MCA bifurcation (series 9, image 88 and series 11, image 117). ACAs are patent without significant stenosis. Posterior circulation: Intracranial vertebral arteries are patent with the left being dominant. Right vertebral artery is markedly hypoplastic distal to the PICA origin. SCA origins are patent. Basilar artery is diffusely small in caliber on a developmental basis without significant superimposed focal stenosis. There is a fetal origin of the right PCA. PCAs are patent without evidence of significant stenosis. Venous sinuses: Patent. Anatomic variants: Fetal origin of the right PCA. IMPRESSION: 1. No large vessel occlusion. 2. Left M2 MCA branch vessel occlusion. 3. No cervical carotid or vertebral artery stenosis. 4. 2 mm left posterior communicating artery infundibulum versus aneurysm. 5. Extensive right upper lobe lung consolidation with areas of cavitation, incompletely visualized. Mild mediastinal lymphadenopathy. These results were called by telephone at the time of interpretation on 01/31/2015 at 10:30 am to [place]  , who verbally acknowledged these results. Electronically Signed   By: Logan Bores M.D.   On: 01/31/2015 10:35   Ct Head Wo Contrast  01/31/2015  CLINICAL DATA:  Code stroke. Right-sided weakness with facial droop and slurred speech EXAM: CT HEAD WITHOUT CONTRAST TECHNIQUE: Contiguous axial images were obtained from the base of the skull through the vertex without intravenous contrast. COMPARISON:  None. FINDINGS: Ventricle size normal.  Cerebral volume normal. Mild patchy hypodensity in the cerebral white matter bilaterally, most consistent with ischemia of chronic duration. Negative for acute infarct. Negative for hemorrhage or mass. Mild calcification in the basal ganglia bilaterally. Normal arterial density. Calvarium intact. IMPRESSION: Mild chronic microvascular ischemia.  No acute abnormality. Critical Value/emergent results were called by telephone  at the time of interpretation on 01/31/2015 at 9:39 am to Dr. Armida Sans, who verbally acknowledged these results. Electronically Signed   By: Franchot Gallo M.D.   On: 01/31/2015 09:39   Ct Angio Neck W/cm &/or Wo/cm  01/31/2015  CLINICAL DATA:  Right facial droop, right-sided weakness, and slurred speech. EXAM: CT ANGIOGRAPHY HEAD AND NECK TECHNIQUE: Multidetector CT imaging of the head and neck was performed using the standard protocol during bolus administration of intravenous contrast. Multiplanar CT image reconstructions and MIPs were obtained to evaluate the vascular anatomy. Carotid stenosis measurements (when applicable) are obtained utilizing NASCET criteria, using the distal internal carotid diameter as the denominator. CONTRAST:  56mL OMNIPAQUE IOHEXOL 350 MG/ML SOLN COMPARISON:  Noncontrast head CT earlier today. Chest radiographs 01/26/2015. FINDINGS: CTA NECK Aortic arch: 3 vessel aortic arch with mild atherosclerotic plaque. Brachiocephalic and subclavian arteries are patent without significant stenosis. Right carotid system: Patent with minimal  non stenotic plaque at the carotid bifurcation. Left carotid system: Patent without stenosis or significant atherosclerosis. Vertebral arteries: Patent without significant stenosis. Left vertebral artery is mildly dominant. Skeleton: Moderate multilevel cervical disc and facet degeneration. Other neck: Extensive consolidation is partially visualized in the right upper lobe as seen on recent chest radiographs, with areas of cavitation. This is superimposed on a background of advanced centrilobular emphysema. There are multiple enlarged upper mediastinal lymph nodes predominantly in the right paratracheal region which measure up to 1.2 cm in short axis. CTA HEAD Anterior circulation: Internal carotid arteries are patent from skullbase to carotid termini with minimal atherosclerotic calcification but no stenosis. There is a 2 mm left posterior communicating artery region infundibulum versus aneurysm. M1 segments are widely patent. MCA bifurcations are patent. There is occlusion of a left M2 inferior division branch vessel approximately 1.5 cm distal to the MCA bifurcation (series 9, image 88 and series 11, image 117). ACAs are patent without significant stenosis. Posterior circulation: Intracranial vertebral arteries are patent with the left being dominant. Right vertebral artery is markedly hypoplastic distal to the PICA origin. SCA origins are patent. Basilar artery is diffusely small in caliber on a developmental basis without significant superimposed focal stenosis. There is a fetal origin of the right PCA. PCAs are patent without evidence of significant stenosis. Venous sinuses: Patent. Anatomic variants: Fetal origin of the right PCA. IMPRESSION: 1. No large vessel occlusion. 2. Left M2 MCA branch vessel occlusion. 3. No cervical carotid or vertebral artery stenosis. 4. 2 mm left posterior communicating artery infundibulum versus aneurysm. 5. Extensive right upper lobe lung consolidation with areas of cavitation,  incompletely visualized. Mild mediastinal lymphadenopathy. These results were called by telephone at the time of interpretation on 01/31/2015 at 10:30 am to [place] , who verbally acknowledged these results. Electronically Signed   By: Logan Bores M.D.   On: 01/31/2015 10:35   Mr Jodene Nam Head Wo Contrast  01/31/2015  CLINICAL DATA:  Since change in speech at 8 o\'clock a.m. Right arm weakness. Right facial droop. A aphasia. Right arm weakness has improved. EXAM: MRI HEAD WITHOUT CONTRAST MRA HEAD WITHOUT CONTRAST TECHNIQUE: Multiplanar, multiecho pulse sequences of the brain and surrounding structures were obtained without intravenous contrast. Angiographic images of the head were obtained using MRA technique without contrast. COMPARISON:  CT head without contrast in CTA head and neck from the same day. FINDINGS: MRI HEAD FINDINGS Acute nonhemorrhagic posterior left MCA territory infarct is confirmed. This involves the left sylvian fissure extends superiorly within the left parietal lobe. There  is at least 1 additional punctate cortical infarct in the left parietal lobe on image 31 of series 7. T2 changes are evident within the area of acute infarction. Additional subcortical and periventricular T2 changes are evident bilaterally. Flow is present in the major intracranial arteries. Bilateral lens replacements are present. The paranasal sinuses mastoid air cells are clear. MRA HEAD FINDINGS Internal carotid arteries are within normal limits from the high cervical segments through the ICA termini bilaterally. The A1 and M1 segments are normal. Anterior communicating artery is patent. ACA branch vessels are within normal limits. The MCA bifurcations are intact bilaterally. Right MCA branch vessels are normal. A distal posterior left MCA branch vessel occlusion is noted. One additional left M3 branch vessels opacified compared to the earlier CTA. The left vertebral artery is the dominant vessel. The right PICA  origin is visualized and normal. AICA vessels are noted bilaterally. The left posterior cerebral artery originates from the basilar tip. The right posterior cerebral artery originates from the basilar tip and a posterior communicating artery. The PCA branch vessels are intact. IMPRESSION: 1. Acute nonhemorrhagic posterior left MCA territory infarct is confirmed. 2. Occlusion of distal left posterior MCA branch vessel. There is opacification of an additional left MCA branch vessels since the earlier CTA study. 3. Extensive white matter disease is present in addition to the acute infarct. Electronically Signed   By: San Morelle M.D.   On: 01/31/2015 16:09   Mr Brain Wo Contrast  01/31/2015  CLINICAL DATA:  Since change in speech at 8 o\'clock a.m. Right arm weakness. Right facial droop. A aphasia. Right arm weakness has improved. EXAM: MRI HEAD WITHOUT CONTRAST MRA HEAD WITHOUT CONTRAST TECHNIQUE: Multiplanar, multiecho pulse sequences of the brain and surrounding structures were obtained without intravenous contrast. Angiographic images of the head were obtained using MRA technique without contrast. COMPARISON:  CT head without contrast in CTA head and neck from the same day. FINDINGS: MRI HEAD FINDINGS Acute nonhemorrhagic posterior left MCA territory infarct is confirmed. This involves the left sylvian fissure extends superiorly within the left parietal lobe. There is at least 1 additional punctate cortical infarct in the left parietal lobe on image 31 of series 7. T2 changes are evident within the area of acute infarction. Additional subcortical and periventricular T2 changes are evident bilaterally. Flow is present in the major intracranial arteries. Bilateral lens replacements are present. The paranasal sinuses mastoid air cells are clear. MRA HEAD FINDINGS Internal carotid arteries are within normal limits from the high cervical segments through the ICA termini bilaterally. The A1 and M1 segments  are normal. Anterior communicating artery is patent. ACA branch vessels are within normal limits. The MCA bifurcations are intact bilaterally. Right MCA branch vessels are normal. A distal posterior left MCA branch vessel occlusion is noted. One additional left M3 branch vessels opacified compared to the earlier CTA. The left vertebral artery is the dominant vessel. The right PICA origin is visualized and normal. AICA vessels are noted bilaterally. The left posterior cerebral artery originates from the basilar tip. The right posterior cerebral artery originates from the basilar tip and a posterior communicating artery. The PCA branch vessels are intact. IMPRESSION: 1. Acute nonhemorrhagic posterior left MCA territory infarct is confirmed. 2. Occlusion of distal left posterior MCA branch vessel. There is opacification of an additional left MCA branch vessels since the earlier CTA study. 3. Extensive white matter disease is present in addition to the acute infarct. Electronically Signed   By: San Morelle  M.D.   On: 01/31/2015 16:09   Dg Chest Port 1 View  01/31/2015  CLINICAL DATA:  Pneumonia and call EXAM: PORTABLE CHEST 1 VIEW COMPARISON:  Radiograph 01/26/2015 FINDINGS: Normal cardiac silhouette. There is airspace opacity in the RIGHT upper lobe which is increased slightly in density. Mild airspace disease in the RIGHT lower lobe appears chronic. Underlying severe emphysematous change in the RIGHT upper lobe. IMPRESSION: Slight worsening of RIGHT upper lobe pneumonia superimposed on emphysematous change. Electronically Signed   By: Suzy Bouchard M.D.   On: 01/31/2015 11:42   Dg Swallowing Func-speech Pathology  02/01/2015  Objective Swallowing Evaluation:   Patient Details Name: Brian [name] MRN: [id number] Date of Birth: 1928/08/07 Today's Date: 02/01/2015 Time: SLP Start Time (ACUTE ONLY): 0945-SLP Stop Time (ACUTE ONLY): 1010 SLP Time Calculation (min) (ACUTE ONLY): 25 min Past Medical History:  Past Medical History Diagnosis Date . Muscle tremor    saw neurology remotely elsewhere, was Rx inderal . Crohn's ileocolitis ([place])  . Osteopenia    per DEXA 11/2008 . Hypertension 11/11   mild . Hemorrhoids  . Hypertrophy of prostate    w/o UR obst & oth luts . Peripheral neuropathy ([facility]CC)    h/o neg w/u . ED (erectile dysfunction)  . Hypertriglyceridemia  . COPD (chronic obstructive pulmonary disease) ([place])    recent dx--no acute problems . Hiatal hernia 1956   found while in service . Anxiety  . Atrial fibrillation with RVR ([place])  . Long term (current) use of anticoagulants 02/21/2011 . Serrated adenoma of colon 03/2010 . Renal cyst    bilateral . Pulmonary fibrosis ([place]) 2015   Rx Esbriet ~ 10-2013 ([name]), Dr. [name] . On home oxygen therapy    "2L; 20-24h for the past week" (01/26/2015) . Emphysema lung ([place]) 2016   Dr. [name] . B12 deficiency anemia  . Idiopathic pulmonary fibrosis ([place]) 2016   Dr. [name] . Pneumonia ?2015 . CAP (community acquired pneumonia) 01/26/2015 . Osteoarthritis    "maybe in my hands, feet" (01/26/2015) . Depression  . Squamous cell carcinoma of skin of scalp    tx'd ~ 58yrs; "froze them off" . Squamous cell carcinoma, face     "froze them off" . Melanoma of scalp [facility])  Past Surgical History: Past Surgical History Procedure Laterality Date . Colon resection  1984   resection of distal ileum and cecum w/ appendectomy, 18-inch small intestine, for Crohn's disease . Colon surgery   . Appendectomy  1984 . Cataract extraction w/ intraocular lens  implant, bilateral Bilateral 2007 . Cholecystectomy  02/17/2011   Procedure: LAPAROSCOPIC CHOLECYSTECTOMY WITH INTRAOPERATIVE CHOLANGIOGRAM;  Surgeon: Edward Jolly, MD;  Location: [facility];  Service: General;  Laterality: N/A; . Mohs surgery Right ~ 2014   "side of my scalp" HPI: 81 y.o. male with pulmonary fibrosis, atrial fibrillation, Chron's, HTN, COPD, hiatal hernia and pna 01/26/15 admitted with right side weakness and inability to speak. CT  negative, MRI showed acute nonhemorrhagic posterior left MCA territory infarct. CXR Slight worsening of RIGHT upper lobe pneumonia superimposed on emphysematous change. BSE 11/4 without difficulty and regular/thin recommended. Subjective: pleasant, talkative Assessment / Plan / Recommendation CHL IP CLINICAL IMPRESSIONS 02/01/2015 Therapy Diagnosis Severe pharyngeal phase dysphagia Clinical Impression Pt demonstrated severe oropharyngeal neuromuscular dysphagia characterized by sensorimotor impariment. Pt has adequate oral movement, however due reduced sensation and muscular control results in premature spillage. Pt's pharyngeal phase is characterized by reduced hyolaryngeal elevation resulting in reduced epiglottic deflection and delayed swallow initation to the valleculae  for puree and pyriforms for liquids, which results in unsensed penetration/aspiration of liquids. Pt did not sense aspiration until after it passed through his vocal cords.  Pt has significant pharyngeal residue across all consistences. Pt's global aphasia interferes with compensatory strategies and max verbal, tactile, and visual cues for second swallow to clear residue. While a chin tuck with mod-max assist reduced vallecular residue with thin liquids, SLP recommends pt remain NPO due to high risk of aspiration and risk of futher respiratory distress; pt has no functional reserve due to baseline pulmonary fibrosis and right upper lobe pneumonia prior to stroke.  Pt will benefit from CIR for intensive dysphagia and aphasia treatment. SLP will f/u with diagnostic assessment and potential repeat of objective assessment if swallow function has improved.    CHL IP TREATMENT RECOMMENDATION 02/01/2015 Treatment Recommendations Therapy as outlined in treatment plan below   [facility] IP DIET RECOMMENDATION 02/01/2015 SLP Diet Recommendations NPO;Alternative means - temporary Liquid Administration via -- Medication Administration Via alternative means  Compensations -- Postural Changes --   CHL IP OTHER RECOMMENDATIONS 02/01/2015 Recommended Consults -- Oral Care Recommendations Oral care QID Other Recommendations --   CHL IP FOLLOW UP RECOMMENDATIONS 02/01/2015 Follow up Recommendations Inpatient Rehab   CHL IP FREQUENCY AND DURATION 02/01/2015 Speech Therapy Frequency (ACUTE ONLY) min 3x week Treatment Duration --      CHL IP ORAL PHASE 02/01/2015 Oral Phase Impaired Oral - Pudding Teaspoon -- Oral - Pudding Cup -- Oral - Honey Teaspoon -- Oral - Honey Cup -- Oral - Nectar Teaspoon -- Oral - Nectar Cup Delayed oral transit;Premature spillage Oral - Nectar Straw Delayed oral transit;Premature spillage Oral - Thin Teaspoon -- Oral - Thin Cup Delayed oral transit;Premature spillage Oral - Thin Straw Delayed oral transit;Premature spillage Oral - Puree Delayed oral transit;Premature spillage Oral - Mech Soft -- Oral - Regular -- Oral - Multi-Consistency -- Oral - Pill -- Oral Phase - Comment --  CHL IP PHARYNGEAL PHASE 02/01/2015 Pharyngeal Phase Nectar;Thin;Solids Pharyngeal- Pudding Teaspoon -- Pharyngeal -- Pharyngeal- Pudding Cup -- Pharyngeal -- Pharyngeal- Honey Teaspoon -- Pharyngeal -- Pharyngeal- Honey Cup -- Pharyngeal -- Pharyngeal- Nectar Teaspoon NT Pharyngeal -- Pharyngeal- Nectar Cup Delayed swallow initiation-vallecula;Delayed swallow initiation-pyriform sinuses;Reduced epiglottic inversion;Penetration/Aspiration during swallow;Penetration/Aspiration before swallow;Trace aspiration;Pharyngeal residue - valleculae;Pharyngeal residue - pyriform Pharyngeal Material enters airway, remains ABOVE vocal cords and not ejected out Pharyngeal- Nectar Straw Delayed swallow initiation-vallecula;Delayed swallow initiation-pyriform sinuses;Reduced epiglottic inversion;Penetration/Aspiration during swallow;Penetration/Apiration after swallow;Trace aspiration;Pharyngeal residue - valleculae;Pharyngeal residue - pyriform Pharyngeal Material enters airway, remains  ABOVE vocal cords and not ejected out Pharyngeal- Thin Teaspoon NT Pharyngeal -- Pharyngeal- Thin Cup Delayed swallow initiation-vallecula;Delayed swallow initiation-pyriform sinuses;Reduced epiglottic inversion;Penetration/Aspiration during swallow;Penetration/Apiration after swallow;Trace aspiration;Pharyngeal residue - valleculae;Pharyngeal residue - pyriform Pharyngeal Material enters airway, passes BELOW cords without attempt by patient to eject out (silent aspiration);Material enters airway, passes BELOW cords and not ejected out despite cough attempt by patient Pharyngeal- Thin Straw Delayed swallow initiation-vallecula;Delayed swallow initiation-pyriform sinuses;Reduced epiglottic inversion;Penetration/Aspiration during swallow;Penetration/Apiration after swallow;Trace aspiration;Pharyngeal residue - valleculae;Pharyngeal residue - pyriform Pharyngeal Material enters airway, passes BELOW cords without attempt by patient to eject out (silent aspiration);Material enters airway, passes BELOW cords and not ejected out despite cough attempt by patient Pharyngeal- Puree Delayed swallow initiation-vallecula;Reduced epiglottic inversion;Pharyngeal residue - valleculae;Pharyngeal residue - pyriform Pharyngeal -- Pharyngeal- Mechanical Soft NT Pharyngeal -- Pharyngeal- Regular NT Pharyngeal -- Pharyngeal- Multi-consistency NT Pharyngeal -- Pharyngeal- Pill NT Pharyngeal -- Pharyngeal Comment --  CHL IP CERVICAL ESOPHAGEAL PHASE 02/01/2015 Cervical Esophageal Phase  WFL Pudding Teaspoon -- Pudding Cup -- Honey Teaspoon -- Honey Cup -- Nectar Teaspoon -- Nectar Cup -- Nectar Straw -- Thin Teaspoon -- Thin Cup -- Thin Straw -- Puree -- Mechanical Soft -- Regular -- Multi-consistency -- Pill -- Cervical Esophageal Comment -- Completed by Lanier Ensign, SLP Student Supervised and reviewed by Herbie Baltimore MA CCC-SLP [place], Katherene Ponto 02/01/2015, 2:43 PM               Assessment/Plan: Diagnosis: left MCA  infarct, embolic with gait, language, swallowing deficits 1. Does the need for close, 24 hr/day medical supervision in concert with the patient's rehab needs make it unreasonable for this patient to be served in a less intensive setting? Yes 2. Co-Morbidities requiring supervision/potential complications: pulmonary fibrosis, pneumonia, new diarrheag, afib 3. Due to bladder management, bowel management, safety, skin/wound care, disease management, medication administration, pain management and patient education, does the patient require 24 hr/day rehab nursing? Yes 4. Does the patient require coordinated care of a physician, rehab nurse, PT (1-2 hrs/day, 5 days/week), OT (1-2 hrs/day, 5 days/week) and SLP (1-2 hrs/day, 5 days/week) to address physical and functional deficits in the context of the above medical diagnosis(es)? Yes Addressing deficits in the following areas: balance, endurance, locomotion, strength, transferring, bowel/bladder control, bathing, dressing, feeding, grooming, toileting, cognition, speech, language, swallowing and psychosocial support 5. Can the patient actively participate in an intensive therapy program of at least 3 hrs of therapy per day at least 5 days per week? Yes 6. The potential for patient to make measurable gains while on inpatient rehab is excellent 7. Anticipated functional outcomes upon discharge from inpatient rehab are modified independent and supervision  with PT, modified independent and supervision with OT, supervision, min assist and mod assist with SLP. 8. Estimated rehab length of stay to reach the above functional goals is: 8-12 days 9. Does the patient have adequate social supports and living environment to accommodate these discharge functional goals? Yes 10. Anticipated D/C setting: Home 11. Anticipated post D/C treatments: HH therapy and Outpatient therapy 12. Overall Rehab/Functional Prognosis: excellent  RECOMMENDATIONS: This patient's condition  is appropriate for continued rehabilitative care in the following setting: CIR Patient has agreed to participate in recommended program. Potentially Note that insurance prior authorization may be required for reimbursement for recommended care.  Comment: Pt with severe aphasia and dysphagia. He is at substantial risk for aspiration per SLP who I spoke with this morning due to profound pharyngeal motor/sensory deficits. Probably will need to be NPO with panda for time being as he has little pulmonary reserve given his PF. Would benefit from our intensive program given his complex language as they impact his mobility and self-care activities. Rehab Admissions Coordinator to follow up.  Thanks,  Meredith Staggers, MD, Mellody Drown     02/02/2015

## 2015-02-02 NOTE — H&P (View-Only) (Signed)
Physical Medicine and Rehabilitation Admission H&P    Chief Complaint  Patient presents with  . Code Stroke  : HPI: Brian [name] is a 81 y.o. right handed male with history of pulmonary fibrosis with home oxygen 2 L with exertion, atrial fibrillation on chronic Coumadin. This was spouse one level home one-step entry independent prior to admission. Presented 01/31/2015 with aphasia and right-sided weakness with facial droop. Cranial CT scan negative. CTA of head and neck showed no large vessel occlusion. INR upon admission of 1.9. 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 Occlusion of distal left posterior MCA branch vessel. Echocardiogram with ejection fraction of 60% no wall motion abnormalities. Patient did not receive TPA. Neurology follow-up Coumadin and transitioned to Eliquis for CVA prophylaxis as well as history of atrial fibrillation. Diet is currently nothing by mouth and nasogastric tube placed with follow-up speech therapy. Maintained on broad-spectrum antibiotics for possible healthcare associated pneumonia with chest x-ray 02/02/2015 showing persistent right upper lobe infiltrate and change to Levaquin 500 milligrams daily 02/02/2015. Patient has needed oxygen 6 L with ambulation with noted history of pulmonary fibrosis oxygen saturations 82%. Physical therapy evaluation completed 02/01/2015 with recommendations of physical medicine rehabilitation consult. Patient was admitted for comprehensive rehabilitation program  ROS Constitutional: Negative for fever and chills.  HENT: Negative for hearing loss.  Eyes: Negative for blurred vision and double vision.  Respiratory: Negative for cough.   Shortness of breath with exertion  Cardiovascular: Positive for palpitations and leg swelling. Negative for chest pain.  Gastrointestinal: Positive for constipation. Negative for nausea, vomiting and abdominal pain.  Genitourinary: Negative for dysuria and  hematuria.  Musculoskeletal: Positive for myalgias and joint pain.  Skin: Negative for rash.  Neurological: Positive for weakness. Negative for seizures, loss of consciousness and headaches.  Psychiatric/Behavioral: Positive for depression.   Anxiety  All other systems reviewed and are negative    Past Medical History  Diagnosis Date  . Muscle tremor     saw neurology remotely elsewhere, was Rx inderal  . Crohn's ileocolitis ([place])   . Osteopenia     per DEXA 11/2008  . Hypertension 11/11    mild  . Hemorrhoids   . Hypertrophy of prostate     w/o UR obst & oth luts  . Peripheral neuropathy (HCC)     h/o neg w/u  . ED (erectile dysfunction)   . Hypertriglyceridemia   . COPD (chronic obstructive pulmonary disease) ([place])     recent dx--no acute problems  . Hiatal hernia 1956    found while in service  . Anxiety   . Atrial fibrillation with RVR ([place])   . Long term (current) use of anticoagulants 02/21/2011  . Serrated adenoma of colon 03/2010  . Renal cyst     bilateral  . Pulmonary fibrosis ([place]) 2015    Rx Esbriet ~ 10-2013 ([name]), Dr. [name]  . On home oxygen therapy     "2L; 20-24h for the past week" (01/26/2015)  . Emphysema lung ([place]) 2016    Dr. [name]  . B12 deficiency anemia   . Idiopathic pulmonary fibrosis ([place]) 2016    Dr. [name]  . Pneumonia ?2015  . CAP (community acquired pneumonia) 01/26/2015  . Osteoarthritis     "maybe in my hands, feet" (01/26/2015)  . Depression   . Squamous cell carcinoma of skin of scalp     tx'd ~ 39yrs; "froze them off"  . Squamous cell carcinoma, face      "  froze them off"  . Melanoma of scalp [facility])    Past Surgical History  Procedure Laterality Date  . Colon resection  1984    resection of distal ileum and cecum w/ appendectomy, 18-inch small intestine, for Crohn's disease  . Colon surgery    . Appendectomy  1984  . Cataract extraction w/ intraocular lens  implant, bilateral Bilateral 2007  . Cholecystectomy   02/17/2011    Procedure: LAPAROSCOPIC CHOLECYSTECTOMY WITH INTRAOPERATIVE CHOLANGIOGRAM;  Surgeon: Edward Jolly, MD;  Location: [facility];  Service: General;  Laterality: N/A;  . Mohs surgery Right ~ 2014    "side of my scalp"   Family History  Problem Relation Age of Onset  . Crohn's disease Brother   . Breast cancer Mother   . Colon cancer Neg Hx   . Prostate cancer Neg Hx   . Heart attack Father 56  . Dementia Sister   . Diabetes Sister   . Stroke Mother 37   Social History:  reports that he quit smoking about 24 years ago. His smoking use included Cigarettes and Pipe. He has a 40 pack-year smoking history. He has never used smokeless tobacco. He reports that he does not drink alcohol or use illicit drugs. Allergies: No Known Allergies Medications Prior to Admission  Medication Sig Dispense Refill  . acetaminophen (TYLENOL) 325 MG tablet Take 650 mg by mouth as needed for pain.    Marland Kitchen amLODipine (NORVASC) 5 MG tablet Take 1 tablet (5 mg total) by mouth daily. 90 tablet 3  . amoxicillin-clavulanate (AUGMENTIN) 875-125 MG tablet Take 1 tablet by mouth 2 (two) times daily. 16 tablet 0  . azelastine (ASTELIN) 0.1 % nasal spray Place 1 spray into both nostrils 2 (two) times daily as needed for allergies. Use in each nostril as directed    . busPIRone (BUSPAR) 15 MG tablet Take 1 tablet (15 mg total) by mouth 2 (two) times daily. 180 tablet 0  . calcium citrate (CALCITRATE - DOSED IN MG ELEMENTAL CALCIUM) 950 MG tablet Take 200 mg of elemental calcium by mouth daily. Pt take QOD    . clonazePAM (KLONOPIN) 0.5 MG tablet Take 0.5 tablets (0.25 mg total) by mouth 3 (three) times daily as needed for anxiety. (Patient taking differently: Take 0.125 mg by mouth 3 (three) times daily as needed for anxiety. ) 40 tablet 0  . Cyanocobalamin (NASCOBAL) 500 MCG/0.1ML SOLN Use one spray nasally per week as directed. (Patient taking differently: Place 0.1 mLs into the nose every 7 (seven) days. Use one  spray nasally per week as directed.) 3 Bottle 2  . famotidine (PEPCID) 40 MG tablet Take 1 tablet (40 mg total) by mouth daily. 30 tablet 0  . gemfibrozil (LOPID) 600 MG tablet Take 1 tablet (600 mg total) by mouth 2 (two) times daily. 180 tablet 3  . mesalamine (PENTASA) 250 MG CR capsule Take 2 capsules (500 mg total) by mouth 2 (two) times daily. 360 capsule 3  . nebivolol (BYSTOLIC) 5 MG tablet Take 1 tablet (5 mg total) by mouth daily. 90 tablet 0  . Pirfenidone 267 MG CAPS Take 3 capsules by mouth 3 (three) times daily.    . predniSONE (DELTASONE) 20 MG tablet Take 2 tablets by mouth for 2 days; then 1 tablet by mouth for 3 days; then half tablet by mouth daily for 3 days and stop prednisone. 10 tablet 0  . Vitamin D, Ergocalciferol, (DRISDOL) 50000 UNITS CAPS capsule Take 50,000 Units by mouth every 7 (  seven) days.    Marland Kitchen warfarin (COUMADIN) 2 MG tablet Take 1 tablet (2 mg total) by mouth as directed. (Patient taking differently: Take 3-4 mg by mouth daily. Take 2 tablets on Monday, Wednesday, & Friday Take 1.5 tablets on all other days) 150 tablet 0  . guaiFENesin-dextromethorphan (ROBITUSSIN DM) 100-10 MG/5ML syrup Take 5 mLs by mouth every 4 (four) hours as needed for cough. 236 mL 0  . OXYGEN Inhale into the lungs. 2 liters    . psyllium (METAMUCIL SMOOTH TEXTURE) 28 % packet Take 1 packet by mouth daily as needed. For constipation      Home: Home Living Family/patient expects to be discharged to:: Inpatient rehab Living Arrangements: Spouse/significant other Available Help at Discharge: Family, Available 24 hours/day Type of Home: House Home Access: Level entry Home Layout: One level Home Equipment: None   Functional History: Prior Function Level of Independence: Independent Comments: Pt wears 02 PRN 2L/min 02. Pt recently d/ced from hospital with diagnosis of PNA.   Functional Status:  Mobility: Bed Mobility Overal bed mobility: Needs Assistance Bed Mobility: Supine to  Sit, Sit to Supine Supine to sit: Supervision, HOB elevated Sit to supine: Supervision, HOB elevated General bed mobility comments: Used gesturing to get pt to come to EOB and return to supine with increased time.  Transfers Overall transfer level: Needs assistance Equipment used: None Transfers: Sit to/from Stand Sit to Stand: Min assist General transfer comment: Min A to boost from EOB. Unsteady upon standing. Ambulation/Gait Ambulation/Gait assistance: Min assist Ambulation Distance (Feet): 100 Feet Assistive device: 1 person hand held assist Gait Pattern/deviations: Step-through pattern, Decreased stride length General Gait Details: Short step lengths. Pt veering towards right side during ambulation; Min A for balance to prevent fall. Not able to follow directional cues. Sp02 decreased to 82% on 6L/min 02.  Gait velocity interpretation: <1.8 ft/sec, indicative of risk for recurrent falls    ADL:    Cognition: Cognition Overall Cognitive Status: Impaired/Different from baseline Arousal/Alertness: Awake/alert Orientation Level: Oriented to person Attention: Sustained Sustained Attention: Appears intact Awareness: Impaired Awareness Impairment: Intellectual impairment, Emergent impairment Behaviors: Perseveration Cognition Arousal/Alertness: Awake/alert Behavior During Therapy: WFL for tasks assessed/performed Overall Cognitive Status: Impaired/Different from baseline Area of Impairment: Following commands Following Commands: Follows one step commands inconsistently, Follows one step commands with increased time (with gestures and manual cues.) General Comments: Pt able to lift UEs up in air gesturing therapist and kick out LEs imitating therapist.  Difficult to assess due to: Impaired communication  Physical Exam: Blood pressure 114/56, pulse 90, temperature 97.7 F (36.5 C), temperature source Oral, resp. rate 17, weight 65.8 kg (145 lb 1 oz), SpO2 95 %. Physical  Exam Constitutional: He appears well-developed. NAD, sitting EOB HENT: oral mucosa pink Head: Normocephalic.  Eyes: EOM are normal.  Neck: Normal range of motion. Neck supple. No thyromegaly present.  Cardiovascular:  Cardiac rate controlled without murmur, rub, gallop Respiratory: Effort normal and breath sounds normal. No wheezes, rales, rhonchi GI: Soft. Bowel sounds are normal. He exhibits no distension.  Neurological: He is alert.  Patient makes good eye contact with examiner. Expressive greater than receptive aphasia. Tends to perseverate on certain activities/ideas. Delayed cough/gag. Hearing appears to be intact. Tracks to all visual fields. Gross hearing intact. Moves bilateral UE 4/5 prox to disal, deltoid, biceps, triceps, wrist, fingers. . LE: 3+ HF,  4/5 KE and adf/apf. Senses pain in al 4 limbs. DTR's 1+ and no resting tone. Skin: Skin is warm and dry Psych:  anxious, perseverative at times. Impulsive. Generally pleasant however   Results for orders placed or performed during the hospital encounter of 01/31/15 (from the past 48 hour(s))  Influenza panel by pcr     Status: None   Collection Time: 01/31/15  6:50 PM  Result Value Ref Range   Influenza A By PCR NEGATIVE NEGATIVE   Influenza B By PCR NEGATIVE NEGATIVE   H1N1 flu by pcr NOT DETECTED NOT DETECTED    Comment:        The Xpert Flu assay (FDA approved for nasal aspirates or washes and nasopharyngeal swab specimens), is intended as an aid in the diagnosis of influenza and should not be used as a sole basis for treatment.   Culture, blood (routine x 2) Call MD if unable to obtain prior to antibiotics being given     Status: None (Preliminary result)   Collection Time: 01/31/15  7:37 PM  Result Value Ref Range   Specimen Description BLOOD LEFT FOREARM    Special Requests BOTTLES DRAWN AEROBIC AND ANAEROBIC 5CC     Culture NO GROWTH < 24 HOURS    Report Status PENDING   HIV antibody     Status: None    Collection Time: 01/31/15  7:37 PM  Result Value Ref Range   HIV Screen 4th Generation wRfx Non Reactive Non Reactive    Comment: (NOTE) Performed At: [facility] [place], [place] [id number] Lindon Romp MD [id number]   Culture, blood (routine x 2) Call MD if unable to obtain prior to antibiotics being given     Status: None (Preliminary result)   Collection Time: 01/31/15  7:40 PM  Result Value Ref Range   Specimen Description BLOOD LEFT HAND    Special Requests BOTTLES DRAWN AEROBIC ONLY 8CC    Culture NO GROWTH < 24 HOURS    Report Status PENDING   Lipid panel     Status: Abnormal   Collection Time: 02/01/15 10:50 AM  Result Value Ref Range   Cholesterol 103 0 - 200 mg/dL   Triglycerides 72 <150 mg/dL   HDL 33 (L) >40 mg/dL   Total CHOL/HDL Ratio 3.1 RATIO   VLDL 14 0 - 40 mg/dL   LDL Cholesterol 56 0 - 99 mg/dL    Comment:        Total Cholesterol/HDL:CHD Risk Coronary Heart Disease Risk Table                     Men   Women  1/2 Average Risk   3.4   3.3  Average Risk       5.0   4.4  2 X Average Risk   9.6   7.1  3 X Average Risk  23.4   11.0        Use the calculated Patient Ratio above and the CHD Risk Table to determine the patient's CHD Risk.        ATP III CLASSIFICATION (LDL):  <100     mg/dL   Optimal  100-129  mg/dL   Near or Above                    Optimal  130-159  mg/dL   Borderline  160-189  mg/dL   High  >190     mg/dL   Very High   CBC     Status: Abnormal   Collection Time: 02/02/15  5:50 AM  Result Value Ref Range  WBC 17.5 (H) 4.0 - 10.5 K/uL   RBC 3.91 (L) 4.22 - 5.81 MIL/uL   Hemoglobin 12.5 (L) 13.0 - 17.0 g/dL   HCT 37.1 (L) 39.0 - 52.0 %   MCV 94.9 78.0 - 100.0 fL   MCH 32.0 26.0 - 34.0 pg   MCHC 33.7 30.0 - 36.0 g/dL   RDW 12.7 11.5 - 15.5 %   Platelets 218 150 - 400 K/uL  Basic metabolic panel     Status: Abnormal   Collection Time: 02/02/15  5:50 AM  Result Value Ref Range   Sodium 137 135 - 145  mmol/L   Potassium 3.0 (L) 3.5 - 5.1 mmol/L    Comment: DELTA CHECK NOTED   Chloride 100 (L) 101 - 111 mmol/L   CO2 27 22 - 32 mmol/L   Glucose, Bld 123 (H) 65 - 99 mg/dL   BUN 12 6 - 20 mg/dL   Creatinine, Ser 0.89 0.61 - 1.24 mg/dL   Calcium 8.3 (L) 8.9 - 10.3 mg/dL   GFR calc non Af Amer >60 >60 mL/min   GFR calc Af Amer >60 >60 mL/min    Comment: (NOTE) The eGFR has been calculated using the CKD EPI equation. This calculation has not been validated in all clinical situations. eGFR's persistently <60 mL/min signify possible Chronic Kidney Disease.    Anion gap 10 5 - 15   Mr Virgel Paling Wo Contrast  01/31/2015  CLINICAL DATA:  Since change in speech at 8 o\'clock a.m. Right arm weakness. Right facial droop. A aphasia. Right arm weakness has improved. EXAM: MRI HEAD WITHOUT CONTRAST MRA HEAD WITHOUT CONTRAST TECHNIQUE: Multiplanar, multiecho pulse sequences of the brain and surrounding structures were obtained without intravenous contrast. Angiographic images of the head were obtained using MRA technique without contrast. COMPARISON:  CT head without contrast in CTA head and neck from the same day. FINDINGS: MRI HEAD FINDINGS Acute nonhemorrhagic posterior left MCA territory infarct is confirmed. This involves the left sylvian fissure extends superiorly within the left parietal lobe. There is at least 1 additional punctate cortical infarct in the left parietal lobe on image 31 of series 7. T2 changes are evident within the area of acute infarction. Additional subcortical and periventricular T2 changes are evident bilaterally. Flow is present in the major intracranial arteries. Bilateral lens replacements are present. The paranasal sinuses mastoid air cells are clear. MRA HEAD FINDINGS Internal carotid arteries are within normal limits from the high cervical segments through the ICA termini bilaterally. The A1 and M1 segments are normal. Anterior communicating artery is patent. ACA branch vessels  are within normal limits. The MCA bifurcations are intact bilaterally. Right MCA branch vessels are normal. A distal posterior left MCA branch vessel occlusion is noted. One additional left M3 branch vessels opacified compared to the earlier CTA. The left vertebral artery is the dominant vessel. The right PICA origin is visualized and normal. AICA vessels are noted bilaterally. The left posterior cerebral artery originates from the basilar tip. The right posterior cerebral artery originates from the basilar tip and a posterior communicating artery. The PCA branch vessels are intact. IMPRESSION: 1. Acute nonhemorrhagic posterior left MCA territory infarct is confirmed. 2. Occlusion of distal left posterior MCA branch vessel. There is opacification of an additional left MCA branch vessels since the earlier CTA study. 3. Extensive white matter disease is present in addition to the acute infarct. Electronically Signed   By: San Morelle M.D.   On: 01/31/2015 16:09   Mr  Brain Wo Contrast  01/31/2015  CLINICAL DATA:  Since change in speech at 8 o\'clock a.m. Right arm weakness. Right facial droop. A aphasia. Right arm weakness has improved. EXAM: MRI HEAD WITHOUT CONTRAST MRA HEAD WITHOUT CONTRAST TECHNIQUE: Multiplanar, multiecho pulse sequences of the brain and surrounding structures were obtained without intravenous contrast. Angiographic images of the head were obtained using MRA technique without contrast. COMPARISON:  CT head without contrast in CTA head and neck from the same day. FINDINGS: MRI HEAD FINDINGS Acute nonhemorrhagic posterior left MCA territory infarct is confirmed. This involves the left sylvian fissure extends superiorly within the left parietal lobe. There is at least 1 additional punctate cortical infarct in the left parietal lobe on image 31 of series 7. T2 changes are evident within the area of acute infarction. Additional subcortical and periventricular T2 changes are evident  bilaterally. Flow is present in the major intracranial arteries. Bilateral lens replacements are present. The paranasal sinuses mastoid air cells are clear. MRA HEAD FINDINGS Internal carotid arteries are within normal limits from the high cervical segments through the ICA termini bilaterally. The A1 and M1 segments are normal. Anterior communicating artery is patent. ACA branch vessels are within normal limits. The MCA bifurcations are intact bilaterally. Right MCA branch vessels are normal. A distal posterior left MCA branch vessel occlusion is noted. One additional left M3 branch vessels opacified compared to the earlier CTA. The left vertebral artery is the dominant vessel. The right PICA origin is visualized and normal. AICA vessels are noted bilaterally. The left posterior cerebral artery originates from the basilar tip. The right posterior cerebral artery originates from the basilar tip and a posterior communicating artery. The PCA branch vessels are intact. IMPRESSION: 1. Acute nonhemorrhagic posterior left MCA territory infarct is confirmed. 2. Occlusion of distal left posterior MCA branch vessel. There is opacification of an additional left MCA branch vessels since the earlier CTA study. 3. Extensive white matter disease is present in addition to the acute infarct. Electronically Signed   By: San Morelle M.D.   On: 01/31/2015 16:09   Dg Chest Port 1 View  02/02/2015  CLINICAL DATA:  Pneumonia. EXAM: PORTABLE CHEST 1 VIEW COMPARISON:  01/31/2015.  06/07/2013. FINDINGS: Mediastinum hilar structures are normal. Cardiomegaly. Right upper lobe infiltrate again noted consistent with pneumonia. No interim change. Mild increase in pulmonary interstitial markings noted in the lung bases. These changes could be related interstitial edema and/or pneumonitis. A component interstitial prominence in the lung bases is most likely related to chronic interstitial lung disease. No prominent pleural effusion or  pneumothorax. IMPRESSION: 1. Persistent right upper lobe infiltrate consistent with pneumonia. No significant interim improvement. 2. Cardiomegaly with increase in pulmonary interstitial prominence in the lung bases. Congestive heart failure cannot be excluded. Pneumonitis cannot be excluded. A component of the interstitial prominence in the lung bases is also most likely related chronic interstitial lung disease. Electronically Signed   By: Marcello Moores  Register   On: 02/02/2015 07:23   Dg Chest Port 1 View  01/31/2015  CLINICAL DATA:  Pneumonia and call EXAM: PORTABLE CHEST 1 VIEW COMPARISON:  Radiograph 01/26/2015 FINDINGS: Normal cardiac silhouette. There is airspace opacity in the RIGHT upper lobe which is increased slightly in density. Mild airspace disease in the RIGHT lower lobe appears chronic. Underlying severe emphysematous change in the RIGHT upper lobe. IMPRESSION: Slight worsening of RIGHT upper lobe pneumonia superimposed on emphysematous change. Electronically Signed   By: Suzy Bouchard M.D.   On: 01/31/2015 11:42  Dg Swallowing Func-speech Pathology  02/01/2015  Objective Swallowing Evaluation:   Patient Details Name: ABDIRAHMAN CHITTUM MRN: [id number] Date of Birth: 23-Apr-1928 Today's Date: 02/01/2015 Time: SLP Start Time (ACUTE ONLY): 0945-SLP Stop Time (ACUTE ONLY): 1010 SLP Time Calculation (min) (ACUTE ONLY): 25 min Past Medical History: Past Medical History Diagnosis Date . Muscle tremor    saw neurology remotely elsewhere, was Rx inderal . Crohn's ileocolitis ([place])  . Osteopenia    per DEXA 11/2008 . Hypertension 11/11   mild . Hemorrhoids  . Hypertrophy of prostate    w/o UR obst & oth luts . Peripheral neuropathy (HCC)    h/o neg w/u . ED (erectile dysfunction)  . Hypertriglyceridemia  . COPD (chronic obstructive pulmonary disease) ([place])    recent dx--no acute problems . Hiatal hernia 1956   found while in service . Anxiety  . Atrial fibrillation with RVR ([place])  . Long term (current) use of  anticoagulants 02/21/2011 . Serrated adenoma of colon 03/2010 . Renal cyst    bilateral . Pulmonary fibrosis ([place]) 2015   Rx Esbriet ~ 10-2013 ([name]), Dr. [name] . On home oxygen therapy    "2L; 20-24h for the past week" (01/26/2015) . Emphysema lung ([place]) 2016   Dr. [name] . B12 deficiency anemia  . Idiopathic pulmonary fibrosis ([place]) 2016   Dr. [name] . Pneumonia ?2015 . CAP (community acquired pneumonia) 01/26/2015 . Osteoarthritis    "maybe in my hands, feet" (01/26/2015) . Depression  . Squamous cell carcinoma of skin of scalp    tx'd ~ 67yr; "froze them off" . Squamous cell carcinoma, face     "froze them off" . Melanoma of scalp ([facility]  Past Surgical History: Past Surgical History Procedure Laterality Date . Colon resection  1984   resection of distal ileum and cecum w/ appendectomy, 18-inch small intestine, for Crohn's disease . Colon surgery   . Appendectomy  1984 . Cataract extraction w/ intraocular lens  implant, bilateral Bilateral 2007 . Cholecystectomy  02/17/2011   Procedure: LAPAROSCOPIC CHOLECYSTECTOMY WITH INTRAOPERATIVE CHOLANGIOGRAM;  Surgeon: BEdward Jolly MD;  Location: [facility];  Service: General;  Laterality: N/A; . Mohs surgery Right ~ 2014   "side of my scalp" HPI: 81y.o. male with pulmonary fibrosis, atrial fibrillation, Chron's, HTN, COPD, hiatal hernia and pna 01/26/15 admitted with right side weakness and inability to speak. CT negative, MRI showed acute nonhemorrhagic posterior left MCA territory infarct. CXR Slight worsening of RIGHT upper lobe pneumonia superimposed on emphysematous change. BSE 11/4 without difficulty and regular/thin recommended. Subjective: pleasant, talkative Assessment / Plan / Recommendation CHL IP CLINICAL IMPRESSIONS 02/01/2015 Therapy Diagnosis Severe pharyngeal phase dysphagia Clinical Impression Pt demonstrated severe oropharyngeal neuromuscular dysphagia characterized by sensorimotor impariment. Pt has adequate oral movement, however due reduced  sensation and muscular control results in premature spillage. Pt's pharyngeal phase is characterized by reduced hyolaryngeal elevation resulting in reduced epiglottic deflection and delayed swallow initation to the valleculae for puree and pyriforms for liquids, which results in unsensed penetration/aspiration of liquids. Pt did not sense aspiration until after it passed through his vocal cords.  Pt has significant pharyngeal residue across all consistences. Pt's global aphasia interferes with compensatory strategies and max verbal, tactile, and visual cues for second swallow to clear residue. While a chin tuck with mod-max assist reduced vallecular residue with thin liquids, SLP recommends pt remain NPO due to high risk of aspiration and risk of futher respiratory distress; pt has no functional reserve due to baseline pulmonary fibrosis and right  upper lobe pneumonia prior to stroke.  Pt will benefit from CIR for intensive dysphagia and aphasia treatment. SLP will f/u with diagnostic assessment and potential repeat of objective assessment if swallow function has improved.    [facility] IP TREATMENT RECOMMENDATION 02/01/2015 Treatment Recommendations Therapy as outlined in treatment plan below   [facility] IP DIET RECOMMENDATION 02/01/2015 SLP Diet Recommendations NPO;Alternative means - temporary Liquid Administration via -- Medication Administration Via alternative means Compensations -- Postural Changes --   [facility] IP OTHER RECOMMENDATIONS 02/01/2015 Recommended Consults -- Oral Care Recommendations Oral care QID Other Recommendations --   [facility] IP FOLLOW UP RECOMMENDATIONS 02/01/2015 Follow up Recommendations Inpatient Rehab   [facility] IP FREQUENCY AND DURATION 02/01/2015 Speech Therapy Frequency (ACUTE ONLY) min 3x week Treatment Duration --      [facility] IP ORAL PHASE 02/01/2015 Oral Phase Impaired Oral - Pudding Teaspoon -- Oral - Pudding Cup -- Oral - Honey Teaspoon -- Oral - Honey Cup -- Oral - Nectar Teaspoon -- Oral - Nectar Cup  Delayed oral transit;Premature spillage Oral - Nectar Straw Delayed oral transit;Premature spillage Oral - Thin Teaspoon -- Oral - Thin Cup Delayed oral transit;Premature spillage Oral - Thin Straw Delayed oral transit;Premature spillage Oral - Puree Delayed oral transit;Premature spillage Oral - Mech Soft -- Oral - Regular -- Oral - Multi-Consistency -- Oral - Pill -- Oral Phase - Comment --  [facility] IP PHARYNGEAL PHASE 02/01/2015 Pharyngeal Phase Nectar;Thin;Solids Pharyngeal- Pudding Teaspoon -- Pharyngeal -- Pharyngeal- Pudding Cup -- Pharyngeal -- Pharyngeal- Honey Teaspoon -- Pharyngeal -- Pharyngeal- Honey Cup -- Pharyngeal -- Pharyngeal- Nectar Teaspoon NT Pharyngeal -- Pharyngeal- Nectar Cup Delayed swallow initiation-vallecula;Delayed swallow initiation-pyriform sinuses;Reduced epiglottic inversion;Penetration/Aspiration during swallow;Penetration/Aspiration before swallow;Trace aspiration;Pharyngeal residue - valleculae;Pharyngeal residue - pyriform Pharyngeal Material enters airway, remains ABOVE vocal cords and not ejected out Pharyngeal- Nectar Straw Delayed swallow initiation-vallecula;Delayed swallow initiation-pyriform sinuses;Reduced epiglottic inversion;Penetration/Aspiration during swallow;Penetration/Apiration after swallow;Trace aspiration;Pharyngeal residue - valleculae;Pharyngeal residue - pyriform Pharyngeal Material enters airway, remains ABOVE vocal cords and not ejected out Pharyngeal- Thin Teaspoon NT Pharyngeal -- Pharyngeal- Thin Cup Delayed swallow initiation-vallecula;Delayed swallow initiation-pyriform sinuses;Reduced epiglottic inversion;Penetration/Aspiration during swallow;Penetration/Apiration after swallow;Trace aspiration;Pharyngeal residue - valleculae;Pharyngeal residue - pyriform Pharyngeal Material enters airway, passes BELOW cords without attempt by patient to eject out (silent aspiration);Material enters airway, passes BELOW cords and not ejected out despite cough attempt by  patient Pharyngeal- Thin Straw Delayed swallow initiation-vallecula;Delayed swallow initiation-pyriform sinuses;Reduced epiglottic inversion;Penetration/Aspiration during swallow;Penetration/Apiration after swallow;Trace aspiration;Pharyngeal residue - valleculae;Pharyngeal residue - pyriform Pharyngeal Material enters airway, passes BELOW cords without attempt by patient to eject out (silent aspiration);Material enters airway, passes BELOW cords and not ejected out despite cough attempt by patient Pharyngeal- Puree Delayed swallow initiation-vallecula;Reduced epiglottic inversion;Pharyngeal residue - valleculae;Pharyngeal residue - pyriform Pharyngeal -- Pharyngeal- Mechanical Soft NT Pharyngeal -- Pharyngeal- Regular NT Pharyngeal -- Pharyngeal- Multi-consistency NT Pharyngeal -- Pharyngeal- Pill NT Pharyngeal -- Pharyngeal Comment --  [facility] IP CERVICAL ESOPHAGEAL PHASE 02/01/2015 Cervical Esophageal Phase WFL Pudding Teaspoon -- Pudding Cup -- Honey Teaspoon -- Honey Cup -- Nectar Teaspoon -- Nectar Cup -- Nectar Straw -- Thin Teaspoon -- Thin Cup -- Thin Straw -- Puree -- Mechanical Soft -- Regular -- Multi-consistency -- Pill -- Cervical Esophageal Comment -- Completed by Lanier Ensign, SLP Student Supervised and reviewed by Herbie Baltimore MA CCC-SLP [place], Katherene Ponto 02/01/2015, 2:43 PM                  Medical Problem List and Plan: 1. 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  DVT Prophylaxis/Anticoagulation: Eliquis. Monitor platelet counts and any signs of bleeding 3. Pain Management: Tylenol as needed  -no pain at present 4. Mood: BuSpar 15 mg twice a day, Klonopin 0.25 mg 3 times daily as needed  -may need further medication adjustment  -continued egosupport, environmental mod 5. Neuropsych: This patient is capable of making decisions on his own behalf. 6. Skin/Wound Care: Routine skin checks 7. Fluids/Electrolytes/Nutrition: Routine I&O follow-up  chemistries  -adjust h2o flushes as indicated  -replace potassium 8. Dysphagia. Nothing by mouth. Nasogastric tube in place. Follow-up speech therapy for advancement  -pt is high aspiration/pneumonia risk given dysphagia and PF 9. Healthcare associated pneumonia as well as history of pulmonary fibrosis. Continue Levaquin. Chronic oxygen as directed  -aspiration precautions  -afebrile at present 10. Hyperlipidemia. Lopid/Pravachol 11. Atrial fibrillation:  -HR controlled at present  -eliquis--has had some epistaxis which bears observation    Post Admission Physician Evaluation: 1. Functional deficits secondary  to embolic left MCA infarct. 2. Patient is admitted to receive collaborative, interdisciplinary care between the physiatrist, rehab nursing staff, and therapy team. 3. Patient's level of medical complexity and substantial therapy needs in context of that medical necessity cannot be provided at a lesser intensity of care such as a SNF. 4. Patient has experienced substantial functional loss from his/her baseline which was documented above under the "Functional History" and "Functional Status" headings.  Judging by the patient's diagnosis, physical exam, and functional history, the patient has potential for functional progress which will result in measurable gains while on inpatient rehab.  These gains will be of substantial and practical use upon discharge  in facilitating mobility and self-care at the household level. 5. Physiatrist will provide 24 hour management of medical needs as well as oversight of the therapy plan/treatment and provide guidance as appropriate regarding the interaction of the two. 6. 24 hour rehab nursing will assist with bladder management, bowel management, safety, skin/wound care, disease management, medication administration, pain management and patient education  and help integrate therapy concepts, techniques,education, etc. 7. PT will assess and treat  for/with: Lower extremity strength, range of motion, stamina, balance, functional mobility, safety, adaptive techniques and equipment, cognitive-linguistic impact upon safety,balance; NMR; family education; ego support.   Goals are: supervision to mod I. 8. OT will assess and treat for/with: ADL's, functional mobility, safety, upper extremity strength, adaptive techniques and equipment, nmr, cognitivie linguistic needs, family ed, community reintegration.   Goals are: mod I to supervision. Therapy may proceed with showering this patient. 9. SLP will assess and treat for/with: language, swallowing, communication, family education, ego support.  Goals are: min to mod assist. 10. Case Management and Social Worker will assess and treat for psychological issues and discharge planning. 11. Team conference will be held weekly to assess progress toward goals and to determine barriers to discharge. 12. Patient will receive at least 3 hours of therapy per day at least 5 days per week. 13. ELOS: 7-12 days       14. Prognosis:  excellent     Meredith Staggers, MD, [facility] 02/02/2015

## 2015-02-02 NOTE — Progress Notes (Signed)
Nutrition Follow-up  DOCUMENTATION CODES:   Non-severe (moderate) malnutrition in context of chronic illness  INTERVENTION:  Initiate Jevity 1.2 @ 20 ml/hr via Cortrak feeding tube and increase by 10 ml every 4 hours to goal rate of 75 ml/hr.   Tube feeding regimen provides 2160 kcal (100% of needs), 100 grams of protein, and 1458 ml of H2O.   Provide 90 ml free water flush every 4 hours. This will provide 540 ml of free water. Fluid provided via TF regimen per 24 hours will total 1998 ml.   NUTRITION DIAGNOSIS:   Malnutrition related to poor appetite, chronic illness as evidenced by per patient/family report, severe depletion of muscle mass, moderate depletion of body fat.  Ongoing  GOAL:   Patient will meet greater than or equal to 90% of their needs  Unmet  MONITOR:   TF tolerance, Weight trends, Labs, Skin, I & O's  REASON FOR ASSESSMENT:   Consult Enteral/tube feeding initiation and management  ASSESSMENT:   Brian [name] is a 81 y.o. male with pulmonary fibrosis, atrial fibrillation and chron's. He is on chronic coumadin. He presents to Ed today with stroke like symptoms. PMH of HTN, HLD, COPD.  Pt was advanced to a dysphagia 2 diet with necar-thick liquids on 11/9 and was eating 50-75% of meals, but pt was reassessed by SLP this morning and made NPO. Pt now has Cortak small feeding tube. TF to be initiated this afternoon.   Labs: low potassium, low calcium  Diet Order:  Diet NPO time specified  Skin:  Reviewed, no issues  Last BM:  11/11  Height:   Ht Readings from Last 1 Encounters:  01/26/15 5' 11.75" (1.822 m)    Weight:   Wt Readings from Last 1 Encounters:  01/31/15 145 lb 1 oz (65.8 kg)    Ideal Body Weight:  81 kg  BMI:  Body mass index is 19.82 kg/(m^2).  Estimated Nutritional Needs:   Kcal:  2000-2200  Protein:  95-105 grams  Fluid:  2 L/day  EDUCATION NEEDS:   No education needs identified at this time  [place],  LDN Inpatient Clinical Dietitian Pager: [phone] After Hours Pager: [phone]

## 2015-02-02 NOTE — Progress Notes (Signed)
Brian Staggers, MD Physician Signed Physical Medicine and Rehabilitation Consult Note 02/02/2015 6:23 AM  Related encounter: ED to Hosp-Admission (Current) from 01/31/2015 in [place] Collapse All        Physical Medicine and Rehabilitation Consult Reason for Consult: Left MCA infarct embolic secondary to known atrial fibrillation Referring Physician: [facility]   HPI: Brian [name] is a 81 y.o. right handed male with history of pulmonary fibrosis with home oxygen 2 L, atrial fibrillation on chronic Coumadin. This was spouse one level home one-step entry independent prior to admission. Presented 01/31/2015 with aphasia and right-sided weakness with facial droop. Cranial CT scan negative. CTA of head and neck showed no large vessel occlusion. INR upon admission of 1.9. 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 Occlusion of distal left posterior MCA branch vessel. Echocardiogram with ejection fraction of 60% no wall motion abnormalities. Patient did not receive TPA. Neurology follow-up Coumadin and transitioned to Eliquis for CVA prophylaxis as well as history of atrial fibrillation. Dysphagia to nectar thick liquids. Maintained on broad-spectrum antibiotics for possible healthcare associated pneumonia. Patient has needed oxygen 6 L with ambulation with noted history of pulmonary fibrosis oxygen saturations 82%. Physical therapy evaluation completed 02/01/2015 with recommendations of physical medicine rehabilitation consult.   Review of Systems  Constitutional: Negative for fever and chills.  HENT: Negative for hearing loss.  Eyes: Negative for blurred vision and double vision.  Respiratory: Negative for cough.   Shortness of breath with exertion  Cardiovascular: Positive for palpitations and leg swelling. Negative for chest pain.  Gastrointestinal: Positive for constipation. Negative for nausea, vomiting and  abdominal pain.  Genitourinary: Negative for dysuria and hematuria.  Musculoskeletal: Positive for myalgias and joint pain.  Skin: Negative for rash.  Neurological: Positive for weakness. Negative for seizures, loss of consciousness and headaches.  Psychiatric/Behavioral: Positive for depression.   Anxiety  All other systems reviewed and are negative.  Past Medical History  Diagnosis Date  . Muscle tremor     saw neurology remotely elsewhere, was Rx inderal  . Crohn's ileocolitis ([place])   . Osteopenia     per DEXA 11/2008  . Hypertension 11/11    mild  . Hemorrhoids   . Hypertrophy of prostate     w/o UR obst & oth luts  . Peripheral neuropathy (HCC)     h/o neg w/u  . ED (erectile dysfunction)   . Hypertriglyceridemia   . COPD (chronic obstructive pulmonary disease) ([place])     recent dx--no acute problems  . Hiatal hernia 1956    found while in service  . Anxiety   . Atrial fibrillation with RVR ([place])   . Long term (current) use of anticoagulants 02/21/2011  . Serrated adenoma of colon 03/2010  . Renal cyst     bilateral  . Pulmonary fibrosis ([place]) 2015    Rx Esbriet ~ 10-2013 ([name]), Dr. [name]  . On home oxygen therapy     "2L; 20-24h for the past week" (01/26/2015)  . Emphysema lung ([place]) 2016    Dr. [name]  . B12 deficiency anemia   . Idiopathic pulmonary fibrosis ([place]) 2016    Dr. [name]  . Pneumonia ?2015  . CAP (community acquired pneumonia) 01/26/2015  . Osteoarthritis     "maybe in my hands, feet" (01/26/2015)  . Depression   . Squamous cell carcinoma of skin of scalp     tx'd ~ 29yrs; "froze them  off"  . Squamous cell carcinoma, face     "froze them off"  . Melanoma of scalp [facility])    Past Surgical History  Procedure Laterality Date  . Colon resection  1984    resection of distal ileum and cecum w/  appendectomy, 18-inch small intestine, for Crohn's disease  . Colon surgery    . Appendectomy  1984  . Cataract extraction w/ intraocular lens implant, bilateral Bilateral 2007  . Cholecystectomy  02/17/2011    Procedure: LAPAROSCOPIC CHOLECYSTECTOMY WITH INTRAOPERATIVE CHOLANGIOGRAM; Surgeon: Edward Jolly, MD; Location: [facility] ORS; Service: General; Laterality: N/A;  . Mohs surgery Right ~ 2014    "side of my scalp"   Family History  Problem Relation Age of Onset  . Crohn's disease Brother   . Breast cancer Mother   . Colon cancer Neg Hx   . Prostate cancer Neg Hx   . Heart attack Father 24  . Dementia Sister   . Diabetes Sister   . Stroke Mother 49   Social History:  reports that he quit smoking about 24 years ago. His smoking use included Cigarettes and Pipe. He has a 40 pack-year smoking history. He has never used smokeless tobacco. He reports that he does not drink alcohol or use illicit drugs. Allergies: No Known Allergies Medications Prior to Admission  Medication Sig Dispense Refill  . acetaminophen (TYLENOL) 325 MG tablet Take 650 mg by mouth as needed for pain.    Marland Kitchen amLODipine (NORVASC) 5 MG tablet Take 1 tablet (5 mg total) by mouth daily. 90 tablet 3  . amoxicillin-clavulanate (AUGMENTIN) 875-125 MG tablet Take 1 tablet by mouth 2 (two) times daily. 16 tablet 0  . azelastine (ASTELIN) 0.1 % nasal spray Place 1 spray into both nostrils 2 (two) times daily as needed for allergies. Use in each nostril as directed    . busPIRone (BUSPAR) 15 MG tablet Take 1 tablet (15 mg total) by mouth 2 (two) times daily. 180 tablet 0  . calcium citrate (CALCITRATE - DOSED IN MG ELEMENTAL CALCIUM) 950 MG tablet Take 200 mg of elemental calcium by mouth daily. Pt take QOD    . clonazePAM (KLONOPIN) 0.5 MG tablet Take 0.5 tablets (0.25 mg total) by mouth 3 (three) times daily as needed for  anxiety. (Patient taking differently: Take 0.125 mg by mouth 3 (three) times daily as needed for anxiety. ) 40 tablet 0  . Cyanocobalamin (NASCOBAL) 500 MCG/0.1ML SOLN Use one spray nasally per week as directed. (Patient taking differently: Place 0.1 mLs into the nose every 7 (seven) days. Use one spray nasally per week as directed.) 3 Bottle 2  . famotidine (PEPCID) 40 MG tablet Take 1 tablet (40 mg total) by mouth daily. 30 tablet 0  . gemfibrozil (LOPID) 600 MG tablet Take 1 tablet (600 mg total) by mouth 2 (two) times daily. 180 tablet 3  . mesalamine (PENTASA) 250 MG CR capsule Take 2 capsules (500 mg total) by mouth 2 (two) times daily. 360 capsule 3  . nebivolol (BYSTOLIC) 5 MG tablet Take 1 tablet (5 mg total) by mouth daily. 90 tablet 0  . Pirfenidone 267 MG CAPS Take 3 capsules by mouth 3 (three) times daily.    . predniSONE (DELTASONE) 20 MG tablet Take 2 tablets by mouth for 2 days; then 1 tablet by mouth for 3 days; then half tablet by mouth daily for 3 days and stop prednisone. 10 tablet 0  . Vitamin D, Ergocalciferol, (DRISDOL) 50000 UNITS CAPS capsule Take  50,000 Units by mouth every 7 (seven) days.    Marland Kitchen warfarin (COUMADIN) 2 MG tablet Take 1 tablet (2 mg total) by mouth as directed. (Patient taking differently: Take 3-4 mg by mouth daily. Take 2 tablets on Monday, Wednesday, & Friday Take 1.5 tablets on all other days) 150 tablet 0  . guaiFENesin-dextromethorphan (ROBITUSSIN DM) 100-10 MG/5ML syrup Take 5 mLs by mouth every 4 (four) hours as needed for cough. 236 mL 0  . OXYGEN Inhale into the lungs. 2 liters    . psyllium (METAMUCIL SMOOTH TEXTURE) 28 % packet Take 1 packet by mouth daily as needed. For constipation      Home: Home Living Family/patient expects to be discharged to:: Inpatient rehab Living Arrangements: Spouse/significant other Available Help at Discharge: Family, Available 24 hours/day Type of Home:  House Home Access: Level entry Home Layout: One level Home Equipment: None  Functional History: Prior Function Level of Independence: Independent Comments: Pt wears 02 PRN 2L/min 02. Pt recently d/ced from hospital with diagnosis of PNA.  Functional Status:  Mobility: Bed Mobility Overal bed mobility: Needs Assistance Bed Mobility: Supine to Sit, Sit to Supine Supine to sit: Supervision, HOB elevated Sit to supine: Supervision, HOB elevated General bed mobility comments: Used gesturing to get pt to come to EOB and return to supine with increased time.  Transfers Overall transfer level: Needs assistance Equipment used: None Transfers: Sit to/from Stand Sit to Stand: Min assist General transfer comment: Min A to boost from EOB. Unsteady upon standing. Ambulation/Gait Ambulation/Gait assistance: Min assist Ambulation Distance (Feet): 100 Feet Assistive device: 1 person hand held assist Gait Pattern/deviations: Step-through pattern, Decreased stride length General Gait Details: Short step lengths. Pt veering towards right side during ambulation; Min A for balance to prevent fall. Not able to follow directional cues. Sp02 decreased to 82% on 6L/min 02.  Gait velocity interpretation: <1.8 ft/sec, indicative of risk for recurrent falls    ADL:    Cognition: Cognition Overall Cognitive Status: Impaired/Different from baseline Arousal/Alertness: Awake/alert Orientation Level: Oriented to person Attention: Sustained Sustained Attention: Appears intact Awareness: Impaired Awareness Impairment: Intellectual impairment, Emergent impairment Behaviors: Perseveration Cognition Arousal/Alertness: Awake/alert Behavior During Therapy: WFL for tasks assessed/performed Overall Cognitive Status: Impaired/Different from baseline Area of Impairment: Following commands Following Commands: Follows one step commands inconsistently, Follows one step commands with increased time (with gestures  and manual cues.) General Comments: Pt able to lift UEs up in air gesturing therapist and kick out LEs imitating therapist.  Difficult to assess due to: Impaired communication  Blood pressure 129/76, pulse 92, temperature 98.3 F (36.8 C), temperature source Oral, resp. rate 20, weight 65.8 kg (145 lb 1 oz), SpO2 96 %. Physical Exam  Constitutional: He appears well-developed.  HENT:  Head: Normocephalic.  Eyes: EOM are normal.  Neck: Normal range of motion. Neck supple. No thyromegaly present.  Cardiovascular:  Cardiac rate controlled  Respiratory: Effort normal and breath sounds normal.  GI: Soft. Bowel sounds are normal. He exhibits no distension.  Neurological: He is alert.  Patient makes good eye contact with examiner. He is aphasic but able to provide some spontaneous words. Expressive greater than receptive aphasia. Tends to perseverate on certain activities/ideas. Moves bilateral UE 4/5 prox to disal. LE: 3+ to 4/5 prox to distal. Coughed after swallowing water in the room  Skin: Skin is warm and dry.     Lab Results Last 24 Hours    Results for orders placed or performed during the hospital encounter of 01/31/15 (  from the past 24 hour(s))  Lipid panel Status: Abnormal   Collection Time: 02/01/15 10:50 AM  Result Value Ref Range   Cholesterol 103 0 - 200 mg/dL   Triglycerides 72 <150 mg/dL   HDL 33 (L) >40 mg/dL   Total CHOL/HDL Ratio 3.1 RATIO   VLDL 14 0 - 40 mg/dL   LDL Cholesterol 56 0 - 99 mg/dL      Imaging Results (Last 48 hours)    Ct Angio Head W/cm &/or Wo Cm  01/31/2015 CLINICAL DATA: Right facial droop, right-sided weakness, and slurred speech. EXAM: CT ANGIOGRAPHY HEAD AND NECK TECHNIQUE: Multidetector CT imaging of the head and neck was performed using the standard protocol during bolus administration of intravenous contrast. Multiplanar CT image reconstructions and MIPs were obtained to evaluate the vascular anatomy.  Carotid stenosis measurements (when applicable) are obtained utilizing NASCET criteria, using the distal internal carotid diameter as the denominator. CONTRAST: 10mL OMNIPAQUE IOHEXOL 350 MG/ML SOLN COMPARISON: Noncontrast head CT earlier today. Chest radiographs 01/26/2015. FINDINGS: CTA NECK Aortic arch: 3 vessel aortic arch with mild atherosclerotic plaque. Brachiocephalic and subclavian arteries are patent without significant stenosis. Right carotid system: Patent with minimal non stenotic plaque at the carotid bifurcation. Left carotid system: Patent without stenosis or significant atherosclerosis. Vertebral arteries: Patent without significant stenosis. Left vertebral artery is mildly dominant. Skeleton: Moderate multilevel cervical disc and facet degeneration. Other neck: Extensive consolidation is partially visualized in the right upper lobe as seen on recent chest radiographs, with areas of cavitation. This is superimposed on a background of advanced centrilobular emphysema. There are multiple enlarged upper mediastinal lymph nodes predominantly in the right paratracheal region which measure up to 1.2 cm in short axis. CTA HEAD Anterior circulation: Internal carotid arteries are patent from skullbase to carotid termini with minimal atherosclerotic calcification but no stenosis. There is a 2 mm left posterior communicating artery region infundibulum versus aneurysm. M1 segments are widely patent. MCA bifurcations are patent. There is occlusion of a left M2 inferior division branch vessel approximately 1.5 cm distal to the MCA bifurcation (series 9, image 88 and series 11, image 117). ACAs are patent without significant stenosis. Posterior circulation: Intracranial vertebral arteries are patent with the left being dominant. Right vertebral artery is markedly hypoplastic distal to the PICA origin. SCA origins are patent. Basilar artery is diffusely small in caliber on a developmental basis without  significant superimposed focal stenosis. There is a fetal origin of the right PCA. PCAs are patent without evidence of significant stenosis. Venous sinuses: Patent. Anatomic variants: Fetal origin of the right PCA. IMPRESSION: 1. No large vessel occlusion. 2. Left M2 MCA branch vessel occlusion. 3. No cervical carotid or vertebral artery stenosis. 4. 2 mm left posterior communicating artery infundibulum versus aneurysm. 5. Extensive right upper lobe lung consolidation with areas of cavitation, incompletely visualized. Mild mediastinal lymphadenopathy. These results were called by telephone at the time of interpretation on 01/31/2015 at 10:30 am to [place] , who verbally acknowledged these results. Electronically Signed By: Logan Bores M.D. On: 01/31/2015 10:35   Ct Head Wo Contrast  01/31/2015 CLINICAL DATA: Code stroke. Right-sided weakness with facial droop and slurred speech EXAM: CT HEAD WITHOUT CONTRAST TECHNIQUE: Contiguous axial images were obtained from the base of the skull through the vertex without intravenous contrast. COMPARISON: None. FINDINGS: Ventricle size normal. Cerebral volume normal. Mild patchy hypodensity in the cerebral white matter bilaterally, most consistent with ischemia of chronic duration. Negative for acute infarct. Negative for hemorrhage  or mass. Mild calcification in the basal ganglia bilaterally. Normal arterial density. Calvarium intact. IMPRESSION: Mild chronic microvascular ischemia. No acute abnormality. Critical Value/emergent results were called by telephone at the time of interpretation on 01/31/2015 at 9:39 am to Dr. Armida Sans, who verbally acknowledged these results. Electronically Signed By: Franchot Gallo M.D. On: 01/31/2015 09:39   Ct Angio Neck W/cm &/or Wo/cm  01/31/2015 CLINICAL DATA: Right facial droop, right-sided weakness, and slurred speech. EXAM: CT ANGIOGRAPHY HEAD AND NECK TECHNIQUE: Multidetector CT imaging of the head and neck was  performed using the standard protocol during bolus administration of intravenous contrast. Multiplanar CT image reconstructions and MIPs were obtained to evaluate the vascular anatomy. Carotid stenosis measurements (when applicable) are obtained utilizing NASCET criteria, using the distal internal carotid diameter as the denominator. CONTRAST: 2mL OMNIPAQUE IOHEXOL 350 MG/ML SOLN COMPARISON: Noncontrast head CT earlier today. Chest radiographs 01/26/2015. FINDINGS: CTA NECK Aortic arch: 3 vessel aortic arch with mild atherosclerotic plaque. Brachiocephalic and subclavian arteries are patent without significant stenosis. Right carotid system: Patent with minimal non stenotic plaque at the carotid bifurcation. Left carotid system: Patent without stenosis or significant atherosclerosis. Vertebral arteries: Patent without significant stenosis. Left vertebral artery is mildly dominant. Skeleton: Moderate multilevel cervical disc and facet degeneration. Other neck: Extensive consolidation is partially visualized in the right upper lobe as seen on recent chest radiographs, with areas of cavitation. This is superimposed on a background of advanced centrilobular emphysema. There are multiple enlarged upper mediastinal lymph nodes predominantly in the right paratracheal region which measure up to 1.2 cm in short axis. CTA HEAD Anterior circulation: Internal carotid arteries are patent from skullbase to carotid termini with minimal atherosclerotic calcification but no stenosis. There is a 2 mm left posterior communicating artery region infundibulum versus aneurysm. M1 segments are widely patent. MCA bifurcations are patent. There is occlusion of a left M2 inferior division branch vessel approximately 1.5 cm distal to the MCA bifurcation (series 9, image 88 and series 11, image 117). ACAs are patent without significant stenosis. Posterior circulation: Intracranial vertebral arteries are patent with the left being dominant.  Right vertebral artery is markedly hypoplastic distal to the PICA origin. SCA origins are patent. Basilar artery is diffusely small in caliber on a developmental basis without significant superimposed focal stenosis. There is a fetal origin of the right PCA. PCAs are patent without evidence of significant stenosis. Venous sinuses: Patent. Anatomic variants: Fetal origin of the right PCA. IMPRESSION: 1. No large vessel occlusion. 2. Left M2 MCA branch vessel occlusion. 3. No cervical carotid or vertebral artery stenosis. 4. 2 mm left posterior communicating artery infundibulum versus aneurysm. 5. Extensive right upper lobe lung consolidation with areas of cavitation, incompletely visualized. Mild mediastinal lymphadenopathy. These results were called by telephone at the time of interpretation on 01/31/2015 at 10:30 am to [place] , who verbally acknowledged these results. Electronically Signed By: Logan Bores M.D. On: 01/31/2015 10:35   Mr Jodene Nam Head Wo Contrast  01/31/2015 CLINICAL DATA: Since change in speech at 8 o\'clock a.m. Right arm weakness. Right facial droop. A aphasia. Right arm weakness has improved. EXAM: MRI HEAD WITHOUT CONTRAST MRA HEAD WITHOUT CONTRAST TECHNIQUE: Multiplanar, multiecho pulse sequences of the brain and surrounding structures were obtained without intravenous contrast. Angiographic images of the head were obtained using MRA technique without contrast. COMPARISON: CT head without contrast in CTA head and neck from the same day. FINDINGS: MRI HEAD FINDINGS Acute nonhemorrhagic posterior left MCA territory infarct is confirmed. This involves  the left sylvian fissure extends superiorly within the left parietal lobe. There is at least 1 additional punctate cortical infarct in the left parietal lobe on image 31 of series 7. T2 changes are evident within the area of acute infarction. Additional subcortical and periventricular T2 changes are evident bilaterally. Flow is present  in the major intracranial arteries. Bilateral lens replacements are present. The paranasal sinuses mastoid air cells are clear. MRA HEAD FINDINGS Internal carotid arteries are within normal limits from the high cervical segments through the ICA termini bilaterally. The A1 and M1 segments are normal. Anterior communicating artery is patent. ACA branch vessels are within normal limits. The MCA bifurcations are intact bilaterally. Right MCA branch vessels are normal. A distal posterior left MCA branch vessel occlusion is noted. One additional left M3 branch vessels opacified compared to the earlier CTA. The left vertebral artery is the dominant vessel. The right PICA origin is visualized and normal. AICA vessels are noted bilaterally. The left posterior cerebral artery originates from the basilar tip. The right posterior cerebral artery originates from the basilar tip and a posterior communicating artery. The PCA branch vessels are intact. IMPRESSION: 1. Acute nonhemorrhagic posterior left MCA territory infarct is confirmed. 2. Occlusion of distal left posterior MCA branch vessel. There is opacification of an additional left MCA branch vessels since the earlier CTA study. 3. Extensive white matter disease is present in addition to the acute infarct. Electronically Signed By: San Morelle M.D. On: 01/31/2015 16:09   Mr Brain Wo Contrast  01/31/2015 CLINICAL DATA: Since change in speech at 8 o\'clock a.m. Right arm weakness. Right facial droop. A aphasia. Right arm weakness has improved. EXAM: MRI HEAD WITHOUT CONTRAST MRA HEAD WITHOUT CONTRAST TECHNIQUE: Multiplanar, multiecho pulse sequences of the brain and surrounding structures were obtained without intravenous contrast. Angiographic images of the head were obtained using MRA technique without contrast. COMPARISON: CT head without contrast in CTA head and neck from the same day. FINDINGS: MRI HEAD FINDINGS Acute nonhemorrhagic posterior left MCA  territory infarct is confirmed. This involves the left sylvian fissure extends superiorly within the left parietal lobe. There is at least 1 additional punctate cortical infarct in the left parietal lobe on image 31 of series 7. T2 changes are evident within the area of acute infarction. Additional subcortical and periventricular T2 changes are evident bilaterally. Flow is present in the major intracranial arteries. Bilateral lens replacements are present. The paranasal sinuses mastoid air cells are clear. MRA HEAD FINDINGS Internal carotid arteries are within normal limits from the high cervical segments through the ICA termini bilaterally. The A1 and M1 segments are normal. Anterior communicating artery is patent. ACA branch vessels are within normal limits. The MCA bifurcations are intact bilaterally. Right MCA branch vessels are normal. A distal posterior left MCA branch vessel occlusion is noted. One additional left M3 branch vessels opacified compared to the earlier CTA. The left vertebral artery is the dominant vessel. The right PICA origin is visualized and normal. AICA vessels are noted bilaterally. The left posterior cerebral artery originates from the basilar tip. The right posterior cerebral artery originates from the basilar tip and a posterior communicating artery. The PCA branch vessels are intact. IMPRESSION: 1. Acute nonhemorrhagic posterior left MCA territory infarct is confirmed. 2. Occlusion of distal left posterior MCA branch vessel. There is opacification of an additional left MCA branch vessels since the earlier CTA study. 3. Extensive white matter disease is present in addition to the acute infarct. Electronically Signed By: Harrell Gave  Mattern M.D. On: 01/31/2015 16:09   Dg Chest Port 1 View  01/31/2015 CLINICAL DATA: Pneumonia and call EXAM: PORTABLE CHEST 1 VIEW COMPARISON: Radiograph 01/26/2015 FINDINGS: Normal cardiac silhouette. There is airspace opacity in the RIGHT upper  lobe which is increased slightly in density. Mild airspace disease in the RIGHT lower lobe appears chronic. Underlying severe emphysematous change in the RIGHT upper lobe. IMPRESSION: Slight worsening of RIGHT upper lobe pneumonia superimposed on emphysematous change. Electronically Signed By: Suzy Bouchard M.D. On: 01/31/2015 11:42   Dg Swallowing Func-speech Pathology  02/01/2015 Objective Swallowing Evaluation: Patient Details Name: JAARON GUNNELLS MRN: [id number] Date of Birth: 07-27-1928 Today's Date: 02/01/2015 Time: SLP Start Time (ACUTE ONLY): 0945-SLP Stop Time (ACUTE ONLY): 1010 SLP Time Calculation (min) (ACUTE ONLY): 25 min Past Medical History: Past Medical History Diagnosis Date . Muscle tremor saw neurology remotely elsewhere, was Rx inderal . Crohn's ileocolitis ([name]) . Osteopenia per DEXA 11/2008 . Hypertension 11/11 mild . Hemorrhoids . Hypertrophy of prostate w/o UR obst & oth luts . Peripheral neuropathy (HCC) h/o neg w/u . ED (erectile dysfunction) . Hypertriglyceridemia . COPD (chronic obstructive pulmonary disease) ([place]) recent dx--no acute problems . Hiatal hernia 1956 found while in service . Anxiety . Atrial fibrillation with RVR ([place]) . Long term (current) use of anticoagulants 02/21/2011 . Serrated adenoma of colon 03/2010 . Renal cyst bilateral . Pulmonary fibrosis ([place]) 2015 Rx Esbriet ~ 10-2013 ([name]), Dr. [name] . On home oxygen therapy "2L; 20-24h for the past week" (01/26/2015) . Emphysema lung ([place]) 2016 Dr. [name] . B12 deficiency anemia . Idiopathic pulmonary fibrosis ([place]) 2016 Dr. [name] . Pneumonia ?2015 . CAP (community acquired pneumonia) 01/26/2015 . Osteoarthritis "maybe in my hands, feet" (01/26/2015) . Depression . Squamous cell carcinoma of skin of scalp tx'd ~ 19yrs; "froze them off" . Squamous cell carcinoma, face "froze them off" . Melanoma of scalp [facility]) Past Surgical History: Past Surgical History Procedure  Laterality Date . Colon resection 1984 resection of distal ileum and cecum w/ appendectomy, 18-inch small intestine, for Crohn's disease . Colon surgery . Appendectomy 1984 . Cataract extraction w/ intraocular lens implant, bilateral Bilateral 2007 . Cholecystectomy 02/17/2011 Procedure: LAPAROSCOPIC CHOLECYSTECTOMY WITH INTRAOPERATIVE CHOLANGIOGRAM; Surgeon: Edward Jolly, MD; Location: [facility]; Service: General; Laterality: N/A; . Mohs surgery Right ~ 2014 "side of my scalp" HPI: 81 y.o. male with pulmonary fibrosis, atrial fibrillation, Chron's, HTN, COPD, hiatal hernia and pna 01/26/15 admitted with right side weakness and inability to speak. CT negative, MRI showed acute nonhemorrhagic posterior left MCA territory infarct. CXR Slight worsening of RIGHT upper lobe pneumonia superimposed on emphysematous change. BSE 11/4 without difficulty and regular/thin recommended. Subjective: pleasant, talkative Assessment / Plan / Recommendation CHL IP CLINICAL IMPRESSIONS 02/01/2015 Therapy Diagnosis Severe pharyngeal phase dysphagia Clinical Impression Pt demonstrated severe oropharyngeal neuromuscular dysphagia characterized by sensorimotor impariment. Pt has adequate oral movement, however due reduced sensation and muscular control results in premature spillage. Pt's pharyngeal phase is characterized by reduced hyolaryngeal elevation resulting in reduced epiglottic deflection and delayed swallow initation to the valleculae for puree and pyriforms for liquids, which results in unsensed penetration/aspiration of liquids. Pt did not sense aspiration until after it passed through his vocal cords. Pt has significant pharyngeal residue across all consistences. Pt's global aphasia interferes with compensatory strategies and max verbal, tactile, and visual cues for second swallow to clear residue. While a chin tuck with mod-max assist reduced vallecular residue with thin liquids, SLP recommends pt remain  NPO due to high risk of  aspiration and risk of futher respiratory distress; pt has no functional reserve due to baseline pulmonary fibrosis and right upper lobe pneumonia prior to stroke. Pt will benefit from CIR for intensive dysphagia and aphasia treatment. SLP will f/u with diagnostic assessment and potential repeat of objective assessment if swallow function has improved. CHL IP TREATMENT RECOMMENDATION 02/01/2015 Treatment Recommendations Therapy as outlined in treatment plan below CHL IP DIET RECOMMENDATION 02/01/2015 SLP Diet Recommendations NPO;Alternative means - temporary Liquid Administration via -- Medication Administration Via alternative means Compensations -- Postural Changes -- CHL IP OTHER RECOMMENDATIONS 02/01/2015 Recommended Consults -- Oral Care Recommendations Oral care QID Other Recommendations -- CHL IP FOLLOW UP RECOMMENDATIONS 02/01/2015 Follow up Recommendations Inpatient Rehab CHL IP FREQUENCY AND DURATION 02/01/2015 Speech Therapy Frequency (ACUTE ONLY) min 3x week Treatment Duration -- CHL IP ORAL PHASE 02/01/2015 Oral Phase Impaired Oral - Pudding Teaspoon -- Oral - Pudding Cup -- Oral - Honey Teaspoon -- Oral - Honey Cup -- Oral - Nectar Teaspoon -- Oral - Nectar Cup Delayed oral transit;Premature spillage Oral - Nectar Straw Delayed oral transit;Premature spillage Oral - Thin Teaspoon -- Oral - Thin Cup Delayed oral transit;Premature spillage Oral - Thin Straw Delayed oral transit;Premature spillage Oral - Puree Delayed oral transit;Premature spillage Oral - Mech Soft -- Oral - Regular -- Oral - Multi-Consistency -- Oral - Pill -- Oral Phase - Comment -- CHL IP PHARYNGEAL PHASE 02/01/2015 Pharyngeal Phase Nectar;Thin;Solids Pharyngeal- Pudding Teaspoon -- Pharyngeal -- Pharyngeal- Pudding Cup -- Pharyngeal -- Pharyngeal- Honey Teaspoon -- Pharyngeal -- Pharyngeal- Honey Cup -- Pharyngeal -- Pharyngeal- Nectar Teaspoon NT Pharyngeal -- Pharyngeal- Nectar Cup Delayed  swallow initiation-vallecula;Delayed swallow initiation-pyriform sinuses;Reduced epiglottic inversion;Penetration/Aspiration during swallow;Penetration/Aspiration before swallow;Trace aspiration;Pharyngeal residue - valleculae;Pharyngeal residue - pyriform Pharyngeal Material enters airway, remains ABOVE vocal cords and not ejected out Pharyngeal- Nectar Straw Delayed swallow initiation-vallecula;Delayed swallow initiation-pyriform sinuses;Reduced epiglottic inversion;Penetration/Aspiration during swallow;Penetration/Apiration after swallow;Trace aspiration;Pharyngeal residue - valleculae;Pharyngeal residue - pyriform Pharyngeal Material enters airway, remains ABOVE vocal cords and not ejected out Pharyngeal- Thin Teaspoon NT Pharyngeal -- Pharyngeal- Thin Cup Delayed swallow initiation-vallecula;Delayed swallow initiation-pyriform sinuses;Reduced epiglottic inversion;Penetration/Aspiration during swallow;Penetration/Apiration after swallow;Trace aspiration;Pharyngeal residue - valleculae;Pharyngeal residue - pyriform Pharyngeal Material enters airway, passes BELOW cords without attempt by patient to eject out (silent aspiration);Material enters airway, passes BELOW cords and not ejected out despite cough attempt by patient Pharyngeal- Thin Straw Delayed swallow initiation-vallecula;Delayed swallow initiation-pyriform sinuses;Reduced epiglottic inversion;Penetration/Aspiration during swallow;Penetration/Apiration after swallow;Trace aspiration;Pharyngeal residue - valleculae;Pharyngeal residue - pyriform Pharyngeal Material enters airway, passes BELOW cords without attempt by patient to eject out (silent aspiration);Material enters airway, passes BELOW cords and not ejected out despite cough attempt by patient Pharyngeal- Puree Delayed swallow initiation-vallecula;Reduced epiglottic inversion;Pharyngeal residue - valleculae;Pharyngeal residue - pyriform Pharyngeal -- Pharyngeal- Mechanical Soft NT Pharyngeal --  Pharyngeal- Regular NT Pharyngeal -- Pharyngeal- Multi-consistency NT Pharyngeal -- Pharyngeal- Pill NT Pharyngeal -- Pharyngeal Comment -- CHL IP CERVICAL ESOPHAGEAL PHASE 02/01/2015 Cervical Esophageal Phase WFL Pudding Teaspoon -- Pudding Cup -- Honey Teaspoon -- Honey Cup -- Nectar Teaspoon -- Nectar Cup -- Nectar Straw -- Thin Teaspoon -- Thin Cup -- Thin Straw -- Puree -- Mechanical Soft -- Regular -- Multi-consistency -- Pill -- Cervical Esophageal Comment -- Completed by Lanier Ensign, SLP Student Supervised and reviewed by Herbie Baltimore MA CCC-SLP [place], Katherene Ponto 02/01/2015, 2:43 PM     Assessment/Plan: Diagnosis: left MCA infarct, embolic with gait, language, swallowing deficits 1. Does the need for close, 24 hr/day medical supervision in concert with the patient's rehab  needs make it unreasonable for this patient to be served in a less intensive setting? Yes 2. Co-Morbidities requiring supervision/potential complications: pulmonary fibrosis, pneumonia, new diarrheag, afib 3. Due to bladder management, bowel management, safety, skin/wound care, disease management, medication administration, pain management and patient education, does the patient require 24 hr/day rehab nursing? Yes 4. Does the patient require coordinated care of a physician, rehab nurse, PT (1-2 hrs/day, 5 days/week), OT (1-2 hrs/day, 5 days/week) and SLP (1-2 hrs/day, 5 days/week) to address physical and functional deficits in the context of the above medical diagnosis(es)? Yes Addressing deficits in the following areas: balance, endurance, locomotion, strength, transferring, bowel/bladder control, bathing, dressing, feeding, grooming, toileting, cognition, speech, language, swallowing and psychosocial support 5. Can the patient actively participate in an intensive therapy program of at least 3 hrs of therapy per day at least 5 days per week? Yes 6. The potential for patient to make measurable gains  while on inpatient rehab is excellent 7. Anticipated functional outcomes upon discharge from inpatient rehab are modified independent and supervision with PT, modified independent and supervision with OT, supervision, min assist and mod assist with SLP. 8. Estimated rehab length of stay to reach the above functional goals is: 8-12 days 9. Does the patient have adequate social supports and living environment to accommodate these discharge functional goals? Yes 10. Anticipated D/C setting: Home 11. Anticipated post D/C treatments: HH therapy and Outpatient therapy 12. Overall Rehab/Functional Prognosis: excellent  RECOMMENDATIONS: This patient's condition is appropriate for continued rehabilitative care in the following setting: CIR Patient has agreed to participate in recommended program. Potentially Note that insurance prior authorization may be required for reimbursement for recommended care.  Comment: Pt with severe aphasia and dysphagia. He is at substantial risk for aspiration per SLP who I spoke with this morning due to profound pharyngeal motor/sensory deficits. Probably will need to be NPO with panda for time being as he has little pulmonary reserve given his PF. Would benefit from our intensive program given his complex language as they impact his mobility and self-care activities. Rehab Admissions Coordinator to follow up.  Thanks,  Brian Staggers, MD, [facility]     02/02/2015       Revision History

## 2015-02-02 NOTE — Care Management Note (Signed)
Case Management Note  Patient Details  Name: REGIE MALOTT MRN: [id number] Date of Birth: 04-23-1928  Subjective/Objective:                    Action/Plan: Patient being discharged to CIR today. No further needs per CM.   Expected Discharge Date:                  Expected Discharge Plan:  [place]  In-House Referral:     Discharge planning Services     Post Acute Care Choice:    Choice offered to:     DME Arranged:    DME Agency:     HH Arranged:    [place] Agency:     Status of Service:  In process, will continue to follow  Medicare Important Message Given:    Date Medicare IM Given:    Medicare IM give by:    Date Additional Medicare IM Given:    Additional Medicare Important Message give by:     If discussed at [place] of Stay Meetings, dates discussed:    Additional Comments:  Pollie Friar, RN 02/02/2015, 3:33 PM

## 2015-02-02 NOTE — Discharge Summary (Signed)
Physician Discharge Summary  Brian [name] [id number] DOB: 03-25-1928 DOA: 01/31/2015  PCP: [name], MD  Admit date: 01/31/2015 Discharge date: 02/02/2015  Time spent: 35 minutes  Recommendations for Outpatient Follow-up:  1. Please follow up on a BMP and CBC in am, labs showing potassium of 3.0 for which he was given oral potassium supplementation on 02/02/2015 2. Please follow-up on blood pressures as antihypertensive agents were stopped during this hospitalization to allow for permissive hypertension in setting of stroke. 3. Prior to discharge a Panda tube was placed given severe dysphasia. 4. Recommend treating with Levaquin for 7 days for pneumonia.  Discharge Diagnoses:  Active Problems:   HYPERTRIGLYCERIDEMIA   Essential hypertension   CROHN'S DISEASE-LARGE & SMALL INTESTINE   RECTAL BLEEDING   Dyspnea   Atrial fibrillation with controlled ventricular response (HCC)   Long term (current) use of anticoagulants   Stroke ([place])   CVA (cerebral infarction)   Aphasia   Stroke with cerebral ischemia Brian [name])   Discharge Condition: Stable  Diet recommendation: Patient on tube feeds with Jevity 1.2 at 20 mL per hour with plans to increase by 10 mL every 4 hours with a goal rate of 75 ML's per hour.  Filed Weights   01/31/15 0929  Weight: 65.8 kg (145 lb 1 oz)    History of present illness:  Brian [name] is a 81 y.o. male with pulmonary fibrosis, atrial fibrillation and chron's. He is on chronic coumadin. He presents to Ed today with stroke like symptoms.  Family reports pt began having difficulty speaking this am around 8:00 am. Wife noticed pt's right side was weak. She reports he was unable to speak. Pt was normal this am and sitting in his chair when symptoms began. Pt has showed some improvement in the Emergency department. He has improved movement of right arm. Pt was initially aphasic but is currently talking. He is able to tell me who is wife is. Most speech is  garbled. Pt did not fall. Pt had no loss of consciousness. No injuries. Pt was recently discharged from the hospital after being admitted with pneumonia. Pt is currently on Augmentin. Pt has a past history of atrial fib And is on coumadin. Pt is currently on 02 2 liters at home. Pt is currently on 5 liters in ED currently.   Hospital Course:  Brian [name] is a pleasant 81 year old gentleman with past medical history of atrial fibrillation who had been chronically anticoagulated with Coumadin, presented to the emergency department with complaints of dysarthria that started at 8 AM on 01/31/2015. In the emergency room he was found to have slurred speech. Initial CT scan of brain showed mild patchy hypodensity in the cerebral white matter bilaterally consistent with ischemia of chronic duration. Radiology report negative for acute infarct. Further workup with an MRI revealed an acute nonhemorrhagic posterior left MCA territory infarct. MRA revealed occlusion of the distal left posterior MCA branch vessel. He was seen and evaluated by neurology who recommended discontinuing warfarin and starting Eliquis.    Acute CVA. -Brian [name] is a pleasant 81 year old gentleman presenting with dysarthria, MRI of brain revealed a nonhemorrhagic posterior left MCA territory infarct. -He has a history of atrial fibrillation and had been anticoagulated with warfarin. Initial labs showed INR of 1.9. -Case was discussed with Dr. Erlinda Hong of neurology recommending that his anticoagulation be changed from warfarin to Eliquis 5 mg by mouth twice a day. -Will continue statin therapy with Pravachol -Transthoracic echocardiogram performed on 01/31/2015 showing EF  of 55-60% -Speech pathology reported dysphasia persistently severe with other signs of aspiration during oral her task with ice chips. They recommended short-term also the method of nutrition. [name] was placed on 02/02/2015 as nutrition was consulted for tube feeds,  recommending Jevity 1.2 at 20 mL per hour with plans to increase by 10 mL every 4 hours with a goal rate of 75 ML's per hour. -Patient receive ongoing care at the inpatient rehabilitation facility.  2. Possible healthcare associated pneumonia -A chest x-ray on admission showing slightly worse in the right upper lobe pneumonia superimposed on emphysematous changes. He was started on empiric antibiotic therapy with Augmentin in the outpatient setting. -He has remained afebrile overnight and hemodynamically stable. On my exam he is nontoxic appearing. Will watch him one more day on IV antibiotic therapy with vancomycin and Zosyn. -Repeat chest x-ray showing stable infiltrate. -On 02/02/2015 empiric IV antibiotic therapy was discontinued as he was started on Levaquin 500 milligrams by mouth daily.  3. Pulmonary fibrosis. -During this hospitalization he has had an increase in oxygen requirement which could be related to worsening pneumonia. -Patient will be discharged on Levaquin, recommend 7 days of treatment.  4. Atrial fibrillation. -Beta blocker was held to allow for permissive hypertension given presentation of stroke. -Ventricular rates remained stable in the 90s. -As mentioned above anticoagulation was changed to Eliquis.   5. Dyslipidemia -Continue statin therapy  Procedures:  Panda Tube placement  Consultations:  Neurology  Discharge Exam: Filed Vitals:   02/02/15 1040  BP: 114/56  Pulse: 90  Temp: 97.7 F (36.5 C)  Resp: 17     General: Patient is awake and alert, continues to have dysarthria, nontoxic appearing. He has ongoing dysarthria  Cardiovascular: Irregular rate and rhythm normal S1-S2 no murmurs rubs or gallops  Respiratory: Bibasilar crackles, diminished breath sounds bilaterally, overall lung exam similar to yesterday's valuation  Abdomen: Soft nontender nondistended  Musculoskeletal: No edema  Neurological. Patient continues to have significant  dysarthria, answering "yes" to most of my questions, otherwise had 5/5 muscle strength to bilateral upper and lower extremities.   Discharge Instructions   Discharge Instructions    Ambulatory referral to Neurology    Complete by:  As directed   Pt will follow up with Dr. Erlinda Hong at [facility] in about 2 months. Thanks.     Call MD for:  difficulty breathing, headache or visual disturbances    Complete by:  As directed      Call MD for:  extreme fatigue    Complete by:  As directed      Call MD for:  hives    Complete by:  As directed      Call MD for:  persistant dizziness or light-headedness    Complete by:  As directed      Call MD for:  persistant nausea and vomiting    Complete by:  As directed      Call MD for:  redness, tenderness, or signs of infection (pain, swelling, redness, odor or green/yellow discharge around incision site)    Complete by:  As directed      Call MD for:  severe uncontrolled pain    Complete by:  As directed      Call MD for:  temperature >100.4    Complete by:  As directed      Call MD for:    Complete by:  As directed      Diet - low sodium heart healthy  Complete by:  As directed      Increase activity slowly    Complete by:  As directed           Current Discharge Medication List    START taking these medications   Details  apixaban (ELIQUIS) 5 MG TABS tablet Take 1 tablet (5 mg total) by mouth 2 (two) times daily. Qty: 60 tablet, Refills: 1    levofloxacin (LEVAQUIN) 25 MG/ML solution Place 20 mLs (500 mg total) into feeding tube daily. Qty: 75 mL, Refills: 0    Nutritional Supplements (FEEDING SUPPLEMENT, JEVITY 1.2 CAL,) LIQD Place 1,000 mLs into feeding tube continuous. Qty: 1500 mL, Refills: 0    pravastatin (PRAVACHOL) 10 MG tablet Take 1 tablet (10 mg total) by mouth daily at 6 PM. Qty: 30 tablet, Refills: 1    Water For Irrigation, Sterile (FREE WATER) SOLN Place 90 mLs into feeding tube every 4 (four) hours. Qty: 1500 mL, Refills: 1       CONTINUE these medications which have NOT CHANGED   Details  acetaminophen (TYLENOL) 325 MG tablet Take 650 mg by mouth as needed for pain.    azelastine (ASTELIN) 0.1 % nasal spray Place 1 spray into both nostrils 2 (two) times daily as needed for allergies. Use in each nostril as directed    busPIRone (BUSPAR) 15 MG tablet Take 1 tablet (15 mg total) by mouth 2 (two) times daily. Qty: 180 tablet, Refills: 0    clonazePAM (KLONOPIN) 0.5 MG tablet Take 0.5 tablets (0.25 mg total) by mouth 3 (three) times daily as needed for anxiety. Qty: 40 tablet, Refills: 0    Cyanocobalamin (NASCOBAL) 500 MCG/0.1ML SOLN Use one spray nasally per week as directed. Qty: 3 Bottle, Refills: 2    famotidine (PEPCID) 40 MG tablet Take 1 tablet (40 mg total) by mouth daily. Qty: 30 tablet, Refills: 0    gemfibrozil (LOPID) 600 MG tablet Take 1 tablet (600 mg total) by mouth 2 (two) times daily. Qty: 180 tablet, Refills: 3    mesalamine (PENTASA) 250 MG CR capsule Take 2 capsules (500 mg total) by mouth 2 (two) times daily. Qty: 360 capsule, Refills: 3    Pirfenidone 267 MG CAPS Take 3 capsules by mouth 3 (three) times daily.    guaiFENesin-dextromethorphan (ROBITUSSIN DM) 100-10 MG/5ML syrup Take 5 mLs by mouth every 4 (four) hours as needed for cough. Qty: 236 mL, Refills: 0    OXYGEN Inhale into the lungs. 2 liters    psyllium (METAMUCIL SMOOTH TEXTURE) 28 % packet Take 1 packet by mouth daily as needed. For constipation      STOP taking these medications     amLODipine (NORVASC) 5 MG tablet      amoxicillin-clavulanate (AUGMENTIN) 875-125 MG tablet      calcium citrate (CALCITRATE - DOSED IN MG ELEMENTAL CALCIUM) 950 MG tablet      nebivolol (BYSTOLIC) 5 MG tablet      predniSONE (DELTASONE) 20 MG tablet      Vitamin D, Ergocalciferol, (DRISDOL) 50000 UNITS CAPS capsule      warfarin (COUMADIN) 2 MG tablet        No Known Allergies Follow-up Information    Follow up with  Xu,Jindong, MD. Schedule an appointment as soon as possible for a visit in 2 months.   Specialty:  Neurology   Why:  stroke clinic   Contact information:   [address] [place] [address] [phone]  The results of significant diagnostics from this hospitalization (including imaging, microbiology, ancillary and laboratory) are listed below for reference.    Significant Diagnostic Studies: Ct Angio Head W/cm &/or Wo Cm  01/31/2015  CLINICAL DATA:  Right facial droop, right-sided weakness, and slurred speech. EXAM: CT ANGIOGRAPHY HEAD AND NECK TECHNIQUE: Multidetector CT imaging of the head and neck was performed using the standard protocol during bolus administration of intravenous contrast. Multiplanar CT image reconstructions and MIPs were obtained to evaluate the vascular anatomy. Carotid stenosis measurements (when applicable) are obtained utilizing NASCET criteria, using the distal internal carotid diameter as the denominator. CONTRAST:  14mL OMNIPAQUE IOHEXOL 350 MG/ML SOLN COMPARISON:  Noncontrast head CT earlier today. Chest radiographs 01/26/2015. FINDINGS: CTA NECK Aortic arch: 3 vessel aortic arch with mild atherosclerotic plaque. Brachiocephalic and subclavian arteries are patent without significant stenosis. Right carotid system: Patent with minimal non stenotic plaque at the carotid bifurcation. Left carotid system: Patent without stenosis or significant atherosclerosis. Vertebral arteries: Patent without significant stenosis. Left vertebral artery is mildly dominant. Skeleton: Moderate multilevel cervical disc and facet degeneration. Other neck: Extensive consolidation is partially visualized in the right upper lobe as seen on recent chest radiographs, with areas of cavitation. This is superimposed on a background of advanced centrilobular emphysema. There are multiple enlarged upper mediastinal lymph nodes predominantly in the right paratracheal region which  measure up to 1.2 cm in short axis. CTA HEAD Anterior circulation: Internal carotid arteries are patent from skullbase to carotid termini with minimal atherosclerotic calcification but no stenosis. There is a 2 mm left posterior communicating artery region infundibulum versus aneurysm. M1 segments are widely patent. MCA bifurcations are patent. There is occlusion of a left M2 inferior division branch vessel approximately 1.5 cm distal to the MCA bifurcation (series 9, image 88 and series 11, image 117). ACAs are patent without significant stenosis. Posterior circulation: Intracranial vertebral arteries are patent with the left being dominant. Right vertebral artery is markedly hypoplastic distal to the PICA origin. SCA origins are patent. Basilar artery is diffusely small in caliber on a developmental basis without significant superimposed focal stenosis. There is a fetal origin of the right PCA. PCAs are patent without evidence of significant stenosis. Venous sinuses: Patent. Anatomic variants: Fetal origin of the right PCA. IMPRESSION: 1. No large vessel occlusion. 2. Left M2 MCA branch vessel occlusion. 3. No cervical carotid or vertebral artery stenosis. 4. 2 mm left posterior communicating artery infundibulum versus aneurysm. 5. Extensive right upper lobe lung consolidation with areas of cavitation, incompletely visualized. Mild mediastinal lymphadenopathy. These results were called by telephone at the time of interpretation on 01/31/2015 at 10:30 am to [place] , who verbally acknowledged these results. Electronically Signed   By: Logan Bores M.D.   On: 01/31/2015 10:35   Dg Chest 2 View  01/26/2015  CLINICAL DATA:  Acute shortness of breath for 1 week. History of pulmonary fibrosis and COPD EXAM: CHEST  2 VIEW COMPARISON:  06/07/2013 FINDINGS: New patchy right upper lobe airspace opacity abutting the fissure compatible with superimposed pneumonia. Background COPD/ emphysema evident with chronic  basilar pulmonary fibrosis. Stable left midline calcified granuloma. Chronic apical scarring evident. Stable heart size and vascularity. No associated effusion or edema pattern. Trachea is midline. No acute osseous finding. IMPRESSION: Patchy right upper lobe pneumonia superimposed on chronic COPD and basilar fibrosis Remote granulomatous disease Recommend short-term radiographic follow-up after medical therapy to document resolution. Electronically Signed   By: Jerilynn Mages.  Shick M.D.  On: 01/26/2015 10:33   Ct Head Wo Contrast  01/31/2015  CLINICAL DATA:  Code stroke. Right-sided weakness with facial droop and slurred speech EXAM: CT HEAD WITHOUT CONTRAST TECHNIQUE: Contiguous axial images were obtained from the base of the skull through the vertex without intravenous contrast. COMPARISON:  None. FINDINGS: Ventricle size normal.  Cerebral volume normal. Mild patchy hypodensity in the cerebral white matter bilaterally, most consistent with ischemia of chronic duration. Negative for acute infarct. Negative for hemorrhage or mass. Mild calcification in the basal ganglia bilaterally. Normal arterial density. Calvarium intact. IMPRESSION: Mild chronic microvascular ischemia.  No acute abnormality. Critical Value/emergent results were called by telephone at the time of interpretation on 01/31/2015 at 9:39 am to Dr. Armida Sans, who verbally acknowledged these results. Electronically Signed   By: Franchot Gallo M.D.   On: 01/31/2015 09:39   Ct Angio Neck W/cm &/or Wo/cm  01/31/2015  CLINICAL DATA:  Right facial droop, right-sided weakness, and slurred speech. EXAM: CT ANGIOGRAPHY HEAD AND NECK TECHNIQUE: Multidetector CT imaging of the head and neck was performed using the standard protocol during bolus administration of intravenous contrast. Multiplanar CT image reconstructions and MIPs were obtained to evaluate the vascular anatomy. Carotid stenosis measurements (when applicable) are obtained utilizing NASCET criteria, using  the distal internal carotid diameter as the denominator. CONTRAST:  33mL OMNIPAQUE IOHEXOL 350 MG/ML SOLN COMPARISON:  Noncontrast head CT earlier today. Chest radiographs 01/26/2015. FINDINGS: CTA NECK Aortic arch: 3 vessel aortic arch with mild atherosclerotic plaque. Brachiocephalic and subclavian arteries are patent without significant stenosis. Right carotid system: Patent with minimal non stenotic plaque at the carotid bifurcation. Left carotid system: Patent without stenosis or significant atherosclerosis. Vertebral arteries: Patent without significant stenosis. Left vertebral artery is mildly dominant. Skeleton: Moderate multilevel cervical disc and facet degeneration. Other neck: Extensive consolidation is partially visualized in the right upper lobe as seen on recent chest radiographs, with areas of cavitation. This is superimposed on a background of advanced centrilobular emphysema. There are multiple enlarged upper mediastinal lymph nodes predominantly in the right paratracheal region which measure up to 1.2 cm in short axis. CTA HEAD Anterior circulation: Internal carotid arteries are patent from skullbase to carotid termini with minimal atherosclerotic calcification but no stenosis. There is a 2 mm left posterior communicating artery region infundibulum versus aneurysm. M1 segments are widely patent. MCA bifurcations are patent. There is occlusion of a left M2 inferior division branch vessel approximately 1.5 cm distal to the MCA bifurcation (series 9, image 88 and series 11, image 117). ACAs are patent without significant stenosis. Posterior circulation: Intracranial vertebral arteries are patent with the left being dominant. Right vertebral artery is markedly hypoplastic distal to the PICA origin. SCA origins are patent. Basilar artery is diffusely small in caliber on a developmental basis without significant superimposed focal stenosis. There is a fetal origin of the right PCA. PCAs are patent  without evidence of significant stenosis. Venous sinuses: Patent. Anatomic variants: Fetal origin of the right PCA. IMPRESSION: 1. No large vessel occlusion. 2. Left M2 MCA branch vessel occlusion. 3. No cervical carotid or vertebral artery stenosis. 4. 2 mm left posterior communicating artery infundibulum versus aneurysm. 5. Extensive right upper lobe lung consolidation with areas of cavitation, incompletely visualized. Mild mediastinal lymphadenopathy. These results were called by telephone at the time of interpretation on 01/31/2015 at 10:30 am to [place] , who verbally acknowledged these results. Electronically Signed   By: Logan Bores M.D.   On: 01/31/2015 10:35  Brian Jodene Nam Head Wo Contrast  01/31/2015  CLINICAL DATA:  Since change in speech at 8 o\'clock a.m. Right arm weakness. Right facial droop. A aphasia. Right arm weakness has improved. EXAM: MRI HEAD WITHOUT CONTRAST MRA HEAD WITHOUT CONTRAST TECHNIQUE: Multiplanar, multiecho pulse sequences of the brain and surrounding structures were obtained without intravenous contrast. Angiographic images of the head were obtained using MRA technique without contrast. COMPARISON:  CT head without contrast in CTA head and neck from the same day. FINDINGS: MRI HEAD FINDINGS Acute nonhemorrhagic posterior left MCA territory infarct is confirmed. This involves the left sylvian fissure extends superiorly within the left parietal lobe. There is at least 1 additional punctate cortical infarct in the left parietal lobe on image 31 of series 7. T2 changes are evident within the area of acute infarction. Additional subcortical and periventricular T2 changes are evident bilaterally. Flow is present in the major intracranial arteries. Bilateral lens replacements are present. The paranasal sinuses mastoid air cells are clear. MRA HEAD FINDINGS Internal carotid arteries are within normal limits from the high cervical segments through the ICA termini bilaterally. The A1 and  M1 segments are normal. Anterior communicating artery is patent. ACA branch vessels are within normal limits. The MCA bifurcations are intact bilaterally. Right MCA branch vessels are normal. A distal posterior left MCA branch vessel occlusion is noted. One additional left M3 branch vessels opacified compared to the earlier CTA. The left vertebral artery is the dominant vessel. The right PICA origin is visualized and normal. AICA vessels are noted bilaterally. The left posterior cerebral artery originates from the basilar tip. The right posterior cerebral artery originates from the basilar tip and a posterior communicating artery. The PCA branch vessels are intact. IMPRESSION: 1. Acute nonhemorrhagic posterior left MCA territory infarct is confirmed. 2. Occlusion of distal left posterior MCA branch vessel. There is opacification of an additional left MCA branch vessels since the earlier CTA study. 3. Extensive white matter disease is present in addition to the acute infarct. Electronically Signed   By: San Morelle M.D.   On: 01/31/2015 16:09   Brian Brain Wo Contrast  01/31/2015  CLINICAL DATA:  Since change in speech at 8 o\'clock a.m. Right arm weakness. Right facial droop. A aphasia. Right arm weakness has improved. EXAM: MRI HEAD WITHOUT CONTRAST MRA HEAD WITHOUT CONTRAST TECHNIQUE: Multiplanar, multiecho pulse sequences of the brain and surrounding structures were obtained without intravenous contrast. Angiographic images of the head were obtained using MRA technique without contrast. COMPARISON:  CT head without contrast in CTA head and neck from the same day. FINDINGS: MRI HEAD FINDINGS Acute nonhemorrhagic posterior left MCA territory infarct is confirmed. This involves the left sylvian fissure extends superiorly within the left parietal lobe. There is at least 1 additional punctate cortical infarct in the left parietal lobe on image 31 of series 7. T2 changes are evident within the area of acute  infarction. Additional subcortical and periventricular T2 changes are evident bilaterally. Flow is present in the major intracranial arteries. Bilateral lens replacements are present. The paranasal sinuses mastoid air cells are clear. MRA HEAD FINDINGS Internal carotid arteries are within normal limits from the high cervical segments through the ICA termini bilaterally. The A1 and M1 segments are normal. Anterior communicating artery is patent. ACA branch vessels are within normal limits. The MCA bifurcations are intact bilaterally. Right MCA branch vessels are normal. A distal posterior left MCA branch vessel occlusion is noted. One additional left M3 branch vessels opacified compared to  the earlier CTA. The left vertebral artery is the dominant vessel. The right PICA origin is visualized and normal. AICA vessels are noted bilaterally. The left posterior cerebral artery originates from the basilar tip. The right posterior cerebral artery originates from the basilar tip and a posterior communicating artery. The PCA branch vessels are intact. IMPRESSION: 1. Acute nonhemorrhagic posterior left MCA territory infarct is confirmed. 2. Occlusion of distal left posterior MCA branch vessel. There is opacification of an additional left MCA branch vessels since the earlier CTA study. 3. Extensive white matter disease is present in addition to the acute infarct. Electronically Signed   By: San Morelle M.D.   On: 01/31/2015 16:09   Dg Chest Port 1 View  02/02/2015  CLINICAL DATA:  Pneumonia. EXAM: PORTABLE CHEST 1 VIEW COMPARISON:  01/31/2015.  06/07/2013. FINDINGS: Mediastinum hilar structures are normal. Cardiomegaly. Right upper lobe infiltrate again noted consistent with pneumonia. No interim change. Mild increase in pulmonary interstitial markings noted in the lung bases. These changes could be related interstitial edema and/or pneumonitis. A component interstitial prominence in the lung bases is most likely  related to chronic interstitial lung disease. No prominent pleural effusion or pneumothorax. IMPRESSION: 1. Persistent right upper lobe infiltrate consistent with pneumonia. No significant interim improvement. 2. Cardiomegaly with increase in pulmonary interstitial prominence in the lung bases. Congestive heart failure cannot be excluded. Pneumonitis cannot be excluded. A component of the interstitial prominence in the lung bases is also most likely related chronic interstitial lung disease. Electronically Signed   By: Marcello Moores  Register   On: 02/02/2015 07:23   Dg Chest Port 1 View  01/31/2015  CLINICAL DATA:  Pneumonia and call EXAM: PORTABLE CHEST 1 VIEW COMPARISON:  Radiograph 01/26/2015 FINDINGS: Normal cardiac silhouette. There is airspace opacity in the RIGHT upper lobe which is increased slightly in density. Mild airspace disease in the RIGHT lower lobe appears chronic. Underlying severe emphysematous change in the RIGHT upper lobe. IMPRESSION: Slight worsening of RIGHT upper lobe pneumonia superimposed on emphysematous change. Electronically Signed   By: Suzy Bouchard M.D.   On: 01/31/2015 11:42   Dg Abd Portable 1v  02/02/2015  CLINICAL DATA:  NG tube placement. EXAM: PORTABLE ABDOMEN - 1 VIEW COMPARISON:  CT abdomen and pelvis 07/14/2013 FINDINGS: A weighted tip enteric tube is present and terminates in the expected region of the proximal third portion of the duodenum. Gas is present in nondilated loops of small and large bowel without evidence of obstruction, although the lower abdomen/ pelvis was not imaged. Right upper quadrant abdominal surgical clips are noted. Right upper lobe lung consolidation is more fully evaluated on chest radiograph earlier today. IMPRESSION: Feeding tube terminates in the region of the proximal third portion of the duodenum. Electronically Signed   By: Logan Bores M.D.   On: 02/02/2015 12:49   Dg Swallowing Func-speech Pathology  02/01/2015  Objective Swallowing  Evaluation:   Patient Details Name: DERMAINE CHABRA MRN: [id number] Date of Birth: 07-21-1928 Today's Date: 02/01/2015 Time: SLP Start Time (ACUTE ONLY): 0945-SLP Stop Time (ACUTE ONLY): 1010 SLP Time Calculation (min) (ACUTE ONLY): 25 min Past Medical History: Past Medical History Diagnosis Date . Muscle tremor    saw neurology remotely elsewhere, was Rx inderal . Crohn's ileocolitis ([place])  . Osteopenia    per DEXA 11/2008 . Hypertension 11/11   mild . Hemorrhoids  . Hypertrophy of prostate    w/o UR obst & oth luts . Peripheral neuropathy ([facility])    h/o neg  w/u . ED (erectile dysfunction)  . Hypertriglyceridemia  . COPD (chronic obstructive pulmonary disease) ([place])    recent dx--no acute problems . Hiatal hernia 1956   found while in service . Anxiety  . Atrial fibrillation with RVR ([place])  . Long term (current) use of anticoagulants 02/21/2011 . Serrated adenoma of colon 03/2010 . Renal cyst    bilateral . Pulmonary fibrosis ([place]) 2015   Rx Esbriet ~ 10-2013 ([name]), Dr. [name] . On home oxygen therapy    "2L; 20-24h for the past week" (01/26/2015) . Emphysema lung ([place]) 2016   Dr. [name] . B12 deficiency anemia  . Idiopathic pulmonary fibrosis ([place]) 2016   Dr. [name] . Pneumonia ?2015 . CAP (community acquired pneumonia) 01/26/2015 . Osteoarthritis    "maybe in my hands, feet" (01/26/2015) . Depression  . Squamous cell carcinoma of skin of scalp    tx'd ~ 61yrs; "froze them off" . Squamous cell carcinoma, face     "froze them off" . Melanoma of scalp [facility])  Past Surgical History: Past Surgical History Procedure Laterality Date . Colon resection  1984   resection of distal ileum and cecum w/ appendectomy, 18-inch small intestine, for Crohn's disease . Colon surgery   . Appendectomy  1984 . Cataract extraction w/ intraocular lens  implant, bilateral Bilateral 2007 . Cholecystectomy  02/17/2011   Procedure: LAPAROSCOPIC CHOLECYSTECTOMY WITH INTRAOPERATIVE CHOLANGIOGRAM;  Surgeon: Edward Jolly, MD;  Location: [facility];   Service: General;  Laterality: N/A; . Mohs surgery Right ~ 2014   "side of my scalp" HPI: 81 y.o. male with pulmonary fibrosis, atrial fibrillation, Chron's, HTN, COPD, hiatal hernia and pna 01/26/15 admitted with right side weakness and inability to speak. CT negative, MRI showed acute nonhemorrhagic posterior left MCA territory infarct. CXR Slight worsening of RIGHT upper lobe pneumonia superimposed on emphysematous change. BSE 11/4 without difficulty and regular/thin recommended. Subjective: pleasant, talkative Assessment / Plan / Recommendation CHL IP CLINICAL IMPRESSIONS 02/01/2015 Therapy Diagnosis Severe pharyngeal phase dysphagia Clinical Impression Pt demonstrated severe oropharyngeal neuromuscular dysphagia characterized by sensorimotor impariment. Pt has adequate oral movement, however due reduced sensation and muscular control results in premature spillage. Pt's pharyngeal phase is characterized by reduced hyolaryngeal elevation resulting in reduced epiglottic deflection and delayed swallow initation to the valleculae for puree and pyriforms for liquids, which results in unsensed penetration/aspiration of liquids. Pt did not sense aspiration until after it passed through his vocal cords.  Pt has significant pharyngeal residue across all consistences. Pt's global aphasia interferes with compensatory strategies and max verbal, tactile, and visual cues for second swallow to clear residue. While a chin tuck with mod-max assist reduced vallecular residue with thin liquids, SLP recommends pt remain NPO due to high risk of aspiration and risk of futher respiratory distress; pt has no functional reserve due to baseline pulmonary fibrosis and right upper lobe pneumonia prior to stroke.  Pt will benefit from CIR for intensive dysphagia and aphasia treatment. SLP will f/u with diagnostic assessment and potential repeat of objective assessment if swallow function has improved.    CHL IP TREATMENT RECOMMENDATION  02/01/2015 Treatment Recommendations Therapy as outlined in treatment plan below   CHL IP DIET RECOMMENDATION 02/01/2015 SLP Diet Recommendations NPO;Alternative means - temporary Liquid Administration via -- Medication Administration Via alternative means Compensations -- Postural Changes --   CHL IP OTHER RECOMMENDATIONS 02/01/2015 Recommended Consults -- Oral Care Recommendations Oral care QID Other Recommendations --   CHL IP FOLLOW UP RECOMMENDATIONS 02/01/2015 Follow up Recommendations Inpatient Rehab  CHL IP FREQUENCY AND DURATION 02/01/2015 Speech Therapy Frequency (ACUTE ONLY) min 3x week Treatment Duration --      CHL IP ORAL PHASE 02/01/2015 Oral Phase Impaired Oral - Pudding Teaspoon -- Oral - Pudding Cup -- Oral - Honey Teaspoon -- Oral - Honey Cup -- Oral - Nectar Teaspoon -- Oral - Nectar Cup Delayed oral transit;Premature spillage Oral - Nectar Straw Delayed oral transit;Premature spillage Oral - Thin Teaspoon -- Oral - Thin Cup Delayed oral transit;Premature spillage Oral - Thin Straw Delayed oral transit;Premature spillage Oral - Puree Delayed oral transit;Premature spillage Oral - Mech Soft -- Oral - Regular -- Oral - Multi-Consistency -- Oral - Pill -- Oral Phase - Comment --  CHL IP PHARYNGEAL PHASE 02/01/2015 Pharyngeal Phase Nectar;Thin;Solids Pharyngeal- Pudding Teaspoon -- Pharyngeal -- Pharyngeal- Pudding Cup -- Pharyngeal -- Pharyngeal- Honey Teaspoon -- Pharyngeal -- Pharyngeal- Honey Cup -- Pharyngeal -- Pharyngeal- Nectar Teaspoon NT Pharyngeal -- Pharyngeal- Nectar Cup Delayed swallow initiation-vallecula;Delayed swallow initiation-pyriform sinuses;Reduced epiglottic inversion;Penetration/Aspiration during swallow;Penetration/Aspiration before swallow;Trace aspiration;Pharyngeal residue - valleculae;Pharyngeal residue - pyriform Pharyngeal Material enters airway, remains ABOVE vocal cords and not ejected out Pharyngeal- Nectar Straw Delayed swallow initiation-vallecula;Delayed swallow  initiation-pyriform sinuses;Reduced epiglottic inversion;Penetration/Aspiration during swallow;Penetration/Apiration after swallow;Trace aspiration;Pharyngeal residue - valleculae;Pharyngeal residue - pyriform Pharyngeal Material enters airway, remains ABOVE vocal cords and not ejected out Pharyngeal- Thin Teaspoon NT Pharyngeal -- Pharyngeal- Thin Cup Delayed swallow initiation-vallecula;Delayed swallow initiation-pyriform sinuses;Reduced epiglottic inversion;Penetration/Aspiration during swallow;Penetration/Apiration after swallow;Trace aspiration;Pharyngeal residue - valleculae;Pharyngeal residue - pyriform Pharyngeal Material enters airway, passes BELOW cords without attempt by patient to eject out (silent aspiration);Material enters airway, passes BELOW cords and not ejected out despite cough attempt by patient Pharyngeal- Thin Straw Delayed swallow initiation-vallecula;Delayed swallow initiation-pyriform sinuses;Reduced epiglottic inversion;Penetration/Aspiration during swallow;Penetration/Apiration after swallow;Trace aspiration;Pharyngeal residue - valleculae;Pharyngeal residue - pyriform Pharyngeal Material enters airway, passes BELOW cords without attempt by patient to eject out (silent aspiration);Material enters airway, passes BELOW cords and not ejected out despite cough attempt by patient Pharyngeal- Puree Delayed swallow initiation-vallecula;Reduced epiglottic inversion;Pharyngeal residue - valleculae;Pharyngeal residue - pyriform Pharyngeal -- Pharyngeal- Mechanical Soft NT Pharyngeal -- Pharyngeal- Regular NT Pharyngeal -- Pharyngeal- Multi-consistency NT Pharyngeal -- Pharyngeal- Pill NT Pharyngeal -- Pharyngeal Comment --  CHL IP CERVICAL ESOPHAGEAL PHASE 02/01/2015 Cervical Esophageal Phase WFL Pudding Teaspoon -- Pudding Cup -- Honey Teaspoon -- Honey Cup -- Nectar Teaspoon -- Nectar Cup -- Nectar Straw -- Thin Teaspoon -- Thin Cup -- Thin Straw -- Puree -- Mechanical Soft -- Regular --  Multi-consistency -- Pill -- Cervical Esophageal Comment -- Completed by Lanier Ensign, SLP Student Supervised and reviewed by Herbie Baltimore MA CCC-SLP [place], Katherene Ponto 02/01/2015, 2:43 PM               Microbiology: Recent Results (from the past 240 hour(s))  Culture, blood (routine x 2)     Status: None   Collection Time: 01/26/15  1:05 PM  Result Value Ref Range Status   Specimen Description BLOOD RIGHT WRIST  Final   Special Requests IN PEDIATRIC BOTTLE 4CC  Final   Culture NO GROWTH 5 DAYS  Final   Report Status 01/31/2015 FINAL  Final  Culture, blood (routine x 2)     Status: None   Collection Time: 01/26/15  1:09 PM  Result Value Ref Range Status   Specimen Description BLOOD RIGHT HAND  Final   Special Requests IN PEDIATRIC BOTTLE 4CC  Final   Culture NO GROWTH 5 DAYS  Final   Report Status 01/31/2015 FINAL  Final  Culture, blood (routine x 2) Call MD if unable to obtain prior to antibiotics being given     Status: None (Preliminary result)   Collection Time: 01/31/15  7:37 PM  Result Value Ref Range Status   Specimen Description BLOOD LEFT FOREARM  Final   Special Requests BOTTLES DRAWN AEROBIC AND ANAEROBIC 5CC   Final   Culture NO GROWTH < 24 HOURS  Final   Report Status PENDING  Incomplete  Culture, blood (routine x 2) Call MD if unable to obtain prior to antibiotics being given     Status: None (Preliminary result)   Collection Time: 01/31/15  7:40 PM  Result Value Ref Range Status   Specimen Description BLOOD LEFT HAND  Final   Special Requests BOTTLES DRAWN AEROBIC ONLY 8CC  Final   Culture NO GROWTH < 24 HOURS  Final   Report Status PENDING  Incomplete     Labs: Basic Metabolic Panel:  Recent Labs Lab 01/27/15 0331 01/31/15 0909 01/31/15 0917 02/02/15 0550  NA 137 140 142 137  K 3.8 4.0 4.0 3.0*  CL 104 107 105 100*  CO2 25 25  --  27  GLUCOSE 112* 96 94 123*  BUN 13 17 20 12   CREATININE 0.86 0.83 0.80 0.89  CALCIUM 8.4* 9.0  --  8.3*  MG  1.6*  --   --   --    Liver Function Tests:  Recent Labs Lab 01/31/15 0909  AST 54*  ALT 66*  ALKPHOS 118  BILITOT 0.5  PROT 6.6  ALBUMIN 2.1*   No results for input(s): LIPASE, AMYLASE in the last 168 hours. No results for input(s): AMMONIA in the last 168 hours. CBC:  Recent Labs Lab 01/27/15 0331 01/31/15 0909 01/31/15 0917 02/02/15 0550  WBC 11.2* 14.3*  --  17.5*  NEUTROABS  --  11.8*  --   --   HGB 12.2* 14.0 15.0 12.5*  HCT 36.5* 40.6 44.0 37.1*  MCV 95.3 95.1  --  94.9  PLT 204 222  --  218   Cardiac Enzymes: No results for input(s): CKTOTAL, CKMB, CKMBINDEX, TROPONINI in the last 168 hours. BNP: BNP (last 3 results) No results for input(s): BNP in the last 8760 hours.  ProBNP (last 3 results) No results for input(s): PROBNP in the last 8760 hours.  CBG:  Recent Labs Lab 01/31/15 1000  GLUCAP 86       Signed:  Dayelin Balducci  [facility] 02/02/2015, 1:48 PM

## 2015-02-02 NOTE — Interval H&P Note (Signed)
Brian [name] was admitted today to Inpatient Rehabilitation with the diagnosis of left MCA infarct.  The patient's history has been reviewed, patient examined, and there is no change in status.  Patient continues to be appropriate for intensive inpatient rehabilitation.  I have reviewed the patient's chart and labs.  Questions were answered to the patient's satisfaction. The PAPE has been reviewed and assessment remains appropriate.  SWARTZ,ZACHARY T 02/02/2015, 6:53 PM

## 2015-02-02 NOTE — Progress Notes (Signed)
OT Cancellation Note and Discharge  Patient Details Name: Brian [name] MRN: [id number] DOB: 1928/06/12   Cancelled Treatment:    Reason Eval/Treat Not Completed: Other (comment). Pt per chart and RN to be D/C'd to CIR today--will defer OT eval to that venue.  Almon Register [id number] 02/02/2015, 3:54 PM

## 2015-02-02 NOTE — Progress Notes (Signed)
Gerlean Ren Rehab Admission Coordinator Signed Physical Medicine and Rehabilitation PMR Pre-admission 02/02/2015 2:36 PM  Related encounter: ED to Hosp-Admission (Current) from 01/31/2015 in [facility]   PMR Admission Coordinator Pre-Admission Assessment  Patient: Brian [name] is an 81 y.o., male MRN: [id number] DOB: 07-05-1928 Height:   Weight: 65.8 kg (145 lb 1 oz)  Insurance Information HMO:  PPO: PCP: IPA: 80/20: OTHER:  PRIMARY: Medicare Policy#: [id number] Subscriber: self CM Name: Phone#: Fax#:  Pre-Cert#: Employer: retired Benefits: Phone #: Name:  Eff. Date: 05/23/1995 Deduct: $1288 Out of Pocket Max: no Life Max: no CIR: 100% SNF: 100% first 20 days Outpatient: 80% Co-Pay: 20% Home Health: 100% Co-Pay:  DME: 80% Co-Pay: 20% Providers: Pt. choice SECONDARY: Mohammed Kindle Policy#: [id number] Subscriber: self CM Name: Phone#: Fax#:  Pre-Cert#: Employer:  Benefits: Phone #: Name:  Eff. Date: Deduct: Out of Pocket Max: Life Max:  CIR: SNF:  Outpatient: Co-Pay:  Home Health: Co-Pay:  DME: Co-Pay:   Medicaid Application Date: Case Manager:  Disability Application Date: Case Worker:   Emergency Contact Information Contact Information    Name Relation Home Work Mobile   Disbrow,Billie Spouse [phone]  [phone]   Maximous, Suire [phone]  [phone]     Current Medical History  Patient Admitting Diagnosis: left MCA infarct, embolic with gait, language, swallowing deficits History of Present Illness: Brian [name] is a 81 y.o.  right handed male with history of pulmonary fibrosis with home oxygen 2 L with exertion, atrial fibrillation on chronic Coumadin. This was spouse one level home one-step entry independent prior to admission. Presented 01/31/2015 with aphasia and right-sided weakness with facial droop. Cranial CT scan negative. CTA of head and neck showed no large vessel occlusion. INR upon admission of 1.9. 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 Occlusion of distal left posterior MCA branch vessel. Echocardiogram with ejection fraction of 60% no wall motion abnormalities. Patient did not receive TPA. Neurology follow-up Coumadin and transitioned to Eliquis for CVA prophylaxis as well as history of atrial fibrillation. Diet is currently nothing by mouth and nasogastric tube placed with follow-up speech therapy. Maintained on broad-spectrum antibiotics for possible healthcare associated pneumonia with chest x-ray 02/02/2015 showing persistent right upper lobe infiltrate and change to Levaquin 500 milligrams daily 02/02/2015. Patient has needed oxygen 6 L with ambulation with noted history of pulmonary fibrosis oxygen saturations 82%. Physical therapy evaluation completed 02/01/2015 with recommendations of physical medicine rehabilitation consult. Patient was admitted for comprehensive rehabilitation program Total: 8 [facility]    Past Medical History  Past Medical History  Diagnosis Date  . Muscle tremor     saw neurology remotely elsewhere, was Rx inderal  . Crohn's ileocolitis ([place])   . Osteopenia     per DEXA 11/2008  . Hypertension 11/11    mild  . Hemorrhoids   . Hypertrophy of prostate     w/o UR obst & oth luts  . Peripheral neuropathy ([facility])     h/o neg w/u  . ED (erectile dysfunction)   . Hypertriglyceridemia   . COPD (chronic obstructive pulmonary disease) ([place])     recent dx--no acute problems  . Hiatal hernia 1956    found  while in service  . Anxiety   . Atrial fibrillation with RVR ([place])   . Long term (current) use of anticoagulants 02/21/2011  . Serrated adenoma of colon 03/2010  . Renal cyst  bilateral  . Pulmonary fibrosis ([place]) 2015    Rx Esbriet ~ 10-2013 ([name]), Dr. [name]  . On home oxygen therapy     "2L; 20-24h for the past week" (01/26/2015)  . Emphysema lung ([place]) 2016    Dr. [name]  . B12 deficiency anemia   . Idiopathic pulmonary fibrosis ([place]) 2016    Dr. [name]  . Pneumonia ?2015  . CAP (community acquired pneumonia) 01/26/2015  . Osteoarthritis     "maybe in my hands, feet" (01/26/2015)  . Depression   . Squamous cell carcinoma of skin of scalp     tx'd ~ 39yrs; "froze them off"  . Squamous cell carcinoma, face     "froze them off"  . Melanoma of scalp ([place])     Family History  family history includes Breast cancer in his mother; Crohn's disease in his brother; Dementia in his sister; Diabetes in his sister; Heart attack (age of onset: 38) in his father; Stroke (age of onset: 1) in his mother. There is no history of Colon cancer or Prostate cancer.  Prior Rehab/Hospitalizations:  Has the patient had major surgery during 100 days prior to admission? No  Current Medications   Current facility-administered medications:  . apixaban (ELIQUIS) tablet 5 mg, 5 mg, Oral, BID, Cassie L Stewart, RPH, 5 mg at 02/01/15 2253 . azelastine (ASTELIN) 0.1 % nasal spray 1 spray, 1 spray, Each Nare, BID PRN, Melton Alar, PA-C . busPIRone (BUSPAR) tablet 15 mg, 15 mg, Oral, BID, Melton Alar, PA-C, 15 mg at 02/01/15 2251 . clonazePAM (KLONOPIN) tablet 0.25 mg, 0.25 mg, Oral, TID PRN, Bobby Rumpf York, PA-C . feeding supplement (JEVITY 1.2 CAL) liquid 1,000 mL, 1,000 mL, Per Tube, Continuous, Reanne J Barbato, RD . free water 90 mL, 90 mL, Per Tube, Q4H, Reanne Alvera Novel, RD . gemfibrozil (LOPID) tablet  600 mg, 600 mg, Oral, BID, Melton Alar, PA-C, 600 mg at 02/01/15 2251 . guaiFENesin-dextromethorphan (ROBITUSSIN DM) 100-10 MG/5ML syrup 5 mL, 5 mL, Oral, Q4H PRN, Bobby Rumpf York, PA-C . hydrALAZINE (APRESOLINE) injection 5-10 mg, 5-10 mg, Intravenous, Q4H PRN, Waldemar Dickens, MD . levofloxacin [facility]) 25 MG/ML solution 500 mg, 500 mg, Per Tube, Daily, Kelvin Cellar, MD . mesalamine (PENTASA) CR capsule 500 mg, 500 mg, Oral, BID, Melton Alar, PA-C, 500 mg at 02/01/15 2252 . Pirfenidone CAPS 3 capsule, 3 capsule, Oral, TID WC, Marianne L York, PA-C, 3 capsule at 02/01/15 1721 . potassium chloride SA (K-DUR,KLOR-CON) CR tablet 40 mEq, 40 mEq, Oral, Once, Kelvin Cellar, MD . pravastatin (PRAVACHOL) tablet 10 mg, 10 mg, Oral, q1800, Donzetta Starch, NP, 10 mg at 02/01/15 1722 . RESOURCE THICKENUP CLEAR, , Oral, PRN, Waldemar Dickens, MD  Patients Current Diet: Diet NPO time specified Diet - low sodium heart healthy  Precautions / Restrictions Precautions Precautions: Fall Restrictions Weight Bearing Restrictions: No   Has the patient had 2 or more falls or a fall with injury in the past year?No  Prior Activity Level Community (5-7x/wk): Pt. and wife are very active and go to the [facility] together 3 x /week. Pt. walks an average of 1 [place]/2 to [facility]   Home Assistive Devices / Equipment Home Assistive Devices/Equipment: Oxygen Home Equipment: None  Prior Device Use: Indicate devices/aids used by the patient prior to current illness, exacerbation or injury? None of the above  Prior Functional Level Prior Function Level of Independence: Independent Comments: Pt wears 02 PRN 2L/min 02. Pt  recently d/ced from hospital with diagnosis of PNA. Pt. still driving at time of admission. Enjoys woodworking.   Self Care: Did the patient need help bathing, dressing, using the toilet or eating? Independent  Indoor Mobility: Did the patient need assistance with  walking from room to room (with or without device)? Independent  Stairs: Did the patient need assistance with internal or external stairs (with or without device)? Independent  Functional Cognition: Did the patient need help planning regular tasks such as shopping or remembering to take medications? Independent  Current Functional Level Cognition  Arousal/Alertness: Awake/alert Overall Cognitive Status: Impaired/Different from baseline Difficult to assess due to: Impaired communication Orientation Level: Oriented to person Following Commands: Follows one step commands inconsistently, Follows one step commands with increased time (with gestures and manual cues.) General Comments: Pt able to lift UEs up in air gesturing therapist and kick out LEs imitating therapist.  Attention: Sustained Sustained Attention: Appears intact Awareness: Impaired Awareness Impairment: Intellectual impairment, Emergent impairment Behaviors: Perseveration   Extremity Assessment (includes Sensation/Coordination)  Upper Extremity Assessment: Defer to OT evaluation  Lower Extremity Assessment: Difficult to assess due to impaired cognition (Difficult to assess as pt not able to follow commands to participate in MMT. Able to left BLES off bed against gravity to donn socks and perform ambulation without knee buckling demonstrating functional strength.)    ADLs       Mobility  Overal bed mobility: Needs Assistance Bed Mobility: Supine to Sit, Sit to Supine Supine to sit: Supervision, HOB elevated Sit to supine: Supervision, HOB elevated General bed mobility comments: Used gesturing to get pt to come to EOB and return to supine with increased time.     Transfers  Overall transfer level: Needs assistance Equipment used: None Transfers: Sit to/from Stand Sit to Stand: Min assist General transfer comment: Min A to boost from EOB. Unsteady upon standing.    Ambulation / Gait / Stairs /  Wheelchair Mobility  Ambulation/Gait Ambulation/Gait assistance: Museum/gallery curator (Feet): 100 Feet Assistive device: 1 person hand held assist Gait Pattern/deviations: Step-through pattern, Decreased stride length General Gait Details: Short step lengths. Pt veering towards right side during ambulation; Min A for balance to prevent fall. Not able to follow directional cues. Sp02 decreased to 82% on 6L/min 02.  Gait velocity interpretation: <1.8 ft/sec, indicative of risk for recurrent falls    Posture / Balance Balance Overall balance assessment: Needs assistance Sitting-balance support: Feet supported, No upper extremity supported Sitting balance-Leahy Scale: Fair Standing balance support: During functional activity Standing balance-Leahy Scale: Poor Standing balance comment: Relient on external support for static and dynamic standing balance.     Special needs/care consideration BiPAP/CPAP no CPM no Continuous Drip IV no Dialysis no Days  Life Vest no Oxygen Yes, currently 4L/min nasal cannula Special Bed no Trach Size no Wound Vac (area) no Location no Skin WDL Per nursing assessment  Bowel mgmt Last BM 02/02/15; incontinence, diarrhea Bladder mgmt: uses urinal with assist; continent Diabetic mgmt A1c pending     Previous Home Environment Living Arrangements: Spouse/significant other Available Help at Discharge: Family, Available 24 hours/day Type of Home: House Home Layout: One level Home Access: Level entry [place]: No  Discharge Living Setting Plans for Discharge Living Setting: Patient's home Type of Home at Discharge: House Discharge Home Layout: One level Discharge Home Access: Stairs to enter Entrance Stairs-Rails: None Entrance Stairs-Number of Steps: 1/2 step to enter Discharge Bathroom Shower/Tub: Tub/shower unit, Pharmacologist Toilet:  Standard Discharge Bathroom Accessibility: Yes How Accessible: Accessible via walker Does the patient have any problems obtaining your medications?: No  Social/Family/Support Systems Patient Roles: Spouse, Parent, Other (Comment) (4 children, 13 grandchildren and 74 great grands) Contact Information: Phelps Eiche, wife Anticipated Caregiver: Lorik Boyea Anticipated Caregiver's Contact Information: [phone] Ability/Limitations of Caregiver: no limitations Caregiver Availability: 24/7 Discharge Plan Discussed with Primary Caregiver: Yes Is Caregiver In Agreement with Plan?: Yes Does Caregiver/Family have Issues with Lodging/Transportation while Pt is in Rehab?: No   Goals/Additional Needs Patient/Family Goal for Rehab: modified independent and supervision PT/OT; supervision, minimum and moderate assist for SLP Expected length of stay: 8-12 days Cultural Considerations: no Dietary Needs: NPO with panda tube Equipment Needs: TBA Additional Information: Today is pt and wife's 61st wedding anniversary Pt/Family Agrees to Admission and willing to participate: Yes Program Orientation Provided & Reviewed with Pt/Caregiver Including Roles & Responsibilities: Yes   Decrease burden of Care through IP rehab admission: no   Possible need for SNF placement upon discharge: Not anticipated   Patient Condition: This patient's condition remains as documented in the consult dated 02/02/15 , in which the Rehabilitation Physician determined and documented that the patient's condition is appropriate for intensive rehabilitative care in an inpatient rehabilitation facility. Will admit to inpatient rehab today.  Preadmission Screen Completed By: Gerlean Ren, 02/02/2015 2:37 PM ______________________________________________________________________  Discussed status with Dr. [name] on 02/02/15 at 1456 and received telephone approval for admission today.  Admission Coordinator:  Gerlean Ren, time [id number] Sudie Grumbling 02/02/15          Cosigned by: Meredith Staggers, MD at 02/02/2015 3:00 PM  Revision History     Date/Time User Provider Type Action   02/02/2015 3:00 PM Meredith Staggers, MD Physician Cosign   02/02/2015 2:57 PM Gerlean Ren Rehab Admission Coordinator Sign

## 2015-02-02 NOTE — H&P (Signed)
Physical Medicine and Rehabilitation Admission H&P    Chief Complaint  Patient presents with  . Code Stroke  : HPI: Brian [name] is a 81 y.o. right handed male with history of pulmonary fibrosis with home oxygen 2 L with exertion, atrial fibrillation on chronic Coumadin. This was spouse one level home one-step entry independent prior to admission. Presented 01/31/2015 with aphasia and right-sided weakness with facial droop. Cranial CT scan negative. CTA of head and neck showed no large vessel occlusion. INR upon admission of 1.9. 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 Occlusion of distal left posterior MCA branch vessel. Echocardiogram with ejection fraction of 60% no wall motion abnormalities. Patient did not receive TPA. Neurology follow-up Coumadin and transitioned to Eliquis for CVA prophylaxis as well as history of atrial fibrillation. Diet is currently nothing by mouth and nasogastric tube placed with follow-up speech therapy. Maintained on broad-spectrum antibiotics for possible healthcare associated pneumonia with chest x-ray 02/02/2015 showing persistent right upper lobe infiltrate and change to Levaquin 500 milligrams daily 02/02/2015. Patient has needed oxygen 6 L with ambulation with noted history of pulmonary fibrosis oxygen saturations 82%. Physical therapy evaluation completed 02/01/2015 with recommendations of physical medicine rehabilitation consult. Patient was admitted for comprehensive rehabilitation program  ROS Constitutional: Negative for fever and chills.  HENT: Negative for hearing loss.  Eyes: Negative for blurred vision and double vision.  Respiratory: Negative for cough.   Shortness of breath with exertion  Cardiovascular: Positive for palpitations and leg swelling. Negative for chest pain.  Gastrointestinal: Positive for constipation. Negative for nausea, vomiting and abdominal pain.  Genitourinary: Negative for dysuria and  hematuria.  Musculoskeletal: Positive for myalgias and joint pain.  Skin: Negative for rash.  Neurological: Positive for weakness. Negative for seizures, loss of consciousness and headaches.  Psychiatric/Behavioral: Positive for depression.   Anxiety  All other systems reviewed and are negative    Past Medical History  Diagnosis Date  . Muscle tremor     saw neurology remotely elsewhere, was Rx inderal  . Crohn's ileocolitis ([place])   . Osteopenia     per DEXA 11/2008  . Hypertension 11/11    mild  . Hemorrhoids   . Hypertrophy of prostate     w/o UR obst & oth luts  . Peripheral neuropathy (HCC)     h/o neg w/u  . ED (erectile dysfunction)   . Hypertriglyceridemia   . COPD (chronic obstructive pulmonary disease) ([place])     recent dx--no acute problems  . Hiatal hernia 1956    found while in service  . Anxiety   . Atrial fibrillation with RVR ([place])   . Long term (current) use of anticoagulants 02/21/2011  . Serrated adenoma of colon 03/2010  . Renal cyst     bilateral  . Pulmonary fibrosis ([place]) 2015    Rx Esbriet ~ 10-2013 ([name]), Dr. [name]  . On home oxygen therapy     "2L; 20-24h for the past week" (01/26/2015)  . Emphysema lung ([place]) 2016    Dr. [name]  . B12 deficiency anemia   . Idiopathic pulmonary fibrosis ([place]) 2016    Dr. [name]  . Pneumonia ?2015  . CAP (community acquired pneumonia) 01/26/2015  . Osteoarthritis     "maybe in my hands, feet" (01/26/2015)  . Depression   . Squamous cell carcinoma of skin of scalp     tx'd ~ 39yrs; "froze them off"  . Squamous cell carcinoma, face      "  froze them off"  . Melanoma of scalp [facility])    Past Surgical History  Procedure Laterality Date  . Colon resection  1984    resection of distal ileum and cecum w/ appendectomy, 18-inch small intestine, for Crohn's disease  . Colon surgery    . Appendectomy  1984  . Cataract extraction w/ intraocular lens  implant, bilateral Bilateral 2007  . Cholecystectomy   02/17/2011    Procedure: LAPAROSCOPIC CHOLECYSTECTOMY WITH INTRAOPERATIVE CHOLANGIOGRAM;  Surgeon: Edward Jolly, MD;  Location: [facility];  Service: General;  Laterality: N/A;  . Mohs surgery Right ~ 2014    "side of my scalp"   Family History  Problem Relation Age of Onset  . Crohn's disease Brother   . Breast cancer Mother   . Colon cancer Neg Hx   . Prostate cancer Neg Hx   . Heart attack Father 56  . Dementia Sister   . Diabetes Sister   . Stroke Mother 37   Social History:  reports that he quit smoking about 24 years ago. His smoking use included Cigarettes and Pipe. He has a 40 pack-year smoking history. He has never used smokeless tobacco. He reports that he does not drink alcohol or use illicit drugs. Allergies: No Known Allergies Medications Prior to Admission  Medication Sig Dispense Refill  . acetaminophen (TYLENOL) 325 MG tablet Take 650 mg by mouth as needed for pain.    Marland Kitchen amLODipine (NORVASC) 5 MG tablet Take 1 tablet (5 mg total) by mouth daily. 90 tablet 3  . amoxicillin-clavulanate (AUGMENTIN) 875-125 MG tablet Take 1 tablet by mouth 2 (two) times daily. 16 tablet 0  . azelastine (ASTELIN) 0.1 % nasal spray Place 1 spray into both nostrils 2 (two) times daily as needed for allergies. Use in each nostril as directed    . busPIRone (BUSPAR) 15 MG tablet Take 1 tablet (15 mg total) by mouth 2 (two) times daily. 180 tablet 0  . calcium citrate (CALCITRATE - DOSED IN MG ELEMENTAL CALCIUM) 950 MG tablet Take 200 mg of elemental calcium by mouth daily. Pt take QOD    . clonazePAM (KLONOPIN) 0.5 MG tablet Take 0.5 tablets (0.25 mg total) by mouth 3 (three) times daily as needed for anxiety. (Patient taking differently: Take 0.125 mg by mouth 3 (three) times daily as needed for anxiety. ) 40 tablet 0  . Cyanocobalamin (NASCOBAL) 500 MCG/0.1ML SOLN Use one spray nasally per week as directed. (Patient taking differently: Place 0.1 mLs into the nose every 7 (seven) days. Use one  spray nasally per week as directed.) 3 Bottle 2  . famotidine (PEPCID) 40 MG tablet Take 1 tablet (40 mg total) by mouth daily. 30 tablet 0  . gemfibrozil (LOPID) 600 MG tablet Take 1 tablet (600 mg total) by mouth 2 (two) times daily. 180 tablet 3  . mesalamine (PENTASA) 250 MG CR capsule Take 2 capsules (500 mg total) by mouth 2 (two) times daily. 360 capsule 3  . nebivolol (BYSTOLIC) 5 MG tablet Take 1 tablet (5 mg total) by mouth daily. 90 tablet 0  . Pirfenidone 267 MG CAPS Take 3 capsules by mouth 3 (three) times daily.    . predniSONE (DELTASONE) 20 MG tablet Take 2 tablets by mouth for 2 days; then 1 tablet by mouth for 3 days; then half tablet by mouth daily for 3 days and stop prednisone. 10 tablet 0  . Vitamin D, Ergocalciferol, (DRISDOL) 50000 UNITS CAPS capsule Take 50,000 Units by mouth every 7 (  seven) days.    Marland Kitchen warfarin (COUMADIN) 2 MG tablet Take 1 tablet (2 mg total) by mouth as directed. (Patient taking differently: Take 3-4 mg by mouth daily. Take 2 tablets on Monday, Wednesday, & Friday Take 1.5 tablets on all other days) 150 tablet 0  . guaiFENesin-dextromethorphan (ROBITUSSIN DM) 100-10 MG/5ML syrup Take 5 mLs by mouth every 4 (four) hours as needed for cough. 236 mL 0  . OXYGEN Inhale into the lungs. 2 liters    . psyllium (METAMUCIL SMOOTH TEXTURE) 28 % packet Take 1 packet by mouth daily as needed. For constipation      Home: Home Living Family/patient expects to be discharged to:: Inpatient rehab Living Arrangements: Spouse/significant other Available Help at Discharge: Family, Available 24 hours/day Type of Home: House Home Access: Level entry Home Layout: One level Home Equipment: None   Functional History: Prior Function Level of Independence: Independent Comments: Pt wears 02 PRN 2L/min 02. Pt recently d/ced from hospital with diagnosis of PNA.   Functional Status:  Mobility: Bed Mobility Overal bed mobility: Needs Assistance Bed Mobility: Supine to  Sit, Sit to Supine Supine to sit: Supervision, HOB elevated Sit to supine: Supervision, HOB elevated General bed mobility comments: Used gesturing to get pt to come to EOB and return to supine with increased time.  Transfers Overall transfer level: Needs assistance Equipment used: None Transfers: Sit to/from Stand Sit to Stand: Min assist General transfer comment: Min A to boost from EOB. Unsteady upon standing. Ambulation/Gait Ambulation/Gait assistance: Min assist Ambulation Distance (Feet): 100 Feet Assistive device: 1 person hand held assist Gait Pattern/deviations: Step-through pattern, Decreased stride length General Gait Details: Short step lengths. Pt veering towards right side during ambulation; Min A for balance to prevent fall. Not able to follow directional cues. Sp02 decreased to 82% on 6L/min 02.  Gait velocity interpretation: <1.8 ft/sec, indicative of risk for recurrent falls    ADL:    Cognition: Cognition Overall Cognitive Status: Impaired/Different from baseline Arousal/Alertness: Awake/alert Orientation Level: Oriented to person Attention: Sustained Sustained Attention: Appears intact Awareness: Impaired Awareness Impairment: Intellectual impairment, Emergent impairment Behaviors: Perseveration Cognition Arousal/Alertness: Awake/alert Behavior During Therapy: WFL for tasks assessed/performed Overall Cognitive Status: Impaired/Different from baseline Area of Impairment: Following commands Following Commands: Follows one step commands inconsistently, Follows one step commands with increased time (with gestures and manual cues.) General Comments: Pt able to lift UEs up in air gesturing therapist and kick out LEs imitating therapist.  Difficult to assess due to: Impaired communication  Physical Exam: Blood pressure 114/56, pulse 90, temperature 97.7 F (36.5 C), temperature source Oral, resp. rate 17, weight 65.8 kg (145 lb 1 oz), SpO2 95 %. Physical  Exam Constitutional: He appears well-developed. NAD, sitting EOB HENT: oral mucosa pink Head: Normocephalic.  Eyes: EOM are normal.  Neck: Normal range of motion. Neck supple. No thyromegaly present.  Cardiovascular:  Cardiac rate controlled without murmur, rub, gallop Respiratory: Effort normal and breath sounds normal. No wheezes, rales, rhonchi GI: Soft. Bowel sounds are normal. He exhibits no distension.  Neurological: He is alert.  Patient makes good eye contact with examiner. Expressive greater than receptive aphasia. Tends to perseverate on certain activities/ideas. Delayed cough/gag. Hearing appears to be intact. Tracks to all visual fields. Gross hearing intact. Moves bilateral UE 4/5 prox to disal, deltoid, biceps, triceps, wrist, fingers. . LE: 3+ HF,  4/5 KE and adf/apf. Senses pain in al 4 limbs. DTR's 1+ and no resting tone. Skin: Skin is warm and dry Psych:  anxious, perseverative at times. Impulsive. Generally pleasant however   Results for orders placed or performed during the hospital encounter of 01/31/15 (from the past 48 hour(s))  Influenza panel by pcr     Status: None   Collection Time: 01/31/15  6:50 PM  Result Value Ref Range   Influenza A By PCR NEGATIVE NEGATIVE   Influenza B By PCR NEGATIVE NEGATIVE   H1N1 flu by pcr NOT DETECTED NOT DETECTED    Comment:        The Xpert Flu assay (FDA approved for nasal aspirates or washes and nasopharyngeal swab specimens), is intended as an aid in the diagnosis of influenza and should not be used as a sole basis for treatment.   Culture, blood (routine x 2) Call MD if unable to obtain prior to antibiotics being given     Status: None (Preliminary result)   Collection Time: 01/31/15  7:37 PM  Result Value Ref Range   Specimen Description BLOOD LEFT FOREARM    Special Requests BOTTLES DRAWN AEROBIC AND ANAEROBIC 5CC     Culture NO GROWTH < 24 HOURS    Report Status PENDING   HIV antibody     Status: None    Collection Time: 01/31/15  7:37 PM  Result Value Ref Range   HIV Screen 4th Generation wRfx Non Reactive Non Reactive    Comment: (NOTE) Performed At: [facility] [place], [place] [id number] Lindon Romp MD [id number]   Culture, blood (routine x 2) Call MD if unable to obtain prior to antibiotics being given     Status: None (Preliminary result)   Collection Time: 01/31/15  7:40 PM  Result Value Ref Range   Specimen Description BLOOD LEFT HAND    Special Requests BOTTLES DRAWN AEROBIC ONLY 8CC    Culture NO GROWTH < 24 HOURS    Report Status PENDING   Lipid panel     Status: Abnormal   Collection Time: 02/01/15 10:50 AM  Result Value Ref Range   Cholesterol 103 0 - 200 mg/dL   Triglycerides 72 <150 mg/dL   HDL 33 (L) >40 mg/dL   Total CHOL/HDL Ratio 3.1 RATIO   VLDL 14 0 - 40 mg/dL   LDL Cholesterol 56 0 - 99 mg/dL    Comment:        Total Cholesterol/HDL:CHD Risk Coronary Heart Disease Risk Table                     Men   Women  1/2 Average Risk   3.4   3.3  Average Risk       5.0   4.4  2 X Average Risk   9.6   7.1  3 X Average Risk  23.4   11.0        Use the calculated Patient Ratio above and the CHD Risk Table to determine the patient's CHD Risk.        ATP III CLASSIFICATION (LDL):  <100     mg/dL   Optimal  100-129  mg/dL   Near or Above                    Optimal  130-159  mg/dL   Borderline  160-189  mg/dL   High  >190     mg/dL   Very High   CBC     Status: Abnormal   Collection Time: 02/02/15  5:50 AM  Result Value Ref Range  WBC 17.5 (H) 4.0 - 10.5 K/uL   RBC 3.91 (L) 4.22 - 5.81 MIL/uL   Hemoglobin 12.5 (L) 13.0 - 17.0 g/dL   HCT 37.1 (L) 39.0 - 52.0 %   MCV 94.9 78.0 - 100.0 fL   MCH 32.0 26.0 - 34.0 pg   MCHC 33.7 30.0 - 36.0 g/dL   RDW 12.7 11.5 - 15.5 %   Platelets 218 150 - 400 K/uL  Basic metabolic panel     Status: Abnormal   Collection Time: 02/02/15  5:50 AM  Result Value Ref Range   Sodium 137 135 - 145  mmol/L   Potassium 3.0 (L) 3.5 - 5.1 mmol/L    Comment: DELTA CHECK NOTED   Chloride 100 (L) 101 - 111 mmol/L   CO2 27 22 - 32 mmol/L   Glucose, Bld 123 (H) 65 - 99 mg/dL   BUN 12 6 - 20 mg/dL   Creatinine, Ser 0.89 0.61 - 1.24 mg/dL   Calcium 8.3 (L) 8.9 - 10.3 mg/dL   GFR calc non Af Amer >60 >60 mL/min   GFR calc Af Amer >60 >60 mL/min    Comment: (NOTE) The eGFR has been calculated using the CKD EPI equation. This calculation has not been validated in all clinical situations. eGFR's persistently <60 mL/min signify possible Chronic Kidney Disease.    Anion gap 10 5 - 15   Mr Virgel Paling Wo Contrast  01/31/2015  CLINICAL DATA:  Since change in speech at 8 o\'clock a.m. Right arm weakness. Right facial droop. A aphasia. Right arm weakness has improved. EXAM: MRI HEAD WITHOUT CONTRAST MRA HEAD WITHOUT CONTRAST TECHNIQUE: Multiplanar, multiecho pulse sequences of the brain and surrounding structures were obtained without intravenous contrast. Angiographic images of the head were obtained using MRA technique without contrast. COMPARISON:  CT head without contrast in CTA head and neck from the same day. FINDINGS: MRI HEAD FINDINGS Acute nonhemorrhagic posterior left MCA territory infarct is confirmed. This involves the left sylvian fissure extends superiorly within the left parietal lobe. There is at least 1 additional punctate cortical infarct in the left parietal lobe on image 31 of series 7. T2 changes are evident within the area of acute infarction. Additional subcortical and periventricular T2 changes are evident bilaterally. Flow is present in the major intracranial arteries. Bilateral lens replacements are present. The paranasal sinuses mastoid air cells are clear. MRA HEAD FINDINGS Internal carotid arteries are within normal limits from the high cervical segments through the ICA termini bilaterally. The A1 and M1 segments are normal. Anterior communicating artery is patent. ACA branch vessels  are within normal limits. The MCA bifurcations are intact bilaterally. Right MCA branch vessels are normal. A distal posterior left MCA branch vessel occlusion is noted. One additional left M3 branch vessels opacified compared to the earlier CTA. The left vertebral artery is the dominant vessel. The right PICA origin is visualized and normal. AICA vessels are noted bilaterally. The left posterior cerebral artery originates from the basilar tip. The right posterior cerebral artery originates from the basilar tip and a posterior communicating artery. The PCA branch vessels are intact. IMPRESSION: 1. Acute nonhemorrhagic posterior left MCA territory infarct is confirmed. 2. Occlusion of distal left posterior MCA branch vessel. There is opacification of an additional left MCA branch vessels since the earlier CTA study. 3. Extensive white matter disease is present in addition to the acute infarct. Electronically Signed   By: San Morelle M.D.   On: 01/31/2015 16:09   Mr  Brain Wo Contrast  01/31/2015  CLINICAL DATA:  Since change in speech at 8 o\'clock a.m. Right arm weakness. Right facial droop. A aphasia. Right arm weakness has improved. EXAM: MRI HEAD WITHOUT CONTRAST MRA HEAD WITHOUT CONTRAST TECHNIQUE: Multiplanar, multiecho pulse sequences of the brain and surrounding structures were obtained without intravenous contrast. Angiographic images of the head were obtained using MRA technique without contrast. COMPARISON:  CT head without contrast in CTA head and neck from the same day. FINDINGS: MRI HEAD FINDINGS Acute nonhemorrhagic posterior left MCA territory infarct is confirmed. This involves the left sylvian fissure extends superiorly within the left parietal lobe. There is at least 1 additional punctate cortical infarct in the left parietal lobe on image 31 of series 7. T2 changes are evident within the area of acute infarction. Additional subcortical and periventricular T2 changes are evident  bilaterally. Flow is present in the major intracranial arteries. Bilateral lens replacements are present. The paranasal sinuses mastoid air cells are clear. MRA HEAD FINDINGS Internal carotid arteries are within normal limits from the high cervical segments through the ICA termini bilaterally. The A1 and M1 segments are normal. Anterior communicating artery is patent. ACA branch vessels are within normal limits. The MCA bifurcations are intact bilaterally. Right MCA branch vessels are normal. A distal posterior left MCA branch vessel occlusion is noted. One additional left M3 branch vessels opacified compared to the earlier CTA. The left vertebral artery is the dominant vessel. The right PICA origin is visualized and normal. AICA vessels are noted bilaterally. The left posterior cerebral artery originates from the basilar tip. The right posterior cerebral artery originates from the basilar tip and a posterior communicating artery. The PCA branch vessels are intact. IMPRESSION: 1. Acute nonhemorrhagic posterior left MCA territory infarct is confirmed. 2. Occlusion of distal left posterior MCA branch vessel. There is opacification of an additional left MCA branch vessels since the earlier CTA study. 3. Extensive white matter disease is present in addition to the acute infarct. Electronically Signed   By: San Morelle M.D.   On: 01/31/2015 16:09   Dg Chest Port 1 View  02/02/2015  CLINICAL DATA:  Pneumonia. EXAM: PORTABLE CHEST 1 VIEW COMPARISON:  01/31/2015.  06/07/2013. FINDINGS: Mediastinum hilar structures are normal. Cardiomegaly. Right upper lobe infiltrate again noted consistent with pneumonia. No interim change. Mild increase in pulmonary interstitial markings noted in the lung bases. These changes could be related interstitial edema and/or pneumonitis. A component interstitial prominence in the lung bases is most likely related to chronic interstitial lung disease. No prominent pleural effusion or  pneumothorax. IMPRESSION: 1. Persistent right upper lobe infiltrate consistent with pneumonia. No significant interim improvement. 2. Cardiomegaly with increase in pulmonary interstitial prominence in the lung bases. Congestive heart failure cannot be excluded. Pneumonitis cannot be excluded. A component of the interstitial prominence in the lung bases is also most likely related chronic interstitial lung disease. Electronically Signed   By: Marcello Moores  Register   On: 02/02/2015 07:23   Dg Chest Port 1 View  01/31/2015  CLINICAL DATA:  Pneumonia and call EXAM: PORTABLE CHEST 1 VIEW COMPARISON:  Radiograph 01/26/2015 FINDINGS: Normal cardiac silhouette. There is airspace opacity in the RIGHT upper lobe which is increased slightly in density. Mild airspace disease in the RIGHT lower lobe appears chronic. Underlying severe emphysematous change in the RIGHT upper lobe. IMPRESSION: Slight worsening of RIGHT upper lobe pneumonia superimposed on emphysematous change. Electronically Signed   By: Suzy Bouchard M.D.   On: 01/31/2015 11:42  Dg Swallowing Func-speech Pathology  02/01/2015  Objective Swallowing Evaluation:   Patient Details Name: ABDIRAHMAN CHITTUM MRN: [id number] Date of Birth: 23-Apr-1928 Today's Date: 02/01/2015 Time: SLP Start Time (ACUTE ONLY): 0945-SLP Stop Time (ACUTE ONLY): 1010 SLP Time Calculation (min) (ACUTE ONLY): 25 min Past Medical History: Past Medical History Diagnosis Date . Muscle tremor    saw neurology remotely elsewhere, was Rx inderal . Crohn's ileocolitis ([place])  . Osteopenia    per DEXA 11/2008 . Hypertension 11/11   mild . Hemorrhoids  . Hypertrophy of prostate    w/o UR obst & oth luts . Peripheral neuropathy (HCC)    h/o neg w/u . ED (erectile dysfunction)  . Hypertriglyceridemia  . COPD (chronic obstructive pulmonary disease) ([place])    recent dx--no acute problems . Hiatal hernia 1956   found while in service . Anxiety  . Atrial fibrillation with RVR ([place])  . Long term (current) use of  anticoagulants 02/21/2011 . Serrated adenoma of colon 03/2010 . Renal cyst    bilateral . Pulmonary fibrosis ([place]) 2015   Rx Esbriet ~ 10-2013 ([name]), Dr. [name] . On home oxygen therapy    "2L; 20-24h for the past week" (01/26/2015) . Emphysema lung ([place]) 2016   Dr. [name] . B12 deficiency anemia  . Idiopathic pulmonary fibrosis ([place]) 2016   Dr. [name] . Pneumonia ?2015 . CAP (community acquired pneumonia) 01/26/2015 . Osteoarthritis    "maybe in my hands, feet" (01/26/2015) . Depression  . Squamous cell carcinoma of skin of scalp    tx'd ~ 67yr; "froze them off" . Squamous cell carcinoma, face     "froze them off" . Melanoma of scalp ([facility]  Past Surgical History: Past Surgical History Procedure Laterality Date . Colon resection  1984   resection of distal ileum and cecum w/ appendectomy, 18-inch small intestine, for Crohn's disease . Colon surgery   . Appendectomy  1984 . Cataract extraction w/ intraocular lens  implant, bilateral Bilateral 2007 . Cholecystectomy  02/17/2011   Procedure: LAPAROSCOPIC CHOLECYSTECTOMY WITH INTRAOPERATIVE CHOLANGIOGRAM;  Surgeon: BEdward Jolly MD;  Location: [facility];  Service: General;  Laterality: N/A; . Mohs surgery Right ~ 2014   "side of my scalp" HPI: 81y.o. male with pulmonary fibrosis, atrial fibrillation, Chron's, HTN, COPD, hiatal hernia and pna 01/26/15 admitted with right side weakness and inability to speak. CT negative, MRI showed acute nonhemorrhagic posterior left MCA territory infarct. CXR Slight worsening of RIGHT upper lobe pneumonia superimposed on emphysematous change. BSE 11/4 without difficulty and regular/thin recommended. Subjective: pleasant, talkative Assessment / Plan / Recommendation CHL IP CLINICAL IMPRESSIONS 02/01/2015 Therapy Diagnosis Severe pharyngeal phase dysphagia Clinical Impression Pt demonstrated severe oropharyngeal neuromuscular dysphagia characterized by sensorimotor impariment. Pt has adequate oral movement, however due reduced  sensation and muscular control results in premature spillage. Pt's pharyngeal phase is characterized by reduced hyolaryngeal elevation resulting in reduced epiglottic deflection and delayed swallow initation to the valleculae for puree and pyriforms for liquids, which results in unsensed penetration/aspiration of liquids. Pt did not sense aspiration until after it passed through his vocal cords.  Pt has significant pharyngeal residue across all consistences. Pt's global aphasia interferes with compensatory strategies and max verbal, tactile, and visual cues for second swallow to clear residue. While a chin tuck with mod-max assist reduced vallecular residue with thin liquids, SLP recommends pt remain NPO due to high risk of aspiration and risk of futher respiratory distress; pt has no functional reserve due to baseline pulmonary fibrosis and right  upper lobe pneumonia prior to stroke.  Pt will benefit from CIR for intensive dysphagia and aphasia treatment. SLP will f/u with diagnostic assessment and potential repeat of objective assessment if swallow function has improved.    [facility] IP TREATMENT RECOMMENDATION 02/01/2015 Treatment Recommendations Therapy as outlined in treatment plan below   [facility] IP DIET RECOMMENDATION 02/01/2015 SLP Diet Recommendations NPO;Alternative means - temporary Liquid Administration via -- Medication Administration Via alternative means Compensations -- Postural Changes --   [facility] IP OTHER RECOMMENDATIONS 02/01/2015 Recommended Consults -- Oral Care Recommendations Oral care QID Other Recommendations --   [facility] IP FOLLOW UP RECOMMENDATIONS 02/01/2015 Follow up Recommendations Inpatient Rehab   [facility] IP FREQUENCY AND DURATION 02/01/2015 Speech Therapy Frequency (ACUTE ONLY) min 3x week Treatment Duration --      [facility] IP ORAL PHASE 02/01/2015 Oral Phase Impaired Oral - Pudding Teaspoon -- Oral - Pudding Cup -- Oral - Honey Teaspoon -- Oral - Honey Cup -- Oral - Nectar Teaspoon -- Oral - Nectar Cup  Delayed oral transit;Premature spillage Oral - Nectar Straw Delayed oral transit;Premature spillage Oral - Thin Teaspoon -- Oral - Thin Cup Delayed oral transit;Premature spillage Oral - Thin Straw Delayed oral transit;Premature spillage Oral - Puree Delayed oral transit;Premature spillage Oral - Mech Soft -- Oral - Regular -- Oral - Multi-Consistency -- Oral - Pill -- Oral Phase - Comment --  [facility] IP PHARYNGEAL PHASE 02/01/2015 Pharyngeal Phase Nectar;Thin;Solids Pharyngeal- Pudding Teaspoon -- Pharyngeal -- Pharyngeal- Pudding Cup -- Pharyngeal -- Pharyngeal- Honey Teaspoon -- Pharyngeal -- Pharyngeal- Honey Cup -- Pharyngeal -- Pharyngeal- Nectar Teaspoon NT Pharyngeal -- Pharyngeal- Nectar Cup Delayed swallow initiation-vallecula;Delayed swallow initiation-pyriform sinuses;Reduced epiglottic inversion;Penetration/Aspiration during swallow;Penetration/Aspiration before swallow;Trace aspiration;Pharyngeal residue - valleculae;Pharyngeal residue - pyriform Pharyngeal Material enters airway, remains ABOVE vocal cords and not ejected out Pharyngeal- Nectar Straw Delayed swallow initiation-vallecula;Delayed swallow initiation-pyriform sinuses;Reduced epiglottic inversion;Penetration/Aspiration during swallow;Penetration/Apiration after swallow;Trace aspiration;Pharyngeal residue - valleculae;Pharyngeal residue - pyriform Pharyngeal Material enters airway, remains ABOVE vocal cords and not ejected out Pharyngeal- Thin Teaspoon NT Pharyngeal -- Pharyngeal- Thin Cup Delayed swallow initiation-vallecula;Delayed swallow initiation-pyriform sinuses;Reduced epiglottic inversion;Penetration/Aspiration during swallow;Penetration/Apiration after swallow;Trace aspiration;Pharyngeal residue - valleculae;Pharyngeal residue - pyriform Pharyngeal Material enters airway, passes BELOW cords without attempt by patient to eject out (silent aspiration);Material enters airway, passes BELOW cords and not ejected out despite cough attempt by  patient Pharyngeal- Thin Straw Delayed swallow initiation-vallecula;Delayed swallow initiation-pyriform sinuses;Reduced epiglottic inversion;Penetration/Aspiration during swallow;Penetration/Apiration after swallow;Trace aspiration;Pharyngeal residue - valleculae;Pharyngeal residue - pyriform Pharyngeal Material enters airway, passes BELOW cords without attempt by patient to eject out (silent aspiration);Material enters airway, passes BELOW cords and not ejected out despite cough attempt by patient Pharyngeal- Puree Delayed swallow initiation-vallecula;Reduced epiglottic inversion;Pharyngeal residue - valleculae;Pharyngeal residue - pyriform Pharyngeal -- Pharyngeal- Mechanical Soft NT Pharyngeal -- Pharyngeal- Regular NT Pharyngeal -- Pharyngeal- Multi-consistency NT Pharyngeal -- Pharyngeal- Pill NT Pharyngeal -- Pharyngeal Comment --  [facility] IP CERVICAL ESOPHAGEAL PHASE 02/01/2015 Cervical Esophageal Phase WFL Pudding Teaspoon -- Pudding Cup -- Honey Teaspoon -- Honey Cup -- Nectar Teaspoon -- Nectar Cup -- Nectar Straw -- Thin Teaspoon -- Thin Cup -- Thin Straw -- Puree -- Mechanical Soft -- Regular -- Multi-consistency -- Pill -- Cervical Esophageal Comment -- Completed by Lanier Ensign, SLP Student Supervised and reviewed by Herbie Baltimore MA CCC-SLP [place], Katherene Ponto 02/01/2015, 2:43 PM                  Medical Problem List and Plan: 1. 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  DVT Prophylaxis/Anticoagulation: Eliquis. Monitor platelet counts and any signs of bleeding 3. Pain Management: Tylenol as needed  -no pain at present 4. Mood: BuSpar 15 mg twice a day, Klonopin 0.25 mg 3 times daily as needed  -may need further medication adjustment  -continued egosupport, environmental mod 5. Neuropsych: This patient is capable of making decisions on his own behalf. 6. Skin/Wound Care: Routine skin checks 7. Fluids/Electrolytes/Nutrition: Routine I&O follow-up  chemistries  -adjust h2o flushes as indicated  -replace potassium 8. Dysphagia. Nothing by mouth. Nasogastric tube in place. Follow-up speech therapy for advancement  -pt is high aspiration/pneumonia risk given dysphagia and PF 9. Healthcare associated pneumonia as well as history of pulmonary fibrosis. Continue Levaquin. Chronic oxygen as directed  -aspiration precautions  -afebrile at present 10. Hyperlipidemia. Lopid/Pravachol 11. Atrial fibrillation:  -HR controlled at present  -eliquis--has had some epistaxis which bears observation    Post Admission Physician Evaluation: 1. Functional deficits secondary  to embolic left MCA infarct. 2. Patient is admitted to receive collaborative, interdisciplinary care between the physiatrist, rehab nursing staff, and therapy team. 3. Patient's level of medical complexity and substantial therapy needs in context of that medical necessity cannot be provided at a lesser intensity of care such as a SNF. 4. Patient has experienced substantial functional loss from his/her baseline which was documented above under the "Functional History" and "Functional Status" headings.  Judging by the patient's diagnosis, physical exam, and functional history, the patient has potential for functional progress which will result in measurable gains while on inpatient rehab.  These gains will be of substantial and practical use upon discharge  in facilitating mobility and self-care at the household level. 5. Physiatrist will provide 24 hour management of medical needs as well as oversight of the therapy plan/treatment and provide guidance as appropriate regarding the interaction of the two. 6. 24 hour rehab nursing will assist with bladder management, bowel management, safety, skin/wound care, disease management, medication administration, pain management and patient education  and help integrate therapy concepts, techniques,education, etc. 7. PT will assess and treat  for/with: Lower extremity strength, range of motion, stamina, balance, functional mobility, safety, adaptive techniques and equipment, cognitive-linguistic impact upon safety,balance; NMR; family education; ego support.   Goals are: supervision to mod I. 8. OT will assess and treat for/with: ADL's, functional mobility, safety, upper extremity strength, adaptive techniques and equipment, nmr, cognitivie linguistic needs, family ed, community reintegration.   Goals are: mod I to supervision. Therapy may proceed with showering this patient. 9. SLP will assess and treat for/with: language, swallowing, communication, family education, ego support.  Goals are: min to mod assist. 10. Case Management and Social Worker will assess and treat for psychological issues and discharge planning. 11. Team conference will be held weekly to assess progress toward goals and to determine barriers to discharge. 12. Patient will receive at least 3 hours of therapy per day at least 5 days per week. 13. ELOS: 7-12 days       14. Prognosis:  excellent     Meredith Staggers, MD, [facility] 02/02/2015

## 2015-02-02 NOTE — Progress Notes (Signed)
Speech Language Pathology Treatment: Dysphagia;Cognitive-Linquistic  Patient Details Name: Brian [name] MRN: [id number] DOB: 04-19-1928 Today's Date: 02/02/2015 Time: [id number] SLP Time Calculation (min) (ACUTE ONLY): 60 min  Assessment / Plan / Recommendation Clinical Impression  Excellent session focused on diagnostic treatment of expressive and receptive language deficits. Pt able to follow verbal commands with max contextual and visual cues, particularly with recognizable familiar words/proper nouns Laverle Patter, [name]). Also demonstrated approximately 5 instances of appropriate spontaneous verbalizations during functional tasks, otherwise perseverated on "yeah" though he attempted to inhibit this with max verbal cues. With max visual, contextual and verbal cues the pt was able to count to four with mild phonemic paraphasias. Pt is very intelligent and is very aware of context and safety even with severe language impairment, though he did demonstrate probable right visual field cut when trying to locate objects on command.  He compensates for deficits with gestures. He has become quite labile, particularly with family.   Dysphagia is persistently severe with overt signs of aspiration present during oral care tasks and with ice chips. Pt initiates swallow, but elevation is weak. Discussed findings with wife and educated her regarding aphasia and dysphagia. Continue to recommend short term alternate method of nutrition and strongly suggest inpatient rehab stay given excellent potential for functional gains in communication and swallowing.    HPI HPI: 81 y.o. male with pulmonary fibrosis, atrial fibrillation, Chron's, HTN, COPD, hiatal hernia and pna 01/26/15 admitted with right side weakness and inability to speak. CT negative, MRI showed acute nonhemorrhagic posterior left MCA territory infarct. CXR Slight worsening of RIGHT upper lobe pneumonia superimposed on emphysematous change. BSE 11/4 without  difficulty and regular/thin recommended.      SLP Plan  Continue with current plan of care     Recommendations  Diet recommendations: NPO              Oral Care Recommendations: Oral care BID Follow up Recommendations: Inpatient Rehab Plan: Continue with current plan of care   Shynia Daleo, Katherene Ponto 02/02/2015, 10:41 AM

## 2015-02-03 ENCOUNTER — Inpatient Hospital Stay (HOSPITAL_COMMUNITY): Payer: Medicare Other | Admitting: Physical Therapy

## 2015-02-03 ENCOUNTER — Inpatient Hospital Stay (HOSPITAL_COMMUNITY): Payer: Self-pay

## 2015-02-03 ENCOUNTER — Inpatient Hospital Stay (HOSPITAL_COMMUNITY): Payer: Medicare Other | Admitting: Occupational Therapy

## 2015-02-03 LAB — COMPREHENSIVE METABOLIC PANEL
ALT: 27 U/L (ref 17–63)
AST: 36 U/L (ref 15–41)
Albumin: 1.8 g/dL — ABNORMAL LOW (ref 3.5–5.0)
Alkaline Phosphatase: 98 U/L (ref 38–126)
Anion gap: 8 (ref 5–15)
BILIRUBIN TOTAL: 0.5 mg/dL (ref 0.3–1.2)
BUN: 15 mg/dL (ref 6–20)
CALCIUM: 8.4 mg/dL — AB (ref 8.9–10.3)
CO2: 28 mmol/L (ref 22–32)
CREATININE: 0.86 mg/dL (ref 0.61–1.24)
Chloride: 100 mmol/L — ABNORMAL LOW (ref 101–111)
Glucose, Bld: 129 mg/dL — ABNORMAL HIGH (ref 65–99)
Potassium: 3.5 mmol/L (ref 3.5–5.1)
Sodium: 136 mmol/L (ref 135–145)
TOTAL PROTEIN: 6.3 g/dL — AB (ref 6.5–8.1)

## 2015-02-03 LAB — CBC WITH DIFFERENTIAL/PLATELET
BASOS ABS: 0 10*3/uL (ref 0.0–0.1)
Basophils Relative: 0 %
Eosinophils Absolute: 1 10*3/uL — ABNORMAL HIGH (ref 0.0–0.7)
Eosinophils Relative: 6 %
HEMATOCRIT: 37.2 % — AB (ref 39.0–52.0)
Hemoglobin: 12.5 g/dL — ABNORMAL LOW (ref 13.0–17.0)
LYMPHS ABS: 1.1 10*3/uL (ref 0.7–4.0)
LYMPHS PCT: 7 %
MCH: 31.9 pg (ref 26.0–34.0)
MCHC: 33.6 g/dL (ref 30.0–36.0)
MCV: 94.9 fL (ref 78.0–100.0)
MONO ABS: 0.9 10*3/uL (ref 0.1–1.0)
Monocytes Relative: 6 %
NEUTROS ABS: 12.7 10*3/uL — AB (ref 1.7–7.7)
Neutrophils Relative %: 81 %
Platelets: 242 10*3/uL (ref 150–400)
RBC: 3.92 MIL/uL — AB (ref 4.22–5.81)
RDW: 12.6 % (ref 11.5–15.5)
WBC: 15.7 10*3/uL — AB (ref 4.0–10.5)

## 2015-02-03 LAB — C DIFFICILE QUICK SCREEN W PCR REFLEX
C DIFFICLE (CDIFF) ANTIGEN: NEGATIVE
C Diff interpretation: NEGATIVE
C Diff toxin: NEGATIVE

## 2015-02-03 MED ORDER — JEVITY 1.2 CAL PO LIQD
1000.0000 mL | ORAL | Status: DC
  Administered 2015-02-03: 70 mL/h
  Administered 2015-02-04 – 2015-02-11 (×9): 1000 mL
  Filled 2015-02-03 (×25): qty 1000

## 2015-02-03 MED ORDER — FREE WATER
150.0000 mL | Freq: Three times a day (TID) | Status: DC
  Administered 2015-02-03 – 2015-02-13 (×29): 150 mL

## 2015-02-03 NOTE — Progress Notes (Signed)
81 y.o. right handed male with history of pulmonary fibrosis with home oxygen 2 L with exertion, atrial fibrillation on chronic Coumadin. This was spouse one level home one-step entry independent prior to admission. Presented 01/31/2015 with aphasia and right-sided weakness with facial droop. Cranial CT scan negative. CTA of head and neck showed no large vessel occlusion. INR upon admission of 1.9. 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 Occlusion of distal left posterior MCA branch vessel. Echocardiogram with ejection fraction of 60% no wall motion abnormalities. Patient did not receive TPA. Neurology follow-up Coumadin and transitioned to Eliquis for CVA prophylaxis as well as history of atrial fibrillation. Diet is currently nothing by mouth and nasogastric tube placed with follow-up speech therapy. Maintained on broad-spectrum antibiotics for possible healthcare associated pneumonia with chest x-ray 02/02/2015 showing persistent right upper lobe infiltrate and change to Levaquin 500 milligrams daily 02/02/2015. Patient has needed oxygen 6 L with ambulation with noted history of pulmonary fibrosis oxygen saturations 82%  Subjective/Complaints:   Objective: Vital Signs: Blood pressure 124/53, pulse 85, temperature 98.1 F (36.7 C), temperature source Oral, resp. rate 18, weight 61.8 kg (136 lb 3.9 oz), SpO2 94 %. Dg Chest Port 1 View  02/02/2015  CLINICAL DATA:  Pneumonia. EXAM: PORTABLE CHEST 1 VIEW COMPARISON:  01/31/2015.  06/07/2013. FINDINGS: Mediastinum hilar structures are normal. Cardiomegaly. Right upper lobe infiltrate again noted consistent with pneumonia. No interim change. Mild increase in pulmonary interstitial markings noted in the lung bases. These changes could be related interstitial edema and/or pneumonitis. A component interstitial prominence in the lung bases is most likely related to chronic interstitial lung disease. No prominent pleural effusion or  pneumothorax. IMPRESSION: 1. Persistent right upper lobe infiltrate consistent with pneumonia. No significant interim improvement. 2. Cardiomegaly with increase in pulmonary interstitial prominence in the lung bases. Congestive heart failure cannot be excluded. Pneumonitis cannot be excluded. A component of the interstitial prominence in the lung bases is also most likely related chronic interstitial lung disease. Electronically Signed   By: Marcello Moores  Register   On: 02/02/2015 07:23   Dg Abd Portable 1v  02/02/2015  CLINICAL DATA:  NG tube placement. EXAM: PORTABLE ABDOMEN - 1 VIEW COMPARISON:  CT abdomen and pelvis 07/14/2013 FINDINGS: A weighted tip enteric tube is present and terminates in the expected region of the proximal third portion of the duodenum. Gas is present in nondilated loops of small and large bowel without evidence of obstruction, although the lower abdomen/ pelvis was not imaged. Right upper quadrant abdominal surgical clips are noted. Right upper lobe lung consolidation is more fully evaluated on chest radiograph earlier today. IMPRESSION: Feeding tube terminates in the region of the proximal third portion of the duodenum. Electronically Signed   By: Logan Bores M.D.   On: 02/02/2015 12:49   Dg Swallowing Func-speech Pathology  02/01/2015  Objective Swallowing Evaluation:   Patient Details Name: Brian [name] MRN: [id number] Date of Birth: 1929/01/08 Today's Date: 02/01/2015 Time: SLP Start Time (ACUTE ONLY): 0945-SLP Stop Time (ACUTE ONLY): 1010 SLP Time Calculation (min) (ACUTE ONLY): 25 min Past Medical History: Past Medical History Diagnosis Date . Muscle tremor    saw neurology remotely elsewhere, was Rx inderal . Crohn's ileocolitis ([place])  . Osteopenia    per DEXA 11/2008 . Hypertension 11/11   mild . Hemorrhoids  . Hypertrophy of prostate    w/o UR obst & oth luts . Peripheral neuropathy ([facility])    h/o neg w/u . ED (erectile dysfunction)  .  Hypertriglyceridemia  . COPD (chronic  obstructive pulmonary disease) ([place])    recent dx--no acute problems . Hiatal hernia 1956   found while in service . Anxiety  . Atrial fibrillation with RVR ([place])  . Long term (current) use of anticoagulants 02/21/2011 . Serrated adenoma of colon 03/2010 . Renal cyst    bilateral . Pulmonary fibrosis ([place]) 2015   Rx Esbriet ~ 10-2013 ([name]), Dr. [name] . On home oxygen therapy    "2L; 20-24h for the past week" (01/26/2015) . Emphysema lung ([place]) 2016   Dr. [name] . B12 deficiency anemia  . Idiopathic pulmonary fibrosis ([place]) 2016   Dr. [name] . Pneumonia ?2015 . CAP (community acquired pneumonia) 01/26/2015 . Osteoarthritis    "maybe in my hands, feet" (01/26/2015) . Depression  . Squamous cell carcinoma of skin of scalp    tx'd ~ 41yr; "froze them off" . Squamous cell carcinoma, face     "froze them off" . Melanoma of scalp ([facility]  Past Surgical History: Past Surgical History Procedure Laterality Date . Colon resection  1984   resection of distal ileum and cecum w/ appendectomy, 18-inch small intestine, for Crohn's disease . Colon surgery   . Appendectomy  1984 . Cataract extraction w/ intraocular lens  implant, bilateral Bilateral 2007 . Cholecystectomy  02/17/2011   Procedure: LAPAROSCOPIC CHOLECYSTECTOMY WITH INTRAOPERATIVE CHOLANGIOGRAM;  Surgeon: BEdward Jolly MD;  Location: [facility];  Service: General;  Laterality: N/A; . Mohs surgery Right ~ 2014   "side of my scalp" HPI: 81y.o. male with pulmonary fibrosis, atrial fibrillation, Chron's, HTN, COPD, hiatal hernia and pna 01/26/15 admitted with right side weakness and inability to speak. CT negative, MRI showed acute nonhemorrhagic posterior left MCA territory infarct. CXR Slight worsening of RIGHT upper lobe pneumonia superimposed on emphysematous change. BSE 11/4 without difficulty and regular/thin recommended. Subjective: pleasant, talkative Assessment / Plan / Recommendation CHL IP CLINICAL IMPRESSIONS 02/01/2015 Therapy Diagnosis Severe pharyngeal phase  dysphagia Clinical Impression Pt demonstrated severe oropharyngeal neuromuscular dysphagia characterized by sensorimotor impariment. Pt has adequate oral movement, however due reduced sensation and muscular control results in premature spillage. Pt's pharyngeal phase is characterized by reduced hyolaryngeal elevation resulting in reduced epiglottic deflection and delayed swallow initation to the valleculae for puree and pyriforms for liquids, which results in unsensed penetration/aspiration of liquids. Pt did not sense aspiration until after it passed through his vocal cords.  Pt has significant pharyngeal residue across all consistences. Pt's global aphasia interferes with compensatory strategies and max verbal, tactile, and visual cues for second swallow to clear residue. While a chin tuck with mod-max assist reduced vallecular residue with thin liquids, SLP recommends pt remain NPO due to high risk of aspiration and risk of futher respiratory distress; pt has no functional reserve due to baseline pulmonary fibrosis and right upper lobe pneumonia prior to stroke.  Pt will benefit from CIR for intensive dysphagia and aphasia treatment. SLP will f/u with diagnostic assessment and potential repeat of objective assessment if swallow function has improved.    CHL IP TREATMENT RECOMMENDATION 02/01/2015 Treatment Recommendations Therapy as outlined in treatment plan below   CHL IP DIET RECOMMENDATION 02/01/2015 SLP Diet Recommendations NPO;Alternative means - temporary Liquid Administration via -- Medication Administration Via alternative means Compensations -- Postural Changes --   CHL IP OTHER RECOMMENDATIONS 02/01/2015 Recommended Consults -- Oral Care Recommendations Oral care QID Other Recommendations --   CHL IP FOLLOW UP RECOMMENDATIONS 02/01/2015 Follow up Recommendations Inpatient Rehab   CHL IP FREQUENCY AND DURATION  02/01/2015 Speech Therapy Frequency (ACUTE ONLY) min 3x week Treatment Duration --      CHL IP  ORAL PHASE 02/01/2015 Oral Phase Impaired Oral - Pudding Teaspoon -- Oral - Pudding Cup -- Oral - Honey Teaspoon -- Oral - Honey Cup -- Oral - Nectar Teaspoon -- Oral - Nectar Cup Delayed oral transit;Premature spillage Oral - Nectar Straw Delayed oral transit;Premature spillage Oral - Thin Teaspoon -- Oral - Thin Cup Delayed oral transit;Premature spillage Oral - Thin Straw Delayed oral transit;Premature spillage Oral - Puree Delayed oral transit;Premature spillage Oral - Mech Soft -- Oral - Regular -- Oral - Multi-Consistency -- Oral - Pill -- Oral Phase - Comment --  CHL IP PHARYNGEAL PHASE 02/01/2015 Pharyngeal Phase Nectar;Thin;Solids Pharyngeal- Pudding Teaspoon -- Pharyngeal -- Pharyngeal- Pudding Cup -- Pharyngeal -- Pharyngeal- Honey Teaspoon -- Pharyngeal -- Pharyngeal- Honey Cup -- Pharyngeal -- Pharyngeal- Nectar Teaspoon NT Pharyngeal -- Pharyngeal- Nectar Cup Delayed swallow initiation-vallecula;Delayed swallow initiation-pyriform sinuses;Reduced epiglottic inversion;Penetration/Aspiration during swallow;Penetration/Aspiration before swallow;Trace aspiration;Pharyngeal residue - valleculae;Pharyngeal residue - pyriform Pharyngeal Material enters airway, remains ABOVE vocal cords and not ejected out Pharyngeal- Nectar Straw Delayed swallow initiation-vallecula;Delayed swallow initiation-pyriform sinuses;Reduced epiglottic inversion;Penetration/Aspiration during swallow;Penetration/Apiration after swallow;Trace aspiration;Pharyngeal residue - valleculae;Pharyngeal residue - pyriform Pharyngeal Material enters airway, remains ABOVE vocal cords and not ejected out Pharyngeal- Thin Teaspoon NT Pharyngeal -- Pharyngeal- Thin Cup Delayed swallow initiation-vallecula;Delayed swallow initiation-pyriform sinuses;Reduced epiglottic inversion;Penetration/Aspiration during swallow;Penetration/Apiration after swallow;Trace aspiration;Pharyngeal residue - valleculae;Pharyngeal residue - pyriform Pharyngeal Material  enters airway, passes BELOW cords without attempt by patient to eject out (silent aspiration);Material enters airway, passes BELOW cords and not ejected out despite cough attempt by patient Pharyngeal- Thin Straw Delayed swallow initiation-vallecula;Delayed swallow initiation-pyriform sinuses;Reduced epiglottic inversion;Penetration/Aspiration during swallow;Penetration/Apiration after swallow;Trace aspiration;Pharyngeal residue - valleculae;Pharyngeal residue - pyriform Pharyngeal Material enters airway, passes BELOW cords without attempt by patient to eject out (silent aspiration);Material enters airway, passes BELOW cords and not ejected out despite cough attempt by patient Pharyngeal- Puree Delayed swallow initiation-vallecula;Reduced epiglottic inversion;Pharyngeal residue - valleculae;Pharyngeal residue - pyriform Pharyngeal -- Pharyngeal- Mechanical Soft NT Pharyngeal -- Pharyngeal- Regular NT Pharyngeal -- Pharyngeal- Multi-consistency NT Pharyngeal -- Pharyngeal- Pill NT Pharyngeal -- Pharyngeal Comment --  CHL IP CERVICAL ESOPHAGEAL PHASE 02/01/2015 Cervical Esophageal Phase WFL Pudding Teaspoon -- Pudding Cup -- Honey Teaspoon -- Honey Cup -- Nectar Teaspoon -- Nectar Cup -- Nectar Straw -- Thin Teaspoon -- Thin Cup -- Thin Straw -- Puree -- Mechanical Soft -- Regular -- Multi-consistency -- Pill -- Cervical Esophageal Comment -- Completed by Lanier Ensign, SLP Student Supervised and reviewed by Herbie Baltimore MA CCC-SLP [place], Katherene Ponto 02/01/2015, 2:43 PM              Results for orders placed or performed during the hospital encounter of 02/02/15 (from the past 72 hour(s))  CBC WITH DIFFERENTIAL     Status: Abnormal   Collection Time: 02/03/15  4:26 AM  Result Value Ref Range   WBC 15.7 (H) 4.0 - 10.5 K/uL   RBC 3.92 (L) 4.22 - 5.81 MIL/uL   Hemoglobin 12.5 (L) 13.0 - 17.0 g/dL   HCT 37.2 (L) 39.0 - 52.0 %   MCV 94.9 78.0 - 100.0 fL   MCH 31.9 26.0 - 34.0 pg   MCHC 33.6 30.0 - 36.0  g/dL   RDW 12.6 11.5 - 15.5 %   Platelets 242 150 - 400 K/uL   Neutrophils Relative % 81 %   Neutro Abs 12.7 (H) 1.7 - 7.7 K/uL   Lymphocytes  Relative 7 %   Lymphs Abs 1.1 0.7 - 4.0 K/uL   Monocytes Relative 6 %   Monocytes Absolute 0.9 0.1 - 1.0 K/uL   Eosinophils Relative 6 %   Eosinophils Absolute 1.0 (H) 0.0 - 0.7 K/uL   Basophils Relative 0 %   Basophils Absolute 0.0 0.0 - 0.1 K/uL  Comprehensive metabolic panel     Status: Abnormal   Collection Time: 02/03/15  4:26 AM  Result Value Ref Range   Sodium 136 135 - 145 mmol/L   Potassium 3.5 3.5 - 5.1 mmol/L   Chloride 100 (L) 101 - 111 mmol/L   CO2 28 22 - 32 mmol/L   Glucose, Bld 129 (H) 65 - 99 mg/dL   BUN 15 6 - 20 mg/dL   Creatinine, Ser 0.86 0.61 - 1.24 mg/dL   Calcium 8.4 (L) 8.9 - 10.3 mg/dL   Total Protein 6.3 (L) 6.5 - 8.1 g/dL   Albumin 1.8 (L) 3.5 - 5.0 g/dL   AST 36 15 - 41 U/L   ALT 27 17 - 63 U/L   Alkaline Phosphatase 98 38 - 126 U/L   Total Bilirubin 0.5 0.3 - 1.2 mg/dL   GFR calc non Af Amer >60 >60 mL/min   GFR calc Af Amer >60 >60 mL/min    Comment: (NOTE) The eGFR has been calculated using the CKD EPI equation. This calculation has not been validated in all clinical situations. eGFR's persistently <60 mL/min signify possible Chronic Kidney Disease.    Anion gap 8 5 - 15     HEENT: normal Cardio: irregular and tachy Resp: CTA B/L and unlabored GI: BS positive and NT, ND Extremity:  No Edema Skin:   Intact Neuro: Abnormal Sensory cannot assess due to aphasia, Abnormal Motor 5/5 in BUE and BLE and Other receptive and exp language deficits Musc/Skel:  Normal and Other No pain ROM of UE or LE Gen NAD   Assessment/Plan: 1. Functional deficits secondary to Left MCA which require 3+ hours per day of interdisciplinary therapy in a comprehensive inpatient rehab setting. Physiatrist is providing close team supervision and 24 hour management of active medical problems listed below. Physiatrist and  rehab team continue to assess barriers to discharge/monitor patient progress toward functional and medical goals. FIM:                   Function - Comprehension Comprehension: Auditory Comprehension assist level: Understands basic 25 - 49% of the time/ requires cueing 50 - 75% of the time  Function - Expression Expression: Verbal Expression assist level: Expresses basic 25 - 49% of the time/requires cueing 50 - 75% of the time. Uses single words/gestures.  Function - Social Interaction Social Interaction assist level: Interacts appropriately 75 - 89% of the time - Needs redirection for appropriate language or to initiate interaction.  Function - Problem Solving Problem solving assist level: Solves basic 25 - 49% of the time - needs direction more than half the time to initiate, plan or complete simple activities  Function - Memory Memory assist level: Recognizes or recalls 25 - 49% of the time/requires cueing 50 - 75% of the time Patient normally able to recall (first 3 days only): Staff names and faces, That he or she is in a hospital  Medical Problem List and Plan: 1. 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  DVT Prophylaxis/Anticoagulation: Eliquis. Monitor platelet counts and any signs of bleeding 3. Pain Management: Tylenol as needed             -  no pain at present 4. Mood: BuSpar 15 mg twice a day, Klonopin 0.25 mg 3 times daily as needed             -may need further medication adjustment             -continued egosupport, environmental mod 5. Neuropsych: This patient is capable of making decisions on his own behalf. 6. Skin/Wound Care: Routine skin checks 7. Fluids/Electrolytes/Nutrition: Routine I&O follow-up chemistries             -adjust h2o flushes as indicated             -replace potassium 8. Dysphagia. Nothing by mouth. Nasogastric tube in place. Follow-up speech therapy for advancement             -pt is high aspiration/pneumonia  risk given dysphagia and PF 9. Healthcare associated pneumonia as well as history of pulmonary fibrosis. Continue Levaquin. Chronic oxygen as directed, was on 2L at rest at home and 4L with exercise             -aspiration precautions             -afebrile at present 10. Hyperlipidemia. Lopid/Pravachol 11. Atrial fibrillation:             -HR controlled at present, appropriately elevated with exercise             -eliquis--has had some epistaxis which bears observation   LOS (Days) 1 A FACE TO FACE EVALUATION WAS PERFORMED  Sakoya Win E 02/03/2015, 8:50 AM

## 2015-02-03 NOTE — Progress Notes (Signed)
Initial Nutrition Assessment  DOCUMENTATION CODES:   Non-severe (moderate) malnutrition in context of chronic illness  INTERVENTION:  Continue to advance tube feeding of Jevity 1.2 via Cortrak feeding tube by 10 ml every 4 hours to new goal rate of 85 ml/hr x 20 hours (holding 4 hours for therapy) to provide 2040 kcal (100% of needs), 94 grams of protein, and 1377 ml of free water.   Provide free water flushes of 150 ml TID per tube.   RD to continue to monitor.   NUTRITION DIAGNOSIS:   Malnutrition related to chronic illness as evidenced by moderate depletions of muscle mass, moderate depletion of body fat.  GOAL:   Patient will meet greater than or equal to 90% of their needs  MONITOR:   TF tolerance, Skin, Weight trends, Labs, I & O's  REASON FOR ASSESSMENT:   Consult Enteral/tube feeding initiation and management  ASSESSMENT:   81 y.o. right handed male with history of pulmonary fibrosis with home oxygen 2 L with exertion, atrial fibrillation on chronic Coumadin.  Presented 01/31/2015 with aphasia and right-sided weakness with facial droop. 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  Pt has Cortrak small feeding tube in place. Pt has been tolerating his tube feeds with no other difficulties per RN. During time of visit, tube feeds have just been advanced to 50 ml/hr. Discussed with RN regarding new tube feeding orders. RD to continue to monitor.   RD unable to perform physical exam during time of visit due to unavailability, however per RD note, 11/10, findings are moderate fat depletion, severe muscle depletion, and no edema.   Labs and medications reviewed.   Diet Order:  Diet NPO time specified  Skin:  Reviewed, no issues  Last BM:  11/11  Height:   Ht Readings from Last 1 Encounters:  01/26/15 5' 11.75" (1.822 m)    Weight:   Wt Readings from Last 1 Encounters:  02/03/15 136 lb 3.9 oz (61.8 kg)    Ideal Body Weight:  80  kg  BMI:  Body mass index is 18.62 kg/(m^2).  Estimated Nutritional Needs:   Kcal:  1900-2100  Protein:  90-105 grams  Fluid:  1.9 - 2.1 L/day  EDUCATION NEEDS:   No education needs identified at this time  Corrin Parker, MS, RD, LDN Pager # [phone] After hours/ weekend pager # [phone]

## 2015-02-03 NOTE — Evaluation (Signed)
Occupational Therapy Assessment and Plan  Patient Details  Name: Brian [name] MRN: [id number] Date of Birth: 15-Nov-1928  OT Diagnosis: ataxia, cognitive deficits, disturbance of vision and muscle weakness (generalized) Rehab Potential: Rehab Potential (ACUTE ONLY): Good ELOS: 10-12 days   Today's Date: 02/03/2015 OT Individual Time: 1300-1400 OT Individual Time Calculation (min): 60 min     Problem List:  Patient Active Problem List   Diagnosis Date Noted  . Left middle cerebral artery stroke ([place]) 02/02/2015  . Dysphagia, pharyngoesophageal phase 02/02/2015  . Aphasia due to recent cerebrovascular accident 02/02/2015  . Bacterial pneumonia   . Stroke with cerebral ischemia ([place])   . Stroke ([place]) 01/31/2015  . CVA (cerebral infarction) 01/31/2015  . Aphasia   . Hypokalemia   . CAP (community acquired pneumonia) 01/26/2015  . Pulmonary fibrosis ([place]) 01/26/2015  . PCP NOTES >>>>>>>>>>>> 12/14/2014  . Vitamin D deficiency 11/04/2013  . Vasomotor rhinitis 09/22/2013  . Diarrhea 07/08/2013  . Crohn's disease ([place]) 07/08/2013  . Postinflammatory pulmonary fibrosis ([place]) 05/26/2013  . Anxiety state 05/20/2013  . Sprain of shoulder, left 06/01/2012  . Personal history of colonic polyps 04/12/2012  . Pulmonary nodules 03/02/2011  . Cough 03/02/2011  . Long term (current) use of anticoagulants 02/21/2011  . Atrial fibrillation with controlled ventricular response ([place]) 02/17/2011  . Annual physical exam 12/23/2010  . Dyspnea 09/03/2010  . HEMORRHOIDS-INTERNAL 03/08/2010  . [place] INTESTINE 03/08/2010  . RECTAL BLEEDING 03/08/2010  . HEMATOCHEZIA 03/06/2010  . Essential hypertension 02/04/2010  . HYPERTROPHY PROSTATE W/O UR OBST & OTH LUTS 12/10/2009  . Osteopenia 12/07/2009  . PERIPHERAL NEUROPATHY 08/29/2009  . FECAL INCONTINENCE 01/29/2009  . OSTEOARTHRITIS, ANKLE, RIGHT 11/01/2008  . ERECTILE DYSFUNCTION 12/02/2007  . TREMOR 12/02/2007  .  CARCINOMA, SKIN, SQUAMOUS CELL 09/16/2006  . B12 DEFICIENCY 08/27/2006  . HYPERTRIGLYCERIDEMIA 08/27/2006    Past Medical History:  Past Medical History  Diagnosis Date  . Muscle tremor     saw neurology remotely elsewhere, was Rx inderal  . Crohn's ileocolitis ([place])   . Osteopenia     per DEXA 11/2008  . Hypertension 11/11    mild  . Hemorrhoids   . Hypertrophy of prostate     w/o UR obst & oth luts  . Peripheral neuropathy (HCC)     h/o neg w/u  . ED (erectile dysfunction)   . Hypertriglyceridemia   . COPD (chronic obstructive pulmonary disease) ([place])     recent dx--no acute problems  . Hiatal hernia 1956    found while in service  . Anxiety   . Atrial fibrillation with RVR ([place])   . Long term (current) use of anticoagulants 02/21/2011  . Serrated adenoma of colon 03/2010  . Renal cyst     bilateral  . Pulmonary fibrosis ([place]) 2015    Rx Esbriet ~ 10-2013 ([name]), Dr. [name]  . On home oxygen therapy     "2L; 20-24h for the past week" (01/26/2015)  . Emphysema lung ([place]) 2016    Dr. [name]  . B12 deficiency anemia   . Idiopathic pulmonary fibrosis ([place]) 2016    Dr. [name]  . Pneumonia ?2015  . CAP (community acquired pneumonia) 01/26/2015  . Osteoarthritis     "maybe in my hands, feet" (01/26/2015)  . Depression   . Squamous cell carcinoma of skin of scalp     tx'd ~ 55yr; "froze them off"  . Squamous cell carcinoma, face      "froze them off"  .  Melanoma of scalp [facility])    Past Surgical History:  Past Surgical History  Procedure Laterality Date  . Colon resection  1984    resection of distal ileum and cecum w/ appendectomy, 18-inch small intestine, for Crohn's disease  . Colon surgery    . Appendectomy  1984  . Cataract extraction w/ intraocular lens  implant, bilateral Bilateral 2007  . Cholecystectomy  02/17/2011    Procedure: LAPAROSCOPIC CHOLECYSTECTOMY WITH INTRAOPERATIVE CHOLANGIOGRAM;  Surgeon: Edward Jolly, MD;  Location: [facility];  Service:  General;  Laterality: N/A;  . Mohs surgery Right ~ 2014    "side of my scalp"    Assessment & Plan Clinical Impression: Brian [name] is a 81 y.o. right handed male with history of pulmonary fibrosis with home oxygen 2 L with exertion, atrial fibrillation on chronic Coumadin. This was spouse one level home one-step entry independent prior to admission. Presented 01/31/2015 with aphasia and right-sided weakness with facial droop. Cranial CT scan negative. CTA of head and neck showed no large vessel occlusion. INR upon admission of 1.9. 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 Occlusion of distal left posterior MCA branch vessel. Echocardiogram with ejection fraction of 60% no wall motion abnormalities. Patient did not receive TPA. Neurology follow-up Coumadin and transitioned to Eliquis for CVA prophylaxis as well as history of atrial fibrillation. Diet is currently nothing by mouth and nasogastric tube placed with follow-up speech therapy. Maintained on broad-spectrum antibiotics for possible healthcare associated pneumonia with chest x-ray 02/02/2015 showing persistent right upper lobe infiltrate and change to Levaquin 500 milligrams daily 02/02/2015. Patient has needed oxygen 6 L with ambulation with noted history of pulmonary fibrosis oxygen saturations 82%. Physical therapy evaluation completed 02/01/2015 with recommendations of physical medicine rehabilitation consult. Patient was admitted for comprehensive rehabilitation program   Patient transferred to CIR on 02/02/2015 .    Patient currently requires min with basic self-care skills secondary to muscle weakness, decreased cardiorespiratoy endurance and decreased oxygen support, ataxia, decreased visual motor skills, decreased problem solving and decreased safety awareness and decreased standing balance.  Prior to hospitalization, patient was fully independent and very active.  Patient will benefit from skilled  intervention to increase independence with basic self-care skills prior to discharge home with care partner.  Anticipate patient will require 24 hour supervision and follow up home health.  OT - End of Session Activity Tolerance: Tolerates < 10 min activity with changes in vital signs Endurance Deficit: Yes Endurance Deficit Description: cardiorespiratory endurance; increased need for supplemental oxygen OT Assessment Rehab Potential (ACUTE ONLY): Good OT Patient demonstrates impairments in the following area(s): Balance;Cognition;Endurance;Motor;Safety;Perception;Vision OT Basic ADL's Functional Problem(s): Eating;Grooming;Bathing;Dressing;Toileting OT Transfers Functional Problem(s): Toilet;Tub/Shower OT Additional Impairment(s): None OT Plan OT Intensity: Minimum of 1-2 x/day, 45 to 90 minutes OT Frequency: 5 out of 7 days OT Duration/Estimated Length of Stay: 10-12 days OT Treatment/Interventions: Balance/vestibular training;Cognitive remediation/compensation;Discharge planning;DME/adaptive equipment instruction;Functional mobility training;Neuromuscular re-education;Patient/family education;Self Care/advanced ADL retraining;Therapeutic Activities;Therapeutic Exercise;UE/LE Strength taining/ROM;Visual/perceptual remediation/compensation;UE/LE Coordination activities OT Self Feeding Anticipated Outcome(s): Supervision (if no longer NPO) OT Basic Self-Care Anticipated Outcome(s): supervision OT Toileting Anticipated Outcome(s): supervision OT Bathroom Transfers Anticipated Outcome(s): supervision OT Recommendation Patient destination: Home Follow Up Recommendations: Home health OT Equipment Recommended: Tub/shower seat   Skilled Therapeutic Intervention Pt seen this session for OT evaluation, explanation of role of OT with pt and his spouse, and ADL retraining at shower level. RN was able to stop NG tube feeds to allow pt to get in the shower.  Pt on 6 L of O2. Pt ambulated to bathroom  with HHA. In shower, pt needed max cues at times to sit down as pt wanted to stand for long periods of time. Shower completed and O2 levels checked as pt sitting on shower seat, only 82%. Sat and worked on breathing to bring levels up. As pt stood again he had incontinent episode of bowels in shower with liquid stool. Pt showered a second time. In shower both times, excellent initiation and attention. Pt bathed himself completely with cues to sit down for feet for safety. Due to aphasia, pt needs strong tactile cues to guide him.  Pt dried off, donned brief and ambulated to recliner. Rested for awhile and then donned clothing. Again good initiation, attention, [facility] with tying shoes. Only steadying A needed in standing.  RN returned to restart tube feeding and administer meds. Pt's wife providing S until next therapy session in 15 min.    OT Evaluation Precautions/Restrictions  Precautions Precautions: Fall Restrictions Weight Bearing Restrictions: No    Vital Signs Therapy Vitals Temp: 98 F (36.7 C) Temp Source: Oral Pulse Rate: (!) 106 Resp: 18 BP: (!) 105/58 mmHg Patient Position (if appropriate): Sitting Oxygen Therapy SpO2: 99 % O2 Device: Not Delivered Pain Pain Assessment Pain Assessment: No/denies pain Home Living/Prior Functioning Home Living Family/patient expects to be discharged to:: Inpatient rehab Living Arrangements: Spouse/significant other Available Help at Discharge: Family, Available 24 hours/day Type of Home: House Home Access: Level entry Home Layout: One level Bathroom Shower/Tub: Tub/shower unit, Gaffer, Door [employer] Toilet: Handicapped height Bathroom Accessibility: Yes  Lives With: Spouse Prior Function Level of Independence: Independent with gait, Independent with transfers, Independent with basic ADLs  Able to Take Stairs?: Yes Driving: Yes Vocation: Retired Comments: Pt wears 02 PRN 2L/min 02. Pt recently d/ced from hospital with diagnosis  of PNA.   Pt. still driving at time of admission.  Enjoys woodworking.   ADL ADL ADL Comments: Refer to functional navigator Vision/Perception  Vision- History Baseline Vision/History: Wears glasses Wears Glasses: At all times Patient Visual Report: No change from baseline Vision- Assessment Vision Assessment?: Vision impaired- to be further tested in functional context Additional Comments: Pt appears to have either a R visual field cut and/or R inattention. Pt tends to maintain gaze to left.  To be evaluated further. Praxis Praxis-Other Comments: Functional with self bathing and dressing.  Cognition Overall Cognitive Status: Difficult to assess (aphasia) Arousal/Alertness: Awake/alert Orientation Level: Nonverbal/unable to assess Attention: Focused;Sustained Focused Attention: Appears intact Sustained Attention: Impaired Sustained Attention Impairment: Functional basic Awareness: Impaired Awareness Impairment: Emergent impairment Problem Solving: Impaired Problem Solving Impairment: Functional basic Behaviors: Impulsive Safety/Judgment: Impaired Sensation Sensation Hot/Cold: Appears Intact (per pt response in shower) Additional Comments: Difficult to test due to cognition and communication deficits Coordination Fine Motor Movements are Fluid and Coordinated: Yes Coordination and Movement Description: tremor in R UE/LE with movement which wife states has been present for years; pt able to tie his shoes  Finger Nose Finger Test: not tested as pt not able to follow directions Motor  Motor Motor: Abnormal tone;Ataxia Motor - Skilled Clinical Observations: impulsive and restless Mobility    refer to functional navigator Trunk/Postural Assessment  Cervical Assessment Cervical Assessment: Within Functional Limits Thoracic Assessment Thoracic Assessment: Within Functional Limits Lumbar Assessment Lumbar Assessment: Within Functional Limits Postural Control Head Control:  forward head - likely premorbid Trunk Control: slight kyphosis - likely premorbid Postural Limitations: general slouched sitting posture  Balance Static Sitting Balance  Static Sitting - Level of Assistance: 5: Stand by assistance Dynamic Sitting Balance Dynamic Sitting - Level of Assistance: 5: Stand by assistance Static Standing Balance Static Standing - Level of Assistance: 4: Min assist Dynamic Standing Balance Dynamic Standing - Level of Assistance: 4: Min assist Extremity/Trunk Assessment RUE AROM (degrees) Overall AROM Right Upper Extremity: Within functional limits for tasks performed RUE Strength RUE Overall Strength Comments: grip 5/5, elbow flexion 4/5, shoulder flexion 4/5 LUE Assessment LUE Assessment: Within Functional Limits   See Function Navigator for Current Functional Status.   Refer to Care Plan for Long Term Goals  Recommendations for other services: None  Discharge Criteria: Patient will be discharged from OT if patient refuses treatment 3 consecutive times without medical reason, if treatment goals not met, if there is a change in medical status, if patient makes no progress towards goals or if patient is discharged from hospital.  The above assessment, treatment plan, treatment alternatives and goals were discussed and mutually agreed upon: by patient and by family  [place] 02/03/2015, 4:49 PM

## 2015-02-03 NOTE — Evaluation (Signed)
Speech Language Pathology Assessment and Plan  Patient Details  Name: Brian [name] MRN: [id number] Date of Birth: [birth year]  SLP Diagnosis: Aphasia;Dysphagia  Rehab Potential: Good ELOS: 7-12 days    Today's Date: 02/03/2015 SLP Individual Time: 3557-3220 SLP Individual Time Calculation (min): 59 min   Problem List:  Patient Active Problem List   Diagnosis Date Noted  . Left middle cerebral artery stroke ([place]) 02/02/2015  . Dysphagia, pharyngoesophageal phase 02/02/2015  . Aphasia due to recent cerebrovascular accident 02/02/2015  . Bacterial pneumonia   . Stroke with cerebral ischemia ([place])   . Stroke ([place]) 01/31/2015  . CVA (cerebral infarction) 01/31/2015  . Aphasia   . Hypokalemia   . CAP (community acquired pneumonia) 01/26/2015  . Pulmonary fibrosis ([place]) 01/26/2015  . PCP NOTES >>>>>>>>>>>> 12/14/2014  . Vitamin D deficiency 11/04/2013  . Vasomotor rhinitis 09/22/2013  . Diarrhea 07/08/2013  . Crohn's disease ([place]) 07/08/2013  . Postinflammatory pulmonary fibrosis ([place]) 05/26/2013  . Anxiety state 05/20/2013  . Sprain of shoulder, left 06/01/2012  . Personal history of colonic polyps 04/12/2012  . Pulmonary nodules 03/02/2011  . Cough 03/02/2011  . Long term (current) use of anticoagulants 02/21/2011  . Atrial fibrillation with controlled ventricular response ([place]) 02/17/2011  . Annual physical exam 12/23/2010  . Dyspnea 09/03/2010  . HEMORRHOIDS-INTERNAL 03/08/2010  . [place] INTESTINE 03/08/2010  . RECTAL BLEEDING 03/08/2010  . HEMATOCHEZIA 03/06/2010  . Essential hypertension 02/04/2010  . HYPERTROPHY PROSTATE W/O UR OBST & OTH LUTS 12/10/2009  . Osteopenia 12/07/2009  . PERIPHERAL NEUROPATHY 08/29/2009  . FECAL INCONTINENCE 01/29/2009  . OSTEOARTHRITIS, ANKLE, RIGHT 11/01/2008  . ERECTILE DYSFUNCTION 12/02/2007  . TREMOR 12/02/2007  . CARCINOMA, SKIN, SQUAMOUS CELL 09/16/2006  . B12 DEFICIENCY 08/27/2006  .  HYPERTRIGLYCERIDEMIA 08/27/2006   Past Medical History:  Past Medical History  Diagnosis Date  . Muscle tremor     saw neurology remotely elsewhere, was Rx inderal  . Crohn's ileocolitis ([place])   . Osteopenia     per DEXA 11/2008  . Hypertension 11/11    mild  . Hemorrhoids   . Hypertrophy of prostate     w/o UR obst & oth luts  . Peripheral neuropathy (HCC)     h/o neg w/u  . ED (erectile dysfunction)   . Hypertriglyceridemia   . COPD (chronic obstructive pulmonary disease) ([place])     recent dx--no acute problems  . Hiatal hernia 1956    found while in service  . Anxiety   . Atrial fibrillation with RVR ([place])   . Long term (current) use of anticoagulants 02/21/2011  . Serrated adenoma of colon 03/2010  . Renal cyst     bilateral  . Pulmonary fibrosis ([place]) 2015    Rx Esbriet ~ 10-2013 ([name]), Dr. [name]  . On home oxygen therapy     "2L; 20-24h for the past week" (01/26/2015)  . Emphysema lung ([place]) 2016    Dr. [name]  . B12 deficiency anemia   . Idiopathic pulmonary fibrosis ([place]) 2016    Dr. [name]  . Pneumonia ?2015  . CAP (community acquired pneumonia) 01/26/2015  . Osteoarthritis     "maybe in my hands, feet" (01/26/2015)  . Depression   . Squamous cell carcinoma of skin of scalp     tx'd ~ 58yr; "froze them off"  . Squamous cell carcinoma, face      "froze them off"  . Melanoma of scalp ([facility]    Past Surgical History:  Past  Surgical History  Procedure Laterality Date  . Colon resection  1984    resection of distal ileum and cecum w/ appendectomy, 18-inch small intestine, for Crohn's disease  . Colon surgery    . Appendectomy  1984  . Cataract extraction w/ intraocular lens  implant, bilateral Bilateral 2007  . Cholecystectomy  02/17/2011    Procedure: LAPAROSCOPIC CHOLECYSTECTOMY WITH INTRAOPERATIVE CHOLANGIOGRAM;  Surgeon: Edward Jolly, MD;  Location: [facility];  Service: General;  Laterality: N/A;  . Mohs surgery Right ~ 2014    "side of my scalp"     Assessment / Plan / Recommendation Clinical Impression Brian [name] is a 81 y.o. right handed male with history of pulmonary fibrosis with home oxygen 2 L with exertion, atrial fibrillation on chronic Coumadin. This was spouse one level home one-step entry independent prior to admission. Presented 01/31/2015 with aphasia and right-sided weakness with facial droop. Cranial CT scan negative. CTA of head and neck showed no large vessel occlusion. INR upon admission of 1.9. 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 Occlusion of distal left posterior MCA branch vessel. Echocardiogram with ejection fraction of 60% no wall motion abnormalities. Patient did not receive TPA. Neurology follow-up Coumadin and transitioned to Eliquis for CVA prophylaxis as well as history of atrial fibrillation. Diet is currently nothing by mouth and nasogastric tube placed with follow-up speech therapy. Maintained on broad-spectrum antibiotics for possible healthcare associated pneumonia with chest x-ray 02/02/2015 showing persistent right upper lobe infiltrate and change to Levaquin 500 milligrams daily 02/02/2015. Patient has needed oxygen 6 L with ambulation with noted history of pulmonary fibrosis oxygen saturations 82%. Physical therapy evaluation completed 02/01/2015 with recommendations of physical medicine rehabilitation consult.  Patient transferred to CIR on 02/02/2015.   SLP evaluations reveal continued risk for aspiration with ice chips, requiring continued NPO status and ongoing dysphagia treatment. 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 His verbal communication consists of a combination of clear (but inappropriate) words and neologisms. With Min cueing he can count from 1-15, but has difficulty with additional attempts at automatic sequences. Pt will need additional SLP f/u to maximize functional communication  and safe swallowing. Anticipate that he will need [facility] SLP and 24/7 supervision upon return home.    Skilled Therapeutic Interventions          Cognitive-linguistic and bedside-swallow evaluation completed with results and recommendations reviewed with family.     SLP Assessment  Patient will need skilled [facility] during CIR admission    Recommendations  SLP Diet Recommendations: NPO;Alternative means - temporary Medication Administration: Via alternative means Oral Care Recommendations: Oral care QID Patient destination: Home Follow up Recommendations: Home Health SLP;24 hour supervision/assistance Equipment Recommended: None recommended by SLP    SLP Frequency 3 to 5 out of 7 days   SLP Treatment/Interventions Cueing hierarchy;Dysphagia/aspiration precaution training;Environmental controls;Functional tasks;Internal/external aids;Multimodal communication approach;Patient/family education;Speech/Language facilitation;Therapeutic Activities;Therapeutic Exercise   Pain   Prior Functioning Cognitive/Linguistic Baseline: Within functional limits Type of Home: House  Lives With: Spouse Available Help at Discharge: Family;Available 24 hours/day Vocation: Retired  Function:  Eating Eating Eating activity did not occur:  (trials with SLP)               Cognition Comprehension Comprehension assist level: Understands basic 25 - 49% of the time/ requires cueing 50 - 75% of the time  Expression   Expression assist level: Expresses basic 25 - 49% of  the time/requires cueing 50 - 75% of the time. Uses single words/gestures.  Social Interaction Social Interaction assist level: Interacts appropriately 75 - 89% of the time - Needs redirection for appropriate language or to initiate interaction.  Problem Solving Problem solving assist level: Solves basic 25 - 49% of the time - needs direction more than half the time to initiate, plan or complete simple activities   Memory Memory assist level: Recognizes or recalls 25 - 49% of the time/requires cueing 50 - 75% of the time   Short Term Goals: Week 1: SLP Short Term Goal 1 (Week 1): Pt will consume therapeutic ice chip trials with minimal overt signs of aspiration across two therapy sessions to determine readiness for repeat MBS SLP Short Term Goal 2 (Week 1): Pt will utilize multimodal communication methods to communicate basic wants/needs 75% of the time with Max cues SLP Short Term Goal 3 (Week 1): Pt will follow one-step commands with 75% accuracy with Max cues within basic, familiar tasks  Refer to Care Plan for Long Term Goals  Recommendations for other services: None  Discharge Criteria: Patient will be discharged from SLP if patient refuses treatment 3 consecutive times without medical reason, if treatment goals not met, if there is a change in medical status, if patient makes no progress towards goals or if patient is discharged from hospital.  The above assessment, treatment plan, treatment alternatives and goals were discussed and mutually agreed upon: by patient and by family   Germain Osgood, M.A. CCC-SLP [phone]  Germain Osgood 02/03/2015, 3:20 PM

## 2015-02-03 NOTE — Evaluation (Signed)
Physical Therapy Assessment and Plan  Patient Details  Name: Brian [name] MRN: [id number] Date of Birth: [birth year]  PT Diagnosis: Abnormal posture, Abnormality of gait, Ataxia, Cognitive deficits, Coordination disorder, Difficulty walking, Dizziness and giddiness, Hemiplegia dominant, Hypertonia, Impaired cognition and Muscle weakness Rehab Potential: Good ELOS: 7-12 days   Today's Date: 02/03/2015 PT Individual Time:  -  800-900 Treatment Session 2: 1194-1740 Treatment Time: 60 min Treatment Session 2: 40 min      Problem List:  Patient Active Problem List   Diagnosis Date Noted  . Left middle cerebral artery stroke ([place]) 02/02/2015  . Dysphagia, pharyngoesophageal phase 02/02/2015  . Aphasia due to recent cerebrovascular accident 02/02/2015  . Bacterial pneumonia   . Stroke with cerebral ischemia ([place])   . Stroke ([place]) 01/31/2015  . CVA (cerebral infarction) 01/31/2015  . Aphasia   . Hypokalemia   . CAP (community acquired pneumonia) 01/26/2015  . Pulmonary fibrosis ([place]) 01/26/2015  . PCP NOTES >>>>>>>>>>>> 12/14/2014  . Vitamin D deficiency 11/04/2013  . Vasomotor rhinitis 09/22/2013  . Diarrhea 07/08/2013  . Crohn's disease ([place]) 07/08/2013  . Postinflammatory pulmonary fibrosis ([place]) 05/26/2013  . Anxiety state 05/20/2013  . Sprain of shoulder, left 06/01/2012  . Personal history of colonic polyps 04/12/2012  . Pulmonary nodules 03/02/2011  . Cough 03/02/2011  . Long term (current) use of anticoagulants 02/21/2011  . Atrial fibrillation with controlled ventricular response ([place]) 02/17/2011  . Annual physical exam 12/23/2010  . Dyspnea 09/03/2010  . HEMORRHOIDS-INTERNAL 03/08/2010  . [place] INTESTINE 03/08/2010  . RECTAL BLEEDING 03/08/2010  . HEMATOCHEZIA 03/06/2010  . Essential hypertension 02/04/2010  . HYPERTROPHY PROSTATE W/O UR OBST & OTH LUTS 12/10/2009  . Osteopenia 12/07/2009  . PERIPHERAL NEUROPATHY 08/29/2009  . FECAL  INCONTINENCE 01/29/2009  . OSTEOARTHRITIS, ANKLE, RIGHT 11/01/2008  . ERECTILE DYSFUNCTION 12/02/2007  . TREMOR 12/02/2007  . CARCINOMA, SKIN, SQUAMOUS CELL 09/16/2006  . B12 DEFICIENCY 08/27/2006  . HYPERTRIGLYCERIDEMIA 08/27/2006    Past Medical History:  Past Medical History  Diagnosis Date  . Muscle tremor     saw neurology remotely elsewhere, was Rx inderal  . Crohn's ileocolitis ([place])   . Osteopenia     per DEXA 11/2008  . Hypertension 11/11    mild  . Hemorrhoids   . Hypertrophy of prostate     w/o UR obst & oth luts  . Peripheral neuropathy ([facility])     h/o neg w/u  . ED (erectile dysfunction)   . Hypertriglyceridemia   . COPD (chronic obstructive pulmonary disease) ([place])     recent dx--no acute problems  . Hiatal hernia 1956    found while in service  . Anxiety   . Atrial fibrillation with RVR ([place])   . Long term (current) use of anticoagulants 02/21/2011  . Serrated adenoma of colon 03/2010  . Renal cyst     bilateral  . Pulmonary fibrosis ([place]) 2015    Rx Esbriet ~ 10-2013 ([name]), Dr. [name]  . On home oxygen therapy     "2L; 20-24h for the past week" (01/26/2015)  . Emphysema lung ([place]) 2016    Dr. [name]  . B12 deficiency anemia   . Idiopathic pulmonary fibrosis ([place]) 2016    Dr. [name]  . Pneumonia ?2015  . CAP (community acquired pneumonia) 01/26/2015  . Osteoarthritis     "maybe in my hands, feet" (01/26/2015)  . Depression   . Squamous cell carcinoma of skin of scalp     tx'd ~  56yrs; "froze them off"  . Squamous cell carcinoma, face      "froze them off"  . Melanoma of scalp [facility])    Past Surgical History:  Past Surgical History  Procedure Laterality Date  . Colon resection  1984    resection of distal ileum and cecum w/ appendectomy, 18-inch small intestine, for Crohn's disease  . Colon surgery    . Appendectomy  1984  . Cataract extraction w/ intraocular lens  implant, bilateral Bilateral 2007  . Cholecystectomy  02/17/2011    Procedure:  LAPAROSCOPIC CHOLECYSTECTOMY WITH INTRAOPERATIVE CHOLANGIOGRAM;  Surgeon: Edward Jolly, MD;  Location: [facility];  Service: General;  Laterality: N/A;  . Mohs surgery Right ~ 2014    "side of my scalp"    Assessment & Plan Clinical Impression: CHRIST FULLENWIDER is a 81 y.o. right handed male with history of pulmonary fibrosis with home oxygen 2 L with exertion, atrial fibrillation on chronic Coumadin. This was spouse one level home one-step entry independent prior to admission. Presented 01/31/2015 with aphasia and right-sided weakness with facial droop. Cranial CT scan negative. CTA of head and neck showed no large vessel occlusion. INR upon admission of 1.9. 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 Occlusion of distal left posterior MCA branch vessel. Echocardiogram with ejection fraction of 60% no wall motion abnormalities. Patient did not receive TPA. Neurology follow-up Coumadin and transitioned to Eliquis for CVA prophylaxis as well as history of atrial fibrillation. Diet is currently nothing by mouth and nasogastric tube placed with follow-up speech therapy. Maintained on broad-spectrum antibiotics for possible healthcare associated pneumonia with chest x-ray 02/02/2015 showing persistent right upper lobe infiltrate and change to Levaquin 500 milligrams daily 02/02/2015. Patient has needed oxygen 6 L with ambulation with noted history of pulmonary fibrosis oxygen saturations 82%. Physical therapy evaluation completed 02/01/2015 with recommendations of physical medicine rehabilitation consult.  Patient transferred to CIR on 02/02/2015 .   Patient currently requires min with mobility secondary to muscle weakness, decreased cardiorespiratoy endurance and decreased oxygen support, abnormal tone, ataxia and decreased coordination, decreased attention, decreased awareness, decreased problem solving and decreased safety awareness and decreased sitting balance, decreased standing  balance, decreased postural control and hemiplegia.  Prior to hospitalization, patient was independent  with mobility and lived with Spouse in a House home.  Home access is  Level entry.  Patient will benefit from skilled PT intervention to maximize safe functional mobility, minimize fall risk and decrease caregiver burden for planned discharge home with 24 hour assist.  Anticipate patient will benefit from follow up [facility] at discharge.  PT - End of Session Activity Tolerance: Tolerates < 10 min activity with changes in vital signs Endurance Deficit: Yes Endurance Deficit Description: cardiorespiratory endurance; increased need for supplemental oxygen PT Assessment Rehab Potential (ACUTE/IP ONLY): Good PT Patient demonstrates impairments in the following area(s): Balance;Behavior;Endurance;Motor;Safety PT Transfers Functional Problem(s): Bed Mobility;Bed to Chair;Car;Furniture PT Locomotion Functional Problem(s): Ambulation;Wheelchair Mobility;Stairs PT Plan PT Intensity: Minimum of 1-2 x/day ,45 to 90 minutes PT Frequency: 5 out of 7 days PT Duration Estimated Length of Stay: 7-12 days PT Treatment/Interventions: Ambulation/gait training;Discharge planning;Functional mobility training;Psychosocial support;Therapeutic Activities;Balance/vestibular training;Disease management/prevention;Neuromuscular re-education;Therapeutic Exercise;Wheelchair propulsion/positioning;Cognitive remediation/compensation;DME/adaptive equipment instruction;Pain management;UE/LE Strength taining/ROM;Community reintegration;Patient/family education;Stair training;UE/LE Coordination activities PT Transfers Anticipated Outcome(s): Supervision PT Locomotion Anticipated Outcome(s): Supervision PT Recommendation Follow Up Recommendations: Home health PT (pending progress) Patient destination: Home (pending progress) Equipment Recommended: To be determined  Skilled Therapeutic Intervention Treatment Session 1: Pt received  in bed  with wife and son present who provide PT with information and assistance throughout PT session. PT Evaluation - Pt presents with significant aphasia and swallowing deficits req an NG tube. Pt has minimal R side hemiplegia - intention tremor/ataxia noted on R arm/leg. Pt also demonstrates impulsive movement and some restlessness, but very willing to work hard. Pt req 4 L O2/min at rest and desaturates with ambulation, req 6 L O2/min and prolonged amount of time sitting with demonstration for pursed lip breathing. Gait Training - see function tab for details. Therapeutic Activity - see function tab for details. Pt ended up in w/c with quick release belt in place and all needs in reach, family present. PT asked nurse tech to obtain a pancake call light due to cognitive deficits.   Treatment Session 2: Pt received up in recliner with family present and grandson playing steel drum - pt very relaxed & sleepy, but easy to arouse and very willing to participate in PT. W/C Management - PT instructs pt in w/c propulsion with B UEs using tactile and visual cues, as well as verbal cues, with hand over hand placement to teach pt propelling in straight line and with turns - 75' x 2 reps. SpO2 maintains >= 94% on 6 L O2/min after this activity. Therapeutic Activity: PT instructs pt in car transfer stand-step with armrests, but no AD due to portable oxygen and IV pole with NG tube feeding going (no room for RW) req min A, after demonstration. TranSit set at sedan height, as wife reports they have a [employer] and a Printmaker. SpO2 maintains >= 98% on 6 L O2/min after this activity. Gait Training - PT instructs pt in ascending/descending 3 stairs with B rails req CGA for safety in step-to manner - pt req assist with placing R arm on stairrail, possible indicating R side inattention/neglect to the R environment. SpO2 maintains >= 92% on 6 L O2/min after this activity. Pt ended up in recliner with family present, agreeing to call  the nurse and make sure he has a quick release belt in place and call light in reach if/when they leave. Continue per PT POC.    PT Evaluation Precautions/Restrictions Restrictions Weight Bearing Restrictions: No General   Vital Signs: See flow sheet Pain Pain Assessment Pain Assessment: No/denies pain Pain Score: 0-No pain  Treatment Session 2: Pt denies pain.  Home Living/Prior Functioning  See flowsheet for details. Pt has 1 STE in front, but level entry through garage - home is one level ranch style. Pt has both walk in shower with door and tub-shower combo. Pt has both high and standard height toilet at home. Pt can fit a small w/c or a walker into bathroom. PLOF = Independent.  Vision/Perception    Pt wears glasses at all times and unable to determine if there are visual deficits due to cognitive and communication deficits.  Cognition Impaired safety awareness, all aspects of awareness, problem solving. Pt disoriented to all concepts except person. Cognition is impaired from baseline.  Sensation Sensation Additional Comments: Difficult to test due to cognition and communication deficits Coordination Gross Motor Movements are Fluid and Coordinated: No Fine Motor Movements are Fluid and Coordinated: Not tested Coordination and Movement Description: tremor in R UE/LE with movement Motor  Motor Motor: Abnormal tone;Ataxia Motor - Skilled Clinical Observations: impulsive and restless  Mobility Bed Mobility Bed Mobility: Rolling Right;Rolling Left;Right Sidelying to Sit Rolling Right: 5: Supervision Rolling Right Details: Verbal cues for precautions/safety Rolling Left: 5: Supervision  Rolling Left Details: Verbal cues for precautions/safety Right Sidelying to Sit: 5: Supervision Right Sidelying to Sit Details: Verbal cues for precautions/safety Transfers Transfers: Yes Sit to Stand: 4: Min guard;With upper extremity assist;From bed Stand Pivot Transfers: 4: Min  guard Locomotion  Ambulation Ambulation: Yes Ambulation/Gait Assistance: 1: +2 Total assist;4: Min guard Ambulation Distance (Feet): 45 Feet Assistive device: Rolling walker Ambulation/Gait Assistance Details: Verbal cues for safe use of DME/AE;Manual facilitation for weight shifting;Manual facilitation for placement;Verbal cues for precautions/safety Ambulation/Gait Assistance Details: +3 assist due to rolling tube feeds on IV pole, w/c follow, and portable oxygen, as well as CGA from PT for balance Gait Gait: Yes Gait Pattern: Impaired Gait Pattern: Step-through pattern;Trunk flexed  Trunk/Postural Assessment  Cervical Assessment Cervical Assessment: Within Functional Limits Thoracic Assessment Thoracic Assessment: Within Functional Limits Lumbar Assessment Lumbar Assessment: Within Functional Limits Postural Control Postural Control: Deficits on evaluation Head Control: forward head - likely premorbid Trunk Control: slight kyphosis - likely premorbid Postural Limitations: general slouched sitting posture  Balance Balance Balance Assessed: Yes Static Sitting Balance Static Sitting - Balance Support: Bilateral upper extremity supported;Feet supported Static Sitting - Level of Assistance: 5: Stand by assistance Dynamic Sitting Balance Dynamic Sitting - Balance Support: Bilateral upper extremity supported;Feet supported;During functional activity Dynamic Sitting - Level of Assistance: 5: Stand by assistance Static Standing Balance Static Standing - Balance Support: Bilateral upper extremity supported;During functional activity Static Standing - Level of Assistance: 4: Min assist Dynamic Standing Balance Dynamic Standing - Balance Support: Bilateral upper extremity supported;During functional activity Dynamic Standing - Level of Assistance: 4: Min assist Extremity Assessment   See flow sheet for details - All ROM WFL in all 4 extremities, but minimal R side weakness compared to  L side.          See Function Navigator for Current Functional Status.   Refer to Care Plan for Long Term Goals  Recommendations for other services: None  Discharge Criteria: Patient will be discharged from PT if patient refuses treatment 3 consecutive times without medical reason, if treatment goals not met, if there is a change in medical status, if patient makes no progress towards goals or if patient is discharged from hospital.  The above assessment, treatment plan, treatment alternatives and goals were discussed and mutually agreed upon: by patient and by family  [facility] M 02/03/2015, 12:43 PM

## 2015-02-04 ENCOUNTER — Inpatient Hospital Stay (HOSPITAL_COMMUNITY): Payer: Medicare Other | Admitting: Physical Therapy

## 2015-02-04 DIAGNOSIS — D72829 Elevated white blood cell count, unspecified: Secondary | ICD-10-CM

## 2015-02-04 MED ORDER — DIPHENOXYLATE-ATROPINE 2.5-0.025 MG/5ML PO LIQD
5.0000 mL | Freq: Four times a day (QID) | ORAL | Status: DC | PRN
  Administered 2015-02-04 – 2015-02-05 (×2): 5 mL
  Filled 2015-02-04 (×2): qty 5

## 2015-02-04 NOTE — IPOC Note (Addendum)
Overall Plan of Care [facility]) Patient Details Name: Brian [name] MRN: [id number] DOB: 10/10/1928  Admitting Diagnosis: L CVA  Hospital Problems: Principal Problem:   Left middle cerebral artery stroke [facility]) Active Problems:   Dysphagia, pharyngoesophageal phase   Aphasia due to recent cerebrovascular accident   Bacterial pneumonia     Functional Problem List: Nursing Behavior, Bladder, Bowel, Nutrition, Perception, Medication Management, Endurance, Motor, Sensory, Safety, Skin Integrity  PT Balance, Behavior, Endurance, Motor, Safety  OT Balance, Cognition, Endurance, Motor, Safety, Perception, Vision  SLP Linguistic  TR         Basic ADL's: OT Eating, Grooming, Bathing, Dressing, Toileting     Advanced  ADL's: OT       Transfers: PT Bed Mobility, Bed to Chair, Car, Manufacturing systems engineer, Metallurgist: PT Ambulation, Emergency planning/management officer, Stairs     Additional Impairments: OT None  SLP Communication, Swallowing comprehension, expression    TR      Anticipated Outcomes Item Anticipated Outcome  Self Feeding Supervision (if no longer NPO)  Swallowing  Mod A   Basic self-care  supervision  Toileting  supervision   Bathroom Transfers supervision  Bowel/Bladder  Pt will manage bowel and bladder with Min assist   Transfers  Supervision  Locomotion  Supervision  Communication  Max A  Cognition     Pain  Pt will rate pain at 4 or less on a scale of 0-10  Safety/Judgment  Pt will remain free of falls and injury while on rehab   Therapy Plan: PT Intensity: Minimum of 1-2 x/day ,45 to 90 minutes PT Frequency: 5 out of 7 days PT Duration Estimated Length of Stay: 7-12 days OT Intensity: Minimum of 1-2 x/day, 45 to 90 minutes OT Frequency: 5 out of 7 days OT Duration/Estimated Length of Stay: 10-12 days SLP Intensity: Minumum of 1-2 x/day, 30 to 90 minutes SLP Frequency: 3 to 5 out of 7 days SLP Duration/Estimated Length of Stay: 7-12 days        Team Interventions: Nursing Interventions Patient/Family Education, Bladder Management, Bowel Management, Disease Management/Prevention, Skin Care/Wound Management, Medication Management, Cognitive Remediation/Compensation, Dysphagia/Aspiration Precaution Training, Pain Management, Discharge Planning, Psychosocial Support  PT interventions Ambulation/gait training, Discharge planning, Functional mobility training, Psychosocial support, Therapeutic Activities, Balance/vestibular training, Disease management/prevention, Neuromuscular re-education, Therapeutic Exercise, Wheelchair propulsion/positioning, Cognitive remediation/compensation, DME/adaptive equipment instruction, Pain management, UE/LE Strength taining/ROM, Community reintegration, Barrister's clerk education, IT trainer, UE/LE Coordination activities  OT Interventions Training and development officer, Cognitive remediation/compensation, Discharge planning, DME/adaptive equipment instruction, Functional mobility training, Neuromuscular re-education, Patient/family education, Self Care/advanced ADL retraining, Therapeutic Activities, Therapeutic Exercise, UE/LE Strength taining/ROM, Visual/perceptual remediation/compensation, UE/LE Coordination activities  SLP Interventions Cueing hierarchy, Dysphagia/aspiration precaution training, Environmental controls, Functional tasks, Internal/external aids, Multimodal communication approach, Patient/family education, Speech/Language facilitation, Therapeutic Activities, Therapeutic Exercise  TR Interventions    SW/CM Interventions  discharge planning, psychosocial, family education    Team Discharge Planning: Destination: PT-Home (pending progress) ,OT- Home , SLP-Home Projected Follow-up: PT-Home health PT (pending progress), OT-  Home health OT, SLP-Home Health SLP, 24 hour supervision/assistance Projected Equipment Needs: PT-To be determined, OT- Tub/shower seat, SLP-None recommended by  SLP Equipment Details: PT- , OT-  Patient/family involved in discharge planning: PT- Patient, Family member/caregiver,  OT-Patient, Family member/caregiver, SLP-Patient, Family member/caregiver  MD ELOS: 10-14d Medical Rehab Prognosis:  Good Assessment: 81 y.o. right handed male with history of pulmonary fibrosis with home oxygen 2 L with exertion, atrial fibrillation on chronic Coumadin. This was spouse one level  home one-step entry independent prior to admission. Presented 01/31/2015 with aphasia and right-sided weakness with facial droop. Cranial CT scan negative. CTA of head and neck showed no large vessel occlusion. INR upon admission of 1.9. MRI showed acute nonhemorrhagic posterior left MCA territory infarct   Now requiring 24/7 Rehab RN,MD, as well as CIR level PT, OT and SLP.  Treatment team will focus on ADLs and mobility as well as communication with goals set at sup  See Team Conference Notes for weekly updates to the plan of care

## 2015-02-04 NOTE — Progress Notes (Signed)
Physical Therapy Session Note  Patient Details  Name: Brian [name] MRN: [id number] Date of Birth: 1929-01-20  Today's Date: 02/04/2015 PT Individual Time: 1100-1200 PT Individual Time Calculation (min): 60 min   Short Term Goals: Week 1:  PT Short Term Goal 1 (Week 1): STGs = LTGs due to ELOS  Skilled Therapeutic Interventions/Progress Updates:  Pt was seen bedside in the am with wife at bedside. Pt on 3 liters nasal canula. Pt transferred sit to stand with min A and verbal cues. Pt requested to utilize the bathroom. Pt ambulated about 15 feet to bathroom with min A without assistive device and second person to assist with feeding tube and O2. Pt performed toilet transfers with min A and grab bar. Pt dependent for hygiene. Pt ambulated from toilet to w/c about 15 feet with min A and second person to assist with lines and O2. O2 sat after bathroom on 3 liters was 79% with HR 120 to 130s. O2 increased to 4 liters, O2 sat greater than 92% in about 2 minutes with HR down to the 100s. Pt's nurse notified O2 sat with activity and increased O2 to 4 liters. Pt propelled w/c about 200 feet with B LEs and S with assist for feeding tube and O2 tank. Pt ambulated 150 feet without assistive device and min A with 2nd person to assist with feeding tube and O2 tank. O2 saturation dropped to 83% and HR increased to 130s with ambulation. O2 sat returned to greater than 92% and HR in the 100s after about 120 seconds. Pt was able to pick an object up of the floor with mod A and verbal cues. Pt ambulated about 10 feet on uneven surface with mod A and verbal cues, second person to assist with lines and O2. Following treatment pt returned to room. Pt transferred w/c to recliner with min A and verbal cues. Pt left sitting up in recliner with wife at bedside.   Therapy Documentation Precautions:  Precautions Precautions: Fall Restrictions Weight Bearing Restrictions: No General:   Pain: No c/o pain.   See Function  Navigator for Current Functional Status.   Therapy/Group: Individual Therapy  Dub Amis 02/04/2015, 12:58 PM

## 2015-02-04 NOTE — Progress Notes (Addendum)
Subjective/Complaints: Remain aphasic  ROS cannot obtain due to aphasia  Objective: Vital Signs: Blood pressure 123/60, pulse 91, temperature 98.6 F (37 C), temperature source Oral, resp. rate 18, weight 61.19 kg (134 lb 14.4 oz), SpO2 92 %. Dg Abd Portable 1v  02/02/2015  CLINICAL DATA:  NG tube placement. EXAM: PORTABLE ABDOMEN - 1 VIEW COMPARISON:  CT abdomen and pelvis 07/14/2013 FINDINGS: A weighted tip enteric tube is present and terminates in the expected region of the proximal third portion of the duodenum. Gas is present in nondilated loops of small and large bowel without evidence of obstruction, although the lower abdomen/ pelvis was not imaged. Right upper quadrant abdominal surgical clips are noted. Right upper lobe lung consolidation is more fully evaluated on chest radiograph earlier today. IMPRESSION: Feeding tube terminates in the region of the proximal third portion of the duodenum. Electronically Signed   By: Logan Bores M.D.   On: 02/02/2015 12:49   Results for orders placed or performed during the hospital encounter of 02/02/15 (from the past 72 hour(s))  CBC WITH DIFFERENTIAL     Status: Abnormal   Collection Time: 02/03/15  4:26 AM  Result Value Ref Range   WBC 15.7 (H) 4.0 - 10.5 K/uL   RBC 3.92 (L) 4.22 - 5.81 MIL/uL   Hemoglobin 12.5 (L) 13.0 - 17.0 g/dL   HCT 37.2 (L) 39.0 - 52.0 %   MCV 94.9 78.0 - 100.0 fL   MCH 31.9 26.0 - 34.0 pg   MCHC 33.6 30.0 - 36.0 g/dL   RDW 12.6 11.5 - 15.5 %   Platelets 242 150 - 400 K/uL   Neutrophils Relative % 81 %   Neutro Abs 12.7 (H) 1.7 - 7.7 K/uL   Lymphocytes Relative 7 %   Lymphs Abs 1.1 0.7 - 4.0 K/uL   Monocytes Relative 6 %   Monocytes Absolute 0.9 0.1 - 1.0 K/uL   Eosinophils Relative 6 %   Eosinophils Absolute 1.0 (H) 0.0 - 0.7 K/uL   Basophils Relative 0 %   Basophils Absolute 0.0 0.0 - 0.1 K/uL  Comprehensive metabolic panel     Status: Abnormal   Collection Time: 02/03/15  4:26 AM  Result Value Ref  Range   Sodium 136 135 - 145 mmol/L   Potassium 3.5 3.5 - 5.1 mmol/L   Chloride 100 (L) 101 - 111 mmol/L   CO2 28 22 - 32 mmol/L   Glucose, Bld 129 (H) 65 - 99 mg/dL   BUN 15 6 - 20 mg/dL   Creatinine, Ser 0.86 0.61 - 1.24 mg/dL   Calcium 8.4 (L) 8.9 - 10.3 mg/dL   Total Protein 6.3 (L) 6.5 - 8.1 g/dL   Albumin 1.8 (L) 3.5 - 5.0 g/dL   AST 36 15 - 41 U/L   ALT 27 17 - 63 U/L   Alkaline Phosphatase 98 38 - 126 U/L   Total Bilirubin 0.5 0.3 - 1.2 mg/dL   GFR calc non Af Amer >60 >60 mL/min   GFR calc Af Amer >60 >60 mL/min    Comment: (NOTE) The eGFR has been calculated using the CKD EPI equation. This calculation has not been validated in all clinical situations. eGFR's persistently <60 mL/min signify possible Chronic Kidney Disease.    Anion gap 8 5 - 15  C difficile quick scan w PCR reflex     Status: None   Collection Time: 02/03/15 12:11 PM  Result Value Ref Range   C Diff antigen NEGATIVE NEGATIVE  C Diff toxin NEGATIVE NEGATIVE   C Diff interpretation Negative for toxigenic C. difficile      HEENT: normal Cardio: irregular and tachy Resp: CTA B/L and unlabored GI: BS positive and NT, ND Extremity:  No Edema Skin:   Intact Neuro: Abnormal Sensory cannot assess due to aphasia, Abnormal Motor 5/5 in BUE and BLE and Other receptive and exp language deficits Musc/Skel:  Normal and Other No pain ROM of UE or LE Gen NAD   Assessment/Plan: 1. Functional deficits secondary to Left MCA which require 3+ hours per day of interdisciplinary therapy in a comprehensive inpatient rehab setting. Physiatrist is providing close team supervision and 24 hour management of active medical problems listed below. Physiatrist and rehab team continue to assess barriers to discharge/monitor patient progress toward functional and medical goals. FIM: Function - Bathing Position: Shower Body parts bathed by patient: Right arm, Left arm, Chest, Abdomen, Front perineal area, Buttocks, Right  upper leg, Back, Left lower leg, Right lower leg, Left upper leg  Function- Upper Body Dressing/Undressing What is the patient wearing?: Pull over shirt/dress Pull over shirt/dress - Perfomed by patient: Thread/unthread right sleeve, Thread/unthread left sleeve, Pull shirt over trunk Pull over shirt/dress - Perfomed by helper: Put head through opening Function - Lower Body Dressing/Undressing What is the patient wearing?: Pants, Socks, Shoes Position: Wheelchair/chair at sink Pants- Performed by patient: Thread/unthread right pants leg, Thread/unthread left pants leg, Pull pants up/down Socks - Performed by patient: Don/doff right sock, Don/doff left sock Shoes - Performed by patient: Don/doff right shoe, Don/doff left shoe, Fasten right, Fasten left Assist for lower body dressing: Touching or steadying assistance (Pt > 75%)     Function - Air cabin crew transfer activity did not occur: N/A  Function - Chair/bed transfer Chair/bed transfer method: Stand pivot Chair/bed transfer assist level: Touching or steadying assistance (Pt > 75%) Chair/bed transfer assistive device: Other (HHA) Chair/bed transfer details: Manual facilitation for placement, Verbal cues for sequencing, Verbal cues for technique, Verbal cues for precautions/safety  Function - Locomotion: Wheelchair Will patient use wheelchair at discharge?: Yes Type: Manual Max wheelchair distance: 75' x 2 reps Assist Level: Touching or steadying assistance (Pt > 75%) Assist Level: Touching or steadying assistance (Pt > 75%) Wheel 150 feet activity did not occur: Safety/medical concerns Turns around,maneuvers to table,bed, and toilet,negotiates 3% grade,maneuvers on rugs and over doorsills: No Function - Locomotion: Ambulation Assistive device: Walker-rolling Max distance: 63' Assist level: 2 helpers Assist level: 2 helpers Walk 50 feet with 2 turns activity did not occur: Safety/medical concerns Walk 150 feet  activity did not occur: Safety/medical concerns Walk 10 feet on uneven surfaces activity did not occur: Safety/medical concerns  Function - Comprehension Comprehension: Auditory Comprehension assist level: Understands basic 25 - 49% of the time/ requires cueing 50 - 75% of the time  Function - Expression Expression: Verbal Expression assist level: Expresses basic 25 - 49% of the time/requires cueing 50 - 75% of the time. Uses single words/gestures.  Function - Social Interaction Social Interaction assist level: Interacts appropriately 75 - 89% of the time - Needs redirection for appropriate language or to initiate interaction.  Function - Problem Solving Problem solving assist level: Solves basic 25 - 49% of the time - needs direction more than half the time to initiate, plan or complete simple activities  Function - Memory Memory assist level: Recognizes or recalls 25 - 49% of the time/requires cueing 50 - 75% of the time Patient normally able to recall (first  3 days only): None of the above  Medical Problem List and Plan: 1. 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  DVT Prophylaxis/Anticoagulation: Eliquis. Monitor platelet counts and any signs of bleeding 3. Pain Management: Tylenol as needed             -no pain at present 4. Mood: BuSpar 15 mg twice a day, Klonopin 0.25 mg 3 times daily as needed             -may need further medication adjustment             -continued egosupport, environmental mod 5. Neuropsych: This patient is capable of making decisions on his own behalf. 6. Skin/Wound Care: Routine skin checks 7. Fluids/Electrolytes/Nutrition: Routine I&O follow-up chemistries             -adjust h2o flushes as indicated             -replace potassium 8. Dysphagia. Nothing by mouth. Nasogastric tube in place. Follow-up speech therapy for advancement             -pt is high aspiration/pneumonia risk given dysphagia and PF 9. Healthcare associated  pneumonia as well as history of pulmonary fibrosis. Continue Levaquin. Chronic oxygen as directed, was on 2L at rest at home and 4L with exercise             -aspiration precautions             -afebrile at present 10. Hyperlipidemia. Lopid/Pravachol 11. Atrial fibrillation:             -HR controlled at present, appropriately elevated with exercise, HR 91 monitor             -eliquis--has had some epistaxis no rucurrent episode 12.  Diarrhea, stool for C diff is neg, has Crohn's, uunable to give pentasa CR due to NGT, consider rectal supp 13.  Reviewed labs personally, elevated WBC likely residual from HCAP, monitor for fever and repeat in am LOS (Days) 2 A FACE TO FACE EVALUATION WAS PERFORMED  Langston Summerfield E 02/04/2015, 8:11 AM

## 2015-02-05 ENCOUNTER — Inpatient Hospital Stay (HOSPITAL_COMMUNITY): Payer: Medicare Other | Admitting: Speech Pathology

## 2015-02-05 ENCOUNTER — Inpatient Hospital Stay (HOSPITAL_COMMUNITY): Payer: Self-pay | Admitting: Physical Therapy

## 2015-02-05 ENCOUNTER — Inpatient Hospital Stay (HOSPITAL_COMMUNITY): Payer: Medicare Other | Admitting: Occupational Therapy

## 2015-02-05 DIAGNOSIS — R197 Diarrhea, unspecified: Secondary | ICD-10-CM

## 2015-02-05 LAB — CULTURE, BLOOD (ROUTINE X 2)
Culture: NO GROWTH
Culture: NO GROWTH

## 2015-02-05 MED ORDER — DIPHENOXYLATE-ATROPINE 2.5-0.025 MG/5ML PO LIQD
5.0000 mL | Freq: Four times a day (QID) | ORAL | Status: DC
  Administered 2015-02-05 – 2015-02-07 (×10): 5 mL
  Filled 2015-02-05 (×11): qty 5

## 2015-02-05 MED ORDER — CETYLPYRIDINIUM CHLORIDE 0.05 % MT LIQD
7.0000 mL | Freq: Two times a day (BID) | OROMUCOSAL | Status: DC
  Administered 2015-02-05 – 2015-02-15 (×19): 7 mL via OROMUCOSAL

## 2015-02-05 MED ORDER — CHLORHEXIDINE GLUCONATE 0.12 % MT SOLN
15.0000 mL | Freq: Two times a day (BID) | OROMUCOSAL | Status: DC
  Administered 2015-02-05 – 2015-02-16 (×21): 15 mL via OROMUCOSAL
  Filled 2015-02-05 (×16): qty 15

## 2015-02-05 NOTE — Progress Notes (Signed)
Patient information reviewed and entered into eRehab system by Chelsy Parrales, RN, CRRN, PPS Coordinator.  Information including medical coding and functional independence measure will be reviewed and updated through discharge.     Per nursing patient was given "Data Collection Information Summary for Patients in Inpatient Rehabilitation Facilities with attached "Privacy Act Statement-Health Care Records" upon admission.  

## 2015-02-05 NOTE — Progress Notes (Signed)
Social Work  Social Work Assessment and Plan  Patient Details  Name: Brian [name] MRN: [id number] Date of Birth: 1928-05-03  Today's Date: 02/05/2015  Problem List:  Patient Active Problem List   Diagnosis Date Noted  . Left middle cerebral artery stroke ([name]) 02/02/2015  . Dysphagia, pharyngoesophageal phase 02/02/2015  . Aphasia due to recent cerebrovascular accident 02/02/2015  . Bacterial pneumonia   . Stroke with cerebral ischemia ([name])   . Stroke ([name]) 01/31/2015  . CVA (cerebral infarction) 01/31/2015  . Aphasia   . Hypokalemia   . CAP (community acquired pneumonia) 01/26/2015  . Pulmonary fibrosis ([place]) 01/26/2015  . PCP NOTES >>>>>>>>>>>> 12/14/2014  . Vitamin D deficiency 11/04/2013  . Vasomotor rhinitis 09/22/2013  . Diarrhea 07/08/2013  . Crohn's disease ([name]) 07/08/2013  . Postinflammatory pulmonary fibrosis ([name]) 05/26/2013  . Anxiety state 05/20/2013  . Sprain of shoulder, left 06/01/2012  . Personal history of colonic polyps 04/12/2012  . Pulmonary nodules 03/02/2011  . Cough 03/02/2011  . Long term (current) use of anticoagulants 02/21/2011  . Atrial fibrillation with controlled ventricular response ([name]) 02/17/2011  . Annual physical exam 12/23/2010  . Dyspnea 09/03/2010  . HEMORRHOIDS-INTERNAL 03/08/2010  . [place] INTESTINE 03/08/2010  . RECTAL BLEEDING 03/08/2010  . HEMATOCHEZIA 03/06/2010  . Essential hypertension 02/04/2010  . HYPERTROPHY PROSTATE W/O UR OBST & OTH LUTS 12/10/2009  . Osteopenia 12/07/2009  . PERIPHERAL NEUROPATHY 08/29/2009  . FECAL INCONTINENCE 01/29/2009  . OSTEOARTHRITIS, ANKLE, RIGHT 11/01/2008  . ERECTILE DYSFUNCTION 12/02/2007  . TREMOR 12/02/2007  . CARCINOMA, SKIN, SQUAMOUS CELL 09/16/2006  . B12 DEFICIENCY 08/27/2006  . HYPERTRIGLYCERIDEMIA 08/27/2006   Past Medical History:  Past Medical History  Diagnosis Date  . Muscle tremor     saw neurology remotely elsewhere, was Rx inderal  .  Crohn's ileocolitis ([place])   . Osteopenia     per DEXA 11/2008  . Hypertension 11/11    mild  . Hemorrhoids   . Hypertrophy of prostate     w/o UR obst & oth luts  . Peripheral neuropathy ([facility])     h/o neg w/u  . ED (erectile dysfunction)   . Hypertriglyceridemia   . COPD (chronic obstructive pulmonary disease) ([place])     recent dx--no acute problems  . Hiatal hernia 1956    found while in service  . Anxiety   . Atrial fibrillation with RVR ([name])   . Long term (current) use of anticoagulants 02/21/2011  . Serrated adenoma of colon 03/2010  . Renal cyst     bilateral  . Pulmonary fibrosis ([name]) 2015    Rx Esbriet ~ 10-2013 ([name]), Dr. [name]  . On home oxygen therapy     "2L; 20-24h for the past week" (01/26/2015)  . Emphysema lung ([name]) 2016    Dr. [name]  . B12 deficiency anemia   . Idiopathic pulmonary fibrosis ([name]) 2016    Dr. [name]  . Pneumonia ?2015  . CAP (community acquired pneumonia) 01/26/2015  . Osteoarthritis     "maybe in my hands, feet" (01/26/2015)  . Depression   . Squamous cell carcinoma of skin of scalp     tx'd ~ 60yrs; "froze them off"  . Squamous cell carcinoma, face      "froze them off"  . Melanoma of scalp [facility])    Past Surgical History:  Past Surgical History  Procedure Laterality Date  . Colon resection  1984    resection of distal ileum and cecum w/ appendectomy,  18-inch small intestine, for Crohn's disease  . Colon surgery    . Appendectomy  1984  . Cataract extraction w/ intraocular lens  implant, bilateral Bilateral 2007  . Cholecystectomy  02/17/2011    Procedure: LAPAROSCOPIC CHOLECYSTECTOMY WITH INTRAOPERATIVE CHOLANGIOGRAM;  Surgeon: Edward Jolly, MD;  Location: [facility];  Service: General;  Laterality: N/A;  . Mohs surgery Right ~ 2014    "side of my scalp"   Social History:  reports that he quit smoking about 24 years ago. His smoking use included Cigarettes and Pipe. He has a 40 pack-year smoking history. He has never used  smokeless tobacco. He reports that he does not drink alcohol or use illicit drugs.  Family / Support Systems Marital Status: Married How Long?: 30 years Patient Roles: Spouse, Parent Spouse/Significant Other: [name] [phone]  [id number] Children: [name]  [id number] Other Supports: Four children and grandchildren, along with friends Anticipated Caregiver: Wife Ability/Limitations of Caregiver: no limitations but is elderly herself Caregiver Availability: 24/7 Family Dynamics: Close knit family they have four children one is local the others are out of state, but supportive. They have friends and church members who are visiting. Pt recently lost his brother and two close friends.  Social History Preferred language: English Religion: Baptist Cultural Background: No issues Education: [employer] Read: Yes Write: Yes Employment Status: Retired Freight forwarder Issues: No issues Guardian/Conservator: None-according to MD pt is capable of making his own decisions while here. But will make sure wife is aware and included if any decisions need to be made while here.   Abuse/Neglect Physical Abuse: Denies Verbal Abuse: Denies Sexual Abuse: Denies Exploitation of patient/patient's resources: Denies Self-Neglect: Denies  Emotional Status Pt's affect, behavior adn adjustment status: Pt looks willing and is motivated to improve, he has always been independent and proded himself on this. He and wife were very active prior to admission even with his O2, [facility]. he is not able to participate in the interveiw due to speech deficits., but he is listening and making eye contact. Recent Psychosocial Issues: recent bout with pneumonia was hospitalized 11/4-11/5 Pyschiatric History: History of depression is taking buspar and xanax-wife feels it helps him.  He recently lost his brother and two close friends, so when able will have  neuro-psych see him.  Substance Abuse History: No issues  Patient / Family Perceptions, Expectations & Goals Pt/Family understanding of illness & functional limitations: Pt and daughter are able to explain pt's medical issues and stroke deficits. Thye talk with the MD and feel their questions are being answered.  Both are hopeful he will do well here and regain his swallowing and speech. Premorbid pt/family roles/activities: husband, father, grandfather, great grandfather., retiree, church member, etc Anticipated changes in roles/activities/participation: resume Pt/family expectations/goals: Wife states: " I hope he can begin to eat and tell us what he wants, it is hard when he can't."  Daughter states: " I am here for support and hope he does well here."  [employer]: None Premorbid Home Care/DME Agencies: None Transportation available at discharge: Wife Resource referrals recommended: Neuropsychology, Support group (specify)  Discharge Planning Living Arrangements: Spouse/significant other Support Systems: Spouse/significant other, Children, Other relatives, Water engineer, Social worker community Type of Residence: Private residence Insurance underwriter Resources: [employer], Multimedia programmer (specify) Scientist, clinical (histocompatibility and immunogenetics)) Financial Resources: Radio broadcast assistant Screen Referred: No Living Expenses: Own Money Management: Spouse, Patient Does the patient have any problems obtaining your medications?: No Home Management: both  help one another Patient/Family Preliminary Plans: Return home with wife who can assist with his care as long as it is not heavy physical care. She is here to attend therapies with pt and see his progress. Daughter is also here from [place] to provide support. Social Work Anticipated Follow Up Needs: HH/OP, Support Group  Clinical Impression Pleasant couple who have been married for 61 years. Pt has always been independent and active even with his health  issues-pulmonary fibrosis. Wife is supportive and will assist at discharge. Both are hopeful he will be able to swallow and express his needs. He should do well physically here and hope he will do well with his speech and swallow. Wife aware pt will require 24 hr care due to Safety and speech issues. Daughter plans to stay for a short time then go back to [place].   Elease Hashimoto 02/05/2015, 11:27 AM

## 2015-02-05 NOTE — Progress Notes (Signed)
Physical Therapy Note  Patient Details  Name: JONTEZ STITT MRN: [id number] Date of Birth: 04/03/1928 Today's Date: 02/05/2015    Time: [id number] minutes  1:1 No c/o pain.  Pt eager to participate in therapy.  Pt able to perform stand pivot transfers with close supervision, increased time.  Gait with RW 100' x 2, 50' in controlled environment with close supervision. Obstacle negotiation and gait with RW on carpet to simulate home with min A, pt responding well to visual cues and demonstration.  Standing balance with horseshoe toss game with close supervision with 1 UE support. Pt able to stand x 4 minutes without fatigue.  Standing therex with demo and visual cues, pt able to perform with min/mod cuing 2 x 10 marching, hip abd/add, mini squats, HS curls.  Stair negotiation for LE strengthening 4 stairs with B handrails x 2.  Curb step negotiation with RW with min A, no LOB.  Pt spO2 >90% throughout session on 4L O2.  Pt with no visible SOB during session.  Pt with good spontaneous speech when meeting therapy dog, difficulty with naming other familiar objects and people with multimodal cues.   Aleksandr Pellow 02/05/2015, 2:56 PM

## 2015-02-05 NOTE — Care Management Note (Signed)
[facility] Individual Statement of Services  Patient Name:  Brian [name]  Date:  02/05/2015  Welcome to the [facility].  Our goal is to provide you with an individualized program based on your diagnosis and situation, designed to meet your specific needs.  With this comprehensive rehabilitation program, you will be expected to participate in at least 3 hours of rehabilitation therapies Monday-Friday, with modified therapy programming on the weekends.  Your rehabilitation program will include the following services:  Physical Therapy (PT), Occupational Therapy (OT), Speech Therapy (ST), 24 hour per day rehabilitation nursing, Neuropsychology, Case Management (Social Worker), Rehabilitation Medicine, Nutrition Services and Pharmacy Services  Weekly team conferences will be held on Wednesday to discuss your progress.  Your Social Worker will talk with you frequently to get your input and to update you on team discussions.  Team conferences with you and your family in attendance may also be held.  Expected length of stay: 10-12 days Overall anticipated outcome: supervision with cueing  Depending on your progress and recovery, your program may change. Your Social Worker will coordinate services and will keep you informed of any changes. Your Social Worker's name and contact numbers are listed  below.  The following services may also be recommended but are not provided by the [facility] will be made to provide these services after discharge if needed.  Arrangements include referral to agencies that provide these services.  Your insurance has been verified to be:  Medicare & Aetna Your primary doctor is:  [name]  Pertinent information will be shared with your doctor and your insurance company.  Social Worker:  Ovidio Kin, [place] or ([phone]  Information discussed with and copy given to patient by: Elease Hashimoto, 02/05/2015, 10:53 AM

## 2015-02-05 NOTE — Progress Notes (Signed)
Occupational Therapy Session Note  Patient Details  Name: Brian [name] MRN: [id number] Date of Birth: 1928/08/14  Today's Date: 02/05/2015 OT Individual Time: 1104-1200 OT Individual Time Calculation (min): 56 min    Short Term Goals: Week 1:  OT Short Term Goal 1 (Week 1): Pt will bathe with S. OT Short Term Goal 2 (Week 1): Pt will be able to step in and out of tub and/or walk in shower with S. OT Short Term Goal 3 (Week 1): Pt will demonstrate improved activity tolerance to stand for 5 minutes without O2 sats dropping below 92% on oxygen supplementation.  Skilled Therapeutic Interventions/Progress Updates:    Pt completed bathing, dressing, and grooming tasks during session.  He needed mod to max instructional cueing to complete sequencing of bathing and dressing tasks.  He was able to ambulate to and from the shower and sink throughout session with min guard assist.  Sit to stand with bathing min guard assist as well.  He was able to complete all actual bathing without any physical assist except for steadiness with standing.  Pt completed dressing sit to stand at the bedside chair with min guard assist.  Pt's wife present for session as well and able to observe.  He was able to follow approximately 50% of instructional cues during selfcare tasks in general secondary to receptive difficulty.  Pt noted with difficulty expressing words as well with only 25% or less legible.  Pt left in in bedside chair with family present.  No safety belt requested from family if they are present.  Therapist agrees with this.   Therapy Documentation Precautions:  Precautions Precautions: Fall Precaution Comments: NPO Restrictions Weight Bearing Restrictions: No  Pain: Pain Assessment Pain Assessment: No/denies pain ADL: See Function Navigator for Current Functional Status.   Therapy/Group: Individual Therapy  Kelis Plasse OTR/L 02/05/2015, 12:41 PM

## 2015-02-05 NOTE — Progress Notes (Signed)
Subjective/Complaints: Remain aphasic, receptive>exp Difficulty with repetition as well as naming  ROS cannot obtain due to aphasia  Objective: Vital Signs: Blood pressure 121/66, pulse 90, temperature 97.9 F (36.6 C), temperature source Oral, resp. rate 18, weight 62.2 kg (137 lb 2 oz), SpO2 98 %. No results found. Results for orders placed or performed during the hospital encounter of 02/02/15 (from the past 72 hour(s))  CBC WITH DIFFERENTIAL     Status: Abnormal   Collection Time: 02/03/15  4:26 AM  Result Value Ref Range   WBC 15.7 (H) 4.0 - 10.5 K/uL   RBC 3.92 (L) 4.22 - 5.81 MIL/uL   Hemoglobin 12.5 (L) 13.0 - 17.0 g/dL   HCT 37.2 (L) 39.0 - 52.0 %   MCV 94.9 78.0 - 100.0 fL   MCH 31.9 26.0 - 34.0 pg   MCHC 33.6 30.0 - 36.0 g/dL   RDW 12.6 11.5 - 15.5 %   Platelets 242 150 - 400 K/uL   Neutrophils Relative % 81 %   Neutro Abs 12.7 (H) 1.7 - 7.7 K/uL   Lymphocytes Relative 7 %   Lymphs Abs 1.1 0.7 - 4.0 K/uL   Monocytes Relative 6 %   Monocytes Absolute 0.9 0.1 - 1.0 K/uL   Eosinophils Relative 6 %   Eosinophils Absolute 1.0 (H) 0.0 - 0.7 K/uL   Basophils Relative 0 %   Basophils Absolute 0.0 0.0 - 0.1 K/uL  Comprehensive metabolic panel     Status: Abnormal   Collection Time: 02/03/15  4:26 AM  Result Value Ref Range   Sodium 136 135 - 145 mmol/L   Potassium 3.5 3.5 - 5.1 mmol/L   Chloride 100 (L) 101 - 111 mmol/L   CO2 28 22 - 32 mmol/L   Glucose, Bld 129 (H) 65 - 99 mg/dL   BUN 15 6 - 20 mg/dL   Creatinine, Ser 0.86 0.61 - 1.24 mg/dL   Calcium 8.4 (L) 8.9 - 10.3 mg/dL   Total Protein 6.3 (L) 6.5 - 8.1 g/dL   Albumin 1.8 (L) 3.5 - 5.0 g/dL   AST 36 15 - 41 U/L   ALT 27 17 - 63 U/L   Alkaline Phosphatase 98 38 - 126 U/L   Total Bilirubin 0.5 0.3 - 1.2 mg/dL   GFR calc non Af Amer >60 >60 mL/min   GFR calc Af Amer >60 >60 mL/min    Comment: (NOTE) The eGFR has been calculated using the CKD EPI equation. This calculation has not been validated in all  clinical situations. eGFR's persistently <60 mL/min signify possible Chronic Kidney Disease.    Anion gap 8 5 - 15  C difficile quick scan w PCR reflex     Status: None   Collection Time: 02/03/15 12:11 PM  Result Value Ref Range   C Diff antigen NEGATIVE NEGATIVE   C Diff toxin NEGATIVE NEGATIVE   C Diff interpretation Negative for toxigenic C. difficile      HEENT: normal Cardio: irregular and tachy Resp: CTA B/L and unlabored GI: BS positive and NT, ND Extremity:  No Edema Skin:   Intact Neuro: Abnormal Sensory cannot assess due to aphasia, Abnormal Motor 5/5 in BUE and BLE and Other receptive and exp language deficits Musc/Skel:  Normal and Other No pain ROM of UE or LE Gen NAD   Assessment/Plan: 1. Functional deficits secondary to Left MCA which require 3+ hours per day of interdisciplinary therapy in a comprehensive inpatient rehab setting. Physiatrist is providing close team  supervision and 24 hour management of active medical problems listed below. Physiatrist and rehab team continue to assess barriers to discharge/monitor patient progress toward functional and medical goals. FIM: Function - Bathing Position: Shower Body parts bathed by patient: Right arm, Left arm, Chest, Abdomen, Front perineal area, Buttocks, Right upper leg, Back, Left lower leg, Right lower leg, Left upper leg  Function- Upper Body Dressing/Undressing What is the patient wearing?: Pull over shirt/dress Pull over shirt/dress - Perfomed by patient: Thread/unthread right sleeve, Thread/unthread left sleeve, Pull shirt over trunk Pull over shirt/dress - Perfomed by helper: Put head through opening Function - Lower Body Dressing/Undressing What is the patient wearing?: Pants, Socks, Shoes Position: Wheelchair/chair at sink Pants- Performed by patient: Thread/unthread right pants leg, Thread/unthread left pants leg, Pull pants up/down Socks - Performed by patient: Don/doff right sock, Don/doff left  sock Shoes - Performed by patient: Don/doff right shoe, Don/doff left shoe, Fasten right, Fasten left Assist for lower body dressing: Touching or steadying assistance (Pt > 75%)     Function - Air cabin crew transfer activity did not occur: N/A  Function - Chair/bed transfer Chair/bed transfer method: Stand pivot Chair/bed transfer assist level: Touching or steadying assistance (Pt > 75%) Chair/bed transfer assistive device: Other (HHA) Chair/bed transfer details: Manual facilitation for placement, Verbal cues for sequencing, Verbal cues for technique, Verbal cues for precautions/safety  Function - Locomotion: Wheelchair Will patient use wheelchair at discharge?: Yes Type: Manual Max wheelchair distance: 150 Assist Level: Supervision or verbal cues Assist Level: Supervision or verbal cues Wheel 150 feet activity did not occur: Safety/medical concerns Assist Level: Supervision or verbal cues Turns around,maneuvers to table,bed, and toilet,negotiates 3% grade,maneuvers on rugs and over doorsills: No Function - Locomotion: Ambulation Assistive device: Hand held assist Max distance: 150 Assist level: 2 helpers Assist level: 2 helpers Walk 50 feet with 2 turns activity did not occur: Safety/medical concerns Assist level: 2 helpers Walk 150 feet activity did not occur: Safety/medical concerns Assist level: 2 helpers Walk 10 feet on uneven surfaces activity did not occur: Safety/medical concerns Assist level: 2 helpers  Function - Comprehension Comprehension: Auditory Comprehension assist level: Understands basic 25 - 49% of the time/ requires cueing 50 - 75% of the time  Function - Expression Expression: Verbal Expression assist level: Expresses basic 25 - 49% of the time/requires cueing 50 - 75% of the time. Uses single words/gestures.  Function - Social Interaction Social Interaction assist level: Interacts appropriately 75 - 89% of the time - Needs redirection for  appropriate language or to initiate interaction.  Function - Problem Solving Problem solving assist level: Solves basic 25 - 49% of the time - needs direction more than half the time to initiate, plan or complete simple activities  Function - Memory Memory assist level: Recognizes or recalls 25 - 49% of the time/requires cueing 50 - 75% of the time Patient normally able to recall (first 3 days only): None of the above  Medical Problem List and Plan: 1. 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  DVT Prophylaxis/Anticoagulation: Eliquis. Monitor platelet counts and any signs of bleeding 3. Pain Management: Tylenol as needed             -no pain at present 4. Mood: BuSpar 15 mg twice a day, Klonopin 0.25 mg 3 times daily as needed             -may need further medication adjustment             -  continued egosupport, environmental mod 5. Neuropsych: This patient is capable of making decisions on his own behalf. 6. Skin/Wound Care: Routine skin checks 7. Fluids/Electrolytes/Nutrition: Routine I&O follow-up chemistries             -adjust h2o flushes as indicated             -replace potassium, need to monitor closely given diarrhea 8. Dysphagia. Nothing by mouth. Nasogastric tube in place. Follow-up speech therapy for advancement             -pt is high aspiration/pneumonia risk given dysphagia and PF 9. Healthcare associated pneumonia as well as history of pulmonary fibrosis. Continue Levaquin. Chronic oxygen as directed, was on 2L at rest at home and 4L with exercise             -aspiration precautions             -afebrile at present 10. Hyperlipidemia. Lopid/Pravachol 11. Atrial fibrillation:             -HR controlled at present, appropriately elevated with exercise, HR 91 monitor             -eliquis--has had some epistaxis no rucurrent episode 12.  Diarrhea, stool for C diff is neg, has Crohn's, unable to give pentasa CR due to NGT, consider rectal supp, schedule  lomotil, likely combination between Crohns and TF +/- antibiotic associated add probiotic 13.  Reviewed labs personally, elevated WBC likely residual from HCAP, monitor for fever  LOS (Days) 3 A FACE TO FACE EVALUATION WAS PERFORMED  Brian [name] E 02/05/2015, 7:41 AM

## 2015-02-05 NOTE — Progress Notes (Signed)
Speech Language Pathology Daily Session Note  Patient Details  Name: Brian [name] MRN: [id number] Date of Birth: 1928/07/12  Today's Date: 02/05/2015 SLP Individual Time: 1500-1550 SLP Individual Time Calculation (min): 50 min  Short Term Goals: Week 1: SLP Short Term Goal 1 (Week 1): Pt will consume therapeutic ice chip trials with minimal overt signs of aspiration across two therapy sessions to determine readiness for repeat MBS SLP Short Term Goal 2 (Week 1): Pt will utilize multimodal communication methods to communicate basic wants/needs 75% of the time with Max cues SLP Short Term Goal 3 (Week 1): Pt will follow one-step commands with 75% accuracy with Max cues within basic, familiar tasks  Skilled Therapeutic Interventions: Skilled treatment session focused on dysphagia goals and family education.  Upon arrival, patient was upright in the recliner and agreeable to participate in treatment session.  Patient demonstrated a wet vocal quality throughout the session and required Mod A verbal cues to self-monitor and correct with a cued throat clear, however, patient unable to perform a volitional cough despite Max A multimodal cues.  SLP also performed oral care to remove dried secretions to minimize oral bacteria. SLP educated on family on patient's current swallowing function, importance of oral care, and goals of skilled SLP intervention. Both the patient's wife and his daughter verbalized understanding.  Patient reported he did not feel well and expectorated what appeared to be secretions or liquid medications per RN. Trials were not attempted today due to poor managemnet of secretions and not feeling well with expectoration. Patient answered basic yes/no questions with 80% accuracy and followed basic 1 step commands with 100% accuracy but required total A for 2 step commands. 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 Patient also demonstrated intermittent automatic  phrases and was able to report to the clinician when he was unable to understand her requests/question. Patient left upright in recliner with family present. Continue with current plan of care.    Function:   Cognition Comprehension Comprehension assist level: Understands basic 25 - 49% of the time/ requires cueing 50 - 75% of the time  Expression   Expression assist level: Expresses basic 25 - 49% of the time/requires cueing 50 - 75% of the time. Uses single words/gestures.  Social Interaction Social Interaction assist level: Interacts appropriately 75 - 89% of the time - Needs redirection for appropriate language or to initiate interaction.  Problem Solving Problem solving assist level: Solves basic 25 - 49% of the time - needs direction more than half the time to initiate, plan or complete simple activities  Memory Memory assist level: Recognizes or recalls 25 - 49% of the time/requires cueing 50 - 75% of the time    Pain Pain Assessment Pain Assessment: No/denies pain  Therapy/Group: Individual Therapy  Dezmen Alcock 02/05/2015, 4:01 PM

## 2015-02-05 NOTE — Progress Notes (Signed)
O2 at 3-4L/m via [place]. Congested, productive cough. Sputum yellow and white. Multi loose, incontinent stools, PRN lomotil given at 0402. Groin and sacrum red, barrier cream applied. Brian [name] A

## 2015-02-05 NOTE — Progress Notes (Signed)
Physical Therapy Session Note  Patient Details  Name: Brian [name] MRN: [id number] Date of Birth: January 09, 1929  Today's Date: 02/05/2015 PT Individual Time: 0800-0830 PT Individual Time Calculation (min): 30 min   Short Term Goals: Week 1:  PT Short Term Goal 1 (Week 1): STGs = LTGs due to ELOS  Skilled Therapeutic Interventions/Progress Updates:   Pt received in bed resting; no c/o pain.  Pt donned pants in supine with supervision; performed supine > sit EOB with one step visual and verbal cues and supervision. Pt sat EOB to don shoes bilaterally with supervision with improved attention to R and L side.  Performed BERG balance assessment; see below.  Discussed falls risk with pt and continued focus on balance.  In standing pt presents with posterior bias and difficulty maintaining COG anterior over BOS; will continue to address. Pt left in recliner to rest with quick release belt on and all items within reach.   Therapy Documentation Precautions:  Precautions Precautions: Fall Restrictions Weight Bearing Restrictions: No Vital Signs: Therapy Vitals Pulse Rate: (!) 104 Patient Position (if appropriate): Sitting Oxygen Therapy SpO2: 90 % O2 Device: Nasal Cannula O2 Flow Rate (L/min): 4 L/min Pulse Oximetry Type: Intermittent Pain: Pain Assessment Pain Assessment: No/denies pain Balance: Standardized Balance Assessment Standardized Balance Assessment: Berg Balance Test Berg Balance Test Sit to Stand: Able to stand  independently using hands Standing Unsupported: Able to stand 2 minutes with supervision Sitting with Back Unsupported but Feet Supported on Floor or Stool: Able to sit safely and securely 2 minutes Stand to Sit: Controls descent by using hands Transfers: Able to transfer with verbal cueing and /or supervision Standing Unsupported with Eyes Closed: Able to stand 10 seconds with supervision Standing Ubsupported with Feet Together: Able to place feet together  independently and stand for 1 minute with supervision From Standing, Reach Forward with Outstretched Arm: Can reach forward >12 cm safely (5") From Standing Position, Pick up Object from Floor: Unable to try/needs assist to keep balance From Standing Position, Turn to Look Behind Over each Shoulder: Turn sideways only but maintains balance Turn 360 Degrees: Needs assistance while turning Standing Unsupported, Alternately Place Feet on Step/Stool: Needs assistance to keep from falling or unable to try Standing Unsupported, One Foot in Front: Able to take small step independently and hold 30 seconds Standing on One Leg: Unable to try or needs assist to prevent fall Total Score: 28 Patient demonstrates increased fall risk as noted by score of 28/56 on Berg Balance Scale.  (<36= high risk for falls, close to 100%; 37-45 significant >80%; 46-51 moderate >50%; 52-55 lower >25%)   See Function Navigator for Current Functional Status.   Therapy/Group: Individual Therapy  Raylene Everts [facility] 02/05/2015, 11:17 AM

## 2015-02-05 NOTE — Plan of Care (Signed)
Problem: RH BOWEL ELIMINATION Goal: RH STG MANAGE BOWEL WITH ASSISTANCE STG Manage Bowel with Mod Assistance.  Outcome: Not Progressing Incontinent of stool, total assist. Goal: RH STG MANAGE BOWEL W/MEDICATION W/ASSISTANCE STG Manage Bowel with Medication with Mod Assistance.  Outcome: Not Progressing Continues with Loose stools with PRN meds.

## 2015-02-06 ENCOUNTER — Inpatient Hospital Stay (HOSPITAL_COMMUNITY): Payer: Medicare Other | Admitting: Occupational Therapy

## 2015-02-06 ENCOUNTER — Inpatient Hospital Stay (HOSPITAL_COMMUNITY): Payer: Self-pay | Admitting: Physical Therapy

## 2015-02-06 ENCOUNTER — Inpatient Hospital Stay (HOSPITAL_COMMUNITY): Payer: Medicare Other | Admitting: Speech Pathology

## 2015-02-06 LAB — CBC
HCT: 36.4 % — ABNORMAL LOW (ref 39.0–52.0)
Hemoglobin: 11.8 g/dL — ABNORMAL LOW (ref 13.0–17.0)
MCH: 31.6 pg (ref 26.0–34.0)
MCHC: 32.4 g/dL (ref 30.0–36.0)
MCV: 97.3 fL (ref 78.0–100.0)
PLATELETS: 234 10*3/uL (ref 150–400)
RBC: 3.74 MIL/uL — ABNORMAL LOW (ref 4.22–5.81)
RDW: 12.7 % (ref 11.5–15.5)
WBC: 15.6 10*3/uL — ABNORMAL HIGH (ref 4.0–10.5)

## 2015-02-06 LAB — BASIC METABOLIC PANEL
Anion gap: 5 (ref 5–15)
BUN: 17 mg/dL (ref 6–20)
CALCIUM: 8.2 mg/dL — AB (ref 8.9–10.3)
CO2: 33 mmol/L — AB (ref 22–32)
CREATININE: 0.78 mg/dL (ref 0.61–1.24)
Chloride: 99 mmol/L — ABNORMAL LOW (ref 101–111)
GFR calc Af Amer: >60 mL/min (ref >60)
GFR calc non Af Amer: >60 mL/min (ref >60)
GLUCOSE: 146 mg/dL — AB (ref 65–99)
Potassium: 3.7 mmol/L (ref 3.5–5.1)
Sodium: 137 mmol/L (ref 135–145)

## 2015-02-06 LAB — HEMOGLOBIN A1C
HEMOGLOBIN A1C: 5.6 % (ref 4.8–5.6)
MEAN PLASMA GLUCOSE: 114 mg/dL

## 2015-02-06 NOTE — Progress Notes (Signed)
Subjective/Complaints: Bright and alert this am  ROS cannot obtain due to aphasia  Objective: Vital Signs: Blood pressure 114/61, pulse 87, temperature 98.4 F (36.9 C), temperature source Oral, resp. rate 19, weight 64.048 kg (141 lb 3.2 oz), SpO2 99 %. No results found. Results for orders placed or performed during the hospital encounter of 02/02/15 (from the past 72 hour(s))  C difficile quick scan w PCR reflex     Status: None   Collection Time: 02/03/15 12:11 PM  Result Value Ref Range   C Diff antigen NEGATIVE NEGATIVE   C Diff toxin NEGATIVE NEGATIVE   C Diff interpretation Negative for toxigenic C. difficile   Basic metabolic panel     Status: Abnormal   Collection Time: 02/06/15  5:14 AM  Result Value Ref Range   Sodium 137 135 - 145 mmol/L   Potassium 3.7 3.5 - 5.1 mmol/L   Chloride 99 (L) 101 - 111 mmol/L   CO2 33 (H) 22 - 32 mmol/L   Glucose, Bld 146 (H) 65 - 99 mg/dL   BUN 17 6 - 20 mg/dL   Creatinine, Ser 0.78 0.61 - 1.24 mg/dL   Calcium 8.2 (L) 8.9 - 10.3 mg/dL   GFR calc non Af Amer >60 >60 mL/min   GFR calc Af Amer >60 >60 mL/min    Comment: (NOTE) The eGFR has been calculated using the CKD EPI equation. This calculation has not been validated in all clinical situations. eGFR's persistently <60 mL/min signify possible Chronic Kidney Disease.    Anion gap 5 5 - 15  CBC     Status: Abnormal   Collection Time: 02/06/15  5:14 AM  Result Value Ref Range   WBC 15.6 (H) 4.0 - 10.5 K/uL   RBC 3.74 (L) 4.22 - 5.81 MIL/uL   Hemoglobin 11.8 (L) 13.0 - 17.0 g/dL   HCT 36.4 (L) 39.0 - 52.0 %   MCV 97.3 78.0 - 100.0 fL   MCH 31.6 26.0 - 34.0 pg   MCHC 32.4 30.0 - 36.0 g/dL   RDW 12.7 11.5 - 15.5 %   Platelets 234 150 - 400 K/uL     HEENT: normal Cardio: irregular and tachy Resp: CTA B/L and unlabored GI: BS positive and NT, ND Extremity:  No Edema Skin:   Intact Neuro: Abnormal Sensory cannot assess due to aphasia, Abnormal Motor 5/5 in BUE and BLE and  Other receptive and exp language deficits Musc/Skel:  Normal and Other No pain ROM of UE or LE Gen NAD   Assessment/Plan: 1. Functional deficits secondary to Left MCA which require 3+ hours per day of interdisciplinary therapy in a comprehensive inpatient rehab setting. Physiatrist is providing close team supervision and 24 hour management of active medical problems listed below. Physiatrist and rehab team continue to assess barriers to discharge/monitor patient progress toward functional and medical goals. FIM: Function - Bathing Position: Shower Body parts bathed by patient: Right arm, Left arm, Chest, Abdomen, Front perineal area, Buttocks, Right upper leg, Back, Left lower leg, Right lower leg, Left upper leg Assist Level: Touching or steadying assistance(Pt > 75%)  Function- Upper Body Dressing/Undressing What is the patient wearing?: Pull over shirt/dress Pull over shirt/dress - Perfomed by patient: Thread/unthread right sleeve, Thread/unthread left sleeve, Pull shirt over trunk Pull over shirt/dress - Perfomed by helper: Put head through opening Assist Level: Set up Set up : To obtain clothing/put away Function - Lower Body Dressing/Undressing What is the patient wearing?: Pants, Socks, Shoes, Underwear Position:  Other (comment) (bedside chair) Underwear - Performed by patient: Thread/unthread right underwear leg, Thread/unthread left underwear leg, Pull underwear up/down Pants- Performed by patient: Thread/unthread right pants leg, Thread/unthread left pants leg, Pull pants up/down, Fasten/unfasten pants Socks - Performed by patient: Don/doff right sock, Don/doff left sock Shoes - Performed by patient: Don/doff right shoe, Don/doff left shoe, Fasten right, Fasten left Assist for lower body dressing: Touching or steadying assistance (Pt > 75%)  Function - Toileting Toileting activity did not occur: No continent bowel/bladder event  Function - Air cabin crew transfer  activity did not occur: N/A  Function - Chair/bed transfer Chair/bed transfer method: Stand pivot Chair/bed transfer assist level: Touching or steadying assistance (Pt > 75%) Chair/bed transfer assistive device: Armrests Chair/bed transfer details: Manual facilitation for weight shifting  Function - Locomotion: Wheelchair Will patient use wheelchair at discharge?: Yes Type: Manual Max wheelchair distance: 150 Assist Level: Supervision or verbal cues Assist Level: Supervision or verbal cues Wheel 150 feet activity did not occur: Safety/medical concerns Assist Level: Supervision or verbal cues Turns around,maneuvers to table,bed, and toilet,negotiates 3% grade,maneuvers on rugs and over doorsills: No Function - Locomotion: Ambulation Assistive device: Hand held assist Max distance: 150 Assist level: 2 helpers Assist level: 2 helpers Walk 50 feet with 2 turns activity did not occur: Safety/medical concerns Assist level: 2 helpers Walk 150 feet activity did not occur: Safety/medical concerns Assist level: 2 helpers Walk 10 feet on uneven surfaces activity did not occur: Safety/medical concerns Assist level: 2 helpers  Function - Comprehension Comprehension: Auditory Comprehension assist level: Understands basic 25 - 49% of the time/ requires cueing 50 - 75% of the time  Function - Expression Expression: Verbal Expression assist level: Expresses basic 25 - 49% of the time/requires cueing 50 - 75% of the time. Uses single words/gestures.  Function - Social Interaction Social Interaction assist level: Interacts appropriately 75 - 89% of the time - Needs redirection for appropriate language or to initiate interaction.  Function - Problem Solving Problem solving assist level: Solves basic 25 - 49% of the time - needs direction more than half the time to initiate, plan or complete simple activities  Function - Memory Memory assist level: Recognizes or recalls 25 - 49% of the  time/requires cueing 50 - 75% of the time Patient normally able to recall (first 3 days only): None of the above  Medical Problem List and Plan: 1. 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  DVT Prophylaxis/Anticoagulation: Eliquis. Monitor CBC and BMET, small drop in hgb but no overt bleeding 3. Pain Management: Tylenol as needed             -no pain at present 4. Mood: BuSpar 15 mg twice a day, Klonopin 0.25 mg 3 times daily as needed             -may need further medication adjustment             -continued egosupport, environmental mod 5. Neuropsych: This patient is capable of making decisions on his own behalf. 6. Skin/Wound Care: Routine skin checks 7. Fluids/Electrolytes/Nutrition: Routine I&O follow-up chemistries             -adjust h2o flushes as indicated             -replace potassium, need to monitor closely given diarrhea 8. Dysphagia. Nothing by mouth. Nasogastric tube in place. Follow-up speech therapy for advancement             -pt is  high aspiration/pneumonia risk given dysphagia and PF 9. Healthcare associated pneumonia as well as history of pulmonary fibrosis. Continue Levaquin. Chronic oxygen as directed, was on 2L at rest at home and 4L with exercise             -aspiration precautions             -afebrile at present 10. Hyperlipidemia. Lopid/Pravachol 11. Atrial fibrillation:             -HR controlled at present, appropriately elevated with exercise, HR 91 monitor             -eliquis--has had some epistaxis no rucurrent episode, no blood in stools reported 12.  Diarrhea, stool for C diff is neg, has Crohn's, unable to give pentasa CR due to NGT, consider rectal supp, schedule lomotil, likely combination between Crohns and TF +/- antibiotic associated add probiotic 13.  Reviewed labs personally, elevated WBC likely residual from HCAP, Crohns may also affect, monitor for fever  LOS (Days) 4 A FACE TO FACE EVALUATION WAS  PERFORMED  KIRSTEINS,ANDREW E 02/06/2015, 7:52 AM

## 2015-02-06 NOTE — Plan of Care (Signed)
Problem: RH BOWEL ELIMINATION Goal: RH STG MANAGE BOWEL WITH ASSISTANCE STG Manage Bowel with Mod Assistance.  Outcome: Not Progressing Total assist-change brief x1

## 2015-02-06 NOTE — Progress Notes (Signed)
Speech Language Pathology Daily Session Note  Patient Details  Name: Brian [name] MRN: [id number] Date of Birth: [birth year]  Today's Date: 02/06/2015 SLP Individual Time: 0900-1000 SLP Individual Time Calculation (min): 60 min  Short Term Goals: Week 1: SLP Short Term Goal 1 (Week 1): Pt will consume therapeutic ice chip trials with minimal overt signs of aspiration across two therapy sessions to determine readiness for repeat MBS SLP Short Term Goal 2 (Week 1): Pt will utilize multimodal communication methods to communicate basic wants/needs 75% of the time with Max cues SLP Short Term Goal 3 (Week 1): Pt will follow one-step commands with 75% accuracy with Max cues within basic, familiar tasks  Skilled Therapeutic Interventions:  Pt was seen for skilled ST targeting communication and dysphagia.  SLP administered trials of ice chips following thorough oral care to continue working towards initiation of PO diet.   Pt demonstrated weak, delayed cough on 3 out of 5 trials of ice chips which SLP suspects to be related to weakened laryngeal elevation and delayed swallow initiation per MBS.  Pt required max to total assist to monitor and correct perseverative verbal errors when attempting to name basic, familiar objects.  He was able to identify an object when named from a field of three with max faded to min assist verbal cues.  Additionally, pt was able to match object to word from a field of three for >80% accuracy with mod faded to assist verbal cues.  SLP increased task challenge for ongoing diagnostic treatment of reading comprehension and pt was able to match object to descriptive phrase from a field of three with overall mod assist verbal cues to recognize and correct errors.  Pt was left in bed with bed alarm set and call bell left within reach.  Continue per current plan of care.    Function:  Eating Eating                 Cognition Comprehension Comprehension assist level:  Understands basic 25 - 49% of the time/ requires cueing 50 - 75% of the time  Expression   Expression assist level: Expresses basic 25 - 49% of the time/requires cueing 50 - 75% of the time. Uses single words/gestures.  Social Interaction Social Interaction assist level: Interacts appropriately 75 - 89% of the time - Needs redirection for appropriate language or to initiate interaction.  Problem Solving Problem solving assist level: Solves basic 25 - 49% of the time - needs direction more than half the time to initiate, plan or complete simple activities  Memory Memory assist level: Recognizes or recalls 25 - 49% of the time/requires cueing 50 - 75% of the time    Pain Pain Assessment Pain Assessment: Faces Faces Pain Scale: No hurt  Therapy/Group: Individual Therapy  Deylan Canterbury, Selinda Orion 02/06/2015, 12:26 PM

## 2015-02-06 NOTE — Progress Notes (Addendum)
Occupational Therapy Session Note  Patient Details  Name: Brian [name] MRN: [id number] Date of Birth: 04/26/1928  Today's Date: 02/06/2015 OT Individual Time: 1006-1100 OT Individual Time Calculation (min): 54 min    Short Term Goals: Week 1:  OT Short Term Goal 1 (Week 1): Pt will bathe with S. OT Short Term Goal 2 (Week 1): Pt will be able to step in and out of tub and/or walk in shower with S. OT Short Term Goal 3 (Week 1): Pt will demonstrate improved activity tolerance to stand for 5 minutes without O2 sats dropping below 92% on oxygen supplementation.  Skilled Therapeutic Interventions/Progress Updates:    Pt performed shower and dressing this session.  Mod instructional cueing to sequence through the shower this session.  Attempted questioning cueing however pt unable to follow or understand majority of them.  Min assist for transfer to the shower and then back to the EOB for dressing.  Pt with decreased awareness of the right visual field when looking for soap or clothing.  Min steady assist for standing balance with dressing as well.  Pt only stating "yea" during session.  Not able to state as many words this session as previous session.  Pt left in bedside chair with wife present for session.  Pt's O2 sats decreased to 87% on 4Ls after completion of dressing tasks.  Pt needed max instructional cueing for purse lip breathing to increase sats back to greater than 91%.    Therapy Documentation Precautions:  Precautions Precautions: Fall Precaution Comments: NPO Restrictions Weight Bearing Restrictions: No General:   Vital Signs: Therapy Vitals Pulse Rate: (!) 115 Patient Position (if appropriate): Sitting Oxygen Therapy SpO2: (!) 87 % O2 Device: Nasal Cannula Pulse Oximetry Type: Intermittent Pain: Pain Assessment Pain Assessment: Faces Faces Pain Scale: No hurt ADL: See Function Navigator for Current Functional Status.   Therapy/Group: Individual  Therapy  Naren Benally OTR/L 02/06/2015, 12:18 PM

## 2015-02-06 NOTE — Progress Notes (Signed)
Physical Therapy Session Note  Patient Details  Name: Brian [name] MRN: [id number] Date of Birth: December 27, 1928  Today's Date: 02/06/2015 PT Individual Time: 1400-1530 PT Individual Time Calculation (min): 90 min   Short Term Goals: Week 1:  PT Short Term Goal 1 (Week 1): STGs = LTGs due to ELOS  Skilled Therapeutic Interventions/Progress Updates:    Pt received supine in bed with no c/o pain and agreeable to treatment. Communication limited throughout session due to aphasia. W/c propulsio x150' with BLEs for LE strengthening and aerobic endurance. Performed gait training x150' with RW and close S/CGA with verbal cues for upright posture and forward gaze. Gait for 3 trials of 100' with HHA, occasional staggering to R side. Standing balance with cognitive dual task matching cards to board in front of pt. Balance with close S overall, however pt has difficulty with command following, identifying matching card. Activity transitioned to sitting position to allow increased attention and focus to cognitive task, however continues to require max verbal/visual cues to accurately identify and match cards. Pt urgently states he needs to use the restroom; returned to room with totalA due to urgency. Sit <>stand at toilet and standing balance with close S while pt urinated. Washed hands while seated in w/c with S. Returned to gym with pt propelling using BLEs. Gait with HHA around gym to locate horseshoes; performed for dynamic balance as well as attention, visual scanning. Gait to return to room with HHA required two seated rest breaks due to fatigue. O2 monitored throughout session while on 4L O2; following 2-3 min standing activity pt desaturates to mid/low 80's and requires 2-3 min and verbal cues for deep breathing to increase to WNL. Pt returned to 4L O2 via [place] at end of session.   Therapy Documentation Precautions:  Precautions Precautions: Fall Precaution Comments: NPO Restrictions Weight Bearing  Restrictions: No Pain: Pain Assessment Pain Score: 0-No pain Faces Pain Scale: No hurt   See Function Navigator for Current Functional Status.   Therapy/Group: Individual Therapy  Luberta Mutter 02/06/2015, 3:46 PM

## 2015-02-07 ENCOUNTER — Inpatient Hospital Stay (HOSPITAL_COMMUNITY): Payer: Self-pay | Admitting: Speech Pathology

## 2015-02-07 ENCOUNTER — Inpatient Hospital Stay (HOSPITAL_COMMUNITY): Payer: Medicare Other | Admitting: Occupational Therapy

## 2015-02-07 ENCOUNTER — Inpatient Hospital Stay (HOSPITAL_COMMUNITY): Payer: Medicare Other

## 2015-02-07 MED ORDER — CLOTRIMAZOLE 1 % EX CREA
TOPICAL_CREAM | Freq: Two times a day (BID) | CUTANEOUS | Status: DC
  Administered 2015-02-07 – 2015-02-15 (×16): via TOPICAL
  Administered 2015-02-15: 1 via TOPICAL
  Administered 2015-02-16: 09:00:00 via TOPICAL
  Filled 2015-02-07 (×2): qty 15

## 2015-02-07 NOTE — Progress Notes (Signed)
Subjective/Complaints: Still having diarrhea Patient appears bright and alert. Voice is not wet. Still nothing by mouth  ROS cannot obtain due to aphasia  Objective: Vital Signs: Blood pressure 131/67, pulse 88, temperature 98.2 F (36.8 C), temperature source Oral, resp. rate 18, weight 63.186 kg (139 lb 4.8 oz), SpO2 97 %. No results found. Results for orders placed or performed during the hospital encounter of 02/02/15 (from the past 72 hour(s))  Basic metabolic panel     Status: Abnormal   Collection Time: 02/06/15  5:14 AM  Result Value Ref Range   Sodium 137 135 - 145 mmol/L   Potassium 3.7 3.5 - 5.1 mmol/L   Chloride 99 (L) 101 - 111 mmol/L   CO2 33 (H) 22 - 32 mmol/L   Glucose, Bld 146 (H) 65 - 99 mg/dL   BUN 17 6 - 20 mg/dL   Creatinine, Ser 0.78 0.61 - 1.24 mg/dL   Calcium 8.2 (L) 8.9 - 10.3 mg/dL   GFR calc non Af Amer >60 >60 mL/min   GFR calc Af Amer >60 >60 mL/min    Comment: (NOTE) The eGFR has been calculated using the CKD EPI equation. This calculation has not been validated in all clinical situations. eGFR's persistently <60 mL/min signify possible Chronic Kidney Disease.    Anion gap 5 5 - 15  CBC     Status: Abnormal   Collection Time: 02/06/15  5:14 AM  Result Value Ref Range   WBC 15.6 (H) 4.0 - 10.5 K/uL   RBC 3.74 (L) 4.22 - 5.81 MIL/uL   Hemoglobin 11.8 (L) 13.0 - 17.0 g/dL   HCT 36.4 (L) 39.0 - 52.0 %   MCV 97.3 78.0 - 100.0 fL   MCH 31.6 26.0 - 34.0 pg   MCHC 32.4 30.0 - 36.0 g/dL   RDW 12.7 11.5 - 15.5 %   Platelets 234 150 - 400 K/uL     HEENT: normal Cardio: irregular and tachy Resp: CTA B/L and unlabored GI: BS positive and NT, ND Extremity:  No Edema Skin:   Intact Neuro: Abnormal Sensory cannot assess due to aphasia, Abnormal Motor 5/5 in BUE and BLE and Other receptive and exp language deficits Musc/Skel:  Normal and Other No pain ROM of UE or LE Gen NAD   Assessment/Plan: 1. Functional deficits secondary to Left MCA  which require 3+ hours per day of interdisciplinary therapy in a comprehensive inpatient rehab setting. Physiatrist is providing close team supervision and 24 hour management of active medical problems listed below. Physiatrist and rehab team continue to assess barriers to discharge/monitor patient progress toward functional and medical goals. Team conference today please see physician documentation under team conference tab, met with team face-to-face to discuss problems,progress, and goals. Formulized individual treatment plan based on medical history, underlying problem and comorbidities. FIM: Function - Bathing Position: Shower Body parts bathed by patient: Right arm, Left arm, Chest, Abdomen, Front perineal area, Buttocks, Right upper leg, Back, Left lower leg, Right lower leg, Left upper leg Assist Level: Touching or steadying assistance(Pt > 75%)  Function- Upper Body Dressing/Undressing What is the patient wearing?: Pull over shirt/dress Pull over shirt/dress - Perfomed by patient: Thread/unthread right sleeve, Thread/unthread left sleeve, Pull shirt over trunk Pull over shirt/dress - Perfomed by helper: Put head through opening Assist Level: Set up Set up : To obtain clothing/put away Function - Lower Body Dressing/Undressing What is the patient wearing?: Pants, Socks, Shoes, Underwear Position: Other (comment) (bedside chair) Underwear - Performed by  patient: Thread/unthread right underwear leg, Thread/unthread left underwear leg, Pull underwear up/down Pants- Performed by patient: Thread/unthread right pants leg, Thread/unthread left pants leg, Pull pants up/down, Fasten/unfasten pants Socks - Performed by patient: Don/doff right sock, Don/doff left sock Shoes - Performed by patient: Don/doff right shoe, Don/doff left shoe, Fasten right, Fasten left Assist for lower body dressing: Touching or steadying assistance (Pt > 75%)  Function - Toileting Toileting activity did not occur:  No continent bowel/bladder event  Function - Air cabin crew transfer activity did not occur: N/A  Function - Chair/bed transfer Chair/bed transfer method: Stand pivot Chair/bed transfer assist level: Touching or steadying assistance (Pt > 75%) Chair/bed transfer assistive device: Armrests Chair/bed transfer details: Manual facilitation for weight shifting  Function - Locomotion: Wheelchair Will patient use wheelchair at discharge?: No Type: Manual Max wheelchair distance: 150 Assist Level: Supervision or verbal cues Assist Level: Supervision or verbal cues Wheel 150 feet activity did not occur: Safety/medical concerns Assist Level: Supervision or verbal cues Turns around,maneuvers to table,bed, and toilet,negotiates 3% grade,maneuvers on rugs and over doorsills: No Function - Locomotion: Ambulation Assistive device: Hand held assist Max distance: 150 Assist level: Touching or steadying assistance (Pt > 75%) Assist level: Touching or steadying assistance (Pt > 75%) Walk 50 feet with 2 turns activity did not occur: Safety/medical concerns Assist level: Touching or steadying assistance (Pt > 75%) Walk 150 feet activity did not occur: Safety/medical concerns Assist level: Touching or steadying assistance (Pt > 75%) Walk 10 feet on uneven surfaces activity did not occur: Safety/medical concerns Assist level: 2 helpers  Function - Comprehension Comprehension: Auditory Comprehension assist level: Understands basic 25 - 49% of the time/ requires cueing 50 - 75% of the time  Function - Expression Expression: Verbal Expression assist level: Expresses basis less than 25% of the time/requires cueing >75% of the time.  Function - Social Interaction Social Interaction assist level: Interacts appropriately 75 - 89% of the time - Needs redirection for appropriate language or to initiate interaction.  Function - Problem Solving Problem solving assist level: Solves basic 25 - 49%  of the time - needs direction more than half the time to initiate, plan or complete simple activities  Function - Memory Memory assist level: Recognizes or recalls 50 - 74% of the time/requires cueing 25 - 49% of the time Patient normally able to recall (first 3 days only): None of the above  Medical Problem List and Plan: 1. 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  DVT Prophylaxis/Anticoagulation: Eliquis. Monitor CBC and BMET, small drop in hgb but no overt bleeding 3. Pain Management: Tylenol as needed             -no pain at present 4. Mood: BuSpar 15 mg twice a day, Klonopin 0.25 mg 3 times daily as needed             -may need further medication adjustment             -continued egosupport, environmental mod 5. Neuropsych: This patient is capable of making decisions on his own behalf. 6. Skin/Wound Care: Routine skin checks, diarrhea, patient stating wet, we will order an antifungal cream 7. Fluids/Electrolytes/Nutrition: Routine I&O follow-up chemistries             -adjust h2o flushes as indicated             -replace potassium, need to monitor closely given diarrhea 8. Dysphagia. Nothing by mouth. Nasogastric tube in place. Follow-up speech  therapy for advancement             -pt is high aspiration/pneumonia risk given dysphagia and PF, based on stroke location and lack of motor deficits would anticipate that swallowing function should improve 9. Healthcare associated pneumonia as well as history of pulmonary fibrosis. Continue Levaquin. Chronic oxygen as directed, was on 2L at rest at home and 4L with exercise             -aspiration precautions             -afebrile at present 10. Hyperlipidemia. Lopid/Pravachol 11. Atrial fibrillation:             -HR controlled at present, appropriately elevated with exercise, HR 91 monitor             -eliquis--has had some epistaxis no rucurrent episode, no blood in stools reported 12.  Diarrhea, stool for C diff is  neg, has Crohn's, unable to give pentasa CR due to NGT, consider rectal supp, schedule lomotil, likely combination between Crohns and TF +/- antibiotic associated add probiotic, will try to advance to by mouth but if this is not possible may need gastroenterology to advise whether Crohn's needs to be more aggressively treated 13.  Reviewed labs personally, elevated WBC likely residual from HCAP, Crohns may also affect, monitor for fever , BMET normal except elevated glucose which is TF related LOS (Days) 5 A FACE TO FACE EVALUATION WAS PERFORMED  Brian [name] 02/07/2015, 7:34 AM

## 2015-02-07 NOTE — Patient Care Conference (Signed)
Inpatient RehabilitationTeam Conference and Plan of Care Update Date: 02/07/2015   Time: 10:45 AM    Patient Name: Brian [name]      Medical Record Number: [id number]  Date of Birth: 1928-06-07 Sex: Male         Room/Bed: 4M07C/4M07C-01 Payor Info: Payor: MEDICARE / Plan: MEDICARE PART A AND B / Product Type: *No Product type* /    Admitting Diagnosis: L CVA  Admit Date/Time:  02/02/2015  6:13 PM Admission Comments: No comment available   Primary Diagnosis:  Left middle cerebral artery stroke [facility]) Principal Problem: Left middle cerebral artery stroke [facility])  Patient Active Problem List   Diagnosis Date Noted  . Left middle cerebral artery stroke ([place]) 02/02/2015  . Dysphagia, pharyngoesophageal phase 02/02/2015  . Aphasia due to recent cerebrovascular accident 02/02/2015  . Bacterial pneumonia   . Stroke with cerebral ischemia ([place])   . Stroke ([place]) 01/31/2015  . CVA (cerebral infarction) 01/31/2015  . Aphasia   . Hypokalemia   . CAP (community acquired pneumonia) 01/26/2015  . Pulmonary fibrosis ([place]) 01/26/2015  . PCP NOTES >>>>>>>>>>>> 12/14/2014  . Vitamin D deficiency 11/04/2013  . Vasomotor rhinitis 09/22/2013  . Diarrhea 07/08/2013  . Crohn's disease ([place]) 07/08/2013  . Postinflammatory pulmonary fibrosis ([place]) 05/26/2013  . Anxiety state 05/20/2013  . Sprain of shoulder, left 06/01/2012  . Personal history of colonic polyps 04/12/2012  . Pulmonary nodules 03/02/2011  . Cough 03/02/2011  . Long term (current) use of anticoagulants 02/21/2011  . Atrial fibrillation with controlled ventricular response ([place]) 02/17/2011  . Annual physical exam 12/23/2010  . Dyspnea 09/03/2010  . HEMORRHOIDS-INTERNAL 03/08/2010  . [place] INTESTINE 03/08/2010  . RECTAL BLEEDING 03/08/2010  . HEMATOCHEZIA 03/06/2010  . Essential hypertension 02/04/2010  . HYPERTROPHY PROSTATE W/O UR OBST & OTH LUTS 12/10/2009  . Osteopenia 12/07/2009  . PERIPHERAL NEUROPATHY  08/29/2009  . FECAL INCONTINENCE 01/29/2009  . OSTEOARTHRITIS, ANKLE, RIGHT 11/01/2008  . ERECTILE DYSFUNCTION 12/02/2007  . TREMOR 12/02/2007  . CARCINOMA, SKIN, SQUAMOUS CELL 09/16/2006  . B12 DEFICIENCY 08/27/2006  . HYPERTRIGLYCERIDEMIA 08/27/2006    Expected Discharge Date: Expected Discharge Date: 02/14/15  Team Members Present: Physician leading conference: Dr. Alysia Penna Social Worker Present: Ovidio Kin, LCSW Nurse Present: Dorien Chihuahua, RN PT Present: Georjean Mode, PT OT Present: Clyda Greener, OT SLP Present: Windell Moulding, SLP PPS Coordinator present : Daiva Nakayama, RN, CRRN     Current Status/Progress Goal Weekly Team Focus  Medical   diarrhea, NPO, Crohn's  Home with family, po intake  manage skin, advance diet   Bowel/Bladder   incontinent of bowe- Loose bowels. LBM 11-15. Pt continent of bladder with assistance needed with urinal  manage bowel and bladder mod assist  timed tolieting regimen with bowels. prevent incont. episodes of bowel   Swallow/Nutrition/ Hydration   NPO with NG tube  min assist   trials of ice chips, monitoring s/s of improvement at bedside to determine readiness for repeat  objective swallow study   ADL's   Min assist for transfers in and out of the shower as well as the toilet.  Pt min guard to supervison for all bathing and dressing.  Receptive and expressive deficits still present with pt understanding approximately 50-75% of instructional commands.  Still needing 4Ls O2 with activity.  Sats decreasing to 87 to 88 with activity on 4Ls..  supervision overall  selfcare re-training, transfer training, balance re-training, DME education, cognitive re-training, pt/family education   Mobility  Min guard transfers, gait with RW and HHA, desaturates after 2-3 min standing activity on 4L O2.   Supervision overall   Dynamic standing balance, gait training, endurance, safety awareness   Communication   Max-total assist for verbal expression  due to global aphasia and apraxia   mod assist   verbal expression of needs/wants, awareness of verbal errors, multimodal communication   Safety/Cognition/ Behavioral Observations  poor awareness of deficits, difficult to fully assess due to aphasia  mod assist   safety awareness, awareness of deficits, use of call bell    Pain   no complaints of pain         Skin   MASD, yeast like reddened area to groin/peri area. EPBC and MGP applied. scattered brusing noted.   free of skin breakdown min assist while on rehab unit  assess skin q shift, contiune management of peri area.      *See Care Plan and progress notes for long and short-term goals.  Barriers to Discharge: see above    Possible Resolutions to Barriers:  discuss swallow with SLP    Discharge Planning/Teaching Needs:  Home with wife who can provide light physical assist, she has been here to observe pt in therapies.      Team Discussion:  Goals supervision level-main issue is swallowing. MBS end of week or beginning of next week. Unable to take Crohns' med due to can't go through pt's NG tube. Expressive/receptive aphasia. Bal;ance issues needs cues, family here daily to observe him in therapies.  Revisions to Treatment Plan:  None   Continued Need for Acute Rehabilitation Level of Care: The patient requires daily medical management by a physician with specialized training in physical medicine and rehabilitation for the following conditions: Daily direction of a multidisciplinary physical rehabilitation program to ensure safe treatment while eliciting the highest outcome that is of practical value to the patient.: Yes Daily medical management of patient stability for increased activity during participation in an intensive rehabilitation regime.: Yes Daily analysis of laboratory values and/or radiology reports with any subsequent need for medication adjustment of medical intervention for : Other;Neurological problems  Aleem Elza,  Gardiner Rhyme 02/07/2015, 12:51 PM

## 2015-02-07 NOTE — Progress Notes (Signed)
Social Work Elease Hashimoto, LCSW Social Worker Signed Physical Medicine and Rehabilitation Patient Care Conference 02/07/2015 12:51 PM    Expand All Collapse All   Inpatient RehabilitationTeam Conference and Plan of Care Update Date: 02/07/2015   Time: 10:45 AM     Patient Name: Brian [name]       Medical Record Number: [id number]  Date of Birth: Jan 04, 1929 Sex: Male         Room/Bed: 4M07C/4M07C-01 Payor Info: Payor: MEDICARE / Plan: MEDICARE PART A AND B / Product Type: *No Product type* /    Admitting Diagnosis: L CVA   Admit Date/Time:  02/02/2015  6:13 PM Admission Comments: No comment available   Primary Diagnosis:  Left middle cerebral artery stroke [facility]) Principal Problem: Left middle cerebral artery stroke [facility])    Patient Active Problem List     Diagnosis  Date Noted   .  Left middle cerebral artery stroke ([place])  02/02/2015   .  Dysphagia, pharyngoesophageal phase  02/02/2015   .  Aphasia due to recent cerebrovascular accident  02/02/2015   .  Bacterial pneumonia     .  Stroke with cerebral ischemia ([place])     .  Stroke ([place])  01/31/2015   .  CVA (cerebral infarction)  01/31/2015   .  Aphasia     .  Hypokalemia     .  CAP (community acquired pneumonia)  01/26/2015   .  Pulmonary fibrosis ([place])  01/26/2015   .  PCP NOTES >>>>>>>>>>>>  12/14/2014   .  Vitamin D deficiency  11/04/2013   .  Vasomotor rhinitis  09/22/2013   .  Diarrhea  07/08/2013   .  Crohn's disease ([place])  07/08/2013   .  Postinflammatory pulmonary fibrosis ([place])  05/26/2013   .  Anxiety state  05/20/2013   .  Sprain of shoulder, left  06/01/2012   .  Personal history of colonic polyps  04/12/2012   .  Pulmonary nodules  03/02/2011   .  Cough  03/02/2011   .  Long term (current) use of anticoagulants  02/21/2011   .  Atrial fibrillation with controlled ventricular response ([place])  02/17/2011   .  Annual physical exam  12/23/2010   .  Dyspnea  09/03/2010   .  HEMORRHOIDS-INTERNAL  03/08/2010    .  [place] INTESTINE  03/08/2010   .  RECTAL BLEEDING  03/08/2010   .  HEMATOCHEZIA  03/06/2010   .  Essential hypertension  02/04/2010   .  HYPERTROPHY PROSTATE W/O UR OBST & OTH LUTS  12/10/2009   .  Osteopenia  12/07/2009   .  PERIPHERAL NEUROPATHY  08/29/2009   .  FECAL INCONTINENCE  01/29/2009   .  OSTEOARTHRITIS, ANKLE, RIGHT  11/01/2008   .  ERECTILE DYSFUNCTION  12/02/2007   .  TREMOR  12/02/2007   .  CARCINOMA, SKIN, SQUAMOUS CELL  09/16/2006   .  B12 DEFICIENCY  08/27/2006   .  HYPERTRIGLYCERIDEMIA  08/27/2006     Expected Discharge Date: Expected Discharge Date: 02/14/15  Team Members Present: Physician leading conference: Dr. Alysia Penna Social Worker Present: Ovidio Kin, LCSW Nurse Present: Dorien Chihuahua, RN PT Present: Georjean Mode, PT OT Present: Clyda Greener, OT SLP Present: Windell Moulding, SLP PPS Coordinator present : Daiva Nakayama, RN, CRRN        Current Status/Progress  Goal  Weekly Team Focus   Medical     diarrhea, NPO, Crohn's  Home with family, po intake  manage skin, advance diet   Bowel/Bladder     incontinent of bowe- Loose bowels. LBM 11-15. Pt continent of bladder with assistance needed with urinal  manage bowel and bladder mod assist   timed tolieting regimen with bowels. prevent incont. episodes of bowel   Swallow/Nutrition/ Hydration     NPO with NG tube  min assist   trials of ice chips, monitoring s/s of improvement at bedside to determine readiness for repeat  objective swallow study    ADL's     Min assist for transfers in and out of the shower as well as the toilet.  Pt min guard to supervison for all bathing and dressing.  Receptive and expressive deficits still present with pt understanding approximately 50-75% of instructional commands.  Still needing 4Ls O2 with activity.  Sats decreasing to 87 to 88 with activity on 4Ls..   supervision overall  selfcare re-training, transfer training, balance re-training, DME  education, cognitive re-training, pt/family education    Mobility     Min guard transfers, gait with RW and HHA, desaturates after 2-3 min standing activity on 4L O2.   Supervision overall   Dynamic standing balance, gait training, endurance, safety awareness   Communication     Max-total assist for verbal expression due to global aphasia and apraxia   mod assist   verbal expression of needs/wants, awareness of verbal errors, multimodal communication   Safety/Cognition/ Behavioral Observations    poor awareness of deficits, difficult to fully assess due to aphasia  mod assist   safety awareness, awareness of deficits, use of call bell     Pain     no complaints of pain         Skin     MASD, yeast like reddened area to groin/peri area. EPBC and MGP applied. scattered brusing noted.   free of skin breakdown min assist while on rehab unit  assess skin q shift, contiune management of peri area.       *See Care Plan and progress notes for long and short-term goals.    Barriers to Discharge:  see above     Possible Resolutions to Barriers:   discuss swallow with SLP     Discharge Planning/Teaching Needs:   Home with wife who can provide light physical assist, she has been here to observe pt in therapies.       Team Discussion:    Goals supervision level-main issue is swallowing. MBS end of week or beginning of next week. Unable to take Crohns' med due to can't go through pt's NG tube. Expressive/receptive aphasia. Bal;ance issues needs cues, family here daily to observe him in therapies.   Revisions to Treatment Plan:    None    Continued Need for Acute Rehabilitation Level of Care: The patient requires daily medical management by a physician with specialized training in physical medicine and rehabilitation for the following conditions: Daily direction of a multidisciplinary physical rehabilitation program to ensure safe treatment while eliciting the highest outcome that is of practical  value to the patient.: Yes Daily medical management of patient stability for increased activity during participation in an intensive rehabilitation regime.: Yes Daily analysis of laboratory values and/or radiology reports with any subsequent need for medication adjustment of medical intervention for : Other;Neurological problems  Elease Hashimoto 02/07/2015, 12:51 PM                  Patient ID: Brian [name], male  DOB: [birth year], 81 y.o.   MRN: [id number]

## 2015-02-07 NOTE — Plan of Care (Signed)
Problem: RH Comprehension Communication Goal: LTG Patient will comprehend basic/complex auditory (SLP) LTG: Patient will comprehend basic/complex auditory information with cues (SLP).  Goal upgraded  Problem: RH Expression Communication Goal: LTG Patient will express needs/wants via multi-modal(SLP) LTG: Patient will express needs/wants via multi-modal communication (gestures/written, etc) with cues (SLP)  Goal upgraded

## 2015-02-07 NOTE — Progress Notes (Signed)
Social Work Patient ID: Brian [name], male   DOB: 1929-01-08, 81 y.o.   MRN: [id number] Met with pt, daughter and pt's wife to discuss team conference goals-supervision level and target discharge date of 11/23. Swallow test either end of this week or beginning of next week to assess His ability to begin a diet. Family reports he is exhausted today after am therapies, wanting to lie down. His main issue is swallowing and communication. Family here daily to observe him in therapies and see his progress. All are hopeful he will pass his swallow and can be placed back on his Crohn's medicine since it can not be put down his NG tube. Work on discharge plans and toward discharge next week.

## 2015-02-07 NOTE — Progress Notes (Signed)
Occupational Therapy Session Note  Patient Details  Name: Brian [name] MRN: [id number] Date of Birth: 11-02-1928  Today's Date: 02/07/2015 OT Individual Time: 1000-1028 OT Individual Time Calculation (min): 28 min    Short Term Goals: Week 1:  OT Short Term Goal 1 (Week 1): Pt will bathe with S. OT Short Term Goal 2 (Week 1): Pt will be able to step in and out of tub and/or walk in shower with S. OT Short Term Goal 3 (Week 1): Pt will demonstrate improved activity tolerance to stand for 5 minutes without O2 sats dropping below 92% on oxygen supplementation.  Skilled Therapeutic Interventions/Progress Updates:    [place] 1:  Pt completed grooming task of shaving at the sink in standing.  Pt needed min assist for thoroughness but when given verbal cue and shown razor he was able to plug it in and figure out how to turn it on with a slight increase in time.  Pt able to stand for 10 mins with O2 sats decreasing to 85% on 4Ls and HR increasing into the low 120's.  Pt still with decreased ability to follow instructional commands related to MMT during session, but strength 5/5 grossly.    Session 2:  (1100-1156) 56 mins  Pt completed shower and dressing during session.  Max demonstrational cueing for pt to gather his clothing and place on the bed before attempting shower.  He was able to ambulate with min assist.  Pt stood to remove LB clothing while stabilizing and UE up on the shelf in the bathroom.  He attempted to hold onto oxygen tank carrier when lifting the RLE to remove clothing.  Therapist attempted to re-direct him that this was not safe, however he did not understand and continued removing clothing.  Min assist needed to maintain balance.  He was able to complete bathing with min guard assist.  Mod demonstrational cueing to locate soap on the right side of midline, but pt did initiate asking for it when he did not see it.  Dressing performed at EOB with clothing placed on right side of pt.  He  needed mod demonstrational cueing as well to locate the clothing but once he did he was able to donn with only min guard assist for standing to pull articles over his hips.  Pt's O2 sats decreased to 85% on 4Ls while working so increased to 6Ls with sats increasing to 90-91 percent.  Pt left in wheelchair with wife at end of session.  She was able to observe throughout sessions as well.    Therapy Documentation Precautions:  Precautions Precautions: Fall Precaution Comments: NPO; aphasic Restrictions Weight Bearing Restrictions: No  Pain: Pain Assessment Pain Assessment: No/denies pain Pain Score: 0-No pain ADL: ADL ADL Comments: Refer to functional navigator  See Function Navigator for Current Functional Status.   Therapy/Group: Individual Therapy  Selina Tapper 02/07/2015, 10:34 AM

## 2015-02-07 NOTE — Progress Notes (Addendum)
Physical Therapy Session Note  Patient Details  Name: Brian [name] MRN: [id number] Date of Birth: 07-10-1928  Today's Date: 02/07/2015 PT Individual Time: 0800-0900; 1300-1330 PT Individual Time Calculation (min): 60 min , 30 min  Short Term Goals: Week 1:  PT Short Term Goal 1 (Week 1): STGs = LTGs due to ELOS  Skilled Therapeutic Interventions/Progress Updates:   Supervision bed mobility for donning pants in bed.  PT assisted pt in tying shoes.   Gait x 70' with pt pushing IV pole, on 4L O2 via [place].  O2 sats 94%, HR 119 in sitting, 94%, 145 HR immediately after gait, then dropping to 85% and rising again above 90% in 2 min with visual cues for deep breathing. Gait on uneven terrain required max assist due to LOB backwards, and mod assist while retrieving item from floor due to LOB backwards in standing.  Pt has tight hamstrings bil.    neuromuscular re-education via demo, manual cues, set up for self stretching bil hamstrings, x 30 seconds x 3 R/L.  Gait to return to room.  Pt left sitting in w/c with quick release belt on and all needs within reach.  tx 2:  neuromuscular re-education via manual cues, demo, VCs for [place] in standing: mini squats, calf raises with L or RUE reaching up, toe raises, 10 x 2 each.  Pt tends to lose balance backwards with delayed and inadequate balance strategies.  Gait in room with hand hold assist forward/backward/sideways.  Pt needs mod assist at times due to LOB backwards. O2 sats 90/91% on 6L O2 via [place]. NT informed.  Pt stated "I have pooped!"; pt assisted to bed, and NT entered room to change pt. Therapy Documentation Precautions:  Precautions Precautions: Fall Precaution Comments: NPO; aphasic Restrictions Weight Bearing Restrictions: No   Pain: Pain Assessment Pain Assessment: No/denies pain Pain Score: 0-No pain           See Function Navigator for Current Functional Status.   Therapy/Group: Individual  Therapy  [name] 02/07/2015, 10:49 AM

## 2015-02-07 NOTE — Progress Notes (Signed)
Speech Language Pathology Daily Session Note  Patient Details  Name: Brian [name] MRN: [id number] Date of Birth: 02/16/1929  Today's Date: 02/07/2015 SLP Individual Time: 1430-1500 SLP Individual Time Calculation (min): 30 min  Short Term Goals: Week 1: SLP Short Term Goal 1 (Week 1): Pt will consume therapeutic ice chip trials with minimal overt signs of aspiration across two therapy sessions to determine readiness for repeat MBS SLP Short Term Goal 2 (Week 1): Pt will utilize multimodal communication methods to communicate basic wants/needs 75% of the time with Max cues SLP Short Term Goal 3 (Week 1): Pt will follow one-step commands with 75% accuracy with Max cues within basic, familiar tasks  Skilled Therapeutic Interventions:  Pt was seen for skilled ST targeting dysphagia goals.  SLP facilitated the session with trials of POs to continue working towards diet initiation.  Pt consumed trials of ice chips and thin liquids via teaspoon with initial weak, delayed cough x3, which resolved with consecutive trials.  Despite resolution of coughing with consecutive trials, pt continued to demonstrate wet vocal quality and multiple swallows which is concerning for compromised airway protection.  No overt s/s of aspiration were evident with honey thick liquids and pt's vocal quality remained clear with the thicker viscosity; however, pt did demonstrate multiple swallows.  Recommend continued trials of POs with SLP only to continue working towards readiness for repeat objective swallow study.  Continue per current plan of care.    Function:  Eating Eating   Modified Consistency Diet: Yes (NPO with NG tube) Eating Assist Level: Supervision or verbal cues (with trials )           Cognition Comprehension Comprehension assist level: Understands basic 25 - 49% of the time/ requires cueing 50 - 75% of the time  Expression   Expression assist level: Expresses basic 25 - 49% of the time/requires  cueing 50 - 75% of the time. Uses single words/gestures.  Social Interaction Social Interaction assist level: Interacts appropriately 75 - 89% of the time - Needs redirection for appropriate language or to initiate interaction.  Problem Solving Problem solving assist level: Solves basic 50 - 74% of the time/requires cueing 25 - 49% of the time  Memory Memory assist level: Recognizes or recalls 25 - 49% of the time/requires cueing 50 - 75% of the time    Pain Pain Assessment Pain Assessment: No/denies pain  Therapy/Group: Individual Therapy  Ramces Shomaker, Selinda Orion 02/07/2015, 3:57 PM

## 2015-02-07 NOTE — Progress Notes (Signed)
Upon entering, noticed some bloody drainage from nose.  Patient is on continuous oxygen at 4L.  Applied humidifier to oxygen via [place].  Will continue to monitor.  Brita Romp, RN

## 2015-02-08 ENCOUNTER — Inpatient Hospital Stay (HOSPITAL_COMMUNITY): Payer: Medicare Other | Admitting: Occupational Therapy

## 2015-02-08 ENCOUNTER — Inpatient Hospital Stay (HOSPITAL_COMMUNITY): Payer: Medicare Other | Admitting: Physical Therapy

## 2015-02-08 ENCOUNTER — Inpatient Hospital Stay (HOSPITAL_COMMUNITY): Payer: Medicare Other | Admitting: Speech Pathology

## 2015-02-08 MED ORDER — CHOLESTYRAMINE 4 G PO PACK
4.0000 g | PACK | Freq: Two times a day (BID) | ORAL | Status: DC
  Administered 2015-02-08 – 2015-02-11 (×8): 4 g
  Filled 2015-02-08 (×12): qty 1

## 2015-02-08 NOTE — Progress Notes (Signed)
Physical Therapy Session Note  Patient Details  Name: Brian [name] MRN: [id number] Date of Birth: [birth year]  Today's Date: 02/08/2015 PT Individual Time: 1500-1545 PT Individual Time Calculation (min): 45 min   Short Term Goals: Week 1:  PT Short Term Goal 1 (Week 1): STGs = LTGs due to ELOS  Skilled Therapeutic Interventions/Progress Updates:   Patient resting in bed, agreeable to therapy. Initiated hands-on family training with wife providing hand held assist for gait without device 2 x 160 ft, wife providing appropriate cues for upright posture and forward gaze without prompting from therapist. After ambulation, Sp02 85% on 4L 02 via [place] steadily increased to >90% on 4 L 02 with pursed lip breathing and seated rest break. In gym, demonstrated floor transfer and when patient stood to initiate floor transfer he reported having a bowel movement. Returned to room and patient performed toilet transfer with steady assist and maintained standing for total A hygiene and required min multimodal cues to locate soap and paper towels on R side of sink with hand hygiene. Seated edge of bed, performed B hamstring stretches 2 x 30 sec, patient with decreased tolerance to stretch noted by posterior lean due to decreased muscle extensibility/tightness. Therex for strengthening: SAQ x 10 each LE, standing heel raises x 15 and standing marching x 15 with B HHA from therapist. Patient reported fatigue and left sitting edge of bed with wife present.   Therapy Documentation Precautions:  Precautions Precautions: Fall Precaution Comments: NPO; aphasic Restrictions Weight Bearing Restrictions: No Pain: Pain Assessment Pain Assessment: No/denies pain   See Function Navigator for Current Functional Status.   Therapy/Group: Individual Therapy  Laretta Alstrom 02/08/2015, 5:06 PM

## 2015-02-08 NOTE — Progress Notes (Signed)
Nutrition Follow-up  DOCUMENTATION CODES:   Non-severe (moderate) malnutrition in context of chronic illness  INTERVENTION:  Continue Jevity 1.2 via Cortrak feeding tube at goal rate of 85 ml/hr x 20 hours (holding 4 hours for therapy) to provide 2040 kcal (100% of needs), 94 grams of protein, and 1377 ml of free water.   Provide free water flushes of 150 ml TID per tube.   RD to continue to monitor.   NUTRITION DIAGNOSIS:   Malnutrition related to chronic illness as evidenced by moderate depletions of muscle mass, moderate depletion of body fat; ongoing  GOAL:   Patient will meet greater than or equal to 90% of their needs;   MONITOR:   TF tolerance, Skin, Weight trends, Labs, I & O's  REASON FOR ASSESSMENT:   Consult Enteral/tube feeding initiation and management  ASSESSMENT:   81 y.o. right handed male with history of pulmonary fibrosis with home oxygen 2 L with exertion, atrial fibrillation on chronic Coumadin.  Presented 01/31/2015 with aphasia and right-sided weakness with facial droop. 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  Pt has been tolerating his tube feeds at goal rate per RN. RD to continue with current orders. Of note, pt with large loose stools. RD will continue to monitor.   Diet Order:  Diet NPO time specified  Skin:  Reviewed, no issues  Last BM:  11/17  Height:   Ht Readings from Last 1 Encounters:  01/26/15 5' 11.75" (1.822 m)    Weight:   Wt Readings from Last 1 Encounters:  02/08/15 131 lb 1.6 oz (59.467 kg)    Ideal Body Weight:  80 kg  BMI:  Body mass index is 17.91 kg/(m^2).  Estimated Nutritional Needs:   Kcal:  1900-2100  Protein:  90-105 grams  Fluid:  1.9 - 2.1 L/day  EDUCATION NEEDS:   No education needs identified at this time  Corrin Parker, MS, RD, LDN Pager # [phone] After hours/ weekend pager # [phone]

## 2015-02-08 NOTE — Progress Notes (Signed)
Subjective/Complaints: "pretty good" Per RN still with large inc loose stools   ROS cannot obtain due to aphasia  Objective: Vital Signs: Blood pressure 136/97, pulse 89, temperature 97.6 F (36.4 C), temperature source Oral, resp. rate 16, weight 59.467 kg (131 lb 1.6 oz), SpO2 96 %. No results found. Results for orders placed or performed during the hospital encounter of 02/02/15 (from the past 72 hour(s))  Basic metabolic panel     Status: Abnormal   Collection Time: 02/06/15  5:14 AM  Result Value Ref Range   Sodium 137 135 - 145 mmol/L   Potassium 3.7 3.5 - 5.1 mmol/L   Chloride 99 (L) 101 - 111 mmol/L   CO2 33 (H) 22 - 32 mmol/L   Glucose, Bld 146 (H) 65 - 99 mg/dL   BUN 17 6 - 20 mg/dL   Creatinine, Ser 0.78 0.61 - 1.24 mg/dL   Calcium 8.2 (L) 8.9 - 10.3 mg/dL   GFR calc non Af Amer >60 >60 mL/min   GFR calc Af Amer >60 >60 mL/min    Comment: (NOTE) The eGFR has been calculated using the CKD EPI equation. This calculation has not been validated in all clinical situations. eGFR's persistently <60 mL/min signify possible Chronic Kidney Disease.    Anion gap 5 5 - 15  CBC     Status: Abnormal   Collection Time: 02/06/15  5:14 AM  Result Value Ref Range   WBC 15.6 (H) 4.0 - 10.5 K/uL   RBC 3.74 (L) 4.22 - 5.81 MIL/uL   Hemoglobin 11.8 (L) 13.0 - 17.0 g/dL   HCT 36.4 (L) 39.0 - 52.0 %   MCV 97.3 78.0 - 100.0 fL   MCH 31.6 26.0 - 34.0 pg   MCHC 32.4 30.0 - 36.0 g/dL   RDW 12.7 11.5 - 15.5 %   Platelets 234 150 - 400 K/uL     HEENT: normal Cardio: irregular and tachy Resp: CTA B/L and unlabored GI: BS positive and NT, ND Extremity:  No Edema Skin:   Intact Neuro: Abnormal Sensory cannot assess due to aphasia, Abnormal Motor 5/5 in BUE and BLE and Other receptive and exp language deficits Musc/Skel:  Normal and Other No pain ROM of UE or LE Gen NAD   Assessment/Plan: 1. Functional deficits secondary to Left MCA which require 3+ hours per day of  interdisciplinary therapy in a comprehensive inpatient rehab setting. Physiatrist is providing close team supervision and 24 hour management of active medical problems listed below. Physiatrist and rehab team continue to assess barriers to discharge/monitor patient progress toward functional and medical goals.  FIM: Function - Bathing Position: Shower Body parts bathed by patient: Right arm, Left arm, Chest, Abdomen, Front perineal area, Buttocks, Right upper leg, Back, Left lower leg, Right lower leg, Left upper leg Assist Level: Touching or steadying assistance(Pt > 75%)  Function- Upper Body Dressing/Undressing What is the patient wearing?: Pull over shirt/dress Pull over shirt/dress - Perfomed by patient: Thread/unthread right sleeve, Thread/unthread left sleeve, Pull shirt over trunk Pull over shirt/dress - Perfomed by helper: Put head through opening Assist Level: Supervision or verbal cues Set up : To obtain clothing/put away Function - Lower Body Dressing/Undressing What is the patient wearing?: Pants, Socks, Shoes, Underwear Position: Other (comment) (bedside chair) Underwear - Performed by patient: Thread/unthread right underwear leg, Thread/unthread left underwear leg, Pull underwear up/down Pants- Performed by patient: Thread/unthread right pants leg, Thread/unthread left pants leg, Pull pants up/down, Fasten/unfasten pants Socks - Performed by patient:  Don/doff right sock, Don/doff left sock Shoes - Performed by patient: Don/doff right shoe, Don/doff left shoe, Fasten right, Fasten left Assist for lower body dressing: Touching or steadying assistance (Pt > 75%)  Function - Toileting Toileting activity did not occur: No continent bowel/bladder event Toileting steps completed by patient: Adjust clothing prior to toileting, Performs perineal hygiene, Adjust clothing after toileting Assist level: Touching or steadying assistance (Pt.75%)  Function - Toilet Transfers Toilet  transfer activity did not occur: N/A Assist level to toilet: Touching or steadying assistance (Pt > 75%) Assist level from toilet: Touching or steadying assistance (Pt > 75%)  Function - Chair/bed transfer Chair/bed transfer method: Stand pivot Chair/bed transfer assist level: Moderate assist (Pt 50 - 74%/lift or lower) Chair/bed transfer assistive device: Armrests Chair/bed transfer details: Manual facilitation for weight shifting  Function - Locomotion: Wheelchair Will patient use wheelchair at discharge?: No Type: Manual Max wheelchair distance: 150 Assist Level: Supervision or verbal cues Assist Level: Supervision or verbal cues Wheel 150 feet activity did not occur: Safety/medical concerns Assist Level: Supervision or verbal cues Turns around,maneuvers to table,bed, and toilet,negotiates 3% grade,maneuvers on rugs and over doorsills: No Function - Locomotion: Ambulation Assistive device: Hand held assist Max distance: 10 Assist level: Touching or steadying assistance (Pt > 75%) Assist level: Touching or steadying assistance (Pt > 75%) Walk 50 feet with 2 turns activity did not occur: Safety/medical concerns Assist level: Touching or steadying assistance (Pt > 75%) Walk 150 feet activity did not occur: Safety/medical concerns Assist level: Touching or steadying assistance (Pt > 75%) Walk 10 feet on uneven surfaces activity did not occur: Safety/medical concerns Assist level: Maximal assist (Pt 25 - 49%) (LOB backwards)  Function - Comprehension Comprehension: Auditory Comprehension assist level: Understands basic 75 - 89% of the time/ requires cueing 10 - 24% of the time  Function - Expression Expression: Verbal Expression assist level: Expresses basic 25 - 49% of the time/requires cueing 50 - 75% of the time. Uses single words/gestures.  Function - Social Interaction Social Interaction assist level: Interacts appropriately 75 - 89% of the time - Needs redirection for  appropriate language or to initiate interaction.  Function - Problem Solving Problem solving assist level: Solves basic 50 - 74% of the time/requires cueing 25 - 49% of the time  Function - Memory Memory assist level: Recognizes or recalls 25 - 49% of the time/requires cueing 50 - 75% of the time Patient normally able to recall (first 3 days only): None of the above  Medical Problem List and Plan: 1. 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  DVT Prophylaxis/Anticoagulation: Eliquis. Monitor CBC and BMET, small drop in hgb but no overt bleeding 3. Pain Management: Tylenol as needed             -no pain at present 4. Mood: BuSpar 15 mg twice a day, Klonopin 0.25 mg 3 times daily as needed             -may need further medication adjustment             -continued egosupport, environmental mod 5. Neuropsych: This patient is capable of making decisions on his own behalf. 6. Skin/Wound Care: Routine skin checks, diarrhea, patient stating wet, we will order an antifungal cream 7. Fluids/Electrolytes/Nutrition: Routine I&O follow-up chemistries             -adjust h2o flushes as indicated             -replace potassium, need  to monitor closely given diarrhea 8. Dysphagia. Nothing by mouth. Nasogastric tube in place. Follow-up speech therapy for advancement             -pt is high aspiration/pneumonia risk given dysphagia and PF, based on stroke location and lack of motor deficits would anticipate that swallowing function should improve 9. Healthcare associated pneumonia as well as history of pulmonary fibrosis. Continue Levaquin. Chronic oxygen as directed, was on 2L at rest at home and 4L with exercise             -aspiration precautions             -afebrile at present 10. Hyperlipidemia. Lopid/Pravachol 11. Atrial fibrillation:             -HR controlled at present, appropriately elevated with exercise, HR 91 monitor             -eliquis--has had some epistaxis no  rucurrent episode, no blood in stools reported 12.  Diarrhea, stool for C diff is neg, has Crohn's, unable to give pentasa CR due to NGT, consider rectal supp, schedule lomotil, likely combination between Crohns and TF +/- antibiotic associated add probiotic, will try to advance to by mouth but if this is not possible may need gastroenterology to advise whether Crohn's needs to be more aggressively treated, no improvement with Lomotil, will trial Questran 13.  Reviewed labs personally, elevated WBC likely residual from HCAP, Crohns may also affect, monitor for fever , BMET normal except elevated glucose which is TF related LOS (Days) 6 A FACE TO FACE EVALUATION WAS PERFORMED  Kourtney Terriquez E 02/08/2015, 7:30 AM

## 2015-02-08 NOTE — Progress Notes (Signed)
Occupational Therapy Session Note  Patient Details  Name: Brian [name] MRN: [id number] Date of Birth: 27-Mar-1928  Today's Date: 02/08/2015 OT Individual Time: [id number] OT Individual Time Calculation (min): 75 min    Short Term Goals: Week 1:  OT Short Term Goal 1 (Week 1): Pt will bathe with S. OT Short Term Goal 2 (Week 1): Pt will be able to step in and out of tub and/or walk in shower with S. OT Short Term Goal 3 (Week 1): Pt will demonstrate improved activity tolerance to stand for 5 minutes without O2 sats dropping below 92% on oxygen supplementation.  Skilled Therapeutic Interventions/Progress Updates:    Pt performed bathing and dressing to begin session.  He was able to gather clothing needed with mod instructional cueing while ambulating around with min assist and no assistive device.  He was able to stand for urination after removing clothing with min guard assist.  Shower completed sit to stand with min guard assist and min instructional cueing for sequencing.  Pt ambulated out to the EOB for dressing and was able to locate all of his clothing placed on the right side of midline with no more than min instructional cueing.  Oxygen sats monitored during session with drop to 78% on 4Ls during post ambulation in the room.  Pt increased to 6Ls nasal cannula as well.  Oxygen sats re-checked on 6Ls with activity at 84%.  Took approximately 2 mins of purse lip breathing to bring back up to 90 %.  Had pt ambulate to the therapy gym for last part of session.  Min assist needed with pt walking at a very slow rate of speed.  Once in the gym, worked on dynamic standing balance, having pt retrieve paper towels that were wadded into the floor.  Instructed pt before starting that there were 10 papertowels he needed to pickup.  He was able to count them verbally as he picked them up.  Rest break in sitting needed after finding five of them secondary to fatigue.  He was able to locate the other 5 with  only min instructional cueing, including ones on the right side of midline.    Therapy Documentation Precautions:  Precautions Precautions: Fall Precaution Comments: NPO; aphasic Restrictions Weight Bearing Restrictions: No  Pain: Pain Assessment Pain Assessment: No/denies pain Pain Score: 0-No pain ADL: ADL ADL Comments: Refer to functional navigator  See Function Navigator for Current Functional Status.   Therapy/Group: Individual Therapy  Tyronza Happe OTR/L 02/08/2015, 12:44 PM

## 2015-02-08 NOTE — Progress Notes (Signed)
Speech Language Pathology Daily Session Note  Patient Details  Name: Brian [name] MRN: [id number] Date of Birth: 1928-08-31  Today's Date: 02/08/2015 SLP Individual Time: 1000-1100 SLP Individual Time Calculation (min): 60 min  Short Term Goals: Week 1: SLP Short Term Goal 1 (Week 1): Pt will consume therapeutic ice chip trials with minimal overt signs of aspiration across two therapy sessions to determine readiness for repeat MBS SLP Short Term Goal 2 (Week 1): Pt will utilize multimodal communication methods to communicate basic wants/needs 75% of the time with Max cues SLP Short Term Goal 3 (Week 1): Pt will follow one-step commands with 75% accuracy with Max cues within basic, familiar tasks  Skilled Therapeutic Interventions:  Pt was seen for skilled ST targeting goals for communication and dysphagia.  SLP facilitated the session with trials of purees and honey thick liquids via teaspoon to continue ongoing assessment of readiness for repeat objective swallow study.  Pt demonstrated no overt s/s of aspiration with honey thick liquids via teaspoon or purees.  Pt demonstrated multiple swallows but vocal quality remained clear throughout all trials.  Recommend a repeat MBS tomorrow to determine readiness for initiation of PO diet.  Pt named black and white line drawings of familiar objects with words presented beneath pictures with mod-max assist phonemic and semantic cues for ~75% accuracy.  Pt demonstrated improving awareness of verbal errors within task as evidenced by his attempts to correct perseveration with repetition of trials.  Pt was able to copy letters from written model for >80% accuracy.  Pt was returned to bed at the end of today's therapy session and was left with call bell within reach and family at bedside.  Continue per current plan of care.    Function:  Eating Eating   Modified Consistency Diet: Yes (NPO with NG tube ) Eating Assist Level: Supervision or verbal cues  (with trials )           Cognition Comprehension Comprehension assist level: Understands basic 75 - 89% of the time/ requires cueing 10 - 24% of the time  Expression   Expression assist level: Expresses basic 25 - 49% of the time/requires cueing 50 - 75% of the time. Uses single words/gestures.  Social Interaction Social Interaction assist level: Interacts appropriately 75 - 89% of the time - Needs redirection for appropriate language or to initiate interaction.  Problem Solving Problem solving assist level: Solves basic 50 - 74% of the time/requires cueing 25 - 49% of the time  Memory Memory assist level: Recognizes or recalls 25 - 49% of the time/requires cueing 50 - 75% of the time    Pain Pain Assessment Pain Assessment: No/denies pain  Therapy/Group: Individual Therapy  Lorriane Dehart, Selinda Orion 02/08/2015, 3:52 PM

## 2015-02-09 ENCOUNTER — Inpatient Hospital Stay (HOSPITAL_COMMUNITY): Payer: Medicare Other | Admitting: Occupational Therapy

## 2015-02-09 ENCOUNTER — Inpatient Hospital Stay (HOSPITAL_COMMUNITY): Payer: Medicare Other | Admitting: Speech Pathology

## 2015-02-09 ENCOUNTER — Inpatient Hospital Stay (HOSPITAL_COMMUNITY): Payer: Self-pay | Admitting: Physical Therapy

## 2015-02-09 ENCOUNTER — Inpatient Hospital Stay (HOSPITAL_COMMUNITY): Payer: Medicare Other

## 2015-02-09 MED ORDER — APIXABAN 5 MG PO TABS
5.0000 mg | ORAL_TABLET | Freq: Two times a day (BID) | ORAL | Status: AC
  Administered 2015-02-09: 5 mg
  Filled 2015-02-09: qty 1

## 2015-02-09 MED ORDER — FLUCONAZOLE 40 MG/ML PO SUSR
200.0000 mg | Freq: Once | ORAL | Status: AC
  Administered 2015-02-09: 200 mg
  Filled 2015-02-09: qty 5

## 2015-02-09 NOTE — Progress Notes (Signed)
Physical Therapy Weekly Progress Note  Patient Details  Name: Brian [name] MRN: [id number] Date of Birth: September 16, 1928  Beginning of progress report period: February 03, 2015 End of progress report period: February 10, 2015  Patient has met 0 of 7 long term goals.  Short term goals not set due to estimated length of stay.  Pt continues to need min A for mobility and transfers for safety especially when fatigued.  W/c goals d/c'd due to pt anticipated to be ambulatory at discharge. Pt's family has begun family ed.  Patient continues to demonstrate the following deficits: decreased endurance, strength and balance and therefore will continue to benefit from skilled PT intervention to enhance overall performance with activity tolerance, balance and ability to compensate for deficits.  See Patient's Care Plan for progression toward long term goals.  Patient progressing toward long term goals..  Continue plan of care.  Skilled Therapeutic Interventions/Progress Updates:  Ambulation/gait training;Discharge planning;Functional mobility training;Psychosocial support;Therapeutic Activities;Balance/vestibular training;Disease management/prevention;Neuromuscular re-education;Therapeutic Exercise;Wheelchair propulsion/positioning;Cognitive remediation/compensation;DME/adaptive equipment instruction;Pain management;UE/LE Strength taining/ROM;Community reintegration;Patient/family education;Stair training;UE/LE Coordination activities     See Function Navigator for Current Functional Status.   DONAWERTH,KAREN 02/09/2015, 8:10 AM

## 2015-02-09 NOTE — Progress Notes (Signed)
Speech Language Pathology Daily Session Note  Patient Details  Name: CORTAVIUS BARRENTINE MRN: [id number] Date of Birth: 12-30-1928  Today's Date: 02/09/2015 SLP Individual Time: 45-1450 SLP Individual Time Calculation (min): 45 min  Short Term Goals: Week 1: SLP Short Term Goal 1 (Week 1): Pt will consume therapeutic ice chip trials with minimal overt signs of aspiration across two therapy sessions to determine readiness for repeat MBS SLP Short Term Goal 2 (Week 1): Pt will utilize multimodal communication methods to communicate basic wants/needs 75% of the time with Max cues SLP Short Term Goal 3 (Week 1): Pt will follow one-step commands with 75% accuracy with Max cues within basic, familiar tasks  Skilled Therapeutic Interventions:  Pt was seen for skilled ST targeting communication and ongoing family education regarding pt's goals for dysphagia and aphasia.  Pt verbally completed automatic sequences for 100% accuracy when counting from 1 to 10 with mod assist verbal cues and use of carrier phrases to initiate sequence.  Pt's accuracy for verbal expression decreased with longer sequence and he was 65% accurate for counting from 1 to 20.  Pt required max assist multimodal cues to identify and name numbers when presented out of sequential order for <50% accuracy.  Pt was able to produce the names of days of the week for ~43% accuracy with max assist multimodal cues and multiple trial repetitions.  Wife presented with additional questions regarding results of this morning's MBS.  SLP reviewed and reinforced recommendations and rationale behind continued NPO status.  Pt's wife verbalized understanding.  Pt's wife also presented with questions regarding activities to complete with pt in between therapy sessions to maximize his functional outcomes for communication.  SLP provided pt's wife with suggestions for automatic and functional tasks to elicit functional communication in between therapy sessions.  Pt  was left in bed with wife at bedside.  Continue per current plan of care.    Function:  Eating Eating                 Cognition Comprehension Comprehension assist level: Understands basic 75 - 89% of the time/ requires cueing 10 - 24% of the time  Expression   Expression assist level: Expresses basic 25 - 49% of the time/requires cueing 50 - 75% of the time. Uses single words/gestures.  Social Interaction Social Interaction assist level: Interacts appropriately 75 - 89% of the time - Needs redirection for appropriate language or to initiate interaction.  Problem Solving Problem solving assist level: Solves basic 50 - 74% of the time/requires cueing 25 - 49% of the time  Memory Memory assist level: Recognizes or recalls 25 - 49% of the time/requires cueing 50 - 75% of the time    Pain Pain Assessment Pain Assessment: No/denies pain Pain Score: 0-No pain  Therapy/Group: Individual Therapy  Caitlin Ainley, Selinda Orion 02/09/2015, 3:52 PM

## 2015-02-09 NOTE — Consult Note (Signed)
Chief Complaint: Patient was seen in consultation today for gastric tube placement at the request of Dr Ella Bodo  Referring Physician(s): Dr Ella Bodo  History of Present Illness: Brian [name] is a 81 y.o. male   Recent CVA Aphasia Dysphagia Failed swallow study Request for percutaneous gastric tube placement per Dr Ella Bodo Dr Earleen Newport has reviewed imaging and approves procedure I have seen and examined pt  Last dose Eliquis to be 11/18---orders in chart  Past Medical History  Diagnosis Date  . Muscle tremor     saw neurology remotely elsewhere, was Rx inderal  . Crohn's ileocolitis ([place])   . Osteopenia     per DEXA 11/2008  . Hypertension 11/11    mild  . Hemorrhoids   . Hypertrophy of prostate     w/o UR obst & oth luts  . Peripheral neuropathy (HCC)     h/o neg w/u  . ED (erectile dysfunction)   . Hypertriglyceridemia   . COPD (chronic obstructive pulmonary disease) ([place])     recent dx--no acute problems  . Hiatal hernia 1956    found while in service  . Anxiety   . Atrial fibrillation with RVR ([place])   . Long term (current) use of anticoagulants 02/21/2011  . Serrated adenoma of colon 03/2010  . Renal cyst     bilateral  . Pulmonary fibrosis ([place]) 2015    Rx Esbriet ~ 10-2013 ([name]), Dr. [name]  . On home oxygen therapy     "2L; 20-24h for the past week" (01/26/2015)  . Emphysema lung ([place]) 2016    Dr. [name]  . B12 deficiency anemia   . Idiopathic pulmonary fibrosis ([place]) 2016    Dr. [name]  . Pneumonia ?2015  . CAP (community acquired pneumonia) 01/26/2015  . Osteoarthritis     "maybe in my hands, feet" (01/26/2015)  . Depression   . Squamous cell carcinoma of skin of scalp     tx'd ~ 28yrs; "froze them off"  . Squamous cell carcinoma, face      "froze them off"  . Melanoma of scalp [facility])     Past Surgical History  Procedure Laterality Date  . Colon resection  1984    resection of distal ileum and cecum w/ appendectomy, 18-inch small  intestine, for Crohn's disease  . Colon surgery    . Appendectomy  1984  . Cataract extraction w/ intraocular lens  implant, bilateral Bilateral 2007  . Cholecystectomy  02/17/2011    Procedure: LAPAROSCOPIC CHOLECYSTECTOMY WITH INTRAOPERATIVE CHOLANGIOGRAM;  Surgeon: Edward Jolly, MD;  Location: [facility];  Service: General;  Laterality: N/A;  . Mohs surgery Right ~ 2014    "side of my scalp"    Allergies: Review of patient's allergies indicates no known allergies.  Medications: Prior to Admission medications   Medication Sig Start Date End Date Taking? Authorizing Provider  acetaminophen (TYLENOL) 325 MG tablet Take 650 mg by mouth as needed for pain.    Historical Provider, MD  apixaban (ELIQUIS) 5 MG TABS tablet Take 1 tablet (5 mg total) by mouth 2 (two) times daily. 02/02/15   Kelvin Cellar, MD  azelastine (ASTELIN) 0.1 % nasal spray Place 1 spray into both nostrils 2 (two) times daily as needed for allergies. Use in each nostril as directed    Historical Provider, MD  busPIRone (BUSPAR) 15 MG tablet Take 1 tablet (15 mg total) by mouth 2 (two) times daily. 11/22/14   Colon Branch, MD  clonazePAM (KLONOPIN) 0.5 MG  tablet Take 0.5 tablets (0.25 mg total) by mouth 3 (three) times daily as needed for anxiety. Patient taking differently: Take 0.125 mg by mouth 3 (three) times daily as needed for anxiety.  01/24/15   Colon Branch, MD  Cyanocobalamin (NASCOBAL) 500 MCG/0.1ML SOLN Use one spray nasally per week as directed. Patient taking differently: Place 0.1 mLs into the nose every 7 (seven) days. Use one spray nasally per week as directed. 12/14/14   Colon Branch, MD  famotidine (PEPCID) 40 MG tablet Take 1 tablet (40 mg total) by mouth daily. 01/27/15   Barton Dubois, MD  gemfibrozil (LOPID) 600 MG tablet Take 1 tablet (600 mg total) by mouth 2 (two) times daily. 07/25/14   Colon Branch, MD  guaiFENesin-dextromethorphan [facility] DM) 100-10 MG/5ML syrup Take 5 mLs by mouth every 4 (four)  hours as needed for cough. 01/27/15   Barton Dubois, MD  levofloxacin (LEVAQUIN) 25 MG/ML solution Place 20 mLs (500 mg total) into feeding tube daily. 02/02/15   Kelvin Cellar, MD  mesalamine (PENTASA) 250 MG CR capsule Take 2 capsules (500 mg total) by mouth 2 (two) times daily. 12/12/14   Ladene Artist, MD  Nutritional Supplements (FEEDING SUPPLEMENT, JEVITY 1.2 CAL,) LIQD Place 1,000 mLs into feeding tube continuous. 02/02/15   Kelvin Cellar, MD  OXYGEN Inhale into the lungs. 2 liters    Historical Provider, MD  Pirfenidone 267 MG CAPS Take 3 capsules by mouth 3 (three) times daily.    Historical Provider, MD  pravastatin (PRAVACHOL) 10 MG tablet Take 1 tablet (10 mg total) by mouth daily at 6 PM. 02/02/15   Kelvin Cellar, MD  psyllium (METAMUCIL SMOOTH TEXTURE) 28 % packet Take 1 packet by mouth daily as needed. For constipation    Historical Provider, MD  Water For Irrigation, Sterile (FREE WATER) SOLN Place 90 mLs into feeding tube every 4 (four) hours. 02/02/15   Kelvin Cellar, MD     Family History  Problem Relation Age of Onset  . Crohn's disease Brother   . Breast cancer Mother   . Colon cancer Neg Hx   . Prostate cancer Neg Hx   . Heart attack Father 42  . Dementia Sister   . Diabetes Sister   . Stroke Mother 83    Social History   Social History  . Marital Status: Married    Spouse Name: Dalene Seltzer  . Number of Children: 4  . Years of Education: N/A   Occupational History  . Retired   . RETIRED    Social History Main Topics  . Smoking status: Former Smoker -- 1.00 packs/day for 40 years    Types: Cigarettes, Pipe    Quit date: 03/24/1990  . Smokeless tobacco: Never Used  . Alcohol Use: No     Comment:    . Drug Use: No  . Sexual Activity: No   Other Topics Concern  . Not on file   Social History Narrative   Married, lives w/ wife       Review of Systems: A 12 point ROS discussed and pertinent positives are indicated in the HPI above.  All other  systems are negative.  Review of Systems  Constitutional: Positive for activity change and appetite change. Negative for fever.  HENT: Positive for trouble swallowing.   Respiratory: Negative for cough and shortness of breath.   Cardiovascular: Negative for chest pain.  Neurological: Positive for facial asymmetry, speech difficulty and weakness. Negative for dizziness, light-headedness and headaches.  Psychiatric/Behavioral: Positive for decreased concentration.    Vital Signs: BP 128/64 mmHg  Pulse 91  Temp(Src) 97.7 F (36.5 C) (Oral)  Resp 18  Wt 136 lb 4.8 oz (61.825 kg)  SpO2 99%  Physical Exam  Cardiovascular: Normal rate and regular rhythm.   Pulmonary/Chest: Effort normal.  Abdominal: Soft. Bowel sounds are normal.  Neurological:  Aphasic Moves all 4s Walking in hall with PT  Skin: Skin is warm and dry.  Psychiatric:  Consented wife and son at bedside  Nursing note and vitals reviewed.   Mallampati Score:  MD Evaluation Airway: WNL Heart: WNL Abdomen: WNL Chest/ Lungs: WNL ASA  Classification: 3 Mallampati/Airway Score: Two  Imaging: Ct Angio Head W/cm &/or Wo Cm  01/31/2015  CLINICAL DATA:  Right facial droop, right-sided weakness, and slurred speech. EXAM: CT ANGIOGRAPHY HEAD AND NECK TECHNIQUE: Multidetector CT imaging of the head and neck was performed using the standard protocol during bolus administration of intravenous contrast. Multiplanar CT image reconstructions and MIPs were obtained to evaluate the vascular anatomy. Carotid stenosis measurements (when applicable) are obtained utilizing NASCET criteria, using the distal internal carotid diameter as the denominator. CONTRAST:  61mL OMNIPAQUE IOHEXOL 350 MG/ML SOLN COMPARISON:  Noncontrast head CT earlier today. Chest radiographs 01/26/2015. FINDINGS: CTA NECK Aortic arch: 3 vessel aortic arch with mild atherosclerotic plaque. Brachiocephalic and subclavian arteries are patent without significant  stenosis. Right carotid system: Patent with minimal non stenotic plaque at the carotid bifurcation. Left carotid system: Patent without stenosis or significant atherosclerosis. Vertebral arteries: Patent without significant stenosis. Left vertebral artery is mildly dominant. Skeleton: Moderate multilevel cervical disc and facet degeneration. Other neck: Extensive consolidation is partially visualized in the right upper lobe as seen on recent chest radiographs, with areas of cavitation. This is superimposed on a background of advanced centrilobular emphysema. There are multiple enlarged upper mediastinal lymph nodes predominantly in the right paratracheal region which measure up to 1.2 cm in short axis. CTA HEAD Anterior circulation: Internal carotid arteries are patent from skullbase to carotid termini with minimal atherosclerotic calcification but no stenosis. There is a 2 mm left posterior communicating artery region infundibulum versus aneurysm. M1 segments are widely patent. MCA bifurcations are patent. There is occlusion of a left M2 inferior division branch vessel approximately 1.5 cm distal to the MCA bifurcation (series 9, image 88 and series 11, image 117). ACAs are patent without significant stenosis. Posterior circulation: Intracranial vertebral arteries are patent with the left being dominant. Right vertebral artery is markedly hypoplastic distal to the PICA origin. SCA origins are patent. Basilar artery is diffusely small in caliber on a developmental basis without significant superimposed focal stenosis. There is a fetal origin of the right PCA. PCAs are patent without evidence of significant stenosis. Venous sinuses: Patent. Anatomic variants: Fetal origin of the right PCA. IMPRESSION: 1. No large vessel occlusion. 2. Left M2 MCA branch vessel occlusion. 3. No cervical carotid or vertebral artery stenosis. 4. 2 mm left posterior communicating artery infundibulum versus aneurysm. 5. Extensive right  upper lobe lung consolidation with areas of cavitation, incompletely visualized. Mild mediastinal lymphadenopathy. These results were called by telephone at the time of interpretation on 01/31/2015 at 10:30 am to [place] , who verbally acknowledged these results. Electronically Signed   By: Logan Bores M.D.   On: 01/31/2015 10:35   Dg Chest 2 View  01/26/2015  CLINICAL DATA:  Acute shortness of breath for 1 week. History of pulmonary fibrosis and COPD EXAM: CHEST  2 VIEW COMPARISON:  06/07/2013 FINDINGS: New patchy right upper lobe airspace opacity abutting the fissure compatible with superimposed pneumonia. Background COPD/ emphysema evident with chronic basilar pulmonary fibrosis. Stable left midline calcified granuloma. Chronic apical scarring evident. Stable heart size and vascularity. No associated effusion or edema pattern. Trachea is midline. No acute osseous finding. IMPRESSION: Patchy right upper lobe pneumonia superimposed on chronic COPD and basilar fibrosis Remote granulomatous disease Recommend short-term radiographic follow-up after medical therapy to document resolution. Electronically Signed   By: Jerilynn Mages.  Shick M.D.   On: 01/26/2015 10:33   Ct Head Wo Contrast  01/31/2015  CLINICAL DATA:  Code stroke. Right-sided weakness with facial droop and slurred speech EXAM: CT HEAD WITHOUT CONTRAST TECHNIQUE: Contiguous axial images were obtained from the base of the skull through the vertex without intravenous contrast. COMPARISON:  None. FINDINGS: Ventricle size normal.  Cerebral volume normal. Mild patchy hypodensity in the cerebral white matter bilaterally, most consistent with ischemia of chronic duration. Negative for acute infarct. Negative for hemorrhage or mass. Mild calcification in the basal ganglia bilaterally. Normal arterial density. Calvarium intact. IMPRESSION: Mild chronic microvascular ischemia.  No acute abnormality. Critical Value/emergent results were called by telephone at the  time of interpretation on 01/31/2015 at 9:39 am to Dr. Armida Sans, who verbally acknowledged these results. Electronically Signed   By: Franchot Gallo M.D.   On: 01/31/2015 09:39   Ct Angio Neck W/cm &/or Wo/cm  01/31/2015  CLINICAL DATA:  Right facial droop, right-sided weakness, and slurred speech. EXAM: CT ANGIOGRAPHY HEAD AND NECK TECHNIQUE: Multidetector CT imaging of the head and neck was performed using the standard protocol during bolus administration of intravenous contrast. Multiplanar CT image reconstructions and MIPs were obtained to evaluate the vascular anatomy. Carotid stenosis measurements (when applicable) are obtained utilizing NASCET criteria, using the distal internal carotid diameter as the denominator. CONTRAST:  6mL OMNIPAQUE IOHEXOL 350 MG/ML SOLN COMPARISON:  Noncontrast head CT earlier today. Chest radiographs 01/26/2015. FINDINGS: CTA NECK Aortic arch: 3 vessel aortic arch with mild atherosclerotic plaque. Brachiocephalic and subclavian arteries are patent without significant stenosis. Right carotid system: Patent with minimal non stenotic plaque at the carotid bifurcation. Left carotid system: Patent without stenosis or significant atherosclerosis. Vertebral arteries: Patent without significant stenosis. Left vertebral artery is mildly dominant. Skeleton: Moderate multilevel cervical disc and facet degeneration. Other neck: Extensive consolidation is partially visualized in the right upper lobe as seen on recent chest radiographs, with areas of cavitation. This is superimposed on a background of advanced centrilobular emphysema. There are multiple enlarged upper mediastinal lymph nodes predominantly in the right paratracheal region which measure up to 1.2 cm in short axis. CTA HEAD Anterior circulation: Internal carotid arteries are patent from skullbase to carotid termini with minimal atherosclerotic calcification but no stenosis. There is a 2 mm left posterior communicating artery region  infundibulum versus aneurysm. M1 segments are widely patent. MCA bifurcations are patent. There is occlusion of a left M2 inferior division branch vessel approximately 1.5 cm distal to the MCA bifurcation (series 9, image 88 and series 11, image 117). ACAs are patent without significant stenosis. Posterior circulation: Intracranial vertebral arteries are patent with the left being dominant. Right vertebral artery is markedly hypoplastic distal to the PICA origin. SCA origins are patent. Basilar artery is diffusely small in caliber on a developmental basis without significant superimposed focal stenosis. There is a fetal origin of the right PCA. PCAs are patent without evidence of significant stenosis. Venous sinuses: Patent. Anatomic variants: Fetal  origin of the right PCA. IMPRESSION: 1. No large vessel occlusion. 2. Left M2 MCA branch vessel occlusion. 3. No cervical carotid or vertebral artery stenosis. 4. 2 mm left posterior communicating artery infundibulum versus aneurysm. 5. Extensive right upper lobe lung consolidation with areas of cavitation, incompletely visualized. Mild mediastinal lymphadenopathy. These results were called by telephone at the time of interpretation on 01/31/2015 at 10:30 am to [place] , who verbally acknowledged these results. Electronically Signed   By: Logan Bores M.D.   On: 01/31/2015 10:35   Mr Jodene Nam Head Wo Contrast  01/31/2015  CLINICAL DATA:  Since change in speech at 8 o\'clock a.m. Right arm weakness. Right facial droop. A aphasia. Right arm weakness has improved. EXAM: MRI HEAD WITHOUT CONTRAST MRA HEAD WITHOUT CONTRAST TECHNIQUE: Multiplanar, multiecho pulse sequences of the brain and surrounding structures were obtained without intravenous contrast. Angiographic images of the head were obtained using MRA technique without contrast. COMPARISON:  CT head without contrast in CTA head and neck from the same day. FINDINGS: MRI HEAD FINDINGS Acute nonhemorrhagic posterior  left MCA territory infarct is confirmed. This involves the left sylvian fissure extends superiorly within the left parietal lobe. There is at least 1 additional punctate cortical infarct in the left parietal lobe on image 31 of series 7. T2 changes are evident within the area of acute infarction. Additional subcortical and periventricular T2 changes are evident bilaterally. Flow is present in the major intracranial arteries. Bilateral lens replacements are present. The paranasal sinuses mastoid air cells are clear. MRA HEAD FINDINGS Internal carotid arteries are within normal limits from the high cervical segments through the ICA termini bilaterally. The A1 and M1 segments are normal. Anterior communicating artery is patent. ACA branch vessels are within normal limits. The MCA bifurcations are intact bilaterally. Right MCA branch vessels are normal. A distal posterior left MCA branch vessel occlusion is noted. One additional left M3 branch vessels opacified compared to the earlier CTA. The left vertebral artery is the dominant vessel. The right PICA origin is visualized and normal. AICA vessels are noted bilaterally. The left posterior cerebral artery originates from the basilar tip. The right posterior cerebral artery originates from the basilar tip and a posterior communicating artery. The PCA branch vessels are intact. IMPRESSION: 1. Acute nonhemorrhagic posterior left MCA territory infarct is confirmed. 2. Occlusion of distal left posterior MCA branch vessel. There is opacification of an additional left MCA branch vessels since the earlier CTA study. 3. Extensive white matter disease is present in addition to the acute infarct. Electronically Signed   By: San Morelle M.D.   On: 01/31/2015 16:09   Mr Brain Wo Contrast  01/31/2015  CLINICAL DATA:  Since change in speech at 8 o\'clock a.m. Right arm weakness. Right facial droop. A aphasia. Right arm weakness has improved. EXAM: MRI HEAD WITHOUT CONTRAST  MRA HEAD WITHOUT CONTRAST TECHNIQUE: Multiplanar, multiecho pulse sequences of the brain and surrounding structures were obtained without intravenous contrast. Angiographic images of the head were obtained using MRA technique without contrast. COMPARISON:  CT head without contrast in CTA head and neck from the same day. FINDINGS: MRI HEAD FINDINGS Acute nonhemorrhagic posterior left MCA territory infarct is confirmed. This involves the left sylvian fissure extends superiorly within the left parietal lobe. There is at least 1 additional punctate cortical infarct in the left parietal lobe on image 31 of series 7. T2 changes are evident within the area of acute infarction. Additional subcortical and periventricular T2 changes  are evident bilaterally. Flow is present in the major intracranial arteries. Bilateral lens replacements are present. The paranasal sinuses mastoid air cells are clear. MRA HEAD FINDINGS Internal carotid arteries are within normal limits from the high cervical segments through the ICA termini bilaterally. The A1 and M1 segments are normal. Anterior communicating artery is patent. ACA branch vessels are within normal limits. The MCA bifurcations are intact bilaterally. Right MCA branch vessels are normal. A distal posterior left MCA branch vessel occlusion is noted. One additional left M3 branch vessels opacified compared to the earlier CTA. The left vertebral artery is the dominant vessel. The right PICA origin is visualized and normal. AICA vessels are noted bilaterally. The left posterior cerebral artery originates from the basilar tip. The right posterior cerebral artery originates from the basilar tip and a posterior communicating artery. The PCA branch vessels are intact. IMPRESSION: 1. Acute nonhemorrhagic posterior left MCA territory infarct is confirmed. 2. Occlusion of distal left posterior MCA branch vessel. There is opacification of an additional left MCA branch vessels since the  earlier CTA study. 3. Extensive white matter disease is present in addition to the acute infarct. Electronically Signed   By: San Morelle M.D.   On: 01/31/2015 16:09   Dg Chest Port 1 View  02/02/2015  CLINICAL DATA:  Pneumonia. EXAM: PORTABLE CHEST 1 VIEW COMPARISON:  01/31/2015.  06/07/2013. FINDINGS: Mediastinum hilar structures are normal. Cardiomegaly. Right upper lobe infiltrate again noted consistent with pneumonia. No interim change. Mild increase in pulmonary interstitial markings noted in the lung bases. These changes could be related interstitial edema and/or pneumonitis. A component interstitial prominence in the lung bases is most likely related to chronic interstitial lung disease. No prominent pleural effusion or pneumothorax. IMPRESSION: 1. Persistent right upper lobe infiltrate consistent with pneumonia. No significant interim improvement. 2. Cardiomegaly with increase in pulmonary interstitial prominence in the lung bases. Congestive heart failure cannot be excluded. Pneumonitis cannot be excluded. A component of the interstitial prominence in the lung bases is also most likely related chronic interstitial lung disease. Electronically Signed   By: Marcello Moores  Register   On: 02/02/2015 07:23   Dg Chest Port 1 View  01/31/2015  CLINICAL DATA:  Pneumonia and call EXAM: PORTABLE CHEST 1 VIEW COMPARISON:  Radiograph 01/26/2015 FINDINGS: Normal cardiac silhouette. There is airspace opacity in the RIGHT upper lobe which is increased slightly in density. Mild airspace disease in the RIGHT lower lobe appears chronic. Underlying severe emphysematous change in the RIGHT upper lobe. IMPRESSION: Slight worsening of RIGHT upper lobe pneumonia superimposed on emphysematous change. Electronically Signed   By: Suzy Bouchard M.D.   On: 01/31/2015 11:42   Dg Abd Portable 1v  02/02/2015  CLINICAL DATA:  NG tube placement. EXAM: PORTABLE ABDOMEN - 1 VIEW COMPARISON:  CT abdomen and pelvis 07/14/2013  FINDINGS: A weighted tip enteric tube is present and terminates in the expected region of the proximal third portion of the duodenum. Gas is present in nondilated loops of small and large bowel without evidence of obstruction, although the lower abdomen/ pelvis was not imaged. Right upper quadrant abdominal surgical clips are noted. Right upper lobe lung consolidation is more fully evaluated on chest radiograph earlier today. IMPRESSION: Feeding tube terminates in the region of the proximal third portion of the duodenum. Electronically Signed   By: Logan Bores M.D.   On: 02/02/2015 12:49   Dg Swallowing Func-speech Pathology  02/09/2015  Objective Swallowing Evaluation: MBS-Modified Barium Swallow Study Patient Details Name: Brian [name]  MAYAN FILLERS MRN: [id number] Date of Birth: 30-Jul-1928 Today's Date: 02/09/2015 Time: 0903 - 0930  Past Medical History: Past Medical History Diagnosis Date . Muscle tremor    saw neurology remotely elsewhere, was Rx inderal . Crohn's ileocolitis ([place])  . Osteopenia    per DEXA 11/2008 . Hypertension 11/11   mild . Hemorrhoids  . Hypertrophy of prostate    w/o UR obst & oth luts . Peripheral neuropathy (HCC)    h/o neg w/u . ED (erectile dysfunction)  . Hypertriglyceridemia  . COPD (chronic obstructive pulmonary disease) ([place])    recent dx--no acute problems . Hiatal hernia 1956   found while in service . Anxiety  . Atrial fibrillation with RVR ([place])  . Long term (current) use of anticoagulants 02/21/2011 . Serrated adenoma of colon 03/2010 . Renal cyst    bilateral . Pulmonary fibrosis ([place]) 2015   Rx Esbriet ~ 10-2013 ([name]), Dr. [name] . On home oxygen therapy    "2L; 20-24h for the past week" (01/26/2015) . Emphysema lung ([place]) 2016   Dr. [name] . B12 deficiency anemia  . Idiopathic pulmonary fibrosis ([place]) 2016   Dr. [name] . Pneumonia ?2015 . CAP (community acquired pneumonia) 01/26/2015 . Osteoarthritis    "maybe in my hands, feet" (01/26/2015) . Depression  . Squamous cell carcinoma of  skin of scalp    tx'd ~ 81yrs; "froze them off" . Squamous cell carcinoma, face     "froze them off" . Melanoma of scalp [facility])  Past Surgical History: Past Surgical History Procedure Laterality Date . Colon resection  1984   resection of distal ileum and cecum w/ appendectomy, 18-inch small intestine, for Crohn's disease . Colon surgery   . Appendectomy  1984 . Cataract extraction w/ intraocular lens  implant, bilateral Bilateral 2007 . Cholecystectomy  02/17/2011   Procedure: LAPAROSCOPIC CHOLECYSTECTOMY WITH INTRAOPERATIVE CHOLANGIOGRAM;  Surgeon: Edward Jolly, MD;  Location: [facility];  Service: General;  Laterality: N/A; . Mohs surgery Right ~ 2014   "side of my scalp" HPI: 81 y.o. male with pulmonary fibrosis, atrial fibrillation, Chron's, HTN, COPD, hiatal hernia and pna 01/26/15 admitted with right side weakness and inability to speak. CT negative, MRI showed acute nonhemorrhagic posterior left MCA territory infarct. CXR Slight worsening of RIGHT upper lobe pneumonia superimposed on emphysematous change. Made NPO per MBS 02/01/2015.  Repeat MBS ordered today to determine readiness for initiation of PO diet .   Recommendation/Prognosis Clinical Impression:  Therapy Diagnosis: Severe pharyngeal phase dysphagia Despite subjective improvements perceived at bedside, pt demonstrates little functional improvement from previous MBS and continues to present with a severe oropharyngeal neuromuscular dysphagia characterized by sensorimotor impairment. Reduced sensation is still present and results in premature spillage of materials into the pharynx.  Pharyngeal phase deficits are again characterized by reduced hyolaryngeal elevation resulting in reduced epiglottic deflection and delayed swallow initiation for puree and honey thick liquids via teaspoon.  Delayed swallow initiation and sensory deficits resulted in unsensed aspiration of honey thick liquids. Pt continues to have significant pharyngeal residue across all  consistencies.  Vallecular residue is greater than pyriform sinus residue.  Pt was better able to utilize a chin tuck and extra swallows in comparison to previous swallow study; however, despite use of compensatory strategies, pt continues to demonstrate ineffective transit of boluses through the esophagus due to weakness and SLP had to cue pt for "hocking" to orally expectorate small amounts of pharyngeal residue. After study ended, pt was noted with wet vocal quality and congested,  wet cough which SLP suspects to be related to delayed sensation of remaining pharyngeal residuals entering the airway.   SLP recommends that pt remain NPO with trials of ice chips following oral care with SLP only.  SLP will continue to f/u for intensive dysphagia treatment with PO trials.  SLP provided skilled education with pt's wife and son after completion of study regarding results and recommendations.  All family's questions were answered to their satisfaction at this time.   Swallow Evaluation Recommendations:  SLP Diet Recommendations: NPO Medication Administration: Via alternative means    Individuals Consulted: Consulted and Agree with Results and Recommendations: Patient;Family member/caregiver Family Member Consulted: wife and son  SLP Assessment/Plan General: Date of Onset: 01/26/15 Type of Study: MBS-Modified Barium Swallow Study Previous Swallow Assessment: MBS this admission recommending NP) Diet Prior to this Study: NPO;NG Tube Temperature Spikes Noted: No History of Recent Intubation: No Oral Cavity - Dentition: Edentulous Self-Feeding Abilities: Able to feed self Baseline Vocal Quality: Normal Volitional Cough: Strong Anatomy: Within functional limits    Oral Phase: Oral Preparation/Oral Phase Oral Phase: Within functional limits  Pharyngeal Phase:  Pharyngeal Phase Pharyngeal Phase: Impaired Pharyngeal - Honey Pharyngeal- Honey Teaspoon: Delayed swallow initiation;Swallow initiation at pyriform sinus;Reduced  airway/laryngeal closure;Reduced anterior laryngeal mobility;Reduced epiglottic inversion;Reduced laryngeal elevation;Penetration/Aspiration during swallow;Moderate aspiration;Pharyngeal residue - pyriform;Pharyngeal residue - valleculae Pharyngeal: Material enters airway, passes BELOW cords without attempt by patient to eject out (silent aspiration) Pharyngeal - Solids Pharyngeal- Puree: Swallow initiation at vallecula;Reduced laryngeal elevation;Reduced airway/laryngeal closure;Reduced epiglottic inversion;Reduced anterior laryngeal mobility;Pharyngeal residue - pyriform;Pharyngeal residue - valleculae     GN     Page, Selinda Orion 02/09/2015, 12:14 PM  Dg Swallowing Func-speech Pathology  02/01/2015  Objective Swallowing Evaluation:   Patient Details Name: Brian [name] MRN: [id number] Date of Birth: [birth year] Today's Date: 02/01/2015 Time: SLP Start Time (ACUTE ONLY): 0945-SLP Stop Time (ACUTE ONLY): 1010 SLP Time Calculation (min) (ACUTE ONLY): 25 min Past Medical History: Past Medical History Diagnosis Date . Muscle tremor    saw neurology remotely elsewhere, was Rx inderal . Crohn's ileocolitis ([place])  . Osteopenia    per DEXA 11/2008 . Hypertension 11/11   mild . Hemorrhoids  . Hypertrophy of prostate    w/o UR obst & oth luts . Peripheral neuropathy ([facility])    h/o neg w/u . ED (erectile dysfunction)  . Hypertriglyceridemia  . COPD (chronic obstructive pulmonary disease) ([place])    recent dx--no acute problems . Hiatal hernia 1956   found while in service . Anxiety  . Atrial fibrillation with RVR ([place])  . Long term (current) use of anticoagulants 02/21/2011 . Serrated adenoma of colon 03/2010 . Renal cyst    bilateral . Pulmonary fibrosis ([place]) 2015   Rx Esbriet ~ 10-2013 ([name]), Dr. [name] . On home oxygen therapy    "2L; 20-24h for the past week" (01/26/2015) . Emphysema lung ([place]) 2016   Dr. [name] . B12 deficiency anemia  . Idiopathic pulmonary fibrosis ([place]) 2016   Dr. [name] . Pneumonia ?2015 . CAP (community  acquired pneumonia) 01/26/2015 . Osteoarthritis    "maybe in my hands, feet" (01/26/2015) . Depression  . Squamous cell carcinoma of skin of scalp    tx'd ~ 46yrs; "froze them off" . Squamous cell carcinoma, face     "froze them off" . Melanoma of scalp [facility])  Past Surgical History: Past Surgical History Procedure Laterality Date . Colon resection  1984   resection of distal ileum and cecum w/ appendectomy, 18-inch small  intestine, for Crohn's disease . Colon surgery   . Appendectomy  1984 . Cataract extraction w/ intraocular lens  implant, bilateral Bilateral 2007 . Cholecystectomy  02/17/2011   Procedure: LAPAROSCOPIC CHOLECYSTECTOMY WITH INTRAOPERATIVE CHOLANGIOGRAM;  Surgeon: Edward Jolly, MD;  Location: [facility];  Service: General;  Laterality: N/A; . Mohs surgery Right ~ 2014   "side of my scalp" HPI: 80 y.o. male with pulmonary fibrosis, atrial fibrillation, Chron's, HTN, COPD, hiatal hernia and pna 01/26/15 admitted with right side weakness and inability to speak. CT negative, MRI showed acute nonhemorrhagic posterior left MCA territory infarct. CXR Slight worsening of RIGHT upper lobe pneumonia superimposed on emphysematous change. BSE 11/4 without difficulty and regular/thin recommended. Subjective: pleasant, talkative Assessment / Plan / Recommendation CHL IP CLINICAL IMPRESSIONS 02/01/2015 Therapy Diagnosis Severe pharyngeal phase dysphagia Clinical Impression Pt demonstrated severe oropharyngeal neuromuscular dysphagia characterized by sensorimotor impariment. Pt has adequate oral movement, however due reduced sensation and muscular control results in premature spillage. Pt's pharyngeal phase is characterized by reduced hyolaryngeal elevation resulting in reduced epiglottic deflection and delayed swallow initation to the valleculae for puree and pyriforms for liquids, which results in unsensed penetration/aspiration of liquids. Pt did not sense aspiration until after it passed through his vocal cords.   Pt has significant pharyngeal residue across all consistences. Pt's global aphasia interferes with compensatory strategies and max verbal, tactile, and visual cues for second swallow to clear residue. While a chin tuck with mod-max assist reduced vallecular residue with thin liquids, SLP recommends pt remain NPO due to high risk of aspiration and risk of futher respiratory distress; pt has no functional reserve due to baseline pulmonary fibrosis and right upper lobe pneumonia prior to stroke.  Pt will benefit from CIR for intensive dysphagia and aphasia treatment. SLP will f/u with diagnostic assessment and potential repeat of objective assessment if swallow function has improved.    CHL IP TREATMENT RECOMMENDATION 02/01/2015 Treatment Recommendations Therapy as outlined in treatment plan below   CHL IP DIET RECOMMENDATION 02/01/2015 SLP Diet Recommendations NPO;Alternative means - temporary Liquid Administration via -- Medication Administration Via alternative means Compensations -- Postural Changes --   CHL IP OTHER RECOMMENDATIONS 02/01/2015 Recommended Consults -- Oral Care Recommendations Oral care QID Other Recommendations --   CHL IP FOLLOW UP RECOMMENDATIONS 02/01/2015 Follow up Recommendations Inpatient Rehab   CHL IP FREQUENCY AND DURATION 02/01/2015 Speech Therapy Frequency (ACUTE ONLY) min 3x week Treatment Duration --      CHL IP ORAL PHASE 02/01/2015 Oral Phase Impaired Oral - Pudding Teaspoon -- Oral - Pudding Cup -- Oral - Honey Teaspoon -- Oral - Honey Cup -- Oral - Nectar Teaspoon -- Oral - Nectar Cup Delayed oral transit;Premature spillage Oral - Nectar Straw Delayed oral transit;Premature spillage Oral - Thin Teaspoon -- Oral - Thin Cup Delayed oral transit;Premature spillage Oral - Thin Straw Delayed oral transit;Premature spillage Oral - Puree Delayed oral transit;Premature spillage Oral - Mech Soft -- Oral - Regular -- Oral - Multi-Consistency -- Oral - Pill -- Oral Phase - Comment --  CHL IP  PHARYNGEAL PHASE 02/01/2015 Pharyngeal Phase Nectar;Thin;Solids Pharyngeal- Pudding Teaspoon -- Pharyngeal -- Pharyngeal- Pudding Cup -- Pharyngeal -- Pharyngeal- Honey Teaspoon -- Pharyngeal -- Pharyngeal- Honey Cup -- Pharyngeal -- Pharyngeal- Nectar Teaspoon NT Pharyngeal -- Pharyngeal- Nectar Cup Delayed swallow initiation-vallecula;Delayed swallow initiation-pyriform sinuses;Reduced epiglottic inversion;Penetration/Aspiration during swallow;Penetration/Aspiration before swallow;Trace aspiration;Pharyngeal residue - valleculae;Pharyngeal residue - pyriform Pharyngeal Material enters airway, remains ABOVE vocal cords and not ejected out Pharyngeal- Nectar Straw Delayed swallow  initiation-vallecula;Delayed swallow initiation-pyriform sinuses;Reduced epiglottic inversion;Penetration/Aspiration during swallow;Penetration/Apiration after swallow;Trace aspiration;Pharyngeal residue - valleculae;Pharyngeal residue - pyriform Pharyngeal Material enters airway, remains ABOVE vocal cords and not ejected out Pharyngeal- Thin Teaspoon NT Pharyngeal -- Pharyngeal- Thin Cup Delayed swallow initiation-vallecula;Delayed swallow initiation-pyriform sinuses;Reduced epiglottic inversion;Penetration/Aspiration during swallow;Penetration/Apiration after swallow;Trace aspiration;Pharyngeal residue - valleculae;Pharyngeal residue - pyriform Pharyngeal Material enters airway, passes BELOW cords without attempt by patient to eject out (silent aspiration);Material enters airway, passes BELOW cords and not ejected out despite cough attempt by patient Pharyngeal- Thin Straw Delayed swallow initiation-vallecula;Delayed swallow initiation-pyriform sinuses;Reduced epiglottic inversion;Penetration/Aspiration during swallow;Penetration/Apiration after swallow;Trace aspiration;Pharyngeal residue - valleculae;Pharyngeal residue - pyriform Pharyngeal Material enters airway, passes BELOW cords without attempt by patient to eject out (silent  aspiration);Material enters airway, passes BELOW cords and not ejected out despite cough attempt by patient Pharyngeal- Puree Delayed swallow initiation-vallecula;Reduced epiglottic inversion;Pharyngeal residue - valleculae;Pharyngeal residue - pyriform Pharyngeal -- Pharyngeal- Mechanical Soft NT Pharyngeal -- Pharyngeal- Regular NT Pharyngeal -- Pharyngeal- Multi-consistency NT Pharyngeal -- Pharyngeal- Pill NT Pharyngeal -- Pharyngeal Comment --  CHL IP CERVICAL ESOPHAGEAL PHASE 02/01/2015 Cervical Esophageal Phase WFL Pudding Teaspoon -- Pudding Cup -- Honey Teaspoon -- Honey Cup -- Nectar Teaspoon -- Nectar Cup -- Nectar Straw -- Thin Teaspoon -- Thin Cup -- Thin Straw -- Puree -- Mechanical Soft -- Regular -- Multi-consistency -- Pill -- Cervical Esophageal Comment -- Completed by Lanier Ensign, SLP Student Supervised and reviewed by Herbie Baltimore MA CCC-SLP [place], Katherene Ponto 02/01/2015, 2:43 PM               Labs:  CBC:  Recent Labs  01/31/15 0909 01/31/15 0917 02/02/15 0550 02/03/15 0426 02/06/15 0514  WBC 14.3*  --  17.5* 15.7* 15.6*  HGB 14.0 15.0 12.5* 12.5* 11.8*  HCT 40.6 44.0 37.1* 37.2* 36.4*  PLT 222  --  218 242 234    COAGS:  Recent Labs  01/18/15 1054 01/26/15 0950 01/27/15 0331 01/31/15 0909  INR 2.2 3.00* 3.95* 1.97*  APTT  --   --   --  32    BMP:  Recent Labs  01/31/15 0909 01/31/15 0917 02/02/15 0550 02/03/15 0426 02/06/15 0514  NA 140 142 137 136 137  K 4.0 4.0 3.0* 3.5 3.7  CL 107 105 100* 100* 99*  CO2 25  --  27 28 33*  GLUCOSE 96 94 123* 129* 146*  BUN 17 20 12 15 17   CALCIUM 9.0  --  8.3* 8.4* 8.2*  CREATININE 0.83 0.80 0.89 0.86 0.78  GFRNONAA >60  --  >60 >60 >60  GFRAA >60  --  >60 >60 >60    LIVER FUNCTION TESTS:  Recent Labs  01/31/15 0909 02/03/15 0426  BILITOT 0.5 0.5  AST 54* 36  ALT 66* 27  ALKPHOS 118 98  PROT 6.6 6.3*  ALBUMIN 2.1* 1.8*    TUMOR MARKERS: No results for input(s): AFPTM, CEA, CA199,  CHROMGRNA in the last 8760 hours.  Assessment and Plan:  CVA 01/31/2015 Aphasia Dysphagia Now scheduled for percutaneous gastric tube placement Risks and Benefits discussed with the patient including, but not limited to the need for a barium enema during the procedure, bleeding, infection, peritonitis, or damage to adjacent structures. All of the patient's questions were answered, patient is agreeable to proceed. Consent signed and in chart.  LD Eliquis 11/18---HELD  Thank you for this interesting consult.  I greatly enjoyed meeting MUNASAR CONKEY and look forward to participating in their care.  A copy of this report  was sent to the requesting provider on this date.  Signed: Graison Leinberger A 02/09/2015, 3:15 PM   I spent a total of 40 Minutes    in face to face in clinical consultation, greater than 50% of which was counseling/coordinating care for percutaneous gastric tube

## 2015-02-09 NOTE — Progress Notes (Signed)
Subjective/Complaints: Discussed skin issues with RN, Also reviewed SLP note  ROS cannot obtain due to aphasia  Objective: Vital Signs: Blood pressure 128/64, pulse 91, temperature 97.7 F (36.5 C), temperature source Oral, resp. rate 18, weight 61.825 kg (136 lb 4.8 oz), SpO2 99 %. No results found. No results found for this or any previous visit (from the past 72 hour(s)).   HEENT: normal Cardio: irregular and tachy Resp: CTA B/L and unlabored GI: BS positive and NT, ND Extremity:  No Edema Skin:   Perianal erythema no skin breaks, pink in groin skin creases Neuro: Abnormal Sensory cannot assess due to aphasia, Abnormal Motor 5/5 in BUE and BLE and Other receptive and exp language deficits Musc/Skel:  Normal and Other No pain ROM of UE or LE Gen NAD   Assessment/Plan: 1. Functional deficits secondary to Left MCA which require 3+ hours per day of interdisciplinary therapy in a comprehensive inpatient rehab setting. Physiatrist is providing close team supervision and 24 hour management of active medical problems listed below. Physiatrist and rehab team continue to assess barriers to discharge/monitor patient progress toward functional and medical goals.  FIM: Function - Bathing Position: Shower Body parts bathed by patient: Right arm, Left arm, Chest, Abdomen, Front perineal area, Buttocks, Right upper leg, Back, Left lower leg, Right lower leg, Left upper leg Assist Level: Touching or steadying assistance(Pt > 75%)  Function- Upper Body Dressing/Undressing What is the patient wearing?: Pull over shirt/dress Pull over shirt/dress - Perfomed by patient: Thread/unthread right sleeve, Thread/unthread left sleeve, Pull shirt over trunk Pull over shirt/dress - Perfomed by helper: Put head through opening Assist Level: Supervision or verbal cues Set up : To obtain clothing/put away Function - Lower Body Dressing/Undressing What is the patient wearing?: Pants, Socks, Shoes,  Underwear Position: Other (comment) (bedside chair) Underwear - Performed by patient: Thread/unthread right underwear leg, Thread/unthread left underwear leg, Pull underwear up/down Pants- Performed by patient: Thread/unthread right pants leg, Thread/unthread left pants leg, Pull pants up/down, Fasten/unfasten pants Socks - Performed by patient: Don/doff right sock, Don/doff left sock Shoes - Performed by patient: Don/doff right shoe, Don/doff left shoe, Fasten right, Fasten left Assist for lower body dressing: Touching or steadying assistance (Pt > 75%)  Function - Toileting Toileting activity did not occur: No continent bowel/bladder event Toileting steps completed by patient: Performs perineal hygiene, Adjust clothing after toileting, Adjust clothing prior to toileting Assist level: Touching or steadying assistance (Pt.75%)  Function - Air cabin crew transfer activity did not occur: N/A Assist level to toilet: Touching or steadying assistance (Pt > 75%) Assist level from toilet: Touching or steadying assistance (Pt > 75%)  Function - Chair/bed transfer Chair/bed transfer method: Ambulatory Chair/bed transfer assist level: Touching or steadying assistance (Pt > 75%) Chair/bed transfer assistive device: Armrests Chair/bed transfer details: Verbal cues for precautions/safety, Verbal cues for technique  Function - Locomotion: Wheelchair Will patient use wheelchair at discharge?: No Type: Manual Max wheelchair distance: 150 Assist Level: Supervision or verbal cues Assist Level: Supervision or verbal cues Wheel 150 feet activity did not occur: Safety/medical concerns Assist Level: Supervision or verbal cues Turns around,maneuvers to table,bed, and toilet,negotiates 3% grade,maneuvers on rugs and over doorsills: No Function - Locomotion: Ambulation Assistive device: Hand held assist (wife providing HHA) Max distance: 160 ft Assist level: Touching or steadying assistance (Pt >  75%) Assist level: Touching or steadying assistance (Pt > 75%) Walk 50 feet with 2 turns activity did not occur: Safety/medical concerns Assist level: Touching or  steadying assistance (Pt > 75%) Walk 150 feet activity did not occur: Safety/medical concerns Assist level: Touching or steadying assistance (Pt > 75%) Walk 10 feet on uneven surfaces activity did not occur: Safety/medical concerns Assist level: Maximal assist (Pt 25 - 49%) (LOB backwards)  Function - Comprehension Comprehension: Auditory Comprehension assist level: Understands basic 75 - 89% of the time/ requires cueing 10 - 24% of the time  Function - Expression Expression: Verbal Expression assist level: Expresses basic 25 - 49% of the time/requires cueing 50 - 75% of the time. Uses single words/gestures.  Function - Social Interaction Social Interaction assist level: Interacts appropriately 75 - 89% of the time - Needs redirection for appropriate language or to initiate interaction.  Function - Problem Solving Problem solving assist level: Solves basic 50 - 74% of the time/requires cueing 25 - 49% of the time  Function - Memory Memory assist level: Recognizes or recalls 25 - 49% of the time/requires cueing 50 - 75% of the time Patient normally able to recall (first 3 days only): None of the above  Medical Problem List and Plan: 1. 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  DVT Prophylaxis/Anticoagulation: Eliquis. Monitor CBC and BMET, small drop in hgb but no overt bleeding 3. Pain Management: Tylenol as needed             -no pain at present 4. Mood: BuSpar 15 mg twice a day, Klonopin 0.25 mg 3 times daily as needed             -may need further medication adjustment             -continued egosupport, environmental mod 5. Neuropsych: This patient is capable of making decisions on his own behalf. 6. Skin/Wound Care: Routine skin checks, diarrhea, , we will order diflucan po 7.  Fluids/Electrolytes/Nutrition: Routine I&O follow-up chemistries             -adjust h2o flushes as indicated             -replace potassium, need to monitor closely given diarrhea 8. Dysphagia. Nothing by mouth. Nasogastric tube in place. Follow-up speech therapy, MBS today for advancement             -pt is high aspiration/pneumonia risk given dysphagia 9. Healthcare associated pneumonia as well as history of pulmonary fibrosis. Continue Levaquin. Chronic oxygen as directed, was on 2L at rest at home and 4L with exercise             -aspiration precautions             -afebrile at present 10. Hyperlipidemia. Lopid/Pravachol 11. Atrial fibrillation:             -HR controlled at present, appropriately elevated with exercise, HR 91 monitor             -eliquis--has had some epistaxis no rucurrent episode, no blood in stools reported 12.  Diarrhea, stool for C diff is neg, has Crohn's, unable to give pentasa CR due to NGT, consider rectal supp, schedule lomotil, likely combination between Crohns and TF +/- antibiotic associated add probiotic, will try to advance to by mouth but if this is not possible may need gastroenterology to advise whether Crohn's needs to be more aggressively treated, no improvement with Lomotil, will trial Questran, if MBS ok would d/c TF 13.  Reviewed labs personally, elevated WBC likely residual from HCAP, Crohns may also affect, monitor for fever , BMET normal except elevated glucose  which is TF related LOS (Days) 7 A FACE TO FACE EVALUATION WAS PERFORMED  Brian [name] 02/09/2015, 7:12 AM

## 2015-02-09 NOTE — Progress Notes (Signed)
Physical Therapy Session Note  Patient Details  Name: Brian [name] MRN: [id number] Date of Birth: 10-25-1928  Today's Date: 02/09/2015 PT Individual Time: 1500-1535 PT Individual Time Calculation (min): 35 min   Short Term Goals: Week 1:  PT Short Term Goal 1 (Week 1): STGs = LTGs due to ELOS  Skilled Therapeutic Interventions/Progress Updates:    Pt received seated in bed, no c/o pain and agreeable to treatment. Sit >supine from [facility] elevated with S. Gait with CGA x175' to simulated apartment. Seated on couch and O2 assessed 88% after 1-2 min rest break while retrieving dynamap. Cued pt for deep breathing and increased to >95% within 2-3 min. Gait into gym with CGA x75'. Seated on edge of mat table with vitals assessed at 89%. Pt educated in incentive spirometer and performed 2 sets 5 breaths with max volume 65mL, improved over 250-352mL on initial trial after several reps for motor learning. Educated family on use of device an encouraged to perform once an hour, and given sheet for tracking max volume to track progress over time. Pt returned to room in w/c totalA for energy conservation. Stand pivot w/c >recliner with CGA. Remained seated in recliner with family present and all needs within reach.   Therapy Documentation Precautions:  Precautions Precautions: Fall Precaution Comments: NPO; aphasic Restrictions Weight Bearing Restrictions: No Pain: Pain Assessment Pain Assessment: No/denies pain Pain Score: 0-No pain   See Function Navigator for Current Functional Status.   Therapy/Group: Individual Therapy  Luberta Mutter 02/09/2015, 3:41 PM

## 2015-02-09 NOTE — Progress Notes (Signed)
Physical Therapy Note  Patient Details  Name: Brian [name] MRN: [id number] Date of Birth: 01-06-1929 Today's Date: 02/09/2015    Time: [id number] 45 minutes  1:1 No c/o pain. Pt able to dress himself with set up EOB and min A for standing to don pants.  Pt performed gait 2 x 160' with HHA with cues for deep breathing. SpO2 87% after gait, improved to >90% after 2 minutes rest. Pt verbalized need to use the restroom. Pt performed clothing up/down with supervision, able to perform hygiene without assist.  Furniture transfers to chair, sofa and bed with supervision, min A last attempt due to fatigue.  Pt with more SOB today, spO2 87-93% on 4L O2.   [name] 02/09/2015, 9:38 AM

## 2015-02-09 NOTE — Progress Notes (Signed)
Occupational Therapy Weekly Progress Note  Patient Details  Name: Brian [name] MRN: [id number] Date of Birth: 1929/03/09  Beginning of progress report period: February 03, 2015 End of progress report period: February 09, 2015  Today's Date: 02/09/2015 OT Individual Time: 1105-1200 OT Individual Time Calculation (min): 55 min    Patient has met 0 of 3 short term goals.  Pt still needs min to min guard assist for mobility without assistive device during functional transfers to and from the toilet without an assistive device.  Min guard assist for sit to stand with LB selfcare and with bathing tasks as well as grooming tasks in standing.  Brian [name] continues to need mod instructional cueing to sequence through bathing tasks for safety but can complete dressing tasks with min instructional cueing at times.  Oxygen sats are continuing to drop with activity, even when being placed on 6Ls nasal cannula.  Sats will decrease to 84-85 % with standing tasks and functional mobility.  Feel he is on target to reach supervision level goals for discharge home next week.  Will continue to follow until expected discharge in order to continue progression of independence with balance and ADL function.  Pt's wife should be able to provide 24 hour supervision at discharge.    Patient continues to demonstrate the following deficits: decreased balance, decreased endurance, cardiorespiratory limitations, cognitive processing difficulties,  and therefore will continue to benefit from skilled OT intervention to enhance overall performance with BADL.  Patient progressing toward long term goals..  Continue plan of care.  OT Short Term Goals Week 2:  OT Short Term Goal 1 (Week 2): Continue working on supervision level LTGs.  Skilled Therapeutic Interventions/Progress Updates:    Pt completed bathing, dressing, and grooming tasks during session.  He was able to sequence removing dirty clothing with supervision during  toileting task.  He needed mod instructional cueing during shower to soap up with washcloth as initially he was just running the water over his body and not attempting use of washcloth until cued.  Even when cued about the washcloth he did not place soap on it, which he had completed more spontaneously during previous sessions.  Pt ambulated out to the bed for dressing.  He attempted to donn his shoes before donning his pants.  Max instructional cueing needed for pt to problem solve this error.  He donned all other clothing with supervision.  Finished session by having pt stand to shave at the sink with min guard assist.  He was able to sequencing plugging in the razor and using it but had difficulty problem solving how to place it completely back in the shaving bag.  Pt left in bed with wife and pt's son present.    Therapy Documentation Precautions:  Precautions Precautions: Fall Precaution Comments: NPO; aphasic Restrictions Weight Bearing Restrictions: No  Pain: Pain Assessment Pain Assessment: No/denies pain ADL: See Function Navigator for Current Functional Status.   Therapy/Group: Individual Therapy  Yvetta Drotar OTR/L 02/09/2015, 1:18 PM

## 2015-02-09 NOTE — Progress Notes (Signed)
MBS completed with recommendation for continued NPO due to significant oropharyngeal dysphagia.  PA made aware.  Complete report to follow and will be located under chart review in imaging.

## 2015-02-10 ENCOUNTER — Encounter (HOSPITAL_COMMUNITY): Payer: Self-pay

## 2015-02-10 ENCOUNTER — Inpatient Hospital Stay (HOSPITAL_COMMUNITY): Payer: Medicare Other | Admitting: Speech Pathology

## 2015-02-10 NOTE — Plan of Care (Signed)
Problem: RH BOWEL ELIMINATION Goal: RH STG MANAGE BOWEL WITH ASSISTANCE STG Manage Bowel with Mod Assistance.  Outcome: Not Progressing Patient requires total assist at this time to change brief

## 2015-02-10 NOTE — Progress Notes (Signed)
Subjective/Complaints: Difficult to assess due to aphasia, however, patient appears to state that he slept well overnight.  ROS cannot obtain due to aphasia  Objective: Vital Signs: Blood pressure 120/66, pulse 96, temperature 98.4 F (36.9 C), temperature source Oral, resp. rate 19, height 5\' 11"  (1.803 m), weight 62.3 kg (137 lb 5.6 oz), SpO2 97 %. Dg Swallowing Func-speech Pathology  02/09/2015  Objective Swallowing Evaluation: MBS-Modified Barium Swallow Study Patient Details Name: Brian [name] MRN: [id number] Date of Birth: 08/23/1928 Today's Date: 02/09/2015 Time: 0903 - 0930  Past Medical History: Past Medical History Diagnosis Date . Muscle tremor    saw neurology remotely elsewhere, was Rx inderal . Crohn's ileocolitis ([place])  . Osteopenia    per DEXA 11/2008 . Hypertension 11/11   mild . Hemorrhoids  . Hypertrophy of prostate    w/o UR obst & oth luts . Peripheral neuropathy ([facility])    h/o neg w/u . ED (erectile dysfunction)  . Hypertriglyceridemia  . COPD (chronic obstructive pulmonary disease) ([place])    recent dx--no acute problems . Hiatal hernia 1956   found while in service . Anxiety  . Atrial fibrillation with RVR ([place])  . Long term (current) use of anticoagulants 02/21/2011 . Serrated adenoma of colon 03/2010 . Renal cyst    bilateral . Pulmonary fibrosis ([place]) 2015   Rx Esbriet ~ 10-2013 ([name]), Dr. [name] . On home oxygen therapy    "2L; 20-24h for the past week" (01/26/2015) . Emphysema lung ([place]) 2016   Dr. [name] . B12 deficiency anemia  . Idiopathic pulmonary fibrosis ([place]) 2016   Dr. [name] . Pneumonia ?2015 . CAP (community acquired pneumonia) 01/26/2015 . Osteoarthritis    "maybe in my hands, feet" (01/26/2015) . Depression  . Squamous cell carcinoma of skin of scalp    tx'd ~ 81yrs; "froze them off" . Squamous cell carcinoma, face     "froze them off" . Melanoma of scalp [facility])  Past Surgical History: Past Surgical History Procedure Laterality Date . Colon resection  1984   resection of  distal ileum and cecum w/ appendectomy, 18-inch small intestine, for Crohn's disease . Colon surgery   . Appendectomy  1984 . Cataract extraction w/ intraocular lens  implant, bilateral Bilateral 2007 . Cholecystectomy  02/17/2011   Procedure: LAPAROSCOPIC CHOLECYSTECTOMY WITH INTRAOPERATIVE CHOLANGIOGRAM;  Surgeon: Edward Jolly, MD;  Location: [facility]S;  Service: General;  Laterality: N/A; . Mohs surgery Right ~ 2014   "side of my scalp" HPI: 81 y.o. male with pulmonary fibrosis, atrial fibrillation, Chron's, HTN, COPD, hiatal hernia and pna 01/26/15 admitted with right side weakness and inability to speak. CT negative, MRI showed acute nonhemorrhagic posterior left MCA territory infarct. CXR Slight worsening of RIGHT upper lobe pneumonia superimposed on emphysematous change. Made NPO per MBS 02/01/2015.  Repeat MBS ordered today to determine readiness for initiation of PO diet .   Recommendation/Prognosis Clinical Impression:  Therapy Diagnosis: Severe pharyngeal phase dysphagia Despite subjective improvements perceived at bedside, pt demonstrates little functional improvement from previous MBS and continues to present with a severe oropharyngeal neuromuscular dysphagia characterized by sensorimotor impairment. Reduced sensation is still present and results in premature spillage of materials into the pharynx.  Pharyngeal phase deficits are again characterized by reduced hyolaryngeal elevation resulting in reduced epiglottic deflection and delayed swallow initiation for puree and honey thick liquids via teaspoon.  Delayed swallow initiation and sensory deficits resulted in unsensed aspiration of honey thick liquids. Pt continues to have significant pharyngeal residue across all consistencies.  Vallecular residue is greater than pyriform sinus residue.  Pt was better able to utilize a chin tuck and extra swallows in comparison to previous swallow study; however, despite use of compensatory strategies, pt  continues to demonstrate ineffective transit of boluses through the esophagus due to weakness and SLP had to cue pt for "hocking" to orally expectorate small amounts of pharyngeal residue. After study ended, pt was noted with wet vocal quality and congested, wet cough which SLP suspects to be related to delayed sensation of remaining pharyngeal residuals entering the airway.   SLP recommends that pt remain NPO with trials of ice chips following oral care with SLP only.  SLP will continue to f/u for intensive dysphagia treatment with PO trials.  SLP provided skilled education with pt's wife and son after completion of study regarding results and recommendations.  All family's questions were answered to their satisfaction at this time.   Swallow Evaluation Recommendations:  SLP Diet Recommendations: NPO Medication Administration: Via alternative means    Individuals Consulted: Consulted and Agree with Results and Recommendations: Patient;Family member/caregiver Family Member Consulted: wife and son  SLP Assessment/Plan General: Date of Onset: 01/26/15 Type of Study: MBS-Modified Barium Swallow Study Previous Swallow Assessment: MBS this admission recommending NP) Diet Prior to this Study: NPO;NG Tube Temperature Spikes Noted: No History of Recent Intubation: No Oral Cavity - Dentition: Edentulous Self-Feeding Abilities: Able to feed self Baseline Vocal Quality: Normal Volitional Cough: Strong Anatomy: Within functional limits    Oral Phase: Oral Preparation/Oral Phase Oral Phase: Within functional limits  Pharyngeal Phase:  Pharyngeal Phase Pharyngeal Phase: Impaired Pharyngeal - Honey Pharyngeal- Honey Teaspoon: Delayed swallow initiation;Swallow initiation at pyriform sinus;Reduced airway/laryngeal closure;Reduced anterior laryngeal mobility;Reduced epiglottic inversion;Reduced laryngeal elevation;Penetration/Aspiration during swallow;Moderate aspiration;Pharyngeal residue - pyriform;Pharyngeal residue - valleculae  Pharyngeal: Material enters airway, passes BELOW cords without attempt by patient to eject out (silent aspiration) Pharyngeal - Solids Pharyngeal- Puree: Swallow initiation at vallecula;Reduced laryngeal elevation;Reduced airway/laryngeal closure;Reduced epiglottic inversion;Reduced anterior laryngeal mobility;Pharyngeal residue - pyriform;Pharyngeal residue - valleculae     GN     Page, Selinda Orion 02/09/2015, 12:14 PM  No results found for this or any previous visit (from the past 72 hour(s)).   Gen NAD. Vital signs reviewed HEENT: Normocephalic, atraumatic Cardio: irregular. Normal rate. Resp: CTA B/L and unlabored GI: BS positive and NT, ND. +NGT Extremity:  No Edema Skin:   No new skin lesions on visible skin. Neuro: Unable to assess strength and sensation due to aphasia Other receptive and exp language deficits Musc/Skel:  No pain ROM of UE or LE. No edema  Assessment/Plan: 1. Functional deficits secondary to Left MCA which require 3+ hours per day of interdisciplinary therapy in a comprehensive inpatient rehab setting. Physiatrist is providing close team supervision and 24 hour management of active medical problems listed below. Physiatrist and rehab team continue to assess barriers to discharge/monitor patient progress toward functional and medical goals.  FIM: Function - Bathing Position: Shower Body parts bathed by patient: Right arm, Left arm, Chest, Abdomen, Front perineal area, Buttocks, Right upper leg, Back, Left lower leg, Right lower leg, Left upper leg Assist Level: Touching or steadying assistance(Pt > 75%)  Function- Upper Body Dressing/Undressing What is the patient wearing?: Pull over shirt/dress Pull over shirt/dress - Perfomed by patient: Thread/unthread right sleeve, Thread/unthread left sleeve, Pull shirt over trunk, Put head through opening Pull over shirt/dress - Perfomed by helper: Put head through opening Assist Level: Supervision or verbal cues Set up : To  obtain  clothing/put away Function - Lower Body Dressing/Undressing What is the patient wearing?: Pants, Socks, Shoes, Underwear Position: Other (comment) (bedside chair) Underwear - Performed by patient: Thread/unthread right underwear leg, Thread/unthread left underwear leg, Pull underwear up/down Pants- Performed by patient: Thread/unthread right pants leg, Thread/unthread left pants leg, Pull pants up/down, Fasten/unfasten pants Socks - Performed by patient: Don/doff right sock, Don/doff left sock Shoes - Performed by patient: Don/doff right shoe, Don/doff left shoe, Fasten right, Fasten left Assist for lower body dressing: Touching or steadying assistance (Pt > 75%)  Function - Toileting Toileting activity did not occur: No continent bowel/bladder event Toileting steps completed by patient: Performs perineal hygiene, Adjust clothing after toileting, Adjust clothing prior to toileting Assist level: Touching or steadying assistance (Pt.75%)  Function - Air cabin crew transfer activity did not occur: N/A Toilet transfer assistive device: Elevated toilet seat/BSC over toilet, Grab bar Assist level to toilet: Touching or steadying assistance (Pt > 75%) Assist level from toilet: Touching or steadying assistance (Pt > 75%)  Function - Chair/bed transfer Chair/bed transfer method: Ambulatory Chair/bed transfer assist level: Touching or steadying assistance (Pt > 75%) Chair/bed transfer assistive device: Armrests Chair/bed transfer details: Verbal cues for precautions/safety, Verbal cues for technique  Function - Locomotion: Wheelchair Will patient use wheelchair at discharge?: No Type: Manual Max wheelchair distance: 150 Assist Level: Supervision or verbal cues Assist Level: Supervision or verbal cues Wheel 150 feet activity did not occur: Safety/medical concerns Assist Level: Supervision or verbal cues Turns around,maneuvers to table,bed, and toilet,negotiates 3%  grade,maneuvers on rugs and over doorsills: No Function - Locomotion: Ambulation Assistive device: Hand held assist Max distance: 175 Assist level: Touching or steadying assistance (Pt > 75%) Assist level: Touching or steadying assistance (Pt > 75%) Walk 50 feet with 2 turns activity did not occur: Safety/medical concerns Assist level: Touching or steadying assistance (Pt > 75%) Walk 150 feet activity did not occur: Safety/medical concerns Assist level: Touching or steadying assistance (Pt > 75%) Walk 10 feet on uneven surfaces activity did not occur: Safety/medical concerns Assist level: Maximal assist (Pt 25 - 49%) (LOB backwards)  Function - Comprehension Comprehension: Auditory Comprehension assist level: Understands basic 75 - 89% of the time/ requires cueing 10 - 24% of the time  Function - Expression Expression: Verbal Expression assist level: Expresses basic 25 - 49% of the time/requires cueing 50 - 75% of the time. Uses single words/gestures.  Function - Social Interaction Social Interaction assist level: Interacts appropriately 75 - 89% of the time - Needs redirection for appropriate language or to initiate interaction.  Function - Problem Solving Problem solving assist level: Solves basic 50 - 74% of the time/requires cueing 25 - 49% of the time  Function - Memory Memory assist level: Recognizes or recalls 25 - 49% of the time/requires cueing 50 - 75% of the time Patient normally able to recall (first 3 days only): None of the above  Medical Problem List and Plan: 1. Functional deficits secondary to left MCA embolic infarct  - Continue CIR 2.  DVT Prophylaxis/Anticoagulation: Eliquis. Monitor CBC and BMET 3. Pain Management: Tylenol as needed 4. Mood: BuSpar 15 mg twice a day, Klonopin 0.25 mg 3 times daily as needed             -may need further medication adjustment             -continued egosupport, environmental mod 5. Neuropsych: This patient is not capable of  making decisions on his own behalf. 6. Skin/Wound  Care: Routine skin checks 7. Fluids/Electrolytes/Nutrition: Routine I&O follow-up chemistries             -adjust h2o flushes as indicated             -replaced potassium  -Diarrhea improved 8. Dysphagia. Nothing by mouth. Nasogastric tube in place. Follow-up speech therapy, patient failed most recent swallow. Continue nothing by mouth              -pt is high aspiration/pneumonia risk given dysphagia 9. Healthcare associated pneumonia as well as history of pulmonary fibrosis. Continue Levaquin. Chronic oxygen as directed, was on 2L at rest at home and 4L with exercise             -aspiration precautions             -afebrile at present 10. Hyperlipidemia. Lopid/Pravachol 11. Atrial fibrillation:             -HR controlled at present, appropriately elevated with exercise, HR 96 this AM             -eliquis--has had some epistaxis no rucurrent episode, no blood in stools reported 12.  Diarrhea, stool for C diff is neg, has Crohn's, unable to give pentasa CR due to NGT, likely combination between Crohns and TF +/- antibiotic associated add probiotic. Improved. Will consider gastroenterology to advise whether Crohn's needs to be more aggressively treated 13.  Reviewed labs personally, elevated WBC likely residual from HCAP, Crohns may also affect, monitor for fever , afebrile.   - BMET normal except elevated glucose which is TF related  LOS (Days) 8 A FACE TO FACE EVALUATION WAS PERFORMED  Ankit Lorie Phenix 02/10/2015, 9:03 AM

## 2015-02-10 NOTE — Progress Notes (Signed)
Speech Language Pathology Daily Session Note  Patient Details  Name: Brian [name] MRN: [id number] Date of Birth: 1929-01-04  Today's Date: 02/10/2015 SLP Individual Time: 1440-1530 SLP Individual Time Calculation (min): 50 min  Short Term Goals: Week 1: SLP Short Term Goal 1 (Week 1): Pt will consume therapeutic ice chip trials with minimal overt signs of aspiration across two therapy sessions to determine readiness for repeat MBS SLP Short Term Goal 2 (Week 1): Pt will utilize multimodal communication methods to communicate basic wants/needs 75% of the time with Max cues SLP Short Term Goal 3 (Week 1): Pt will follow one-step commands with 75% accuracy with Max cues within basic, familiar tasks  Skilled Therapeutic Interventions: Skilled treatment session focused on addressing speech and swallow goals. SLP facilitated session with functional tasks and discussions to elicit automatic and basic verbal expression.  Son present and provided family pictures of which patient was able to name 3/10 spontaneously.  Patient with recognition of people and difficulty with task when he was unable to name them.  Patient also participated in drill work for counting 1-10 with Mod assist multimodal cues for awareness and correction of verbal errors.  SLP also facilitated session with introduction to pharyngeal strengthening exercises, which patient son and wife completed with SLP.  Patient initially required Max assist multimodal cues; however, once he understood task he was able to complete 4 swallows during a towel hold with chin tuck posture to increase focus on pharyngeal strengthening.  Wife reports that she will help patient complete 5 reps 4 times a day.  SLP to follow up for accuracy.    Function:  Cognition Comprehension Comprehension assist level: Understands basic 50 - 74% of the time/ requires cueing 25 - 49% of the time  Expression   Expression assist level: Expresses basic 25 - 49% of the  time/requires cueing 50 - 75% of the time. Uses single words/gestures.  Social Interaction Social Interaction assist level: Interacts appropriately 75 - 89% of the time - Needs redirection for appropriate language or to initiate interaction.  Problem Solving Problem solving assist level: Solves basic 50 - 74% of the time/requires cueing 25 - 49% of the time  Memory Memory assist level: Recognizes or recalls 50 - 74% of the time/requires cueing 25 - 49% of the time    Pain Pain Assessment Pain Assessment: No/denies pain  Therapy/Group: Individual Therapy  Carmelia Roller., CCC-SLP [id number]  [place] 02/10/2015, 4:07 PM

## 2015-02-11 ENCOUNTER — Inpatient Hospital Stay (HOSPITAL_COMMUNITY): Payer: Self-pay | Admitting: Physical Therapy

## 2015-02-11 DIAGNOSIS — I482 Chronic atrial fibrillation: Secondary | ICD-10-CM

## 2015-02-11 NOTE — Progress Notes (Signed)
Physical Therapy Session Note  Patient Details  Name: Brian [name] MRN: [id number] Date of Birth: 1928-07-04  Today's Date: 02/11/2015 PT Individual Time: 1015-1100 PT Individual Time Calculation (min): 45 min   Short Term Goals: Week 2:     Skilled Therapeutic Interventions/Progress Updates:   Pt received sitting in recliner, wife present, both agreeable to therapy.  Skilled session focused on hands on family training with wife assisting with gait and bed mobility as needed, functional endurance and activity to address aphasia via sorting and reading.  Pt ambulated from room to ADL apt at min A level (HHA from wife).  Wife doing well cuing pt for looking ahead of him instead of looking down.  While in ADL apt, performed bed mobility to regular bed to simulate home environment.  Performed at S level, min cues to initiate task.  Once at EOB, SaO2 checked and was 93%, HR 101.  Ambulated remainder of distance to therapy gym to nustep.  Performed seated nustep for 6 mins with rest break at 3 mins to check SaO2, sats were 87% therefore allowed increased time with wife assisting for cues on pursed lip breathing.  Once sats back to low 90's continued to 6 min mark.  Again, allowed increased time following task to allow SaO2 to increase back to 90's from 86-87% on 6LO2.  Performed stairs with B handrails to better simulate community re-entry at min/guard level.  Cues to wife on where to assist safely.  Ended session with bean bag toss while having pt attempt to sort via "breakfast" item or "dinner" item.  Pt able to read approx 60% of bean bags, however needed total A cues to toss to correct pile.  Ambulated back to room as above.  Left in recliner with wife present and all needs in reach.    Therapy Documentation Precautions:  Precautions Precautions: Fall Precaution Comments: NPO; aphasic Restrictions Weight Bearing Restrictions: No  Pain: Pain Assessment Pain Assessment: No/denies pain   See  Function Navigator for Current Functional Status.   Therapy/Group: Individual Therapy  Denice Bors 02/11/2015, 10:35 AM

## 2015-02-11 NOTE — Plan of Care (Signed)
Problem: RH BOWEL ELIMINATION Goal: RH STG MANAGE BOWEL WITH ASSISTANCE STG Manage Bowel with Mod Assistance.  Outcome: Not Progressing Total assist Goal: RH STG MANAGE BOWEL W/MEDICATION W/ASSISTANCE STG Manage Bowel with Medication with Mod Assistance.  Outcome: Not Progressing Total assist  Problem: RH BLADDER ELIMINATION Goal: RH STG MANAGE BLADDER WITH ASSISTANCE STG Manage Bladder With Mod Assistance  Outcome: Not Progressing Total assist

## 2015-02-11 NOTE — Progress Notes (Signed)
Subjective/Complaints: Patient seen and examined this morning sitting up in bed receiving his tube feeds. Patient constantly wants to shake my hand.  ROS cannot obtain due to aphasia  Objective: Vital Signs: Blood pressure 124/71, pulse 101, temperature 97.7 F (36.5 C), temperature source Oral, resp. rate 17, height 5\' 11"  (1.803 m), weight 59.1 kg (130 lb 4.7 oz), SpO2 94 %. Dg Swallowing Func-speech Pathology  02/09/2015  Objective Swallowing Evaluation: MBS-Modified Barium Swallow Study Patient Details Name: QUANTAVIOUS SCHILLO MRN: [id number] Date of Birth: 06-17-1928 Today's Date: 02/09/2015 Time: 0903 - 0930  Past Medical History: Past Medical History Diagnosis Date . Muscle tremor    saw neurology remotely elsewhere, was Rx inderal . Crohn's ileocolitis ([place])  . Osteopenia    per DEXA 11/2008 . Hypertension 11/11   mild . Hemorrhoids  . Hypertrophy of prostate    w/o UR obst & oth luts . Peripheral neuropathy ([facility])    h/o neg w/u . ED (erectile dysfunction)  . Hypertriglyceridemia  . COPD (chronic obstructive pulmonary disease) ([place])    recent dx--no acute problems . Hiatal hernia 1956   found while in service . Anxiety  . Atrial fibrillation with RVR ([place])  . Long term (current) use of anticoagulants 02/21/2011 . Serrated adenoma of colon 03/2010 . Renal cyst    bilateral . Pulmonary fibrosis ([place]) 2015   Rx Esbriet ~ 10-2013 ([name]), Dr. [name] . On home oxygen therapy    "2L; 20-24h for the past week" (01/26/2015) . Emphysema lung ([place]) 2016   Dr. [name] . B12 deficiency anemia  . Idiopathic pulmonary fibrosis ([place]) 2016   Dr. [name] . Pneumonia ?2015 . CAP (community acquired pneumonia) 01/26/2015 . Osteoarthritis    "maybe in my hands, feet" (01/26/2015) . Depression  . Squamous cell carcinoma of skin of scalp    tx'd ~ 6yrs; "froze them off" . Squamous cell carcinoma, face     "froze them off" . Melanoma of scalp [facility])  Past Surgical History: Past Surgical History Procedure Laterality Date . Colon  resection  1984   resection of distal ileum and cecum w/ appendectomy, 18-inch small intestine, for Crohn's disease . Colon surgery   . Appendectomy  1984 . Cataract extraction w/ intraocular lens  implant, bilateral Bilateral 2007 . Cholecystectomy  02/17/2011   Procedure: LAPAROSCOPIC CHOLECYSTECTOMY WITH INTRAOPERATIVE CHOLANGIOGRAM;  Surgeon: Edward Jolly, MD;  Location: [facility]S;  Service: General;  Laterality: N/A; . Mohs surgery Right ~ 2014   "side of my scalp" HPI: 81 y.o. male with pulmonary fibrosis, atrial fibrillation, Chron's, HTN, COPD, hiatal hernia and pna 01/26/15 admitted with right side weakness and inability to speak. CT negative, MRI showed acute nonhemorrhagic posterior left MCA territory infarct. CXR Slight worsening of RIGHT upper lobe pneumonia superimposed on emphysematous change. Made NPO per MBS 02/01/2015.  Repeat MBS ordered today to determine readiness for initiation of PO diet .   Recommendation/Prognosis Clinical Impression:  Therapy Diagnosis: Severe pharyngeal phase dysphagia Despite subjective improvements perceived at bedside, pt demonstrates little functional improvement from previous MBS and continues to present with a severe oropharyngeal neuromuscular dysphagia characterized by sensorimotor impairment. Reduced sensation is still present and results in premature spillage of materials into the pharynx.  Pharyngeal phase deficits are again characterized by reduced hyolaryngeal elevation resulting in reduced epiglottic deflection and delayed swallow initiation for puree and honey thick liquids via teaspoon.  Delayed swallow initiation and sensory deficits resulted in unsensed aspiration of honey thick liquids. Pt continues to have significant  pharyngeal residue across all consistencies.  Vallecular residue is greater than pyriform sinus residue.  Pt was better able to utilize a chin tuck and extra swallows in comparison to previous swallow study; however, despite use of  compensatory strategies, pt continues to demonstrate ineffective transit of boluses through the esophagus due to weakness and SLP had to cue pt for "hocking" to orally expectorate small amounts of pharyngeal residue. After study ended, pt was noted with wet vocal quality and congested, wet cough which SLP suspects to be related to delayed sensation of remaining pharyngeal residuals entering the airway.   SLP recommends that pt remain NPO with trials of ice chips following oral care with SLP only.  SLP will continue to f/u for intensive dysphagia treatment with PO trials.  SLP provided skilled education with pt's wife and son after completion of study regarding results and recommendations.  All family's questions were answered to their satisfaction at this time.   Swallow Evaluation Recommendations:  SLP Diet Recommendations: NPO Medication Administration: Via alternative means    Individuals Consulted: Consulted and Agree with Results and Recommendations: Patient;Family member/caregiver Family Member Consulted: wife and son  SLP Assessment/Plan General: Date of Onset: 01/26/15 Type of Study: MBS-Modified Barium Swallow Study Previous Swallow Assessment: MBS this admission recommending NP) Diet Prior to this Study: NPO;NG Tube Temperature Spikes Noted: No History of Recent Intubation: No Oral Cavity - Dentition: Edentulous Self-Feeding Abilities: Able to feed self Baseline Vocal Quality: Normal Volitional Cough: Strong Anatomy: Within functional limits    Oral Phase: Oral Preparation/Oral Phase Oral Phase: Within functional limits  Pharyngeal Phase:  Pharyngeal Phase Pharyngeal Phase: Impaired Pharyngeal - Honey Pharyngeal- Honey Teaspoon: Delayed swallow initiation;Swallow initiation at pyriform sinus;Reduced airway/laryngeal closure;Reduced anterior laryngeal mobility;Reduced epiglottic inversion;Reduced laryngeal elevation;Penetration/Aspiration during swallow;Moderate aspiration;Pharyngeal residue -  pyriform;Pharyngeal residue - valleculae Pharyngeal: Material enters airway, passes BELOW cords without attempt by patient to eject out (silent aspiration) Pharyngeal - Solids Pharyngeal- Puree: Swallow initiation at vallecula;Reduced laryngeal elevation;Reduced airway/laryngeal closure;Reduced epiglottic inversion;Reduced anterior laryngeal mobility;Pharyngeal residue - pyriform;Pharyngeal residue - valleculae     GN     Page, Selinda Orion 02/09/2015, 12:14 PM  No results found for this or any previous visit (from the past 72 hour(s)).   Gen NAD. Vital signs reviewed HEENT: Normocephalic, atraumatic Cardio: irregular. Normal rate. Resp: CTA B/L and unlabored GI: BS positive and NT, ND. +NGT Skin:   No new skin lesions on visible skin. Neuro: Unable to assess strength and sensation due to aphasia Receptive and exp language deficits without repitition Musc/Skel:  No pain ROM of UE or LE. No edema  Assessment/Plan: 1. Functional deficits secondary to Left MCA which require 3+ hours per day of interdisciplinary therapy in a comprehensive inpatient rehab setting. Physiatrist is providing close team supervision and 24 hour management of active medical problems listed below. Physiatrist and rehab team continue to assess barriers to discharge/monitor patient progress toward functional and medical goals.  FIM: Function - Bathing Position: Sitting EOB Body parts bathed by patient: Right arm, Left arm, Abdomen, Chest, Front perineal area, Right upper leg, Left upper leg, Right lower leg, Left lower leg Body parts bathed by helper: Back, Buttocks Assist Level: Touching or steadying assistance(Pt > 75%)  Function- Upper Body Dressing/Undressing What is the patient wearing?: Pull over shirt/dress Pull over shirt/dress - Perfomed by patient: Thread/unthread right sleeve, Thread/unthread left sleeve, Put head through opening, Pull shirt over trunk Pull over shirt/dress - Perfomed by helper: Put head  through opening Assist Level:  Supervision or verbal cues, Set up Set up : To obtain clothing/put away Function - Lower Body Dressing/Undressing What is the patient wearing?: Pants Position: Sitting EOB Underwear - Performed by patient: Thread/unthread right underwear leg, Thread/unthread left underwear leg, Pull underwear up/down Pants- Performed by patient: Pull pants up/down Pants- Performed by helper: Thread/unthread left pants leg, Thread/unthread right pants leg Socks - Performed by patient: Don/doff right sock, Don/doff left sock Socks - Performed by helper: Don/doff right sock, Don/doff left sock Shoes - Performed by patient: Don/doff right shoe, Don/doff left shoe, Fasten right, Fasten left Assist for lower body dressing: Touching or steadying assistance (Pt > 75%)  Function - Toileting Toileting activity did not occur: No continent bowel/bladder event Toileting steps completed by patient: Performs perineal hygiene, Adjust clothing after toileting, Adjust clothing prior to toileting Assist level: Touching or steadying assistance (Pt.75%)  Function - Air cabin crew transfer activity did not occur: N/A Toilet transfer assistive device: Bedside commode Assist level to toilet: Touching or steadying assistance (Pt > 75%) Assist level from toilet: Touching or steadying assistance (Pt > 75%) Assist level to bedside commode (at bedside): Supervision or verbal cues Assist level from bedside commode (at bedside): Supervision or verbal cues  Function - Chair/bed transfer Chair/bed transfer method: Ambulatory Chair/bed transfer assist level: Touching or steadying assistance (Pt > 75%) Chair/bed transfer assistive device: Armrests Chair/bed transfer details: Verbal cues for precautions/safety, Verbal cues for technique  Function - Locomotion: Wheelchair Will patient use wheelchair at discharge?: No Type: Manual Max wheelchair distance: 150 Assist Level: Supervision or verbal  cues Assist Level: Supervision or verbal cues Wheel 150 feet activity did not occur: Safety/medical concerns Assist Level: Supervision or verbal cues Turns around,maneuvers to table,bed, and toilet,negotiates 3% grade,maneuvers on rugs and over doorsills: No Function - Locomotion: Ambulation Assistive device: Hand held assist Max distance: 175 Assist level: Touching or steadying assistance (Pt > 75%) Assist level: Touching or steadying assistance (Pt > 75%) Walk 50 feet with 2 turns activity did not occur: Safety/medical concerns Assist level: Touching or steadying assistance (Pt > 75%) Walk 150 feet activity did not occur: Safety/medical concerns Assist level: Touching or steadying assistance (Pt > 75%) Walk 10 feet on uneven surfaces activity did not occur: Safety/medical concerns Assist level: Maximal assist (Pt 25 - 49%) (LOB backwards)  Function - Comprehension Comprehension: Auditory Comprehension assist level: Understands basic 50 - 74% of the time/ requires cueing 25 - 49% of the time  Function - Expression Expression: Verbal Expression assist level: Expresses basic 25 - 49% of the time/requires cueing 50 - 75% of the time. Uses single words/gestures.  Function - Social Interaction Social Interaction assist level: Interacts appropriately 75 - 89% of the time - Needs redirection for appropriate language or to initiate interaction.  Function - Problem Solving Problem solving assist level: Solves basic 50 - 74% of the time/requires cueing 25 - 49% of the time  Function - Memory Memory assist level: Recognizes or recalls 50 - 74% of the time/requires cueing 25 - 49% of the time Patient normally able to recall (first 3 days only): None of the above  Medical Problem List and Plan: 1. Functional deficits secondary to left MCA embolic infarct  - Continue CIR 2.  DVT Prophylaxis/Anticoagulation: Eliquis. Monitor CBC and BMET 3. Pain Management: Tylenol as needed 4. Mood: BuSpar  15 mg twice a day, Klonopin 0.25 mg 3 times daily as needed             -may  need further medication adjustment             -continued egosupport, environmental mod 5. Neuropsych: This patient is not capable of making decisions on his own behalf. 6. Skin/Wound Care: Routine skin checks 7. Fluids/Electrolytes/Nutrition: Routine I&O follow-up chemistries             -adjust h2o flushes as indicated             -replaced potassium  -Diarrhea improved 8. Dysphagia. Nothing by mouth. Nasogastric tube in place. Follow-up speech therapy, patient failed most recent swallow. Continue nothing by mouth              -pt is high aspiration/pneumonia risk given dysphagia  - Tolerating well 9. Healthcare associated pneumonia as well as history of pulmonary fibrosis. Continue Levaquin. Chronic oxygen as directed, was on 2L at rest at home and 4L with exercise             -aspiration precautions             -afebrile at present 10. Hyperlipidemia. Lopid/Pravachol 11. Atrial fibrillation:             -HR relatively well controlled at present, appropriately elevated with exercise, HR 101 this AM             -eliquis--has had some epistaxis no rucurrent episode, no blood in stools reported 12.  Diarrhea, stool for C diff is neg, has Crohn's, unable to give pentasa CR due to NGT, likely combination between Crohns and TF +/- antibiotic associated add probiotic. Improved. Will consider gastroenterology to advise whether Crohn's needs to be more aggressively treated 13.  Reviewed labs personally, elevated WBC likely residual from HCAP, Crohns may also affect, monitor for fever , afebrile.   - BMET normal except elevated glucose which is TF related  LOS (Days) 9 A FACE TO FACE EVALUATION WAS PERFORMED  Eshan Trupiano Lorie Phenix 02/11/2015, 8:22 AM

## 2015-02-12 ENCOUNTER — Inpatient Hospital Stay (HOSPITAL_COMMUNITY): Payer: Self-pay | Admitting: Occupational Therapy

## 2015-02-12 ENCOUNTER — Inpatient Hospital Stay (HOSPITAL_COMMUNITY): Payer: Medicare Other | Admitting: *Deleted

## 2015-02-12 ENCOUNTER — Inpatient Hospital Stay (HOSPITAL_COMMUNITY): Payer: Medicare Other | Admitting: Occupational Therapy

## 2015-02-12 ENCOUNTER — Inpatient Hospital Stay (HOSPITAL_COMMUNITY): Payer: Medicare Other | Admitting: Speech Pathology

## 2015-02-12 ENCOUNTER — Inpatient Hospital Stay (HOSPITAL_COMMUNITY): Payer: Medicare Other

## 2015-02-12 DIAGNOSIS — R1310 Dysphagia, unspecified: Secondary | ICD-10-CM

## 2015-02-12 LAB — PROTIME-INR
INR: 1.43 (ref 0.00–1.49)
PROTHROMBIN TIME: 17.5 s — AB (ref 11.6–15.2)

## 2015-02-12 LAB — CBC
HCT: 38.3 % — ABNORMAL LOW (ref 39.0–52.0)
Hemoglobin: 12.5 g/dL — ABNORMAL LOW (ref 13.0–17.0)
MCH: 31.7 pg (ref 26.0–34.0)
MCHC: 32.6 g/dL (ref 30.0–36.0)
MCV: 97.2 fL (ref 78.0–100.0)
PLATELETS: 318 10*3/uL (ref 150–400)
RBC: 3.94 MIL/uL — AB (ref 4.22–5.81)
RDW: 12.8 % (ref 11.5–15.5)
WBC: 13.6 10*3/uL — ABNORMAL HIGH (ref 4.0–10.5)

## 2015-02-12 LAB — APTT: aPTT: 37 seconds (ref 24–37)

## 2015-02-12 MED ORDER — GLUCAGON HCL RDNA (DIAGNOSTIC) 1 MG IJ SOLR
INTRAMUSCULAR | Status: AC | PRN
  Administered 2015-02-12: 1 mg via INTRAVENOUS

## 2015-02-12 MED ORDER — CEFAZOLIN SODIUM-DEXTROSE 2-3 GM-% IV SOLR
2.0000 g | INTRAVENOUS | Status: AC
  Administered 2015-02-12: 2 g via INTRAVENOUS
  Filled 2015-02-12: qty 50

## 2015-02-12 MED ORDER — LIDOCAINE HCL 1 % IJ SOLN
INTRAMUSCULAR | Status: AC
  Filled 2015-02-12: qty 20

## 2015-02-12 MED ORDER — GLUCAGON HCL RDNA (DIAGNOSTIC) 1 MG IJ SOLR
INTRAMUSCULAR | Status: AC
  Filled 2015-02-12: qty 1

## 2015-02-12 MED ORDER — IOHEXOL 300 MG/ML  SOLN
50.0000 mL | Freq: Once | INTRAMUSCULAR | Status: DC | PRN
  Administered 2015-02-12: 10 mL via INTRAVENOUS
  Filled 2015-02-12: qty 50

## 2015-02-12 MED ORDER — FENTANYL CITRATE (PF) 100 MCG/2ML IJ SOLN
INTRAMUSCULAR | Status: AC
  Filled 2015-02-12: qty 2

## 2015-02-12 MED ORDER — CHOLESTYRAMINE LIGHT 4 G PO PACK
4.0000 g | PACK | Freq: Two times a day (BID) | ORAL | Status: DC
  Administered 2015-02-12 – 2015-02-13 (×2): 4 g via ORAL
  Filled 2015-02-12 (×5): qty 1

## 2015-02-12 MED ORDER — CEFAZOLIN SODIUM-DEXTROSE 2-3 GM-% IV SOLR
INTRAVENOUS | Status: AC
  Administered 2015-02-12: 2 g via INTRAVENOUS
  Filled 2015-02-12: qty 50

## 2015-02-12 MED ORDER — MIDAZOLAM HCL 2 MG/2ML IJ SOLN
INTRAMUSCULAR | Status: AC
  Filled 2015-02-12: qty 2

## 2015-02-12 MED ORDER — FENTANYL CITRATE (PF) 100 MCG/2ML IJ SOLN
INTRAMUSCULAR | Status: AC | PRN
  Administered 2015-02-12 (×2): 25 ug via INTRAVENOUS

## 2015-02-12 MED ORDER — MIDAZOLAM HCL 2 MG/2ML IJ SOLN
INTRAMUSCULAR | Status: AC | PRN
  Administered 2015-02-12 (×2): 0.5 mg via INTRAVENOUS

## 2015-02-12 NOTE — Progress Notes (Signed)
Physical Therapy Session Note  Patient Details  Name: Brian [name] MRN: [id number] Date of Birth: 1928-03-27  Today's Date: 02/12/2015 PT Individual Time:8:50-9:30 (66min)      and Today's Date: 02/12/2015 PT Missed Time:  84min Missed Time Reason:  PEG tube placement     Skilled Therapeutic Interventions/Progress Updates:  RN came to report that pt would soon be taken for procedure, so tx was completed bedside for HEP instruction with wife present and handout provided. Wife was able to cue pt effectively, but he did need some tactile cues from PT for technique. Pt was instructed in [place] as well as seated HS stretch x60 sec bil. Pt performed sit<>stand transfers and basic mobility in the room with S only on supplemental 02. 02 sats dropped to low 80's on 6L tank, but rebounded within 1 min rest break. Wife was advised to take seated rests following each activity, which is what they do at home PTA. They have no further questions or concerns at this time.       Therapy Documentation Precautions:  Precautions Precautions: Fall Precaution Comments: NPO; aphasic Restrictions Weight Bearing Restrictions: No    Vital Signs: Therapy Vitals Pulse Rate: 92 Oxygen Therapy SpO2: 92 % O2 Device: Nasal Cannula O2 Flow Rate (L/min): 6 L/min Pain: none    Other Treatments:     See Function Navigator for Current Functional Status.   Therapy/Group: Individual Therapy Kennieth Rad, PT, DPT  02/12/2015, 9:17 AM

## 2015-02-12 NOTE — Sedation Documentation (Signed)
Timeout was actually done at 1027

## 2015-02-12 NOTE — Progress Notes (Signed)
2110 pt had urge to void and attempted in urinal with no result. Bladder scan pt with result of greater than 400cc. I&O cath pt for 350cc dark, amber urine with strong odor present. Will continue to monitor pt.

## 2015-02-12 NOTE — Progress Notes (Signed)
Speech Language Pathology Daily Session Note  Patient Details  Name: Brian [name] MRN: [id number] Date of Birth: 1928-05-30  Today's Date: 02/12/2015 SLP Individual Time: 64- 79   SLP Total Time: 62 Min   Short Term Goals: Week 1: SLP Short Term Goal 1 (Week 1): Pt will consume therapeutic ice chip trials with minimal overt signs of aspiration across two therapy sessions to determine readiness for repeat MBS SLP Short Term Goal 2 (Week 1): Pt will utilize multimodal communication methods to communicate basic wants/needs 75% of the time with Max cues SLP Short Term Goal 3 (Week 1): Pt will follow one-step commands with 75% accuracy with Max cues within basic, familiar tasks  Skilled Therapeutic Interventions:  Pt was seen for skilled ST targeting goals for dysphagia and communication.  SLP facilitated the session with trials of ice chips following thorough oral care to continue working towards initiation of PO diet.  Pt consumed ice chips with intermittently wet vocal quality and delayed, congested cough on ~50% of trials.  Pt also returned demonstration of pharyngeal strengthening exercises with max assist multimodal cues.  Pt was able to count from 1 to 10 with mod-max assist verbal cues to monitor and correct verbal errors; total assist to produce the letters of the alphabet and days of the week.  Pt was able to produce novel, personally meaningful words (ie pt's favorite sports teams) with mod assist multimodal cues.  Pt was left in bed with wife at bedside.  Continue per current plan of care.    Function:  Eating Eating   Modified Consistency Diet: No (NPO with PEG) Eating Assist Level: Supervision or verbal cues (with trials )           Cognition Comprehension Comprehension assist level: Understands basic 50 - 74% of the time/ requires cueing 25 - 49% of the time  Expression   Expression assist level: Expresses basic 25 - 49% of the time/requires cueing 50 - 75% of the time.  Uses single words/gestures.  Social Interaction Social Interaction assist level: Interacts appropriately 50 - 74% of the time - May be physically or verbally inappropriate.  Problem Solving Problem solving assist level: Solves basic 50 - 74% of the time/requires cueing 25 - 49% of the time  Memory Memory assist level: Recognizes or recalls 25 - 49% of the time/requires cueing 50 - 75% of the time    Pain Pain Assessment Pain Assessment: No/denies pain  Therapy/Group: Individual Therapy  Lyda Colcord, Selinda Orion 02/12/2015, 9:19 PM

## 2015-02-12 NOTE — Procedures (Signed)
Successful fluoroscopic guided insertion of gastrostomy tube.   The gastrostomy tube may be used immediately for medications.   Tube feeds may be initiated in 24 hours as per the primary team.   No immediate post procedural complications.   Jay Danyell Shader, MD Pager #: [phone]  

## 2015-02-12 NOTE — Plan of Care (Signed)
Problem: RH PAIN MANAGEMENT Goal: RH STG PAIN MANAGED AT OR BELOW PT'S PAIN GOAL <4 on a scale of 0-10  Outcome: Progressing No c/o pain

## 2015-02-12 NOTE — Progress Notes (Signed)
Subjective/Complaints: Per RN diarrhea improved, no BM overnite Reviewed SLP note as well as IR note  ROS cannot obtain due to aphasia  Objective: Vital Signs: Blood pressure 128/75, pulse 86, temperature 97.8 F (36.6 C), temperature source Oral, resp. rate 17, height 5\' 11"  (1.803 m), weight 59.2 kg (130 lb 8.2 oz), SpO2 96 %. No results found. Results for orders placed or performed during the hospital encounter of 02/02/15 (from the past 72 hour(s))  CBC     Status: Abnormal   Collection Time: 02/12/15  4:30 AM  Result Value Ref Range   WBC 13.6 (H) 4.0 - 10.5 K/uL   RBC 3.94 (L) 4.22 - 5.81 MIL/uL   Hemoglobin 12.5 (L) 13.0 - 17.0 g/dL   HCT 38.3 (L) 39.0 - 52.0 %   MCV 97.2 78.0 - 100.0 fL   MCH 31.7 26.0 - 34.0 pg   MCHC 32.6 30.0 - 36.0 g/dL   RDW 12.8 11.5 - 15.5 %   Platelets 318 150 - 400 K/uL  Protime-INR     Status: Abnormal   Collection Time: 02/12/15  4:30 AM  Result Value Ref Range   Prothrombin Time 17.5 (H) 11.6 - 15.2 seconds   INR 1.43 0.00 - 1.49  APTT     Status: None   Collection Time: 02/12/15  4:30 AM  Result Value Ref Range   aPTT 37 24 - 37 seconds    Comment:        IF BASELINE aPTT IS ELEVATED, SUGGEST PATIENT RISK ASSESSMENT BE USED TO DETERMINE APPROPRIATE ANTICOAGULANT THERAPY.      Gen NAD. Vital signs reviewed HEENT: Normocephalic, atraumatic Cardio: irregular. Normal rate. Resp: CTA B/L and unlabored GI: BS positive and NT, ND. +NGT Skin:   No new skin lesions on visible skin. Neuro: Unable to assess strength and sensation due to aphasia Receptive and exp language deficits without repitition Musc/Skel:  No pain ROM of UE or LE. No edema  Assessment/Plan: 1. Functional deficits secondary to Left MCA which require 3+ hours per day of interdisciplinary therapy in a comprehensive inpatient rehab setting. Physiatrist is providing close team supervision and 24 hour management of active medical problems listed below. Physiatrist and  rehab team continue to assess barriers to discharge/monitor patient progress toward functional and medical goals.  Planning D/C on Wed if he can be stabilized on bolus feeds by then, possibly chage D/C to [employer]  FIM: Function - Bathing Position: Sitting EOB Body parts bathed by patient: Right arm, Left arm, Abdomen, Chest, Front perineal area, Right upper leg, Left upper leg, Right lower leg, Left lower leg Body parts bathed by helper: Back, Buttocks Assist Level: Touching or steadying assistance(Pt > 75%)  Function- Upper Body Dressing/Undressing What is the patient wearing?: Pull over shirt/dress Pull over shirt/dress - Perfomed by patient: Thread/unthread right sleeve, Thread/unthread left sleeve, Put head through opening, Pull shirt over trunk Pull over shirt/dress - Perfomed by helper: Put head through opening Assist Level: Supervision or verbal cues, Set up Set up : To obtain clothing/put away Function - Lower Body Dressing/Undressing What is the patient wearing?: Pants Position: Sitting EOB Underwear - Performed by patient: Thread/unthread right underwear leg, Thread/unthread left underwear leg, Pull underwear up/down Pants- Performed by patient: Pull pants up/down Pants- Performed by helper: Thread/unthread left pants leg, Thread/unthread right pants leg Socks - Performed by patient: Don/doff right sock, Don/doff left sock Socks - Performed by helper: Don/doff right sock, Don/doff left sock Shoes - Performed by patient: Don/doff  right shoe, Don/doff left shoe, Fasten right, Fasten left Assist for lower body dressing: Touching or steadying assistance (Pt > 75%)  Function - Toileting Toileting activity did not occur: No continent bowel/bladder event Toileting steps completed by patient: Performs perineal hygiene, Adjust clothing after toileting, Adjust clothing prior to toileting Toileting steps completed by helper: Adjust clothing prior to toileting, Performs perineal hygiene,  Adjust clothing after toileting Assist level: Touching or steadying assistance (Pt.75%)  Function - Toilet Transfers Toilet transfer activity did not occur: N/A Toilet transfer assistive device: Bedside commode Assist level to toilet: Touching or steadying assistance (Pt > 75%) Assist level from toilet: Touching or steadying assistance (Pt > 75%) Assist level to bedside commode (at bedside): Supervision or verbal cues Assist level from bedside commode (at bedside): Supervision or verbal cues  Function - Chair/bed transfer Chair/bed transfer method: Ambulatory Chair/bed transfer assist level: Touching or steadying assistance (Pt > 75%) Chair/bed transfer assistive device: Armrests Chair/bed transfer details: Verbal cues for precautions/safety, Verbal cues for technique  Function - Locomotion: Wheelchair Will patient use wheelchair at discharge?: No Type: Manual Max wheelchair distance: 150 Assist Level: Supervision or verbal cues Assist Level: Supervision or verbal cues Wheel 150 feet activity did not occur: Safety/medical concerns Assist Level: Supervision or verbal cues Turns around,maneuvers to table,bed, and toilet,negotiates 3% grade,maneuvers on rugs and over doorsills: No Function - Locomotion: Ambulation Assistive device: Hand held assist Max distance: 175 Assist level: Touching or steadying assistance (Pt > 75%) Assist level: Touching or steadying assistance (Pt > 75%) Walk 50 feet with 2 turns activity did not occur: Safety/medical concerns Assist level: Touching or steadying assistance (Pt > 75%) Walk 150 feet activity did not occur: Safety/medical concerns Assist level: Touching or steadying assistance (Pt > 75%) Walk 10 feet on uneven surfaces activity did not occur: Safety/medical concerns Assist level: Maximal assist (Pt 25 - 49%) (LOB backwards)  Function - Comprehension Comprehension: Auditory Comprehension assist level: Follows basic conversation/direction  with no assist  Function - Expression Expression: Verbal Expression assist level: Expresses basic 75 - 89% of the time/requires cueing 10 - 24% of the time. Needs helper to occlude trach/needs to repeat words.  Function - Social Interaction Social Interaction assist level: Interacts appropriately 75 - 89% of the time - Needs redirection for appropriate language or to initiate interaction.  Function - Problem Solving Problem solving assist level: Solves basic 50 - 74% of the time/requires cueing 25 - 49% of the time  Function - Memory Memory assist level: Recognizes or recalls 50 - 74% of the time/requires cueing 25 - 49% of the time Patient normally able to recall (first 3 days only): None of the above  Medical Problem List and Plan: 1. Functional deficits secondary to left MCA embolic infarct  - Continue CIR 2.  DVT Prophylaxis/Anticoagulation: Eliquis. Monitor CBC and BMET 3. Pain Management: Tylenol as needed 4. Mood: BuSpar 15 mg twice a day, Klonopin 0.25 mg 3 times daily as needed             -may need further medication adjustment             -continued egosupport, environmental mod 5. Neuropsych: This patient is not capable of making decisions on his own behalf. 6. Skin/Wound Care: Routine skin checks 7. Fluids/Electrolytes/Nutrition: Routine I&O follow-up chemistries             -adjust h2o flushes as indicated             -replaced potassium  -  Diarrhea improved- cont Questran 8. Dysphagia. Nothing by mouth. Nasogastric tube in place. Follow-up speech therapy, patient failed most recent swallow. Continue nothing by mouth              -pt is high aspiration/pneumonia risk given dysphagia  - Scheduled for PEG in IR 9. Healthcare associated pneumonia as well as history of pulmonary fibrosis. completed Levaquin. Chronic oxygen as directed, was on 2L at rest at home and 4L with exercise             -aspiration precautions             -afebrile at present 10. Hyperlipidemia.  Lopid/Pravachol 11. Atrial fibrillation:             -HR relatively well controlled at present, appropriately elevated with exercise, HR 86 this AM             -eliquis--hon hold, resume after PEG 12.  Diarrhea, stool for C diff is neg, has Crohn's, unable to give pentasa CR due to NGT, likely combination between Crohns and TF +/- antibiotic associated D/C levaquin improving on Questran, didn't respond to lomotil 13. Leukocytosis  Reviewed labs personally, elevated WBC likely residual from HCAP, Crohns may also affect, monitor for fever , afebrile.   - BMET normal except elevated glucose which is TF related  LOS (Days) [place] 02/12/2015, 6:52 AM

## 2015-02-12 NOTE — [facility] Notes (Signed)
Occupational Therapy Session Note  Patient Details  Name: Brian [name] MRN: [id number] Date of Birth: 08-Jan-1929  Today's Date: 02/12/2015 OT Individual Time: 1330-1350 OT Individual Time Calculation (min): 20 min    Short Term Goals: Week 2:  OT Short Term Goal 1 (Week 2): Continue working on supervision level LTGs.  Skilled Therapeutic Interventions/Progress Updates:    Pt lethargic and sleepy laying in the bed.  Vitals checked and listed below.  Attempted to persuade pt to sit up EOB but he continually shook his head "no" when therapist would state.  Pt's wife present as well and reports his was more awake when their granddaughter visited just earlier.  Multiple attempts made to get pt to try to participate, which is usually not an issue with him.  Pt not verbalizing reason for not participating.  Wife present for session discussed recommendations for home equipment needs as well as assistance.  She will check to see if he has a 3:1 at home already.  If not, recommend purchase of one.  Nursing made aware of pt's decline to participate this session.   Therapy Documentation Precautions:  Precautions Precautions: Fall Precaution Comments: NPO; aphasic Restrictions Weight Bearing Restrictions: No General: General OT Amount of Missed Time: 40 Minutes Vital Signs: Therapy Vitals Temp: 97.6 F (36.4 C) Temp Source: Oral Pulse Rate: 89 Resp: 19 BP: (!) 116/53 mmHg Patient Position (if appropriate): Lying Oxygen Therapy SpO2: 100 % O2 Device: Nasal Cannula O2 Flow Rate (L/min): 6 L/min Pulse Oximetry Type: Intermittent Pain: Pain Assessment Pain Assessment: Faces Faces Pain Scale: Hurts little more Pain Type: Surgical pain Pain Location: Abdomen Pain Descriptors / Indicators: Discomfort Pain Intervention(s): RN made aware;Emotional support ADL: See Function Navigator for Current Functional Status.   Therapy/Group: Individual Therapy  Daryan Cagley OTR/L 02/12/2015,  1:55 PM

## 2015-02-12 NOTE — [facility] Notes (Signed)
Social Work Patient ID: Brian [name], male   DOB: [birth year], 81 y.o.   MRN: [id number] Met with pt and wife to discus follow up therapies, if preference for home health agencies. She reports no. Glad the surgery is over and pt is sleeping. Wife wants to learn his peg care and medication management prior to discharge. Will ask RN to go over medications with pt. Follow along and arrange Follow up and equipment needs.

## 2015-02-12 NOTE — [facility] Notes (Signed)
Occupational Therapy Session Note  Patient Details  Name: Brian [name] MRN: [id number] Date of Birth: 02-Mar-1929  Today's Date: 02/12/2015 OT Individual Time: 0732-0827 OT Individual Time Calculation (min): 55 min    Short Term Goals: Week 2:  OT Short Term Goal 1 (Week 2): Continue working on supervision level LTGs.  Skilled Therapeutic Interventions/Progress Updates:    ADL retraining with focus on functional mobility, sequencing with self-care tasks, and pursed lip breathing during functional tasks.  Pt named all clothing items while therapist assisted with picking out clothes.  Ambulated to toilet, completed hygiene seated on toilet post incontinent BM in brief.  Min/steady assist with ambulation and transfers due to tendency to lean backwards.  Pt with improved sequencing of bathing with only initial cue to utilize soap and washcloth for bathing.  Completed dressing from EOB also with improved sequencing, however donned Rt arm through head hole requiring mod cues to correct.  Pt's O2 dropped to 81% on 6L O2 post bathing, requiring 2-3 mins to return to low 90s, pt with difficulty breathing in through nose.  Wife present towards end of session and assisted with providing cues to complete pursed lip breathing.  Pt left seated in recliner with wife present.  Therapy Documentation Precautions:  Precautions Precautions: Fall Precaution Comments: NPO; aphasic Restrictions Weight Bearing Restrictions: No General:   Vital Signs: Therapy Vitals Temp: 97.8 F (36.6 C) Temp Source: Oral Pulse Rate: 86 Resp: 17 BP: 128/75 mmHg Patient Position (if appropriate): Lying Oxygen Therapy SpO2: 96 % O2 Device: Nasal Cannula O2 Flow Rate (L/min): 4 L/min Pain:  Pt with no c/o pain  See Function Navigator for Current Functional Status.   Therapy/Group: Individual Therapy  Simonne Come 02/12/2015, 8:36 AM

## 2015-02-13 ENCOUNTER — Inpatient Hospital Stay (HOSPITAL_COMMUNITY): Payer: Medicare Other

## 2015-02-13 ENCOUNTER — Inpatient Hospital Stay (HOSPITAL_COMMUNITY): Payer: Medicare Other | Admitting: Speech Pathology

## 2015-02-13 ENCOUNTER — Inpatient Hospital Stay (HOSPITAL_COMMUNITY): Payer: Medicare Other | Admitting: Occupational Therapy

## 2015-02-13 MED ORDER — JEVITY 1.2 CAL PO LIQD
1000.0000 mL | ORAL | Status: AC
  Administered 2015-02-13: 1000 mL
  Filled 2015-02-13 (×2): qty 1000

## 2015-02-13 MED ORDER — MESALAMINE 1000 MG RE SUPP
1000.0000 mg | Freq: Two times a day (BID) | RECTAL | Status: DC
  Administered 2015-02-13 – 2015-02-16 (×7): 1000 mg via RECTAL
  Filled 2015-02-13 (×9): qty 1

## 2015-02-13 MED ORDER — GERHARDT'S BUTT CREAM
TOPICAL_CREAM | Freq: Three times a day (TID) | CUTANEOUS | Status: DC
  Administered 2015-02-13 – 2015-02-15 (×6): via TOPICAL
  Administered 2015-02-15: 1 via TOPICAL
  Administered 2015-02-15 – 2015-02-16 (×2): via TOPICAL
  Filled 2015-02-13: qty 1

## 2015-02-13 MED ORDER — FREE WATER
150.0000 mL | Freq: Three times a day (TID) | Status: DC
  Administered 2015-02-13 – 2015-02-16 (×9): 150 mL

## 2015-02-13 MED ORDER — FLUCONAZOLE 100 MG PO TABS
100.0000 mg | ORAL_TABLET | Freq: Every day | ORAL | Status: AC
Start: 2015-02-13 — End: 2015-02-15
  Administered 2015-02-13 – 2015-02-15 (×3): 100 mg
  Filled 2015-02-13 (×4): qty 1

## 2015-02-13 MED ORDER — CHOLESTYRAMINE LIGHT 4 G PO PACK
4.0000 g | PACK | ORAL | Status: DC
  Administered 2015-02-13 – 2015-02-15 (×5): 4 g via ORAL
  Filled 2015-02-13 (×8): qty 1

## 2015-02-13 MED ORDER — JEVITY 1.2 CAL PO LIQD
340.0000 mL | Freq: Every day | ORAL | Status: DC
  Administered 2015-02-14 (×3): 340 mL
  Administered 2015-02-14: 150 mL
  Administered 2015-02-14 – 2015-02-16 (×8): 340 mL
  Filled 2015-02-13 (×19): qty 474

## 2015-02-13 NOTE — Progress Notes (Addendum)
Subjective/Complaints: No abd pain remains aphasic  ROS cannot obtain due to aphasia  Objective: Vital Signs: Blood pressure 121/59, pulse 95, temperature 97.8 F (36.6 C), temperature source Oral, resp. rate 20, height 5\' 11"  (1.803 m), weight 59.2 kg (130 lb 8.2 oz), SpO2 98 %. Ir Gastrostomy Tube Mod Sed  02/12/2015  INDICATION: Recent CVA, now with a aphasia and dysphagia, failed swallow study. Request made for placement of percutaneous gastrostomy tube for enteric nutrition supplementation. EXAM: PULL TROUGH GASTROSTOMY TUBE PLACEMENT COMPARISON:  CT abdomen pelvis - 07/14/2013 MEDICATIONS: Ancef 2 g IV; Antibiotics were administered within 1 hour of the procedure. Glucagon 1 mg IV CONTRAST:  50 mL of Omnipaque 300 administered into the gastric lumen. ANESTHESIA/SEDATION: Versed 1 mg IV; Fentanyl 50 mcg IV Sedation time 7 minutes FLUOROSCOPY TIME:  54 seconds ([id number] mGy) COMPLICATIONS: None immediate PROCEDURE: Informed written consent was obtained from the patient following explanation of the procedure, risks, benefits and alternatives. A time out was performed prior to the initiation of the procedure. Ultrasound scanning was performed to demarcate the edge of the left lobe of the liver. Maximal barrier sterile technique utilized including caps, mask, sterile gowns, sterile gloves, large sterile drape, hand hygiene and Betadine prep. The left upper quadrant was sterilely prepped and draped. An oral gastric catheter was inserted into the stomach under fluoroscopy. The existing nasogastric feeding tube was removed. The left costal margin and air opacified transverse colon were identified and avoided. Air was injected into the stomach for insufflation and visualization under fluoroscopy. Under sterile conditions a 17 gauge trocar needle was utilized to access the stomach percutaneously beneath the left subcostal margin after the overlying soft tissues were anesthetized with 1% Lidocaine with  epinephrine. Needle position was confirmed within the stomach with aspiration of air and injection of small amount of contrast. A single T tack was deployed for gastropexy. Over an Amplatz guide wire, a 9-French sheath was inserted into the stomach. A snare device was utilized to capture the oral gastric catheter. The snare device was pulled retrograde from the stomach up the esophagus and out the oropharynx. The 20-French pull-through gastrostomy was connected to the snare device and pulled antegrade through the oropharynx down the esophagus into the stomach and then through the percutaneous tract external to the patient. The gastrostomy was assembled externally. Contrast injection confirms position in the stomach. Several spot radiographic images were obtained in various obliquities for documentation. The patient tolerated procedure well without immediate post procedural complication. FINDINGS: After successful fluoroscopic guided placement, the gastrostomy tube is appropriately positioned with internal disc against the ventral aspect of the gastric lumen. IMPRESSION: Successful fluoroscopic insertion of a 20-French pull-through gastrostomy tube. The gastrostomy may be used immediately for medication administration and in 24 hrs for the initiation of feeds. Electronically Signed   By: Sandi Mariscal M.D.   On: 02/12/2015 11:01   Results for orders placed or performed during the hospital encounter of 02/02/15 (from the past 72 hour(s))  CBC     Status: Abnormal   Collection Time: 02/12/15  4:30 AM  Result Value Ref Range   WBC 13.6 (H) 4.0 - 10.5 K/uL   RBC 3.94 (L) 4.22 - 5.81 MIL/uL   Hemoglobin 12.5 (L) 13.0 - 17.0 g/dL   HCT 38.3 (L) 39.0 - 52.0 %   MCV 97.2 78.0 - 100.0 fL   MCH 31.7 26.0 - 34.0 pg   MCHC 32.6 30.0 - 36.0 g/dL   RDW 12.8 11.5 - 15.5 %  Platelets 318 150 - 400 K/uL  Protime-INR     Status: Abnormal   Collection Time: 02/12/15  4:30 AM  Result Value Ref Range   Prothrombin Time  17.5 (H) 11.6 - 15.2 seconds   INR 1.43 0.00 - 1.49  APTT     Status: None   Collection Time: 02/12/15  4:30 AM  Result Value Ref Range   aPTT 37 24 - 37 seconds    Comment:        IF BASELINE aPTT IS ELEVATED, SUGGEST PATIENT RISK ASSESSMENT BE USED TO DETERMINE APPROPRIATE ANTICOAGULANT THERAPY.      Gen NAD. Vital signs reviewed HEENT: Normocephalic, atraumatic Cardio: irregular. Normal rate. Resp: CTA B/L and unlabored GI: BS positive and NT, ND. +NGT Skin:   No new skin lesions on visible skin. Neuro: Unable to assess strength and sensation due to aphasia Receptive and exp language deficits without repitition Musc/Skel:  No pain ROM of UE or LE. No edema  Assessment/Plan: 1. Functional deficits secondary to Left MCA which require 3+ hours per day of interdisciplinary therapy in a comprehensive inpatient rehab setting. Physiatrist is providing close team supervision and 24 hour management of active medical problems listed below. Physiatrist and rehab team continue to assess barriers to discharge/monitor patient progress toward functional and medical goals.  Initiate TF today, convert to bolus feeds tomorrow Should be ready for D/C on THurs or Friday  FIM: Function - Bathing Position: Shower Body parts bathed by patient: Right arm, Left arm, Abdomen, Chest, Front perineal area, Right upper leg, Left upper leg, Right lower leg, Left lower leg, Buttocks, Back Body parts bathed by helper: Back, Buttocks Assist Level: Supervision or verbal cues  Function- Upper Body Dressing/Undressing What is the patient wearing?: Pull over shirt/dress Pull over shirt/dress - Perfomed by patient: Thread/unthread right sleeve, Thread/unthread left sleeve, Put head through opening, Pull shirt over trunk Pull over shirt/dress - Perfomed by helper: Put head through opening Assist Level: Supervision or verbal cues, Set up Set up : To obtain clothing/put away Function - Lower Body  Dressing/Undressing What is the patient wearing?: Pants, Underwear, Socks, Shoes Position: Sitting EOB Underwear - Performed by patient: Thread/unthread right underwear leg, Thread/unthread left underwear leg, Pull underwear up/down Pants- Performed by patient: Thread/unthread right pants leg, Thread/unthread left pants leg, Pull pants up/down Pants- Performed by helper: Thread/unthread left pants leg, Thread/unthread right pants leg Socks - Performed by patient: Don/doff right sock, Don/doff left sock Socks - Performed by helper: Don/doff right sock, Don/doff left sock Shoes - Performed by patient: Don/doff right shoe, Don/doff left shoe, Fasten right, Fasten left Assist for footwear: Setup Assist for lower body dressing: Touching or steadying assistance (Pt > 75%)  Function - Toileting Toileting activity did not occur: No continent bowel/bladder event Toileting steps completed by patient: Performs perineal hygiene, Adjust clothing after toileting, Adjust clothing prior to toileting Toileting steps completed by helper: Adjust clothing prior to toileting, Performs perineal hygiene, Adjust clothing after toileting Assist level: Touching or steadying assistance (Pt.75%)  Function - Air cabin crew transfer activity did not occur: N/A Toilet transfer assistive device: Elevated toilet seat/BSC over toilet, Grab bar Assist level to toilet: Touching or steadying assistance (Pt > 75%) Assist level from toilet: Touching or steadying assistance (Pt > 75%) Assist level to bedside commode (at bedside): Supervision or verbal cues Assist level from bedside commode (at bedside): Supervision or verbal cues  Function - Chair/bed transfer Chair/bed transfer method: Ambulatory Chair/bed transfer assist level: Touching  or steadying assistance (Pt > 75%) Chair/bed transfer assistive device: Armrests Chair/bed transfer details: Verbal cues for precautions/safety, Verbal cues for  technique  Function - Locomotion: Wheelchair Will patient use wheelchair at discharge?: No Type: Manual Max wheelchair distance: 150 Assist Level: Supervision or verbal cues Assist Level: Supervision or verbal cues Wheel 150 feet activity did not occur: Safety/medical concerns Assist Level: Supervision or verbal cues Turns around,maneuvers to table,bed, and toilet,negotiates 3% grade,maneuvers on rugs and over doorsills: No Function - Locomotion: Ambulation Assistive device: Hand held assist Max distance: 175 Assist level: Touching or steadying assistance (Pt > 75%) Assist level: Touching or steadying assistance (Pt > 75%) Walk 50 feet with 2 turns activity did not occur: Safety/medical concerns Assist level: Touching or steadying assistance (Pt > 75%) Walk 150 feet activity did not occur: Safety/medical concerns Assist level: Touching or steadying assistance (Pt > 75%) Walk 10 feet on uneven surfaces activity did not occur: Safety/medical concerns Assist level: Maximal assist (Pt 25 - 49%) (LOB backwards)  Function - Comprehension Comprehension: Auditory Comprehension assist level: Understands basic 50 - 74% of the time/ requires cueing 25 - 49% of the time  Function - Expression Expression: Verbal Expression assist level: Expresses basic 25 - 49% of the time/requires cueing 50 - 75% of the time. Uses single words/gestures.  Function - Social Interaction Social Interaction assist level: Interacts appropriately 50 - 74% of the time - May be physically or verbally inappropriate.  Function - Problem Solving Problem solving assist level: Solves basic 50 - 74% of the time/requires cueing 25 - 49% of the time  Function - Memory Memory assist level: Recognizes or recalls 25 - 49% of the time/requires cueing 50 - 75% of the time Patient normally able to recall (first 3 days only): None of the above  Medical Problem List and Plan: 1. 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  DVT Prophylaxis/Anticoagulation: Eliquis. Monitor CBC and BMET 3. Pain Management: Tylenol as needed 4. Mood: BuSpar 15 mg twice a day, Klonopin 0.25 mg 3 times daily as needed             -may need further medication adjustment             -continued egosupport, environmental mod 5. Neuropsych: This patient is not capable of making decisions on his own behalf. 6. Skin/Wound Care: Routine skin checks 7. Fluids/Electrolytes/Nutrition: Routine I&O follow-up chemistries             -adjust h2o flushes as indicated             -replaced potassium  -Diarrhea improved- cont Questran 8. Dysphagia. Nothing by mouth. Nasogastric tube in place. Follow-up speech therapy, patient failed most recent swallow. Continue nothing by mouth              -pt is high aspiration/pneumonia risk given dysphagia  -S/P PEG- start feeds today 9. Healthcare associated pneumonia as well as history of pulmonary fibrosis. completed Levaquin. Chronic oxygen as directed, was on 2L at rest at home and 4L with exercise             -aspiration precautions             -afebrile at present 10. Hyperlipidemia. Lopid/Pravachol 11. Atrial fibrillation:             -HR relatively well controlled at present, appropriately elevated with exercise, HR 95 this AM             -  eliquis--on hold, resume after PEG 12.  Diarrhea, stool for C diff is neg, has Crohn's, unable to give pentasa CR due to NGT, likely combination between Crohns and TF +/- antibiotic associated D/C levaquin improving on Questran, didn't respond to lomotil 13. Leukocytosis  Reviewed labs personally, elevated WBC likely residual from HCAP, Crohns may also affect, monitor for fever , afebrile.   - BMET normal except elevated glucose which is TF related  LOS (Days) 11 A FACE TO FACE EVALUATION WAS PERFORMED  Shermar Friedland E 02/13/2015, 7:20 AM

## 2015-02-13 NOTE — Progress Notes (Signed)
Physical Therapy Session Note  Patient Details  Name: Brian [name] MRN: [id number] Date of Birth: 02/27/1929  Today's Date: 02/13/2015 PT Individual Time: 0905-1005; [id number] PT Individual Time Calculation (min): 60 min , 20 min  Short Term Goals: Week 1:  PT Short Term Goal 1 (Week 1): STGs = LTGs due to ELOS Week 2:   = LTGs due to ELOS  Pt's d/c has been pushed back due to diarrhea due to liquid diet, skin issues and need for further family ed.  Skilled Therapeutic Interventions/Progress Updates:  tx 1:  Pt soiled with watery stool.  No pain voiced or indicated. PT called RN; she and Pam PA evaluated skin; Pam would like pt not to wear diapers diapers, to decrease moisture of skin in peri area. Pt assisted pt don regular briefs with pad inside.  2 sats 97% in sitting on 6L via [place], portable tank.  Bed mobility and donning shoes and pants with supervision.  Gait with RW x 90' ; O2 sats 82% in sitting after gait, on 4L.  O2 increased up to 6L; O2 sats > 90%. Pt returned to room and transferred to recliner to rest.  Wife in room.  tx 2:  Pt stated he is tired. No pain voiced. RN stated pt had had bowel accident again and she placed pull up brief on him, from home.  Gait x 100' including 2 turns, with wife transporting O2 bottle; mod assist stand>sit due to lack of eccentric control, and pt simply collapsing when he got to recliner.  Standing calf raises x 5 with mod/max assist for balance due to frequent LOB backwards with delayed and inadequate balance strategies.  Pt left resting in recliner with back upright, and feet on floor.  PT recommended to pt's wife that he spend some time with feet on floor when in recliner, due to increased sacral pressure when bil LEs are elevated but recliner back is not reclined significantly.    Therapy Documentation Precautions:  Precautions Precautions: Fall Precaution Comments: NPO; aphasic Restrictions Weight Bearing Restrictions: No   Vital  Signs: Sitting Oxygen Therapy SpO2: 99 % O2 Device: Nasal Cannula    See Function Navigator for Current Functional Status.   Therapy/Group: Individual Therapy  Susano Cleckler 02/13/2015, 4:40 PM

## 2015-02-13 NOTE — Progress Notes (Signed)
Occupational Therapy Session Note  Patient Details  Name: Brian [name] MRN: [id number] Date of Birth: 01-29-1929  Today's Date: 02/13/2015 OT Individual Time: 1302-1402 OT Individual Time Calculation (min): 60 min    Short Term Goals: Week 2:  OT Short Term Goal 1 (Week 2): Continue working on supervision level LTGs.  Skilled Therapeutic Interventions/Progress Updates:    Pt worked on bathing and dressing during session.  Mod instructional cueing to sequence bathing tasks and for gathering items for showering.  He needed min steady assist for mobility, sit to stand, and dynamic standing when washing peri area or pulling items up over his hips.  Pt with flat affect this session.  Needed greater amounts of cueing than usual and needed increased rest breaks.  Pt's wife present for session as well.  Left pt in bedside chair with wife present for next session.   Therapy Documentation Precautions:  Precautions Precautions: Fall Precaution Comments: NPO; aphasic Restrictions Weight Bearing Restrictions: No  Pain: Pain Assessment Pain Assessment: Faces Faces Pain Scale: Hurts a little bit Pain Type: Surgical pain Pain Location: Abdomen Pain Descriptors / Indicators: Discomfort Pain Intervention(s): Emotional support;Ambulation/increased activity ADL: See Function Navigator for Current Functional Status.   Therapy/Group: Individual Therapy  Latoya Diskin OTR/L 02/13/2015, 4:10 PM

## 2015-02-13 NOTE — Progress Notes (Signed)
Speech Language Pathology Daily Session Note  Patient Details  Name: Brian [name] MRN: [id number] Date of Birth: 1928-09-26  Today's Date: 02/13/2015 SLP Individual Time: 1030-1130 SLP Individual Time Calculation (min): 60 min  Short Term Goals: Week 1: SLP Short Term Goal 1 (Week 1): Pt will consume therapeutic ice chip trials with minimal overt signs of aspiration across two therapy sessions to determine readiness for repeat MBS SLP Short Term Goal 2 (Week 1): Pt will utilize multimodal communication methods to communicate basic wants/needs 75% of the time with Max cues SLP Short Term Goal 3 (Week 1): Pt will follow one-step commands with 75% accuracy with Max cues within basic, familiar tasks  Skilled Therapeutic Interventions:  Pt was seen for skilled ST targeting goals for dysphagia and communication.  SLP completed skilled observations during PO trials of ice chips to continue working towards diet initiation.  Prior to PO trials, pt was noted with decreased management of secretions in comparison to previous therapy sessions.  Per report, pt had just returned from occupational session and was fatigued which likely contributed to increased cues needed to orally expectorate secretions to correct wet vocal quality.  Pt also noted with decreased toleration of ice chips due to fatigue as evidenced by consistent throat clearing/coughing and wet vocal quality following trials.  Trials limited for pt's safety.  Pt named black and white line drawings of familiar objects for <50% accuracy with max assist multimodal cues to correct perseverative verbal errors.  Pt also benefited from max assist phonemic placement cues to facilitate production of words in pictures.  Pt noted with increased automaticity and spontaneity of verbal expression with pt directed, personally meaningful information.  Pt was left in recliner with quick release belt donned and call bell left within reach.  Continue per current plan of  care.    Function:  Eating Eating   Modified Consistency Diet: No (NPO with PEG ) Eating Assist Level: Supervision or verbal cues (with trials )           Cognition Comprehension Comprehension assist level: Understands basic 50 - 74% of the time/ requires cueing 25 - 49% of the time  Expression   Expression assist level: Expresses basic 25 - 49% of the time/requires cueing 50 - 75% of the time. Uses single words/gestures.  Social Interaction Social Interaction assist level: Interacts appropriately 50 - 74% of the time - May be physically or verbally inappropriate.  Problem Solving Problem solving assist level: Solves basic 50 - 74% of the time/requires cueing 25 - 49% of the time  Memory Memory assist level: Recognizes or recalls 25 - 49% of the time/requires cueing 50 - 75% of the time    Pain Pain Assessment Pain Assessment: No/denies pain  Therapy/Group: Individual Therapy  Purity Irmen, Selinda Orion 02/13/2015, 3:52 PM

## 2015-02-13 NOTE — Progress Notes (Signed)
Nutrition Follow-up  DOCUMENTATION CODES:   Non-severe (moderate) malnutrition in context of chronic illness  INTERVENTION:  Once cleared by IR to resume feedings, initiate Jevity 1.2 via PEG tube at 45 ml/hr and increase by 10 ml every 4 hours to goal rate of 85 ml/hr x 20 hours to provide 2040 kcal (100% of needs), 94 grams of protein, and 1377 ml of free water.   Tomorrow 11/23, Stop continuous tube feeds at 4:00am for transition to bolus tube feeds using Jevity 1.2 via PEG tube. Start at 150 ml and increase by 100 ml every 6 hours to goal of 340 ml 5 times daily to provide 2040 kcal (100% of needs), 94 grams of protein, and 1377 ml of free water.   Provide free water flushes of 150 ml TID per tube.   RD to continue to monitor.   NUTRITION DIAGNOSIS:   Malnutrition related to chronic illness as evidenced by moderate depletions of muscle mass, moderate depletion of body fat; ongoing  GOAL:   Brian [name] will meet greater than or equal to 90% of their needs; progressing  MONITOR:   TF tolerance, Skin, Weight trends, Labs, I & O's  REASON FOR ASSESSMENT:   Consult Enteral/tube feeding initiation and management  ASSESSMENT:   81 y.o. right handed male with history of pulmonary fibrosis with home oxygen 2 L with exertion, atrial fibrillation on chronic Coumadin.  Presented 01/31/2015 with aphasia and right-sided weakness with facial droop. 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  PEG placed 11/21. Plans for initiation of continuous tube feeds today (once PEG tube is stable for use and cleared by IR) then will transition to bolus tube feeds starting tomorrow morning. Wife, Brian [name], and RN made aware of new orders and plan. RD to continue to monitor.    Diet Order:  Diet NPO time specified  Skin:  Wound (see comment) (Stage II pressure ulcer on sacrum)  Last BM:  11/20  Height:   Ht Readings from Last 1 Encounters:  02/10/15 5\' 11"  (1.803 m)     Weight:   Wt Readings from Last 1 Encounters:  02/12/15 130 lb 8.2 oz (59.2 kg)    Ideal Body Weight:  80 kg  BMI:  Body mass index is 18.21 kg/(m^2).  Estimated Nutritional Needs:   Kcal:  1900-2100  Protein:  90-105 grams  Fluid:  1.9 - 2.1 L/day  EDUCATION NEEDS:   No education needs identified at this time  Corrin Parker, MS, RD, LDN Pager # [phone] After hours/ weekend pager # [phone]

## 2015-02-13 NOTE — Plan of Care (Signed)
Problem: RH SKIN INTEGRITY Goal: RH STG SKIN FREE OF INFECTION/BREAKDOWN Pt will remain free of infection and skin breakdown with min assist  Outcome: Not Progressing Stage II Goal: RH STG MAINTAIN SKIN INTEGRITY WITH ASSISTANCE STG Maintain Skin Integrity With [place].  Outcome: Not Progressing Stage II sacrum

## 2015-02-13 NOTE — Progress Notes (Signed)
At 0500 pt voided 200cc amber urine in urinal with assistance. PVR 78cc.

## 2015-02-13 NOTE — Progress Notes (Signed)
Patient has stage II on sacrum.  Had Algis Liming, PA assess.  New orders for medication and air mattress placed.  WOC also consulted for input.  Brita Romp, RN

## 2015-02-13 NOTE — Consult Note (Signed)
WOC wound consult note Reason for Consult: wound input Wound type: MASD (moisture associated skin damage) Measurement: area reddened in gluteal cleft over buttocks Wound bed: denuded skin, blanches  Drainage (amount, consistency, odor) none Periwound: intact  Dressing procedure/placement/frequency: Will add Gerhardt's butt cream as protective barrier cream.  Patient has been using donut to sit on.  I have explained the rationale for not using this any longer.  I have ordered him a chair pressure redistribution pad for use when he is up in the chair.    Discussed POC with patient and bedside nurse.  Re consult if needed, will not follow at this time. Thanks  Kennice Finnie Kellogg, [place] [phone])

## 2015-02-13 NOTE — Progress Notes (Signed)
Social Work Patient ID: Brian [name], male   DOB: 06-21-1928, 81 y.o.   MRN: [id number] Met with pt and wife to discuss equipment and tube feedings needs. Aware referral made to [facility] for follow up-soonest they can see pt is Sat. Wife to get pt a wedge for home for his elevation with PEG tube.  She is aware feedings to begin later this afternoon and try to progress to bolus feeds tomorrow. Wife is willing to learn his care and wants to make sure she is comfortable before going home.

## 2015-02-14 ENCOUNTER — Inpatient Hospital Stay (HOSPITAL_COMMUNITY): Payer: Self-pay | Admitting: Physical Therapy

## 2015-02-14 ENCOUNTER — Inpatient Hospital Stay (HOSPITAL_COMMUNITY): Payer: Medicare Other | Admitting: Occupational Therapy

## 2015-02-14 ENCOUNTER — Inpatient Hospital Stay (HOSPITAL_COMMUNITY): Payer: Medicare Other | Admitting: Speech Pathology

## 2015-02-14 NOTE — Progress Notes (Signed)
Subjective/Complaints: Tolerating TF at 46ml/hr.   TO start TF boluses at 145ml today with goal of 377ml 5x per day  ROS cannot obtain due to aphasia  Objective: Vital Signs: Blood pressure 118/66, pulse 90, temperature 98.1 F (36.7 C), temperature source Oral, resp. rate 18, height 5\' 11"  (1.803 m), weight 61.054 kg (134 lb 9.6 oz), SpO2 98 %. Ir Gastrostomy Tube Mod Sed  02/12/2015  INDICATION: Recent CVA, now with a aphasia and dysphagia, failed swallow study. Request made for placement of percutaneous gastrostomy tube for enteric nutrition supplementation. EXAM: PULL TROUGH GASTROSTOMY TUBE PLACEMENT COMPARISON:  CT abdomen pelvis - 07/14/2013 MEDICATIONS: Ancef 2 g IV; Antibiotics were administered within 1 hour of the procedure. Glucagon 1 mg IV CONTRAST:  50 mL of Omnipaque 300 administered into the gastric lumen. ANESTHESIA/SEDATION: Versed 1 mg IV; Fentanyl 50 mcg IV Sedation time 7 minutes FLUOROSCOPY TIME:  54 seconds ([id number] mGy) COMPLICATIONS: None immediate PROCEDURE: Informed written consent was obtained from the patient following explanation of the procedure, risks, benefits and alternatives. A time out was performed prior to the initiation of the procedure. Ultrasound scanning was performed to demarcate the edge of the left lobe of the liver. Maximal barrier sterile technique utilized including caps, mask, sterile gowns, sterile gloves, large sterile drape, hand hygiene and Betadine prep. The left upper quadrant was sterilely prepped and draped. An oral gastric catheter was inserted into the stomach under fluoroscopy. The existing nasogastric feeding tube was removed. The left costal margin and air opacified transverse colon were identified and avoided. Air was injected into the stomach for insufflation and visualization under fluoroscopy. Under sterile conditions a 17 gauge trocar needle was utilized to access the stomach percutaneously beneath the left subcostal margin after the  overlying soft tissues were anesthetized with 1% Lidocaine with epinephrine. Needle position was confirmed within the stomach with aspiration of air and injection of small amount of contrast. A single T tack was deployed for gastropexy. Over an Amplatz guide wire, a 9-French sheath was inserted into the stomach. A snare device was utilized to capture the oral gastric catheter. The snare device was pulled retrograde from the stomach up the esophagus and out the oropharynx. The 20-French pull-through gastrostomy was connected to the snare device and pulled antegrade through the oropharynx down the esophagus into the stomach and then through the percutaneous tract external to the patient. The gastrostomy was assembled externally. Contrast injection confirms position in the stomach. Several spot radiographic images were obtained in various obliquities for documentation. The patient tolerated procedure well without immediate post procedural complication. FINDINGS: After successful fluoroscopic guided placement, the gastrostomy tube is appropriately positioned with internal disc against the ventral aspect of the gastric lumen. IMPRESSION: Successful fluoroscopic insertion of a 20-French pull-through gastrostomy tube. The gastrostomy may be used immediately for medication administration and in 24 hrs for the initiation of feeds. Electronically Signed   By: Sandi Mariscal M.D.   On: 02/12/2015 11:01   Results for orders placed or performed during the hospital encounter of 02/02/15 (from the past 72 hour(s))  CBC     Status: Abnormal   Collection Time: 02/12/15  4:30 AM  Result Value Ref Range   WBC 13.6 (H) 4.0 - 10.5 K/uL   RBC 3.94 (L) 4.22 - 5.81 MIL/uL   Hemoglobin 12.5 (L) 13.0 - 17.0 g/dL   HCT 38.3 (L) 39.0 - 52.0 %   MCV 97.2 78.0 - 100.0 fL   MCH 31.7 26.0 - 34.0 pg  MCHC 32.6 30.0 - 36.0 g/dL   RDW 12.8 11.5 - 15.5 %   Platelets 318 150 - 400 K/uL  Protime-INR     Status: Abnormal   Collection Time:  02/12/15  4:30 AM  Result Value Ref Range   Prothrombin Time 17.5 (H) 11.6 - 15.2 seconds   INR 1.43 0.00 - 1.49  APTT     Status: None   Collection Time: 02/12/15  4:30 AM  Result Value Ref Range   aPTT 37 24 - 37 seconds    Comment:        IF BASELINE aPTT IS ELEVATED, SUGGEST PATIENT RISK ASSESSMENT BE USED TO DETERMINE APPROPRIATE ANTICOAGULANT THERAPY.      Gen NAD. Vital signs reviewed HEENT: Normocephalic, atraumatic Cardio: irregular. Normal rate. Resp: CTA B/L and unlabored GI: BS positive and NT, ND. +NGT Skin:   No new skin lesions on visible skin. Neuro: Unable to assess strength and sensation due to aphasia Receptive and exp language deficits without repitition Musc/Skel:  No pain ROM of UE or LE. No edema  Assessment/Plan: 1. Functional deficits secondary to Left MCA which require 3+ hours per day of interdisciplinary therapy in a comprehensive inpatient rehab setting. Physiatrist is providing close team supervision and 24 hour management of active medical problems listed below. Physiatrist and rehab team continue to assess barriers to discharge/monitor patient progress toward functional and medical goals.  Initiate TF today, convert to bolus feeds today Should be ready for D/C on THurs or Friday  FIM: Function - Bathing Position: Shower Body parts bathed by patient: Right arm, Left arm, Abdomen, Chest, Front perineal area, Right upper leg, Left upper leg, Right lower leg, Left lower leg, Buttocks, Back Body parts bathed by helper: Back, Buttocks Assist Level: Touching or steadying assistance(Pt > 75%)  Function- Upper Body Dressing/Undressing What is the patient wearing?: Pull over shirt/dress Pull over shirt/dress - Perfomed by patient: Thread/unthread right sleeve, Thread/unthread left sleeve, Put head through opening, Pull shirt over trunk Pull over shirt/dress - Perfomed by helper: Put head through opening Assist Level: Supervision or verbal cues, Set  up Set up : To obtain clothing/put away Function - Lower Body Dressing/Undressing What is the patient wearing?: Pants, Underwear, Socks, Shoes Position: Sitting EOB Underwear - Performed by patient: Thread/unthread left underwear leg, Pull underwear up/down, Thread/unthread right underwear leg Pants- Performed by patient: Thread/unthread right pants leg, Thread/unthread left pants leg, Pull pants up/down Pants- Performed by helper: Thread/unthread left pants leg, Thread/unthread right pants leg Socks - Performed by patient: Don/doff right sock, Don/doff left sock Socks - Performed by helper: Don/doff right sock, Don/doff left sock Shoes - Performed by patient: Don/doff right shoe, Don/doff left shoe, Fasten right, Fasten left Assist for footwear: Setup Assist for lower body dressing: Touching or steadying assistance (Pt > 75%)  Function - Toileting Toileting activity did not occur: No continent bowel/bladder event Toileting steps completed by patient: Performs perineal hygiene, Adjust clothing after toileting, Adjust clothing prior to toileting Toileting steps completed by helper: Adjust clothing prior to toileting, Performs perineal hygiene, Adjust clothing after toileting Assist level: Touching or steadying assistance (Pt.75%)  Function - Air cabin crew transfer activity did not occur: N/A Toilet transfer assistive device: Elevated toilet seat/BSC over toilet, Grab bar Assist level to toilet: Touching or steadying assistance (Pt > 75%) Assist level from toilet: Touching or steadying assistance (Pt > 75%) Assist level to bedside commode (at bedside): Supervision or verbal cues Assist level from bedside commode (at bedside):  Supervision or verbal cues  Function - Chair/bed transfer Chair/bed transfer method: Ambulatory Chair/bed transfer assist level: Touching or steadying assistance (Pt > 75%) Chair/bed transfer assistive device: Armrests Chair/bed transfer details: Verbal  cues for precautions/safety, Verbal cues for technique  Function - Locomotion: Wheelchair Will patient use wheelchair at discharge?: No Type: Manual Max wheelchair distance: 150 Assist Level: Supervision or verbal cues Assist Level: Supervision or verbal cues Wheel 150 feet activity did not occur: Safety/medical concerns Assist Level: Supervision or verbal cues Turns around,maneuvers to table,bed, and toilet,negotiates 3% grade,maneuvers on rugs and over doorsills: No Function - Locomotion: Ambulation Assistive device: Walker-rolling Max distance: 100 Assist level: Touching or steadying assistance (Pt > 75%) Assist level: Touching or steadying assistance (Pt > 75%) Walk 50 feet with 2 turns activity did not occur: Safety/medical concerns Assist level: Touching or steadying assistance (Pt > 75%) Walk 150 feet activity did not occur: Safety/medical concerns Assist level: Touching or steadying assistance (Pt > 75%) Walk 10 feet on uneven surfaces activity did not occur: Safety/medical concerns Assist level: Maximal assist (Pt 25 - 49%) (LOB backwards)  Function - Comprehension Comprehension: Auditory Comprehension assist level: Understands basic 90% of the time/cues < 10% of the time  Function - Expression Expression: Verbal Expression assist level: Expresses basic 50 - 74% of the time/requires cueing 25 - 49% of the time. Needs to repeat parts of sentences.  Function - Social Interaction Social Interaction assist level: Interacts appropriately 90% of the time - Needs monitoring or encouragement for participation or interaction.  Function - Problem Solving Problem solving assist level: Solves basic 50 - 74% of the time/requires cueing 25 - 49% of the time  Function - Memory Memory assist level: Recognizes or recalls 50 - 74% of the time/requires cueing 25 - 49% of the time Patient normally able to recall (first 3 days only): None of the above  Medical Problem List and Plan: 1.  Functional deficits secondary to left MCA embolic infarct  - Continue CIR, amb min G assist with PT 2.  DVT Prophylaxis/Anticoagulation: Eliquis. Monitor CBC and BMET, INR is mildly elevated without warfarin, ? Etiology, no evidence of hemorrhage  3. Pain Management: Tylenol as needed 4. Mood: BuSpar 15 mg twice a day, Klonopin 0.25 mg 3 times daily as needed             -may need further medication adjustment             -continued egosupport, environmental mod 5. Neuropsych: This patient is not capable of making decisions on his own behalf. 6. Skin/Wound Care: Routine skin checks 7. Fluids/Electrolytes/Nutrition: Routine I&O follow-up chemistries             -adjust h2o flushes as indicated             -replaced potassium  -Diarrhea improved- cont Questran 8. Dysphagia. Nothing by mouth. Nasogastric tube in place. Follow-up speech therapy, patient failed most recent swallow. Continue nothing by mouth              -pt is high aspiration/pneumonia risk given dysphagia  -S/P PEG- start bolus feeds today 9. Healthcare associated pneumonia as well as history of pulmonary fibrosis. completed Levaquin. Chronic oxygen as directed, was on 2L at rest at home and 4L with exercise             -aspiration precautions             -afebrile at present 10. Hyperlipidemia. Lopid/Pravachol 11. Atrial fibrillation:             -HR relatively well controlled at present, appropriately elevated with exercise, HR 90 this AM             -eliquis--on hold, resume after PEG 12.  Diarrhea, stool for C diff is neg, has Crohn's, unable to give pentasa CR due to NGT, likely combination between Crohns and TF +/- antibiotic associated D/C levaquin improving on Questran, didn't respond to lomotil,  has about 2 loose stools per morning.may be able to give pentasa CR supp 13. Leukocytosis Improving  Reviewed labs personally, elevated WBC likely residual from HCAP, Crohns may also affect, monitor for fever , afebrile.    14.  Cont of bladder with low residual last reading LOS (Days) 12 A FACE TO FACE EVALUATION WAS PERFORMED  KIRSTEINS,ANDREW E 02/14/2015, 7:43 AM

## 2015-02-14 NOTE — Progress Notes (Signed)
Speech Language Pathology Daily Session Note  Patient Details  Name: Brian [name] MRN: [id number] Date of Birth: 04/02/1928  Today's Date: 02/14/2015 SLP Individual Time: [id number] SLP Individual Time Calculation (min): 29 min  Short Term Goals: Week 1: SLP Short Term Goal 1 (Week 1): Pt will consume therapeutic ice chip trials with minimal overt signs of aspiration across two therapy sessions to determine readiness for repeat MBS SLP Short Term Goal 2 (Week 1): Pt will utilize multimodal communication methods to communicate basic wants/needs 75% of the time with Max cues SLP Short Term Goal 3 (Week 1): Pt will follow one-step commands with 75% accuracy with Max cues within basic, familiar tasks  Skilled Therapeutic Interventions:  Pt was seen for skilled ST targeting goals for dysphagia.  Pt much more bright and alert this session in comparison to the last 2 days.  SLP facilitated the session with trials of ice chips and thin liquids following oral care to continue working towards liquids advancement.  Pt consumed trials of ice chips and thin liquids via teaspoon without overt s/s of aspiration; however, pt demonstrated immediate cough in 3 out of 5 trials of thin liquids via cup sips.  Multiple swallows observed in 100% of trials of thin liquids via cup sips.   Pt returned demonstration of pharyngeal strengthening exercises with min assist verbal and demonstration cues.  Pt was noted with improved automaticity of verbal expression today which SLP suspects to be a function of overall improved mentation and affect.  Pt was able to communicate in short, automatic phrases but continued to require max to total assist for naming of the days of the week with the use of a calendar aid.  Pt was left in recliner with call bell left within reach.  Continue per current plan of care.      Function:  Eating Eating   Modified Consistency Diet: No (NPO with PEG ) Eating Assist Level: Supervision or verbal  cues (with trials)           Cognition Comprehension Comprehension assist level: Understands basic 75 - 89% of the time/ requires cueing 10 - 24% of the time  Expression   Expression assist level: Expresses basic 50 - 74% of the time/requires cueing 25 - 49% of the time. Needs to repeat parts of sentences.  Social Interaction Social Interaction assist level: Interacts appropriately 75 - 89% of the time - Needs redirection for appropriate language or to initiate interaction.  Problem Solving Problem solving assist level: Solves basic 50 - 74% of the time/requires cueing 25 - 49% of the time  Memory Memory assist level: Recognizes or recalls 50 - 74% of the time/requires cueing 25 - 49% of the time    Pain Pain Assessment Pain Assessment: No/denies pain  Therapy/Group: Individual Therapy  Brian [name], Brian [name] 02/14/2015, 3:57 PM

## 2015-02-14 NOTE — Progress Notes (Signed)
Social Work Patient ID: Brian [name], male   DOB: 05/17/1928, 81 y.o.   MRN: [id number] Met with pt and wife to disucss team conferecne progression toward his goals and to begin bolus feeds today and hopefully be ready for discharge 11/25.  Wife anxious to learn the feeds so she gets it right and feels comfortable. [facility] to deliver tube feeding today to home at 2:00pm. She is aware she needs to be here Friday am to go through PT and OT therapies prior to discharge. Will work toward discharge Friday.

## 2015-02-14 NOTE — Progress Notes (Signed)
Physical Therapy Session Note  Patient Details  Name: Brian [name] MRN: [id number] Date of Birth: 06/05/1928  Today's Date: 02/14/2015 PT Individual Time: 0730-0840 PT Individual Time Calculation (min): 70 min   Short Term Goals: Week 1:  PT Short Term Goal 1 (Week 1): STGs = LTGs due to ELOS  Skilled Therapeutic Interventions/Progress Updates:    Pt rec'd in bed, no c/o pain, able to verbalize need to use restroom. Pt performed bed mobility with supervision. Min A gait to bathroom without AD. Pt able to perform clothing negotiation and hygiene for toileting without assistance.  Pt min A for gait around room for gathering clothes to change and for hand washing. Pt then requires prolonged rest break due to fatigue. spO2 92% on 4LO2 after activity.  Gait 150' without AD with min A.  Berg balance test redone. Pt score decreased possibly due to more fatigue today, pt more dependent on PT for steadying assist for most tasks.  See below for details. Pt performed seated therex with B LE for strengthening.  W/c mobility back to room due to fatigue. Pt able to propel with cues only for initiation. Pt requires multiple prolonged rest breaks throughout session due to fatigue and SOB, spO2 > 90% throughout.   Therapy Documentation Precautions:  Precautions Precautions: Fall Precaution Comments: NPO; aphasic Restrictions Weight Bearing Restrictions: No General: PT Amount of Missed Time (min): 20 Minutes PT Missed Treatment Reason: Patient fatigue  Balance: Berg Balance Test Sit to Stand: Able to stand  independently using hands Standing Unsupported: Able to stand 30 seconds unsupported Sitting with Back Unsupported but Feet Supported on Floor or Stool: Able to sit safely and securely 2 minutes Stand to Sit: Controls descent by using hands Transfers: Needs one person to assist Standing Unsupported with Eyes Closed: Able to stand 10 seconds with supervision Standing Ubsupported with Feet  Together: Needs help to attain position but able to stand for 30 seconds with feet together From Standing, Reach Forward with Outstretched Arm: Reaches forward but needs supervision From Standing Position, Pick up Object from Floor: Unable to try/needs assist to keep balance From Standing Position, Turn to Look Behind Over each Shoulder: Turn sideways only but maintains balance Turn 360 Degrees: Needs assistance while turning Standing Unsupported, Alternately Place Feet on Step/Stool: Able to complete >2 steps/needs minimal assist Standing Unsupported, One Foot in Front: Needs help to step but can hold 15 seconds Standing on One Leg: Unable to try or needs assist to prevent fall Total Score: 22   See Function Navigator for Current Functional Status.   Therapy/Group: Individual Therapy  [name] 02/14/2015, 8:42 AM

## 2015-02-14 NOTE — Patient Care Conference (Signed)
Inpatient RehabilitationTeam Conference and Plan of Care Update Date: 02/14/2015   Time: 10:30 AM    Patient Name: Brian [name]      Medical Record Number: [id number]  Date of Birth: [birth year] Sex: Male         Room/Bed: 4M07C/4M07C-01 Payor Info: Payor: MEDICARE / Plan: MEDICARE PART A AND B / Product Type: *No Product type* /    Admitting Diagnosis: L CVA  Admit Date/Time:  02/02/2015  6:13 PM Admission Comments: No comment available   Primary Diagnosis:  Left middle cerebral artery stroke [facility]) Principal Problem: Left middle cerebral artery stroke [facility])  Patient Active Problem List   Diagnosis Date Noted  . Dysphagia   . Left middle cerebral artery stroke ([place]) 02/02/2015  . Dysphagia, pharyngoesophageal phase 02/02/2015  . Aphasia due to recent cerebrovascular accident 02/02/2015  . Bacterial pneumonia   . Stroke with cerebral ischemia ([place])   . Stroke ([place]) 01/31/2015  . CVA (cerebral infarction) 01/31/2015  . Aphasia   . Hypokalemia   . CAP (community acquired pneumonia) 01/26/2015  . Pulmonary fibrosis ([place]) 01/26/2015  . PCP NOTES >>>>>>>>>>>> 12/14/2014  . Vitamin D deficiency 11/04/2013  . Vasomotor rhinitis 09/22/2013  . Diarrhea 07/08/2013  . Crohn's disease ([place]) 07/08/2013  . Postinflammatory pulmonary fibrosis ([place]) 05/26/2013  . Anxiety state 05/20/2013  . Sprain of shoulder, left 06/01/2012  . Personal history of colonic polyps 04/12/2012  . Pulmonary nodules 03/02/2011  . Cough 03/02/2011  . Long term (current) use of anticoagulants 02/21/2011  . Atrial fibrillation with controlled ventricular response ([place]) 02/17/2011  . Annual physical exam 12/23/2010  . Dyspnea 09/03/2010  . HEMORRHOIDS-INTERNAL 03/08/2010  . [place] INTESTINE 03/08/2010  . RECTAL BLEEDING 03/08/2010  . HEMATOCHEZIA 03/06/2010  . Essential hypertension 02/04/2010  . HYPERTROPHY PROSTATE W/O UR OBST & OTH LUTS 12/10/2009  . Osteopenia 12/07/2009  .  PERIPHERAL NEUROPATHY 08/29/2009  . FECAL INCONTINENCE 01/29/2009  . OSTEOARTHRITIS, ANKLE, RIGHT 11/01/2008  . ERECTILE DYSFUNCTION 12/02/2007  . TREMOR 12/02/2007  . CARCINOMA, SKIN, SQUAMOUS CELL 09/16/2006  . B12 DEFICIENCY 08/27/2006  . HYPERTRIGLYCERIDEMIA 08/27/2006    Expected Discharge Date: Expected Discharge Date: 02/16/15  Team Members Present: Physician leading conference: Dr. Alysia Penna Social Worker Present: Ovidio Kin, LCSW Nurse Present: Heather Roberts, RN PT Present: Georjean Mode, PT OT Present: Clyda Greener, OT SLP Present: Windell Moulding, SLP PPS Coordinator present : Daiva Nakayama, RN, CRRN     Current Status/Progress Goal Weekly Team Focus  Medical   diarrhea improving, now with PEG  home with family, PEG feeds  D/C teaching   Bowel/Bladder   incontinent of bowel. LBM 11-23 loose. Continent of bladder with use of urinal  manage bowel and bladder mod assist  timed toileting regimen for bowels.    Swallow/Nutrition/ Hydration   NPO with PEG  min assist during trials   continue trials of ice chips and pharyngeal strengthening exercises   ADL's   min assist level for transfers and LB selfcare.  Needs mod instructional cueing for sequencing through selfcare tasks.  Weaker with flatter affect since PEG placement.  Needs 6Ls O2 with activity to keep sats in high 80s to mid 90s.    supervision overall  selfcare re-training, balance re-training, DME education, cognitive retraining, pt/family education   Mobility   91% O2 sats on 4L O2 with activity; Berg declined  to 22/56 from 28/56;  min assist transfers and gait x 150', generally weaker and less interactive  Supervision  overall   dynamic standing balance, gait training, endurance   Communication   mod-max assist for verbal expression  mod assist   automatic sequences, awareness of verbal errors/perseveration, naming basic, familiar objects   Safety/Cognition/ Behavioral Observations  slightly improved  awareness of verbal errors but continues to need max to total cues to correct them  mod assist   use of call bell, awareness of deficits   Pain   N/A         Skin   MASD, yeast to peri area. Reddened blanchable sacrum garhardts butt cream ordered. Lotrimin applied to groin as ordered. PEG tube inplace. split guaze inplace  free of further skin breakdown mod assist while on rehab unit  assess skin qshift, continue use of cream to sacrum and peri area      *See Care Plan and progress notes for long and short-term goals.  Barriers to Discharge: see above    Possible Resolutions to Barriers:  RN to provide family     Discharge Planning/Teaching Needs:  Home with wife who is here daily and attending therapies with pt. Learning his PEG feedings today.  PEG tube feedings and PEG care   Team Discussion:  Pt declined somewhat in PT & OT-but doing better today in therapies. PEG placed- bolus feeds to start today and hopefully will be ready for discharge Friday. Family education on-going with wife. Stage 2 on sacrum-WOC seeing.   Revisions to Treatment Plan:  DC Friday   Continued Need for Acute Rehabilitation Level of Care: The patient requires daily medical management by a physician with specialized training in physical medicine and rehabilitation for the following conditions: Daily direction of a multidisciplinary physical rehabilitation program to ensure safe treatment while eliciting the highest outcome that is of practical value to the patient.: Yes Daily medical management of patient stability for increased activity during participation in an intensive rehabilitation regime.: Yes Daily analysis of laboratory values and/or radiology reports with any subsequent need for medication adjustment of medical intervention for : Neurological problems;Other  Elease Hashimoto 02/14/2015, 12:56 PM

## 2015-02-14 NOTE — Progress Notes (Signed)
Occupational Therapy Session Note  Patient Details  Name: Brian [name] MRN: [id number] Date of Birth: 01-21-1929  Today's Date: 02/14/2015 OT Individual Time: 1300-1359 OT Individual Time Calculation (min): 59 min    Short Term Goals: Week 2:  OT Short Term Goal 1 (Week 2): Continue working on supervision level LTGs.  Skilled Therapeutic Interventions/Progress Updates:    Pt completed bathing and dressing during session.  Pt more verbal and bright this session compared to yesterday.  Mod instructional cueing for gathering clothing prior to transfer to the bathroom toilet.  Min assist for balance during mobility in the room without assistive device.  Pt completed toileting tasks with min guard assist before transferring to the shower bench.  Min instructional cueing for sequencing shower.  Ambulated out to the bedside chair for dressing.  Pt needed min questioning cueing to sequence dressing.  Mod instructional cueing to scan right of midline for locating his shoes as well as socks secondary to right visual field cut.  Pt left in bedside chair with safety belt in place.    Therapy Documentation Precautions:  Precautions Precautions: Fall Precaution Comments: NPO; aphasic Restrictions Weight Bearing Restrictions: No  Pain: Pain Assessment Pain Assessment: No/denies pain ADL: See Function Navigator for Current Functional Status.   Therapy/Group: Individual Therapy  Danicia Terhaar OTR/L 02/14/2015, 2:27 PM

## 2015-02-14 NOTE — Progress Notes (Signed)
Occupational Therapy Session Note  Patient Details  Name: Brian [name] MRN: [id number] Date of Birth: 1929-03-11  Today's Date: 02/14/2015 OT Individual Time: 1003-1030 OT Individual Time Calculation (min): 27 min    Short Term Goals: Week 2:  OT Short Term Goal 1 (Week 2): Continue working on supervision level LTGs.  Skilled Therapeutic Interventions/Progress Updates:    Pt worked on standing balance for shaving at the sink.  Min assist needed for balance while ambulating to the sink and for standing while shaving.  Pt stands in posterior pelvic tilt with decreased active flexion in the abdomnals.  Pt able to stand for 5 mins with oxygen sats decreasing to 84% on 4Ls when sitting to rest.  Increased oxygen to 6Ls to increase back to 92% after 1 minute of purse lip breathing.  Pt able to work on short distance mobility in the hall with min assist hand held to conclude session.  Pt left in the bedside chair with wife present to conclude session.    Therapy Documentation Precautions:  Precautions Precautions: Fall Precaution Comments: NPO; aphasic Restrictions Weight Bearing Restrictions: No General: General PT Missed Treatment Reason: Patient fatigue  Pain: Pain Assessment Pain Assessment: No/denies pain ADL: ADL ADL Comments: Refer to functional navigator  See Function Navigator for Current Functional Status.   Therapy/Group: Individual Therapy  Seona Clemenson OTR/L 02/14/2015, 10:49 AM

## 2015-02-14 NOTE — Progress Notes (Signed)
Social Work Elease Hashimoto, LCSW Social Worker Signed Physical Medicine and Rehabilitation Patient Care Conference 02/14/2015 12:56 PM    Expand All Collapse All   Inpatient RehabilitationTeam Conference and Plan of Care Update Date: 02/14/2015   Time: 10:30 AM     Patient Name: Brian [name]       Medical Record Number: [id number]  Date of Birth: 07-13-1928 Sex: Male         Room/Bed: 4M07C/4M07C-01 Payor Info: Payor: MEDICARE / Plan: MEDICARE PART A AND B / Product Type: *No Product type* /    Admitting Diagnosis: L CVA   Admit Date/Time:  02/02/2015  6:13 PM Admission Comments: No comment available   Primary Diagnosis:  Left middle cerebral artery stroke [facility]) Principal Problem: Left middle cerebral artery stroke [facility])    Patient Active Problem List     Diagnosis  Date Noted   .  Dysphagia     .  Left middle cerebral artery stroke ([place])  02/02/2015   .  Dysphagia, pharyngoesophageal phase  02/02/2015   .  Aphasia due to recent cerebrovascular accident  02/02/2015   .  Bacterial pneumonia     .  Stroke with cerebral ischemia ([place])     .  Stroke ([place])  01/31/2015   .  CVA (cerebral infarction)  01/31/2015   .  Aphasia     .  Hypokalemia     .  CAP (community acquired pneumonia)  01/26/2015   .  Pulmonary fibrosis ([place])  01/26/2015   .  PCP NOTES >>>>>>>>>>>>  12/14/2014   .  Vitamin D deficiency  11/04/2013   .  Vasomotor rhinitis  09/22/2013   .  Diarrhea  07/08/2013   .  Crohn's disease ([place])  07/08/2013   .  Postinflammatory pulmonary fibrosis ([place])  05/26/2013   .  Anxiety state  05/20/2013   .  Sprain of shoulder, left  06/01/2012   .  Personal history of colonic polyps  04/12/2012   .  Pulmonary nodules  03/02/2011   .  Cough  03/02/2011   .  Long term (current) use of anticoagulants  02/21/2011   .  Atrial fibrillation with controlled ventricular response ([place])  02/17/2011   .  Annual physical exam  12/23/2010   .  Dyspnea  09/03/2010   .   HEMORRHOIDS-INTERNAL  03/08/2010   .  Peculiar INTESTINE  03/08/2010   .  RECTAL BLEEDING  03/08/2010   .  HEMATOCHEZIA  03/06/2010   .  Essential hypertension  02/04/2010   .  HYPERTROPHY PROSTATE W/O UR OBST & OTH LUTS  12/10/2009   .  Osteopenia  12/07/2009   .  PERIPHERAL NEUROPATHY  08/29/2009   .  FECAL INCONTINENCE  01/29/2009   .  OSTEOARTHRITIS, ANKLE, RIGHT  11/01/2008   .  ERECTILE DYSFUNCTION  12/02/2007   .  TREMOR  12/02/2007   .  CARCINOMA, SKIN, SQUAMOUS CELL  09/16/2006   .  B12 DEFICIENCY  08/27/2006   .  HYPERTRIGLYCERIDEMIA  08/27/2006     Expected Discharge Date: Expected Discharge Date: 02/16/15  Team Members Present: Physician leading conference: Dr. Alysia Penna Social Worker Present: Ovidio Kin, LCSW Nurse Present: Heather Roberts, RN PT Present: Georjean Mode, PT OT Present: Clyda Greener, OT SLP Present: Windell Moulding, SLP PPS Coordinator present : Daiva Nakayama, RN, CRRN        Current Status/Progress  Goal  Weekly Team Focus   Medical  diarrhea improving, now with PEG  home with family, PEG feeds  D/C teaching   Bowel/Bladder     incontinent of bowel. LBM 11-23 loose. Continent of bladder with use of urinal  manage bowel and bladder mod assist   timed toileting regimen for bowels.     Swallow/Nutrition/ Hydration     NPO with PEG  min assist during trials   continue trials of ice chips and pharyngeal strengthening exercises   ADL's     min assist level for transfers and LB selfcare.  Needs mod instructional cueing for sequencing through selfcare tasks.  Weaker with flatter affect since PEG placement. Needs 6Ls O2 with activity to keep sats in high 80s to mid 90s.    supervision overall  selfcare re-training, balance re-training, DME education, cognitive retraining, pt/family education   Mobility     91% O2 sats on 4L O2 with activity; Berg declined  to 22/56 from 28/56;  min assist transfers and gait x 150', generally weaker  and less interactive  Supervision overall   dynamic standing balance, gait training, endurance    Communication     mod-max assist for verbal expression   mod assist   automatic sequences, awareness of verbal errors/perseveration, naming basic, familiar objects   Safety/Cognition/ Behavioral Observations    slightly improved awareness of verbal errors but continues to need max to total cues to correct them  mod assist   use of call bell, awareness of deficits    Pain     N/A         Skin     MASD, yeast to peri area. Reddened blanchable sacrum garhardts butt cream ordered. Lotrimin applied to groin as ordered. PEG tube inplace. split guaze inplace  free of further skin breakdown mod assist while on rehab unit   assess skin qshift, continue use of cream to sacrum and peri area       *See Care Plan and progress notes for long and short-term goals.    Barriers to Discharge:  see above     Possible Resolutions to Barriers:   RN to provide family      Discharge Planning/Teaching Needs:   Home with wife who is here daily and attending therapies with pt. Learning his PEG feedings today.  PEG tube feedings and PEG care    Team Discussion:    Pt declined somewhat in PT & OT-but doing better today in therapies. PEG placed- bolus feeds to start today and hopefully will be ready for discharge Friday. Family education on-going with wife. Stage 2 on sacrum-WOC seeing.    Revisions to Treatment Plan:    DC Friday    Continued Need for Acute Rehabilitation Level of Care: The patient requires daily medical management by a physician with specialized training in physical medicine and rehabilitation for the following conditions: Daily direction of a multidisciplinary physical rehabilitation program to ensure safe treatment while eliciting the highest outcome that is of practical value to the patient.: Yes Daily medical management of patient stability for increased activity during participation in an  intensive rehabilitation regime.: Yes Daily analysis of laboratory values and/or radiology reports with any subsequent need for medication adjustment of medical intervention for : Neurological problems;Other  Elease Hashimoto 02/14/2015, 12:56 PM                  Patient ID: Brian [name], male   DOB: Mar 21, 1929, 81 y.o.   MRN: [id number]

## 2015-02-15 MED ORDER — APIXABAN 5 MG PO TABS
5.0000 mg | ORAL_TABLET | Freq: Two times a day (BID) | ORAL | Status: DC
  Administered 2015-02-15 – 2015-02-16 (×2): 5 mg via ORAL
  Filled 2015-02-15 (×2): qty 1

## 2015-02-15 NOTE — Progress Notes (Signed)
Subjective/Complaints: Sitting in bed. No complaints. Appears comfortable.   ROS cannot obtain due to aphasia  Objective: Vital Signs: Blood pressure 119/80, pulse 94, temperature 98.5 F (36.9 C), temperature source Oral, resp. rate 18, height 5\' 11"  (1.803 m), weight 59.4 kg (130 lb 15.3 oz), SpO2 100 %. No results found. No results found for this or any previous visit (from the past 72 hour(s)).   Gen NAD. Vital signs reviewed HEENT: Normocephalic, atraumatic Cardio: irregular. Normal rate. Resp: CTA B/L and unlabored GI: BS positive and NT, ND. +NGT Skin:   No new skin lesions on visible skin. Neuro: Unable to assess strength and sensation due to aphasia Receptive and exp language deficits without repitition Musc/Skel:  No pain ROM of UE or LE. No edema  Assessment/Plan: 1. Functional deficits secondary to Left MCA which require 3+ hours per day of interdisciplinary therapy in a comprehensive inpatient rehab setting. Physiatrist is providing close team supervision and 24 hour management of active medical problems listed below. Physiatrist and rehab team continue to assess barriers to discharge/monitor patient progress toward functional and medical goals.   FIM: Function - Bathing Position: Shower Body parts bathed by patient: Right arm, Left arm, Abdomen, Chest, Front perineal area, Right upper leg, Left upper leg, Right lower leg, Left lower leg, Buttocks, Back Body parts bathed by helper: Back, Buttocks Assist Level: Touching or steadying assistance(Pt > 75%)  Function- Upper Body Dressing/Undressing What is the patient wearing?: Pull over shirt/dress Pull over shirt/dress - Perfomed by patient: Thread/unthread right sleeve, Thread/unthread left sleeve, Put head through opening, Pull shirt over trunk Pull over shirt/dress - Perfomed by helper: Put head through opening Assist Level: Supervision or verbal cues, Set up Set up : To obtain clothing/put away Function - Lower  Body Dressing/Undressing What is the patient wearing?: Pants, Underwear, Socks, Shoes Position: Sitting EOB Underwear - Performed by patient: Thread/unthread left underwear leg, Pull underwear up/down, Thread/unthread right underwear leg Pants- Performed by patient: Thread/unthread right pants leg, Thread/unthread left pants leg, Pull pants up/down Pants- Performed by helper: Thread/unthread left pants leg, Thread/unthread right pants leg Socks - Performed by patient: Don/doff right sock, Don/doff left sock Socks - Performed by helper: Don/doff right sock, Don/doff left sock Shoes - Performed by patient: Don/doff right shoe, Don/doff left shoe, Fasten right, Fasten left Shoes - Performed by helper: Don/doff right shoe, Don/doff left shoe, Fasten right, Fasten left Assist for footwear: Setup Assist for lower body dressing: Touching or steadying assistance (Pt > 75%)  Function - Toileting Toileting activity did not occur: No continent bowel/bladder event Toileting steps completed by patient: Performs perineal hygiene, Adjust clothing after toileting, Adjust clothing prior to toileting Toileting steps completed by helper: Adjust clothing prior to toileting, Performs perineal hygiene, Adjust clothing after toileting Assist level: Touching or steadying assistance (Pt.75%)  Function - Air cabin crew transfer activity did not occur: N/A Toilet transfer assistive device: Elevated toilet seat/BSC over toilet, Grab bar Assist level to toilet: Touching or steadying assistance (Pt > 75%) Assist level from toilet: Touching or steadying assistance (Pt > 75%) Assist level to bedside commode (at bedside): Supervision or verbal cues Assist level from bedside commode (at bedside): Supervision or verbal cues  Function - Chair/bed transfer Chair/bed transfer method: Ambulatory Chair/bed transfer assist level: Supervision or verbal cues Chair/bed transfer assistive device: Armrests Chair/bed  transfer details: Verbal cues for precautions/safety, Verbal cues for technique  Function - Locomotion: Wheelchair Will patient use wheelchair at discharge?: No Type: Manual Max wheelchair  distance: 150 Assist Level: Supervision or verbal cues Assist Level: Supervision or verbal cues Wheel 150 feet activity did not occur: Safety/medical concerns Assist Level: Supervision or verbal cues Turns around,maneuvers to table,bed, and toilet,negotiates 3% grade,maneuvers on rugs and over doorsills: No Function - Locomotion: Ambulation Assistive device: No device Max distance: 100 Assist level: Touching or steadying assistance (Pt > 75%) Assist level: Touching or steadying assistance (Pt > 75%) Walk 50 feet with 2 turns activity did not occur: Safety/medical concerns Assist level: Touching or steadying assistance (Pt > 75%) Walk 150 feet activity did not occur: Safety/medical concerns Assist level: Touching or steadying assistance (Pt > 75%) Walk 10 feet on uneven surfaces activity did not occur: Safety/medical concerns Assist level: Maximal assist (Pt 25 - 49%) (LOB backwards)  Function - Comprehension Comprehension: Auditory Comprehension assist level: Understands basic 75 - 89% of the time/ requires cueing 10 - 24% of the time  Function - Expression Expression: Verbal Expression assist level: Expresses basic 50 - 74% of the time/requires cueing 25 - 49% of the time. Needs to repeat parts of sentences.  Function - Social Interaction Social Interaction assist level: Interacts appropriately 75 - 89% of the time - Needs redirection for appropriate language or to initiate interaction.  Function - Problem Solving Problem solving assist level: Solves basic 50 - 74% of the time/requires cueing 25 - 49% of the time  Function - Memory Memory assist level: Recognizes or recalls 50 - 74% of the time/requires cueing 25 - 49% of the time Patient normally able to recall (first 3 days only): None of  the above  Medical Problem List and Plan: 1. Functional deficits secondary to left MCA embolic infarct  - Continue CIR,home tomorrow 2.  DVT Prophylaxis/Anticoagulation: Eliquis resumed. Monitor CBC and BMET, INR is mildly elevated without warfarin, ? Etiology, no evidence of hemorrhage                                                                                                                                                                                            3. Pain Management: Tylenol as needed 4. Mood: BuSpar 15 mg twice a day, Klonopin 0.25 mg 3 times daily as needed             -may need further medication adjustment             -continued egosupport, environmental mod 5. Neuropsych: This patient is not capable of making decisions on his own behalf. 6. Skin/Wound Care: Routine skin checks 7. Fluids/Electrolytes/Nutrition: Routine I&O follow-up chemistries             -adjust h2o  flushes as indicated             -replaced potassium  -Diarrhea improved- cont Questran 8. Dysphagia. Nothing by mouth. Nasogastric tube in place. Follow-up speech therapy, patient failed most recent swallow. Continue nothing by mouth              -pt is high aspiration/pneumonia risk given dysphagia  -S/P PEG- bolus feedings initiated and appear to be tolerated thus far 9. Healthcare associated pneumonia as well as history of pulmonary fibrosis. completed Levaquin. Chronic oxygen as directed, was on 2L at rest at home and 4L with exercise             -aspiration precautions             -afebrile at present 10. Hyperlipidemia. Lopid/Pravachol 11. Atrial fibrillation:             -HR relatively well controlled at present, appropriately elevated with exercise, HR 90 this AM             -eliquis--resume today 12.  Diarrhea, stool for C diff is neg, has Crohn's, unable to give pentasa CR due to NGT, likely combination between Crohns and TF +/- antibiotic associated D/C levaquin improving on Questran,  didn't respond to lomotil, has about 2 loose stools per morning.may be able to give pentasa CR supp 13. Leukocytosis Improving  Reviewed labs personally, elevated WBC likely residual from HCAP, Crohns may also affect, monitor for fever , afebrile.    14.  Cont of bladder with low residual last reading LOS (Days) 13 A FACE TO FACE EVALUATION WAS PERFORMED  SWARTZ,ZACHARY T 02/15/2015, 8:37 AM

## 2015-02-16 ENCOUNTER — Inpatient Hospital Stay (HOSPITAL_COMMUNITY): Payer: Self-pay | Admitting: *Deleted

## 2015-02-16 ENCOUNTER — Inpatient Hospital Stay (HOSPITAL_COMMUNITY): Payer: Self-pay | Admitting: Occupational Therapy

## 2015-02-16 ENCOUNTER — Inpatient Hospital Stay (HOSPITAL_COMMUNITY): Payer: Medicare Other | Admitting: Speech Pathology

## 2015-02-16 LAB — CBC
HEMATOCRIT: 38.8 % — AB (ref 39.0–52.0)
Hemoglobin: 12.5 g/dL — ABNORMAL LOW (ref 13.0–17.0)
MCH: 31.2 pg (ref 26.0–34.0)
MCHC: 32.2 g/dL (ref 30.0–36.0)
MCV: 96.8 fL (ref 78.0–100.0)
Platelets: 342 10*3/uL (ref 150–400)
RBC: 4.01 MIL/uL — AB (ref 4.22–5.81)
RDW: 13 % (ref 11.5–15.5)
WBC: 14.3 10*3/uL — AB (ref 4.0–10.5)

## 2015-02-16 LAB — BASIC METABOLIC PANEL
Anion gap: 7 (ref 5–15)
BUN: 23 mg/dL — ABNORMAL HIGH (ref 6–20)
CHLORIDE: 103 mmol/L (ref 101–111)
CO2: 27 mmol/L (ref 22–32)
CREATININE: 0.68 mg/dL (ref 0.61–1.24)
Calcium: 8.6 mg/dL — ABNORMAL LOW (ref 8.9–10.3)
Glucose, Bld: 117 mg/dL — ABNORMAL HIGH (ref 65–99)
POTASSIUM: 4.5 mmol/L (ref 3.5–5.1)
SODIUM: 137 mmol/L (ref 135–145)

## 2015-02-16 LAB — PROTIME-INR
INR: 1.45 (ref 0.00–1.49)
Prothrombin Time: 17.7 seconds — ABNORMAL HIGH (ref 11.6–15.2)

## 2015-02-16 MED ORDER — JEVITY 1.2 CAL PO LIQD: 340.0000 mL | Freq: Every day | ORAL | Status: DC

## 2015-02-16 MED ORDER — CLONAZEPAM 0.5 MG PO TABS: 0.2500 mg | ORAL_TABLET | Freq: Three times a day (TID) | ORAL | Status: DC | PRN

## 2015-02-16 MED ORDER — GEMFIBROZIL 600 MG PO TABS
600.0000 mg | ORAL_TABLET | Freq: Two times a day (BID) | ORAL | Status: DC
Start: 2015-02-16 — End: 2015-09-17

## 2015-02-16 MED ORDER — PRAVASTATIN SODIUM 10 MG PO TABS: 10.0000 mg | ORAL_TABLET | Freq: Every day | ORAL | Status: DC

## 2015-02-16 MED ORDER — MESALAMINE 1000 MG RE SUPP: 1000.0000 mg | Freq: Two times a day (BID) | RECTAL | Status: DC

## 2015-02-16 MED ORDER — CHLORHEXIDINE GLUCONATE 0.12 % MT SOLN: 15.0000 mL | Freq: Two times a day (BID) | OROMUCOSAL | Status: DC

## 2015-02-16 MED ORDER — APIXABAN 5 MG PO TABS
5.0000 mg | ORAL_TABLET | Freq: Two times a day (BID) | ORAL | Status: DC
Start: 2015-02-16 — End: 2015-02-27

## 2015-02-16 MED ORDER — GERHARDT'S BUTT CREAM: 1.0000 "application " | TOPICAL_CREAM | Freq: Three times a day (TID) | CUTANEOUS | Status: DC

## 2015-02-16 MED ORDER — PIRFENIDONE 267 MG PO CAPS
3.0000 | ORAL_CAPSULE | Freq: Three times a day (TID) | ORAL | Status: DC
Start: 2015-02-16 — End: 2017-07-30

## 2015-02-16 MED ORDER — FREE WATER: 150.0000 mL | Freq: Three times a day (TID) | Status: DC

## 2015-02-16 MED ORDER — CHOLESTYRAMINE LIGHT 4 G PO PACK: PACK | ORAL | Status: DC

## 2015-02-16 MED ORDER — BUSPIRONE HCL 15 MG PO TABS: 15.0000 mg | ORAL_TABLET | Freq: Two times a day (BID) | ORAL | Status: DC

## 2015-02-16 MED ORDER — CLOTRIMAZOLE 1 % EX CREA: TOPICAL_CREAM | Freq: Two times a day (BID) | CUTANEOUS | Status: DC

## 2015-02-16 MED ORDER — FAMOTIDINE 40 MG PO TABS: 40.0000 mg | ORAL_TABLET | Freq: Every day | ORAL | Status: DC

## 2015-02-16 MED ORDER — CETYLPYRIDINIUM CHLORIDE 0.05 % MT LIQD: 7.0000 mL | Freq: Two times a day (BID) | OROMUCOSAL | Status: DC

## 2015-02-16 NOTE — Discharge Instructions (Signed)
Inpatient Rehab Discharge Instructions  Brian [name] Discharge date and time: No discharge date for patient encounter.   Activities/Precautions/ Functional Status: Activity: activity as tolerated Diet:  Wound Care: none needed Functional status:  ___ No restrictions     ___ Walk up steps independently _x STROKE/TIA DISCHARGE INSTRUCTIONS SMOKING Cigarette smoking nearly doubles your risk of having a stroke & is the single most alterable risk factor  If you smoke or have smoked in the last 12 months, you are advised to quit smoking for your health.  Most of the excess cardiovascular risk related to smoking disappears within a year of stopping.  Ask you doctor about anti-smoking medications  [place] Quit Line: 1-800-QUIT NOW  Free Smoking Cessation Classes [phone]  CHOLESTEROL Know your levels; limit fat & cholesterol in your diet  Lipid Panel     Component Value Date/Time   CHOL 103 02/01/2015 1050   TRIG 72 02/01/2015 1050   HDL 33* 02/01/2015 1050   CHOLHDL 3.1 02/01/2015 1050   VLDL 14 02/01/2015 1050   LDLCALC 56 02/01/2015 1050      Many patients benefit from treatment even if their cholesterol is at goal.  Goal: Total Cholesterol (CHOL) less than 160  Goal:  Triglycerides (TRIG) less than 150  Goal:  HDL greater than 40  Goal:  LDL (LDLCALC) less than 100   BLOOD PRESSURE American Stroke Association blood pressure target is less that 120/80 mm/Hg  Your discharge blood pressure is:  BP: (!) 136/97 mmHg  Monitor your blood pressure  Limit your salt and alcohol intake  Many individuals will require more than one medication for high blood pressure  DIABETES (A1c is a blood sugar average for last 3 months) Goal HGBA1c is under 7% (HBGA1c is blood sugar average for last 3 months)  Diabetes: No known diagnosis of diabetes    Lab Results  Component Value Date   HGBA1C 5.6 01/31/2015     Your HGBA1c can be lowered with medications, healthy diet, and  exercise.  Check your blood sugar as directed by your physician  Call your physician if you experience unexplained or low blood sugars.  PHYSICAL ACTIVITY/REHABILITATION Goal is 30 minutes at least 4 days per week  Activity: Increase activity slowly, Therapies: Physical Therapy: Home Health Return to work:   Activity decreases your risk of heart attack and stroke and makes your heart stronger.  It helps control your weight and blood pressure; helps you relax and can improve your mood.  Participate in a regular exercise program.  Talk with your doctor about the best form of exercise for you (dancing, walking, swimming, cycling).  DIET/WEIGHT Goal is to maintain a healthy weight  Your discharge diet is: Diet NPO time specified  liquids Your height is:    Your current weight is: Weight: 59.467 kg (131 lb 1.6 oz) Your Body Mass Index (BMI) is:     Following the type of diet specifically designed for you will help prevent another stroke.  Your goal weight range is:    Your goal Body Mass Index (BMI) is 19-24.  Healthy food habits can help reduce 3 risk factors for stroke:  High cholesterol, hypertension, and excess weight.  RESOURCES Stroke/Support Group:  Call [phone]   STROKE EDUCATION PROVIDED/REVIEWED AND GIVEN TO PATIENT Stroke warning signs and symptoms How to activate emergency medical system (call 911). Medications prescribed at discharge. Need for follow-up after discharge. Personal risk factors for stroke. Pneumonia vaccine given:  Flu vaccine given:  My questions have been answered, the writing is legible, and I understand these instructions.  I will adhere to these goals & educational materials that have been provided to me after my discharge from the hospital.    __ 24/7 supervision/assistance   ___ Walk up steps with assistance ___ Intermittent supervision/assistance  ___ Bathe/dress independently ___ Walk with walker     ___ Bathe/dress with assistance ___ Walk  Independently    ___ Shower independently _x__ Walk with assistance    ___ Shower with assistance ___ No alcohol     ___ Return to work/school ________  Special Instructions:    COMMUNITY REFERRALS UPON DISCHARGE:    Home Health:   PT, OT, RN, [place]   Date of last service:  Medical Equipment/Items Ordered: ROLLING WALKER, 3 IN 1, JEVITY TUBE  FEEDINGS, [place]  Agency/Supplier: [facility]  [phone]   GENERAL COMMUNITY RESOURCES FOR PATIENT/FAMILY: Support Groups:[facility] SUPPORT GROUP  My questions have been answered and I understand these instructions. I will adhere to these goals and the provided educational materials after my discharge from the hospital.  Patient/Caregiver Signature _______________________________ Date __________  Clinician Signature _______________________________________ Date __________  Please bring this form and your medication list with you to all your follow-up doctor's appointments.

## 2015-02-16 NOTE — Progress Notes (Signed)
Physical Therapy Discharge Summary  Patient Details  Name: Brian [name] MRN: [id number] Date of Birth: 07-Feb-1929  Today's Date: 02/16/2015 PT Individual Time: 0905-1005 PT Individual Time Calculation (min): 60 min    Patient has met 7 of 9 long term goals due to improved activity tolerance, improved balance, improved postural control, increased strength, functional use of  right upper extremity and right lower extremity and improved coordination.  Patient to discharge at an ambulatory level Supervision.   Patient's care partner is independent to provide the necessary cognitive assistance at discharge.  Reasons goals not met: Pt continues to need consistent cues for attending to R visual field to avoid collision with obstacles. Pt's wife and son have been training in safe guarding and cues, and they are able to provide this level of assistance at home.   Recommendation:  Patient will benefit from ongoing skilled PT services in home health setting to continue to advance safe functional mobility, address ongoing impairments in strength, joint tightness, cognitive remediation, NMR, R-sided attention, cardiorespiratory endurance, motor control, and minimize fall risk.  Equipment: RW wtih 5" wheels  Reasons for discharge: treatment goals met and discharge from hospital  Patient/family agrees with progress made and goals achieved: Yes   Final PT tx included family training for DC today. Wife and son present and engaged in hands-on training. They were able to safely and successfully provide cognitive assist at S level overall for gait with RW, transfers, car, stairs, curb step, and bed mobility. Pt was able to demonstrate controlled and apartment level mobility with RW and family. They were educated on functional implications of R-inattention and simple phrases. Pt was able to performed gait with RW needing only S, stairs, and standing balance tasks, but continues to desaturate 02 on 4L and family  was advised to continue monitoring throughout the day during activity and at rest. They feel confident about DC and have no further questions at this time.   PT Discharge Precautions/Restrictions Precautions Precautions: Fall Precaution Comments: NPO; aphasic Restrictions Weight Bearing Restrictions: No Vital Signs   Pain Pain Assessment Pain Assessment: No/denies pain Pain Score: 0-No pain Vision/Perception  Vision - History Baseline Vision: Wears glasses all the time Visual History: Cataracts Patient Visual Report: No change from baseline Vision - Assessment Eye Alignment: Impaired (comment) Vision Assessment: Vision impaired - to be further tested in functional context Additional Comments: Difficult to assess due to cognition and aphasia Perception Perception: Impaired Inattention/Neglect: Does not attend to right visual field Comments: difficult to test due to cognitive and communication deficits, however pt collides with obstacles on R at times Praxis Praxis: Impaired Praxis Impairment Details: Ideomotor Praxis-Other Comments: Difficult to assess, but pt unable to follow visual demonstration cues.  Cognition Overall Cognitive Status: Impaired/Different from baseline Arousal/Alertness: Awake/alert Orientation Level: Oriented X4 Attention: Focused;Sustained Focused Attention: Appears intact Sustained Attention: Impaired Sustained Attention Impairment: Functional basic Memory: Impaired Awareness: Impaired Awareness Impairment: Intellectual impairment;Emergent impairment Problem Solving: Impaired Problem Solving Impairment: Functional basic Safety/Judgment: Impaired Sensation Sensation Light Touch: Appears Intact Hot/Cold: Appears Intact Additional Comments: Difficult to test due to cognition and communication deficits Coordination Gross Motor Movements are Fluid and Coordinated: Yes Fine Motor Movements are Fluid and Coordinated: Yes Finger Nose Finger Test:  not tested as pt not able to follow directions Motor  Motor Motor: Abnormal postural alignment and control Motor - Skilled Clinical Observations: PT with poor postural control in standing, delyaed and insufficient rigthing reactions Motor - Discharge Observations: Pt's ataxia has improved, but  his balance is limited by poor postural control. Motor control is fairly even R/L  Mobility Bed Mobility Rolling Right: 5: Supervision Rolling Left: 5: Supervision Right Sidelying to Sit: 5: Supervision Transfers Transfers: Yes Sit to Stand: 5: Supervision Stand Pivot Transfers: 5: Supervision Stand Pivot Transfer Details (indicate cue type and reason): Pt continues to demonstrate posterior sway upon standing, but family trained in safe guarding techniques Locomotion  Ambulation Ambulation: Yes Ambulation/Gait Assistance: 5: Supervision Ambulation Distance (Feet): 100 Feet Assistive device: Rolling walker Ambulation/Gait Assistance Details: Cues for R-sided obstacles, upward gaze, and breathing Stairs / Additional Locomotion Stairs: Yes Stairs Assistance: 5: Supervision Stairs Assistance Details (indicate cue type and reason): S for safety Stair Management Technique: Two rails Number of Stairs: 8 Height of Stairs: 5 Curb: 5: Psychiatric nurse: Yes Wheelchair Assistance: 5: Careers information officer: Both upper extremities Wheelchair Parts Management: Needs assistance Distance: 150  Trunk/Postural Assessment  Cervical Assessment Cervical Assessment: Within Water engineer Thoracic Assessment: Within Functional Limits Lumbar Assessment Lumbar Assessment: Within Functional Limits (Functional, but hypomobile and posterior tilt/tight HS) Postural Control Postural Control: Deficits on evaluation Head Control: forward head - likely premorbid Trunk Control: slight kyphosis - likely premorbid Righting Reactions: Delayed,  stepping strategy Protective Responses: Insufficient especially posteriorly Postural Limitations: Overall flexed   Balance Static Sitting Balance Static Sitting - Balance Support: Bilateral upper extremity supported;Feet supported Static Sitting - Level of Assistance: 6: Modified independent (Device/Increase time) Dynamic Sitting Balance Dynamic Sitting - Balance Support: Bilateral upper extremity supported;Feet supported;During functional activity Dynamic Sitting - Level of Assistance: 5: Stand by assistance Static Standing Balance Static Standing - Balance Support: Right upper extremity supported;Left upper extremity supported;During functional activity Static Standing - Level of Assistance: 5: Stand by assistance Dynamic Standing Balance Dynamic Standing - Balance Support: Right upper extremity supported;Left upper extremity supported;During functional activity Dynamic Standing - Level of Assistance: 5: Stand by assistance Extremity Assessment  RUE Assessment RUE Assessment: Within Functional Limits LUE Assessment LUE Assessment: Within Functional Limits RLE Assessment RLE Assessment: Within Functional Limits (Tight HS leading to decreased balance reactions) LLE Assessment LLE Assessment: Within Functional Limits (Tight HS leading to decreased balance reactions)   See Function Navigator for Current Functional Status.   Kennieth Rad, PT, DPT  02/16/2015, 10:39 AM

## 2015-02-16 NOTE — Progress Notes (Signed)
Patient discharged home.  Left floor via wheelchair, escorted by nursing staff and family.  Family verbalized understanding of discharge instructions as given by Algis Liming, PA.  Spouse verbalizes her comfort in taking care of patient including tube feeds, medications, transfers, etc.  All patient belongings sent with patient.  Appears to be in no immediate distress at this time.  Brita Romp, RN

## 2015-02-16 NOTE — Progress Notes (Signed)
Social Work  Discharge Note  The overall goal for the admission was met for:   Discharge location: Yes-HOME WITH WIFE WHO CAN PROVIDE 24 HR CARE  Length of Stay: Yes-14 DAYS  Discharge activity level: Yes-SUPERVISION/MIN LEVEL  Home/community participation: Yes  Services provided included: MD, RD, PT, OT, SLP, RN, CM, TR, Pharmacy and SW  Financial Services: Medicare and Private Insurance: [place]  Follow-up services arranged: Home Health: [facility]-PT, OT, SP, RN, DME: [place], 3 IN 1, [facility], [place] and Patient/Family has no preference for HH/DME agencies  Comments (or additional information):WIFE WAS HERE DAILY AND PARTICIPATED IN PT'S [place] FEELS COMFORTABLE WITH AND READY TO [place]  Patient/Family verbalized understanding of follow-up arrangements: Yes  Individual responsible for coordination of the follow-up plan: WIFE-[name]  Confirmed correct DME delivered: Elease Hashimoto 02/16/2015    Elease Hashimoto

## 2015-02-16 NOTE — Progress Notes (Signed)
Occupational Therapy Discharge Summary  Patient Details  Name: Brian [name] MRN: [id number] Date of Birth: 1928/04/15  Today's Date: 02/16/2015 OT Individual Time: 2542-7062 OT Individual Time Calculation (min): 32 min   Session Note:  Worked on walk-in shower transfers with pt's spouse and son also present.  Encouraged purchase of gait belt for safe assist with transfers at this time as pt demonstrates increased posterior LOB at time.  Practiced hand held stepping over the edge of the shower walking forward as well as stepping posteriorly over the edge with the RW.  Pt has suction cup grab bar for use at home as well as a shower seat.  Pt's spouse was able to assist with both techniques with close supervision using the RW and min assist with the hand held attempt.  Pt left in bedside recliner at end of session in preparation for next session.    Patient has met 10 of 11 long term goals due to improved activity tolerance, improved balance, postural control and ability to compensate for deficits.  Patient to discharge at overall Supervision level.  Patient's care partner is independent to provide the necessary physical and cognitive assistance at discharge.    Reasons goals not met: Pt needs min to mod instructional cueing to scan to the left of midline.    Recommendation:  Patient will benefit from ongoing skilled OT services in home health setting to continue to advance functional skills in the area of BADL.  Equipment: 3:1  Reasons for discharge: treatment goals met and discharge from hospital  Patient/family agrees with progress made and goals achieved: Yes  OT Discharge Precautions/Restrictions  Precautions Precautions: Fall Precaution Comments: NPO; aphasic Restrictions Weight Bearing Restrictions: No  Vital Signs Therapy Vitals Pulse Rate: (!) 118 Resp: 18 BP: 116/67 mmHg Patient Position (if appropriate): Lying Oxygen Therapy SpO2: 100 % O2 Device: Nasal  [place] Pain Pain Assessment Pain Assessment: No/denies pain ADL ADL ADL Comments: Refer to functional navigator Vision/Perception  Vision- History Baseline Vision/History: Wears glasses Wears Glasses: At all times Patient Visual Report: Other (comment) (pt unable to state) Vision- Assessment Eye Alignment: Impaired (comment) Additional Comments: Pt with right peripheral field cut noted with functional tasks.  Did not perform formal assessment secondary to pt demonstrating decreased ability to understand and follow specific commands.   Praxis Praxis-Other Comments: Pt able to complete self care tasks without difficulty but needs cueing at times for sequencing secondary to not being in his normal environment and setting.  Cognition Overall Cognitive Status: Impaired/Different from baseline Arousal/Alertness: Awake/alert Orientation Level: Oriented X4 Attention: Sustained;Selective Focused Attention: Appears intact Sustained Attention: Appears intact Selective Attention: Appears intact Memory: Impaired Memory Impairment: Decreased recall of new information Awareness: Impaired Awareness Impairment: Anticipatory impairment Problem Solving: Impaired Problem Solving Impairment: Functional basic Executive Function: Self Monitoring;Self Correcting Self Monitoring: Impaired Self Monitoring Impairment: Verbal basic Self Correcting: Impaired Self Correcting Impairment: Verbal basic Safety/Judgment: Impaired Comments: Pt still with decreased awareness of deficits and will likely try to get up without assistance if he needs to use the bathroom.  Wife and son voice understanding that pt will need 24 hour supervision.  Pt also needing mod instructional/questioning cueing at times to sequence through gathering clothing and completing ADL.  May do better in familiar setting as he will know where clothing as kept.   Sensation Sensation Light Touch: Appears Intact Hot/Cold: Appears  Intact Additional Comments: Difficult to test due to cognition and communication deficits Coordination Gross Motor Movements are Fluid and Coordinated:  Yes Fine Motor Movements are Fluid and Coordinated: Yes Motor  Motor Motor: Abnormal postural alignment and control Mobility  Bed Mobility Rolling Right: 5: Supervision Transfers Transfers: Sit to Stand;Stand to Sit Sit to Stand: 5: Supervision Sit to Stand Details: Verbal cues for precautions/safety Stand to Sit: 5: Supervision Stand to Sit Details (indicate cue type and reason): Verbal cues for precautions/safety  Trunk/Postural Assessment  Cervical Assessment Cervical Assessment: Exceptions to [facility] (cervical protraction and flexion in sitting and standing) Thoracic Assessment Thoracic Assessment: Within Functional Limits Lumbar Assessment Lumbar Assessment: Exceptions to [facility] (pt maintains lumbar flexion )  Balance Balance Balance Assessed: Yes Static Sitting Balance Static Sitting - Balance Support: Bilateral upper extremity supported;Feet supported Static Sitting - Level of Assistance: 6: Modified independent (Device/Increase time) Dynamic Sitting Balance Dynamic Sitting - Balance Support: Bilateral upper extremity supported;Feet supported;During functional activity Dynamic Sitting - Level of Assistance: 5: Stand by assistance Static Standing Balance Static Standing - Balance Support: Right upper extremity supported;Left upper extremity supported;During functional activity Static Standing - Level of Assistance: 5: Stand by assistance Dynamic Standing Balance Dynamic Standing - Balance Support: Right upper extremity supported;Left upper extremity supported;During functional activity Dynamic Standing - Level of Assistance: 5: Stand by assistance Dynamic Standing - Comments: Pt needs UE support with dynamic standing tasks or will require intervals of min assist.   Extremity/Trunk Assessment RUE Assessment RUE Assessment:  Within Functional Limits RUE Strength RUE Overall Strength: Within Functional Limits for tasks performed LUE Assessment LUE Assessment: Within Functional Limits   See Function Navigator for Current Functional Status.  Unika Nazareno OTR/L 02/16/2015, 4:40 PM

## 2015-02-16 NOTE — Progress Notes (Signed)
Subjective/Complaints: Excited to be going home today. No problems with PEG or feeds   ROS cannot obtain due to aphasia  Objective: Vital Signs: Blood pressure 125/73, pulse 100, temperature 97.3 F (36.3 C), temperature source Oral, resp. rate 18, height 5' 11"  (1.803 m), weight 59.421 kg (131 lb), SpO2 97 %. No results found. Results for orders placed or performed during the hospital encounter of 02/02/15 (from the past 72 hour(s))  CBC     Status: Abnormal   Collection Time: 02/16/15  5:46 AM  Result Value Ref Range   WBC 14.3 (H) 4.0 - 10.5 K/uL   RBC 4.01 (L) 4.22 - 5.81 MIL/uL   Hemoglobin 12.5 (L) 13.0 - 17.0 g/dL   HCT 38.8 (L) 39.0 - 52.0 %   MCV 96.8 78.0 - 100.0 fL   MCH 31.2 26.0 - 34.0 pg   MCHC 32.2 30.0 - 36.0 g/dL   RDW 13.0 11.5 - 15.5 %   Platelets 342 150 - 400 K/uL  Protime-INR     Status: Abnormal   Collection Time: 02/16/15  5:46 AM  Result Value Ref Range   Prothrombin Time 17.7 (H) 11.6 - 15.2 seconds   INR 1.45 0.00 - 8.93  Basic metabolic panel     Status: Abnormal   Collection Time: 02/16/15  5:46 AM  Result Value Ref Range   Sodium 137 135 - 145 mmol/L   Potassium 4.5 3.5 - 5.1 mmol/L   Chloride 103 101 - 111 mmol/L   CO2 27 22 - 32 mmol/L   Glucose, Bld 117 (H) 65 - 99 mg/dL   BUN 23 (H) 6 - 20 mg/dL   Creatinine, Ser 0.68 0.61 - 1.24 mg/dL   Calcium 8.6 (L) 8.9 - 10.3 mg/dL   GFR calc non Af Amer >60 >60 mL/min   GFR calc Af Amer >60 >60 mL/min    Comment: (NOTE) The eGFR has been calculated using the CKD EPI equation. This calculation has not been validated in all clinical situations. eGFR's persistently <60 mL/min signify possible Chronic Kidney Disease.    Anion gap 7 5 - 15     Gen NAD. Vital signs reviewed HEENT: Normocephalic, atraumatic Cardio: irregular. Normal rate. Resp: CTA B/L and unlabored GI: BS positive and NT, ND. +NGT, PEG site clean and intact, no dreainage Skin:   No new skin lesions on visible skin. Neuro:  Unable to assess strength and sensation due to aphasia Receptive and exp language deficits without repitition Musc/Skel:  No pain ROM of UE or LE. No edema  Assessment/Plan: 1. Functional deficits secondary to Left MCA which require 3+ hours per day of interdisciplinary therapy in a comprehensive inpatient rehab setting. Physiatrist is providing close team supervision and 24 hour management of active medical problems listed below. Physiatrist and rehab team continue to assess barriers to discharge/monitor patient progress toward functional and medical goals.   FIM: Function - Bathing Position: Shower Body parts bathed by patient: Right arm, Left arm, Abdomen, Chest, Front perineal area, Right upper leg, Left upper leg, Right lower leg, Left lower leg, Buttocks, Back Body parts bathed by helper: Back, Buttocks Assist Level: Touching or steadying assistance(Pt > 75%)  Function- Upper Body Dressing/Undressing What is the patient wearing?: Pull over shirt/dress Pull over shirt/dress - Perfomed by patient: Thread/unthread right sleeve, Thread/unthread left sleeve, Put head through opening, Pull shirt over trunk Pull over shirt/dress - Perfomed by helper: Put head through opening Assist Level: Supervision or verbal cues, Set up Set up :  To obtain clothing/put away Function - Lower Body Dressing/Undressing What is the patient wearing?: Pants, Underwear, Socks, Shoes Position: Sitting EOB Underwear - Performed by patient: Thread/unthread left underwear leg, Pull underwear up/down, Thread/unthread right underwear leg Pants- Performed by patient: Thread/unthread right pants leg, Thread/unthread left pants leg, Pull pants up/down Pants- Performed by helper: Thread/unthread left pants leg, Thread/unthread right pants leg Socks - Performed by patient: Don/doff right sock, Don/doff left sock Socks - Performed by helper: Don/doff right sock, Don/doff left sock Shoes - Performed by patient: Don/doff  right shoe, Don/doff left shoe, Fasten right, Fasten left Shoes - Performed by helper: Don/doff right shoe, Don/doff left shoe, Fasten right, Fasten left Assist for footwear: Setup Assist for lower body dressing: Touching or steadying assistance (Pt > 75%)  Function - Toileting Toileting activity did not occur: No continent bowel/bladder event Toileting steps completed by patient: Adjust clothing prior to toileting, Adjust clothing after toileting Toileting steps completed by helper: Adjust clothing prior to toileting, Performs perineal hygiene, Adjust clothing after toileting Assist level: Touching or steadying assistance (Pt.75%)  Function - Air cabin crew transfer activity did not occur: N/A Toilet transfer assistive device: Elevated toilet seat/BSC over toilet Assist level to toilet: Touching or steadying assistance (Pt > 75%) Assist level from toilet: Touching or steadying assistance (Pt > 75%) Assist level to bedside commode (at bedside): Supervision or verbal cues Assist level from bedside commode (at bedside): Supervision or verbal cues  Function - Chair/bed transfer Chair/bed transfer method: Ambulatory Chair/bed transfer assist level: Supervision or verbal cues Chair/bed transfer assistive device: Armrests Chair/bed transfer details: Verbal cues for precautions/safety, Verbal cues for technique  Function - Locomotion: Wheelchair Will patient use wheelchair at discharge?: No Type: Manual Max wheelchair distance: 150 Assist Level: Supervision or verbal cues Assist Level: Supervision or verbal cues Wheel 150 feet activity did not occur: Safety/medical concerns Assist Level: Supervision or verbal cues Turns around,maneuvers to table,bed, and toilet,negotiates 3% grade,maneuvers on rugs and over doorsills: No Function - Locomotion: Ambulation Assistive device: No device Max distance: 100 Assist level: Touching or steadying assistance (Pt > 75%) Assist level:  Touching or steadying assistance (Pt > 75%) Walk 50 feet with 2 turns activity did not occur: Safety/medical concerns Assist level: Touching or steadying assistance (Pt > 75%) Walk 150 feet activity did not occur: Safety/medical concerns Assist level: Touching or steadying assistance (Pt > 75%) Walk 10 feet on uneven surfaces activity did not occur: Safety/medical concerns Assist level: Maximal assist (Pt 25 - 49%) (LOB backwards)  Function - Comprehension Comprehension: Auditory Comprehension assist level: Understands basic 75 - 89% of the time/ requires cueing 10 - 24% of the time  Function - Expression Expression: Verbal Expression assist level: Expresses basic 50 - 74% of the time/requires cueing 25 - 49% of the time. Needs to repeat parts of sentences.  Function - Social Interaction Social Interaction assist level: Interacts appropriately 75 - 89% of the time - Needs redirection for appropriate language or to initiate interaction.  Function - Problem Solving Problem solving assist level: Solves basic 50 - 74% of the time/requires cueing 25 - 49% of the time  Function - Memory Memory assist level: Recognizes or recalls 50 - 74% of the time/requires cueing 25 - 49% of the time Patient normally able to recall (first 3 days only): None of the above  Medical Problem List and Plan: 1. 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  DVT Prophylaxis/Anticoagulation: Eliquis resumed. Monitor CBC and BMET, INR is mildly elevated without warfarin, ? Etiology, no evidence of hemorrhage                                                                                                                                                                                            3. Pain Management: Tylenol as needed 4. Mood: BuSpar 15 mg twice a day, Klonopin 0.25 mg 3 times daily as needed                           -continued egosupport,  environmental mod 5. Neuropsych: This patient is not capable of making decisions on his own behalf. 6. Skin/Wound Care: Routine skin checks 7. Fluids/Electrolytes/Nutrition: Routine I&O follow-up chemistries             -adjust h2o flushes as indicated             -replaced potassium  -Diarrhea improved- cont Questran--taper once home 8. Dysphagia. Nothing by mouth. Nasogastric tube in place. Follow-up speech therapy, patient failed most recent swallow. Continue nothing by mouth              -pt is high aspiration/pneumonia risk given dysphagia  -S/P PEG- bolus feedings without issues/family ed 9. Healthcare associated pneumonia as well as history of pulmonary fibrosis. completed Levaquin. Chronic oxygen as directed, was on 2L at rest at home and 4L with exercise             -aspiration precautions             -afebrile at present 10. Hyperlipidemia. Lopid/Pravachol 11. Atrial fibrillation:             -HR relatively well controlled at present, appropriately elevated with exercise, HR 90's this AM             -eliquis--resumed   12.  Diarrhea--improved with questran,  Mesalamine supp 13. Leukocytosis Improving  Reviewed labs personally, elevated WBC likely residual from HCAP, Crohns may also affect, monitor for fever , afebrile.    14.  Cont of bladder with low residual last reading LOS (Days) 14 A FACE TO FACE EVALUATION WAS PERFORMED  Brian [name] T 02/16/2015, 8:42 AM

## 2015-02-16 NOTE — Progress Notes (Signed)
Speech Language Pathology Discharge Summary and Final Treatment Note  Patient Details  Name: Brian [name] MRN: [id number] Date of Birth: [birth year]  Today's Date: 02/16/2015 SLP Individual Time: 9528-4132 SLP Individual Time Calculation (min): 30 min   Skilled Therapeutic Interventions:   Skilled treatment session focused on addressing wrap up of patient and family education.  Wife aware of need for suctioning at home for oral care but had not set-up or assisted patient with completion  SLP verbally explained and wife returned demonstration.  She appropriately re-instructed patient with use of multimodal communication.  SLP also summarized handout for strategies to support communication in person's with aphasia, which patient and wife reported doing frequently; handout provided for carryover and recall.  Wife reported that she and patient felt very comfortable with pharnygeal strengthening exercises and they had no further questions.  Education complete and patient ready for discharge to next level of care.   Patient has met 3 of 3 long term goals.  Patient to discharge at overall Min;Mod level.  Reasons goals not met: n/a   Clinical Impression/Discharge Summary:  Patient has made functional gains during this rehab admission and has met 3 out of 3 long term goals due to improved swallow function and communication abilities.  Patient is currently an overall Min assist for receptive language and Mod assist for expressive multimodal communication abilities.  He also requires Mod assist for safe completion of basic cognitive tasks and Min assist for utilization of pharyngeal strengthening exercises as well as use of swallowing compensatory strategies with trials of ice chips and teaspoon sips of water to minimize overt s/s of aspiration.  Patient continues to be NPO with oral care completed via suctioning.  Patient and family education has been completed with spouse and son; patient will discharge home  with 24 hour supervision. Patient would benefit from follow up SLP services to continue efforts to maximize swallow function and cognitive-linguistic skills to maximize his functional independence and further reduce the burden of care.   Care Partner:  Caregiver Able to Provide Assistance: Yes  Type of Caregiver Assistance: Physical;Cognitive (Linguistic )  Recommendation:  Home Health SLP;24 hour supervision/assistance;Outpatient SLP  Rationale for SLP Follow Up: Maximize functional communication;Maximize cognitive function and independence;Reduce caregiver burden;Maximize swallowing safety   Equipment: Oral suctioning    Reasons for discharge: Treatment goals met;Discharged from hospital   Patient/Family Agrees with Progress Made and Goals Achieved: Yes   Function:  Eating Eating Eating activity did not occur: Safety/medical concerns               Cognition Comprehension Comprehension assist level: Understands basic 75 - 89% of the time/ requires cueing 10 - 24% of the time  Expression   Expression assist level: Expresses basic 50 - 74% of the time/requires cueing 25 - 49% of the time. Needs to repeat parts of sentences.  Social Interaction Social Interaction assist level: Interacts appropriately 75 - 89% of the time - Needs redirection for appropriate language or to initiate interaction.  Problem Solving Problem solving assist level: Solves basic 50 - 74% of the time/requires cueing 25 - 49% of the time  Memory Memory assist level: Recognizes or recalls 50 - 74% of the time/requires cueing 25 - 49% of the time   Carmelia Roller., CCC-SLP [phone]  Brian [name] 02/16/2015, 2:50 PM

## 2015-02-16 NOTE — Plan of Care (Signed)
Problem: RH Eating Goal: LTG Patient will perform eating w/assist, cues/equip (OT) LTG: Patient will perform eating with assist, with/without cues using equipment (OT)  Outcome: Not Applicable Date Met:  34/28/76 Pt still NPO at this time.  Problem: RH Vision Goal: RH LTG Vision (Specify) Outcome: Not Met (add Reason) Pt still needs min to mod instructional cueing for locating objects in right visual field.

## 2015-02-16 NOTE — Progress Notes (Signed)
Occupational Therapy Session Note  Patient Details  Name: Brian [name] MRN: [id number] Date of Birth: Oct 28, 1928  Today's Date: 02/16/2015 OT Individual Time: 1005-1058 OT Individual Time Calculation (min): 53 min    Short Term Goals: Week 2:  OT Short Term Goal 1 (Week 2): Continue working on supervision level LTGs.  Skilled Therapeutic Interventions/Progress Updates:    Pt completed bathing, dressing, and grooming tasks during session.  Pt's wife provided assistance and cueing for safety during all tasks.  Mod instructional cueing for sequencing finding clothing and completing shower.  Close supervision for all standing and sit to stand transitions using the grab bar.   Oxygen sats checked at 97% on 4Ls during dressing.  Close supervision for LB dressing sit to stand.    Therapy Documentation Precautions:  Precautions Precautions: Fall Precaution Comments: NPO; aphasic Restrictions Weight Bearing Restrictions: No  Pain: Pain Assessment Pain Assessment: No/denies pain Pain Score: 0-No pain ADL: See Function Navigator for Current Functional Status.   Therapy/Group: Individual Therapy  Effa Yarrow OTR/L 02/16/2015, 12:25 PM

## 2015-02-16 NOTE — Plan of Care (Signed)
Problem: RH SKIN INTEGRITY Goal: RH STG SKIN FREE OF INFECTION/BREAKDOWN Pt will remain free of infection and skin breakdown with mod assist  Outcome: Not Met (add Reason) Stage II to sacrum- requires max assist Goal: RH STG MAINTAIN SKIN INTEGRITY WITH ASSISTANCE STG Maintain Skin Integrity With Mod Assistance.  Outcome: Not Progressing Stage II sacrum- requires max assist.

## 2015-02-17 DIAGNOSIS — I69351 Hemiplegia and hemiparesis following cerebral infarction affecting right dominant side: Secondary | ICD-10-CM | POA: Diagnosis not present

## 2015-02-17 DIAGNOSIS — L89312 Pressure ulcer of right buttock, stage 2: Secondary | ICD-10-CM | POA: Diagnosis not present

## 2015-02-17 DIAGNOSIS — F419 Anxiety disorder, unspecified: Secondary | ICD-10-CM | POA: Diagnosis not present

## 2015-02-17 DIAGNOSIS — J449 Chronic obstructive pulmonary disease, unspecified: Secondary | ICD-10-CM | POA: Diagnosis not present

## 2015-02-17 DIAGNOSIS — I69320 Aphasia following cerebral infarction: Secondary | ICD-10-CM | POA: Diagnosis not present

## 2015-02-17 DIAGNOSIS — R1313 Dysphagia, pharyngeal phase: Secondary | ICD-10-CM | POA: Diagnosis not present

## 2015-02-19 ENCOUNTER — Telehealth: Payer: Self-pay | Admitting: Internal Medicine

## 2015-02-19 DIAGNOSIS — L89312 Pressure ulcer of right buttock, stage 2: Secondary | ICD-10-CM | POA: Diagnosis not present

## 2015-02-19 DIAGNOSIS — F419 Anxiety disorder, unspecified: Secondary | ICD-10-CM | POA: Diagnosis not present

## 2015-02-19 DIAGNOSIS — I69320 Aphasia following cerebral infarction: Secondary | ICD-10-CM | POA: Diagnosis not present

## 2015-02-19 DIAGNOSIS — I69351 Hemiplegia and hemiparesis following cerebral infarction affecting right dominant side: Secondary | ICD-10-CM | POA: Diagnosis not present

## 2015-02-19 DIAGNOSIS — R1313 Dysphagia, pharyngeal phase: Secondary | ICD-10-CM | POA: Diagnosis not present

## 2015-02-19 DIAGNOSIS — J449 Chronic obstructive pulmonary disease, unspecified: Secondary | ICD-10-CM | POA: Diagnosis not present

## 2015-02-19 NOTE — Telephone Encounter (Signed)
Please advise 

## 2015-02-19 NOTE — Telephone Encounter (Signed)
Caller name: Levada Dy  Relationship to patient: [facility]  Can be reached: [phone]   Reason for call: Arville Go wants to know if Dr. Larose Kells will sign off on patients [place] orders

## 2015-02-19 NOTE — Discharge Summary (Signed)
Discharge summary job [id number]

## 2015-02-19 NOTE — Telephone Encounter (Signed)
Last office visit within 3 months,  I will

## 2015-02-19 NOTE — Telephone Encounter (Signed)
Spoke with Levada Dy, informed her that Dr. Larose Kells would sign off on [facility] orders.

## 2015-02-19 NOTE — Discharge Summary (Signed)
Brian [name], Brian [name]                 ACCOUNT NO.:  [id number]  MEDICAL RECORD NO.:  [id number]  LOCATION:  4M07C                        FACILITY:  [facility]  PHYSICIAN:  Charlett Blake, M.D.DATE OF BIRTH:  04/11/1928  DATE OF ADMISSION:  02/02/2015 DATE OF DISCHARGE:  02/16/2015                              DISCHARGE SUMMARY   DISCHARGE DIAGNOSES: 1. Functional deficits secondary to left middle cerebral artery     embolic infarction. 2. Eliquis for deep venous thrombosis prophylaxis. 3. Depression. 4. Dysphagia, status post percutaneous endoscopic gastrostomy tube per     Interventional Radiology. 5. Healthcare-associated pneumonia, resolved. 6. Hyperlipidemia. 7. Atrial fibrillation. 8. Diarrhea with Crohn's disease, resolved.  HISTORY OF PRESENT ILLNESS:  This 81 year old right-handed male with history of pulmonary fibrosis with home oxygen, atrial fibrillation, on chronic Coumadin.  Lives with spouse.  Presented on January 31, 2015, with aphasia, right-sided weakness, and facial droop.  Cranial CT scan negative.  CTA of head and neck showed no large vessel occlusion.  INR on admission of 1.9.  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  Occlusion of distal left posterior MCA branch vessel.  Echocardiogram with ejection fraction of 60%, no wall motion abnormalities.  The patient did not receive tPA.  Neurology followup. Coumadin transitioned to Eliquis for atrial fibrillation.  Nasogastric tube for nutritional support.  The patient remained n.p.o.  Maintained on broad-spectrum antibiotics for possible healthcare-associated pneumonia, later changed to Levaquin.  He continued on home oxygen therapy.  Physical and occupational therapy ongoing.  The patient was admitted for comprehensive rehab program.  PAST MEDICAL HISTORY:  See discharge diagnoses.  SOCIAL HISTORY:  Lives with spouse, independent prior to admission. Functional status upon admission  to rehab services, was minimal assist, ambulate 100 feet, 1-person handheld assistance, minimal assist, sit to stand, min to mod assist, activities of daily living.  PHYSICAL EXAMINATION:  VITAL SIGNS:  Blood pressure 114/56, pulse 90, temperature 97, and respirations 17. GENERAL:  This was an alert male.  He was expressive receptive aphasia. Some perseveration. LUNGS:  Decreased breath sounds with occasional cough. CARDIAC:  Irregular regular. ABDOMEN:  Soft, nontender.  Good bowel sounds.  REHABILITATION HOSPITAL COURSE:  The patient was admitted to Inpatient Rehab Services with therapies initiated on a 3-hour daily basis consisting of physical therapy, occupational therapy, speech therapy, and rehabilitation nursing.  The following issues were addressed during the patient's rehabilitation stay.  Pertaining to Mr. Roupe functional deficits related to left MCA embolic infarct remained stable, he remained on Eliquis for both history of atrial fibrillation and CVA prophylaxis.  BuSpar and Klonopin for history of depression.  A PEG tube was placed per Interventional Radiology due to dysphagia.  Full family teaching in regard to PEG tube care.  He completed a course of Levaquin for healthcare-associated pneumonia remaining afebrile.  Cardiac rate well controlled.  He did have a history of colitis maintained on mesalamine.  The patient received weekly collaborative interdisciplinary team conferences to discuss estimated length of stay, family teaching, any barriers to his discharge.  Family was safely and successfully able to be brought to full family teaching as the patient was supervision level for ambulation  with a rolling walker transfers, stairs as well as curbs.  He remained on his home oxygen.  He was able to gather his belongings for activities of daily living, homemaking, needing some moderate cueing for sequencing, and finding some clothing as well as completing shower.  He  was able to express basic needs, but quite limit due to his aphasia.  He would have ongoing speech therapy.  The patient was discharged to home with ongoing therapies dictated per rehab services.  DISCHARGE MEDICATIONS:  At time of dictation included Eliquis 5 mg p.o. b.i.d., BuSpar 15 mg b.i.d., Prevalite 1 packet twice daily by tube, Klonopin 0.5 mg p.o. t.i.d. as needed, Pepcid 40 mg daily, Lopid 600 mg p.o. b.i.d., Canasa suppository 1000 mg b.i.d., pirfenidone 267 mg p.o. t.i.d., and Pravachol 10 mg p.o. daily.  His diet was Jevity 1.2 calorie 340 mL five times daily as well as free water 150 mL p.o. t.i.d. by tube.  He remained n.p.o.  He would follow up with Dr. Alysia Penna at the outpatient rehab service office as directed; Dr. Erlinda Hong, April 18, 2015; Dr. Aundra Dubin appointment for 2:00 p.m., March 12, 2015; Dr. Larose Kells, medical management.     Lauraine Rinne, P.A.   ______________________________ Charlett Blake, M.D.    DA/MEDQ  D:  02/19/2015  T:  02/19/2015  Job:  [id number]  cc:   [name], MD Loralie Champagne, MD Charlett Blake, M.D.

## 2015-02-20 ENCOUNTER — Telehealth: Payer: Self-pay | Admitting: Internal Medicine

## 2015-02-20 DIAGNOSIS — I69320 Aphasia following cerebral infarction: Secondary | ICD-10-CM | POA: Diagnosis not present

## 2015-02-20 DIAGNOSIS — I69351 Hemiplegia and hemiparesis following cerebral infarction affecting right dominant side: Secondary | ICD-10-CM | POA: Diagnosis not present

## 2015-02-20 DIAGNOSIS — J449 Chronic obstructive pulmonary disease, unspecified: Secondary | ICD-10-CM | POA: Diagnosis not present

## 2015-02-20 DIAGNOSIS — L89312 Pressure ulcer of right buttock, stage 2: Secondary | ICD-10-CM | POA: Diagnosis not present

## 2015-02-20 DIAGNOSIS — F419 Anxiety disorder, unspecified: Secondary | ICD-10-CM | POA: Diagnosis not present

## 2015-02-20 DIAGNOSIS — R1313 Dysphagia, pharyngeal phase: Secondary | ICD-10-CM | POA: Diagnosis not present

## 2015-02-20 NOTE — Telephone Encounter (Signed)
[facility] Go [facility] just saw patient following his hospitalization for CVA. Requesting 1 more visit this week and 1 visit next week.

## 2015-02-20 NOTE — Telephone Encounter (Signed)
Gave verbal ok to proceed with visits as below.

## 2015-02-22 ENCOUNTER — Encounter: Payer: Self-pay | Admitting: Physical Medicine & Rehabilitation

## 2015-02-22 DIAGNOSIS — R1313 Dysphagia, pharyngeal phase: Secondary | ICD-10-CM | POA: Diagnosis not present

## 2015-02-22 DIAGNOSIS — I69351 Hemiplegia and hemiparesis following cerebral infarction affecting right dominant side: Secondary | ICD-10-CM | POA: Diagnosis not present

## 2015-02-22 DIAGNOSIS — F419 Anxiety disorder, unspecified: Secondary | ICD-10-CM | POA: Diagnosis not present

## 2015-02-22 DIAGNOSIS — L89312 Pressure ulcer of right buttock, stage 2: Secondary | ICD-10-CM | POA: Diagnosis not present

## 2015-02-22 DIAGNOSIS — I69320 Aphasia following cerebral infarction: Secondary | ICD-10-CM | POA: Diagnosis not present

## 2015-02-22 DIAGNOSIS — J449 Chronic obstructive pulmonary disease, unspecified: Secondary | ICD-10-CM | POA: Diagnosis not present

## 2015-02-23 ENCOUNTER — Telehealth: Payer: Self-pay | Admitting: Internal Medicine

## 2015-02-23 ENCOUNTER — Telehealth: Payer: Self-pay | Admitting: Cardiology

## 2015-02-23 DIAGNOSIS — F419 Anxiety disorder, unspecified: Secondary | ICD-10-CM | POA: Diagnosis not present

## 2015-02-23 DIAGNOSIS — R1313 Dysphagia, pharyngeal phase: Secondary | ICD-10-CM | POA: Diagnosis not present

## 2015-02-23 DIAGNOSIS — J449 Chronic obstructive pulmonary disease, unspecified: Secondary | ICD-10-CM | POA: Diagnosis not present

## 2015-02-23 DIAGNOSIS — I69351 Hemiplegia and hemiparesis following cerebral infarction affecting right dominant side: Secondary | ICD-10-CM | POA: Diagnosis not present

## 2015-02-23 DIAGNOSIS — I69320 Aphasia following cerebral infarction: Secondary | ICD-10-CM | POA: Diagnosis not present

## 2015-02-23 DIAGNOSIS — L89312 Pressure ulcer of right buttock, stage 2: Secondary | ICD-10-CM | POA: Diagnosis not present

## 2015-02-23 NOTE — Telephone Encounter (Signed)
New Message    Pt's wife calling stating that pt had another stroke and while he was in the hospital he was switched from Warfarin to Eliquis. Pt's wife was calling to advise Dr. Aundra Dubin.

## 2015-02-23 NOTE — Telephone Encounter (Signed)
Caller name: Collier Flowers - Occupational Therapist   Relationship to patient:   Can be reached: [phone]    Reason for call: He called in to get a verbal order for a speech order referral to begin next week with pt.

## 2015-02-23 NOTE — Telephone Encounter (Signed)
Will forward to Dr. Aundra Dubin and Webb Silversmith.

## 2015-02-23 NOTE — Telephone Encounter (Signed)
Spoke with Jim, verbals given.  

## 2015-02-26 ENCOUNTER — Ambulatory Visit (HOSPITAL_BASED_OUTPATIENT_CLINIC_OR_DEPARTMENT_OTHER)
Admission: RE | Admit: 2015-02-26 | Discharge: 2015-02-26 | Disposition: A | Discharge status: Home / Self Care | Payer: Medicare Other | Source: Ambulatory Visit | Attending: Internal Medicine | Admitting: Internal Medicine

## 2015-02-26 ENCOUNTER — Encounter: Payer: Self-pay | Admitting: Internal Medicine

## 2015-02-26 ENCOUNTER — Ambulatory Visit (INDEPENDENT_AMBULATORY_CARE_PROVIDER_SITE_OTHER): Payer: Medicare Other | Admitting: Internal Medicine

## 2015-02-26 VITALS — BP 132/74 | HR 97 | Temp 98.0°F | Resp 18 | Ht 71.0 in | Wt 134.0 lb

## 2015-02-26 DIAGNOSIS — J189 Pneumonia, unspecified organism: Secondary | ICD-10-CM | POA: Diagnosis not present

## 2015-02-26 DIAGNOSIS — J159 Unspecified bacterial pneumonia: Secondary | ICD-10-CM

## 2015-02-26 DIAGNOSIS — I63319 Cerebral infarction due to thrombosis of unspecified middle cerebral artery: Secondary | ICD-10-CM | POA: Diagnosis not present

## 2015-02-26 DIAGNOSIS — I639 Cerebral infarction, unspecified: Secondary | ICD-10-CM | POA: Diagnosis not present

## 2015-02-26 DIAGNOSIS — T6701XD Heatstroke and sunstroke, subsequent encounter: Secondary | ICD-10-CM

## 2015-02-26 DIAGNOSIS — I10 Essential (primary) hypertension: Secondary | ICD-10-CM

## 2015-02-26 DIAGNOSIS — F419 Anxiety disorder, unspecified: Secondary | ICD-10-CM | POA: Diagnosis not present

## 2015-02-26 DIAGNOSIS — R918 Other nonspecific abnormal finding of lung field: Secondary | ICD-10-CM | POA: Insufficient documentation

## 2015-02-26 DIAGNOSIS — L8990 Pressure ulcer of unspecified site, unspecified stage: Secondary | ICD-10-CM

## 2015-02-26 DIAGNOSIS — Z09 Encounter for follow-up examination after completed treatment for conditions other than malignant neoplasm: Secondary | ICD-10-CM

## 2015-02-26 DIAGNOSIS — I4891 Unspecified atrial fibrillation: Secondary | ICD-10-CM | POA: Diagnosis not present

## 2015-02-26 DIAGNOSIS — L89312 Pressure ulcer of right buttock, stage 2: Secondary | ICD-10-CM | POA: Diagnosis not present

## 2015-02-26 DIAGNOSIS — I69320 Aphasia following cerebral infarction: Secondary | ICD-10-CM | POA: Diagnosis not present

## 2015-02-26 DIAGNOSIS — J449 Chronic obstructive pulmonary disease, unspecified: Secondary | ICD-10-CM | POA: Diagnosis not present

## 2015-02-26 DIAGNOSIS — I69351 Hemiplegia and hemiparesis following cerebral infarction affecting right dominant side: Secondary | ICD-10-CM | POA: Diagnosis not present

## 2015-02-26 DIAGNOSIS — R1313 Dysphagia, pharyngeal phase: Secondary | ICD-10-CM | POA: Diagnosis not present

## 2015-02-26 LAB — COMPREHENSIVE METABOLIC PANEL
ALBUMIN: 2.7 g/dL — AB (ref 3.5–5.2)
ALK PHOS: 150 U/L — AB (ref 39–117)
ALT: 27 U/L (ref 0–53)
AST: 30 U/L (ref 0–37)
BILIRUBIN TOTAL: 0.3 mg/dL (ref 0.2–1.2)
BUN: 19 mg/dL (ref 6–23)
CO2: 26 mEq/L (ref 19–32)
Calcium: 8.9 mg/dL (ref 8.4–10.5)
Chloride: 96 mEq/L (ref 96–112)
Creatinine, Ser: 0.75 mg/dL (ref 0.40–1.50)
GFR: 105.28 mL/min (ref >60.00)
Glucose, Bld: 170 mg/dL — ABNORMAL HIGH (ref 70–99)
POTASSIUM: 4.6 meq/L (ref 3.5–5.1)
Sodium: 131 mEq/L — ABNORMAL LOW (ref 135–145)
TOTAL PROTEIN: 8.8 g/dL — AB (ref 6.0–8.3)

## 2015-02-26 LAB — CBC WITH DIFFERENTIAL/PLATELET
BASOS ABS: 0 10*3/uL (ref 0.0–0.1)
Basophils Relative: 0.1 % (ref 0.0–3.0)
EOS PCT: 1.6 % (ref 0.0–5.0)
Eosinophils Absolute: 0.2 10*3/uL (ref 0.0–0.7)
HCT: 40.6 % (ref 39.0–52.0)
HEMOGLOBIN: 13.2 g/dL (ref 13.0–17.0)
Lymphocytes Relative: 9.9 % — ABNORMAL LOW (ref 12.0–46.0)
Lymphs Abs: 1.1 10*3/uL (ref 0.7–4.0)
MCHC: 32.5 g/dL (ref 30.0–36.0)
MCV: 95.3 fl (ref 78.0–100.0)
MONO ABS: 1 10*3/uL (ref 0.1–1.0)
MONOS PCT: 8.6 % (ref 3.0–12.0)
NEUTROS PCT: 79.8 % — AB (ref 43.0–77.0)
Neutro Abs: 8.8 10*3/uL — ABNORMAL HIGH (ref 1.4–7.7)
Platelets: 437 10*3/uL — ABNORMAL HIGH (ref 150.0–400.0)
RBC: 4.26 Mil/uL (ref 4.22–5.81)
RDW: 14.3 % (ref 11.5–15.5)
WBC: 11.1 10*3/uL — AB (ref 4.0–10.5)

## 2015-02-26 NOTE — Progress Notes (Signed)
Subjective:    Patient ID: Brian [name], male    DOB: [birth year], 81 y.o.   MRN: [id number]  DOS:  02/26/2015 Type of visit - description : Hospital follow-up Interval history: Admitted 01/31/2015 for 3 days: Was diagnosed with acute CVA, complicated by  pneumonia in the background of pulmonary fibrosis. MRI show a nonhemorrhagic posterior left MCA infarct Neurology recommend to change from warfarin to Eliquis Echo: EF 55% Had problems with aspiration and dysphagia, PANDA placed.   CXR on admission showed a right upper lobe pneumonia. s/p antibiotics.  Review of Systems Since he left the hospital he is at home with his wife. He is doing a lot of progress. He finished OT, still getting PT and ST. Mobility has  improve, right-sided weakness decreased, speech improving gradually. Currently getting all his nutrition through [facility] tube, not taking anything by mouth. BP  around 120/70 when checked. No fever chills No chest pain or difficulty breathing No nausea vomiting. Does have diarrhea they feel related to the  tube feeding. Very mild cough without sputum production. Fortunately, no anxiety or depression  Past Medical History  Diagnosis Date  . Muscle tremor     saw neurology remotely elsewhere, was Rx inderal  . Crohn's ileocolitis ([place])   . Osteopenia     per DEXA 11/2008  . Hypertension 11/11    mild  . Hemorrhoids   . Hypertrophy of prostate     w/o UR obst & oth luts  . Peripheral neuropathy ([facility])     h/o neg w/u  . ED (erectile dysfunction)   . Hypertriglyceridemia   . COPD (chronic obstructive pulmonary disease) ([place])     recent dx--no acute problems  . Hiatal hernia 1956    found while in service  . Anxiety   . Atrial fibrillation with RVR ([place])   . Long term (current) use of anticoagulants 02/21/2011  . Serrated adenoma of colon 03/2010  . Renal cyst     bilateral  . Pulmonary fibrosis ([place]) 2015    Rx Esbriet ~ 10-2013 ([name]), Dr. [name]  . On home  oxygen therapy     "2L; 20-24h for the past week" (01/26/2015)  . Emphysema lung ([place]) 2016    Dr. [name]  . B12 deficiency anemia   . Idiopathic pulmonary fibrosis ([place]) 2016    Dr. [name]  . Pneumonia ?2015  . CAP (community acquired pneumonia) 01/26/2015  . Osteoarthritis     "maybe in my hands, feet" (01/26/2015)  . Depression   . Squamous cell carcinoma of skin of scalp     tx'd ~ 71yrs; "froze them off"  . Squamous cell carcinoma, face      "froze them off"  . Melanoma of scalp [facility])     Past Surgical History  Procedure Laterality Date  . Colon resection  1984    resection of distal ileum and cecum w/ appendectomy, 18-inch small intestine, for Crohn's disease  . Colon surgery    . Appendectomy  1984  . Cataract extraction w/ intraocular lens  implant, bilateral Bilateral 2007  . Cholecystectomy  02/17/2011    Procedure: LAPAROSCOPIC CHOLECYSTECTOMY WITH INTRAOPERATIVE CHOLANGIOGRAM;  Surgeon: Edward Jolly, MD;  Location: [facility] ORS;  Service: General;  Laterality: N/A;  . Mohs surgery Right ~ 2014    "side of my scalp"    Social History   Social History  . Marital Status: Married    Spouse Name: Dalene Seltzer  . Number of Children: 4  .  Years of Education: N/A   Occupational History  . Retired   . RETIRED    Social History Main Topics  . Smoking status: Former Smoker -- 1.00 packs/day for 40 years    Types: Cigarettes, Pipe    Quit date: 03/24/1990  . Smokeless tobacco: Never Used  . Alcohol Use: No     Comment:    . Drug Use: No  . Sexual Activity: No   Other Topics Concern  . Not on file   Social History Narrative   Married, lives w/ wife         Medication List       This list is accurate as of: 02/26/15 11:59 PM.  Always use your most recent med list.               antiseptic oral rinse 0.05 % Liqd solution  Commonly known as:  CPC / CETYLPYRIDINIUM CHLORIDE 0.05%  7 mLs by Mouth Rinse route 2 times daily at 12 noon and 4 pm.     apixaban  5 MG Tabs tablet  Commonly known as:  ELIQUIS  Place 1 tablet (5 mg total) into feeding tube 2 (two) times daily.     azelastine 0.1 % nasal spray  Commonly known as:  ASTELIN  Place 1 spray into both nostrils 2 (two) times daily as needed for allergies. Use in each nostril as directed     busPIRone 15 MG tablet  Commonly known as:  BUSPAR  Place 1 tablet (15 mg total) into feeding tube 2 (two) times daily.     chlorhexidine 0.12 % solution  Commonly known as:  PERIDEX  15 mLs by Mouth Rinse route 2 (two) times daily.     cholestyramine light 4 G packet  Commonly known as:  PREVALITE  Mix package with water and place in feeding tube twice a day at noon and at 10 pm (an hour before or 4-6 hours before medications)     clonazePAM 0.5 MG tablet  Commonly known as:  KLONOPIN  Place 0.5 tablets (0.25 mg total) into feeding tube 3 (three) times daily as needed for anxiety.     clotrimazole 1 % cream  Commonly known as:  LOTRIMIN  Apply topically 2 (two) times daily.     Cyanocobalamin 500 MCG/0.1ML Soln  Commonly known as:  NASCOBAL  Use one spray nasally per week as directed.     famotidine 40 MG tablet  Commonly known as:  PEPCID  Place 1 tablet (40 mg total) into feeding tube daily.     feeding supplement (JEVITY 1.2 CAL) Liqd  Place 340 mLs into feeding tube 5 (five) times daily.     free water Soln  Place 150 mLs into feeding tube 3 (three) times daily. Use filtered water     gemfibrozil 600 MG tablet  Commonly known as:  LOPID  Place 1 tablet (600 mg total) into feeding tube 2 (two) times daily.     Gerhardt's butt cream Crea  Apply 1 application topically 3 (three) times daily.     mesalamine 1000 MG suppository  Commonly known as:  CANASA  Place 1 suppository (1,000 mg total) rectally 2 (two) times daily.     Pirfenidone 267 MG Caps  Take 3 capsules by mouth 3 (three) times daily. Put in feeding tube     pravastatin 10 MG tablet  Commonly known as:   PRAVACHOL  Place 1 tablet (10 mg total) into feeding tube daily at 6  PM.           Objective:   Physical Exam  Musculoskeletal:       Legs:  BP 132/74 mmHg  Pulse 97  Temp(Src) 98 F (36.7 C) (Oral)  Resp 18  Ht 5\' 11"  (1.803 m)  Wt 134 lb (60.782 kg)  BMI 18.70 kg/m2  SpO2 96% General:   Well developed, well nourished . NAD.  HEENT:  Normocephalic . Face symmetric, atraumatic Lungs:  Decreased breath sounds, crackles at the right upper lobe? Normal respiratory effort, no intercostal retractions, no accessory muscle use. Heart: Regular?,  no murmur.  No pretibial edema bilaterally  Skin: Not pale. Not jaundice Neurologic:  alert & oriented X3.  Speech slightly impaired, walks okay using a walker. Face symmetric, strength is actually symmetric today. Psych--  Cognition and judgment appear intact.  Cooperative with normal attention span and concentration.  Behavior appropriate. No anxious or depressed appearing.      Assessment & Plan:   Assessment > HTN Hypertriglyceridemia Anxiety, on chronic Buspar , sx controlled  Depression 01-2015 after lost a brother  Atrial fibrillation anticoagulated CVA 01-2015: Dysphagia, aphasia, R weakness, has a PANDA   Pulmonary: --Pulmonary fibrosis, home oxygen,   OV @ [name] 12-2014, stable  --pneumonia 01-2015: O2 increased to 4 lt 01-2015 (temporarily?) --Emphysema Tremors Crohn's colitis DJD Osteopenia DEXA 2010, DEXA 03-2014 T score -2.2, on calcium and vitamin D BPH Peripheral  neuropathy previous workup negative Skin cancer SCC B12 deficiency -- nascobal    Plan: Stroke: Making some improvement, still getting PT, ST at home. Still has a Panda in place. Pressure ulcer, buttocks, recommend frequent change in position and sheep skin HTN: No taking amlodipine or beta blockers, BP is well-controlled without any meds Atrial fibrillation: Coumadin was changed to eliquis after recent stroke. No taken beta blockers to  allow an appropriate BP in the setting of recent stroke. Recommend to monitor BP and heart rate, restart beta blockers if needed. See instructions. Pneumonia: Afebrile, minimal cough, still has crackles at the right upper lobe. Checking a chest x-ray RTC 1 month  Today, I spent more than  42  min with the patient: >50% of the time counseling regards prognosis of the stroke, answering a number of questions regards stroke, pressure ulcers. Also doing a extensive chart review (notes, x-rays and labs ordered by other providers) as well as coordinating his care

## 2015-02-26 NOTE — Patient Instructions (Signed)
Get your blood work before you leave   Stop by the first floor and get the XR      Check the  blood pressure and pulse (heart rate) daily. Be sure your blood pressure is between 110/65 and  145/85.  if it is consistently higher or lower, let me know   Also  call if your heart rate is more than 95 consistently.  Send me a log of your blood pressure and heart rate in 10 days   Next visit  for a  checkup in one month  (30 minutes) Please schedule an appointment at the front desk

## 2015-02-26 NOTE — Progress Notes (Signed)
Pre visit review using our clinic review tool, if applicable. No additional management support is needed unless otherwise documented below in the visit note. 

## 2015-02-27 ENCOUNTER — Telehealth: Payer: Self-pay | Admitting: Internal Medicine

## 2015-02-27 DIAGNOSIS — I69320 Aphasia following cerebral infarction: Secondary | ICD-10-CM | POA: Diagnosis not present

## 2015-02-27 DIAGNOSIS — J449 Chronic obstructive pulmonary disease, unspecified: Secondary | ICD-10-CM | POA: Diagnosis not present

## 2015-02-27 DIAGNOSIS — L89312 Pressure ulcer of right buttock, stage 2: Secondary | ICD-10-CM | POA: Diagnosis not present

## 2015-02-27 DIAGNOSIS — I69351 Hemiplegia and hemiparesis following cerebral infarction affecting right dominant side: Secondary | ICD-10-CM | POA: Diagnosis not present

## 2015-02-27 DIAGNOSIS — F419 Anxiety disorder, unspecified: Secondary | ICD-10-CM | POA: Diagnosis not present

## 2015-02-27 DIAGNOSIS — R1313 Dysphagia, pharyngeal phase: Secondary | ICD-10-CM | POA: Diagnosis not present

## 2015-02-27 MED ORDER — FAMOTIDINE 40 MG PO TABS: 40.0000 mg | ORAL_TABLET | Freq: Every day | ORAL | Status: DC

## 2015-02-27 MED ORDER — PRAVASTATIN SODIUM 10 MG PO TABS: 10.0000 mg | ORAL_TABLET | Freq: Every day | ORAL | Status: DC

## 2015-02-27 MED ORDER — APIXABAN 5 MG PO TABS: 5.0000 mg | ORAL_TABLET | Freq: Two times a day (BID) | ORAL | Status: DC

## 2015-02-27 NOTE — Telephone Encounter (Signed)
Relation to [id number] Call back number:[phone] Pharmacy: [facility], [place] [phone] (Phone) [phone] (Fax)         Reason for call:   Requesting a refill of the following [facility] pravastatin (PRAVACHOL) 10 MG tablet  famotidine (PEPCID) 40 MG tablet  apixaban (ELIQUIS) 5 MG TABS tablet

## 2015-02-27 NOTE — Telephone Encounter (Signed)
Clarify with the family, if he has been able to take the meds via PANDA tube, ok to RF, otherwise let me know

## 2015-02-27 NOTE — Telephone Encounter (Signed)
Okay taking medication through [facility], refills sent.

## 2015-02-27 NOTE — Telephone Encounter (Signed)
Okay to refill medication via feeding tube or oral?

## 2015-02-28 NOTE — Telephone Encounter (Signed)
He recently had an embolic stroke, change was made during recent hospitalization.

## 2015-02-28 NOTE — Telephone Encounter (Signed)
I will forward to CVRR 

## 2015-02-28 NOTE — Telephone Encounter (Signed)
Dr Milbert Coulter wife was calling to let you know that his warfarin was changed to Eliquis.

## 2015-02-28 NOTE — Assessment & Plan Note (Signed)
Stroke: Making some improvement, still getting PT, ST at home. Still has a Panda in place. Pressure ulcer, buttocks, recommend frequent change in position and sheep skin HTN: No taking amlodipine or beta blockers, BP is well-controlled without any meds Atrial fibrillation: Coumadin was changed to eliquis after recent stroke. No taken beta blockers to allow an appropriate BP in the setting of recent stroke. Recommend to monitor BP and heart rate, restart beta blockers if needed. See instructions. Pneumonia: Afebrile, minimal cough, still has crackles at the right upper lobe. Checking a chest x-ray RTC 1 month

## 2015-02-28 NOTE — Telephone Encounter (Signed)
Ok, will not need regular coumadin clinic followup.

## 2015-02-28 NOTE — Telephone Encounter (Signed)
New Prob   Pt was switched from Coumadin to Eliquis after a stroke on 11/9. Wife is calling to verify he does not need to come for checks anymore. Please call.

## 2015-03-01 DIAGNOSIS — R1313 Dysphagia, pharyngeal phase: Secondary | ICD-10-CM | POA: Diagnosis not present

## 2015-03-01 DIAGNOSIS — I69351 Hemiplegia and hemiparesis following cerebral infarction affecting right dominant side: Secondary | ICD-10-CM | POA: Diagnosis not present

## 2015-03-01 DIAGNOSIS — F419 Anxiety disorder, unspecified: Secondary | ICD-10-CM | POA: Diagnosis not present

## 2015-03-01 DIAGNOSIS — J449 Chronic obstructive pulmonary disease, unspecified: Secondary | ICD-10-CM | POA: Diagnosis not present

## 2015-03-01 DIAGNOSIS — L89312 Pressure ulcer of right buttock, stage 2: Secondary | ICD-10-CM | POA: Diagnosis not present

## 2015-03-01 DIAGNOSIS — I69320 Aphasia following cerebral infarction: Secondary | ICD-10-CM | POA: Diagnosis not present

## 2015-03-02 ENCOUNTER — Telehealth: Payer: Self-pay

## 2015-03-02 DIAGNOSIS — L89312 Pressure ulcer of right buttock, stage 2: Secondary | ICD-10-CM | POA: Diagnosis not present

## 2015-03-02 DIAGNOSIS — F419 Anxiety disorder, unspecified: Secondary | ICD-10-CM

## 2015-03-02 DIAGNOSIS — R1310 Dysphagia, unspecified: Secondary | ICD-10-CM

## 2015-03-02 DIAGNOSIS — I69351 Hemiplegia and hemiparesis following cerebral infarction affecting right dominant side: Secondary | ICD-10-CM | POA: Diagnosis not present

## 2015-03-02 DIAGNOSIS — J449 Chronic obstructive pulmonary disease, unspecified: Secondary | ICD-10-CM | POA: Diagnosis not present

## 2015-03-02 DIAGNOSIS — I69320 Aphasia following cerebral infarction: Secondary | ICD-10-CM | POA: Diagnosis not present

## 2015-03-02 NOTE — Telephone Encounter (Signed)
Modified barium swallow ordered .

## 2015-03-02 NOTE — Telephone Encounter (Signed)
Please order modified barium swallow at [facility]

## 2015-03-02 NOTE — Telephone Encounter (Signed)
Cathy-speech pathologist from [facility] requesting an order for a outpatient modified barium swallow. We may contact Cathy at [phone]. Please advise on order. Thanks!

## 2015-03-05 DIAGNOSIS — I69351 Hemiplegia and hemiparesis following cerebral infarction affecting right dominant side: Secondary | ICD-10-CM | POA: Diagnosis not present

## 2015-03-05 DIAGNOSIS — F419 Anxiety disorder, unspecified: Secondary | ICD-10-CM | POA: Diagnosis not present

## 2015-03-05 DIAGNOSIS — I69320 Aphasia following cerebral infarction: Secondary | ICD-10-CM | POA: Diagnosis not present

## 2015-03-05 DIAGNOSIS — L89312 Pressure ulcer of right buttock, stage 2: Secondary | ICD-10-CM | POA: Diagnosis not present

## 2015-03-05 DIAGNOSIS — R1313 Dysphagia, pharyngeal phase: Secondary | ICD-10-CM | POA: Diagnosis not present

## 2015-03-05 DIAGNOSIS — J449 Chronic obstructive pulmonary disease, unspecified: Secondary | ICD-10-CM | POA: Diagnosis not present

## 2015-03-06 ENCOUNTER — Telehealth: Payer: Self-pay | Admitting: *Deleted

## 2015-03-06 DIAGNOSIS — L89312 Pressure ulcer of right buttock, stage 2: Secondary | ICD-10-CM | POA: Diagnosis not present

## 2015-03-06 DIAGNOSIS — I69320 Aphasia following cerebral infarction: Secondary | ICD-10-CM | POA: Diagnosis not present

## 2015-03-06 DIAGNOSIS — F419 Anxiety disorder, unspecified: Secondary | ICD-10-CM | POA: Diagnosis not present

## 2015-03-06 DIAGNOSIS — J449 Chronic obstructive pulmonary disease, unspecified: Secondary | ICD-10-CM | POA: Diagnosis not present

## 2015-03-06 DIAGNOSIS — R1313 Dysphagia, pharyngeal phase: Secondary | ICD-10-CM | POA: Diagnosis not present

## 2015-03-06 DIAGNOSIS — I69351 Hemiplegia and hemiparesis following cerebral infarction affecting right dominant side: Secondary | ICD-10-CM | POA: Diagnosis not present

## 2015-03-06 NOTE — Telephone Encounter (Signed)
Forwarded to Dr. Paz. JG//CMA 

## 2015-03-07 ENCOUNTER — Telehealth: Payer: Self-pay | Admitting: Internal Medicine

## 2015-03-07 DIAGNOSIS — I639 Cerebral infarction, unspecified: Secondary | ICD-10-CM

## 2015-03-07 NOTE — Telephone Encounter (Signed)
Please do

## 2015-03-07 NOTE — Telephone Encounter (Signed)
Form faxed to [facility] successfully at [phone]. Sent for scanning. Originals mailed to pt's home address. JG//CMA

## 2015-03-07 NOTE — Telephone Encounter (Signed)
Please advise 

## 2015-03-07 NOTE — Telephone Encounter (Signed)
Caller name: Tye [place] Nurse, children's)  Relationship to patient: [facility]  Can be reached: [phone]   Reason for call: [facility] is requesting that an order for a Modified Barium Swallow be entered into Epic for this patient.

## 2015-03-08 ENCOUNTER — Other Ambulatory Visit (HOSPITAL_COMMUNITY): Payer: Self-pay | Admitting: Internal Medicine

## 2015-03-08 ENCOUNTER — Other Ambulatory Visit: Payer: Self-pay | Admitting: Internal Medicine

## 2015-03-08 DIAGNOSIS — F419 Anxiety disorder, unspecified: Secondary | ICD-10-CM | POA: Diagnosis not present

## 2015-03-08 DIAGNOSIS — I69320 Aphasia following cerebral infarction: Secondary | ICD-10-CM | POA: Diagnosis not present

## 2015-03-08 DIAGNOSIS — R1310 Dysphagia, unspecified: Secondary | ICD-10-CM

## 2015-03-08 DIAGNOSIS — L89312 Pressure ulcer of right buttock, stage 2: Secondary | ICD-10-CM | POA: Diagnosis not present

## 2015-03-08 DIAGNOSIS — J449 Chronic obstructive pulmonary disease, unspecified: Secondary | ICD-10-CM | POA: Diagnosis not present

## 2015-03-08 DIAGNOSIS — I69351 Hemiplegia and hemiparesis following cerebral infarction affecting right dominant side: Secondary | ICD-10-CM | POA: Diagnosis not present

## 2015-03-08 DIAGNOSIS — R1313 Dysphagia, pharyngeal phase: Secondary | ICD-10-CM | POA: Diagnosis not present

## 2015-03-08 NOTE — Telephone Encounter (Signed)
Order entered

## 2015-03-08 NOTE — Addendum Note (Signed)
Addended byDamita Dunnings D on: 03/08/2015 07:45 AM   Modules accepted: Orders

## 2015-03-09 ENCOUNTER — Telehealth: Payer: Self-pay | Admitting: Internal Medicine

## 2015-03-09 NOTE — Telephone Encounter (Signed)
Relation to [id number] [name],Brian Call back number:[phone]   Reason for call:  Spouse would like to discuss BP readings, please follow up with patient directly

## 2015-03-09 NOTE — Telephone Encounter (Signed)
Advise patient's wife: May be is okay, heart rate is satisfactory for now. Please call with readings in 2 weeks, will consider restart beta blockers to get the blood pressure and heart rate a little lower

## 2015-03-09 NOTE — Telephone Encounter (Signed)
Spoke with Brian [name], informed her of recommendations, she verbalized understanding. She will call back in 2 weeks w/ pulse and BP readings. She also wanted to inform Dr. Larose Kells that Pt has a visit w/ Dr. Aundra Dubin on Monday, December 19.

## 2015-03-09 NOTE — Telephone Encounter (Signed)
Spoke with Dalene Seltzer, Pt's wife, she informed me of the following BP's and pulse readings:  BP: 129/78 P: 76 BP:110/70 P:91 BP: 128/71 P:91 BP: 118/74 P: 84 BP: 116/70 P:88 BP:118/77 P:99 BP: 130/65 P:99 BP:128/80 P:64 BP: 118/64 P:94 130/74 P:94  Informed Billie that I would inform Dr. Larose Kells of BP's and pulses and will call when I receive further advice. Billie verbalized understanding.

## 2015-03-12 ENCOUNTER — Ambulatory Visit (INDEPENDENT_AMBULATORY_CARE_PROVIDER_SITE_OTHER): Payer: Medicare Other | Admitting: *Deleted

## 2015-03-12 ENCOUNTER — Ambulatory Visit (INDEPENDENT_AMBULATORY_CARE_PROVIDER_SITE_OTHER): Payer: Medicare Other | Admitting: Cardiology

## 2015-03-12 ENCOUNTER — Encounter: Payer: Self-pay | Admitting: Cardiology

## 2015-03-12 VITALS — BP 116/60 | HR 109 | Ht 71.0 in | Wt 132.0 lb

## 2015-03-12 DIAGNOSIS — Z7901 Long term (current) use of anticoagulants: Secondary | ICD-10-CM

## 2015-03-12 DIAGNOSIS — L89312 Pressure ulcer of right buttock, stage 2: Secondary | ICD-10-CM | POA: Diagnosis not present

## 2015-03-12 DIAGNOSIS — J449 Chronic obstructive pulmonary disease, unspecified: Secondary | ICD-10-CM | POA: Diagnosis not present

## 2015-03-12 DIAGNOSIS — I4891 Unspecified atrial fibrillation: Secondary | ICD-10-CM

## 2015-03-12 DIAGNOSIS — J8410 Pulmonary fibrosis, unspecified: Secondary | ICD-10-CM

## 2015-03-12 DIAGNOSIS — I69351 Hemiplegia and hemiparesis following cerebral infarction affecting right dominant side: Secondary | ICD-10-CM | POA: Diagnosis not present

## 2015-03-12 DIAGNOSIS — I69320 Aphasia following cerebral infarction: Secondary | ICD-10-CM | POA: Diagnosis not present

## 2015-03-12 DIAGNOSIS — I482 Chronic atrial fibrillation, unspecified: Secondary | ICD-10-CM

## 2015-03-12 DIAGNOSIS — R1313 Dysphagia, pharyngeal phase: Secondary | ICD-10-CM | POA: Diagnosis not present

## 2015-03-12 DIAGNOSIS — I639 Cerebral infarction, unspecified: Secondary | ICD-10-CM

## 2015-03-12 DIAGNOSIS — F419 Anxiety disorder, unspecified: Secondary | ICD-10-CM | POA: Diagnosis not present

## 2015-03-12 LAB — CBC
HEMATOCRIT: 38.9 % — AB (ref 39.0–52.0)
HEMOGLOBIN: 12.9 g/dL — AB (ref 13.0–17.0)
MCH: 30.7 pg (ref 26.0–34.0)
MCHC: 33.2 g/dL (ref 30.0–36.0)
MCV: 92.6 fL (ref 78.0–100.0)
MPV: 10.4 fL (ref 8.6–12.4)
Platelets: 468 10*3/uL — ABNORMAL HIGH (ref 150–400)
RBC: 4.2 MIL/uL — AB (ref 4.22–5.81)
RDW: 14.9 % (ref 11.5–15.5)
WBC: 10.4 10*3/uL (ref 4.0–10.5)

## 2015-03-12 LAB — BASIC METABOLIC PANEL
BUN: 22 mg/dL (ref 7–25)
CHLORIDE: 96 mmol/L — AB (ref 98–110)
CO2: 28 mmol/L (ref 20–31)
CREATININE: 0.78 mg/dL (ref 0.70–1.11)
Calcium: 9.3 mg/dL (ref 8.6–10.3)
GLUCOSE: 88 mg/dL (ref 65–99)
Potassium: 4.7 mmol/L (ref 3.5–5.3)
Sodium: 132 mmol/L — ABNORMAL LOW (ref 135–146)

## 2015-03-12 MED ORDER — NEBIVOLOL HCL 5 MG PO TABS: 5.0000 mg | ORAL_TABLET | Freq: Every day | ORAL | Status: DC

## 2015-03-12 NOTE — Progress Notes (Signed)
Pt was started on Eliquis 5mg  twice a day for AFIB on 01/31/2015.    Reviewed patients medication list.  Pt is not currently on any combined P-gp and strong CYP3A4 inhibitors/inducers (ketoconazole, traconazole, ritonavir, carbamazepine, phenytoin, rifampin, St. John's wort).  Reviewed labs: SCr-0.78, Weight-60.1 kg, Hgb-12.9, & HCT-38.9.  Dose appropriate based on dosing criteria.   Hgb and HCT Within Normal Limits  A full discussion of the nature of anticoagulants has been carried out.  A benefit/risk analysis has been presented to the patient, so that they understand the justification for choosing anticoagulation with Eliquis at this time.  The need for compliance is stressed.  Pt is aware to take the medication twice daily.  Side effects of potential bleeding are discussed, including unusual colored urine or stools, coughing up blood or coffee ground emesis, nose bleeds or serious fall or head trauma.  Discussed signs and symptoms of stroke. The patient should avoid any OTC items containing aspirin or ibuprofen.  Avoid alcohol consumption.   Call if any signs of abnormal bleeding.  Discussed financial obligations and resolved any difficulty in obtaining medication.  Next lab test test in 6 months.   03/12/15-Patient and wife present during visit.  The patient was in the hospital 02/02/15-02/16/15.  Patient and wife stated he started Eliquis 5mg  twice a day on 01/31/15.  On 03/13/15 spoke with patient's wife and advised of the patient's lab results and instructed to continue taking Eliquis 5mg  twice a day and she verbalized understanding and appointment set for 6 months.

## 2015-03-12 NOTE — Patient Instructions (Addendum)
Medication Instructions:   Increase Nebivilol to 10mg  daily if you are already taking 5 mg daily.  If you are not taking Nebivilol start 5mg  daily.  Call me and let me know which one you need, [place] [facility]  Pt's wife states pt has not been taking Nebivilol--pt will start Nebivilol 5mg  daily.   Labwork: BMET/CBCd today  Testing/Procedures: None   Follow-Up: Your physician wants you to follow-up in: 6 months with Dr Aundra Dubin. (June 2017).  You will receive a reminder letter in the mail two months in advance. If you don't receive a letter, please call our office to schedule the follow-up appointment.      If you need a refill on your cardiac medications before your next appointment, please call your pharmacy.

## 2015-03-13 ENCOUNTER — Telehealth: Payer: Self-pay | Admitting: Cardiology

## 2015-03-13 DIAGNOSIS — F419 Anxiety disorder, unspecified: Secondary | ICD-10-CM | POA: Diagnosis not present

## 2015-03-13 DIAGNOSIS — I69320 Aphasia following cerebral infarction: Secondary | ICD-10-CM | POA: Diagnosis not present

## 2015-03-13 DIAGNOSIS — I4891 Unspecified atrial fibrillation: Secondary | ICD-10-CM | POA: Insufficient documentation

## 2015-03-13 DIAGNOSIS — I69351 Hemiplegia and hemiparesis following cerebral infarction affecting right dominant side: Secondary | ICD-10-CM | POA: Diagnosis not present

## 2015-03-13 DIAGNOSIS — J449 Chronic obstructive pulmonary disease, unspecified: Secondary | ICD-10-CM | POA: Diagnosis not present

## 2015-03-13 DIAGNOSIS — R1313 Dysphagia, pharyngeal phase: Secondary | ICD-10-CM | POA: Diagnosis not present

## 2015-03-13 DIAGNOSIS — L89312 Pressure ulcer of right buttock, stage 2: Secondary | ICD-10-CM | POA: Diagnosis not present

## 2015-03-13 MED ORDER — APIXABAN 5 MG PO TABS: 5.0000 mg | ORAL_TABLET | Freq: Two times a day (BID) | ORAL | Status: DC

## 2015-03-13 NOTE — Progress Notes (Signed)
Patient ID: Brian [name], male   DOB: 27-Feb-1929, 81 y.o.   MRN: [id number] PCP: Dr. Larose Kells  81 yo with history of chronic atrial fibrillation, pulmonary fibrosis, and recent CVA presents for cardiology followup.  Patient has had atrial fibrillation for a number of years since a gallstone pancreatitis episode. He had a stroke in 11/16 and was changed from warfarin to Eliquis.  He initially had right-sided weakness with the CVA, but this has mostly resolved.  He is still having swallowing difficulty and has a PEG tub.    He is also being treated for interstitial lung disease at [facility].  He is thought to have usual interstitial pneumonia.  He is on pirfenidone.  He uses home oxygen.   Symptomatically doing ok.  Walks with walker. No dyspnea on flat ground, mild dyspnea with steps.  He had 1 episode in the last couple of months of BRBPR, thought to be from hemorrhoids. He will be getting another swallow study soon to see if he can get the PEG tube out.  Weight is down considerably compared to last appointment.   ECG: atrial fibrillation at 109 with poor RWP   Labs (10/14): K 3.8, creatinine 1.0, LDL 82, HDL 32, TGs 114 Labs (12/16): K 4.7, creatinine 0.78  PMH: 1. Chronic atrial fibrillation: Began with gallstone pancreatitis episode. Echo (11/12) with EF 55-60%, mild MR, moderate biatrial enlargement.  Echo (11/16) with EF 55-60%, mild LVH, mild MR.  2. HTN 3. Cholecystectomy after gallstone pancreatitis 4. Crohns Disease: Relatively quiescent. 5. Tremor: on propranolol since 1969.  6. BPH 7. OA 8. COPD 9. Skin cancer 10. Hyperlipidemia 11. Pneumonia in 2/15 and 11/16.  12. Interstitial lung disease: Suspect UIP, followed by a pulmonologist at [facility].  7/16 PFTs with restrictive pattern. He is on pirfenidone.   20. Left MCA CVA: 11/16.  Swallowing difficulty, required PEG tube.  14. Carotid dopplers (11/15) were normal.  SH: Married, 4 kids, retired, prior smoker but quit 1992.   FH: Father  with MI at 58  ROS: All systems reviewed and negative except as per HPI.   Current Outpatient Prescriptions  Medication Sig Dispense Refill  . antiseptic oral rinse (CPC / CETYLPYRIDINIUM CHLORIDE 0.05%) 0.05 % LIQD solution 7 mLs by Mouth Rinse route 2 times daily at 12 noon and 4 pm. 946 mL 1  . apixaban (ELIQUIS) 5 MG TABS tablet Place 1 tablet (5 mg total) into feeding tube 2 (two) times daily. 60 tablet 3  . azelastine (ASTELIN) 0.1 % nasal spray Place 1 spray into both nostrils 2 (two) times daily as needed for allergies. Use in each nostril as directed    . busPIRone (BUSPAR) 15 MG tablet Place 1 tablet (15 mg total) into feeding tube 2 (two) times daily. 60 tablet 0  . chlorhexidine (PERIDEX) 0.12 % solution 15 mLs by Mouth Rinse route 2 (two) times daily. 1800 mL 0  . cholestyramine light (PREVALITE) 4 G packet Mix package with water and place in feeding tube twice a day at noon and at 10 pm (an hour before or 4-6 hours before medications) 60 packet 1  . clonazePAM (KLONOPIN) 0.5 MG tablet Place 0.5 tablets (0.25 mg total) into feeding tube 3 (three) times daily as needed for anxiety. 40 tablet 0  . clotrimazole (LOTRIMIN) 1 % cream Apply topically 2 (two) times daily. 60 g 0  . Cyanocobalamin (NASCOBAL) 500 MCG/0.1ML SOLN Use one spray nasally per week as directed. (Patient taking differently: Place 0.1 mLs  into the nose every 7 (seven) days. Use one spray nasally per week as directed.) 3 Bottle 2  . famotidine (PEPCID) 40 MG tablet Place 1 tablet (40 mg total) into feeding tube daily. 30 tablet 3  . gemfibrozil (LOPID) 600 MG tablet Place 1 tablet (600 mg total) into feeding tube 2 (two) times daily. 180 tablet 3  . Hydrocortisone (GERHARDT'S BUTT CREAM) CREA Apply 1 application topically 3 (three) times daily. 1 each 1  . mesalamine (CANASA) 1000 MG suppository Place 1 suppository (1,000 mg total) rectally 2 (two) times daily. 30 suppository 1  . Nutritional Supplements (FEEDING  SUPPLEMENT, JEVITY 1.2 CAL,) LIQD Place 340 mLs into feeding tube 5 (five) times daily.  0  . Pirfenidone 267 MG CAPS Take 3 capsules by mouth 3 (three) times daily. Put in feeding tube 270 capsule   . pravastatin (PRAVACHOL) 10 MG tablet Place 1 tablet (10 mg total) into feeding tube daily at 6 PM. 30 tablet 3  . Water For Irrigation, Sterile (FREE WATER) SOLN Place 150 mLs into feeding tube 3 (three) times daily. Use filtered water    . nebivolol (BYSTOLIC) 5 MG tablet Take 1 tablet (5 mg total) by mouth daily. 90 tablet 3   No current facility-administered medications for this visit.    BP 116/60 mmHg  Pulse 109  Ht 5\' 11"  (1.803 m)  Wt 132 lb (59.875 kg)  BMI 18.42 kg/m2 General: NAD Neck: No JVD, no thyromegaly or thyroid nodule.  Lungs: Dry crackles at bases bilaterally. CV: Nondisplaced PMI.  Heart mildly brady, irregular S1/S2, no S3/S4, no murmur.  No edema.  No carotid bruit.  Normal pedal pulses.  Abdomen: Soft, nontender, no hepatosplenomegaly, no distention.  Skin: Intact without lesions or rashes.  Neurologic: Alert and oriented x 3.  Psych: Normal affect. Extremities: No clubbing or cyanosis.   Assessment/Plan: 1. Atrial fibrillation: Chronic, seems asymptomatic.  Rate is a bit fast today and he is no longer taking nebivolol. - Restart nebivolol 5 mg daily.  - Continue Eliquis (switched from warfarin after CVA). BMET/CBC today.  2. Pulmonary fibrosis: Continue pirfenidone and oxygen per pulmonology.   Followup in 6 months.   Loralie Champagne 03/13/2015

## 2015-03-13 NOTE — Telephone Encounter (Signed)
Pt's Rx was sent to pt's pharmacy as requested. Confirmation received.  °

## 2015-03-13 NOTE — Telephone Encounter (Signed)
New message       *STAT* If patient is at the pharmacy, call can be transferred to refill team.   1. Which medications need to be refilled? (please list name of each medication and dose if known)  eliquis 5mg   2. Which pharmacy/location (including street and city if local pharmacy) is [facility] sent to[facility]  3. Do they need a 30 day or 90 day supply? 90 day

## 2015-03-15 DIAGNOSIS — I69320 Aphasia following cerebral infarction: Secondary | ICD-10-CM | POA: Diagnosis not present

## 2015-03-15 DIAGNOSIS — F419 Anxiety disorder, unspecified: Secondary | ICD-10-CM | POA: Diagnosis not present

## 2015-03-15 DIAGNOSIS — J449 Chronic obstructive pulmonary disease, unspecified: Secondary | ICD-10-CM | POA: Diagnosis not present

## 2015-03-15 DIAGNOSIS — L89312 Pressure ulcer of right buttock, stage 2: Secondary | ICD-10-CM | POA: Diagnosis not present

## 2015-03-15 DIAGNOSIS — I69351 Hemiplegia and hemiparesis following cerebral infarction affecting right dominant side: Secondary | ICD-10-CM | POA: Diagnosis not present

## 2015-03-15 DIAGNOSIS — R1313 Dysphagia, pharyngeal phase: Secondary | ICD-10-CM | POA: Diagnosis not present

## 2015-03-16 ENCOUNTER — Telehealth: Payer: Self-pay | Admitting: Gastroenterology

## 2015-03-16 ENCOUNTER — Ambulatory Visit (INDEPENDENT_AMBULATORY_CARE_PROVIDER_SITE_OTHER): Payer: Medicare Other | Admitting: Family Medicine

## 2015-03-16 ENCOUNTER — Encounter: Payer: Self-pay | Admitting: Family Medicine

## 2015-03-16 VITALS — BP 102/70 | HR 95 | Temp 96.8°F | Ht 71.0 in | Wt 133.0 lb

## 2015-03-16 DIAGNOSIS — K50819 Crohn's disease of both small and large intestine with unspecified complications: Secondary | ICD-10-CM

## 2015-03-16 DIAGNOSIS — I639 Cerebral infarction, unspecified: Secondary | ICD-10-CM | POA: Diagnosis not present

## 2015-03-16 DIAGNOSIS — R197 Diarrhea, unspecified: Secondary | ICD-10-CM | POA: Diagnosis not present

## 2015-03-16 MED ORDER — LOPERAMIDE HCL 1 MG/5ML PO LIQD
2.0000 mg | ORAL | Status: DC | PRN
Start: 2015-03-16 — End: 2018-02-09

## 2015-03-16 NOTE — Progress Notes (Signed)
   Subjective:    Patient ID: CODA CHOTO, male    DOB: 1929-03-24, 81 y.o.   MRN: [id number]  HPI Diarrhea- family reports that pt has had this since feeding tube was inserted in November.  Hx of Crohn's and fecal incontinence.  Pt was taken off Pentasa in hospital and started on Mesalamine suppository but wife is having difficult time w/ suppository.  Pt sees Dr Fuller Plan.  No fevers.  No blood in diarrhea w/ exception of 1 time- wife thought this was due to hemorrhoids.  2 episodes of diarrhea today.  Denies cramping.  Wife states he does not complain of time.  Stools are watery.   Review of Systems For ROS see HPI     Objective:   Physical Exam  Constitutional: No distress.  Pale, frail  HENT:  Head: Normocephalic and atraumatic.  Cardiovascular:  Irregular S1/S2  Pulmonary/Chest: Effort normal and breath sounds normal. No respiratory distress. He has no wheezes. He has no rales.  Abdominal: Soft. Bowel sounds are normal. He exhibits no distension. There is no tenderness. There is no rebound and no guarding.  Neurological: He is alert.  Difficulty answering questions appropriately  Skin: Skin is warm and dry.  Vitals reviewed.         Assessment & Plan:

## 2015-03-16 NOTE — Telephone Encounter (Signed)
Pt scheduled to see Amy Esterwood PA 03/20/15@1 :30pm. Please notify pt of appt.

## 2015-03-16 NOTE — Telephone Encounter (Signed)
I left a detailed message on voicemail informing patient of this appointment and to call back to confirm this appointment.

## 2015-03-16 NOTE — Assessment & Plan Note (Signed)
New to provider, recurrent problem for pt.  Suspect this is due to a Crohn's flare.  Pt has had sxs since hospitalization in November.  Labs done 4 days ago did not show dehydration.  Start Loperamide.  Refer to GI.  Family expressed understanding and agreement.

## 2015-03-16 NOTE — Patient Instructions (Signed)
Follow up as needed We'll call you with your GI appt Start the Loperamide 2-3x/day for diarrhea (via the tube) Call with any questions or concerns Hang in there!!! Happy Holidays!!!

## 2015-03-16 NOTE — Assessment & Plan Note (Signed)
Deteriorated.  Family reports pt has been having diarrhea since stopping his oral Pentasa and starting the suppositories.  Will start Loperamide until pt can see GI.  Called pharmacy to discuss Pentasa alternatives as this is not able to be administered through G tube- were not able to come up w/ an alternative for pt.  Will defer to GI- entered stat referral.

## 2015-03-16 NOTE — Progress Notes (Signed)
Pre visit review using our clinic review tool, if applicable. No additional management support is needed unless otherwise documented below in the visit note. 

## 2015-03-16 NOTE — Telephone Encounter (Signed)
Marj is requesting that we schedule patient an appointment sometime next week. She states that patient has been having diarrhea for the past 3 to 4 days. She states that he has crohn's disease of small and large intestines w/complications. Please advise as to scheduling.

## 2015-03-20 ENCOUNTER — Ambulatory Visit (INDEPENDENT_AMBULATORY_CARE_PROVIDER_SITE_OTHER): Payer: Medicare Other | Admitting: Physician Assistant

## 2015-03-20 ENCOUNTER — Encounter: Payer: Self-pay | Admitting: Physician Assistant

## 2015-03-20 ENCOUNTER — Ambulatory Visit: Payer: Self-pay | Admitting: Physician Assistant

## 2015-03-20 ENCOUNTER — Ambulatory Visit (HOSPITAL_COMMUNITY)
Admission: RE | Admit: 2015-03-20 | Discharge: 2015-03-20 | Disposition: A | Discharge status: Home / Self Care | Payer: Medicare Other | Source: Ambulatory Visit | Attending: Internal Medicine | Admitting: Internal Medicine

## 2015-03-20 VITALS — BP 108/66 | HR 64

## 2015-03-20 DIAGNOSIS — I639 Cerebral infarction, unspecified: Secondary | ICD-10-CM | POA: Diagnosis not present

## 2015-03-20 DIAGNOSIS — R1310 Dysphagia, unspecified: Secondary | ICD-10-CM

## 2015-03-20 DIAGNOSIS — K5080 Crohn's disease of both small and large intestine without complications: Secondary | ICD-10-CM

## 2015-03-20 DIAGNOSIS — I69391 Dysphagia following cerebral infarction: Secondary | ICD-10-CM | POA: Diagnosis not present

## 2015-03-20 DIAGNOSIS — Z931 Gastrostomy status: Secondary | ICD-10-CM | POA: Diagnosis not present

## 2015-03-20 DIAGNOSIS — R197 Diarrhea, unspecified: Secondary | ICD-10-CM | POA: Diagnosis not present

## 2015-03-20 NOTE — Progress Notes (Signed)
Patient ID: Brian [name], male   DOB: 1928/12/03, 81 y.o.   MRN: [id number]   Subjective:    Patient ID: Brian [name], male    DOB: 04/15/1928, 81 y.o.   MRN: [id number]  HPI  Brian [name] is a pleasant 81 year old white male known to Brian [name] with history of Crohn's ileocolitis. He is status post remote right hemicolectomy. Last colonoscopy was done in January 2012 showing the right hemicolectomy there were no signs of active Crohn's disease one polyp was removed. Patient also has history of atrial fibrillation and had a very recent CVA for which she was hospitalized in November 2016. He is currently on eliquis. Unfortunately patient has a neurogenic dysphagia post-CVA and now has a PEG tube in place. He apparently has been having diarrhea ever since the PEG tube was placed. He is on Jevity 1.2 340 mL 5 times daily plus water. He had been on Pentasa for maintenance 500 mg twice a day - this was stopped once the PEG tube was placed. Patient denies any problems with abdominal pain or cramping. His wife says he had been having at least 3-4 fairly large volume loose to liquid stools per day. They were started on Prevalite twice daily without any benefit and then last week Imodium was added 3 times a day. His wife thinks this is helping. Thus far he has only had 1 bowel movement today. Brian [name] Patient was treated with Levaquin during his hospitalization for a pneumonia.  Review of Systems Pertinent positive and negative review of systems were noted in the above HPI section.  All other review of systems was otherwise negative.  Outpatient Encounter Prescriptions as of 03/20/2015  Medication Sig  . antiseptic oral rinse (CPC / CETYLPYRIDINIUM CHLORIDE 0.05%) 0.05 % LIQD solution 7 mLs by Mouth Rinse route 2 times daily at 12 noon and 4 pm.  . apixaban (ELIQUIS) 5 MG TABS tablet Place 1 tablet (5 mg total) into feeding tube 2 (two) times daily.  Marland Kitchen azelastine (ASTELIN) 0.1 % nasal spray Place 1 spray into both  nostrils 2 (two) times daily as needed for allergies. Use in each nostril as directed  . busPIRone (BUSPAR) 15 MG tablet Place 1 tablet (15 mg total) into feeding tube 2 (two) times daily.  . chlorhexidine (PERIDEX) 0.12 % solution 15 mLs by Mouth Rinse route 2 (two) times daily.  . cholestyramine light (PREVALITE) 4 G packet Mix package with water and place in feeding tube twice a day at noon and at 10 pm (an hour before or 4-6 hours before medications)  . clonazePAM (KLONOPIN) 0.5 MG tablet Place 0.5 tablets (0.25 mg total) into feeding tube 3 (three) times daily as needed for anxiety.  . clotrimazole (LOTRIMIN) 1 % cream Apply topically 2 (two) times daily.  . Cyanocobalamin (NASCOBAL) 500 MCG/0.1ML SOLN Use one spray nasally per week as directed. (Patient taking differently: Place 0.1 mLs into the nose every 7 (seven) days. Use one spray nasally per week as directed.)  . famotidine (PEPCID) 40 MG tablet Place 1 tablet (40 mg total) into feeding tube daily.  Marland Kitchen gemfibrozil (LOPID) 600 MG tablet Place 1 tablet (600 mg total) into feeding tube 2 (two) times daily.  . Hydrocortisone (GERHARDT'S BUTT CREAM) CREA Apply 1 application topically 3 (three) times daily.  Marland Kitchen loperamide (IMODIUM) 1 MG/5ML solution Place 10 mLs (2 mg total) into feeding tube as needed for diarrhea or loose stools.  . mesalamine (CANASA) 1000 MG suppository Place 1 suppository (1,000 mg  total) rectally 2 (two) times daily.  . nebivolol (BYSTOLIC) 5 MG tablet Take 1 tablet (5 mg total) by mouth daily.  . Nutritional Supplements (FEEDING SUPPLEMENT, JEVITY 1.2 CAL,) LIQD Place 340 mLs into feeding tube 5 (five) times daily.  . OXYGEN   . Pirfenidone 267 MG CAPS Take 3 capsules by mouth 3 (three) times daily. Put in feeding tube  . pravastatin (PRAVACHOL) 10 MG tablet Place 1 tablet (10 mg total) into feeding tube daily at 6 PM.  . Water For Irrigation, Sterile (FREE WATER) SOLN Place 150 mLs into feeding tube 3 (three) times  daily. Use filtered water   No facility-administered encounter medications on file as of 03/20/2015.   No Known Allergies Patient Active Problem List   Diagnosis Date Noted  . Atrial fibrillation with RVR ([place]) 03/13/2015  . Dysphagia   . Left middle cerebral artery stroke ([place]) 02/02/2015  . Dysphagia, pharyngoesophageal phase 02/02/2015  . Aphasia due to recent cerebrovascular accident 02/02/2015  . Bacterial pneumonia   . Stroke with cerebral ischemia ([place])   . Stroke ([place]) 01/31/2015  . CVA (cerebral infarction) 01/31/2015  . Aphasia   . Hypokalemia   . CAP (community acquired pneumonia) 01/26/2015  . Pulmonary fibrosis ([place]) 01/26/2015  . PCP NOTES >>>>>>>>>>>> 12/14/2014  . Vitamin D deficiency 11/04/2013  . Vasomotor rhinitis 09/22/2013  . Diarrhea 07/08/2013  . Crohn's disease ([place]) 07/08/2013  . Postinflammatory pulmonary fibrosis ([place]) 05/26/2013  . Anxiety state 05/20/2013  . Sprain of shoulder, left 06/01/2012  . Personal history of colonic polyps 04/12/2012  . Pulmonary nodules 03/02/2011  . Cough 03/02/2011  . Long term (current) use of anticoagulants 02/21/2011  . Atrial fibrillation with controlled ventricular response ([place]) 02/17/2011  . Annual physical exam 12/23/2010  . Dyspnea 09/03/2010  . HEMORRHOIDS-INTERNAL 03/08/2010  . [place] INTESTINE 03/08/2010  . RECTAL BLEEDING 03/08/2010  . HEMATOCHEZIA 03/06/2010  . Essential hypertension 02/04/2010  . HYPERTROPHY PROSTATE W/O UR OBST & OTH LUTS 12/10/2009  . Osteopenia 12/07/2009  . PERIPHERAL NEUROPATHY 08/29/2009  . FECAL INCONTINENCE 01/29/2009  . OSTEOARTHRITIS, ANKLE, RIGHT 11/01/2008  . ERECTILE DYSFUNCTION 12/02/2007  . TREMOR 12/02/2007  . CARCINOMA, SKIN, SQUAMOUS CELL 09/16/2006  . B12 DEFICIENCY 08/27/2006  . HYPERTRIGLYCERIDEMIA 08/27/2006   Social History   Social History  . Marital Status: Married    Spouse Name: Brian [name]  . Number of Children: 4  . Years of  Education: N/A   Occupational History  . Retired   . RETIRED    Social History Main Topics  . Smoking status: Former Smoker -- 1.00 packs/day for 40 years    Types: Cigarettes, Pipe    Quit date: 03/24/1990  . Smokeless tobacco: Never Used  . Alcohol Use: No     Comment:    . Drug Use: No  . Sexual Activity: No   Other Topics Concern  . Not on file   Social History Narrative   Married, lives w/ wife     Mr. Capozza family history includes Breast cancer in his mother; Crohn's disease in his brother; Dementia in his sister; Diabetes in his sister; Heart attack (age of onset: 42) in his father; Stroke (age of onset: 4) in his mother. There is no history of Colon cancer or Prostate cancer.      Objective:    Filed Vitals:   03/20/15 1354  BP: 108/66  Pulse: 64    Physical Exam  frail-appearing elderly white male in no acute distress,  he is in a wheelchair accompanied by his wife blood pressure 108/66 pulse 64. HEENT; nontraumatic normocephalic EOMI PERRLA sclera anicteric he has an expressive aphasia. Cardiovascular ;irregular rate and rhythm with S1-S2 no murmur or gallop, Pulmonary; clear bilaterally, Abdomen ;soft nontender nondistended bowel sounds are active there is no palpable mass or hepatosplenomegaly PEG tube is in place, Rectal; exam not done, Ext;no clubbing cyanosis or edema skin warm and dry, Neuropsych; patient is alert speech difficult to understand      Assessment & [name]:   #1 81 yo WM s/p recent CVA now requiring PEG feedings who presents with diarrhea Pt also with Crohns ileocolitis which has been quiescent #2 anticoagulation- Eliquis #3 atrial fib #4 int hemorrhoids  [name]; check stool for cdiff pcr Stop prevalite as not helping and pt on multiple meds  And absorption may be an issue  Continue Imodium TID Restart Pentasa 250mg  , 2 tabs  BID- will open capsule ,plae in 20-30 cc of water and give via PEG Follow up as needed - Dr Fuller [name]   Brian Ferguson PA-C 03/20/2015   Cc: Colon Branch, MD

## 2015-03-20 NOTE — Progress Notes (Signed)
Reviewed and agree with management plan.  Malcolm T. Stark, MD FACG 

## 2015-03-20 NOTE — Patient Instructions (Signed)
Pentasa 250 mg twice a day.   Open two capsules in about 20 cc/ML of water and give via PEG tube twice a day.   Your physician has requested that you go to the basement for lab work before leaving today.

## 2015-03-21 ENCOUNTER — Encounter: Payer: Self-pay | Admitting: *Deleted

## 2015-03-21 ENCOUNTER — Other Ambulatory Visit: Payer: Medicare Other

## 2015-03-21 DIAGNOSIS — L89312 Pressure ulcer of right buttock, stage 2: Secondary | ICD-10-CM | POA: Diagnosis not present

## 2015-03-21 DIAGNOSIS — R197 Diarrhea, unspecified: Secondary | ICD-10-CM

## 2015-03-21 DIAGNOSIS — R1313 Dysphagia, pharyngeal phase: Secondary | ICD-10-CM | POA: Diagnosis not present

## 2015-03-21 DIAGNOSIS — J449 Chronic obstructive pulmonary disease, unspecified: Secondary | ICD-10-CM | POA: Diagnosis not present

## 2015-03-21 DIAGNOSIS — I69320 Aphasia following cerebral infarction: Secondary | ICD-10-CM | POA: Diagnosis not present

## 2015-03-21 DIAGNOSIS — I69351 Hemiplegia and hemiparesis following cerebral infarction affecting right dominant side: Secondary | ICD-10-CM | POA: Diagnosis not present

## 2015-03-21 DIAGNOSIS — F419 Anxiety disorder, unspecified: Secondary | ICD-10-CM | POA: Diagnosis not present

## 2015-03-22 DIAGNOSIS — I69320 Aphasia following cerebral infarction: Secondary | ICD-10-CM | POA: Diagnosis not present

## 2015-03-22 DIAGNOSIS — I69351 Hemiplegia and hemiparesis following cerebral infarction affecting right dominant side: Secondary | ICD-10-CM | POA: Diagnosis not present

## 2015-03-22 DIAGNOSIS — R1313 Dysphagia, pharyngeal phase: Secondary | ICD-10-CM | POA: Diagnosis not present

## 2015-03-22 DIAGNOSIS — F419 Anxiety disorder, unspecified: Secondary | ICD-10-CM | POA: Diagnosis not present

## 2015-03-22 DIAGNOSIS — J449 Chronic obstructive pulmonary disease, unspecified: Secondary | ICD-10-CM | POA: Diagnosis not present

## 2015-03-22 DIAGNOSIS — L89312 Pressure ulcer of right buttock, stage 2: Secondary | ICD-10-CM | POA: Diagnosis not present

## 2015-03-22 LAB — CLOSTRIDIUM DIFFICILE BY PCR: Toxigenic C. Difficile by PCR: NOT DETECTED

## 2015-03-27 ENCOUNTER — Ambulatory Visit (HOSPITAL_BASED_OUTPATIENT_CLINIC_OR_DEPARTMENT_OTHER)
Admission: RE | Admit: 2015-03-27 | Discharge: 2015-03-27 | Disposition: A | Discharge status: Home / Self Care | Payer: Medicare Other | Source: Ambulatory Visit | Attending: Internal Medicine | Admitting: Internal Medicine

## 2015-03-27 ENCOUNTER — Ambulatory Visit (INDEPENDENT_AMBULATORY_CARE_PROVIDER_SITE_OTHER): Payer: Medicare Other | Admitting: Internal Medicine

## 2015-03-27 ENCOUNTER — Other Ambulatory Visit: Payer: Self-pay

## 2015-03-27 ENCOUNTER — Telehealth: Payer: Self-pay | Admitting: Neurology

## 2015-03-27 ENCOUNTER — Telehealth: Payer: Self-pay | Admitting: Internal Medicine

## 2015-03-27 ENCOUNTER — Encounter: Payer: Self-pay | Admitting: Internal Medicine

## 2015-03-27 VITALS — BP 132/80 | HR 96 | Temp 98.1°F | Ht 71.0 in | Wt 135.0 lb

## 2015-03-27 DIAGNOSIS — J189 Pneumonia, unspecified organism: Secondary | ICD-10-CM | POA: Diagnosis not present

## 2015-03-27 DIAGNOSIS — J159 Unspecified bacterial pneumonia: Secondary | ICD-10-CM

## 2015-03-27 DIAGNOSIS — I10 Essential (primary) hypertension: Secondary | ICD-10-CM

## 2015-03-27 DIAGNOSIS — I639 Cerebral infarction, unspecified: Secondary | ICD-10-CM | POA: Diagnosis not present

## 2015-03-27 DIAGNOSIS — K50819 Crohn's disease of both small and large intestine with unspecified complications: Secondary | ICD-10-CM | POA: Diagnosis not present

## 2015-03-27 DIAGNOSIS — R918 Other nonspecific abnormal finding of lung field: Secondary | ICD-10-CM | POA: Diagnosis not present

## 2015-03-27 DIAGNOSIS — Z0000 Encounter for general adult medical examination without abnormal findings: Secondary | ICD-10-CM | POA: Diagnosis not present

## 2015-03-27 DIAGNOSIS — R1310 Dysphagia, unspecified: Secondary | ICD-10-CM

## 2015-03-27 NOTE — Patient Instructions (Signed)
Stop by the first floor and get the XR    Next visit in 4 months    Fall Prevention and [place] cause injuries and can affect all age groups. It is possible to use preventive measures to significantly decrease the likelihood of falls. There are many simple measures which can make your home safer and prevent falls. OUTDOORS  Repair cracks and edges of walkways and driveways.  Remove high doorway thresholds.  Trim shrubbery on the main path into your home.  Have good outside lighting.  Clear walkways of tools, rocks, debris, and clutter.  Check that handrails are not broken and are securely fastened. Both sides of steps should have handrails.  Have leaves, snow, and ice cleared regularly.  Use sand or salt on walkways during winter months.  In the garage, clean up grease or oil spills. BATHROOM  Install night lights.  Install grab bars by the toilet and in the tub and shower.  Use non-skid mats or decals in the tub or shower.  Place a plastic non-slip stool in the shower to sit on, if needed.  Keep floors dry and clean up all water on the floor immediately.  Remove soap buildup in the tub or shower on a regular basis.  Secure bath mats with non-slip, double-sided rug tape.  Remove throw rugs and tripping hazards from the floors. BEDROOMS  Install night lights.  Make sure a bedside light is easy to reach.  Do not use oversized bedding.  Keep a telephone by your bedside.  Have a firm chair with side arms to use for getting dressed.  Remove throw rugs and tripping hazards from the floor. KITCHEN  Keep handles on pots and pans turned toward the center of the stove. Use back burners when possible.  Clean up spills quickly and allow time for drying.  Avoid walking on wet floors.  Avoid hot utensils and knives.  Position shelves so they are not too high or low.  Place commonly used objects within easy reach.  If necessary, use a sturdy step  stool with a grab bar when reaching.  Keep electrical cables out of the way.  Do not use floor polish or wax that makes floors slippery. If you must use wax, use non-skid floor wax.  Remove throw rugs and tripping hazards from the floor. STAIRWAYS  Never leave objects on stairs.  Place handrails on both sides of stairways and use them. Fix any loose handrails. Make sure handrails on both sides of the stairways are as long as the stairs.  Check carpeting to make sure it is firmly attached along stairs. Make repairs to worn or loose carpet promptly.  Avoid placing throw rugs at the top or bottom of stairways, or properly secure the rug with carpet tape to prevent slippage. Get rid of throw rugs, if possible.  Have an electrician put in a light switch at the top and bottom of the stairs. OTHER FALL PREVENTION TIPS  Wear low-heel or rubber-soled shoes that are supportive and fit well. Wear closed toe shoes.  When using a stepladder, make sure it is fully opened and both spreaders are firmly locked. Do not climb a closed stepladder.  Add color or contrast paint or tape to grab bars and handrails in your home. Place contrasting color strips on first and last steps.  Learn and use mobility aids as needed. Install an electrical emergency response system.  Turn on lights to avoid dark areas. Replace light bulbs that  burn out immediately. Get light switches that glow.  Arrange furniture to create clear pathways. Keep furniture in the same place.  Firmly attach carpet with non-skid or double-sided tape.  Eliminate uneven floor surfaces.  Select a carpet pattern that does not visually hide the edge of steps.  Be aware of all pets. OTHER HOME SAFETY TIPS  Set the water temperature for 120 F (48.8 C).  Keep emergency numbers on or near the telephone.  Keep smoke detectors on every level of the home and near sleeping areas. Document Released: 02/28/2002 Document Revised: 09/09/2011  Document Reviewed: 05/30/2011 [facility] Patient Information 2015 [place], [place]. This information is not intended to replace advice given to you by your health care provider. Make sure you discuss any questions you have with your health care provider.   Preventive Care for Adults Ages 5 and over  Blood pressure check.** / Every 1 to 2 years.  Lipid and cholesterol check.**/ Every 5 years beginning at age 65.  Lung cancer screening. / Every year if you are aged 45-80 years and have a 30-pack-year history of smoking and currently smoke or have quit within the past 15 years. Yearly screening is stopped once you have quit smoking for at least 15 years or develop a health problem that would prevent you from having lung cancer treatment.  Fecal occult blood test (FOBT) of stool. / Every year beginning at age 42 and continuing until age 32. You may not have to do this test if you get a colonoscopy every 10 years.  Flexible sigmoidoscopy** or colonoscopy.** / Every 5 years for a flexible sigmoidoscopy or every 10 years for a colonoscopy beginning at age 22 and continuing until age 43.  Hepatitis C blood test.** / For all people born from [birth year] through 1963 and any individual with known risks for hepatitis C.  Abdominal aortic aneurysm (AAA) screening.** / A one-time screening for ages 55 to 104 years who are current or former smokers.  Skin self-exam. / Monthly.  Influenza vaccine. / Every year.  Tetanus, diphtheria, and acellular pertussis (Tdap/Td) vaccine.** / 1 dose of Td every 10 years.  Varicella vaccine.** / Consult your health care provider.  Zoster vaccine.** / 1 dose for adults aged 75 years or older.  Pneumococcal 13-valent conjugate (PCV13) vaccine.** / Consult your health care provider.  Pneumococcal polysaccharide (PPSV23) vaccine.** / 1 dose for all adults aged 14 years and older.  Meningococcal vaccine.** / Consult your health care provider.  Hepatitis A vaccine.** /  Consult your health care provider.  Hepatitis B vaccine.** / Consult your health care provider.  Haemophilus influenzae type b (Hib) vaccine.** / Consult your health care provider. **Family history and personal history of risk and conditions may change your health care provider's recommendations. Document Released: 05/06/2001 Document Revised: 03/15/2013 Document Reviewed: 08/05/2010 [facility] Patient Information 2015 [place], [place]. This information is not intended to replace advice given to you by your health care provider. Make sure you discuss any questions you have with your health care provider.

## 2015-03-27 NOTE — Telephone Encounter (Signed)
Rn call Juliann Pulse at [facility] about stimulator for swallowing. Kathy(nurse) stated that pts wife might of misunderstood about the stimulator situation. Juliann Pulse stated pt needs a order for speech for next door neuro, and in comments its needs to have a diagnose and in comments it needs to say vitalstimNMES for swallowing. Rn explain Dr. Erlinda Hong will be notified of the referral.

## 2015-03-27 NOTE — Telephone Encounter (Signed)
Brian [name] will need to call Dr. Phoebe Sharps office back, they [facility] for her to call them back earlier today, see phone note from Neuro.

## 2015-03-27 NOTE — Telephone Encounter (Signed)
Caller name: Tye Maryland -   Relationship to patient:  Speech therapist  Can be reached: [phone]  Reason for call: She is calling in to request a written order for outpatient speech therapy with vital stim for swallowing therapy.  She is requesting that it is sent to fax#   Fax: [facility] - [phone].   Thanks.

## 2015-03-27 NOTE — Telephone Encounter (Signed)
Rn call patients wife [name] about stimulator for her husbands speech. Pt saw Dr. Erlinda Hong in the hospital, but not in outpatient. Pt currently has a feeding tube and is getting speech therapy. Pt has appt on January 25 with Dr. Erlinda Hong for hospital follow up. PTs wife gave Science writer at [facility] contact number is [phone].

## 2015-03-27 NOTE — Progress Notes (Signed)
Pre visit review using our clinic review tool, if applicable. No additional management support is needed unless otherwise documented below in the visit note. 

## 2015-03-27 NOTE — Telephone Encounter (Signed)
LFt vm for Juliann Pulse at [facility] nurse about stimulator for swallowing. Pt has appt on April 18, 2015. Need more clarification if an order is needed or referral.

## 2015-03-27 NOTE — Progress Notes (Signed)
Subjective:    Patient ID: Brian [name], male    DOB: 1928/09/18, 82 y.o.   MRN: [id number]  DOS:  03/27/2015 Type of visit - description :   Type of visit - description :    Here for Medicare AWV: 1.Risk factors based on Past M, S, F history: yes   2. Physical Activities:  Recovering from stroke, less active 3. Depression/mood: pt's son reports he may be slt depress     4. Hearing: decreased a little, at baseline not affecting ADL   5.  ADL's: less independent since stroke 01-2015, able to self care w/ help, transfers ok but needs supervision 6.  Fall Risk: increased, just got PT 7. Home Safety: does feel safe at home   8.  Height, weight, &visual acuity: see VS, vision unchanged   9.  Counseling: yes , see below   10. Labs ordered based on risk factors:  yes   11. Referral Coordination: if needed   12.  Care Plan: see a/p   13. Cognitive Assessment --motor skills decreased since stroke 01-2015 14. Care team updated 15. Written plan of care provided   16. Has a HC-POA   In addition, we discussed the following issues  Stroke: Recovering but not much progress in the last couple weeks. Dysphagia: Severe,unchange Diarrhea: Recently saw one of my partners and GI, sx improved.    Review of Systems Constitutional: No fever. No chills. No unexplained wt changes. No unusual sweats  HEENT: No dental problems, no ear discharge, no facial swelling, no voice changes. No eye discharge, no eye  redness , no  intolerance to light   Respiratory: Occasional cough, on oxygen, at baseline  Cardiovascular: No CP, no leg swelling , no  Palpitations  GI: no nausea, no vomiting, no diarrhea , no  abdominal pain.  No blood in the stools. No dysphagia, no odynophagia    Endocrine: No polyphagia, no polyuria , no polydipsia  GU: No dysuria, gross hematuria, difficulty urinating. No urinary urgency, no frequency.  Musculoskeletal: No joint swellings or unusual aches or pains  Skin: Has a  pressure ulcer, unchanged  Allergic, immunologic: No environmental allergies , no  food allergies  Neurological: Recovering from stroke, no much progress in the last 2 weeks Hematological: No enlarged lymph nodes, no easy bruising , no unusual bleedings  Psychiatry: Hard to assess due to aphasia    Past Medical History  Diagnosis Date  . Muscle tremor     saw neurology remotely elsewhere, was Rx inderal  . Crohn's ileocolitis ([place])   . Osteopenia     per DEXA 11/2008  . Hypertension 11/11    mild  . Hemorrhoids   . Hypertrophy of prostate     w/o UR obst & oth luts  . Peripheral neuropathy ([facility])     h/o neg w/u  . ED (erectile dysfunction)   . Hypertriglyceridemia   . COPD (chronic obstructive pulmonary disease) ([place])     recent dx--no acute problems  . Hiatal hernia 1956    found while in service  . Anxiety   . Atrial fibrillation with RVR ([place])   . Long term (current) use of anticoagulants 02/21/2011  . Serrated adenoma of colon 03/2010  . Renal cyst     bilateral  . Pulmonary fibrosis ([place]) 2015    Rx Esbriet ~ 10-2013 ([name]), Dr. [name]  . On home oxygen therapy     "2L; 20-24h for the past week" (01/26/2015)  .  Emphysema lung ([place]) 2016    Dr. [name]  . B12 deficiency anemia   . Idiopathic pulmonary fibrosis ([place]) 2016    Dr. [name]  . Pneumonia ?2015  . CAP (community acquired pneumonia) 01/26/2015  . Osteoarthritis     "maybe in my hands, feet" (01/26/2015)  . Depression   . Squamous cell carcinoma of skin of scalp     tx'd ~ 49yrs; "froze them off"  . Squamous cell carcinoma, face      "froze them off"  . Melanoma of scalp [facility])     Past Surgical History  Procedure Laterality Date  . Colon resection  1984    resection of distal ileum and cecum w/ appendectomy, 18-inch small intestine, for Crohn's disease  . Colon surgery    . Appendectomy  1984  . Cataract extraction w/ intraocular lens  implant, bilateral Bilateral 2007  . Cholecystectomy   02/17/2011    Procedure: LAPAROSCOPIC CHOLECYSTECTOMY WITH INTRAOPERATIVE CHOLANGIOGRAM;  Surgeon: Edward Jolly, MD;  Location: [facility]S;  Service: General;  Laterality: N/A;  . Mohs surgery Right ~ 2014    "side of my scalp"    Social History   Social History  . Marital Status: Married    Spouse Name: Dalene Seltzer  . Number of Children: 4  . Years of Education: N/A   Occupational History  . Retired   . RETIRED    Social History Main Topics  . Smoking status: Former Smoker -- 1.00 packs/day for 40 years    Types: Cigarettes, Pipe    Quit date: 03/24/1990  . Smokeless tobacco: Never Used  . Alcohol Use: No     Comment:    . Drug Use: No  . Sexual Activity: No   Other Topics Concern  . Not on file   Social History Narrative   Married, lives w/ wife      Family History  Problem Relation Age of Onset  . Crohn's disease Brother   . Breast cancer Mother   . Colon cancer Neg Hx   . Prostate cancer Neg Hx   . Heart attack Father 39  . Dementia Sister   . Diabetes Sister   . Stroke Mother 31       Medication List       This list is accurate as of: 03/27/15  6:36 PM.  Always use your most recent med list.               antiseptic oral rinse 0.05 % Liqd solution  Commonly known as:  CPC / CETYLPYRIDINIUM CHLORIDE 0.05%  7 mLs by Mouth Rinse route 2 times daily at 12 noon and 4 pm.     apixaban 5 MG Tabs tablet  Commonly known as:  ELIQUIS  Place 1 tablet (5 mg total) into feeding tube 2 (two) times daily.     azelastine 0.1 % nasal spray  Commonly known as:  ASTELIN  Place 1 spray into both nostrils 2 (two) times daily as needed for allergies. Use in each nostril as directed     busPIRone 15 MG tablet  Commonly known as:  BUSPAR  Place 1 tablet (15 mg total) into feeding tube 2 (two) times daily.     chlorhexidine 0.12 % solution  Commonly known as:  PERIDEX  15 mLs by Mouth Rinse route 2 (two) times daily.     cholestyramine light 4 g packet  Commonly  known as:  PREVALITE  Mix package with water and place in  feeding tube twice a day at noon and at 10 pm (an hour before or 4-6 hours before medications)     clonazePAM 0.5 MG tablet  Commonly known as:  KLONOPIN  Place 0.5 tablets (0.25 mg total) into feeding tube 3 (three) times daily as needed for anxiety.     clotrimazole 1 % cream  Commonly known as:  LOTRIMIN  Apply topically 2 (two) times daily.     Cyanocobalamin 500 MCG/0.1ML Soln  Commonly known as:  NASCOBAL  Use one spray nasally per week as directed.     famotidine 40 MG tablet  Commonly known as:  PEPCID  Place 1 tablet (40 mg total) into feeding tube daily.     feeding supplement (JEVITY 1.2 CAL) Liqd  Place 340 mLs into feeding tube 5 (five) times daily.     free water Soln  Place 150 mLs into feeding tube 3 (three) times daily. Use filtered water     gemfibrozil 600 MG tablet  Commonly known as:  LOPID  Place 1 tablet (600 mg total) into feeding tube 2 (two) times daily.     Gerhardt's butt cream Crea  Apply 1 application topically 3 (three) times daily.     loperamide 1 MG/5ML solution  Commonly known as:  IMODIUM  Place 10 mLs (2 mg total) into feeding tube as needed for diarrhea or loose stools.     nebivolol 5 MG tablet  Commonly known as:  BYSTOLIC  Take 1 tablet (5 mg total) by mouth daily.     OXYGEN     PENTASA PO  Give 2 capsules by tube daily.     Pirfenidone 267 MG Caps  Take 3 capsules by mouth 3 (three) times daily. Put in feeding tube     pravastatin 10 MG tablet  Commonly known as:  PRAVACHOL  Place 1 tablet (10 mg total) into feeding tube daily at 6 PM.           Objective:   Physical Exam  Skin:      BP 132/80 mmHg  Pulse 96  Temp(Src) 98.1 F (36.7 C) (Oral)  Ht 5\' 11"  (1.803 m)  Wt 135 lb (61.236 kg)  BMI 18.84 kg/m2  SpO2 97% General:   Well developed, well nourished . NAD.  HEENT:  Normocephalic . Face symmetric, atraumatic Lungs:  CTA B Normal  respiratory effort, no intercostal retractions, no accessory muscle use. Heart:  irregular  no murmur.  no pretibial edema bilaterally  Abdomen:  Not distended, soft, non-tender. No rebound or rigidity.  Skin: Not pale. Not jaundice Neurologic:  Alert , severe aphasia  Speech slurred, walks assisted by a walker.  Motor symmetric Psych--  No anxious or depressed appearing.    Assessment & Plan:   Assessment > HTN Hypertriglyceridemia Anxiety, on chronic Buspar , sx controlled  Depression 01-2015 after lost a brother  Atrial fibrillation anticoagulated CVA 01-2015: Dysphagia, aphasia, R weakness, has a PANDA   Pulmonary: --Pulmonary fibrosis, home oxygen,   OV @ [name] 12-2014, stable  --pneumonia 01-2015: O2 increased to 4 lt 01-2015 (temporarily?) --Emphysema Tremors Crohn's colitis DJD Osteopenia DEXA 2010, DEXA 03-2014 T score -2.2, on calcium and vitamin D BPH Peripheral  neuropathy previous workup negative Skin cancer SCC B12 deficiency -- nascobal  H/o palpable abdominal aorta --- CT of the abdomen 06/2013 no AAA  Plan: Stroke: severe aphasia, not making much progress, likely reaching a plateau in terms of recovery  Pressure ulcer: no major complications at  this time, watch for infex, worsening; rec freq turning and sheep skin Severe dysphagia, PANDA: [facility] Vinnie Level  recommend to contact neurology to see about treatment of  Dysphagia. See phone note from neurology 03/27/2015 HTN: Back on beta blockers, BP excellent Atrial fibrillation: Seems stable Pneumonia: Recheck a chest x-ray  Crohn's disease: Saw GI, C. difficile negative, back on Pentasa by PEG tube. Better RTC 4 months

## 2015-03-27 NOTE — Assessment & Plan Note (Addendum)
Td 2012, Pneumonia shot 2009; had a prevnar per pt; zostavax- 2013; had a flu shot  colonoscopy in 2007, 04/2008, 03-2010. At this point screening colonoscopies not longer indicated  prostate cancer screening --no longer indicated

## 2015-03-27 NOTE — Telephone Encounter (Signed)
Patients wife [name]) called and said that nurse from [place] said that patient needs to get on stimulator ASAP for swallowing. Patient has an appt with Dr Erlinda Hong 04/18/15. Please call patient wife. Thanks dg

## 2015-03-27 NOTE — Telephone Encounter (Signed)
Referral sent via epic to Speech Therapy to neuro rehab at [phone].

## 2015-03-28 DIAGNOSIS — L89312 Pressure ulcer of right buttock, stage 2: Secondary | ICD-10-CM | POA: Diagnosis not present

## 2015-03-28 DIAGNOSIS — R1313 Dysphagia, pharyngeal phase: Secondary | ICD-10-CM | POA: Diagnosis not present

## 2015-03-28 DIAGNOSIS — F419 Anxiety disorder, unspecified: Secondary | ICD-10-CM | POA: Diagnosis not present

## 2015-03-28 DIAGNOSIS — I69320 Aphasia following cerebral infarction: Secondary | ICD-10-CM | POA: Diagnosis not present

## 2015-03-28 DIAGNOSIS — I69351 Hemiplegia and hemiparesis following cerebral infarction affecting right dominant side: Secondary | ICD-10-CM | POA: Diagnosis not present

## 2015-03-28 DIAGNOSIS — J449 Chronic obstructive pulmonary disease, unspecified: Secondary | ICD-10-CM | POA: Diagnosis not present

## 2015-03-29 ENCOUNTER — Encounter: Payer: Medicare Other | Attending: Physical Medicine & Rehabilitation

## 2015-03-29 ENCOUNTER — Encounter: Payer: Self-pay | Admitting: Physical Medicine & Rehabilitation

## 2015-03-29 ENCOUNTER — Ambulatory Visit (HOSPITAL_BASED_OUTPATIENT_CLINIC_OR_DEPARTMENT_OTHER): Payer: Medicare Other | Admitting: Physical Medicine & Rehabilitation

## 2015-03-29 VITALS — BP 133/79 | HR 82 | Resp 14

## 2015-03-29 DIAGNOSIS — R269 Unspecified abnormalities of gait and mobility: Secondary | ICD-10-CM | POA: Insufficient documentation

## 2015-03-29 DIAGNOSIS — N529 Male erectile dysfunction, unspecified: Secondary | ICD-10-CM | POA: Insufficient documentation

## 2015-03-29 DIAGNOSIS — J449 Chronic obstructive pulmonary disease, unspecified: Secondary | ICD-10-CM | POA: Diagnosis not present

## 2015-03-29 DIAGNOSIS — K5010 Crohn's disease of large intestine without complications: Secondary | ICD-10-CM | POA: Diagnosis not present

## 2015-03-29 DIAGNOSIS — R1314 Dysphagia, pharyngoesophageal phase: Secondary | ICD-10-CM | POA: Diagnosis not present

## 2015-03-29 DIAGNOSIS — Z85828 Personal history of other malignant neoplasm of skin: Secondary | ICD-10-CM | POA: Insufficient documentation

## 2015-03-29 DIAGNOSIS — I69920 Aphasia following unspecified cerebrovascular disease: Secondary | ICD-10-CM

## 2015-03-29 DIAGNOSIS — Z7901 Long term (current) use of anticoagulants: Secondary | ICD-10-CM | POA: Insufficient documentation

## 2015-03-29 DIAGNOSIS — R251 Tremor, unspecified: Secondary | ICD-10-CM | POA: Insufficient documentation

## 2015-03-29 DIAGNOSIS — I639 Cerebral infarction, unspecified: Secondary | ICD-10-CM

## 2015-03-29 DIAGNOSIS — I69320 Aphasia following cerebral infarction: Secondary | ICD-10-CM

## 2015-03-29 DIAGNOSIS — N400 Enlarged prostate without lower urinary tract symptoms: Secondary | ICD-10-CM | POA: Insufficient documentation

## 2015-03-29 DIAGNOSIS — I10 Essential (primary) hypertension: Secondary | ICD-10-CM | POA: Diagnosis not present

## 2015-03-29 DIAGNOSIS — Z87891 Personal history of nicotine dependence: Secondary | ICD-10-CM | POA: Diagnosis not present

## 2015-03-29 DIAGNOSIS — G629 Polyneuropathy, unspecified: Secondary | ICD-10-CM | POA: Insufficient documentation

## 2015-03-29 DIAGNOSIS — E781 Pure hyperglyceridemia: Secondary | ICD-10-CM | POA: Insufficient documentation

## 2015-03-29 DIAGNOSIS — I69398 Other sequelae of cerebral infarction: Secondary | ICD-10-CM | POA: Diagnosis not present

## 2015-03-29 NOTE — Progress Notes (Signed)
Subjective:    Patient ID: Brian [name], male    DOB: 1928/11/20, 82 y.o.   MRN: [id number]  HPI  82 year old male with history of left posterior MCA distribution infarct causing right-sided weakness as well as swallowing difficulties and a fascia. He has difficulty with comprehension primarily. He still has a gastrostomy tube for feeding. He had a repeat modified barium swallow test on 03/20/2015 which showed no improvement in the severe swallowing impairment. He has completed home health PT OT and speech and will be starting outpatient speech. He is dressing himself and bathing himself. He ambulates with a walker he did not use a walker prior to his stroke.   Pain Inventory Average Pain 0 Pain Right Now 0 My pain is no pain  In the last 24 hours, has pain interfered with the following? General activity 0 Relation with others 0 Enjoyment of life 0 What TIME of day is your pain at its worst? no pain Sleep (in general) Good  Pain is worse with: no pain Pain improves with: no pain Relief from Meds: no pain  Mobility walk with assistance use a walker ability to climb steps?  yes  Function retired  Neuro/Psych No problems in this area  Prior Studies Any changes since last visit?  no x-rays  Physicians involved in your care Any changes since last visit?  no   Family History  Problem Relation Age of Onset  . Crohn's disease Brother   . Breast cancer Mother   . Colon cancer Neg Hx   . Prostate cancer Neg Hx   . Heart attack Father 68  . Dementia Sister   . Diabetes Sister   . Stroke Mother 48   Social History   Social History  . Marital Status: Married    Spouse Name: Dalene Seltzer  . Number of Children: 4  . Years of Education: N/A   Occupational History  . Retired   . RETIRED    Social History Main Topics  . Smoking status: Former Smoker -- 1.00 packs/day for 40 years    Types: Cigarettes, Pipe    Quit date: 03/24/1990  . Smokeless tobacco: Never Used    . Alcohol Use: No     Comment:    . Drug Use: No  . Sexual Activity: No   Other Topics Concern  . None   Social History Narrative   Married, lives w/ wife    Past Surgical History  Procedure Laterality Date  . Colon resection  1984    resection of distal ileum and cecum w/ appendectomy, 18-inch small intestine, for Crohn's disease  . Colon surgery    . Appendectomy  1984  . Cataract extraction w/ intraocular lens  implant, bilateral Bilateral 2007  . Cholecystectomy  02/17/2011    Procedure: LAPAROSCOPIC CHOLECYSTECTOMY WITH INTRAOPERATIVE CHOLANGIOGRAM;  Surgeon: Edward Jolly, MD;  Location: [facility];  Service: General;  Laterality: N/A;  . Mohs surgery Right ~ 2014    "side of my scalp"   Past Medical History  Diagnosis Date  . Muscle tremor     saw neurology remotely elsewhere, was Rx inderal  . Crohn's ileocolitis ([place])   . Osteopenia     per DEXA 11/2008  . Hypertension 11/11    mild  . Hemorrhoids   . Hypertrophy of prostate     w/o UR obst & oth luts  . Peripheral neuropathy (HCC)     h/o neg w/u  . ED (erectile dysfunction)   .  Hypertriglyceridemia   . COPD (chronic obstructive pulmonary disease) ([place])     recent dx--no acute problems  . Hiatal hernia 1956    found while in service  . Anxiety   . Atrial fibrillation with RVR ([place])   . Long term (current) use of anticoagulants 02/21/2011  . Serrated adenoma of colon 03/2010  . Renal cyst     bilateral  . Pulmonary fibrosis ([place]) 2015    Rx Esbriet ~ 10-2013 ([name]), Dr. [name]  . On home oxygen therapy     "2L; 20-24h for the past week" (01/26/2015)  . Emphysema lung ([place]) 2016    Dr. [name]  . B12 deficiency anemia   . Idiopathic pulmonary fibrosis ([place]) 2016    Dr. [name]  . Pneumonia ?2015  . CAP (community acquired pneumonia) 01/26/2015  . Osteoarthritis     "maybe in my hands, feet" (01/26/2015)  . Depression   . Squamous cell carcinoma of skin of scalp     tx'd ~ 59yrs; "froze them off"   . Squamous cell carcinoma, face      "froze them off"  . Melanoma of scalp (HCC)    BP 133/79 mmHg  Pulse 82  Resp 14  SpO2 96%  Opioid Risk Score:   Fall Risk Score:  `1  Depression screen PHQ 2/9  Depression screen [facility] 2/9 12/14/2014 01/26/2014 10/24/2013 01/07/2013  Decreased Interest 0 0 0 0  Down, Depressed, Hopeless 0 0 0 0  PHQ - 2 Score 0 0 0 0     Review of Systems  All other systems reviewed and are negative.      Objective:   Physical Exam  Constitutional: He appears well-developed.  thin  HENT:  Head: Normocephalic and atraumatic.  Eyes: Conjunctivae and EOM are normal. Pupils are equal, round, and reactive to light.  Cardiovascular: Normal rate and normal heart sounds.  An irregularly irregular rhythm present.  Pulmonary/Chest: Effort normal and breath sounds normal. No respiratory distress.  Abdominal: Soft. Bowel sounds are normal. There is no tenderness.  Gastrostomy tube percutaneous without erythema and no drainage no tenderness  Nursing note and vitals reviewed.  Motor strength is 5/5 bilateral deltoid, biceps, triceps, grip, hip flexor, knee extensor, ankle dorsi flexor and plantar flexor Sensation cannot assess secondary to aphasia.  Patient is able to name simple objects such as pen and watch but was unable to name oxygen tank Does have fluent spontaneous speech  He has some signs of motor apraxia bilateral upper extremity although this may be influenced in some degree by his receptive aphasia  Ambulates without toe drag or knee instability he does have a widened base of support. He uses a walker.       Assessment & Plan:  1. History of left posterior middle cerebral artery branch infarct causing primarily fluent aphasia, severe Dysphagia  as well as apraxia. He has made good progress with his ADLs. He continues have significant language impairment as well as  Neck swallowing impairment and will need some long-term speech. He already has  speech therapy set up as an outpatient  Continues to have gait imbalance, prior to his stroke he did not use an assistive device and was walking several miles several times per week. We will order outpatient PT given that he still requires an assistive device.  Return to clinic 2 months  Discussed my findings as well as my recommendation with the patient as well as his wife who also is his primary caregiver Over  half of the 25 min visit was spent counseling and coordinating care.

## 2015-03-30 ENCOUNTER — Other Ambulatory Visit: Payer: Self-pay | Admitting: Physical Medicine and Rehabilitation

## 2015-03-30 DIAGNOSIS — J449 Chronic obstructive pulmonary disease, unspecified: Secondary | ICD-10-CM | POA: Diagnosis not present

## 2015-03-30 DIAGNOSIS — R1313 Dysphagia, pharyngeal phase: Secondary | ICD-10-CM | POA: Diagnosis not present

## 2015-03-30 DIAGNOSIS — I69320 Aphasia following cerebral infarction: Secondary | ICD-10-CM | POA: Diagnosis not present

## 2015-03-30 DIAGNOSIS — I69351 Hemiplegia and hemiparesis following cerebral infarction affecting right dominant side: Secondary | ICD-10-CM | POA: Diagnosis not present

## 2015-03-30 DIAGNOSIS — L89312 Pressure ulcer of right buttock, stage 2: Secondary | ICD-10-CM | POA: Diagnosis not present

## 2015-03-30 DIAGNOSIS — F419 Anxiety disorder, unspecified: Secondary | ICD-10-CM | POA: Diagnosis not present

## 2015-04-03 ENCOUNTER — Ambulatory Visit: Payer: Medicare Other | Attending: Neurology

## 2015-04-03 DIAGNOSIS — R531 Weakness: Secondary | ICD-10-CM | POA: Insufficient documentation

## 2015-04-03 DIAGNOSIS — R1310 Dysphagia, unspecified: Secondary | ICD-10-CM | POA: Diagnosis not present

## 2015-04-03 DIAGNOSIS — R2681 Unsteadiness on feet: Secondary | ICD-10-CM | POA: Insufficient documentation

## 2015-04-03 DIAGNOSIS — I69320 Aphasia following cerebral infarction: Secondary | ICD-10-CM | POA: Diagnosis not present

## 2015-04-03 DIAGNOSIS — R269 Unspecified abnormalities of gait and mobility: Secondary | ICD-10-CM | POA: Diagnosis not present

## 2015-04-03 NOTE — Therapy (Signed)
[facility] [address] [place], [place], [address] Phone: [phone]   Fax:  [phone]  Speech Language Pathology Evaluation  Patient Details  Name: Brian [name] MRN: [id number] Date of Birth: 10-Sep-1928 Referring Provider: Rosalin Hawking M.D.  Encounter Date: 04/03/2015      End of Session - 04/03/15 1130    Visit Number 1   Number of Visits 25   Date for SLP Re-Evaluation 06/01/15   SLP Start Time 1017   SLP Stop Time  1103   SLP Time Calculation (min) 46 min   Activity Tolerance Patient tolerated treatment well      Past Medical History  Diagnosis Date  . Muscle tremor     saw neurology remotely elsewhere, was Rx inderal  . Crohn's ileocolitis ([place])   . Osteopenia     per DEXA 11/2008  . Hypertension 11/11    mild  . Hemorrhoids   . Hypertrophy of prostate     w/o UR obst & oth luts  . Peripheral neuropathy (HCC)     h/o neg w/u  . ED (erectile dysfunction)   . Hypertriglyceridemia   . COPD (chronic obstructive pulmonary disease) ([place])     recent dx--no acute problems  . Hiatal hernia 1956    found while in service  . Anxiety   . Atrial fibrillation with RVR ([place])   . Long term (current) use of anticoagulants 02/21/2011  . Serrated adenoma of colon 03/2010  . Renal cyst     bilateral  . Pulmonary fibrosis ([place]) 2015    Rx Esbriet ~ 10-2013 ([name]), Dr. [name]  . On home oxygen therapy     "2L; 20-24h for the past week" (01/26/2015)  . Emphysema lung ([place]) 2016    Dr. [name]  . B12 deficiency anemia   . Idiopathic pulmonary fibrosis ([place]) 2016    Dr. [name]  . Pneumonia ?2015  . CAP (community acquired pneumonia) 01/26/2015  . Osteoarthritis     "maybe in my hands, feet" (01/26/2015)  . Depression   . Squamous cell carcinoma of skin of scalp     tx'd ~ 69yrs; "froze them off"  . Squamous cell carcinoma, face      "froze them off"  . Melanoma of scalp [facility])     Past Surgical History   Procedure Laterality Date  . Colon resection  1984    resection of distal ileum and cecum w/ appendectomy, 18-inch small intestine, for Crohn's disease  . Colon surgery    . Appendectomy  1984  . Cataract extraction w/ intraocular lens  implant, bilateral Bilateral 2007  . Cholecystectomy  02/17/2011    Procedure: LAPAROSCOPIC CHOLECYSTECTOMY WITH INTRAOPERATIVE CHOLANGIOGRAM;  Surgeon: Edward Jolly, MD;  Location: [facility];  Service: General;  Laterality: N/A;  . Mohs surgery Right ~ 2014    "side of my scalp"    There were no vitals filed for this visit.  Visit Diagnosis: Dysphagia  Aphasia following cerebral infarction      Subjective Assessment - 04/03/15 1024    Subjective Pt with mod-max hoarse voice.   Patient is accompained by: Family member  Wife and son            SLP Evaluation [facility] - 04/03/15 1024    SLP Visit Information   SLP Received On 04/02/14   Referring Provider Rosalin Hawking M.D.   Onset Date January 31, 2015   Medical Diagnosis CVA   Subjective   Subjective  NPO   Pain Assessment   Pain Score 0-No pain   General Information   HPI Pt has had tx on CIR as well as HHST. Minimal change in swallow function from November to end December.   Prior Functional Status   Cognitive/Linguistic Baseline Within functional limits   Type of Home House    Lives With Spouse   Vocation Retired   Pain Assessment   Pain Assessment No/denies pain   Auditory Comprehension   Overall Auditory Comprehension Impaired   Yes/No Questions Impaired   Commands Impaired   One Step Basic Commands 50-74% accurate  possible motor apraxia complicating pt function   Verbal Expression   Overall Verbal Expression Impaired   Level of Generative/Spontaneous Verbalization Conversation  language of confusion noted today   Oral Motor/Sensory Function   Overall Oral Motor/Sensory Function --  limited assessment today   Labial ROM Within Functional Limits   Facial ROM Within  Functional Limits   Overall Oral Motor/Sensory Function hard to fully assess given auditory comprehension deficits   Motor Speech   Overall Motor Speech Other (comment)  hard to fully assess given auditory comprehension deficits   Phonation Hoarse   Motor Speech Errors --  Pt req'd multiple cues to swallow, with visual and verbal A     SLP educated pt/wife/son re: modified barium swallow test (MBSS) results and how these results will coordinate to pt's therapy course with this SLP. Pt's wife to bring HEP to next session.   SLP explained how NMES for swallowing works, and told pt/son/wife pt would need to come x3/week in order to use NMES.  Pt req'd multiple requests to swallow during today's session, although unknown if pt did not comprehend SLP request (with multi-modal cues), or if oral apraxia complicated pt's performance. See above for other attempted/completed eval tasks. Pt will need to practice at home 6 of 7 days per week and arrive to therapy here x3/week to afford him the best opportunity to regain safe swallowing. Family is aware of this, and SLP explained to pt verbally using different words each time, as well as with visual (gesture and written) cues. Pt appeared to acknowledge understanding.   Wife stated pt's oral care had been completed today just prior to evaluation. After evaluation tasks, SLP placed electrodes and utilized placement 3a to begin treatment by initializing pt's sensation to electrodes, and to explain to pt with electrodes placed what will be expected of him during sessions. Pt completed swallows 25% of the time after SLP first request with ice chips, and like during eval tasks, req'd consistent mod-max cues from SLP to elicit swallows.                    SLP Education - 04/03/15 1121    Education provided Yes   Education Details results of modified (MBSS), swallow function ID'd as 80-100% impaired, how NMES for swallowing works, cont HEP as directed  by [employer]) Educated Patient;Spouse;Child(ren)   Methods Explanation;Demonstration;Verbal cues;Tactile cues   Comprehension Verbalized understanding;Verbal cues required;Need further instruction;Tactile cues required          SLP Short Term Goals - 04/03/15 1136    SLP SHORT TERM GOAL #1   Title pt will demo efforftul swallows 50% of the time in >100 opportunities over 3 sessions   Time 4   Period Weeks   Status New   SLP SHORT TERM GOAL #2   Title pt will throat clear/cough with hydrophonic voice  with rare nonverbal cues   Time 4   Period Weeks   Status New   SLP SHORT TERM GOAL #3   Title pt will complete HEP for dysphagia with modified independence (demo cues)   Time 4   Period Weeks   Status New          SLP Long Term Goals - 05/02/15 1139    SLP LONG TERM GOAL #1   Title pt will demo effortful swallows 60% of the time in >120 opportunities over 3 sessions   Time 8   Period Weeks   Status New   SLP LONG TERM GOAL #2   Title pt will complete dysphagia HEP with modified independence over 3 sessions   Time 8   Period Weeks   Status New   SLP LONG TERM GOAL #3   Title pt will clear voice/cough with hydrophonic voice independently   Time 8   Period Weeks   Status New   SLP LONG TERM GOAL #4   Title pt/wife will indicate 3 s/s aspiration PNA   Time 8   Period Weeks   Status New          Plan - 05-02-2015 1130    Clinical Impression Statement Pt presents with severe dysphagia per modified barium swallow exam 03-20-15. Has remained NPO since 02-01-15, with intensive HHST for 4-6 weeks. Pt had difficulty swallowing on command today, believed partly due to some degree of oral apraxia. Pt would benefit from skilled ST to cont to maximize swallow function as this is most concerning to pt/family at this time.   Speech Therapy Frequency 3x / week   Duration --  8 weeks   Treatment/Interventions Aspiration precaution training;Pharyngeal strengthening  exercises;Diet toleration management by SLP;NMES;Cueing hierarchy;Functional tasks;Patient/family education;Compensatory strategies;SLP instruction and feedback;Language facilitation   Potential to Achieve Goals Fair   Potential Considerations Severity of impairments   Consulted and Agree with Plan of Care Family member/caregiver  wife - healthcare POA          G-Codes - May 02, 2015 1125    Functional Assessment Tool Used modfied barium swallow exam report   Functional Limitations Swallowing   Swallow Current Status [id number]) At least 80 percent but less than 100 percent impaired, limited or restricted   Swallow Goal Status [id number]) At least 60 percent but less than 80 percent impaired, limited or restricted      Problem List Patient Active Problem List   Diagnosis Date Noted  . Gait disturbance, post-stroke 03/29/2015  . Atrial fibrillation with RVR ([place]) 03/13/2015  . Dysphagia   . Left middle cerebral artery stroke ([place]) 02/02/2015  . Dysphagia, pharyngoesophageal phase 02/02/2015  . Aphasia due to recent cerebrovascular accident 02/02/2015  . Bacterial pneumonia   . Stroke with cerebral ischemia ([place])   . Stroke ([place]) 01/31/2015  . CVA (cerebral infarction) 01/31/2015  . Aphasia   . Hypokalemia   . CAP (community acquired pneumonia) 01/26/2015  . Pulmonary fibrosis ([place]) 01/26/2015  . PCP NOTES >>>>>>>>>>>> 12/14/2014  . Vitamin D deficiency 11/04/2013  . Vasomotor rhinitis 09/22/2013  . Diarrhea 07/08/2013  . Crohn's disease ([place]) 07/08/2013  . Postinflammatory pulmonary fibrosis ([place]) 05/26/2013  . Anxiety state 05/20/2013  . Sprain of shoulder, left 06/01/2012  . Personal history of colonic polyps 04/12/2012  . Pulmonary nodules 03/02/2011  . Cough 03/02/2011  . Long term (current) use of anticoagulants 02/21/2011  . Atrial fibrillation with controlled ventricular response ([place]) 02/17/2011  . Annual physical exam 12/23/2010  .  Dyspnea 09/03/2010  .  HEMORRHOIDS-INTERNAL 03/08/2010  . [place] INTESTINE 03/08/2010  . RECTAL BLEEDING 03/08/2010  . HEMATOCHEZIA 03/06/2010  . Essential hypertension 02/04/2010  . HYPERTROPHY PROSTATE W/O UR OBST & OTH LUTS 12/10/2009  . Osteopenia 12/07/2009  . PERIPHERAL NEUROPATHY 08/29/2009  . FECAL INCONTINENCE 01/29/2009  . OSTEOARTHRITIS, ANKLE, RIGHT 11/01/2008  . ERECTILE DYSFUNCTION 12/02/2007  . TREMOR 12/02/2007  . CARCINOMA, SKIN, SQUAMOUS CELL 09/16/2006  . B12 DEFICIENCY 08/27/2006  . HYPERTRIGLYCERIDEMIA 08/27/2006    [place] , [place], [place] 04/03/2015, 11:43 AM  [facility] [address] [place], [place], [address] Phone: [phone]   Fax:  [phone]  Name: KOBEN ROBIES MRN: [id number] Date of Birth: 09/05/1928

## 2015-04-03 NOTE — Patient Instructions (Signed)
Continue to complete exercises given by [facility], with the frequency and number of repetitions as she has directed.

## 2015-04-04 ENCOUNTER — Ambulatory Visit: Payer: Medicare Other

## 2015-04-04 DIAGNOSIS — R269 Unspecified abnormalities of gait and mobility: Secondary | ICD-10-CM | POA: Diagnosis not present

## 2015-04-04 DIAGNOSIS — R2681 Unsteadiness on feet: Secondary | ICD-10-CM | POA: Diagnosis not present

## 2015-04-04 DIAGNOSIS — R531 Weakness: Secondary | ICD-10-CM | POA: Diagnosis not present

## 2015-04-04 DIAGNOSIS — R1310 Dysphagia, unspecified: Secondary | ICD-10-CM | POA: Diagnosis not present

## 2015-04-04 DIAGNOSIS — I69320 Aphasia following cerebral infarction: Secondary | ICD-10-CM

## 2015-04-04 NOTE — Therapy (Signed)
[facility] [address] [place], [place], [address] Phone: [phone]   Fax:  [phone]  Speech Language Pathology Treatment  Patient Details  Name: Brian [name] MRN: [id number] Date of Birth: Dec 18, 1928 Referring Provider: Rosalin Hawking M.D.  Encounter Date: 04/04/2015      End of Session - 04/04/15 1432    Visit Number 2   Number of Visits 25   Date for SLP Re-Evaluation 06/01/15   SLP Start Time [id number]   SLP Stop Time  1145   SLP Time Calculation (min) 43 min   Activity Tolerance Patient tolerated treatment well      Past Medical History  Diagnosis Date  . Muscle tremor     saw neurology remotely elsewhere, was Rx inderal  . Crohn's ileocolitis ([place])   . Osteopenia     per DEXA 11/2008  . Hypertension 11/11    mild  . Hemorrhoids   . Hypertrophy of prostate     w/o UR obst & oth luts  . Peripheral neuropathy (HCC)     h/o neg w/u  . ED (erectile dysfunction)   . Hypertriglyceridemia   . COPD (chronic obstructive pulmonary disease) ([place])     recent dx--no acute problems  . Hiatal hernia 1956    found while in service  . Anxiety   . Atrial fibrillation with RVR ([place])   . Long term (current) use of anticoagulants 02/21/2011  . Serrated adenoma of colon 03/2010  . Renal cyst     bilateral  . Pulmonary fibrosis ([place]) 2015    Rx Esbriet ~ 10-2013 ([name]), Dr. [name]  . On home oxygen therapy     "2L; 20-24h for the past week" (01/26/2015)  . Emphysema lung ([place]) 2016    Dr. [name]  . B12 deficiency anemia   . Idiopathic pulmonary fibrosis ([place]) 2016    Dr. [name]  . Pneumonia ?2015  . CAP (community acquired pneumonia) 01/26/2015  . Osteoarthritis     "maybe in my hands, feet" (01/26/2015)  . Depression   . Squamous cell carcinoma of skin of scalp     tx'd ~ 38yrs; "froze them off"  . Squamous cell carcinoma, face      "froze them off"  . Melanoma of scalp [facility])     Past Surgical History   Procedure Laterality Date  . Colon resection  1984    resection of distal ileum and cecum w/ appendectomy, 18-inch small intestine, for Crohn's disease  . Colon surgery    . Appendectomy  1984  . Cataract extraction w/ intraocular lens  implant, bilateral Bilateral 2007  . Cholecystectomy  02/17/2011    Procedure: LAPAROSCOPIC CHOLECYSTECTOMY WITH INTRAOPERATIVE CHOLANGIOGRAM;  Surgeon: Edward Jolly, MD;  Location: [facility];  Service: General;  Laterality: N/A;  . Mohs surgery Right ~ 2014    "side of my scalp"    There were no vitals filed for this visit.  Visit Diagnosis: Dysphagia  Aphasia following cerebral infarction      Subjective Assessment - 04/03/15 1024    Subjective Pt with mod-max hoarse voice.   Patient is accompained by: Family member  Wife and son           SLP Evaluation [facility] - 04/03/15 1024    SLP Visit Information   SLP Received On 04/02/14   Referring Provider Rosalin Hawking M.D.   Onset Date January 31, 2015   Medical Diagnosis CVA   Subjective   Subjective NPO  Pain Assessment   Pain Score 0-No pain   General Information   HPI Pt has had tx on CIR as well as HHST. Minimal change in swallow function from November to end December.   Prior Functional Status   Cognitive/Linguistic Baseline Within functional limits   Type of Home House    Lives With Spouse   Vocation Retired   Pain Assessment   Pain Assessment No/denies pain   Auditory Comprehension   Overall Auditory Comprehension Impaired   Yes/No Questions Impaired   Commands Impaired   One Step Basic Commands 50-74% accurate  possible motor apraxia complicating pt function   Verbal Expression   Overall Verbal Expression Impaired   Level of Generative/Spontaneous Verbalization Conversation  language of confusion noted today   Oral Motor/Sensory Function   Overall Oral Motor/Sensory Function --  limited assessment today   Labial ROM Within Functional Limits   Facial ROM Within  Functional Limits   Overall Oral Motor/Sensory Function hard to fully assess given auditory comprehension deficits   Motor Speech   Overall Motor Speech Other (comment)  hard to fully assess given auditory comprehension deficits   Phonation Hoarse   Motor Speech Errors --  Pt req'd multiple cues to swallow, with visual and verbal A            ADULT SLP TREATMENT - 04/04/15 1145    General Information   Behavior/Cognition Alert;Cooperative;Pleasant mood;Requires cueing   Treatment Provided   Treatment provided Dysphagia   Dysphagia Treatment   Temperature Spikes Noted No   Respiratory Status Nasal cannula  small amount blood   Treatment Methods Skilled observation;Patient/caregiver education   Patient observed directly with PO's Yes   Type of PO's observed Ice chips   Pharyngeal Phase Signs & Symptoms Wet vocal quality  consistently after 1st swallow, cleared after 2nd swallow   Type of cueing Verbal;Visual   Amount of cueing Minimal   Other treatment/comments SLP worked with pt with ice chips after oral care, and placed pt with NMES placement 3a. SLP usual cues for 2 swallows needed initially, then 3 swallows per chip initially, but pt acclimated to routine easily with repetition. Educated wife/son about hydrophonic voice (see "education" section)   Pain Assessment   Pain Assessment No/denies pain   Assessment / Recommendations / Plan   Plan Continue with current plan of care   Dysphagia Recommendations   Diet recommendations NPO  ice chips following oral care   Progression Toward Goals   Progression toward goals Progressing toward goals          SLP Education - 04/04/15 1150    Education provided Yes   Education Details hydrophonic voice, lingual pumping sign of fatigue, thyroid elevation to ID complete swallow   Person(s) Educated Patient;Spouse;Child(ren)   Methods Explanation;Verbal cues   Comprehension Verbalized understanding          SLP Short Term  Goals - 04/04/15 1434    SLP SHORT TERM GOAL #1   Title pt will demo efforftul swallows 60% of the time in >100 opportunities over 3 sessions   Time 4   Period Weeks   Status Revised   SLP SHORT TERM GOAL #2   Title pt will throat clear/cough with hydrophonic voice with rare nonverbal cues   Time 4   Period Weeks   Status On-going   SLP SHORT TERM GOAL #3   Title pt will complete HEP for dysphagia with modified independence (demo cues)   Time 4  Period Weeks   Status On-going          SLP Long Term Goals - 04/04/15 1434    SLP LONG TERM GOAL #1   Title pt will demo effortful swallows 75% of the time in >120 opportunities over 3 sessions   Time 8   Period Weeks   Status Revised   SLP LONG TERM GOAL #2   Title pt will complete dysphagia HEP with modified independence over 3 sessions   Time 8   Period Weeks   Status On-going   SLP LONG TERM GOAL #3   Title pt will clear voice/cough with hydrophonic voice independently   Time 8   Period Weeks   Status On-going   SLP LONG TERM GOAL #4   Title pt/wife will indicate 3 s/s aspiration PNA   Time 8   Period Weeks   Status On-going          Plan - 04/04/15 1432    Clinical Impression Statement Pt had less difficulty swallowing on command today, believed partly due to repetition of activity and possibly incr'd understanding of SLP desire for him to swallow Pt would cont to benefit from skilled ST to cont to maximize swallow function as this is most concerning to pt/family at this time.   Speech Therapy Frequency 3x / week   Duration --  8 weeks   Treatment/Interventions Aspiration precaution training;Pharyngeal strengthening exercises;Diet toleration management by SLP;NMES;Cueing hierarchy;Functional tasks;Patient/family education;Compensatory strategies;SLP instruction and feedback;Language facilitation   Potential to Achieve Goals Fair   Potential Considerations Severity of impairments   Consulted and Agree with Plan of  Care Family member/caregiver  wife - healthcare POA          G-Codes - April 26, 2015 1125    Functional Assessment Tool Used modfied barium swallow exam report   Functional Limitations Swallowing   Swallow Current Status [id number]) At least 80 percent but less than 100 percent impaired, limited or restricted   Swallow Goal Status [id number]) At least 60 percent but less than 80 percent impaired, limited or restricted      Problem List Patient Active Problem List   Diagnosis Date Noted  . Gait disturbance, post-stroke 03/29/2015  . Atrial fibrillation with RVR ([place]) 03/13/2015  . Dysphagia   . Left middle cerebral artery stroke ([place]) 02/02/2015  . Dysphagia, pharyngoesophageal phase 02/02/2015  . Aphasia due to recent cerebrovascular accident 02/02/2015  . Bacterial pneumonia   . Stroke with cerebral ischemia ([place])   . Stroke ([place]) 01/31/2015  . CVA (cerebral infarction) 01/31/2015  . Aphasia   . Hypokalemia   . CAP (community acquired pneumonia) 01/26/2015  . Pulmonary fibrosis ([place]) 01/26/2015  . PCP NOTES >>>>>>>>>>>> 12/14/2014  . Vitamin D deficiency 11/04/2013  . Vasomotor rhinitis 09/22/2013  . Diarrhea 07/08/2013  . Crohn's disease ([place]) 07/08/2013  . Postinflammatory pulmonary fibrosis ([place]) 05/26/2013  . Anxiety state 05/20/2013  . Sprain of shoulder, left 06/01/2012  . Personal history of colonic polyps 04/12/2012  . Pulmonary nodules 03/02/2011  . Cough 03/02/2011  . Long term (current) use of anticoagulants 02/21/2011  . Atrial fibrillation with controlled ventricular response ([place]) 02/17/2011  . Annual physical exam 12/23/2010  . Dyspnea 09/03/2010  . HEMORRHOIDS-INTERNAL 03/08/2010  . [place] INTESTINE 03/08/2010  . RECTAL BLEEDING 03/08/2010  . HEMATOCHEZIA 03/06/2010  . Essential hypertension 02/04/2010  . HYPERTROPHY PROSTATE W/O UR OBST & OTH LUTS 12/10/2009  . Osteopenia 12/07/2009  . PERIPHERAL NEUROPATHY 08/29/2009  . FECAL  INCONTINENCE 01/29/2009  . OSTEOARTHRITIS, ANKLE, RIGHT 11/01/2008  . ERECTILE DYSFUNCTION 12/02/2007  . TREMOR 12/02/2007  . CARCINOMA, SKIN, SQUAMOUS CELL 09/16/2006  . B12 DEFICIENCY 08/27/2006  . HYPERTRIGLYCERIDEMIA 08/27/2006    [place] , Colbert, Bartlett  04/04/2015, 2:35 PM  [facility] [address] [place], [place], [address] Phone: [phone]   Fax:  [phone]   Name: EXAVIOR TEEM MRN: [id number] Date of Birth: 17-Nov-1928

## 2015-04-10 ENCOUNTER — Ambulatory Visit: Payer: Medicare Other

## 2015-04-10 DIAGNOSIS — R1310 Dysphagia, unspecified: Secondary | ICD-10-CM

## 2015-04-10 DIAGNOSIS — R269 Unspecified abnormalities of gait and mobility: Secondary | ICD-10-CM | POA: Diagnosis not present

## 2015-04-10 DIAGNOSIS — I69320 Aphasia following cerebral infarction: Secondary | ICD-10-CM | POA: Diagnosis not present

## 2015-04-10 DIAGNOSIS — R531 Weakness: Secondary | ICD-10-CM | POA: Diagnosis not present

## 2015-04-10 DIAGNOSIS — R2681 Unsteadiness on feet: Secondary | ICD-10-CM | POA: Diagnosis not present

## 2015-04-11 ENCOUNTER — Ambulatory Visit: Payer: Medicare Other

## 2015-04-11 DIAGNOSIS — I69320 Aphasia following cerebral infarction: Secondary | ICD-10-CM | POA: Diagnosis not present

## 2015-04-11 DIAGNOSIS — R2681 Unsteadiness on feet: Secondary | ICD-10-CM | POA: Diagnosis not present

## 2015-04-11 DIAGNOSIS — R531 Weakness: Secondary | ICD-10-CM | POA: Diagnosis not present

## 2015-04-11 DIAGNOSIS — R1310 Dysphagia, unspecified: Secondary | ICD-10-CM | POA: Diagnosis not present

## 2015-04-11 DIAGNOSIS — R269 Unspecified abnormalities of gait and mobility: Secondary | ICD-10-CM | POA: Diagnosis not present

## 2015-04-11 NOTE — Therapy (Signed)
[facility] [address] [place], [place], [address] Phone: [phone]   Fax:  [phone]  Speech Language Pathology Treatment  Patient Details  Name: Brian [name] MRN: [id number] Date of Birth: [birth year] Referring Provider: Rosalin Hawking M.D.  Encounter Date: 04/10/2015      End of Session - 04/11/15 1147    Visit Number 3   Number of Visits 25   Date for SLP Re-Evaluation 06/01/15   SLP Start Time 81   SLP Stop Time  1145   SLP Time Calculation (min) 43 min   Activity Tolerance Patient tolerated treatment well      Past Medical History  Diagnosis Date  . Muscle tremor     saw neurology remotely elsewhere, was Rx inderal  . Crohn's ileocolitis ([name])   . Osteopenia     per DEXA 11/2008  . Hypertension 11/11    mild  . Hemorrhoids   . Hypertrophy of prostate     w/o UR obst & oth luts  . Peripheral neuropathy (HCC)     h/o neg w/u  . ED (erectile dysfunction)   . Hypertriglyceridemia   . COPD (chronic obstructive pulmonary disease) ([place])     recent dx--no acute problems  . Hiatal hernia 1956    found while in service  . Anxiety   . Atrial fibrillation with RVR ([name])   . Long term (current) use of anticoagulants 02/21/2011  . Serrated adenoma of colon 03/2010  . Renal cyst     bilateral  . Pulmonary fibrosis ([place]) 2015    Rx Esbriet ~ 10-2013 ([name]), Dr. [name]  . On home oxygen therapy     "2L; 20-24h for the past week" (01/26/2015)  . Emphysema lung ([place]) 2016    Dr. [name]  . B12 deficiency anemia   . Idiopathic pulmonary fibrosis ([name]) 2016    Dr. [name]  . Pneumonia ?2015  . CAP (community acquired pneumonia) 01/26/2015  . Osteoarthritis     "maybe in my hands, feet" (01/26/2015)  . Depression   . Squamous cell carcinoma of skin of scalp     tx'd ~ 89yrs; "froze them off"  . Squamous cell carcinoma, face      "froze them off"  . Melanoma of scalp [name])     Past Surgical History   Procedure Laterality Date  . Colon resection  1984    resection of distal ileum and cecum w/ appendectomy, 18-inch small intestine, for Crohn's disease  . Colon surgery    . Appendectomy  1984  . Cataract extraction w/ intraocular lens  implant, bilateral Bilateral 2007  . Cholecystectomy  02/17/2011    Procedure: LAPAROSCOPIC CHOLECYSTECTOMY WITH INTRAOPERATIVE CHOLANGIOGRAM;  Surgeon: Edward Jolly, MD;  Location: [facility];  Service: General;  Laterality: N/A;  . Mohs surgery Right ~ 2014    "side of my scalp"    There were no vitals filed for this visit.  Visit Diagnosis: No diagnosis found.      Subjective Assessment - 04/11/15 1118    Subjective Pt's voice quality improved over last week.   Patient is accompained by: Family member  son, wife               ADULT SLP TREATMENT - 04/11/15 1121    General Information   Behavior/Cognition Alert;Cooperative;Pleasant mood;Requires cueing   Treatment Provided   Treatment provided Dysphagia   Dysphagia Treatment   Respiratory Status Nasal cannula   Treatment Methods Skilled observation;Therapeutic  exercise   Patient observed directly with PO's Yes   Type of PO's observed Ice chips   Other treatment/comments NMES used placement 3b with max mA 10.0 with ice chips. Pt with incr'd tongue pumping after 8 minutes of 3-4 swallows per ice chip. Pt with more notable fatigue after 16 minutes. SLP engaged pt in conversation for a break. Pt more intelligible this week than last = able to generate more meaningful speech, fluently.    Pain Assessment   Pain Assessment No/denies pain   Assessment / Recommendations / Plan   Plan Continue with current plan of care   Dysphagia Recommendations   Diet recommendations NPO  with ice chips <30 minutes after oral care   Progression Toward Goals   Progression toward goals Progressing toward goals          SLP Education - 04/11/15 1147    Education provided No   Education Details  -   Forensic psychologist) Educated -   Methods -   Comprehension -          SLP Short Term Goals - 04/11/15 1149    SLP SHORT TERM GOAL #1   Title pt will demo efforftul swallows 60% of the time in >100 opportunities over 3 sessions   Time 3   Period Weeks   Status Revised   SLP SHORT TERM GOAL #2   Title pt will throat clear/cough with hydrophonic voice with rare nonverbal cues   Time 3   Period Weeks   Status On-going   SLP SHORT TERM GOAL #3   Title pt will complete HEP for dysphagia with modified independence (demo cues)   Time 3   Period Weeks   Status On-going          SLP Long Term Goals - 04/11/15 1149    SLP LONG TERM GOAL #1   Title pt will demo effortful swallows 75% of the time in >120 opportunities over 3 sessions   Time 7   Period Weeks   Status Revised   SLP LONG TERM GOAL #2   Title pt will complete dysphagia HEP with modified independence over 3 sessions   Time 7   Period Weeks   Status On-going   SLP LONG TERM GOAL #3   Title pt will clear voice/cough with hydrophonic voice independently   Time 7   Period Weeks   Status On-going   SLP LONG TERM GOAL #4   Title pt/wife will indicate 3 s/s aspiration PNA   Time 7   Period Weeks   Status On-going          Plan - 04/11/15 1148    Clinical Impression Statement Pt had very little difficulty swallowing on command today, He was much more verbally appropriate today during therapy, both receptively and expressively, concurrent with family report. Pt would cont to benefit from skilled ST to cont to maximize swallow function as this is most concerning to pt/family at this time.   Duration --  7 weeks   Treatment/Interventions Aspiration precaution training;Pharyngeal strengthening exercises;Diet toleration management by SLP;NMES;Cueing hierarchy;Functional tasks;Patient/family education;Compensatory strategies;SLP instruction and feedback;Language facilitation   Potential to Achieve Goals Fair   Potential  Considerations Severity of impairments   Consulted and Agree with Plan of Care Family member/caregiver  wife - healthcare POA        Problem List Patient Active Problem List   Diagnosis Date Noted  . Gait disturbance, post-stroke 03/29/2015  . Atrial fibrillation with RVR ([name]) 03/13/2015  .  Dysphagia   . Left middle cerebral artery stroke ([place]) 02/02/2015  . Dysphagia, pharyngoesophageal phase 02/02/2015  . Aphasia due to recent cerebrovascular accident 02/02/2015  . Bacterial pneumonia   . Stroke with cerebral ischemia ([place])   . Stroke ([place]) 01/31/2015  . CVA (cerebral infarction) 01/31/2015  . Aphasia   . Hypokalemia   . CAP (community acquired pneumonia) 01/26/2015  . Pulmonary fibrosis ([place]) 01/26/2015  . PCP NOTES >>>>>>>>>>>> 12/14/2014  . Vitamin D deficiency 11/04/2013  . Vasomotor rhinitis 09/22/2013  . Diarrhea 07/08/2013  . Crohn's disease ([place]) 07/08/2013  . Postinflammatory pulmonary fibrosis ([place]) 05/26/2013  . Anxiety state 05/20/2013  . Sprain of shoulder, left 06/01/2012  . Personal history of colonic polyps 04/12/2012  . Pulmonary nodules 03/02/2011  . Cough 03/02/2011  . Long term (current) use of anticoagulants 02/21/2011  . Atrial fibrillation with controlled ventricular response ([place]) 02/17/2011  . Annual physical exam 12/23/2010  . Dyspnea 09/03/2010  . HEMORRHOIDS-INTERNAL 03/08/2010  . [place] INTESTINE 03/08/2010  . RECTAL BLEEDING 03/08/2010  . HEMATOCHEZIA 03/06/2010  . Essential hypertension 02/04/2010  . HYPERTROPHY PROSTATE W/O UR OBST & OTH LUTS 12/10/2009  . Osteopenia 12/07/2009  . PERIPHERAL NEUROPATHY 08/29/2009  . FECAL INCONTINENCE 01/29/2009  . OSTEOARTHRITIS, ANKLE, RIGHT 11/01/2008  . ERECTILE DYSFUNCTION 12/02/2007  . TREMOR 12/02/2007  . CARCINOMA, SKIN, SQUAMOUS CELL 09/16/2006  . B12 DEFICIENCY 08/27/2006  . HYPERTRIGLYCERIDEMIA 08/27/2006    [place] , Erlanger, Dolores  04/11/2015, 5:02  PM  [facility] [address] [place], [place], [address] Phone: [phone]   Fax:  [phone]   Name: GALILEO SIECK MRN: [id number] Date of Birth: 05/04/1928

## 2015-04-11 NOTE — Therapy (Signed)
[facility] [address] [place], [place], [address] Phone: [phone]   Fax:  [phone]  Speech Language Pathology Treatment  Patient Details  Name: Brian [name] MRN: [id number] Date of Birth: [birth year] Referring Provider: Rosalin Hawking M.D.  Encounter Date: 04/11/2015      End of Session - 04/11/15 1147    Visit Number 3   Number of Visits 25   Date for SLP Re-Evaluation 06/01/15   SLP Start Time 20   SLP Stop Time  1145   SLP Time Calculation (min) 43 min   Activity Tolerance Patient tolerated treatment well      Past Medical History  Diagnosis Date  . Muscle tremor     saw neurology remotely elsewhere, was Rx inderal  . Crohn's ileocolitis ([place])   . Osteopenia     per DEXA 11/2008  . Hypertension 11/11    mild  . Hemorrhoids   . Hypertrophy of prostate     w/o UR obst & oth luts  . Peripheral neuropathy (HCC)     h/o neg w/u  . ED (erectile dysfunction)   . Hypertriglyceridemia   . COPD (chronic obstructive pulmonary disease) ([place])     recent dx--no acute problems  . Hiatal hernia 1956    found while in service  . Anxiety   . Atrial fibrillation with RVR ([place])   . Long term (current) use of anticoagulants 02/21/2011  . Serrated adenoma of colon 03/2010  . Renal cyst     bilateral  . Pulmonary fibrosis ([place]) 2015    Rx Esbriet ~ 10-2013 ([name]), Dr. [name]  . On home oxygen therapy     "2L; 20-24h for the past week" (01/26/2015)  . Emphysema lung ([place]) 2016    Dr. [name]  . B12 deficiency anemia   . Idiopathic pulmonary fibrosis ([place]) 2016    Dr. [name]  . Pneumonia ?2015  . CAP (community acquired pneumonia) 01/26/2015  . Osteoarthritis     "maybe in my hands, feet" (01/26/2015)  . Depression   . Squamous cell carcinoma of skin of scalp     tx'd ~ 57yrs; "froze them off"  . Squamous cell carcinoma, face      "froze them off"  . Melanoma of scalp [facility])     Past Surgical History   Procedure Laterality Date  . Colon resection  1984    resection of distal ileum and cecum w/ appendectomy, 18-inch small intestine, for Crohn's disease  . Colon surgery    . Appendectomy  1984  . Cataract extraction w/ intraocular lens  implant, bilateral Bilateral 2007  . Cholecystectomy  02/17/2011    Procedure: LAPAROSCOPIC CHOLECYSTECTOMY WITH INTRAOPERATIVE CHOLANGIOGRAM;  Surgeon: Edward Jolly, MD;  Location: [facility];  Service: General;  Laterality: N/A;  . Mohs surgery Right ~ 2014    "side of my scalp"    There were no vitals filed for this visit.  Visit Diagnosis: Dysphagia  Aphasia following cerebral infarction      Subjective Assessment - 04/11/15 1118    Subjective Pt's voice quality improved over last week.   Patient is accompained by: Family member  son, wife               ADULT SLP TREATMENT - 04/11/15 1121    General Information   Behavior/Cognition Alert;Cooperative;Pleasant mood;Requires cueing   Treatment Provided   Treatment provided Dysphagia   Dysphagia Treatment   Respiratory Status Nasal cannula   Treatment  Methods Skilled observation;Therapeutic exercise   Patient observed directly with PO's Yes   Type of PO's observed Ice chips   Other treatment/comments NMES used today to strengthen pharyngeal and oral musculature to increase safety with POs and decr tube dependency. Placement 3a with mA no >10.0 . Voice clear after 12 minutes of three hard swallows per ice chip bolus. After 14 minutes, wet voice heard after initial swallow rarely, but cleared after second swallow consistently. After 21 minutes wet voice heard occasionally after initial swallow, cleared spontaneously after second swallow. No overt s/s aspiration PNA today, and none reported. Oral care was provided to patient <30 minutes prior to appointment today. SLP attempted [facility] with pt with limited success (2/9 attempts), and pitch raise without success (0/5 attempts).    Pain  Assessment   Pain Assessment No/denies pain   Assessment / Recommendations / Plan   Plan Continue with current plan of care   Dysphagia Recommendations   Diet recommendations NPO  with ice chips <30 minutes after oral care   Progression Toward Goals   Progression toward goals Progressing toward goals          SLP Education - 04/11/15 1147    Education provided Yes   Education Details Masako swallow   Person(s) Educated Patient;Spouse;Child(ren)   Methods Explanation   Comprehension Verbalized understanding          SLP Short Term Goals - 04/11/15 1149    SLP SHORT TERM GOAL #1   Title pt will demo efforftul swallows 60% of the time in >100 opportunities over 3 sessions   Time 3   Period Weeks   Status Revised   SLP SHORT TERM GOAL #2   Title pt will throat clear/cough with hydrophonic voice with rare nonverbal cues   Time 3   Period Weeks   Status On-going   SLP SHORT TERM GOAL #3   Title pt will complete HEP for dysphagia with modified independence (demo cues)   Time 3   Period Weeks   Status On-going          SLP Long Term Goals - 04/11/15 1149    SLP LONG TERM GOAL #1   Title pt will demo effortful swallows 75% of the time in >120 opportunities over 3 sessions   Time 7   Period Weeks   Status Revised   SLP LONG TERM GOAL #2   Title pt will complete dysphagia HEP with modified independence over 3 sessions   Time 7   Period Weeks   Status On-going   SLP LONG TERM GOAL #3   Title pt will clear voice/cough with hydrophonic voice independently   Time 7   Period Weeks   Status On-going   SLP LONG TERM GOAL #4   Title pt/wife will indicate 3 s/s aspiration PNA   Time 7   Period Weeks   Status On-going          Plan - 04/11/15 1148    Clinical Impression Statement Pt had no difficulty swallowing on command today, except with Masako, which was nonfunctoinal today most lilkely due to oral-nonverbal apraxia.  Pt would cont to benefit from skilled ST to  cont to maximize swallow function as this is most concerning to pt/family at this time.   Duration --  7 weeks   Treatment/Interventions Aspiration precaution training;Pharyngeal strengthening exercises;Diet toleration management by SLP;NMES;Cueing hierarchy;Functional tasks;Patient/family education;Compensatory strategies;SLP instruction and feedback;Language facilitation   Potential to Achieve Goals Fair   Potential Considerations  Severity of impairments   Consulted and Agree with Plan of Care Family member/caregiver  wife - healthcare POA        Problem List Patient Active Problem List   Diagnosis Date Noted  . Gait disturbance, post-stroke 03/29/2015  . Atrial fibrillation with RVR ([place]) 03/13/2015  . Dysphagia   . Left middle cerebral artery stroke ([place]) 02/02/2015  . Dysphagia, pharyngoesophageal phase 02/02/2015  . Aphasia due to recent cerebrovascular accident 02/02/2015  . Bacterial pneumonia   . Stroke with cerebral ischemia ([place])   . Stroke ([place]) 01/31/2015  . CVA (cerebral infarction) 01/31/2015  . Aphasia   . Hypokalemia   . CAP (community acquired pneumonia) 01/26/2015  . Pulmonary fibrosis ([place]) 01/26/2015  . PCP NOTES >>>>>>>>>>>> 12/14/2014  . Vitamin D deficiency 11/04/2013  . Vasomotor rhinitis 09/22/2013  . Diarrhea 07/08/2013  . Crohn's disease ([place]) 07/08/2013  . Postinflammatory pulmonary fibrosis ([place]) 05/26/2013  . Anxiety state 05/20/2013  . Sprain of shoulder, left 06/01/2012  . Personal history of colonic polyps 04/12/2012  . Pulmonary nodules 03/02/2011  . Cough 03/02/2011  . Long term (current) use of anticoagulants 02/21/2011  . Atrial fibrillation with controlled ventricular response ([place]) 02/17/2011  . Annual physical exam 12/23/2010  . Dyspnea 09/03/2010  . HEMORRHOIDS-INTERNAL 03/08/2010  . [place] INTESTINE 03/08/2010  . RECTAL BLEEDING 03/08/2010  . HEMATOCHEZIA 03/06/2010  . Essential hypertension 02/04/2010   . HYPERTROPHY PROSTATE W/O UR OBST & OTH LUTS 12/10/2009  . Osteopenia 12/07/2009  . PERIPHERAL NEUROPATHY 08/29/2009  . FECAL INCONTINENCE 01/29/2009  . OSTEOARTHRITIS, ANKLE, RIGHT 11/01/2008  . ERECTILE DYSFUNCTION 12/02/2007  . TREMOR 12/02/2007  . CARCINOMA, SKIN, SQUAMOUS CELL 09/16/2006  . B12 DEFICIENCY 08/27/2006  . HYPERTRIGLYCERIDEMIA 08/27/2006    [place] , [place], [place] 04/11/2015, 11:50 AM  [facility] [address] [place], [place], [address] Phone: [phone]   Fax:  [phone]   Name: Brian [name] MRN: [id number] Date of Birth: 1928-05-29

## 2015-04-11 NOTE — Patient Instructions (Signed)
Try the tongue bite swallow like we did today, at home intermittently.

## 2015-04-12 ENCOUNTER — Ambulatory Visit: Payer: Medicare Other

## 2015-04-12 DIAGNOSIS — R531 Weakness: Secondary | ICD-10-CM | POA: Diagnosis not present

## 2015-04-12 DIAGNOSIS — R1310 Dysphagia, unspecified: Secondary | ICD-10-CM

## 2015-04-12 DIAGNOSIS — I69320 Aphasia following cerebral infarction: Secondary | ICD-10-CM

## 2015-04-12 DIAGNOSIS — R2681 Unsteadiness on feet: Secondary | ICD-10-CM | POA: Diagnosis not present

## 2015-04-12 DIAGNOSIS — R269 Unspecified abnormalities of gait and mobility: Secondary | ICD-10-CM | POA: Diagnosis not present

## 2015-04-12 NOTE — Therapy (Signed)
[facility] [address] [place], [place], [address] Phone: [phone]   Fax:  [phone]  Speech Language Pathology Treatment  Patient Details  Name: Brian [name] MRN: [id number] Date of Birth: 08/24/1928 Referring Provider: Rosalin Hawking M.D.  Encounter Date: 04/12/2015      End of Session - 04/12/15 1615    Visit Number 4   Number of Visits 25   Date for SLP Re-Evaluation 06/01/15   SLP Start Time 1022   SLP Stop Time  1101   SLP Time Calculation (min) 39 min   Activity Tolerance Patient tolerated treatment well      Past Medical History  Diagnosis Date  . Muscle tremor     saw neurology remotely elsewhere, was Rx inderal  . Crohn's ileocolitis ([place])   . Osteopenia     per DEXA 11/2008  . Hypertension 11/11    mild  . Hemorrhoids   . Hypertrophy of prostate     w/o UR obst & oth luts  . Peripheral neuropathy (HCC)     h/o neg w/u  . ED (erectile dysfunction)   . Hypertriglyceridemia   . COPD (chronic obstructive pulmonary disease) ([place])     recent dx--no acute problems  . Hiatal hernia 1956    found while in service  . Anxiety   . Atrial fibrillation with RVR ([place])   . Long term (current) use of anticoagulants 02/21/2011  . Serrated adenoma of colon 03/2010  . Renal cyst     bilateral  . Pulmonary fibrosis ([place]) 2015    Rx Esbriet ~ 10-2013 ([name]), Dr. [name]  . On home oxygen therapy     "2L; 20-24h for the past week" (01/26/2015)  . Emphysema lung ([place]) 2016    Dr. [name]  . B12 deficiency anemia   . Idiopathic pulmonary fibrosis ([place]) 2016    Dr. [name]  . Pneumonia ?2015  . CAP (community acquired pneumonia) 01/26/2015  . Osteoarthritis     "maybe in my hands, feet" (01/26/2015)  . Depression   . Squamous cell carcinoma of skin of scalp     tx'd ~ 79yrs; "froze them off"  . Squamous cell carcinoma, face      "froze them off"  . Melanoma of scalp [facility])     Past Surgical History   Procedure Laterality Date  . Colon resection  1984    resection of distal ileum and cecum w/ appendectomy, 18-inch small intestine, for Crohn's disease  . Colon surgery    . Appendectomy  1984  . Cataract extraction w/ intraocular lens  implant, bilateral Bilateral 2007  . Cholecystectomy  02/17/2011    Procedure: LAPAROSCOPIC CHOLECYSTECTOMY WITH INTRAOPERATIVE CHOLANGIOGRAM;  Surgeon: Edward Jolly, MD;  Location: [facility];  Service: General;  Laterality: N/A;  . Mohs surgery Right ~ 2014    "side of my scalp"    There were no vitals filed for this visit.  Visit Diagnosis: Dysphagia  Aphasia following cerebral infarction      Subjective Assessment - 04/12/15 1042    Subjective Pt had oral care <30 minutes prior to therapy time today.   Currently in Pain? Yes   Pain Score 6    Pain Location Buttocks   Pain Orientation Right   Pain Descriptors / Indicators Sore   Pain Type Chronic pain   Pain Onset More than a month ago   Pain Frequency Constant  ADULT SLP TREATMENT - 04/12/15 1045    General Information   Behavior/Cognition Alert;Cooperative;Pleasant mood;Requires cueing   Treatment Provided   Treatment provided Dysphagia   Dysphagia Treatment   Temperature Spikes Noted No   Respiratory Status Nasal cannula   Treatment Methods Skilled observation;Therapeutic exercise   Patient observed directly with PO's Yes   Type of PO's observed Ice chips   Pharyngeal Phase Signs & Symptoms Wet vocal quality  after initial swallow, usually. Cleared after second swallow   Other treatment/comments SLP facilitated dysphagia therapy using ice chips, effortful swallow paired with NMES placement 3b with mA not >10.0 . Pt's wife worked with pt to achieve Masako identical to how she did at home, with 70% success. SLP told pt wife to cont with this at home. SLP wrote basic information on white board to supplement verbal message to pt that SLP wanted pt to cont with  Masako at home. Fatigue noted approx 30 minutes - demo'd by tongue pumping and incr'd time for trigger of pharyngeal portion of the swallow.    Assessment / Recommendations / Plan   Plan Continue with current plan of care   Dysphagia Recommendations   Diet recommendations NPO  ice chips after <30 minutes oral care   Progression Toward Goals   Progression toward goals Progressing toward goals          SLP Education - 04/12/15 1615    Education provided Yes   Education Details Masako swallow added to HEP   Person(s) Educated Patient;Spouse   Methods Explanation;Handout   Comprehension Verbalized understanding          SLP Short Term Goals - 04/11/15 1149    SLP SHORT TERM GOAL #1   Title pt will demo efforftul swallows 60% of the time in >100 opportunities over 3 sessions   Time 3   Period Weeks   Status Revised   SLP SHORT TERM GOAL #2   Title pt will throat clear/cough with hydrophonic voice with rare nonverbal cues   Time 3   Period Weeks   Status On-going   SLP SHORT TERM GOAL #3   Title pt will complete HEP for dysphagia with modified independence (demo cues)   Time 3   Period Weeks   Status On-going          SLP Long Term Goals - 04/11/15 1149    SLP LONG TERM GOAL #1   Title pt will demo effortful swallows 75% of the time in >120 opportunities over 3 sessions   Time 7   Period Weeks   Status Revised   SLP LONG TERM GOAL #2   Title pt will complete dysphagia HEP with modified independence over 3 sessions   Time 7   Period Weeks   Status On-going   SLP LONG TERM GOAL #3   Title pt will clear voice/cough with hydrophonic voice independently   Time 7   Period Weeks   Status On-going   SLP LONG TERM GOAL #4   Title pt/wife will indicate 3 s/s aspiration PNA   Time 7   Period Weeks   Status On-going          Plan - 04/12/15 1616    Clinical Impression Statement Pt perfored Masako with consistent cues from wife. His pharyngeal clearance is  suspected improved due to clear voice after second swallow, consistently. Pt would cont to benefit from skilled ST to cont to maximize swallow function as this is most concerning to pt/family at  this time.   Speech Therapy Frequency 2x / week   Duration --  7 weeks   Treatment/Interventions Aspiration precaution training;Pharyngeal strengthening exercises;Diet toleration management by SLP;NMES;Cueing hierarchy;Functional tasks;Patient/family education;Compensatory strategies;SLP instruction and feedback;Language facilitation   Potential to Achieve Goals Fair   Potential Considerations Severity of impairments   Consulted and Agree with Plan of Care Family member/caregiver  wife - healthcare POA        Problem List Patient Active Problem List   Diagnosis Date Noted  . Gait disturbance, post-stroke 03/29/2015  . Atrial fibrillation with RVR ([place]) 03/13/2015  . Dysphagia   . Left middle cerebral artery stroke ([place]) 02/02/2015  . Dysphagia, pharyngoesophageal phase 02/02/2015  . Aphasia due to recent cerebrovascular accident 02/02/2015  . Bacterial pneumonia   . Stroke with cerebral ischemia ([place])   . Stroke ([place]) 01/31/2015  . CVA (cerebral infarction) 01/31/2015  . Aphasia   . Hypokalemia   . CAP (community acquired pneumonia) 01/26/2015  . Pulmonary fibrosis ([place]) 01/26/2015  . PCP NOTES >>>>>>>>>>>> 12/14/2014  . Vitamin D deficiency 11/04/2013  . Vasomotor rhinitis 09/22/2013  . Diarrhea 07/08/2013  . Crohn's disease ([place]) 07/08/2013  . Postinflammatory pulmonary fibrosis ([place]) 05/26/2013  . Anxiety state 05/20/2013  . Sprain of shoulder, left 06/01/2012  . Personal history of colonic polyps 04/12/2012  . Pulmonary nodules 03/02/2011  . Cough 03/02/2011  . Long term (current) use of anticoagulants 02/21/2011  . Atrial fibrillation with controlled ventricular response ([place]) 02/17/2011  . Annual physical exam 12/23/2010  . Dyspnea 09/03/2010  . HEMORRHOIDS-INTERNAL  03/08/2010  . [place] INTESTINE 03/08/2010  . RECTAL BLEEDING 03/08/2010  . HEMATOCHEZIA 03/06/2010  . Essential hypertension 02/04/2010  . HYPERTROPHY PROSTATE W/O UR OBST & OTH LUTS 12/10/2009  . Osteopenia 12/07/2009  . PERIPHERAL NEUROPATHY 08/29/2009  . FECAL INCONTINENCE 01/29/2009  . OSTEOARTHRITIS, ANKLE, RIGHT 11/01/2008  . ERECTILE DYSFUNCTION 12/02/2007  . TREMOR 12/02/2007  . CARCINOMA, SKIN, SQUAMOUS CELL 09/16/2006  . B12 DEFICIENCY 08/27/2006  . HYPERTRIGLYCERIDEMIA 08/27/2006    [place] , [place], [place]  04/12/2015, 4:21 PM  [facility] [address] [place], [place], [address] Phone: [phone]   Fax:  [phone]   Name: Brian [name] MRN: [id number] Date of Birth: Jul 26, 1928

## 2015-04-12 NOTE — Patient Instructions (Signed)
Continue with "tongue bite swallow" at home, 20 reps split up into 4 sets of 5, twice a day.

## 2015-04-15 ENCOUNTER — Other Ambulatory Visit: Payer: Self-pay | Admitting: Physical Medicine and Rehabilitation

## 2015-04-17 ENCOUNTER — Telehealth: Payer: Self-pay | Admitting: Internal Medicine

## 2015-04-17 NOTE — Telephone Encounter (Signed)
Do you know anything about this? Please advise

## 2015-04-17 NOTE — Telephone Encounter (Signed)
Caller name: [name] Relationship to patient: Son Can be reached: [phone]   Reason for call: Son called to inform Dr. Larose Kells that patient does have appt at [facility] on 2/2. Also would like for Dr. Larose Kells to order a CT to be done in [place]. States that he drove to [name] this morning thinking patient had an appt for a CT, but found out that it had been rescheduled and patient's wife had forgotten. Plse adv if he can get CT done here.

## 2015-04-18 ENCOUNTER — Encounter: Payer: Self-pay | Admitting: Neurology

## 2015-04-18 ENCOUNTER — Ambulatory Visit: Payer: Medicare Other | Admitting: Physical Therapy

## 2015-04-18 ENCOUNTER — Ambulatory Visit: Payer: Medicare Other

## 2015-04-18 ENCOUNTER — Encounter: Payer: Self-pay | Admitting: Physical Therapy

## 2015-04-18 ENCOUNTER — Ambulatory Visit (INDEPENDENT_AMBULATORY_CARE_PROVIDER_SITE_OTHER): Payer: Medicare Other | Admitting: Neurology

## 2015-04-18 VITALS — BP 111/67 | HR 77 | Ht 71.0 in | Wt 139.0 lb

## 2015-04-18 DIAGNOSIS — R4701 Aphasia: Secondary | ICD-10-CM

## 2015-04-18 DIAGNOSIS — R1310 Dysphagia, unspecified: Secondary | ICD-10-CM | POA: Diagnosis not present

## 2015-04-18 DIAGNOSIS — E785 Hyperlipidemia, unspecified: Secondary | ICD-10-CM

## 2015-04-18 DIAGNOSIS — I63412 Cerebral infarction due to embolism of left middle cerebral artery: Secondary | ICD-10-CM | POA: Diagnosis not present

## 2015-04-18 DIAGNOSIS — Z7901 Long term (current) use of anticoagulants: Secondary | ICD-10-CM

## 2015-04-18 DIAGNOSIS — I481 Persistent atrial fibrillation: Secondary | ICD-10-CM

## 2015-04-18 DIAGNOSIS — R531 Weakness: Secondary | ICD-10-CM

## 2015-04-18 DIAGNOSIS — I69320 Aphasia following cerebral infarction: Secondary | ICD-10-CM | POA: Diagnosis not present

## 2015-04-18 DIAGNOSIS — R269 Unspecified abnormalities of gait and mobility: Secondary | ICD-10-CM | POA: Diagnosis not present

## 2015-04-18 DIAGNOSIS — R2681 Unsteadiness on feet: Secondary | ICD-10-CM | POA: Diagnosis not present

## 2015-04-18 DIAGNOSIS — I4819 Other persistent atrial fibrillation: Secondary | ICD-10-CM

## 2015-04-18 NOTE — Patient Instructions (Signed)
-   continue eliquis and pravastatin for stroke prevention - continue speech therapy for language - follow with speech therapist for swallowing and take off PEG if able.  - follow up with [name] pulmonary as scheduled - Follow up with your primary care physician for stroke risk factor modification. Recommend maintain blood pressure goal around 120-130/80, diabetes with hemoglobin A1c goal below 6.5% and lipids with LDL cholesterol goal below 70 mg/dL.  - check BP at home. - follow up in 3 months.

## 2015-04-18 NOTE — Therapy (Signed)
[facility] [address] [place], [place], [address] Phone: [phone]   Fax:  [phone]  Physical Therapy Evaluation  Patient Details  Name: Brian [name] MRN: [id number] Date of Birth: 01-04-1929 Referring Provider: Alysia Penna, MD  Encounter Date: 04/18/2015      PT End of Session - 04/18/15 1903    Visit Number 1   Number of Visits 9  eval + 8 sessions   Date for PT Re-Evaluation 05/18/15   Authorization Type Medicare primary; [employer] secondary - G Codes required   PT Start Time [id number]   PT Stop Time 1530   PT Time Calculation (min) 43 min   Equipment Utilized During Treatment Other (comment)  gait belt not used due to g tube   Activity Tolerance Patient tolerated treatment well   Behavior During Therapy Restless      Past Medical History  Diagnosis Date  . Muscle tremor     saw neurology remotely elsewhere, was Rx inderal  . Crohn's ileocolitis ([place])   . Osteopenia     per DEXA 11/2008  . Hypertension 11/11    mild  . Hemorrhoids   . Hypertrophy of prostate     w/o UR obst & oth luts  . Peripheral neuropathy (HCC)     h/o neg w/u  . ED (erectile dysfunction)   . Hypertriglyceridemia   . COPD (chronic obstructive pulmonary disease) ([place])     recent dx--no acute problems  . Hiatal hernia 1956    found while in service  . Anxiety   . Atrial fibrillation with RVR ([place])   . Long term (current) use of anticoagulants 02/21/2011  . Serrated adenoma of colon 03/2010  . Renal cyst     bilateral  . Pulmonary fibrosis ([place]) 2015    Rx Esbriet ~ 10-2013 ([name]), Dr. [name]  . On home oxygen therapy     "2L; 20-24h for the past week" (01/26/2015)  . Emphysema lung ([place]) 2016    Dr. [name]  . B12 deficiency anemia   . Idiopathic pulmonary fibrosis ([place]) 2016    Dr. [name]  . Pneumonia ?2015  . CAP (community acquired pneumonia) 01/26/2015  . Osteoarthritis     "maybe in my hands, feet" (01/26/2015)   . Depression   . Squamous cell carcinoma of skin of scalp     tx'd ~ 55yrs; "froze them off"  . Squamous cell carcinoma, face      "froze them off"  . Melanoma of scalp ([place])   . Stroke [facility])     Past Surgical History  Procedure Laterality Date  . Colon resection  1984    resection of distal ileum and cecum w/ appendectomy, 18-inch small intestine, for Crohn's disease  . Colon surgery    . Appendectomy  1984  . Cataract extraction w/ intraocular lens  implant, bilateral Bilateral 2007  . Cholecystectomy  02/17/2011    Procedure: LAPAROSCOPIC CHOLECYSTECTOMY WITH INTRAOPERATIVE CHOLANGIOGRAM;  Surgeon: Edward Jolly, MD;  Location: [facility];  Service: General;  Laterality: N/A;  . Mohs surgery Right ~ 2014    "side of my scalp"    There were no vitals filed for this visit.  Visit Diagnosis:  Abnormality of gait - Plan: PT plan of care cert/re-cert  Unsteadiness - Plan: PT plan of care cert/re-cert  Weakness generalized - Plan: PT plan of care cert/re-cert      Subjective Assessment - 04/18/15 1908    Pertinent History --  [facility] PT Assessment - 04/18/15 0001    Assessment   Medical Diagnosis L MCA CVA   Referring Provider Alysia Penna, MD   Onset Date/Surgical Date 12/01/14   Precautions   Precautions Fall;Other (comment)   Precaution Comments G tube; monitor SpO2. Pt on 3 L/min supplemental O2 for activity   Restrictions   Weight Bearing Restrictions No   Balance Screen   Has the patient fallen in the past 6 months No   Has the patient had a decrease in activity level because of a fear of falling?  Yes   Is the patient reluctant to leave their home because of a fear of falling?  Yes   [place] Private residence   Living Arrangements Spouse/significant other   Available Help at Discharge Family   Type of [place] Access Level entry   [place] - single point;Walker - 2  wheels   Prior Function   Level of Independence Independent   Vocation Full time employment   Vocation Requirements retired Librarian, academic for [employer] loves woodworking, gardening   Cognition   Overall Cognitive Status Difficult to assess   Difficult to assess due to Impaired communication  receptive aphasia   Sensation   Light Touch Not tested   Proprioception Not tested   Additional Comments Unable to formally assess sensation/proprioception due to aphasia   Coordination   Gross Motor Movements are Fluid and Coordinated Yes   ROM / Strength   AROM / PROM / Strength AROM;PROM;Strength   AROM   Overall AROM  Deficits   Overall AROM Comments Limited B knee extension (on R > L knee); grossly within 5 degrees of neutral bilat.   PROM   Overall PROM  Deficits   Overall PROM Comments B knee extension limited grossly -3 degrees on L, -5 degrees on R.   Strength   Overall Strength Deficits   Overall Strength Comments Difficult to formally assess due to aphasia; however, functional movement suggests strength is [facility] with exception of 4-/5 to 4/5 in B hip extensors/abductors.    Transfers   Transfers Sit to Stand;Stand to Sit   Sit to Stand 6: Modified independent (Device/Increase time)   Stand to Sit 6: Modified independent (Device/Increase time)   Balance   Balance Assessed Yes                   [facility] Adult PT Treatment/Exercise - 04/18/15 0001    Ambulation/Gait   Ambulation/Gait Yes   Ambulation/Gait Assistance 5: Supervision   Ambulation/Gait Assistance Details Supervision for stability/balance, safety with management of SPC and supplemental O2 tank   Ambulation Distance (Feet) 200 Feet   Assistive device Straight cane   Gait Pattern Step-through pattern;Decreased stride length;Trunk flexed;Right foot flat;Left foot flat;Wide base of support   Ambulation Surface Level;Indoor   Gait velocity 2.18 ft/sec   Standardized Balance Assessment    Standardized Balance Assessment Berg Balance Test   Berg Balance Test   Sit to Stand Able to stand without using hands and stabilize independently   Standing Unsupported Able to stand safely 2 minutes   Sitting with Back Unsupported but Feet Supported on Floor or Stool Able to sit safely and securely 2 minutes   Stand to Sit Sits safely with minimal use of hands   Transfers Able to transfer safely, minor use of hands   Standing Unsupported with Eyes Closed  Able to stand 10 seconds safely   Standing Ubsupported with Feet Together Able to place feet together independently and stand for 1 minute with supervision   From Standing, Reach Forward with Outstretched Arm Can reach forward >12 cm safely (5")   From Standing Position, Pick up Object from [place] to pick up shoe safely and easily   From Standing Position, Turn to Look Behind Over each Shoulder Looks behind one side only/other side shows less weight shift   Turn 360 Degrees Needs close supervision or verbal cueing  supervision for safety with O2 tubing   Standing Unsupported, Alternately Place Feet on Step/Stool Able to complete 4 steps without aid or supervision   Standing Unsupported, One Foot in [place] to plae foot ahead of the other independently and hold 30 seconds   Standing on One Leg Able to lift leg independently and hold equal to or more than 3 seconds   Total Score 45                PT Education - 04/18/15 1626    Education provided Yes   Education Details PT eval findings, goals, and POC.   Person(s) Educated Patient;Spouse   Methods Explanation   Comprehension Verbalized understanding          PT Short Term Goals - 04/18/15 1850    PT SHORT TERM GOAL #1   Title STG's = LTG's           PT Long Term Goals - 04/18/15 1850    PT LONG TERM GOAL #1   Title Pt will perform home exercises with assist of wife to maximize functional gains made in PT.  (Target date: 05/16/15)   PT LONG TERM GOAL #2    Title Determine appropriate assistive device to promote pt safety/independence with household/community mobility. (Target date: 05/16/15)   PT LONG TERM GOAL #3   Title Improve Berg score from 45 to 49/56 to indicate decreased fall risk, improved standing balance.  (Target date: 05/16/15)   PT LONG TERM GOAL #4   Title Pt will improve gait velocity from 2.18 ft/sec to > 2.62 ft/sec to indicate functional status of community ambulator.  (Target date: 05/16/15)   PT LONG TERM GOAL #5   Title Pt will ambulate x200' over level/indoor surfaces with mod I with LRAD with appropriate use of AD and safe management of O2 tank.  (Target date: 05/16/15)               Plan - 04/18/15 1905    Clinical Impression Statement Pt is an 82 y/o M referred to outpatient PT to address functional limitations associated with L MCA CVA sustained 01/31/16. Pt hospitalized from 11/9 - 02/16/15 (including inpatient rehab from 11/11 - 02/16/15).  PMH significnat for: idiopathic pulmonary fibrosis (2 L O2 with exertion) and  A-fib. PT evaluation reveals the following: gait impairments; generalized weakness; balance impairments; Berg score suggestive of fall risk; gait speed indicative of functional status of limited community ambulator. Per wife, pt requires constant supervision to safely perform mobility while managing O2 tubing at home. Pt will benefit from skilled outpatient PT 2x/week for 4 weeks to address said impairments and to make recommendations for assistive device to increase safety and energy efficiency of mobility.   Pt will benefit from skilled therapeutic intervention in order to improve on the following deficits Abnormal gait;Decreased activity tolerance;Decreased endurance;Cardiopulmonary status limiting activity;Decreased balance;Decreased safety awareness;Postural dysfunction;Decreased knowledge of use of DME;Decreased cognition;Decreased strength;Impaired flexibility;Decreased range  of motion   Rehab Potential  Good   Clinical Impairments Affecting Rehab Potential receptive aphasia; cognitve impairments; pulmonary fibrosis; supportive wife   PT Frequency 2x / week   PT Duration 4 weeks   PT Treatment/Interventions ADLs/Self Care Home Management;DME Instruction;Gait training;Functional mobility training;Stair training;Therapeutic activities;Therapeutic exercise;Balance training;Orthotic Fit/Training;Patient/family education;Neuromuscular re-education;Vestibular   PT Next Visit Plan Initiate HEP. Trial rollator for O2 management, energy conservation, safety.   PT [place]?    Consulted and Agree with Plan of Care Patient;Family member/caregiver   Family Member Consulted wife          G-Codes - 2015/05/10 1847    Functional Assessment Tool Used Merrilee Jansky 45/56; gait velocity 2.18 ft/sec   Functional Limitation Mobility: Walking and moving around   Mobility: Walking and Moving Around Current Status [phone]) At least 20 percent but less than 40 percent impaired, limited or restricted   Mobility: Walking and Moving Around Goal Status [phone]) At least 1 percent but less than 20 percent impaired, limited or restricted       Problem List Patient Active Problem List   Diagnosis Date Noted  . Cerebral infarction due to embolism of left middle cerebral artery ([place]) 05/10/15  . Chronic anticoagulation 05/10/2015  . HLD (hyperlipidemia) 2015-05-10  . Persistent atrial fibrillation ([place]) 10-May-2015  . Gait disturbance, post-stroke 03/29/2015  . Atrial fibrillation with RVR ([place]) 03/13/2015  . Dysphagia   . Left middle cerebral artery stroke ([place]) 02/02/2015  . Dysphagia, pharyngoesophageal phase 02/02/2015  . Aphasia due to recent cerebrovascular accident 02/02/2015  . Bacterial pneumonia   . Stroke with cerebral ischemia ([place])   . Stroke ([place]) 01/31/2015  . CVA (cerebral infarction) 01/31/2015  . Aphasia   . Hypokalemia   . CAP (community acquired pneumonia) 01/26/2015  . Pulmonary  fibrosis ([place]) 01/26/2015  . PCP NOTES >>>>>>>>>>>> 12/14/2014  . Vitamin D deficiency 11/04/2013  . Vasomotor rhinitis 09/22/2013  . Diarrhea 07/08/2013  . Crohn's disease ([place]) 07/08/2013  . Postinflammatory pulmonary fibrosis ([place]) 05/26/2013  . Anxiety state 05/20/2013  . Sprain of shoulder, left 06/01/2012  . Personal history of colonic polyps 04/12/2012  . Pulmonary nodules 03/02/2011  . Cough 03/02/2011  . Long term (current) use of anticoagulants 02/21/2011  . Atrial fibrillation with controlled ventricular response ([place]) 02/17/2011  . Annual physical exam 12/23/2010  . Dyspnea 09/03/2010  . HEMORRHOIDS-INTERNAL 03/08/2010  . [place] INTESTINE 03/08/2010  . RECTAL BLEEDING 03/08/2010  . HEMATOCHEZIA 03/06/2010  . Essential hypertension 02/04/2010  . HYPERTROPHY PROSTATE W/O UR OBST & OTH LUTS 12/10/2009  . Osteopenia 12/07/2009  . PERIPHERAL NEUROPATHY 08/29/2009  . FECAL INCONTINENCE 01/29/2009  . OSTEOARTHRITIS, ANKLE, RIGHT 11/01/2008  . ERECTILE DYSFUNCTION 12/02/2007  . TREMOR 12/02/2007  . CARCINOMA, SKIN, SQUAMOUS CELL 09/16/2006  . B12 DEFICIENCY 08/27/2006  . HYPERTRIGLYCERIDEMIA 08/27/2006    Billie Ruddy, PT, DPT [facility] [address] [place], [place], [address] Phone: [phone]   Fax:  [phone] 05/10/2015, 7:20 PM   Name: Brian [name] MRN: [id number] Date of Birth: 1928-09-24

## 2015-04-18 NOTE — Progress Notes (Signed)
STROKE NEUROLOGY FOLLOW UP NOTE  NAME: Brian [name] DOB: 10/04/1928  REASON FOR VISIT: stroke follow up HISTORY FROM: wife and chart  Today we had the pleasure of seeing Brian WILTSIE in follow-up at our Neurology Clinic. Pt was accompanied by wife.   History Summary [name] is a 82 y.o. male with history of pulmonary fibrosis, atrial fibrillation on coumadin and crohn's disease presenting with right sided weakness and difficulty speaking to [facility] on 01/31/15. He did not receive IV t-PA due to elevated INR 1.9 on coumadin. MRI showed left MCA infarct. MRA and CTA showed posterior distal left MCA branch occlusion. TTE unremarkable. LDL 86 and A1C 5.6. His right-sided weakness resolved but continued to have receptive aphasia. His coumadin was switched to eliquis and started on low dose pravastatin on discharge.  Interval History During the interval time, the patient has been doing well from stroke standpoint. Still has receptive aphasia and currently works with speech therapy. The aphasia fluctuate at home more prominent with fatigue or tiredness. He still has swallow difficulty and still use TF via PEG. Follows with speech in march to see if PEG can be removed. He has follow up with pulmonary in [name] next week. Currently still on home O2. BP 111/67 today. He still has afib on eliquis and also on bystolic for rate control.  REVIEW OF SYSTEMS: Full 14 system review of systems performed and notable only for those listed below and in HPI above, all others are negative:  Constitutional:   Cardiovascular:  Ear/Nose/Throat:  Trouble swallowing Skin:  Eyes:   Respiratory:   Gastroitestinal:   Genitourinary:  Hematology/Lymphatic:   Endocrine:  Musculoskeletal:   Allergy/Immunology:   Neurological:   Psychiatric:  Sleep:   The following represents the patient's updated allergies and side effects list: No Known Allergies  The neurologically relevant items on the patient's problem  list were reviewed on today's visit.  Neurologic Examination  A problem focused neurological exam (12 or more points of the single system neurologic examination, vital signs counts as 1 point, cranial nerves count for 8 points) was performed.  Blood pressure 111/67, pulse 77, height 5\' 11"  (1.803 m), weight 139 lb (63.05 kg).  General - cachetic, well developed, in no apparent distress.  Ophthalmologic - Fundi not visualized due to noncooperatoin.  Cardiovascular - irregularly irregular heart rate and rhythm.  Mental Status -  Awake alert, pleasant, however still has significant receptive aphasia, not following simple commands without pantomime. Speech output fluent with incorrect answers. Not able to name or repeat.   Cranial Nerves II - XII - II - blinking to visual threat bilaterally. III, IV, VI - Extraocular movements intact. V - Facial sensation intact bilaterally. VII - Facial movement intact bilaterally. VIII - Vestibular intact bilaterally. X - Palate elevates symmetrically. XI - Chin turning & shoulder shrug intact bilaterally. XII - Tongue protrusion intact.  Motor Strength - The patient's strength was normal in all extremities and pronator drift was absent.  Bulk was normal and fasciculations were absent.   Motor Tone - Muscle tone was assessed at the neck and appendages and was normal.  Reflexes - The patient's reflexes were 1+ in all extremities and he had no pathological reflexes.  Sensory - Light touch, temperature/pinprick were assessed and were normal.    Coordination - The patient had normal movements in the hands and feet with no ataxia or dysmetria.  Tremor was absent.  Gait and Station - able to  walk without cane, slow cautious gait but stable.  Data reviewed: I personally reviewed the images and agree with the radiology interpretations.  Ct Head Wo Contrast 01/31/2015 Mild chronic microvascular ischemia. No acute abnormality.   Ct Angio Head & Neck  W/cm &/or Wo/cm 01/31/2015 1. No large vessel occlusion. 2. Left M2 MCA branch vessel occlusion. 3. No cervical carotid or vertebral artery stenosis. 4. 2 mm left posterior communicating artery infundibulum versus aneurysm. 5. Extensive right upper lobe lung consolidation with areas of cavitation, incompletely visualized. Mild mediastinal lymphadenopathy.   MRI & MRA Brain Wo Contrast 01/31/2015 1. Acute nonhemorrhagic posterior left MCA territory infarct is confirmed. 2. Occlusion of distal left posterior MCA branch vessel. There is opacification of an additional left MCA branch vessels since the earlier CTA study. 3. Extensive white matter disease is present in addition to the acute infarct.   Dg Chest Port 1 View 01/31/2015 Slight worsening of RIGHT upper lobe pneumonia superimposed on emphysematous change.   2D Echocardiogram  - Left ventricle: The cavity size was normal. Wall thickness wasincreased in a pattern of mild LVH. Systolic function was normal.The estimated ejection fraction was in the range of 55% to 60%.Regional wall motion abnormalities cannot be excluded. - Mitral valve: Calcified annulus. There was mild regurgitation. - Right atrium: The atrium was mildly dilated. - Pulmonary arteries: PA peak pressure: 34 mm Hg (S).  Component     Latest Ref Rng 01/31/2015 02/01/2015  Cholesterol     0 - 200 mg/dL  103  Triglycerides     <150 mg/dL  72  HDL Cholesterol     >40 mg/dL  33 (L)  Total CHOL/HDL Ratio       3.1  VLDL     0 - 40 mg/dL  14  LDL (calc)     0 - 99 mg/dL  56  Hemoglobin A1C     4.8 - 5.6 % 5.6   Mean Plasma Glucose      114     Assessment: As you may recall, he is a 82 y.o. Caucasian male with PMH of pulmonary fibrosis, atrial fibrillation on coumadin and crohn's disease admitted on 01/31/15 for left MCA infarct. MRA and CTA showed posterior distal left MCA branch occlusion. TTE unremarkable. LDL 86 and A1C 5.6. His INR on admission 1.9. His  right-sided weakness resolved but continued to have receptive aphasia. His coumadin was switched to eliquis and started on low dose pravastatin on discharge. During the interval time, he is doing well from stroke standpoint, but still has receptive aphasia and dysphagia. Working with speech therapy now. Tolerating with eliquis.  Plan:  - continue eliquis and pravastatin for stroke prevention - continue speech therapy for language - follow with speech therapist for dysphagia.  - follow up with [name] pulmonary as scheduled - Follow up with your primary care physician for stroke risk factor modification. Recommend maintain blood pressure goal around 120-130/80, diabetes with hemoglobin A1c goal below 6.5% and lipids with LDL cholesterol goal below 70 mg/dL.  - check BP at home. - follow up in 3 months.  I spent more than 25 minutes of face to face time with the patient. Greater than 50% of time was spent in counseling and coordination of care.    No orders of the defined types were placed in this encounter.    Meds ordered this encounter  Medications  . Vitamin D, Ergocalciferol, (DRISDOL) 50000 units CAPS capsule    Sig: TAKE 1 CAPSULE (50,000  UNITS TOTAL) BY MOUTH ONCE A WEEK.    Refill:  3    Patient Instructions  - continue eliquis and pravastatin for stroke prevention - continue speech therapy for language - follow with speech therapist for swallowing and take off PEG if able.  - follow up with [name] pulmonary as scheduled - Follow up with your primary care physician for stroke risk factor modification. Recommend maintain blood pressure goal around 120-130/80, diabetes with hemoglobin A1c goal below 6.5% and lipids with LDL cholesterol goal below 70 mg/dL.  - check BP at home. - follow up in 3 months.    Rosalin Hawking, MD PhD [facility] [address] [place], [place] [address] [phone]

## 2015-04-18 NOTE — Therapy (Signed)
[facility] [address] [place], [place], [address] Phone: [phone]   Fax:  [phone]  Speech Language Pathology Treatment  Patient Details  Name: Brian [name] MRN: [id number] Date of Birth: [birth year] Referring Provider: Rosalin Hawking M.D.  Encounter Date: 04/18/2015      End of Session - 04/18/15 1639    Visit Number 5   Number of Visits 25   Date for SLP Re-Evaluation 06/01/15   SLP Start Time 1532   SLP Stop Time  [id number]   SLP Time Calculation (min) 42 min   Activity Tolerance Patient tolerated treatment well      Past Medical History  Diagnosis Date  . Muscle tremor     saw neurology remotely elsewhere, was Rx inderal  . Crohn's ileocolitis ([place])   . Osteopenia     per DEXA 11/2008  . Hypertension 11/11    mild  . Hemorrhoids   . Hypertrophy of prostate     w/o UR obst & oth luts  . Peripheral neuropathy (HCC)     h/o neg w/u  . ED (erectile dysfunction)   . Hypertriglyceridemia   . COPD (chronic obstructive pulmonary disease) ([place])     recent dx--no acute problems  . Hiatal hernia 1956    found while in service  . Anxiety   . Atrial fibrillation with RVR ([place])   . Long term (current) use of anticoagulants 02/21/2011  . Serrated adenoma of colon 03/2010  . Renal cyst     bilateral  . Pulmonary fibrosis ([place]) 2015    Rx Esbriet ~ 10-2013 ([name]), Dr. [name]  . On home oxygen therapy     "2L; 20-24h for the past week" (01/26/2015)  . Emphysema lung ([place]) 2016    Dr. [name]  . B12 deficiency anemia   . Idiopathic pulmonary fibrosis ([place]) 2016    Dr. [name]  . Pneumonia ?2015  . CAP (community acquired pneumonia) 01/26/2015  . Osteoarthritis     "maybe in my hands, feet" (01/26/2015)  . Depression   . Squamous cell carcinoma of skin of scalp     tx'd ~ 46yrs; "froze them off"  . Squamous cell carcinoma, face      "froze them off"  . Melanoma of scalp ([place])   . Stroke [facility])     Past  Surgical History  Procedure Laterality Date  . Colon resection  1984    resection of distal ileum and cecum w/ appendectomy, 18-inch small intestine, for Crohn's disease  . Colon surgery    . Appendectomy  1984  . Cataract extraction w/ intraocular lens  implant, bilateral Bilateral 2007  . Cholecystectomy  02/17/2011    Procedure: LAPAROSCOPIC CHOLECYSTECTOMY WITH INTRAOPERATIVE CHOLANGIOGRAM;  Surgeon: Edward Jolly, MD;  Location: [facility];  Service: General;  Laterality: N/A;  . Mohs surgery Right ~ 2014    "side of my scalp"    There were no vitals filed for this visit.  Visit Diagnosis: Dysphagia  Aphasia following cerebral infarction      Subjective Assessment - 04/18/15 1539    Subjective Pt had oral care <30 minutes prior to therapy time today.   Patient is accompained by: Family member  wife   Currently in Pain? No/denies               ADULT SLP TREATMENT - 04/18/15 1544    General Information   Behavior/Cognition Alert;Cooperative;Pleasant mood;Requires cueing   Treatment Provided   Treatment provided  Dysphagia   Dysphagia Treatment   Temperature Spikes Noted No   Respiratory Status Nasal cannula   Treatment Methods Skilled observation;Therapeutic exercise   Patient observed directly with PO's Yes   Type of PO's observed Ice chips   Pharyngeal Phase Signs & Symptoms Wet vocal quality  consistently after first swallow, clear after second swallow   Other treatment/comments NMES utilized with aspects of pt's HEP and with ice chips. Placement 3b with mA not >10.5. Told wife to bring in popsicle next time if desired and pt can eat that with NMES placed. After 18 minutes noted some tongue pumping for third swallow. Wife reports pt is performing Masako more consistently at home.  Pt performed with SLP with usual max cues - suspect apraxia was hindering pt success. After 25 minutes with NMES placed minimal hydrophonic voice heard after 2nd swallow as well as  first, cleared consistently after third swalow.   Assessment / Recommendations / Plan   Plan Continue with current plan of care   Dysphagia Recommendations   Diet recommendations NPO   Progression Toward Goals   Progression toward goals Progressing toward goals            SLP Short Term Goals - 04/18/15 1641    SLP SHORT TERM GOAL #1   Title pt will demo efforftul swallows 60% of the time in >100 opportunities over 3 sessions   Time 2   Period Weeks   Status Revised   SLP SHORT TERM GOAL #2   Title pt will throat clear/cough with hydrophonic voice with rare nonverbal cues   Time 2   Period Weeks   Status On-going   SLP SHORT TERM GOAL #3   Title pt will complete HEP for dysphagia with modified independence (demo cues)   Time 2   Period Weeks   Status On-going          SLP Long Term Goals - 04/18/15 1642    SLP LONG TERM GOAL #1   Title pt will demo effortful swallows 75% of the time in >120 opportunities over 3 sessions   Time 6   Period Weeks   Status Revised   SLP LONG TERM GOAL #2   Title pt will complete dysphagia HEP with modified independence over 3 sessions   Time 6   Period Weeks   Status On-going   SLP LONG TERM GOAL #3   Title pt will clear voice/cough with hydrophonic voice independently   Time 6   Period Weeks   Status On-going   SLP LONG TERM GOAL #4   Title pt/wife will indicate 3 s/s aspiration PNA   Time 6   Period Weeks   Status On-going          Plan - 04/18/15 1639    Clinical Impression Statement Pt perfored Masako with initial occasional mod cues from wife due to comprehension deficit. Pt would cont to benefit from skilled ST to cont to maximize swallow function as this is most concerning to pt/family at this time.   Speech Therapy Frequency 3x / week   Duration --  6 weeks   Treatment/Interventions Aspiration precaution training;Pharyngeal strengthening exercises;Diet toleration management by SLP;NMES;Cueing hierarchy;Functional  tasks;Patient/family education;Compensatory strategies;SLP instruction and feedback;Language facilitation   Potential to Achieve Goals Fair   Potential Considerations Severity of impairments   Consulted and Agree with Plan of Care Family member/caregiver  wife - healthcare POA        Problem List Patient Active Problem List  Diagnosis Date Noted  . Cerebral infarction due to embolism of left middle cerebral artery ([place]) 04/18/2015  . Chronic anticoagulation 04/18/2015  . HLD (hyperlipidemia) 04/18/2015  . Persistent atrial fibrillation ([place]) 04/18/2015  . Gait disturbance, post-stroke 03/29/2015  . Atrial fibrillation with RVR ([place]) 03/13/2015  . Dysphagia   . Left middle cerebral artery stroke ([place]) 02/02/2015  . Dysphagia, pharyngoesophageal phase 02/02/2015  . Aphasia due to recent cerebrovascular accident 02/02/2015  . Bacterial pneumonia   . Stroke with cerebral ischemia ([place])   . Stroke ([place]) 01/31/2015  . CVA (cerebral infarction) 01/31/2015  . Aphasia   . Hypokalemia   . CAP (community acquired pneumonia) 01/26/2015  . Pulmonary fibrosis ([place]) 01/26/2015  . PCP NOTES >>>>>>>>>>>> 12/14/2014  . Vitamin D deficiency 11/04/2013  . Vasomotor rhinitis 09/22/2013  . Diarrhea 07/08/2013  . Crohn's disease ([place]) 07/08/2013  . Postinflammatory pulmonary fibrosis ([place]) 05/26/2013  . Anxiety state 05/20/2013  . Sprain of shoulder, left 06/01/2012  . Personal history of colonic polyps 04/12/2012  . Pulmonary nodules 03/02/2011  . Cough 03/02/2011  . Long term (current) use of anticoagulants 02/21/2011  . Atrial fibrillation with controlled ventricular response ([place]) 02/17/2011  . Annual physical exam 12/23/2010  . Dyspnea 09/03/2010  . HEMORRHOIDS-INTERNAL 03/08/2010  . [place] INTESTINE 03/08/2010  . RECTAL BLEEDING 03/08/2010  . HEMATOCHEZIA 03/06/2010  . Essential hypertension 02/04/2010  . HYPERTROPHY PROSTATE W/O UR OBST & OTH LUTS 12/10/2009   . Osteopenia 12/07/2009  . PERIPHERAL NEUROPATHY 08/29/2009  . FECAL INCONTINENCE 01/29/2009  . OSTEOARTHRITIS, ANKLE, RIGHT 11/01/2008  . ERECTILE DYSFUNCTION 12/02/2007  . TREMOR 12/02/2007  . CARCINOMA, SKIN, SQUAMOUS CELL 09/16/2006  . B12 DEFICIENCY 08/27/2006  . HYPERTRIGLYCERIDEMIA 08/27/2006    [place] , [place], [place]  04/18/2015, 4:43 PM  [employer] [address] [place], [place], [address] Phone: [phone]   Fax:  [phone]   Name: Brian [name] MRN: [id number] Date of Birth: 04/28/1928

## 2015-04-18 NOTE — Telephone Encounter (Signed)
LMOM on son's phone, then I spoke w/ pt's wife:  He is doing well, no major problems with difficulty breathing or cough. My advice is to first see the doctors at [facility] and then decide if a CT is still needed (in light of the recent stroke).

## 2015-04-18 NOTE — Telephone Encounter (Signed)
Spoke with the patient's son, he is in agreement

## 2015-04-18 NOTE — Telephone Encounter (Signed)
Caller name: [name]  Relationship to patient: Son  Can be reached: [phone]  States he just missed a call from our office. OK to leave him a detailed msg if he cannot get to his phone in time.

## 2015-04-19 ENCOUNTER — Ambulatory Visit: Payer: Medicare Other

## 2015-04-19 ENCOUNTER — Telehealth: Payer: Self-pay

## 2015-04-19 DIAGNOSIS — R2681 Unsteadiness on feet: Secondary | ICD-10-CM | POA: Diagnosis not present

## 2015-04-19 DIAGNOSIS — I69320 Aphasia following cerebral infarction: Secondary | ICD-10-CM | POA: Diagnosis not present

## 2015-04-19 DIAGNOSIS — R1310 Dysphagia, unspecified: Secondary | ICD-10-CM | POA: Diagnosis not present

## 2015-04-19 DIAGNOSIS — R269 Unspecified abnormalities of gait and mobility: Secondary | ICD-10-CM | POA: Diagnosis not present

## 2015-04-19 DIAGNOSIS — R531 Weakness: Secondary | ICD-10-CM | POA: Diagnosis not present

## 2015-04-19 NOTE — Therapy (Signed)
[facility] [address] [place], [place], [address] Phone: [phone]   Fax:  [phone]  Speech Language Pathology Treatment  Patient Details  Name: Brian [name] MRN: [id number] Date of Birth: [birth year] Referring Provider: Rosalin Hawking M.D.  Encounter Date: 04/19/2015      End of Session - 04/19/15 1400    Visit Number 6   Number of Visits 25   Date for SLP Re-Evaluation 06/01/15   SLP Start Time 1017   SLP Stop Time  1100   SLP Time Calculation (min) 43 min   Activity Tolerance Patient tolerated treatment well      Past Medical History  Diagnosis Date  . Muscle tremor     saw neurology remotely elsewhere, was Rx inderal  . Crohn's ileocolitis ([place])   . Osteopenia     per DEXA 11/2008  . Hypertension 11/11    mild  . Hemorrhoids   . Hypertrophy of prostate     w/o UR obst & oth luts  . Peripheral neuropathy (HCC)     h/o neg w/u  . ED (erectile dysfunction)   . Hypertriglyceridemia   . COPD (chronic obstructive pulmonary disease) ([place])     recent dx--no acute problems  . Hiatal hernia 1956    found while in service  . Anxiety   . Atrial fibrillation with RVR ([place])   . Long term (current) use of anticoagulants 02/21/2011  . Serrated adenoma of colon 03/2010  . Renal cyst     bilateral  . Pulmonary fibrosis ([place]) 2015    Rx Esbriet ~ 10-2013 ([name]), Dr. [name]  . On home oxygen therapy     "2L; 20-24h for the past week" (01/26/2015)  . Emphysema lung ([place]) 2016    Dr. [name]  . B12 deficiency anemia   . Idiopathic pulmonary fibrosis ([place]) 2016    Dr. [name]  . Pneumonia ?2015  . CAP (community acquired pneumonia) 01/26/2015  . Osteoarthritis     "maybe in my hands, feet" (01/26/2015)  . Depression   . Squamous cell carcinoma of skin of scalp     tx'd ~ 110yrs; "froze them off"  . Squamous cell carcinoma, face      "froze them off"  . Melanoma of scalp ([place])   . Stroke [facility])     Past  Surgical History  Procedure Laterality Date  . Colon resection  1984    resection of distal ileum and cecum w/ appendectomy, 18-inch small intestine, for Crohn's disease  . Colon surgery    . Appendectomy  1984  . Cataract extraction w/ intraocular lens  implant, bilateral Bilateral 2007  . Cholecystectomy  02/17/2011    Procedure: LAPAROSCOPIC CHOLECYSTECTOMY WITH INTRAOPERATIVE CHOLANGIOGRAM;  Surgeon: Edward Jolly, MD;  Location: [facility];  Service: General;  Laterality: N/A;  . Mohs surgery Right ~ 2014    "side of my scalp"    There were no vitals filed for this visit.  Visit Diagnosis: Dysphagia      Subjective Assessment - 04/19/15 1038    Subjective Pt had oral care <30 minutes prior to therapy time today.   Patient is accompained by: --  wife               ADULT SLP TREATMENT - 04/19/15 1039    General Information   Behavior/Cognition Alert;Cooperative;Pleasant mood;Requires cueing   Treatment Provided   Treatment provided Dysphagia   Dysphagia Treatment   Temperature Spikes Noted No  Respiratory Status Nasal cannula   Treatment Methods Skilled observation;Therapeutic exercise   Patient observed directly with PO's Yes   Type of PO's observed Ice chips   Pharyngeal Phase Signs & Symptoms Wet vocal quality  after first swallow consistently and cleared after 2nd swall   Other treatment/comments SLP used NMES for swallowing to address pt's dysphagia due to muscle weakness with placement 3a and mA not >9.5. After 11 minutes SLP noted tongue pumping rarely  Hydrophonic voice occurring rarely between swallows, which SLP needed to cue pt for cough/throat clear. Pt with forceful "mmmmmhmmm" and forceful "AH!" instead fo cough/throat clear due to oral apraxia. After 20-25 minutes with NMES placed, hydrophonic voice following second swallow noted, but always cleared after third swallow.Effortful swallow today was approx 60% of swallows.   Pain Assessment   Pain  Assessment No/denies pain   Assessment / Recommendations / Plan   Plan Continue with current plan of care   Dysphagia Recommendations   Diet recommendations NPO   Progression Toward Goals   Progression toward goals Progressing toward goals            SLP Short Term Goals - 04/19/15 1401    SLP SHORT TERM GOAL #1   Title pt will demo efforftul swallows 60% of the time in >100 opportunities over 3 sessions   Baseline one session 04-19-15   Time 2   Period Weeks   Status Achieved   SLP SHORT TERM GOAL #2   Title pt will throat clear/cough with hydrophonic voice with rare nonverbal cues   Time 2   Period Weeks   Status On-going   SLP SHORT TERM GOAL #3   Title pt will complete HEP for dysphagia with modified independence (demo cues)   Time 2   Period Weeks   Status On-going          SLP Long Term Goals - 04/19/15 1401    SLP LONG TERM GOAL #1   Title pt will demo effortful swallows 75% of the time in >120 opportunities over 3 sessions   Time 6   Period Weeks   Status Revised   SLP LONG TERM GOAL #2   Title pt will complete dysphagia HEP with modified independence over 3 sessions   Time 6   Period Weeks   Status On-going   SLP LONG TERM GOAL #3   Title pt will clear voice/cough with hydrophonic voice independently   Time 6   Period Weeks   Status On-going   SLP LONG TERM GOAL #4   Title pt/wife will indicate 3 s/s aspiration PNA   Time 6   Period Weeks   Status On-going          Plan - 04/19/15 1400    Clinical Impression Statement  Pt would cont to benefit from skilled ST to cont to maximize swallow function as this is most concerning to pt/family at this time.   Speech Therapy Frequency 3x / week   Duration --  6 weeks   Treatment/Interventions Aspiration precaution training;Pharyngeal strengthening exercises;Diet toleration management by SLP;NMES;Cueing hierarchy;Functional tasks;Patient/family education;Compensatory strategies;SLP instruction and  feedback;Language facilitation   Potential to Achieve Goals Fair   Potential Considerations Severity of impairments   Consulted and Agree with Plan of Care Family member/caregiver  wife - healthcare POA        Problem List Patient Active Problem List   Diagnosis Date Noted  . Cerebral infarction due to embolism of left middle cerebral artery ([place]) 04/18/2015  .  Chronic anticoagulation 04/18/2015  . HLD (hyperlipidemia) 04/18/2015  . Persistent atrial fibrillation ([place]) 04/18/2015  . Gait disturbance, post-stroke 03/29/2015  . Atrial fibrillation with RVR ([place]) 03/13/2015  . Dysphagia   . Left middle cerebral artery stroke ([place]) 02/02/2015  . Dysphagia, pharyngoesophageal phase 02/02/2015  . Aphasia due to recent cerebrovascular accident 02/02/2015  . Bacterial pneumonia   . Stroke with cerebral ischemia ([place])   . Stroke ([place]) 01/31/2015  . CVA (cerebral infarction) 01/31/2015  . Aphasia   . Hypokalemia   . CAP (community acquired pneumonia) 01/26/2015  . Pulmonary fibrosis ([place]) 01/26/2015  . PCP NOTES >>>>>>>>>>>> 12/14/2014  . Vitamin D deficiency 11/04/2013  . Vasomotor rhinitis 09/22/2013  . Diarrhea 07/08/2013  . Crohn's disease ([place]) 07/08/2013  . Postinflammatory pulmonary fibrosis ([place]) 05/26/2013  . Anxiety state 05/20/2013  . Sprain of shoulder, left 06/01/2012  . Personal history of colonic polyps 04/12/2012  . Pulmonary nodules 03/02/2011  . Cough 03/02/2011  . Long term (current) use of anticoagulants 02/21/2011  . Atrial fibrillation with controlled ventricular response ([place]) 02/17/2011  . Annual physical exam 12/23/2010  . Dyspnea 09/03/2010  . HEMORRHOIDS-INTERNAL 03/08/2010  . [place] INTESTINE 03/08/2010  . RECTAL BLEEDING 03/08/2010  . HEMATOCHEZIA 03/06/2010  . Essential hypertension 02/04/2010  . HYPERTROPHY PROSTATE W/O UR OBST & OTH LUTS 12/10/2009  . Osteopenia 12/07/2009  . PERIPHERAL NEUROPATHY 08/29/2009  . FECAL  INCONTINENCE 01/29/2009  . OSTEOARTHRITIS, ANKLE, RIGHT 11/01/2008  . ERECTILE DYSFUNCTION 12/02/2007  . TREMOR 12/02/2007  . CARCINOMA, SKIN, SQUAMOUS CELL 09/16/2006  . B12 DEFICIENCY 08/27/2006  . HYPERTRIGLYCERIDEMIA 08/27/2006    [place] , [place], [place]  04/19/2015, 2:02 PM  [facility] [address] [place], [place], [address] Phone: [phone]   Fax:  [phone]   Name: Brian [name] MRN: [id number] Date of Birth: November 09, 1928

## 2015-04-19 NOTE — Telephone Encounter (Signed)
Pt wanted you to know that he is going to stop PT. He is doing exercises at home and he and his family thought that he is making more progress there. When it comes time to go to PT, the pt gets very upset about going. They wanted you to know and be okay with that decision.

## 2015-04-23 ENCOUNTER — Telehealth: Payer: Self-pay | Admitting: Internal Medicine

## 2015-04-23 MED ORDER — PRAVASTATIN SODIUM 10 MG PO TABS: 10.0000 mg | ORAL_TABLET | Freq: Every day | ORAL | Status: DC

## 2015-04-23 NOTE — Telephone Encounter (Signed)
Refilled patients medication with #[facility] with 0 rf

## 2015-04-23 NOTE — Telephone Encounter (Signed)
Caller name: Self  Can be reached: Pharmacy:  [facility], [place] [phone] (Phone) [phone] (Fax)         Reason for call: Refill pravastatin (PRAVACHOL) 10 MG tablet [id number]

## 2015-04-23 NOTE — Telephone Encounter (Signed)
Pt's spouse called in to fu on medication. She says that they would need a 90 day supply to go to [facility].

## 2015-04-24 ENCOUNTER — Ambulatory Visit: Payer: Self-pay | Admitting: Physical Therapy

## 2015-04-24 ENCOUNTER — Ambulatory Visit: Payer: Medicare Other

## 2015-04-24 DIAGNOSIS — I69320 Aphasia following cerebral infarction: Secondary | ICD-10-CM | POA: Diagnosis not present

## 2015-04-24 DIAGNOSIS — R269 Unspecified abnormalities of gait and mobility: Secondary | ICD-10-CM | POA: Diagnosis not present

## 2015-04-24 DIAGNOSIS — R1310 Dysphagia, unspecified: Secondary | ICD-10-CM

## 2015-04-24 DIAGNOSIS — R531 Weakness: Secondary | ICD-10-CM | POA: Diagnosis not present

## 2015-04-24 DIAGNOSIS — R2681 Unsteadiness on feet: Secondary | ICD-10-CM | POA: Diagnosis not present

## 2015-04-24 NOTE — Therapy (Signed)
[facility] [address] [place], [place], [address] Phone: [phone]   Fax:  [phone]  Speech Language Pathology Treatment  Patient Details  Name: Brian [name] MRN: [id number] Date of Birth: 12-31-1928 Referring Provider: Rosalin Hawking M.D.  Encounter Date: 04/24/2015      End of Session - 04/24/15 1333    Visit Number 7   Number of Visits 25   Date for SLP Re-Evaluation 06/01/15   SLP Start Time 1147   SLP Stop Time  1230   SLP Time Calculation (min) 43 min   Activity Tolerance Patient tolerated treatment well      Past Medical History  Diagnosis Date  . Muscle tremor     saw neurology remotely elsewhere, was Rx inderal  . Crohn's ileocolitis ([place])   . Osteopenia     per DEXA 11/2008  . Hypertension 11/11    mild  . Hemorrhoids   . Hypertrophy of prostate     w/o UR obst & oth luts  . Peripheral neuropathy (HCC)     h/o neg w/u  . ED (erectile dysfunction)   . Hypertriglyceridemia   . COPD (chronic obstructive pulmonary disease) ([place])     recent dx--no acute problems  . Hiatal hernia 1956    found while in service  . Anxiety   . Atrial fibrillation with RVR ([place])   . Long term (current) use of anticoagulants 02/21/2011  . Serrated adenoma of colon 03/2010  . Renal cyst     bilateral  . Pulmonary fibrosis ([place]) 2015    Rx Esbriet ~ 10-2013 ([name]), Dr. [name]  . On home oxygen therapy     "2L; 20-24h for the past week" (01/26/2015)  . Emphysema lung ([place]) 2016    Dr. [name]  . B12 deficiency anemia   . Idiopathic pulmonary fibrosis ([place]) 2016    Dr. [name]  . Pneumonia ?2015  . CAP (community acquired pneumonia) 01/26/2015  . Osteoarthritis     "maybe in my hands, feet" (01/26/2015)  . Depression   . Squamous cell carcinoma of skin of scalp     tx'd ~ 71yrs; "froze them off"  . Squamous cell carcinoma, face      "froze them off"  . Melanoma of scalp ([place])   . Stroke [facility])     Past  Surgical History  Procedure Laterality Date  . Colon resection  1984    resection of distal ileum and cecum w/ appendectomy, 18-inch small intestine, for Crohn's disease  . Colon surgery    . Appendectomy  1984  . Cataract extraction w/ intraocular lens  implant, bilateral Bilateral 2007  . Cholecystectomy  02/17/2011    Procedure: LAPAROSCOPIC CHOLECYSTECTOMY WITH INTRAOPERATIVE CHOLANGIOGRAM;  Surgeon: Edward Jolly, MD;  Location: [facility];  Service: General;  Laterality: N/A;  . Mohs surgery Right ~ 2014    "side of my scalp"    There were no vitals filed for this visit.  Visit Diagnosis: Dysphagia  Aphasia following cerebral infarction             ADULT SLP TREATMENT - 04/24/15 1218    General Information   Behavior/Cognition Alert;Cooperative;Pleasant mood;Requires cueing   Treatment Provided   Treatment provided Dysphagia   Dysphagia Treatment   Temperature Spikes Noted No   Respiratory Status Nasal cannula   Treatment Methods Skilled observation;Therapeutic exercise   Patient observed directly with PO's Yes   Type of PO's observed Ice chips  popsicle  Pharyngeal Phase Signs & Symptoms Wet vocal quality  consistently after1st swallow after 6 minutes,gone with 2nd   Other treatment/comments SLP used NMES placement 3b at mA=10.5 with POs and with aspects of his HEP to strengthen swallowing  musculature. Pt with wet voice after 6 minutes after 1st swallow, clearing after 2nd swallow. After 18 minutes, wet voice after second swallow cleared after third swalow. Pt req'd SLP cues for wet voice throat clear/reswallow 2/4 times today, between PO boluses.   Assessment / Recommendations / Plan   Plan Continue with current plan of care   Dysphagia Recommendations   Diet recommendations NPO  with ice chips <30 minutes after oral care   Progression Toward Goals   Progression toward goals Progressing toward goals            SLP Short Term Goals - 04/24/15 1335     SLP SHORT TERM GOAL #1   Title pt will demo efforftul swallows 60% of the time in >100 opportunities over 3 sessions   Status Achieved   SLP SHORT TERM GOAL #2   Title pt will throat clear/cough with hydrophonic voice with rare nonverbal cues   Time 1   Period Weeks   Status On-going   SLP SHORT TERM GOAL #3   Title pt will complete HEP for dysphagia with modified independence (demo cues)   Time 1   Period Weeks   Status On-going          SLP Long Term Goals - 04/24/15 1336    SLP LONG TERM GOAL #1   Title pt will demo effortful swallows 75% of the time in >120 opportunities over 3 sessions   Time 5   Period Weeks   Status Revised   SLP LONG TERM GOAL #2   Title pt will complete dysphagia HEP with modified independence over 3 sessions   Time 5   Period Weeks   Status On-going   SLP LONG TERM GOAL #3   Title pt will clear voice/cough with hydrophonic voice independently   Time 5   Period Weeks   Status On-going   SLP LONG TERM GOAL #4   Title pt/wife will indicate 3 s/s aspiration PNA   Time 5   Period Weeks   Status On-going          Plan - 04/24/15 1333    Clinical Impression Statement  Wet voice cont evident, but did not begin until after 6 minutes tx with NMES. Appears as though pt's stamina is increasing. Pt would cont to benefit from skilled ST to cont to maximize swallow function as this is most concerning to pt/family at this time.   Speech Therapy Frequency 3x / week   Duration --  5 weeks   Treatment/Interventions Aspiration precaution training;Pharyngeal strengthening exercises;Diet toleration management by SLP;NMES;Cueing hierarchy;Functional tasks;Patient/family education;Compensatory strategies;SLP instruction and feedback;Language facilitation   Potential to Achieve Goals Fair   Potential Considerations Severity of impairments   Consulted and Agree with Plan of Care Family member/caregiver  wife - healthcare POA        Problem List Patient  Active Problem List   Diagnosis Date Noted  . Cerebral infarction due to embolism of left middle cerebral artery ([place]) 04/18/2015  . Chronic anticoagulation 04/18/2015  . HLD (hyperlipidemia) 04/18/2015  . Persistent atrial fibrillation ([place]) 04/18/2015  . Gait disturbance, post-stroke 03/29/2015  . Atrial fibrillation with RVR ([place]) 03/13/2015  . Dysphagia   . Left middle cerebral artery stroke ([place]) 02/02/2015  .  Dysphagia, pharyngoesophageal phase 02/02/2015  . Aphasia due to recent cerebrovascular accident 02/02/2015  . Bacterial pneumonia   . Stroke with cerebral ischemia ([place])   . Stroke ([place]) 01/31/2015  . CVA (cerebral infarction) 01/31/2015  . Aphasia   . Hypokalemia   . CAP (community acquired pneumonia) 01/26/2015  . Pulmonary fibrosis ([place]) 01/26/2015  . PCP NOTES >>>>>>>>>>>> 12/14/2014  . Vitamin D deficiency 11/04/2013  . Vasomotor rhinitis 09/22/2013  . Diarrhea 07/08/2013  . Crohn's disease ([place]) 07/08/2013  . Postinflammatory pulmonary fibrosis ([place]) 05/26/2013  . Anxiety state 05/20/2013  . Sprain of shoulder, left 06/01/2012  . Personal history of colonic polyps 04/12/2012  . Pulmonary nodules 03/02/2011  . Cough 03/02/2011  . Long term (current) use of anticoagulants 02/21/2011  . Atrial fibrillation with controlled ventricular response ([place]) 02/17/2011  . Annual physical exam 12/23/2010  . Dyspnea 09/03/2010  . HEMORRHOIDS-INTERNAL 03/08/2010  . [place] INTESTINE 03/08/2010  . RECTAL BLEEDING 03/08/2010  . HEMATOCHEZIA 03/06/2010  . Essential hypertension 02/04/2010  . HYPERTROPHY PROSTATE W/O UR OBST & OTH LUTS 12/10/2009  . Osteopenia 12/07/2009  . PERIPHERAL NEUROPATHY 08/29/2009  . FECAL INCONTINENCE 01/29/2009  . OSTEOARTHRITIS, ANKLE, RIGHT 11/01/2008  . ERECTILE DYSFUNCTION 12/02/2007  . TREMOR 12/02/2007  . CARCINOMA, SKIN, SQUAMOUS CELL 09/16/2006  . B12 DEFICIENCY 08/27/2006  . HYPERTRIGLYCERIDEMIA 08/27/2006     SCHINKE,CARL , MS, [place]  04/24/2015, 1:37 PM  [facility] [address] [place], [place], [address] Phone: [phone]   Fax:  [phone]   Name: NAADIR HOKAMA MRN: [id number] Date of Birth: [birth year]

## 2015-04-25 ENCOUNTER — Encounter: Payer: Self-pay | Admitting: Internal Medicine

## 2015-04-25 ENCOUNTER — Ambulatory Visit: Payer: Self-pay | Admitting: Physical Therapy

## 2015-04-25 ENCOUNTER — Ambulatory Visit (INDEPENDENT_AMBULATORY_CARE_PROVIDER_SITE_OTHER): Payer: Medicare Other | Admitting: Internal Medicine

## 2015-04-25 ENCOUNTER — Ambulatory Visit: Payer: Medicare Other | Attending: Neurology

## 2015-04-25 VITALS — BP 118/66 | HR 77 | Temp 98.2°F | Ht 71.0 in | Wt 141.1 lb

## 2015-04-25 DIAGNOSIS — I69320 Aphasia following cerebral infarction: Secondary | ICD-10-CM | POA: Diagnosis not present

## 2015-04-25 DIAGNOSIS — R040 Epistaxis: Secondary | ICD-10-CM | POA: Diagnosis not present

## 2015-04-25 DIAGNOSIS — R1310 Dysphagia, unspecified: Secondary | ICD-10-CM | POA: Insufficient documentation

## 2015-04-25 DIAGNOSIS — I63412 Cerebral infarction due to embolism of left middle cerebral artery: Secondary | ICD-10-CM

## 2015-04-25 NOTE — Patient Instructions (Signed)
Hold Astelin for 10 days  Continue with oxygen  Use plain Vaseline twice a day to keep the nose moist.  If nosebleed, remove the oxygen temporarily, tilt the head backwards, apply pressure.  If symptoms severe or unable to stop the bleeding: Go to the ER  If symptoms persistent call for ENT referral

## 2015-04-25 NOTE — Therapy (Signed)
[facility] [address] [place], [place], [address] Phone: [phone]   Fax:  [phone]  Speech Language Pathology Treatment  Patient Details  Name: Brian [name] MRN: [id number] Date of Birth: 08/27/1928 Referring Provider: Rosalin Hawking M.D.  Encounter Date: 04/25/2015      End of Session - 04/25/15 1304    Visit Number 8   Date for SLP Re-Evaluation 06/01/15   SLP Start Time 1149   SLP Stop Time  1228   SLP Time Calculation (min) 39 min      Past Medical History  Diagnosis Date  . Muscle tremor     saw neurology remotely elsewhere, was Rx inderal  . Crohn's ileocolitis ([place])   . Osteopenia     per DEXA 11/2008  . Hypertension 11/11    mild  . Hemorrhoids   . Hypertrophy of prostate     w/o UR obst & oth luts  . Peripheral neuropathy (HCC)     h/o neg w/u  . ED (erectile dysfunction)   . Hypertriglyceridemia   . COPD (chronic obstructive pulmonary disease) ([place])     recent dx--no acute problems  . Hiatal hernia 1956    found while in service  . Anxiety   . Atrial fibrillation with RVR ([place])   . Long term (current) use of anticoagulants 02/21/2011  . Serrated adenoma of colon 03/2010  . Renal cyst     bilateral  . Pulmonary fibrosis ([place]) 2015    Rx Esbriet ~ 10-2013 ([name]), Dr. [name]  . On home oxygen therapy     "2L; 20-24h for the past week" (01/26/2015)  . Emphysema lung ([place]) 2016    Dr. [name]  . B12 deficiency anemia   . Idiopathic pulmonary fibrosis ([place]) 2016    Dr. [name]  . Pneumonia ?2015  . CAP (community acquired pneumonia) 01/26/2015  . Osteoarthritis     "maybe in my hands, feet" (01/26/2015)  . Depression   . Squamous cell carcinoma of skin of scalp     tx'd ~ 31yrs; "froze them off"  . Squamous cell carcinoma, face      "froze them off"  . Melanoma of scalp ([place])   . Stroke [facility])     Past Surgical History  Procedure Laterality Date  . Colon resection  1984   resection of distal ileum and cecum w/ appendectomy, 18-inch small intestine, for Crohn's disease  . Colon surgery    . Appendectomy  1984  . Cataract extraction w/ intraocular lens  implant, bilateral Bilateral 2007  . Cholecystectomy  02/17/2011    Procedure: LAPAROSCOPIC CHOLECYSTECTOMY WITH INTRAOPERATIVE CHOLANGIOGRAM;  Surgeon: Edward Jolly, MD;  Location: [facility];  Service: General;  Laterality: N/A;  . Mohs surgery Right ~ 2014    "side of my scalp"    There were no vitals filed for this visit.  Visit Diagnosis: Dysphagia  Aphasia following cerebral infarction             ADULT SLP TREATMENT - 04/25/15 1257    General Information   Behavior/Cognition Alert;Cooperative;Pleasant mood;Requires cueing   Treatment Provided   Treatment provided Dysphagia   Dysphagia Treatment   Temperature Spikes Noted No   Respiratory Status Nasal cannula   Treatment Methods Skilled observation;Therapeutic exercise   Patient observed directly with PO's Yes   Type of PO's observed Ice chips  popsicle   Pharyngeal Phase Signs & Symptoms Wet vocal quality   Other treatment/comments NMES was used  today with placement 3a with mA not >10.0.    Pain Assessment   Pain Assessment No/denies pain   Assessment / Recommendations / Plan   Plan Continue with current plan of care   Dysphagia Recommendations   Diet recommendations NPO  ice chips <30 minutes after oral care            SLP Short Term Goals - 04/25/15 1305    SLP SHORT TERM GOAL #1   Title pt will demo efforftul swallows 60% of the time in >100 opportunities over 3 sessions   Status Achieved   SLP SHORT TERM GOAL #2   Title pt will throat clear/cough with hydrophonic voice with rare nonverbal cues   Time 1   Period Weeks   Status On-going   SLP SHORT TERM GOAL #3   Title pt will complete HEP for dysphagia with modified independence (demo cues)   Time 1   Period Weeks   Status On-going          SLP Long  Term Goals - 04/25/15 1306    SLP LONG TERM GOAL #1   Title pt will demo effortful swallows 75% of the time in >120 opportunities over 3 sessions   Time 5   Period Weeks   Status Revised   SLP LONG TERM GOAL #2   Title pt will complete dysphagia HEP with modified independence over 3 sessions   Time 5   Period Weeks   Status On-going   SLP LONG TERM GOAL #3   Title pt will clear voice/cough with hydrophonic voice independently   Time 5   Period Weeks   Status On-going   SLP LONG TERM GOAL #4   Title pt/wife will indicate 3 s/s aspiration PNA   Time 5   Period Weeks   Status On-going          Plan - 04/25/15 1304    Clinical Impression Statement  Wet voice cont evident moreso after 16 minutes and after second swallow, whereas prior to 16 minutes just evident after initial swallow (cleared after second swallow). Pt would cont to benefit from skilled ST to cont to maximize swallow function as this is most concerning to pt/family at this time.   Speech Therapy Frequency 3x / week   Duration --  5 weeks   Treatment/Interventions Aspiration precaution training;Pharyngeal strengthening exercises;Diet toleration management by SLP;NMES;Cueing hierarchy;Functional tasks;Patient/family education;Compensatory strategies;SLP instruction and feedback;Language facilitation   Potential to Achieve Goals Fair   Potential Considerations Severity of impairments        Problem List Patient Active Problem List   Diagnosis Date Noted  . Cerebral infarction due to embolism of left middle cerebral artery ([place]) 04/18/2015  . Chronic anticoagulation 04/18/2015  . HLD (hyperlipidemia) 04/18/2015  . Persistent atrial fibrillation ([place]) 04/18/2015  . Gait disturbance, post-stroke 03/29/2015  . Atrial fibrillation with RVR ([place]) 03/13/2015  . Dysphagia   . Left middle cerebral artery stroke ([place]) 02/02/2015  . Dysphagia, pharyngoesophageal phase 02/02/2015  . Aphasia due to recent cerebrovascular  accident 02/02/2015  . Bacterial pneumonia   . Stroke with cerebral ischemia ([place])   . Stroke ([place]) 01/31/2015  . CVA (cerebral infarction) 01/31/2015  . Aphasia   . Hypokalemia   . CAP (community acquired pneumonia) 01/26/2015  . Pulmonary fibrosis ([place]) 01/26/2015  . PCP NOTES >>>>>>>>>>>> 12/14/2014  . Vitamin D deficiency 11/04/2013  . Vasomotor rhinitis 09/22/2013  . Diarrhea 07/08/2013  . Crohn's disease ([place]) 07/08/2013  . Postinflammatory pulmonary  fibrosis ([place]) 05/26/2013  . Anxiety state 05/20/2013  . Sprain of shoulder, left 06/01/2012  . Personal history of colonic polyps 04/12/2012  . Pulmonary nodules 03/02/2011  . Cough 03/02/2011  . Long term (current) use of anticoagulants 02/21/2011  . Atrial fibrillation with controlled ventricular response ([place]) 02/17/2011  . Annual physical exam 12/23/2010  . Dyspnea 09/03/2010  . HEMORRHOIDS-INTERNAL 03/08/2010  . [place] INTESTINE 03/08/2010  . RECTAL BLEEDING 03/08/2010  . HEMATOCHEZIA 03/06/2010  . Essential hypertension 02/04/2010  . HYPERTROPHY PROSTATE W/O UR OBST & OTH LUTS 12/10/2009  . Osteopenia 12/07/2009  . PERIPHERAL NEUROPATHY 08/29/2009  . FECAL INCONTINENCE 01/29/2009  . OSTEOARTHRITIS, ANKLE, RIGHT 11/01/2008  . ERECTILE DYSFUNCTION 12/02/2007  . TREMOR 12/02/2007  . CARCINOMA, SKIN, SQUAMOUS CELL 09/16/2006  . B12 DEFICIENCY 08/27/2006  . HYPERTRIGLYCERIDEMIA 08/27/2006    [facility] , [place], [place]  04/25/2015, 1:06 PM  [facility] [address] [place], [place], [address] Phone: [phone]   Fax:  [phone]   Name: BRAELYN CORLETT MRN: [id number] Date of Birth: 04/17/1928

## 2015-04-25 NOTE — Progress Notes (Signed)
Pre visit review using our clinic review tool, if applicable. No additional management support is needed unless otherwise documented below in the visit note. 

## 2015-04-25 NOTE — Progress Notes (Signed)
Subjective:    Patient ID: Brian [name], male    DOB: 10/25/1928, 82 y.o.   MRN: [id number]  DOS:  04/25/2015 Type of visit - description : Acute visit, here with his wife Interval history: Has on and off nosebleeds, worse on the left, early in the morning. Otherwise doing okay, undergoing speech therapy. Saw neurology, note reviewed.    Review of Systems Denies blood in the urine, blood in the stools or abdominal pain  Past Medical History  Diagnosis Date  . Muscle tremor     saw neurology remotely elsewhere, was Rx inderal  . Crohn's ileocolitis ([place])   . Osteopenia     per DEXA 11/2008  . Hypertension 11/11    mild  . Hemorrhoids   . Hypertrophy of prostate     w/o UR obst & oth luts  . Peripheral neuropathy (HCC)     h/o neg w/u  . ED (erectile dysfunction)   . Hypertriglyceridemia   . COPD (chronic obstructive pulmonary disease) ([place])     recent dx--no acute problems  . Hiatal hernia 1956    found while in service  . Anxiety   . Atrial fibrillation with RVR ([place])   . Long term (current) use of anticoagulants 02/21/2011  . Serrated adenoma of colon 03/2010  . Renal cyst     bilateral  . Pulmonary fibrosis ([place]) 2015    Rx Esbriet ~ 10-2013 ([name]), Dr. [name]  . On home oxygen therapy     "2L; 20-24h for the past week" (01/26/2015)  . Emphysema lung ([place]) 2016    Dr. [name]  . B12 deficiency anemia   . Idiopathic pulmonary fibrosis ([place]) 2016    Dr. [name]  . Pneumonia ?2015  . CAP (community acquired pneumonia) 01/26/2015  . Osteoarthritis     "maybe in my hands, feet" (01/26/2015)  . Depression   . Squamous cell carcinoma of skin of scalp     tx'd ~ 86yrs; "froze them off"  . Squamous cell carcinoma, face      "froze them off"  . Melanoma of scalp ([place])   . Stroke [facility])     Past Surgical History  Procedure Laterality Date  . Colon resection  1984    resection of distal ileum and cecum w/ appendectomy, 18-inch small intestine, for Crohn's disease    . Colon surgery    . Appendectomy  1984  . Cataract extraction w/ intraocular lens  implant, bilateral Bilateral 2007  . Cholecystectomy  02/17/2011    Procedure: LAPAROSCOPIC CHOLECYSTECTOMY WITH INTRAOPERATIVE CHOLANGIOGRAM;  Surgeon: Edward Jolly, MD;  Location: [facility];  Service: General;  Laterality: N/A;  . Mohs surgery Right ~ 2014    "side of my scalp"    Social History   Social History  . Marital Status: Married    Spouse Name: Dalene Seltzer  . Number of Children: 4  . Years of Education: N/A   Occupational History  . Retired   . RETIRED    Social History Main Topics  . Smoking status: Former Smoker -- 1.00 packs/day for 40 years    Types: Cigarettes, Pipe    Quit date: 03/24/1990  . Smokeless tobacco: Never Used  . Alcohol Use: No     Comment:    . Drug Use: No  . Sexual Activity: No   Other Topics Concern  . Not on file   Social History Narrative   Married, lives w/ wife  Medication List       This list is accurate as of: 04/25/15  2:32 PM.  Always use your most recent med list.               antiseptic oral rinse 0.05 % Liqd solution  Commonly known as:  CPC / CETYLPYRIDINIUM CHLORIDE 0.05%  7 mLs by Mouth Rinse route 2 times daily at 12 noon and 4 pm.     apixaban 5 MG Tabs tablet  Commonly known as:  ELIQUIS  Place 1 tablet (5 mg total) into feeding tube 2 (two) times daily.     azelastine 0.1 % nasal spray  Commonly known as:  ASTELIN  Place 1 spray into both nostrils 2 (two) times daily as needed for allergies. Use in each nostril as directed     busPIRone 15 MG tablet  Commonly known as:  BUSPAR  Place 1 tablet (15 mg total) into feeding tube 2 (two) times daily.     chlorhexidine 0.12 % solution  Commonly known as:  PERIDEX  15 mLs by Mouth Rinse route 2 (two) times daily.     clonazePAM 0.5 MG tablet  Commonly known as:  KLONOPIN  Place 0.5 tablets (0.25 mg total) into feeding tube 3 (three) times daily as needed for  anxiety.     Cyanocobalamin 500 MCG/0.1ML Soln  Commonly known as:  NASCOBAL  Use one spray nasally per week as directed.     famotidine 40 MG tablet  Commonly known as:  PEPCID  Place 1 tablet (40 mg total) into feeding tube daily.     feeding supplement (JEVITY 1.2 CAL) Liqd  Place 340 mLs into feeding tube 5 (five) times daily.     free water Soln  Place 150 mLs into feeding tube 3 (three) times daily. Use filtered water     gemfibrozil 600 MG tablet  Commonly known as:  LOPID  Place 1 tablet (600 mg total) into feeding tube 2 (two) times daily.     loperamide 1 MG/5ML solution  Commonly known as:  IMODIUM  Place 10 mLs (2 mg total) into feeding tube as needed for diarrhea or loose stools.     nebivolol 5 MG tablet  Commonly known as:  BYSTOLIC  Take 1 tablet (5 mg total) by mouth daily.     OXYGEN     PENTASA PO  Give 2 capsules by tube daily.     Pirfenidone 267 MG Caps  Take 3 capsules by mouth 3 (three) times daily. Put in feeding tube     pravastatin 10 MG tablet  Commonly known as:  PRAVACHOL  Place 1 tablet (10 mg total) into feeding tube daily at 6 PM.     Vitamin D (Ergocalciferol) 50000 units Caps capsule  Commonly known as:  DRISDOL  TAKE 1 CAPSULE (50,000 UNITS TOTAL) BY MOUTH ONCE A WEEK.           Objective:   Physical Exam BP 118/66 mmHg  Pulse 77  Temp(Src) 98.2 F (36.8 C) (Oral)  Ht 5\' 11"  (1.803 m)  Wt 141 lb 2 oz (64.014 kg)  BMI 19.69 kg/m2  SpO2 93% General:   Well developed, well nourished . NAD.  HEENT:  Normocephalic . Face symmetric, atraumatic. Nostrils: Has a very small clot at the medial septum on the left, some erythema of the septum on the right. No active bleeding Skin: Not pale. Not jaundice Neurologic:  alert , assisted by a walker. Motor symmetric. Psych--  Seem  in very good spirits. No anxious or depressed appearing.      Assessment & Plan:   Assessment > HTN Hypertriglyceridemia Anxiety, on chronic  Buspar , sx controlled  Depression 01-2015 after lost a brother  Atrial fibrillation anticoagulated CVA 01-2015: Dysphagia, aphasia, R weakness, has a PANDA   Pulmonary: --Pulmonary fibrosis, home oxygen,   OV @ [name] 12-2014, stable  --pneumonia 01-2015: O2 increased to 4 lt 01-2015 (temporarily?) --Emphysema Tremors Crohn's colitis DJD Osteopenia DEXA 2010, DEXA 03-2014 T score -2.2, on calcium and vitamin D BPH Peripheral  neuropathy previous workup negative Skin cancer SCC B12 deficiency -- nascobal  H/o palpable abdominal aorta --- CT of the abdomen 06/2013 no AAA  Plan: Epistaxis: Likely related to being anticoagulated, irritation from oxygen prongs and excessive dryness. Recommend to hold Astelin, use Vaseline, if symptoms severe-persistent: let me know. See instructions. Pneumonia: Recently chest x-ray abnormal, see report, to see doctors at [facility] this week. RTC 4 months as   previously recommended

## 2015-04-26 ENCOUNTER — Ambulatory Visit: Payer: Medicare Other

## 2015-04-26 DIAGNOSIS — R1310 Dysphagia, unspecified: Secondary | ICD-10-CM

## 2015-04-26 DIAGNOSIS — I69320 Aphasia following cerebral infarction: Secondary | ICD-10-CM

## 2015-04-26 NOTE — Therapy (Signed)
[facility] [address] [place], [place], [address] Phone: [phone]   Fax:  [phone]  Speech Language Pathology Treatment  Patient Details  Name: Brian [name] MRN: [id number] Date of Birth: 08-May-1928 Referring Provider: Rosalin Hawking M.D.  Encounter Date: 04/26/2015      End of Session - 04/26/15 1411    Visit Number 9   Number of Visits 25   Date for SLP Re-Evaluation 06/01/15   SLP Start Time 1318   SLP Stop Time  1400   SLP Time Calculation (min) 42 min   Activity Tolerance Patient tolerated treatment well      Past Medical History  Diagnosis Date  . Muscle tremor     saw neurology remotely elsewhere, was Rx inderal  . Crohn's ileocolitis ([place])   . Osteopenia     per DEXA 11/2008  . Hypertension 11/11    mild  . Hemorrhoids   . Hypertrophy of prostate     w/o UR obst & oth luts  . Peripheral neuropathy (HCC)     h/o neg w/u  . ED (erectile dysfunction)   . Hypertriglyceridemia   . COPD (chronic obstructive pulmonary disease) ([place])     recent dx--no acute problems  . Hiatal hernia 1956    found while in service  . Anxiety   . Atrial fibrillation with RVR ([place])   . Long term (current) use of anticoagulants 02/21/2011  . Serrated adenoma of colon 03/2010  . Renal cyst     bilateral  . Pulmonary fibrosis ([place]) 2015    Rx Esbriet ~ 10-2013 ([name]), Dr. [name]  . On home oxygen therapy     "2L; 20-24h for the past week" (01/26/2015)  . Emphysema lung ([place]) 2016    Dr. [name]  . B12 deficiency anemia   . Idiopathic pulmonary fibrosis ([place]) 2016    Dr. [name]  . Pneumonia ?2015  . CAP (community acquired pneumonia) 01/26/2015  . Osteoarthritis     "maybe in my hands, feet" (01/26/2015)  . Depression   . Squamous cell carcinoma of skin of scalp     tx'd ~ 1yrs; "froze them off"  . Squamous cell carcinoma, face      "froze them off"  . Melanoma of scalp ([place])   . Stroke [facility])     Past  Surgical History  Procedure Laterality Date  . Colon resection  1984    resection of distal ileum and cecum w/ appendectomy, 18-inch small intestine, for Crohn's disease  . Colon surgery    . Appendectomy  1984  . Cataract extraction w/ intraocular lens  implant, bilateral Bilateral 2007  . Cholecystectomy  02/17/2011    Procedure: LAPAROSCOPIC CHOLECYSTECTOMY WITH INTRAOPERATIVE CHOLANGIOGRAM;  Surgeon: Edward Jolly, MD;  Location: [facility];  Service: General;  Laterality: N/A;  . Mohs surgery Right ~ 2014    "side of my scalp"    There were no vitals filed for this visit.  Visit Diagnosis: Dysphagia  Aphasia following cerebral infarction      Subjective Assessment - 04/26/15 1334    Subjective Pt had oral care <30 minutes prior to therapy time today.   Patient is accompained by: Family member  wife, jean               ADULT SLP TREATMENT - 04/26/15 1335    General Information   Behavior/Cognition Alert;Cooperative;Pleasant mood;Requires cueing   Treatment Provided   Treatment provided Dysphagia   Dysphagia Treatment  Temperature Spikes Noted No   Respiratory Status Nasal cannula   Treatment Methods Skilled observation;Therapeutic exercise   Patient observed directly with PO's Yes   Type of PO's observed Ice chips  popsicle   Pharyngeal Phase Signs & Symptoms Wet vocal quality  possibly due to post nasal drip   Other treatment/comments Pt's swallow musculature was strengthened by using aspects of pt's HEP with effortful swallows with ice chips and popsicle using NMES  placement 3a and mA=10.5. Pt with consistent wet voice after first swallow and second swallow today, uncommon - up to today pt has been hydrophonic only after first swallow, to start, then after a time the second swallow as well. (post-nasal drip)Pt spontaneously reswallowed with hydrophonic voice 85% of the time, sometimes with 6-7 second delay. Pt with with tongue pumping after 16 minutes. With  [place] at the end of session, pt with delay of 5-7 seconds with each rep, and tongue pumping noted consistently.    Assessment / Recommendations / Plan   Plan Continue with current plan of care   Dysphagia Recommendations   Diet recommendations NPO  ice chips <30 minutes following oral care   Progression Toward Goals   Progression toward goals Progressing toward goals            SLP Short Term Goals - 04/26/15 1413    SLP SHORT TERM GOAL #1   Title pt will demo efforftul swallows 60% of the time in >100 opportunities over 3 sessions   Baseline three sesions 04-26-15   Status Achieved   SLP SHORT TERM GOAL #2   Title pt will throat clear/cough with hydrophonic voice with rare nonverbal cues   Status Achieved   SLP SHORT TERM GOAL #3   Title pt will complete HEP for dysphagia with modified independence (demo cues)   Time 1   Period Weeks   Status On-going          SLP Long Term Goals - 04/26/15 1413    SLP LONG TERM GOAL #1   Title pt will demo effortful swallows 75% of the time in >120 opportunities over 3 sessions   Baseline one session 04-26-15   Time 5   Period Weeks   Status Revised   SLP LONG TERM GOAL #2   Title pt will complete dysphagia HEP with modified independence over 3 sessions   Time 5   Period Weeks   Status On-going   SLP LONG TERM GOAL #3   Title pt will clear voice/cough with hydrophonic voice independently   Time 5   Period Weeks   Status On-going   SLP LONG TERM GOAL #4   Title pt/wife will indicate 3 s/s aspiration PNA   Time 5   Period Weeks   Status On-going          Plan - 04/26/15 1412    Clinical Impression Statement  Wet voice cont evident after second swallow early in session, likely due to post-nasal drip from nasal canula. Pt would cont to benefit from skilled ST to cont to maximize swallow function as this is most concerning to pt/family at this time.   Speech Therapy Frequency 3x / week   Duration --  5 weeks    Treatment/Interventions Aspiration precaution training;Pharyngeal strengthening exercises;Diet toleration management by SLP;NMES;Cueing hierarchy;Functional tasks;Patient/family education;Compensatory strategies;SLP instruction and feedback;Language facilitation   Potential to Achieve Goals Fair   Potential Considerations Severity of impairments        Problem List Patient Active Problem List  Diagnosis Date Noted  . Cerebral infarction due to embolism of left middle cerebral artery ([place]) 04/18/2015  . Chronic anticoagulation 04/18/2015  . HLD (hyperlipidemia) 04/18/2015  . Persistent atrial fibrillation ([place]) 04/18/2015  . Gait disturbance, post-stroke 03/29/2015  . Atrial fibrillation with RVR ([place]) 03/13/2015  . Dysphagia   . Left middle cerebral artery stroke ([place]) 02/02/2015  . Dysphagia, pharyngoesophageal phase 02/02/2015  . Aphasia due to recent cerebrovascular accident 02/02/2015  . Bacterial pneumonia   . Stroke with cerebral ischemia ([place])   . Stroke ([place]) 01/31/2015  . CVA (cerebral infarction) 01/31/2015  . Aphasia   . Hypokalemia   . CAP (community acquired pneumonia) 01/26/2015  . Pulmonary fibrosis ([place]) 01/26/2015  . PCP NOTES >>>>>>>>>>>> 12/14/2014  . Vitamin D deficiency 11/04/2013  . Vasomotor rhinitis 09/22/2013  . Diarrhea 07/08/2013  . Crohn's disease ([place]) 07/08/2013  . Postinflammatory pulmonary fibrosis ([place]) 05/26/2013  . Anxiety state 05/20/2013  . Sprain of shoulder, left 06/01/2012  . Personal history of colonic polyps 04/12/2012  . Pulmonary nodules 03/02/2011  . Cough 03/02/2011  . Long term (current) use of anticoagulants 02/21/2011  . Atrial fibrillation with controlled ventricular response ([place]) 02/17/2011  . Annual physical exam 12/23/2010  . Dyspnea 09/03/2010  . HEMORRHOIDS-INTERNAL 03/08/2010  . [place] INTESTINE 03/08/2010  . RECTAL BLEEDING 03/08/2010  . HEMATOCHEZIA 03/06/2010  . Essential hypertension  02/04/2010  . HYPERTROPHY PROSTATE W/O UR OBST & OTH LUTS 12/10/2009  . Osteopenia 12/07/2009  . PERIPHERAL NEUROPATHY 08/29/2009  . FECAL INCONTINENCE 01/29/2009  . OSTEOARTHRITIS, ANKLE, RIGHT 11/01/2008  . ERECTILE DYSFUNCTION 12/02/2007  . TREMOR 12/02/2007  . CARCINOMA, SKIN, SQUAMOUS CELL 09/16/2006  . B12 DEFICIENCY 08/27/2006  . HYPERTRIGLYCERIDEMIA 08/27/2006    [facility] , [place], [place]  04/26/2015, 2:15 PM  [facility] [address] [place], [place], [address] Phone: [phone]   Fax:  [phone]   Name: Brian [name] MRN: [id number] Date of Birth: 04/02/1928

## 2015-04-27 DIAGNOSIS — J84112 Idiopathic pulmonary fibrosis: Secondary | ICD-10-CM | POA: Diagnosis not present

## 2015-05-01 ENCOUNTER — Ambulatory Visit: Payer: Medicare Other

## 2015-05-01 DIAGNOSIS — R1310 Dysphagia, unspecified: Secondary | ICD-10-CM

## 2015-05-01 DIAGNOSIS — I69320 Aphasia following cerebral infarction: Secondary | ICD-10-CM

## 2015-05-01 NOTE — Therapy (Signed)
[facility] [address] [place], [place], [address] Phone: [phone]   Fax:  [phone]  Speech Language Pathology Treatment  Patient Details  Name: Brian [name] MRN: [id number] Date of Birth: 1928/07/24 Referring Provider: Rosalin Hawking M.D.  Encounter Date: 05/01/2015      End of Session - 05/01/15 1652    Visit Number 10   Number of Visits 25   Date for SLP Re-Evaluation 06/01/15   SLP Start Time 1148   SLP Stop Time  1230   SLP Time Calculation (min) 42 min   Activity Tolerance Patient tolerated treatment well      Past Medical History  Diagnosis Date  . Muscle tremor     saw neurology remotely elsewhere, was Rx inderal  . Crohn's ileocolitis ([place])   . Osteopenia     per DEXA 11/2008  . Hypertension 11/11    mild  . Hemorrhoids   . Hypertrophy of prostate     w/o UR obst & oth luts  . Peripheral neuropathy (HCC)     h/o neg w/u  . ED (erectile dysfunction)   . Hypertriglyceridemia   . COPD (chronic obstructive pulmonary disease) ([place])     recent dx--no acute problems  . Hiatal hernia 1956    found while in service  . Anxiety   . Atrial fibrillation with RVR ([place])   . Long term (current) use of anticoagulants 02/21/2011  . Serrated adenoma of colon 03/2010  . Renal cyst     bilateral  . Pulmonary fibrosis ([place]) 2015    Rx Esbriet ~ 10-2013 ([name]), Dr. [name]  . On home oxygen therapy     "2L; 20-24h for the past week" (01/26/2015)  . Emphysema lung ([place]) 2016    Dr. [name]  . B12 deficiency anemia   . Idiopathic pulmonary fibrosis ([place]) 2016    Dr. [name]  . Pneumonia ?2015  . CAP (community acquired pneumonia) 01/26/2015  . Osteoarthritis     "maybe in my hands, feet" (01/26/2015)  . Depression   . Squamous cell carcinoma of skin of scalp     tx'd ~ 29yrs; "froze them off"  . Squamous cell carcinoma, face      "froze them off"  . Melanoma of scalp ([place])   . Stroke [facility])     Past  Surgical History  Procedure Laterality Date  . Colon resection  1984    resection of distal ileum and cecum w/ appendectomy, 18-inch small intestine, for Crohn's disease  . Colon surgery    . Appendectomy  1984  . Cataract extraction w/ intraocular lens  implant, bilateral Bilateral 2007  . Cholecystectomy  02/17/2011    Procedure: LAPAROSCOPIC CHOLECYSTECTOMY WITH INTRAOPERATIVE CHOLANGIOGRAM;  Surgeon: Edward Jolly, MD;  Location: [facility];  Service: General;  Laterality: N/A;  . Mohs surgery Right ~ 2014    "side of my scalp"    There were no vitals filed for this visit.  Visit Diagnosis: Dysphagia  Aphasia following cerebral infarction      Subjective Assessment - 05/01/15 1201    Subjective Pt had oral care <30 minutes prior to therapy time today.   Patient is accompained by: Family member               ADULT SLP TREATMENT - 05/01/15 1201    General Information   Behavior/Cognition Alert;Cooperative;Pleasant mood;Requires cueing   Treatment Provided   Treatment provided Dysphagia   Dysphagia Treatment   Temperature  Spikes Noted No   Respiratory Status Nasal cannula   Treatment Methods Skilled observation;Therapeutic exercise   Patient observed directly with PO's Yes   Type of PO's observed Ice chips  popsicle   Pharyngeal Phase Signs & Symptoms Wet vocal quality   Other treatment/comments Placement 3b was used today with mA 10.0 to strengthen swalowing muscles to improve pt's ability for PO diet. 20 minutes into session SLP noted tongue pumping on second swallow of 3 per ice chip/bolus of popsicle. Consistently, pt withhydrophonic voice after initial swallows and 15 minutes into session rare wet voice after second swallow clearing with third swallow. As session progressed, 2nd swallow was "wet" occasionally. Pt cleared throat spontaneously with wet voice between swallows 90% of the time.   Assessment / Recommendations / Plan   Plan Continue with current plan  of care   Dysphagia Recommendations   Diet recommendations NPO  ice chips <30 minutes folloiwng oral care   Progression Toward Goals   Progression toward goals Progressing toward goals            SLP Short Term Goals - 05/01/15 1654    SLP SHORT TERM GOAL #1   Title pt will demo efforftul swallows 60% of the time in >100 opportunities over 3 sessions   Baseline three sesions 04-26-15   Status Achieved   SLP SHORT TERM GOAL #2   Title pt will throat clear/cough with hydrophonic voice with rare nonverbal cues   Status Achieved   SLP SHORT TERM GOAL #3   Title pt will complete HEP for dysphagia with modified independence (demo cues)   Status Achieved          SLP Long Term Goals - 05/01/15 1654    SLP LONG TERM GOAL #1   Title pt will demo effortful swallows 75% of the time in >120 opportunities over 3 sessions   Baseline two sessions 05-01-15   Time 4   Period Weeks   Status On-going   SLP LONG TERM GOAL #2   Title pt will complete dysphagia HEP with modified independence over 3 sessions   Time 4   Period Weeks   Status On-going   SLP LONG TERM GOAL #3   Title pt will clear voice/cough with hydrophonic voice independently 90% of the time   Time 4   Period Weeks   Status Revised   SLP LONG TERM GOAL #4   Title pt/wife will indicate 3 s/s aspiration PNA   Time 4   Period Weeks   Status On-going          Plan - 05/01/15 1652    Clinical Impression Statement Wet voice, as mostly, after initial swallow and cleared after second swallow.  Wet voice was evident after second swallow as session progressed today. Pt cont to demo reduced swallow strength and would cont to benefit from skilled ST to cont to maximize swallow function as this is most concerning to pt/family at this time.   Speech Therapy Frequency 3x / week   Duration 4 weeks   Treatment/Interventions Aspiration precaution training;Pharyngeal strengthening exercises;Diet toleration management by SLP;NMES;Cueing  hierarchy;Functional tasks;Patient/family education;Compensatory strategies;SLP instruction and feedback;Language facilitation   Potential to Achieve Goals Fair   Potential Considerations Severity of impairments   Consulted and Agree with Plan of Care Family member/caregiver     Speech Therapy Progress Note  Dates of Reporting Period: 04-03-15 to present  Subjective Statement: Pt has been seen for 10 sessions targeting swallowing skills using ice chips  and popsicle bites. NMES for swallowing has been used.  Objective Measurements: Pt's swallowing skills cont to demo weakness as pt exhibits tongue pumping <25 minutes into session, and exhibits wet voice following initial swallow consistently. Pt had [facility] and NP reported that pt's scan did not ID signs of PNA.  Goal Update: see above goal summary  Plan: See "plan" above.  Reason Skilled Services are Required: Pt cont to exhibit reduced swallow strength and is not yet safe for regular PO intake.        G-Codes - 23-May-2015 1656    Functional Assessment Tool Used modfied barium swallow exam report   Functional Limitations Swallowing   Swallow Current Status [id number]) At least 80 percent but less than 100 percent impaired, limited or restricted   Swallow Goal Status [id number]) At least 60 percent but less than 80 percent impaired, limited or restricted      Problem List Patient Active Problem List   Diagnosis Date Noted  . Cerebral infarction due to embolism of left middle cerebral artery ([place]) 04/18/2015  . Chronic anticoagulation 04/18/2015  . HLD (hyperlipidemia) 04/18/2015  . Persistent atrial fibrillation ([place]) 04/18/2015  . Gait disturbance, post-stroke 03/29/2015  . Atrial fibrillation with RVR ([place]) 03/13/2015  . Dysphagia   . Left middle cerebral artery stroke ([place]) 02/02/2015  . Dysphagia, pharyngoesophageal phase 02/02/2015  . Aphasia due to recent cerebrovascular accident 02/02/2015  . Bacterial  pneumonia   . Stroke with cerebral ischemia ([place])   . Stroke ([place]) 01/31/2015  . CVA (cerebral infarction) 01/31/2015  . Aphasia   . Hypokalemia   . CAP (community acquired pneumonia) 01/26/2015  . Pulmonary fibrosis ([place]) 01/26/2015  . PCP NOTES >>>>>>>>>>>> 12/14/2014  . Vitamin D deficiency 11/04/2013  . Vasomotor rhinitis 09/22/2013  . Diarrhea 07/08/2013  . Crohn's disease ([place]) 07/08/2013  . Postinflammatory pulmonary fibrosis ([place]) 05/26/2013  . Anxiety state 05/20/2013  . Sprain of shoulder, left 06/01/2012  . Personal history of colonic polyps 04/12/2012  . Pulmonary nodules 03/02/2011  . Cough 03/02/2011  . Long term (current) use of anticoagulants 02/21/2011  . Atrial fibrillation with controlled ventricular response ([place]) 02/17/2011  . Annual physical exam 12/23/2010  . Dyspnea 09/03/2010  . HEMORRHOIDS-INTERNAL 03/08/2010  . [place] INTESTINE 03/08/2010  . RECTAL BLEEDING 03/08/2010  . HEMATOCHEZIA 03/06/2010  . Essential hypertension 02/04/2010  . HYPERTROPHY PROSTATE W/O UR OBST & OTH LUTS 12/10/2009  . Osteopenia 12/07/2009  . PERIPHERAL NEUROPATHY 08/29/2009  . FECAL INCONTINENCE 01/29/2009  . OSTEOARTHRITIS, ANKLE, RIGHT 11/01/2008  . ERECTILE DYSFUNCTION 12/02/2007  . TREMOR 12/02/2007  . CARCINOMA, SKIN, SQUAMOUS CELL 09/16/2006  . B12 DEFICIENCY 08/27/2006  . HYPERTRIGLYCERIDEMIA 08/27/2006    [facility] , [place], [place]   05/23/15, 4:57 PM  [facility] [address] [place], [place], [address] Phone: [phone]   Fax:  [phone]   Name: DAMONDRE CHILDREE MRN: [id number] Date of Birth: 1929/02/08

## 2015-05-02 ENCOUNTER — Ambulatory Visit: Payer: Medicare Other

## 2015-05-02 ENCOUNTER — Ambulatory Visit: Payer: Self-pay | Admitting: Physical Therapy

## 2015-05-02 DIAGNOSIS — I69320 Aphasia following cerebral infarction: Secondary | ICD-10-CM

## 2015-05-02 DIAGNOSIS — R1310 Dysphagia, unspecified: Secondary | ICD-10-CM

## 2015-05-03 NOTE — Therapy (Signed)
[facility] [address] [place], [place], [address] Phone: [phone]   Fax:  [phone]  Speech Language Pathology Treatment  Patient Details  Name: Brian [name] MRN: [id number] Date of Birth: [birth year] Referring Provider: Rosalin Hawking M.D.  Encounter Date: 05/02/2015      End of Session - 05/02/15 1237    Visit Number 11   Number of Visits 25   Date for SLP Re-Evaluation 06/01/15   SLP Start Time [id number]   SLP Stop Time  1400   SLP Time Calculation (min) 43 min   Activity Tolerance Patient tolerated treatment well      Past Medical History  Diagnosis Date  . Muscle tremor     saw neurology remotely elsewhere, was Rx inderal  . Crohn's ileocolitis ([place])   . Osteopenia     per DEXA 11/2008  . Hypertension 11/11    mild  . Hemorrhoids   . Hypertrophy of prostate     w/o UR obst & oth luts  . Peripheral neuropathy (HCC)     h/o neg w/u  . ED (erectile dysfunction)   . Hypertriglyceridemia   . COPD (chronic obstructive pulmonary disease) ([place])     recent dx--no acute problems  . Hiatal hernia 1956    found while in service  . Anxiety   . Atrial fibrillation with RVR ([place])   . Long term (current) use of anticoagulants 02/21/2011  . Serrated adenoma of colon 03/2010  . Renal cyst     bilateral  . Pulmonary fibrosis ([place]) 2015    Rx Esbriet ~ 10-2013 ([name]), Dr. [name]  . On home oxygen therapy     "2L; 20-24h for the past week" (01/26/2015)  . Emphysema lung ([place]) 2016    Dr. [name]  . B12 deficiency anemia   . Idiopathic pulmonary fibrosis ([place]) 2016    Dr. [name]  . Pneumonia ?2015  . CAP (community acquired pneumonia) 01/26/2015  . Osteoarthritis     "maybe in my hands, feet" (01/26/2015)  . Depression   . Squamous cell carcinoma of skin of scalp     tx'd ~ 44yrs; "froze them off"  . Squamous cell carcinoma, face      "froze them off"  . Melanoma of scalp ([place])   . Stroke [facility])     Past  Surgical History  Procedure Laterality Date  . Colon resection  1984    resection of distal ileum and cecum w/ appendectomy, 18-inch small intestine, for Crohn's disease  . Colon surgery    . Appendectomy  1984  . Cataract extraction w/ intraocular lens  implant, bilateral Bilateral 2007  . Cholecystectomy  02/17/2011    Procedure: LAPAROSCOPIC CHOLECYSTECTOMY WITH INTRAOPERATIVE CHOLANGIOGRAM;  Surgeon: Edward Jolly, MD;  Location: [facility];  Service: General;  Laterality: N/A;  . Mohs surgery Right ~ 2014    "side of my scalp"    There were no vitals filed for this visit.  Visit Diagnosis: Dysphagia  Aphasia following cerebral infarction      Subjective Assessment - 05/02/15 1235    Subjective Pt had oral care <30 minutes prior to therapy time today.               ADULT SLP TREATMENT - 05/02/15 1235    General Information   Behavior/Cognition Alert;Cooperative;Pleasant mood;Requires cueing   Treatment Provided   Treatment provided Dysphagia   Dysphagia Treatment   Temperature Spikes Noted No   Respiratory Status Nasal  cannula   Treatment Methods Skilled observation;Therapeutic exercise   Patient observed directly with PO's Yes   Type of PO's observed Ice chips  popsicle   Pharyngeal Phase Signs & Symptoms Wet vocal quality  consistently after first swallow clearing after second swall   Other treatment/comments Pt req'd cues 10-20% of the time for clearing hydrophonic voice. NMES was used for increasing pt's swallow strength today in order to provide pt opportunity for PO diet. Placement 3a with mA not greater than 10.5 was used. As above, consistent wet voice after first swallow clearing with second swallow. Tongue pumping noted after 12 minutes, occasionally. After 18 minutes pt's voice was hydrophonic after second swallow rarely, with clearing after third swallow.  After 24 minutes second swallow was hydrophonic occasionally, and elapsed time of thyroid  elevation appeared to decr slightly.   Pain Assessment   Pain Assessment No/denies pain   Assessment / Recommendations / Plan   Plan Continue with current plan of care   Dysphagia Recommendations   Diet recommendations NPO  ice chips <30 minutes after oral care   Progression Toward Goals   Progression toward goals Progressing toward goals            SLP Short Term Goals - 05/01/15 1654    SLP SHORT TERM GOAL #1   Title pt will demo efforftul swallows 60% of the time in >100 opportunities over 3 sessions   Baseline three sesions 04-26-15   Status Achieved   SLP SHORT TERM GOAL #2   Title pt will throat clear/cough with hydrophonic voice with rare nonverbal cues   Status Achieved   SLP SHORT TERM GOAL #3   Title pt will complete HEP for dysphagia with modified independence (demo cues)   Status Achieved          SLP Long Term Goals - 05/02/15 1238    SLP LONG TERM GOAL #1   Title pt will demo effortful swallows 75% of the time in >120 opportunities over 3 sessions   Status Achieved   SLP LONG TERM GOAL #2   Title pt will complete dysphagia HEP with modified independence over 3 sessions   Time 4   Period Weeks   Status On-going   SLP LONG TERM GOAL #3   Title pt will clear voice/cough with hydrophonic voice independently 90% of the time   Time 4   Period Weeks   Status On-going   SLP LONG TERM GOAL #4   Title pt/wife will indicate 3 s/s aspiration PNA   Time 4   Period Weeks   Status On-going          Plan - 05/02/15 1237    Clinical Impression Statement Wet voice, as mostly, after initial swallow and cleared after second swallow.  Wet voice was evident after second swallow as session progressed today. Pt cont to demo reduced swallow strength and would cont to benefit from skilled ST to cont to maximize swallow function as this is most concerning to pt/family at this time.   Speech Therapy Frequency 3x / week   Duration 4 weeks   Treatment/Interventions  Aspiration precaution training;Pharyngeal strengthening exercises;Diet toleration management by SLP;NMES;Cueing hierarchy;Functional tasks;Patient/family education;Compensatory strategies;SLP instruction and feedback;Language facilitation   Potential to Achieve Goals Fair   Potential Considerations Severity of impairments   Consulted and Agree with Plan of Care Family member/caregiver        Problem List Patient Active Problem List   Diagnosis Date Noted  . Cerebral infarction  due to embolism of left middle cerebral artery ([place]) 04/18/2015  . Chronic anticoagulation 04/18/2015  . HLD (hyperlipidemia) 04/18/2015  . Persistent atrial fibrillation ([place]) 04/18/2015  . Gait disturbance, post-stroke 03/29/2015  . Atrial fibrillation with RVR ([place]) 03/13/2015  . Dysphagia   . Left middle cerebral artery stroke ([place]) 02/02/2015  . Dysphagia, pharyngoesophageal phase 02/02/2015  . Aphasia due to recent cerebrovascular accident 02/02/2015  . Bacterial pneumonia   . Stroke with cerebral ischemia ([place])   . Stroke ([place]) 01/31/2015  . CVA (cerebral infarction) 01/31/2015  . Aphasia   . Hypokalemia   . CAP (community acquired pneumonia) 01/26/2015  . Pulmonary fibrosis ([place]) 01/26/2015  . PCP NOTES >>>>>>>>>>>> 12/14/2014  . Vitamin D deficiency 11/04/2013  . Vasomotor rhinitis 09/22/2013  . Diarrhea 07/08/2013  . Crohn's disease ([place]) 07/08/2013  . Postinflammatory pulmonary fibrosis ([place]) 05/26/2013  . Anxiety state 05/20/2013  . Sprain of shoulder, left 06/01/2012  . Personal history of colonic polyps 04/12/2012  . Pulmonary nodules 03/02/2011  . Cough 03/02/2011  . Long term (current) use of anticoagulants 02/21/2011  . Atrial fibrillation with controlled ventricular response ([place]) 02/17/2011  . Annual physical exam 12/23/2010  . Dyspnea 09/03/2010  . HEMORRHOIDS-INTERNAL 03/08/2010  . [place] INTESTINE 03/08/2010  . RECTAL BLEEDING 03/08/2010  .  HEMATOCHEZIA 03/06/2010  . Essential hypertension 02/04/2010  . HYPERTROPHY PROSTATE W/O UR OBST & OTH LUTS 12/10/2009  . Osteopenia 12/07/2009  . PERIPHERAL NEUROPATHY 08/29/2009  . FECAL INCONTINENCE 01/29/2009  . OSTEOARTHRITIS, ANKLE, RIGHT 11/01/2008  . ERECTILE DYSFUNCTION 12/02/2007  . TREMOR 12/02/2007  . CARCINOMA, SKIN, SQUAMOUS CELL 09/16/2006  . B12 DEFICIENCY 08/27/2006  . HYPERTRIGLYCERIDEMIA 08/27/2006    [place] ,[place], [place]  05/03/2015, 12:40 PM  [facility] [address] [place], [place], [address] Phone: [phone]   Fax:  [phone]   Name: Brian [name] MRN: [id number] Date of Birth: 1928-05-30

## 2015-05-04 ENCOUNTER — Ambulatory Visit: Payer: Medicare Other

## 2015-05-04 ENCOUNTER — Ambulatory Visit: Payer: Self-pay | Admitting: Physical Therapy

## 2015-05-04 DIAGNOSIS — I69320 Aphasia following cerebral infarction: Secondary | ICD-10-CM

## 2015-05-04 DIAGNOSIS — R1310 Dysphagia, unspecified: Secondary | ICD-10-CM

## 2015-05-04 NOTE — Therapy (Signed)
[facility] [address] [place], [place], [address] Phone: [phone]   Fax:  [phone]  Speech Language Pathology Treatment  Patient Details  Name: Brian [name] MRN: [id number] Date of Birth: 1928-05-05 Referring Provider: Rosalin Hawking M.D.  Encounter Date: 05/04/2015      End of Session - 05/04/15 1402    Visit Number 12   Number of Visits 25   Date for SLP Re-Evaluation 06/01/15   SLP Start Time [id number]   SLP Stop Time  1400   SLP Time Calculation (min) 43 min   Activity Tolerance Patient tolerated treatment well      Past Medical History  Diagnosis Date  . Muscle tremor     saw neurology remotely elsewhere, was Rx inderal  . Crohn's ileocolitis ([place])   . Osteopenia     per DEXA 11/2008  . Hypertension 11/11    mild  . Hemorrhoids   . Hypertrophy of prostate     w/o UR obst & oth luts  . Peripheral neuropathy (HCC)     h/o neg w/u  . ED (erectile dysfunction)   . Hypertriglyceridemia   . COPD (chronic obstructive pulmonary disease) ([place])     recent dx--no acute problems  . Hiatal hernia 1956    found while in service  . Anxiety   . Atrial fibrillation with RVR ([place])   . Long term (current) use of anticoagulants 02/21/2011  . Serrated adenoma of colon 03/2010  . Renal cyst     bilateral  . Pulmonary fibrosis ([place]) 2015    Rx Esbriet ~ 10-2013 ([name]), Dr. [name]  . On home oxygen therapy     "2L; 20-24h for the past week" (01/26/2015)  . Emphysema lung ([place]) 2016    Dr. [name]  . B12 deficiency anemia   . Idiopathic pulmonary fibrosis ([place]) 2016    Dr. [name]  . Pneumonia ?2015  . CAP (community acquired pneumonia) 01/26/2015  . Osteoarthritis     "maybe in my hands, feet" (01/26/2015)  . Depression   . Squamous cell carcinoma of skin of scalp     tx'd ~ 84yrs; "froze them off"  . Squamous cell carcinoma, face      "froze them off"  . Melanoma of scalp ([place])   . Stroke [facility])     Past  Surgical History  Procedure Laterality Date  . Colon resection  1984    resection of distal ileum and cecum w/ appendectomy, 18-inch small intestine, for Crohn's disease  . Colon surgery    . Appendectomy  1984  . Cataract extraction w/ intraocular lens  implant, bilateral Bilateral 2007  . Cholecystectomy  02/17/2011    Procedure: LAPAROSCOPIC CHOLECYSTECTOMY WITH INTRAOPERATIVE CHOLANGIOGRAM;  Surgeon: Edward Jolly, MD;  Location: [facility];  Service: General;  Laterality: N/A;  . Mohs surgery Right ~ 2014    "side of my scalp"    There were no vitals filed for this visit.  Visit Diagnosis: Dysphagia  Aphasia following cerebral infarction             ADULT SLP TREATMENT - 05/04/15 1324    General Information   Behavior/Cognition Alert;Cooperative;Pleasant mood;Requires cueing   Treatment Provided   Treatment provided Dysphagia   Dysphagia Treatment   Temperature Spikes Noted No   Respiratory Status Nasal cannula   Treatment Methods Skilled observation;Therapeutic exercise   Patient observed directly with PO's Yes   Type of PO's observed Ice chips  pt had  oral care<30 minutes ago   Pharyngeal Phase Signs & Symptoms Wet vocal quality  consistently after first swallow cleared with additional swa   Other treatment/comments NMES 3b used with mA not > 10.5 to strengthen swallowing musculature. 17 minutes into NMES placement tongue pumping rarely noted, and 21 minutes after NMES placed tongue pumping occasionally (25%). Effortful swallow 80%. Wet voice heard after second swallow (cleared after third swallow) after 23 minutes after NMES placed.    Pain Assessment   Pain Assessment No/denies pain   Assessment / Recommendations / Plan   Plan Continue with current plan of care   Dysphagia Recommendations   Diet recommendations NPO  ice chips sooner than 30 mintues after oral care   Progression Toward Goals   Progression toward goals Progressing toward goals           SLP Education - 05/04/15 1402    Education provided Yes   Education Details s/s aspiration PNA   Person(s) Educated Patient;Spouse   Methods Explanation;Verbal cues;Handout   Comprehension Verbalized understanding;Need further instruction  (pt)          SLP Short Term Goals - 05/01/15 1654    SLP SHORT TERM GOAL #1   Title pt will demo efforftul swallows 60% of the time in >100 opportunities over 3 sessions   Baseline three sesions 04-26-15   Status Achieved   SLP SHORT TERM GOAL #2   Title pt will throat clear/cough with hydrophonic voice with rare nonverbal cues   Status Achieved   SLP SHORT TERM GOAL #3   Title pt will complete HEP for dysphagia with modified independence (demo cues)   Status Achieved          SLP Long Term Goals - 05/04/15 1403    SLP LONG TERM GOAL #1   Title pt will demo effortful swallows 75% of the time in >120 opportunities over 3 sessions   Status Achieved   SLP LONG TERM GOAL #2   Title pt will complete dysphagia HEP with modified independence over 3 sessions   Baseline one session 05-04-15   Time 4   Period Weeks   Status On-going   SLP LONG TERM GOAL #3   Title pt will clear voice/cough with hydrophonic voice independently 90% of the time   Time 4   Period Weeks   Status On-going   SLP LONG TERM GOAL #4   Title pt/wife will indicate 3 s/s aspiration PNA   Status Achieved          Plan - 05/04/15 1402    Clinical Impression Statement Wet voice, as common, after initial swallow and cleared after second swallow.  Wet voice was evident after second swallow as session progressed today. Pt cont to demo reduced swallow strength and would cont to benefit from skilled ST to cont to maximize swallow function as this is most concerning to pt/family at this time.   Speech Therapy Frequency 3x / week   Duration 4 weeks   Treatment/Interventions Aspiration precaution training;Pharyngeal strengthening exercises;Diet toleration management by  SLP;NMES;Cueing hierarchy;Functional tasks;Patient/family education;Compensatory strategies;SLP instruction and feedback;Language facilitation   Potential to Achieve Goals Fair   Potential Considerations Severity of impairments   Consulted and Agree with Plan of Care Family member/caregiver        Problem List Patient Active Problem List   Diagnosis Date Noted  . Cerebral infarction due to embolism of left middle cerebral artery ([place]) 04/18/2015  . Chronic anticoagulation 04/18/2015  . HLD (hyperlipidemia) 04/18/2015  .  Persistent atrial fibrillation ([place]) 04/18/2015  . Gait disturbance, post-stroke 03/29/2015  . Atrial fibrillation with RVR ([place]) 03/13/2015  . Dysphagia   . Left middle cerebral artery stroke ([place]) 02/02/2015  . Dysphagia, pharyngoesophageal phase 02/02/2015  . Aphasia due to recent cerebrovascular accident 02/02/2015  . Bacterial pneumonia   . Stroke with cerebral ischemia ([place])   . Stroke ([place]) 01/31/2015  . CVA (cerebral infarction) 01/31/2015  . Aphasia   . Hypokalemia   . CAP (community acquired pneumonia) 01/26/2015  . Pulmonary fibrosis ([place]) 01/26/2015  . PCP NOTES >>>>>>>>>>>> 12/14/2014  . Vitamin D deficiency 11/04/2013  . Vasomotor rhinitis 09/22/2013  . Diarrhea 07/08/2013  . Crohn's disease ([place]) 07/08/2013  . Postinflammatory pulmonary fibrosis ([place]) 05/26/2013  . Anxiety state 05/20/2013  . Sprain of shoulder, left 06/01/2012  . Personal history of colonic polyps 04/12/2012  . Pulmonary nodules 03/02/2011  . Cough 03/02/2011  . Long term (current) use of anticoagulants 02/21/2011  . Atrial fibrillation with controlled ventricular response ([place]) 02/17/2011  . Annual physical exam 12/23/2010  . Dyspnea 09/03/2010  . HEMORRHOIDS-INTERNAL 03/08/2010  . [place] INTESTINE 03/08/2010  . RECTAL BLEEDING 03/08/2010  . HEMATOCHEZIA 03/06/2010  . Essential hypertension 02/04/2010  . HYPERTROPHY PROSTATE W/O UR OBST & OTH  LUTS 12/10/2009  . Osteopenia 12/07/2009  . PERIPHERAL NEUROPATHY 08/29/2009  . FECAL INCONTINENCE 01/29/2009  . OSTEOARTHRITIS, ANKLE, RIGHT 11/01/2008  . ERECTILE DYSFUNCTION 12/02/2007  . TREMOR 12/02/2007  . CARCINOMA, SKIN, SQUAMOUS CELL 09/16/2006  . B12 DEFICIENCY 08/27/2006  . HYPERTRIGLYCERIDEMIA 08/27/2006    [facility] ,[place], [place]  05/04/2015, 2:04 PM  [facility] [address] [place], [place], [address] Phone: [phone]   Fax:  [phone]   Name: Brian [name] MRN: [id number] Date of Birth: 24-Dec-1928

## 2015-05-04 NOTE — Patient Instructions (Signed)
Signs of Aspiration Pneumonia   . Chest pain/tightness . Fever (can be low grade) . Cough  o With foul-smelling phlegm (sputum) o With sputum containing pus or blood o With greenish sputum . Fatigue  . Shortness of breath  . Wheezing   **IF YOU HAVE THESE SIGNS, CONTACT YOUR DOCTOR OR GO TO THE EMERGENCY DEPARTMENT OR URGENT CARE AS SOON AS POSSIBLE**      

## 2015-05-05 ENCOUNTER — Emergency Department (HOSPITAL_COMMUNITY)
Admission: EM | Admit: 2015-05-05 | Discharge: 2015-05-05 | Disposition: A | Discharge status: Home / Self Care | Payer: Medicare Other | Attending: Emergency Medicine | Admitting: Emergency Medicine

## 2015-05-05 ENCOUNTER — Emergency Department (HOSPITAL_COMMUNITY): Payer: Medicare Other

## 2015-05-05 ENCOUNTER — Encounter (HOSPITAL_COMMUNITY): Payer: Self-pay | Admitting: Family Medicine

## 2015-05-05 DIAGNOSIS — Z85828 Personal history of other malignant neoplasm of skin: Secondary | ICD-10-CM | POA: Insufficient documentation

## 2015-05-05 DIAGNOSIS — J439 Emphysema, unspecified: Secondary | ICD-10-CM | POA: Insufficient documentation

## 2015-05-05 DIAGNOSIS — F329 Major depressive disorder, single episode, unspecified: Secondary | ICD-10-CM | POA: Diagnosis not present

## 2015-05-05 DIAGNOSIS — R042 Hemoptysis: Secondary | ICD-10-CM | POA: Diagnosis present

## 2015-05-05 DIAGNOSIS — I10 Essential (primary) hypertension: Secondary | ICD-10-CM | POA: Insufficient documentation

## 2015-05-05 DIAGNOSIS — R040 Epistaxis: Secondary | ICD-10-CM | POA: Insufficient documentation

## 2015-05-05 DIAGNOSIS — Z87891 Personal history of nicotine dependence: Secondary | ICD-10-CM | POA: Insufficient documentation

## 2015-05-05 DIAGNOSIS — R05 Cough: Secondary | ICD-10-CM

## 2015-05-05 DIAGNOSIS — F419 Anxiety disorder, unspecified: Secondary | ICD-10-CM | POA: Diagnosis not present

## 2015-05-05 DIAGNOSIS — Z8673 Personal history of transient ischemic attack (TIA), and cerebral infarction without residual deficits: Secondary | ICD-10-CM | POA: Insufficient documentation

## 2015-05-05 DIAGNOSIS — Z8701 Personal history of pneumonia (recurrent): Secondary | ICD-10-CM | POA: Insufficient documentation

## 2015-05-05 DIAGNOSIS — Z79899 Other long term (current) drug therapy: Secondary | ICD-10-CM | POA: Diagnosis not present

## 2015-05-05 DIAGNOSIS — J8410 Pulmonary fibrosis, unspecified: Secondary | ICD-10-CM

## 2015-05-05 DIAGNOSIS — R059 Cough, unspecified: Secondary | ICD-10-CM

## 2015-05-05 LAB — CBC WITH DIFFERENTIAL/PLATELET
BASOS ABS: 0 10*3/uL (ref 0.0–0.1)
Basophils Relative: 0 %
EOS ABS: 0.1 10*3/uL (ref 0.0–0.7)
EOS PCT: 2 %
HCT: 41.5 % (ref 39.0–52.0)
Hemoglobin: 13.7 g/dL (ref 13.0–17.0)
Lymphocytes Relative: 18 %
Lymphs Abs: 1.6 10*3/uL (ref 0.7–4.0)
MCH: 30.8 pg (ref 26.0–34.0)
MCHC: 33 g/dL (ref 30.0–36.0)
MCV: 93.3 fL (ref 78.0–100.0)
MONO ABS: 1 10*3/uL (ref 0.1–1.0)
Monocytes Relative: 11 %
Neutro Abs: 6 10*3/uL (ref 1.7–7.7)
Neutrophils Relative %: 69 %
PLATELETS: 228 10*3/uL (ref 150–400)
RBC: 4.45 MIL/uL (ref 4.22–5.81)
RDW: 14.9 % (ref 11.5–15.5)
WBC: 8.8 10*3/uL (ref 4.0–10.5)

## 2015-05-05 LAB — BASIC METABOLIC PANEL
Anion gap: 9 (ref 5–15)
BUN: 24 mg/dL — AB (ref 6–20)
CALCIUM: 9.1 mg/dL (ref 8.9–10.3)
CO2: 28 mmol/L (ref 22–32)
CREATININE: 0.72 mg/dL (ref 0.61–1.24)
Chloride: 92 mmol/L — ABNORMAL LOW (ref 101–111)
GFR calc Af Amer: >60 mL/min (ref >60)
Glucose, Bld: 86 mg/dL (ref 65–99)
Potassium: 4.9 mmol/L (ref 3.5–5.1)
SODIUM: 129 mmol/L — AB (ref 135–145)

## 2015-05-05 NOTE — ED Notes (Signed)
Pt here for coughing up blood sts bright red. sts worsening since he left the hospital. Pt denies pain.

## 2015-05-05 NOTE — ED Provider Notes (Signed)
CSN: [id number]     Arrival date & time 05/05/15  1339 History   First MD Initiated Contact with Patient 05/05/15 1628     Chief Complaint  Patient presents with  . Hemoptysis    The history is provided by the patient.   patient was brought in for coughing up blood. Has a history of pneumonia and has history of nosebleeds. He has interstitial lung disease. History of pulmonary fibrosis. Had been treated for pneumonia in the past. Has seen pulmonologist about a week ago had CT scan done that showed an infiltrate but he was not started back on antibiotics. He is not more short of breath. He is not having fevers. He is on blood thinners due to A. fib and stroke. He is on Xarelto after having a stroke on Coumadin.  Past Medical History  Diagnosis Date  . Muscle tremor     saw neurology remotely elsewhere, was Rx inderal  . Crohn's ileocolitis ([place])   . Osteopenia     per DEXA 11/2008  . Hypertension 11/11    mild  . Hemorrhoids   . Hypertrophy of prostate     w/o UR obst & oth luts  . Peripheral neuropathy (HCC)     h/o neg w/u  . ED (erectile dysfunction)   . Hypertriglyceridemia   . COPD (chronic obstructive pulmonary disease) ([place])     recent dx--no acute problems  . Hiatal hernia 1956    found while in service  . Anxiety   . Atrial fibrillation with RVR ([place])   . Long term (current) use of anticoagulants 02/21/2011  . Serrated adenoma of colon 03/2010  . Renal cyst     bilateral  . Pulmonary fibrosis ([place]) 2015    Rx Esbriet ~ 10-2013 ([name]), Dr. [name]  . On home oxygen therapy     "2L; 20-24h for the past week" (01/26/2015)  . Emphysema lung ([place]) 2016    Dr. [name]  . B12 deficiency anemia   . Idiopathic pulmonary fibrosis ([place]) 2016    Dr. [name]  . Pneumonia ?2015  . CAP (community acquired pneumonia) 01/26/2015  . Osteoarthritis     "maybe in my hands, feet" (01/26/2015)  . Depression   . Squamous cell carcinoma of skin of scalp     tx'd ~ 10yrs; "froze them off"   . Squamous cell carcinoma, face      "froze them off"  . Melanoma of scalp ([place])   . Stroke [facility])    Past Surgical History  Procedure Laterality Date  . Colon resection  1984    resection of distal ileum and cecum w/ appendectomy, 18-inch small intestine, for Crohn's disease  . Colon surgery    . Appendectomy  1984  . Cataract extraction w/ intraocular lens  implant, bilateral Bilateral 2007  . Cholecystectomy  02/17/2011    Procedure: LAPAROSCOPIC CHOLECYSTECTOMY WITH INTRAOPERATIVE CHOLANGIOGRAM;  Surgeon: Edward Jolly, MD;  Location: [facility];  Service: General;  Laterality: N/A;  . Mohs surgery Right ~ 2014    "side of my scalp"   Family History  Problem Relation Age of Onset  . Crohn's disease Brother   . Breast cancer Mother   . Colon cancer Neg Hx   . Prostate cancer Neg Hx   . Heart attack Father 70  . Dementia Sister   . Diabetes Sister   . Stroke Mother 33   Social History  Substance Use Topics  . Smoking status: Former Smoker -- 1.00  packs/day for 40 years    Types: Cigarettes, Pipe    Quit date: 03/24/1990  . Smokeless tobacco: Never Used  . Alcohol Use: No     Comment:      Review of Systems  Constitutional: Negative for activity change and appetite change.  HENT: Positive for nosebleeds.   Eyes: Negative for pain.  Respiratory: Negative for chest tightness and shortness of breath.        Hemoptysis  Cardiovascular: Negative for chest pain and leg swelling.  Gastrointestinal: Negative for nausea, vomiting, abdominal pain and diarrhea.  Genitourinary: Negative for flank pain.  Musculoskeletal: Negative for back pain and neck stiffness.  Skin: Negative for rash.  Neurological: Negative for weakness, numbness and headaches.  Psychiatric/Behavioral: Negative for behavioral problems.      Allergies  Review of patient's allergies indicates no known allergies.  Home Medications   Prior to Admission medications   Medication Sig Start Date End  Date Taking? Authorizing Provider  apixaban (ELIQUIS) 5 MG TABS tablet Place 1 tablet (5 mg total) into feeding tube 2 (two) times daily. 03/13/15  Yes Larey Dresser, MD  busPIRone (BUSPAR) 15 MG tablet Place 1 tablet (15 mg total) into feeding tube 2 (two) times daily. 02/16/15  Yes Ivan Anchors Love, PA-C  clonazePAM (KLONOPIN) 0.5 MG tablet Place 0.5 tablets (0.25 mg total) into feeding tube 3 (three) times daily as needed for anxiety. 02/16/15  Yes Ivan Anchors Love, PA-C  Cyanocobalamin (NASCOBAL) 500 MCG/0.1ML SOLN Use one spray nasally per week as directed. Patient taking differently: Place 0.1 mLs into the nose every Wednesday. Use one spray nasally per week as directed. 12/14/14  Yes Colon Branch, MD  gemfibrozil (LOPID) 600 MG tablet Place 1 tablet (600 mg total) into feeding tube 2 (two) times daily. 02/16/15  Yes Ivan Anchors Love, PA-C  loperamide (IMODIUM) 1 MG/5ML solution Place 10 mLs (2 mg total) into feeding tube as needed for diarrhea or loose stools. Patient taking differently: Place 2 mg into feeding tube 2 (two) times daily.  03/16/15  Yes Midge Minium, MD  Mesalamine (PENTASA PO) Give 1 capsule by tube 2 (two) times daily.    Yes Historical Provider, MD  nebivolol (BYSTOLIC) 5 MG tablet Take 1 tablet (5 mg total) by mouth daily. Patient taking differently: Place 5 mg into feeding tube daily.  03/12/15  Yes Larey Dresser, MD  Nutritional Supplements (FEEDING SUPPLEMENT, JEVITY 1.2 CAL,) LIQD Place 340 mLs into feeding tube 5 (five) times daily. Patient taking differently: Place 350 mLs into feeding tube 5 (five) times daily.  02/16/15  Yes Ivan Anchors Love, PA-C  OXYGEN Inhale into the lungs continuous. 2 L   Yes Historical Provider, MD  Pirfenidone 267 MG CAPS Take 3 capsules by mouth 3 (three) times daily. Put in feeding tube Patient taking differently: Place 801 mg into feeding tube 3 (three) times daily. 3 capsules 02/16/15  Yes Ivan Anchors Love, PA-C  pravastatin (PRAVACHOL) 10 MG  tablet Place 1 tablet (10 mg total) into feeding tube daily at 6 PM. 04/23/15  Yes Colon Branch, MD  Water For Irrigation, Sterile (FREE WATER) SOLN Place 150 mLs into feeding tube 3 (three) times daily. Use filtered water 02/16/15  Yes Ivan Anchors Love, PA-C  antiseptic oral rinse (CPC / CETYLPYRIDINIUM CHLORIDE 0.05%) 0.05 % LIQD solution 7 mLs by Mouth Rinse route 2 times daily at 12 noon and 4 pm. Patient not taking: Reported on 05/05/2015 02/16/15   Olin Hauser  S Love, PA-C  chlorhexidine (PERIDEX) 0.12 % solution 15 mLs by Mouth Rinse route 2 (two) times daily. Patient not taking: Reported on 05/05/2015 02/16/15   Ivan Anchors Love, PA-C  famotidine (PEPCID) 40 MG tablet Place 1 tablet (40 mg total) into feeding tube daily. Patient not taking: Reported on 05/05/2015 02/27/15   Colon Branch, MD   BP 115/60 mmHg  Pulse 83  Temp(Src) 98.5 F (36.9 C) (Oral)  Resp 25  SpO2 95% Physical Exam  Constitutional: He appears well-developed.  HENT:  Head: Atraumatic.  Eyes: EOM are normal.  Neck: Neck supple.  Cardiovascular: Normal rate.   Pulmonary/Chest: Effort normal. No respiratory distress. He has no wheezes. He has no rales.  Abdominal: Soft.  Neurological: He is alert.  Skin: Skin is warm.  Psychiatric: He has a normal mood and affect.    ED Course  Procedures (including critical care time) Labs Review Labs Reviewed  BASIC METABOLIC PANEL - Abnormal; Notable for the following:    Sodium 129 (*)    Chloride 92 (*)    BUN 24 (*)    All other components within normal limits  CBC WITH DIFFERENTIAL/PLATELET    Imaging Review Dg Chest 2 View  05/05/2015  CLINICAL DATA:  Cough with questionable hemoptysis. EXAM: CHEST  2 VIEW COMPARISON:  March 27, 2015, February 26, 2015, and February 02, 2015 FINDINGS: Bibasilar interstitial fibrosis remains. There is persistent airspace consolidation in the right upper lobe with volume loss. There is no new opacity. The heart size and pulmonary vascularity are  normal. No adenopathy is evident. No bone lesions. IMPRESSION: Persistent airspace consolidation with volume loss right upper lobe. The volume loss in the right upper lobe is stable compared to the most recent study but has progressed over time. Given the persistence of this right upper lobe consolidation, it may be prudent to consider bronchoscopy to evaluate the right upper lobe bronchus. There is bibasilar fibrosis, stable. No new opacity.  No change in cardiac silhouette. Electronically Signed   By: Lowella Grip III M.D.   On: 05/05/2015 14:27   I have personally reviewed and evaluated these images and lab results as part of my medical decision-making.   EKG Interpretation None      MDM   Final diagnoses:  Epistaxis  Pulmonary fibrosis ([place])    Patient with small amount of hemoptysis/coughing up blood. Likely has come from nose. X-ray shows persistent infiltrate has had CT for the same. Has seen pulmonology for same but they did not treat him. This point I will not treat him since x-ray is stable and is not having fevers chills or increasing white count. He is on Xarelto but is not actively bleeding at this time. No blood in posterior pharynx but there is red blood nose. Will discharge home to follow-up with his pulmonologist.    Davonna Belling, MD 05/05/15 [phone]

## 2015-05-05 NOTE — Discharge Instructions (Signed)

## 2015-05-07 ENCOUNTER — Ambulatory Visit: Payer: Medicare Other

## 2015-05-07 ENCOUNTER — Ambulatory Visit: Payer: Self-pay | Admitting: Rehabilitation

## 2015-05-07 DIAGNOSIS — R1310 Dysphagia, unspecified: Secondary | ICD-10-CM

## 2015-05-07 DIAGNOSIS — I69320 Aphasia following cerebral infarction: Secondary | ICD-10-CM

## 2015-05-07 NOTE — Therapy (Signed)
[facility] [address] [place], [place], [address] Phone: [phone]   Fax:  [phone]  Speech Language Pathology Treatment  Patient Details  Name: Brian [name] MRN: [id number] Date of Birth: July 12, 1928 Referring Provider: Rosalin Hawking M.D.  Encounter Date: 05/07/2015      End of Session - 05/07/15 1625    Visit Number 13   Number of Visits 25   Date for SLP Re-Evaluation 06/01/15   SLP Start Time 1449   SLP Stop Time  1531   SLP Time Calculation (min) 42 min   Activity Tolerance Patient tolerated treatment well      Past Medical History  Diagnosis Date  . Muscle tremor     saw neurology remotely elsewhere, was Rx inderal  . Crohn's ileocolitis ([place])   . Osteopenia     per DEXA 11/2008  . Hypertension 11/11    mild  . Hemorrhoids   . Hypertrophy of prostate     w/o UR obst & oth luts  . Peripheral neuropathy (HCC)     h/o neg w/u  . ED (erectile dysfunction)   . Hypertriglyceridemia   . COPD (chronic obstructive pulmonary disease) ([place])     recent dx--no acute problems  . Hiatal hernia 1956    found while in service  . Anxiety   . Atrial fibrillation with RVR ([place])   . Long term (current) use of anticoagulants 02/21/2011  . Serrated adenoma of colon 03/2010  . Renal cyst     bilateral  . Pulmonary fibrosis ([place]) 2015    Rx Esbriet ~ 10-2013 ([name]), Dr. [name]  . On home oxygen therapy     "2L; 20-24h for the past week" (01/26/2015)  . Emphysema lung ([place]) 2016    Dr. [name]  . B12 deficiency anemia   . Idiopathic pulmonary fibrosis ([place]) 2016    Dr. [name]  . Pneumonia ?2015  . CAP (community acquired pneumonia) 01/26/2015  . Osteoarthritis     "maybe in my hands, feet" (01/26/2015)  . Depression   . Squamous cell carcinoma of skin of scalp     tx'd ~ 4yrs; "froze them off"  . Squamous cell carcinoma, face      "froze them off"  . Melanoma of scalp ([place])   . Stroke [facility])     Past  Surgical History  Procedure Laterality Date  . Colon resection  1984    resection of distal ileum and cecum w/ appendectomy, 18-inch small intestine, for Crohn's disease  . Colon surgery    . Appendectomy  1984  . Cataract extraction w/ intraocular lens  implant, bilateral Bilateral 2007  . Cholecystectomy  02/17/2011    Procedure: LAPAROSCOPIC CHOLECYSTECTOMY WITH INTRAOPERATIVE CHOLANGIOGRAM;  Surgeon: Edward Jolly, MD;  Location: [facility];  Service: General;  Laterality: N/A;  . Mohs surgery Right ~ 2014    "side of my scalp"    There were no vitals filed for this visit.  Visit Diagnosis: Dysphagia  Aphasia following cerebral infarction      Subjective Assessment - 05/07/15 1456    Subjective Pt had oral care <30 minutes prior to therapy time today.   Patient is accompained by: Family member  wife   Currently in Pain? No/denies               ADULT SLP TREATMENT - 05/07/15 1459    General Information   Behavior/Cognition Alert;Cooperative;Pleasant mood;Requires cueing   Treatment Provided   Treatment provided  Dysphagia   Dysphagia Treatment   Temperature Spikes Noted No   Respiratory Status Nasal cannula   Treatment Methods Skilled observation;Therapeutic exercise   Patient observed directly with PO's Yes   Type of PO's observed Ice chips   Pharyngeal Phase Signs & Symptoms Wet vocal quality  consistent p first swallow cleared p second swallow   Other treatment/comments 3a placement with NMES today used with exercises from HEP as well as ice chips and small bites popsicele with effortful swallow. With usual min A pt was able to maintain effortful swallow for nearly the entire session. Wet voice heard after second swallow intermittently after 16 minutes of placed with NMES.   Pain Assessment   Pain Assessment No/denies pain   Assessment / Recommendations / Plan   Plan Continue with current plan of care   Progression Toward Goals   Progression toward goals  Progressing toward goals            SLP Short Term Goals - 05/01/15 1654    SLP SHORT TERM GOAL #1   Title pt will demo efforftul swallows 60% of the time in >100 opportunities over 3 sessions   Baseline three sesions 04-26-15   Status Achieved   SLP SHORT TERM GOAL #2   Title pt will throat clear/cough with hydrophonic voice with rare nonverbal cues   Status Achieved   SLP SHORT TERM GOAL #3   Title pt will complete HEP for dysphagia with modified independence (demo cues)   Status Achieved          SLP Long Term Goals - 05/07/15 1627    SLP LONG TERM GOAL #1   Title pt will demo effortful swallows 75% of the time in >120 opportunities over 3 sessions   Status Achieved   SLP LONG TERM GOAL #2   Title pt will complete dysphagia HEP with modified independence over 3 sessions   Baseline one session 05-04-15   Time 3   Period Weeks   Status On-going   SLP LONG TERM GOAL #3   Title pt will clear voice/cough with hydrophonic voice independently 90% of the time   Time 3   Period Weeks   Status On-going   SLP LONG TERM GOAL #4   Title pt/wife will indicate 3 s/s aspiration PNA   Status Achieved   SLP LONG TERM GOAL #5   Title pt to demo effortful swallow in 85% of swallows up to 25 minutes after NMES placement   Time 3   Period Weeks   Status New          Plan - 05/07/15 1626    Clinical Impression Statement Wet voice, as common, after initial swallow and cleared after second swallow.  Wet voice was evident intermittently after second swallow, intermittently, after 16 minutes NMES placement. Pt cont to demo reduced swallow strength and would cont to benefit from skilled ST to cont to maximize swallow function as this is most concerning to pt/family at this time.   Speech Therapy Frequency 3x / week   Duration 4 weeks  3 weeks   Treatment/Interventions Aspiration precaution training;Pharyngeal strengthening exercises;Diet toleration management by SLP;NMES;Cueing  hierarchy;Functional tasks;Patient/family education;Compensatory strategies;SLP instruction and feedback;Language facilitation   Potential to Achieve Goals Fair   Potential Considerations Severity of impairments   Consulted and Agree with Plan of Care Family member/caregiver        Problem List Patient Active Problem List   Diagnosis Date Noted  . Cerebral infarction due to  embolism of left middle cerebral artery ([place]) 04/18/2015  . Chronic anticoagulation 04/18/2015  . HLD (hyperlipidemia) 04/18/2015  . Persistent atrial fibrillation ([place]) 04/18/2015  . Gait disturbance, post-stroke 03/29/2015  . Atrial fibrillation with RVR ([place]) 03/13/2015  . Dysphagia   . Left middle cerebral artery stroke ([place]) 02/02/2015  . Dysphagia, pharyngoesophageal phase 02/02/2015  . Aphasia due to recent cerebrovascular accident 02/02/2015  . Bacterial pneumonia   . Stroke with cerebral ischemia ([place])   . Stroke ([place]) 01/31/2015  . CVA (cerebral infarction) 01/31/2015  . Aphasia   . Hypokalemia   . CAP (community acquired pneumonia) 01/26/2015  . Pulmonary fibrosis ([place]) 01/26/2015  . PCP NOTES >>>>>>>>>>>> 12/14/2014  . Vitamin D deficiency 11/04/2013  . Vasomotor rhinitis 09/22/2013  . Diarrhea 07/08/2013  . Crohn's disease ([place]) 07/08/2013  . Postinflammatory pulmonary fibrosis ([place]) 05/26/2013  . Anxiety state 05/20/2013  . Sprain of shoulder, left 06/01/2012  . Personal history of colonic polyps 04/12/2012  . Pulmonary nodules 03/02/2011  . Cough 03/02/2011  . Long term (current) use of anticoagulants 02/21/2011  . Atrial fibrillation with controlled ventricular response ([place]) 02/17/2011  . Annual physical exam 12/23/2010  . Dyspnea 09/03/2010  . HEMORRHOIDS-INTERNAL 03/08/2010  . [place] INTESTINE 03/08/2010  . RECTAL BLEEDING 03/08/2010  . HEMATOCHEZIA 03/06/2010  . Essential hypertension 02/04/2010  . HYPERTROPHY PROSTATE W/O UR OBST & OTH LUTS 12/10/2009   . Osteopenia 12/07/2009  . PERIPHERAL NEUROPATHY 08/29/2009  . FECAL INCONTINENCE 01/29/2009  . OSTEOARTHRITIS, ANKLE, RIGHT 11/01/2008  . ERECTILE DYSFUNCTION 12/02/2007  . TREMOR 12/02/2007  . CARCINOMA, SKIN, SQUAMOUS CELL 09/16/2006  . B12 DEFICIENCY 08/27/2006  . HYPERTRIGLYCERIDEMIA 08/27/2006    [place] ,[place], [place]   05/07/2015, 4:29 PM  [facility] [address] [place], [place], [address] Phone: [phone]   Fax:  [phone]   Name: JARDON HELLWEGE MRN: [id number] Date of Birth: March 28, 1928

## 2015-05-09 ENCOUNTER — Ambulatory Visit: Payer: Medicare Other

## 2015-05-09 DIAGNOSIS — R1310 Dysphagia, unspecified: Secondary | ICD-10-CM

## 2015-05-09 DIAGNOSIS — I69320 Aphasia following cerebral infarction: Secondary | ICD-10-CM

## 2015-05-09 NOTE — Therapy (Signed)
[facility] [address] [place], [place], [address] Phone: [phone]   Fax:  [phone]  Speech Language Pathology Treatment  Patient Details  Name: Brian [name] MRN: [id number] Date of Birth: May 14, 1928 Referring Provider: Rosalin Hawking M.D.  Encounter Date: 05/09/2015      End of Session - 05/09/15 1318    Visit Number 14   Number of Visits 25   Date for SLP Re-Evaluation 06/01/15   SLP Start Time 1148   SLP Stop Time  1229   SLP Time Calculation (min) 41 min   Activity Tolerance Patient tolerated treatment well      Past Medical History  Diagnosis Date  . Muscle tremor     saw neurology remotely elsewhere, was Rx inderal  . Crohn's ileocolitis ([place])   . Osteopenia     per DEXA 11/2008  . Hypertension 11/11    mild  . Hemorrhoids   . Hypertrophy of prostate     w/o UR obst & oth luts  . Peripheral neuropathy (HCC)     h/o neg w/u  . ED (erectile dysfunction)   . Hypertriglyceridemia   . COPD (chronic obstructive pulmonary disease) ([place])     recent dx--no acute problems  . Hiatal hernia 1956    found while in service  . Anxiety   . Atrial fibrillation with RVR ([place])   . Long term (current) use of anticoagulants 02/21/2011  . Serrated adenoma of colon 03/2010  . Renal cyst     bilateral  . Pulmonary fibrosis ([place]) 2015    Rx Esbriet ~ 10-2013 ([name]), Dr. [name]  . On home oxygen therapy     "2L; 20-24h for the past week" (01/26/2015)  . Emphysema lung ([place]) 2016    Dr. [name]  . B12 deficiency anemia   . Idiopathic pulmonary fibrosis ([place]) 2016    Dr. [name]  . Pneumonia ?2015  . CAP (community acquired pneumonia) 01/26/2015  . Osteoarthritis     "maybe in my hands, feet" (01/26/2015)  . Depression   . Squamous cell carcinoma of skin of scalp     tx'd ~ 90yrs; "froze them off"  . Squamous cell carcinoma, face      "froze them off"  . Melanoma of scalp ([place])   . Stroke [facility])     Past  Surgical History  Procedure Laterality Date  . Colon resection  1984    resection of distal ileum and cecum w/ appendectomy, 18-inch small intestine, for Crohn's disease  . Colon surgery    . Appendectomy  1984  . Cataract extraction w/ intraocular lens  implant, bilateral Bilateral 2007  . Cholecystectomy  02/17/2011    Procedure: LAPAROSCOPIC CHOLECYSTECTOMY WITH INTRAOPERATIVE CHOLANGIOGRAM;  Surgeon: Edward Jolly, MD;  Location: [facility];  Service: General;  Laterality: N/A;  . Mohs surgery Right ~ 2014    "side of my scalp"    There were no vitals filed for this visit.  Visit Diagnosis: Dysphagia  Aphasia following cerebral infarction      Subjective Assessment - 05/09/15 1208    Subjective Wife reports pt picked up phone, dialed phone number and talked to her brother about how to fix a clock last night!   Patient is accompained by: Family member  wife   Currently in Pain? No/denies               ADULT SLP TREATMENT - 05/09/15 1210    General Information   Behavior/Cognition  Alert;Cooperative;Pleasant mood;Requires cueing   Treatment Provided   Treatment provided Dysphagia   Dysphagia Treatment   Temperature Spikes Noted No   Respiratory Status Nasal cannula   Treatment Methods Skilled observation;Therapeutic exercise   Patient observed directly with PO's Yes   Type of PO's observed Ice chips  popsicle   Pharyngeal Phase Signs & Symptoms Wet vocal quality  after initial swallow consistently, cleared after 2nd swallo   Other treatment/comments 3b placement with mA 10.0 with NMES today paired with aspects of pt's HEP as well as effortful swallows with ice chips and popsicle. No overt s/s aspiration PNA observed or reported today. Pt spontaneously swallowed with hydrophonic voice 50% of the time. Effortful swallow noted 85% of the time, and extra time with tongue pumping noted increasing over the course of the session.   Assessment / Recommendations / Plan    Plan Continue with current plan of care   Dysphagia Recommendations   Diet recommendations NPO  ice chips <30 minutes after oral care   Progression Toward Goals   Progression toward goals Progressing toward goals            SLP Short Term Goals - 05/01/15 1654    SLP SHORT TERM GOAL #1   Title pt will demo efforftul swallows 60% of the time in >100 opportunities over 3 sessions   Baseline three sesions 04-26-15   Status Achieved   SLP SHORT TERM GOAL #2   Title pt will throat clear/cough with hydrophonic voice with rare nonverbal cues   Status Achieved   SLP SHORT TERM GOAL #3   Title pt will complete HEP for dysphagia with modified independence (demo cues)   Status Achieved          SLP Long Term Goals - 05/09/15 1324    SLP LONG TERM GOAL #1   Title pt will demo effortful swallows 75% of the time in >120 opportunities over 3 sessions   Status Achieved   SLP LONG TERM GOAL #2   Title pt will complete dysphagia HEP with modified independence over 3 sessions   Baseline one session 05-04-15   Time 3   Period Weeks   Status On-going   SLP LONG TERM GOAL #3   Title pt will clear voice/cough with hydrophonic voice independently 90% of the time   Time 3   Period Weeks   Status On-going   SLP LONG TERM GOAL #4   Title pt/wife will indicate 3 s/s aspiration PNA   Status Achieved   SLP LONG TERM GOAL #5   Title pt to demo effortful swallow in 85% of swallows up to 25 minutes after NMES placement   Time 3   Period Weeks   Status New          Plan - 05/09/15 1319    Clinical Impression Statement Wet voice, as common, after initial swallow and cleared after second swallow.  Tongue pumping began approx 16 minutes into session and incr'd as session went on. Pt cont to demo reduced swallow strength and would cont to benefit from skilled ST to cont to maximize swallow function as this is most concerning to pt/family at this time.   Speech Therapy Frequency 3x / week    Duration 4 weeks  3 weeks   Treatment/Interventions Aspiration precaution training;Pharyngeal strengthening exercises;Diet toleration management by SLP;NMES;Cueing hierarchy;Functional tasks;Patient/family education;Compensatory strategies;SLP instruction and feedback;Language facilitation   Potential to Achieve Goals Fair   Potential Considerations Severity of impairments   Consulted  and Agree with Plan of Care Family member/caregiver        Problem List Patient Active Problem List   Diagnosis Date Noted  . Cerebral infarction due to embolism of left middle cerebral artery ([place]) 04/18/2015  . Chronic anticoagulation 04/18/2015  . HLD (hyperlipidemia) 04/18/2015  . Persistent atrial fibrillation ([place]) 04/18/2015  . Gait disturbance, post-stroke 03/29/2015  . Atrial fibrillation with RVR ([place]) 03/13/2015  . Dysphagia   . Left middle cerebral artery stroke ([place]) 02/02/2015  . Dysphagia, pharyngoesophageal phase 02/02/2015  . Aphasia due to recent cerebrovascular accident 02/02/2015  . Bacterial pneumonia   . Stroke with cerebral ischemia ([place])   . Stroke ([place]) 01/31/2015  . CVA (cerebral infarction) 01/31/2015  . Aphasia   . Hypokalemia   . CAP (community acquired pneumonia) 01/26/2015  . Pulmonary fibrosis ([place]) 01/26/2015  . PCP NOTES >>>>>>>>>>>> 12/14/2014  . Vitamin D deficiency 11/04/2013  . Vasomotor rhinitis 09/22/2013  . Diarrhea 07/08/2013  . Crohn's disease ([place]) 07/08/2013  . Postinflammatory pulmonary fibrosis ([place]) 05/26/2013  . Anxiety state 05/20/2013  . Sprain of shoulder, left 06/01/2012  . Personal history of colonic polyps 04/12/2012  . Pulmonary nodules 03/02/2011  . Cough 03/02/2011  . Long term (current) use of anticoagulants 02/21/2011  . Atrial fibrillation with controlled ventricular response ([place]) 02/17/2011  . Annual physical exam 12/23/2010  . Dyspnea 09/03/2010  . HEMORRHOIDS-INTERNAL 03/08/2010  . [place] INTESTINE  03/08/2010  . RECTAL BLEEDING 03/08/2010  . HEMATOCHEZIA 03/06/2010  . Essential hypertension 02/04/2010  . HYPERTROPHY PROSTATE W/O UR OBST & OTH LUTS 12/10/2009  . Osteopenia 12/07/2009  . PERIPHERAL NEUROPATHY 08/29/2009  . FECAL INCONTINENCE 01/29/2009  . OSTEOARTHRITIS, ANKLE, RIGHT 11/01/2008  . ERECTILE DYSFUNCTION 12/02/2007  . TREMOR 12/02/2007  . CARCINOMA, SKIN, SQUAMOUS CELL 09/16/2006  . B12 DEFICIENCY 08/27/2006  . HYPERTRIGLYCERIDEMIA 08/27/2006    [facility] ,[place], [place]  05/09/2015, 1:26 PM  [facility] [address] [place], [place], [address] Phone: [phone]   Fax:  [phone]   Name: Brian [name] MRN: [id number] Date of Birth: 03-Apr-1928

## 2015-05-11 ENCOUNTER — Ambulatory Visit: Payer: Medicare Other

## 2015-05-11 ENCOUNTER — Ambulatory Visit: Payer: Self-pay | Admitting: Rehabilitation

## 2015-05-11 DIAGNOSIS — I69320 Aphasia following cerebral infarction: Secondary | ICD-10-CM | POA: Diagnosis not present

## 2015-05-11 DIAGNOSIS — R1310 Dysphagia, unspecified: Secondary | ICD-10-CM | POA: Diagnosis not present

## 2015-05-11 NOTE — Therapy (Signed)
[facility] [address] [place], [place], [address] Phone: [phone]   Fax:  [phone]  Speech Language Pathology Treatment  Patient Details  Name: Brian [name] MRN: [id number] Date of Birth: [birth year] Referring Provider: Rosalin Hawking M.D.  Encounter Date: 05/11/2015      End of Session - 05/11/15 1346    Visit Number 15   Number of Visits 25   Date for SLP Re-Evaluation 06/01/15   SLP Start Time 57   SLP Stop Time  1146   SLP Time Calculation (min) 40 min   Activity Tolerance Patient tolerated treatment well      Past Medical History  Diagnosis Date  . Muscle tremor     saw neurology remotely elsewhere, was Rx inderal  . Crohn's ileocolitis ([place])   . Osteopenia     per DEXA 11/2008  . Hypertension 11/11    mild  . Hemorrhoids   . Hypertrophy of prostate     w/o UR obst & oth luts  . Peripheral neuropathy (HCC)     h/o neg w/u  . ED (erectile dysfunction)   . Hypertriglyceridemia   . COPD (chronic obstructive pulmonary disease) ([place])     recent dx--no acute problems  . Hiatal hernia 1956    found while in service  . Anxiety   . Atrial fibrillation with RVR ([place])   . Long term (current) use of anticoagulants 02/21/2011  . Serrated adenoma of colon 03/2010  . Renal cyst     bilateral  . Pulmonary fibrosis ([place]) 2015    Rx Esbriet ~ 10-2013 ([name]), Dr. [name]  . On home oxygen therapy     "2L; 20-24h for the past week" (01/26/2015)  . Emphysema lung ([place]) 2016    Dr. [name]  . B12 deficiency anemia   . Idiopathic pulmonary fibrosis ([place]) 2016    Dr. [name]  . Pneumonia ?2015  . CAP (community acquired pneumonia) 01/26/2015  . Osteoarthritis     "maybe in my hands, feet" (01/26/2015)  . Depression   . Squamous cell carcinoma of skin of scalp     tx'd ~ 22yrs; "froze them off"  . Squamous cell carcinoma, face      "froze them off"  . Melanoma of scalp ([place])   . Stroke [facility])     Past  Surgical History  Procedure Laterality Date  . Colon resection  1984    resection of distal ileum and cecum w/ appendectomy, 18-inch small intestine, for Crohn's disease  . Colon surgery    . Appendectomy  1984  . Cataract extraction w/ intraocular lens  implant, bilateral Bilateral 2007  . Cholecystectomy  02/17/2011    Procedure: LAPAROSCOPIC CHOLECYSTECTOMY WITH INTRAOPERATIVE CHOLANGIOGRAM;  Surgeon: Edward Jolly, MD;  Location: [facility];  Service: General;  Laterality: N/A;  . Mohs surgery Right ~ 2014    "side of my scalp"    There were no vitals filed for this visit.  Visit Diagnosis: Dysphagia  Aphasia following cerebral infarction      Subjective Assessment - 05/11/15 1116    Patient is accompained by: Family member  wife   Currently in Pain? No/denies               ADULT SLP TREATMENT - 05/11/15 1129    General Information   Behavior/Cognition Alert;Cooperative;Pleasant mood;Requires cueing   Treatment Provided   Treatment provided Dysphagia   Dysphagia Treatment   Temperature Spikes Noted No   Respiratory  Status Nasal cannula   Treatment Methods Skilled observation;Therapeutic exercise   Patient observed directly with PO's Yes   Type of PO's observed Ice chips  popsicle   Pharyngeal Phase Signs & Symptoms Wet vocal quality  following first swallow consistently (90%)   Other treatment/comments NMES with 3a placement (mA no greater than 11.0) used to aspects of HEP as well as with ice chips and popsicle with effortful swallow, achieved approx 60% of the time. After NMES placed 25 minutes pt with wet voice after 2nd swallow, cleared after thrid swallow 90% of the time.   Assessment / Recommendations / Plan   Plan Continue with current plan of care   Progression Toward Goals   Progression toward goals Progressing toward goals            SLP Short Term Goals - 05/01/15 1654    SLP SHORT TERM GOAL #1   Title pt will demo efforftul swallows 60%  of the time in >100 opportunities over 3 sessions   Baseline three sesions 04-26-15   Status Achieved   SLP SHORT TERM GOAL #2   Title pt will throat clear/cough with hydrophonic voice with rare nonverbal cues   Status Achieved   SLP SHORT TERM GOAL #3   Title pt will complete HEP for dysphagia with modified independence (demo cues)   Status Achieved          SLP Long Term Goals - 05/11/15 1348    SLP LONG TERM GOAL #1   Title pt will demo effortful swallows 75% of the time in >120 opportunities over 3 sessions   Status Achieved   SLP LONG TERM GOAL #2   Title pt will complete dysphagia HEP with modified independence over 3 sessions   Baseline one session 05-04-15   Time 3   Period Weeks   Status On-going   SLP LONG TERM GOAL #3   Title pt will clear voice/cough with hydrophonic voice independently 90% of the time   Time 3   Period Weeks   Status On-going   SLP LONG TERM GOAL #4   Title pt/wife will indicate 3 s/s aspiration PNA   Status Achieved   SLP LONG TERM GOAL #5   Title pt to demo effortful swallow in 85% of swallows up to 25 minutes after NMES placement   Time 3   Period Weeks   Status On-going          Plan - 05/11/15 1347    Clinical Impression Statement Wet voice, as common, after initial swallow and cleared after second swallow.  Tongue pumping began approx 21 minutes into session and was intermittent as session went on. Pt cont to demo reduced swallow strength and would cont to benefit from skilled ST to cont to maximize swallow function as this is most concerning to pt/family at this time. Told wife/pt that look at progress in approx 2 weeks and decide at that time to refer to [facility] for evaluation of swallowing or to request follow up modified bairum swallow eval (MBSS).   Speech Therapy Frequency 3x / week   Duration 4 weeks  3 weeks   Treatment/Interventions Aspiration precaution training;Pharyngeal strengthening exercises;Diet toleration management by  SLP;NMES;Cueing hierarchy;Functional tasks;Patient/family education;Compensatory strategies;SLP instruction and feedback;Language facilitation   Potential to Achieve Goals Fair   Potential Considerations Severity of impairments   Consulted and Agree with Plan of Care Family member/caregiver        Problem List Patient Active Problem List   Diagnosis  Date Noted  . Cerebral infarction due to embolism of left middle cerebral artery ([place]) 04/18/2015  . Chronic anticoagulation 04/18/2015  . HLD (hyperlipidemia) 04/18/2015  . Persistent atrial fibrillation ([place]) 04/18/2015  . Gait disturbance, post-stroke 03/29/2015  . Atrial fibrillation with RVR ([place]) 03/13/2015  . Dysphagia   . Left middle cerebral artery stroke ([place]) 02/02/2015  . Dysphagia, pharyngoesophageal phase 02/02/2015  . Aphasia due to recent cerebrovascular accident 02/02/2015  . Bacterial pneumonia   . Stroke with cerebral ischemia ([place])   . Stroke ([place]) 01/31/2015  . CVA (cerebral infarction) 01/31/2015  . Aphasia   . Hypokalemia   . CAP (community acquired pneumonia) 01/26/2015  . Pulmonary fibrosis ([place]) 01/26/2015  . PCP NOTES >>>>>>>>>>>> 12/14/2014  . Vitamin D deficiency 11/04/2013  . Vasomotor rhinitis 09/22/2013  . Diarrhea 07/08/2013  . Crohn's disease ([place]) 07/08/2013  . Postinflammatory pulmonary fibrosis ([place]) 05/26/2013  . Anxiety state 05/20/2013  . Sprain of shoulder, left 06/01/2012  . Personal history of colonic polyps 04/12/2012  . Pulmonary nodules 03/02/2011  . Cough 03/02/2011  . Long term (current) use of anticoagulants 02/21/2011  . Atrial fibrillation with controlled ventricular response ([place]) 02/17/2011  . Annual physical exam 12/23/2010  . Dyspnea 09/03/2010  . HEMORRHOIDS-INTERNAL 03/08/2010  . [place] INTESTINE 03/08/2010  . RECTAL BLEEDING 03/08/2010  . HEMATOCHEZIA 03/06/2010  . Essential hypertension 02/04/2010  . HYPERTROPHY PROSTATE W/O UR OBST & OTH  LUTS 12/10/2009  . Osteopenia 12/07/2009  . PERIPHERAL NEUROPATHY 08/29/2009  . FECAL INCONTINENCE 01/29/2009  . OSTEOARTHRITIS, ANKLE, RIGHT 11/01/2008  . ERECTILE DYSFUNCTION 12/02/2007  . TREMOR 12/02/2007  . CARCINOMA, SKIN, SQUAMOUS CELL 09/16/2006  . B12 DEFICIENCY 08/27/2006  . HYPERTRIGLYCERIDEMIA 08/27/2006    Evalie Hargraves ,MS, [place]  05/11/2015, 1:51 PM  [facility] [address] [place], [place], [address] Phone: [phone]   Fax:  [phone]   Name: Brian [name] MRN: [id number] Date of Birth: 1928/12/25

## 2015-05-14 ENCOUNTER — Ambulatory Visit: Payer: Medicare Other | Admitting: Physical Therapy

## 2015-05-14 ENCOUNTER — Ambulatory Visit: Payer: Medicare Other

## 2015-05-14 DIAGNOSIS — I69320 Aphasia following cerebral infarction: Secondary | ICD-10-CM

## 2015-05-14 DIAGNOSIS — R1310 Dysphagia, unspecified: Secondary | ICD-10-CM | POA: Diagnosis not present

## 2015-05-14 NOTE — Therapy (Signed)
[facility] [address] [place], [place], [address] Phone: [phone]   Fax:  [phone]  Speech Language Pathology Treatment  Patient Details  Name: Brian [name] MRN: [id number] Date of Birth: 1928-11-04 Referring Provider: Rosalin Hawking M.D.  Encounter Date: 05/14/2015      End of Session - 05/14/15 1132    Visit Number 16   Number of Visits 25   Date for SLP Re-Evaluation 06/01/15   SLP Start Time 1058   SLP Stop Time  1142   SLP Time Calculation (min) 44 min   Activity Tolerance Patient tolerated treatment well      Past Medical History  Diagnosis Date  . Muscle tremor     saw neurology remotely elsewhere, was Rx inderal  . Crohn's ileocolitis ([place])   . Osteopenia     per DEXA 11/2008  . Hypertension 11/11    mild  . Hemorrhoids   . Hypertrophy of prostate     w/o UR obst & oth luts  . Peripheral neuropathy (HCC)     h/o neg w/u  . ED (erectile dysfunction)   . Hypertriglyceridemia   . COPD (chronic obstructive pulmonary disease) ([place])     recent dx--no acute problems  . Hiatal hernia 1956    found while in service  . Anxiety   . Atrial fibrillation with RVR ([place])   . Long term (current) use of anticoagulants 02/21/2011  . Serrated adenoma of colon 03/2010  . Renal cyst     bilateral  . Pulmonary fibrosis ([place]) 2015    Rx Esbriet ~ 10-2013 ([name]), Dr. [name]  . On home oxygen therapy     "2L; 20-24h for the past week" (01/26/2015)  . Emphysema lung ([place]) 2016    Dr. [name]  . B12 deficiency anemia   . Idiopathic pulmonary fibrosis ([place]) 2016    Dr. [name]  . Pneumonia ?2015  . CAP (community acquired pneumonia) 01/26/2015  . Osteoarthritis     "maybe in my hands, feet" (01/26/2015)  . Depression   . Squamous cell carcinoma of skin of scalp     tx'd ~ 64yrs; "froze them off"  . Squamous cell carcinoma, face      "froze them off"  . Melanoma of scalp ([place])   . Stroke [facility]     Past  Surgical History  Procedure Laterality Date  . Colon resection  1984    resection of distal ileum and cecum w/ appendectomy, 18-inch small intestine, for Crohn's disease  . Colon surgery    . Appendectomy  1984  . Cataract extraction w/ intraocular lens  implant, bilateral Bilateral 2007  . Cholecystectomy  02/17/2011    Procedure: LAPAROSCOPIC CHOLECYSTECTOMY WITH INTRAOPERATIVE CHOLANGIOGRAM;  Surgeon: Edward Jolly, MD;  Location: [facility];  Service: General;  Laterality: N/A;  . Mohs surgery Right ~ 2014    "side of my scalp"    There were no vitals filed for this visit.  Visit Diagnosis: Dysphagia  Aphasia following cerebral infarction      Subjective Assessment - 05/14/15 1110    Subjective wife brought frozen ice cream bar in for pt. Melting very fast.                ADULT SLP TREATMENT - 05/14/15 1113    General Information   Behavior/Cognition Alert;Cooperative;Pleasant mood;Requires cueing   Treatment Provided   Treatment provided Dysphagia   Dysphagia Treatment   Temperature Spikes Noted No   Respiratory  Status Nasal cannula   Treatment Methods Skilled observation;Therapeutic exercise   Patient observed directly with PO's Yes   Type of PO's observed Ice chips  ice cream bar   Pharyngeal Phase Signs & Symptoms Wet vocal quality  after first swallow consistently, clear after 2nd   Other treatment/comments NMES placement 3b and mA=10.5 with portions of HEP and ice chips PO, with approx 7 bites of ice cream bar. Melting too quickly to cont wiht ice cream bar and SLP cont wiht ice chips. Consistent wet voice after first swallow but cleared with second swallow. Masako swallows with tongue pumping after 20 minutes. Approx 75% of swallows today were effortful. Pt spontaneously swallowed after wet voice 100%.    Pain Assessment   Pain Assessment No/denies pain            SLP Short Term Goals - 05/01/15 1654    SLP SHORT TERM GOAL #1   Title pt will  demo efforftul swallows 60% of the time in >100 opportunities over 3 sessions   Baseline three sesions 04-26-15   Status Achieved   SLP SHORT TERM GOAL #2   Title pt will throat clear/cough with hydrophonic voice with rare nonverbal cues   Status Achieved   SLP SHORT TERM GOAL #3   Title pt will complete HEP for dysphagia with modified independence (demo cues)   Status Achieved          SLP Long Term Goals - 05/14/15 1141    SLP LONG TERM GOAL #1   Title pt will demo effortful swallows 75% of the time in >120 opportunities over 3 sessions   Status Achieved   SLP LONG TERM GOAL #2   Title pt will complete dysphagia HEP with modified independence over 3 sessions   Baseline one session 05-04-15   Time 3   Period Weeks   Status On-going   SLP LONG TERM GOAL #3   Title pt will clear voice/cough with hydrophonic voice independently 90% of the time   Status Achieved   SLP LONG TERM GOAL #4   Title pt/wife will indicate 3 s/s aspiration PNA   Status Achieved   SLP LONG TERM GOAL #5   Title pt to demo effortful swallow in 85% of swallows up to 25 minutes after NMES placement   Time 2   Period Weeks   Status On-going          Plan - 05/14/15 1132    Clinical Impression Statement Wet voice, as common, after initial swallow and cleared after second swallow.  Tongue pumping began approx 23 minutes into session with 3rd swallow after ice chips, consistently. Pt cont to demo reduced swallow strength and would cont to benefit from skilled ST to cont to maximize swallow function as this is most concerning to pt/family at this time.    Speech Therapy Frequency 3x / week   Duration --  3 weeks   Treatment/Interventions Aspiration precaution training;Pharyngeal strengthening exercises;Diet toleration management by SLP;NMES;Cueing hierarchy;Functional tasks;Patient/family education;Compensatory strategies;SLP instruction and feedback;Language facilitation   Potential to Achieve Goals Fair    Potential Considerations Severity of impairments        Problem List Patient Active Problem List   Diagnosis Date Noted  . Cerebral infarction due to embolism of left middle cerebral artery ([place]) 04/18/2015  . Chronic anticoagulation 04/18/2015  . HLD (hyperlipidemia) 04/18/2015  . Persistent atrial fibrillation ([place]) 04/18/2015  . Gait disturbance, post-stroke 03/29/2015  . Atrial fibrillation with RVR ([place]) 03/13/2015  .  Dysphagia   . Left middle cerebral artery stroke ([place]) 02/02/2015  . Dysphagia, pharyngoesophageal phase 02/02/2015  . Aphasia due to recent cerebrovascular accident 02/02/2015  . Bacterial pneumonia   . Stroke with cerebral ischemia ([place])   . Stroke ([place]) 01/31/2015  . CVA (cerebral infarction) 01/31/2015  . Aphasia   . Hypokalemia   . CAP (community acquired pneumonia) 01/26/2015  . Pulmonary fibrosis ([place]) 01/26/2015  . PCP NOTES >>>>>>>>>>>> 12/14/2014  . Vitamin D deficiency 11/04/2013  . Vasomotor rhinitis 09/22/2013  . Diarrhea 07/08/2013  . Crohn's disease ([place]) 07/08/2013  . Postinflammatory pulmonary fibrosis ([place]) 05/26/2013  . Anxiety state 05/20/2013  . Sprain of shoulder, left 06/01/2012  . Personal history of colonic polyps 04/12/2012  . Pulmonary nodules 03/02/2011  . Cough 03/02/2011  . Long term (current) use of anticoagulants 02/21/2011  . Atrial fibrillation with controlled ventricular response ([place]) 02/17/2011  . Annual physical exam 12/23/2010  . Dyspnea 09/03/2010  . HEMORRHOIDS-INTERNAL 03/08/2010  . [place] INTESTINE 03/08/2010  . RECTAL BLEEDING 03/08/2010  . HEMATOCHEZIA 03/06/2010  . Essential hypertension 02/04/2010  . HYPERTROPHY PROSTATE W/O UR OBST & OTH LUTS 12/10/2009  . Osteopenia 12/07/2009  . PERIPHERAL NEUROPATHY 08/29/2009  . FECAL INCONTINENCE 01/29/2009  . OSTEOARTHRITIS, ANKLE, RIGHT 11/01/2008  . ERECTILE DYSFUNCTION 12/02/2007  . TREMOR 12/02/2007  . CARCINOMA, SKIN, SQUAMOUS  CELL 09/16/2006  . B12 DEFICIENCY 08/27/2006  . HYPERTRIGLYCERIDEMIA 08/27/2006    [place] ,[place], [place]  05/14/2015, 11:50 AM  [facility] [address] [place], [place], [address] Phone: [phone]   Fax:  [phone]   Name: ANUP LORENZ MRN: [id number] Date of Birth: 1929/01/20

## 2015-05-16 ENCOUNTER — Ambulatory Visit: Payer: Medicare Other

## 2015-05-16 DIAGNOSIS — I69320 Aphasia following cerebral infarction: Secondary | ICD-10-CM | POA: Diagnosis not present

## 2015-05-16 DIAGNOSIS — R1310 Dysphagia, unspecified: Secondary | ICD-10-CM | POA: Diagnosis not present

## 2015-05-16 NOTE — Therapy (Signed)
[facility] [address] [place], [place], [address] Phone: [phone]   Fax:  [phone]  Speech Language Pathology Treatment  Patient Details  Name: Brian [name] MRN: [id number] Date of Birth: 09/18/1928 Referring Provider: Rosalin Hawking M.D.  Encounter Date: 05/16/2015      End of Session - 05/16/15 1718    Visit Number 17   Number of Visits 25   Date for SLP Re-Evaluation 06/01/15   SLP Start Time 1147   SLP Stop Time  1230   SLP Time Calculation (min) 43 min   Activity Tolerance Patient tolerated treatment well      Past Medical History  Diagnosis Date  . Muscle tremor     saw neurology remotely elsewhere, was Rx inderal  . Crohn's ileocolitis ([place])   . Osteopenia     per DEXA 11/2008  . Hypertension 11/11    mild  . Hemorrhoids   . Hypertrophy of prostate     w/o UR obst & oth luts  . Peripheral neuropathy (HCC)     h/o neg w/u  . ED (erectile dysfunction)   . Hypertriglyceridemia   . COPD (chronic obstructive pulmonary disease) ([place])     recent dx--no acute problems  . Hiatal hernia 1956    found while in service  . Anxiety   . Atrial fibrillation with RVR ([place])   . Long term (current) use of anticoagulants 02/21/2011  . Serrated adenoma of colon 03/2010  . Renal cyst     bilateral  . Pulmonary fibrosis ([place]) 2015    Rx Esbriet ~ 10-2013 ([name]), Dr. [name]  . On home oxygen therapy     "2L; 20-24h for the past week" (01/26/2015)  . Emphysema lung ([place]) 2016    Dr. [name]  . B12 deficiency anemia   . Idiopathic pulmonary fibrosis ([place]) 2016    Dr. [name]  . Pneumonia ?2015  . CAP (community acquired pneumonia) 01/26/2015  . Osteoarthritis     "maybe in my hands, feet" (01/26/2015)  . Depression   . Squamous cell carcinoma of skin of scalp     tx'd ~ 78yrs; "froze them off"  . Squamous cell carcinoma, face      "froze them off"  . Melanoma of scalp ([place])   . Stroke [facility])     Past  Surgical History  Procedure Laterality Date  . Colon resection  1984    resection of distal ileum and cecum w/ appendectomy, 18-inch small intestine, for Crohn's disease  . Colon surgery    . Appendectomy  1984  . Cataract extraction w/ intraocular lens  implant, bilateral Bilateral 2007  . Cholecystectomy  02/17/2011    Procedure: LAPAROSCOPIC CHOLECYSTECTOMY WITH INTRAOPERATIVE CHOLANGIOGRAM;  Surgeon: Edward Jolly, MD;  Location: [facility];  Service: General;  Laterality: N/A;  . Mohs surgery Right ~ 2014    "side of my scalp"    There were no vitals filed for this visit.  Visit Diagnosis: Dysphagia      Subjective Assessment - 05/16/15 1218    Patient is accompained by: Family member  wife, jean   Currently in Pain? No/denies               ADULT SLP TREATMENT - 05/16/15 1221    General Information   Behavior/Cognition Alert;Cooperative;Pleasant mood;Requires cueing   Treatment Provided   Treatment provided Dysphagia   Dysphagia Treatment   Temperature Spikes Noted No   Respiratory Status Nasal cannula  Treatment Methods Skilled observation;Therapeutic exercise   Patient observed directly with PO's Yes   Type of PO's observed Ice chips   Pharyngeal Phase Signs & Symptoms Wet vocal quality  after 1st swallow, clear after 2nd swallow;cough <5%   Other treatment/comments NMES placement 3a utilized today to strengthen swallowing musculature for improved function and a return to PO diet; mA not greater than10.0. Pt ingested ice chips and popsicle  Pt with tongue pumping noted with Masako, 19 minutes after placement of NMES. Effortful swallow observed 85% today for first 25 minutes.   Assessment / Recommendations / Plan   Plan Continue with current plan of care   Progression Toward Goals   Progression toward goals Progressing toward goals            SLP Short Term Goals - 05/01/15 1654    SLP SHORT TERM GOAL #1   Title pt will demo efforftul swallows 60%  of the time in >100 opportunities over 3 sessions   Baseline three sesions 04-26-15   Status Achieved   SLP SHORT TERM GOAL #2   Title pt will throat clear/cough with hydrophonic voice with rare nonverbal cues   Status Achieved   SLP SHORT TERM GOAL #3   Title pt will complete HEP for dysphagia with modified independence (demo cues)   Status Achieved          SLP Long Term Goals - 05/16/15 1719    SLP LONG TERM GOAL #1   Title pt will demo effortful swallows 75% of the time in >120 opportunities over 3 sessions   Status Achieved   SLP LONG TERM GOAL #2   Title pt will complete dysphagia HEP with modified independence over 3 sessions   Baseline two sessions 05-16-15   Time 3   Period Weeks   Status On-going   SLP LONG TERM GOAL #3   Title pt will clear voice/cough with hydrophonic voice independently 90% of the time   Status Achieved   SLP LONG TERM GOAL #4   Title pt/wife will indicate 3 s/s aspiration PNA   Status Achieved   SLP LONG TERM GOAL #5   Title pt to demo effortful swallow in 85% of swallows up to 25 minutes after NMES placement   Time --   Period --   Status Achieved          Plan - 05/16/15 1718    Clinical Impression Statement After initial swallow consistent wet voice cleared after second swallow.  Tongue pumping occurred rarely after 20 minutes placed with NMES. Pt cont to demo reduced swallow strength and would cont to benefit from skilled ST to cont to maximize swallow function as this is most concerning to pt/family at this time.    Speech Therapy Frequency 3x / week   Duration --  3 weeks   Treatment/Interventions Aspiration precaution training;Pharyngeal strengthening exercises;Diet toleration management by SLP;NMES;Cueing hierarchy;Functional tasks;Patient/family education;Compensatory strategies;SLP instruction and feedback;Language facilitation   Potential to Achieve Goals Fair   Potential Considerations Severity of impairments        Problem  List Patient Active Problem List   Diagnosis Date Noted  . Cerebral infarction due to embolism of left middle cerebral artery ([place]) 04/18/2015  . Chronic anticoagulation 04/18/2015  . HLD (hyperlipidemia) 04/18/2015  . Persistent atrial fibrillation ([place]) 04/18/2015  . Gait disturbance, post-stroke 03/29/2015  . Atrial fibrillation with RVR ([place]) 03/13/2015  . Dysphagia   . Left middle cerebral artery stroke ([place]) 02/02/2015  . Dysphagia,  pharyngoesophageal phase 02/02/2015  . Aphasia due to recent cerebrovascular accident 02/02/2015  . Bacterial pneumonia   . Stroke with cerebral ischemia ([place])   . Stroke ([place]) 01/31/2015  . CVA (cerebral infarction) 01/31/2015  . Aphasia   . Hypokalemia   . CAP (community acquired pneumonia) 01/26/2015  . Pulmonary fibrosis ([place]) 01/26/2015  . PCP NOTES >>>>>>>>>>>> 12/14/2014  . Vitamin D deficiency 11/04/2013  . Vasomotor rhinitis 09/22/2013  . Diarrhea 07/08/2013  . Crohn's disease ([place]) 07/08/2013  . Postinflammatory pulmonary fibrosis ([place]) 05/26/2013  . Anxiety state 05/20/2013  . Sprain of shoulder, left 06/01/2012  . Personal history of colonic polyps 04/12/2012  . Pulmonary nodules 03/02/2011  . Cough 03/02/2011  . Long term (current) use of anticoagulants 02/21/2011  . Atrial fibrillation with controlled ventricular response ([place]) 02/17/2011  . Annual physical exam 12/23/2010  . Dyspnea 09/03/2010  . HEMORRHOIDS-INTERNAL 03/08/2010  . [place] INTESTINE 03/08/2010  . RECTAL BLEEDING 03/08/2010  . HEMATOCHEZIA 03/06/2010  . Essential hypertension 02/04/2010  . HYPERTROPHY PROSTATE W/O UR OBST & OTH LUTS 12/10/2009  . Osteopenia 12/07/2009  . PERIPHERAL NEUROPATHY 08/29/2009  . FECAL INCONTINENCE 01/29/2009  . OSTEOARTHRITIS, ANKLE, RIGHT 11/01/2008  . ERECTILE DYSFUNCTION 12/02/2007  . TREMOR 12/02/2007  . CARCINOMA, SKIN, SQUAMOUS CELL 09/16/2006  . B12 DEFICIENCY 08/27/2006  . HYPERTRIGLYCERIDEMIA  08/27/2006    [place] ,[place], [place]  05/16/2015, 5:22 PM  [facility] [address] [place], [place], [address] Phone: [phone]   Fax:  [phone]   Name: JAMA DILONE MRN: [id number] Date of Birth: 07-04-1928

## 2015-05-18 ENCOUNTER — Ambulatory Visit: Payer: Medicare Other

## 2015-05-18 ENCOUNTER — Ambulatory Visit: Payer: Self-pay | Admitting: Rehabilitation

## 2015-05-18 DIAGNOSIS — R1310 Dysphagia, unspecified: Secondary | ICD-10-CM | POA: Diagnosis not present

## 2015-05-18 DIAGNOSIS — I69320 Aphasia following cerebral infarction: Secondary | ICD-10-CM | POA: Diagnosis not present

## 2015-05-18 NOTE — Therapy (Signed)
[facility] [address] [place], [place], [address] Phone: [phone]   Fax:  [phone]  Speech Language Pathology Treatment  Patient Details  Name: Brian [name] MRN: [id number] Date of Birth: 1928/05/03 Referring Provider: Rosalin Hawking M.D.  Encounter Date: 05/18/2015      End of Session - 05/18/15 1408    Visit Number 18   Number of Visits 25   Date for SLP Re-Evaluation 06/01/15   SLP Start Time 59   SLP Stop Time  1400   SLP Time Calculation (min) 42 min   Activity Tolerance Patient tolerated treatment well      Past Medical History  Diagnosis Date  . Muscle tremor     saw neurology remotely elsewhere, was Rx inderal  . Crohn's ileocolitis ([place])   . Osteopenia     per DEXA 11/2008  . Hypertension 11/11    mild  . Hemorrhoids   . Hypertrophy of prostate     w/o UR obst & oth luts  . Peripheral neuropathy (HCC)     h/o neg w/u  . ED (erectile dysfunction)   . Hypertriglyceridemia   . COPD (chronic obstructive pulmonary disease) ([place])     recent dx--no acute problems  . Hiatal hernia 1956    found while in service  . Anxiety   . Atrial fibrillation with RVR ([place])   . Long term (current) use of anticoagulants 02/21/2011  . Serrated adenoma of colon 03/2010  . Renal cyst     bilateral  . Pulmonary fibrosis ([place]) 2015    Rx Esbriet ~ 10-2013 ([name]), Dr. [name]  . On home oxygen therapy     "2L; 20-24h for the past week" (01/26/2015)  . Emphysema lung ([place]) 2016    Dr. [name]  . B12 deficiency anemia   . Idiopathic pulmonary fibrosis ([place]) 2016    Dr. [name]  . Pneumonia ?2015  . CAP (community acquired pneumonia) 01/26/2015  . Osteoarthritis     "maybe in my hands, feet" (01/26/2015)  . Depression   . Squamous cell carcinoma of skin of scalp     tx'd ~ 57yrs; "froze them off"  . Squamous cell carcinoma, face      "froze them off"  . Melanoma of scalp ([place])   . Stroke [facility])     Past  Surgical History  Procedure Laterality Date  . Colon resection  1984    resection of distal ileum and cecum w/ appendectomy, 18-inch small intestine, for Crohn's disease  . Colon surgery    . Appendectomy  1984  . Cataract extraction w/ intraocular lens  implant, bilateral Bilateral 2007  . Cholecystectomy  02/17/2011    Procedure: LAPAROSCOPIC CHOLECYSTECTOMY WITH INTRAOPERATIVE CHOLANGIOGRAM;  Surgeon: Edward Jolly, MD;  Location: [facility];  Service: General;  Laterality: N/A;  . Mohs surgery Right ~ 2014    "side of my scalp"    There were no vitals filed for this visit.  Visit Diagnosis: Dysphagia      Subjective Assessment - 05/18/15 1335    Subjective Wife and son accopmanied pt to tx today.   Currently in Pain? No/denies               ADULT SLP TREATMENT - 05/18/15 1336    General Information   Behavior/Cognition Alert;Cooperative;Pleasant mood;Requires cueing   Treatment Provided   Treatment provided Dysphagia   Dysphagia Treatment   Temperature Spikes Noted No   Respiratory Status Nasal cannula  Treatment Methods Skilled observation;Therapeutic exercise   Patient observed directly with PO's Yes   Type of PO's observed Ice chips   Pharyngeal Phase Signs & Symptoms Wet vocal quality   Other treatment/comments NMES used to facilitate stronger swallowing musculature and enable pt better chance of ingestingPO intake safely. Portions of HEP were used along with ice cihps and popsicle along with efforful swallows. Pt's voice was hydrophonic after first swallow consistently, and as session progressed also after second swallow. Voice cleared after subsequent swallows, consstently. Pt with extreme difficulty with [place] at end of therapy session today. Effortful swallow produced approx 85% today.   Pain Assessment   Pain Assessment No/denies pain   Assessment / Recommendations / Plan   Plan Continue with current plan of care   Progression Toward Goals    Progression toward goals Progressing toward goals            SLP Short Term Goals - 05/01/15 1654    SLP SHORT TERM GOAL #1   Title pt will demo efforftul swallows 60% of the time in >100 opportunities over 3 sessions   Baseline three sesions 04-26-15   Status Achieved   SLP SHORT TERM GOAL #2   Title pt will throat clear/cough with hydrophonic voice with rare nonverbal cues   Status Achieved   SLP SHORT TERM GOAL #3   Title pt will complete HEP for dysphagia with modified independence (demo cues)   Status Achieved          SLP Long Term Goals - 05/18/15 1410    SLP LONG TERM GOAL #1   Title pt will demo effortful swallows 75% of the time in >120 opportunities over 3 sessions   Status Achieved   SLP LONG TERM GOAL #2   Title pt will complete dysphagia HEP with modified independence over 3 sessions   Baseline two sessions 05-16-15   Time 3   Period Weeks   Status On-going   SLP LONG TERM GOAL #3   Title pt will clear voice/cough with hydrophonic voice independently 90% of the time   Status Achieved   SLP LONG TERM GOAL #4   Title pt/wife will indicate 3 s/s aspiration PNA   Status Achieved   SLP LONG TERM GOAL #5   Title pt to demo effortful swallow in 85% of swallows up to 25 minutes after NMES placement   Status Achieved   Additional Long Term Goals   Additional Long Term Goals Yes   SLP LONG TERM GOAL #6   Title pt to demo effortful swallow in 90% of swallows in first 30 minutes after NMES placement   Time 2   Period Weeks   Status New          Plan - 05/18/15 1408    Clinical Impression Statement Pt cont to show wet voice after first swallow initially (cleared after second swallow), then after second swallows as session progressed (cleared after third swallow). Pt cont to demo reduced swallow strength and would cont to benefit from skilled ST to cont to maximize swallow function as this is most concerning to pt/family at this time.    Speech Therapy Frequency  3x / week   Duration 2 weeks  3 weeks   Treatment/Interventions Aspiration precaution training;Pharyngeal strengthening exercises;Diet toleration management by SLP;NMES;Cueing hierarchy;Functional tasks;Patient/family education;Compensatory strategies;SLP instruction and feedback;Language facilitation   Potential to Achieve Goals Fair   Potential Considerations Severity of impairments        Problem List Patient  Active Problem List   Diagnosis Date Noted  . Cerebral infarction due to embolism of left middle cerebral artery ([place]) 04/18/2015  . Chronic anticoagulation 04/18/2015  . HLD (hyperlipidemia) 04/18/2015  . Persistent atrial fibrillation ([place]) 04/18/2015  . Gait disturbance, post-stroke 03/29/2015  . Atrial fibrillation with RVR ([place]) 03/13/2015  . Dysphagia   . Left middle cerebral artery stroke ([place]) 02/02/2015  . Dysphagia, pharyngoesophageal phase 02/02/2015  . Aphasia due to recent cerebrovascular accident 02/02/2015  . Bacterial pneumonia   . Stroke with cerebral ischemia ([place])   . Stroke ([place]) 01/31/2015  . CVA (cerebral infarction) 01/31/2015  . Aphasia   . Hypokalemia   . CAP (community acquired pneumonia) 01/26/2015  . Pulmonary fibrosis ([place]) 01/26/2015  . PCP NOTES >>>>>>>>>>>> 12/14/2014  . Vitamin D deficiency 11/04/2013  . Vasomotor rhinitis 09/22/2013  . Diarrhea 07/08/2013  . Crohn's disease ([place]) 07/08/2013  . Postinflammatory pulmonary fibrosis ([place]) 05/26/2013  . Anxiety state 05/20/2013  . Sprain of shoulder, left 06/01/2012  . Personal history of colonic polyps 04/12/2012  . Pulmonary nodules 03/02/2011  . Cough 03/02/2011  . Long term (current) use of anticoagulants 02/21/2011  . Atrial fibrillation with controlled ventricular response ([place]) 02/17/2011  . Annual physical exam 12/23/2010  . Dyspnea 09/03/2010  . HEMORRHOIDS-INTERNAL 03/08/2010  . [place] INTESTINE 03/08/2010  . RECTAL BLEEDING 03/08/2010  .  HEMATOCHEZIA 03/06/2010  . Essential hypertension 02/04/2010  . HYPERTROPHY PROSTATE W/O UR OBST & OTH LUTS 12/10/2009  . Osteopenia 12/07/2009  . PERIPHERAL NEUROPATHY 08/29/2009  . FECAL INCONTINENCE 01/29/2009  . OSTEOARTHRITIS, ANKLE, RIGHT 11/01/2008  . ERECTILE DYSFUNCTION 12/02/2007  . TREMOR 12/02/2007  . CARCINOMA, SKIN, SQUAMOUS CELL 09/16/2006  . B12 DEFICIENCY 08/27/2006  . HYPERTRIGLYCERIDEMIA 08/27/2006    [place] ,[place], [place]  05/18/2015, 2:11 PM  [facility] [address] [place], [place], [address] Phone: [phone]   Fax:  [phone]   Name: Brian [name] MRN: [id number] Date of Birth: 1929-02-19

## 2015-05-21 ENCOUNTER — Ambulatory Visit: Payer: Medicare Other

## 2015-05-21 DIAGNOSIS — I69320 Aphasia following cerebral infarction: Secondary | ICD-10-CM | POA: Diagnosis not present

## 2015-05-21 DIAGNOSIS — R1310 Dysphagia, unspecified: Secondary | ICD-10-CM

## 2015-05-21 NOTE — Therapy (Signed)
[facility] [address] [place], [place], [address] Phone: [phone]   Fax:  [phone]  Speech Language Pathology Treatment  Patient Details  Name: Brian [name] MRN: [id number] Date of Birth: 09/09/1928 Referring Provider: Rosalin Hawking M.D.  Encounter Date: 05/21/2015      End of Session - 05/21/15 1651    Visit Number 19   Number of Visits 25   Date for SLP Re-Evaluation 06/01/15   SLP Start Time 11   SLP Stop Time  1400   SLP Time Calculation (min) 42 min   Activity Tolerance Patient tolerated treatment well      Past Medical History  Diagnosis Date  . Muscle tremor     saw neurology remotely elsewhere, was Rx inderal  . Crohn's ileocolitis ([place])   . Osteopenia     per DEXA 11/2008  . Hypertension 11/11    mild  . Hemorrhoids   . Hypertrophy of prostate     w/o UR obst & oth luts  . Peripheral neuropathy (HCC)     h/o neg w/u  . ED (erectile dysfunction)   . Hypertriglyceridemia   . COPD (chronic obstructive pulmonary disease) ([place])     recent dx--no acute problems  . Hiatal hernia 1956    found while in service  . Anxiety   . Atrial fibrillation with RVR ([place])   . Long term (current) use of anticoagulants 02/21/2011  . Serrated adenoma of colon 03/2010  . Renal cyst     bilateral  . Pulmonary fibrosis ([place]) 2015    Rx Esbriet ~ 10-2013 ([name]), Dr. [name]  . On home oxygen therapy     "2L; 20-24h for the past week" (01/26/2015)  . Emphysema lung ([place]) 2016    Dr. [name]  . B12 deficiency anemia   . Idiopathic pulmonary fibrosis ([place]) 2016    Dr. [name]  . Pneumonia ?2015  . CAP (community acquired pneumonia) 01/26/2015  . Osteoarthritis     "maybe in my hands, feet" (01/26/2015)  . Depression   . Squamous cell carcinoma of skin of scalp     tx'd ~ 41yrs; "froze them off"  . Squamous cell carcinoma, face      "froze them off"  . Melanoma of scalp ([place])   . Stroke [facility])     Past  Surgical History  Procedure Laterality Date  . Colon resection  1984    resection of distal ileum and cecum w/ appendectomy, 18-inch small intestine, for Crohn's disease  . Colon surgery    . Appendectomy  1984  . Cataract extraction w/ intraocular lens  implant, bilateral Bilateral 2007  . Cholecystectomy  02/17/2011    Procedure: LAPAROSCOPIC CHOLECYSTECTOMY WITH INTRAOPERATIVE CHOLANGIOGRAM;  Surgeon: Edward Jolly, MD;  Location: [facility];  Service: General;  Laterality: N/A;  . Mohs surgery Right ~ 2014    "side of my scalp"    There were no vitals filed for this visit.  Visit Diagnosis: Dysphagia  Aphasia following cerebral infarction      Subjective Assessment - 05/21/15 1328    Subjective Pt practiced his HEP wthout wife this weekend.   Patient is accompained by: Family member  wife   Currently in Pain? No/denies               ADULT SLP TREATMENT - 05/21/15 1331    General Information   Behavior/Cognition Alert;Cooperative;Pleasant mood;Requires cueing   Treatment Provided   Treatment provided Dysphagia  Dysphagia Treatment   Temperature Spikes Noted No   Respiratory Status Nasal cannula   Treatment Methods Skilled observation;Therapeutic exercise   Patient observed directly with PO's Yes   Type of PO's observed Ice chips  popsicle   Pharyngeal Phase Signs & Symptoms Wet vocal quality  after 7 minutes of placement with NMES   Other treatment/comments In order to target pt's weakened swallowing musculature NMES was used to improve muscle strength for swallowing. Pt without hyrophonic voice after first swallow for first approx 6 minutes after NMES placed. Setting 3 and mA=10.0 today. After that, wet voice consistnetly after first swallow and eventually after second swallow as well. Cleared by third swallows.  Tongue pumping observed after first swallow rarely in last 12 minutes of therapy.   Assessment / Recommendations / Plan   Plan Continue with  current plan of care   Progression Toward Goals   Progression toward goals Progressing toward goals            SLP Short Term Goals - 05/01/15 1654    SLP SHORT TERM GOAL #1   Title pt will demo efforftul swallows 60% of the time in >100 opportunities over 3 sessions   Baseline three sesions 04-26-15   Status Achieved   SLP SHORT TERM GOAL #2   Title pt will throat clear/cough with hydrophonic voice with rare nonverbal cues   Status Achieved   SLP SHORT TERM GOAL #3   Title pt will complete HEP for dysphagia with modified independence (demo cues)   Status Achieved          SLP Long Term Goals - 05/21/15 1654    SLP LONG TERM GOAL #1   Title pt will demo effortful swallows 75% of the time in >120 opportunities over 3 sessions   Status Achieved   SLP LONG TERM GOAL #2   Title pt will complete dysphagia HEP with modified independence over 3 sessions   Baseline two sessions 05-16-15   Time 1   Period Weeks   Status On-going   SLP LONG TERM GOAL #3   Title pt will clear voice/cough with hydrophonic voice independently 90% of the time   Status Achieved   SLP LONG TERM GOAL #4   Title pt/wife will indicate 3 s/s aspiration PNA   Status Achieved   SLP LONG TERM GOAL #5   Title pt to demo effortful swallow in 85% of swallows up to 25 minutes after NMES placement   Status Achieved   SLP LONG TERM GOAL #6   Title pt to demo effortful swallow in 90% of swallows in first 30 minutes after NMES placement   Time 1   Period Weeks   Status On-going          Plan - 05/21/15 1653    Clinical Impression Statement Pt showed wet voice after first swallow initially (cleared after second swallow), then after second swallows as session progressed (cleared after third swallow). However this only began after first 6 minutes of tx. Pt cont to demo reduced swallow strength and would cont to benefit from skilled ST to cont to maximize swallow function as this is most concerning to pt/family at  this time. Receptive and expressive aphasia as well as oral nonverbal apraxia also cont to appear in pt's conversation.   Speech Therapy Frequency 3x / week   Duration 2 weeks  3 weeks   Treatment/Interventions Aspiration precaution training;Pharyngeal strengthening exercises;Diet toleration management by SLP;NMES;Cueing hierarchy;Functional tasks;Patient/family education;Compensatory strategies;SLP instruction and  feedback;Language facilitation   Potential to Achieve Goals Fair   Potential Considerations Severity of impairments        Problem List Patient Active Problem List   Diagnosis Date Noted  . Cerebral infarction due to embolism of left middle cerebral artery ([place]) 04/18/2015  . Chronic anticoagulation 04/18/2015  . HLD (hyperlipidemia) 04/18/2015  . Persistent atrial fibrillation ([place]) 04/18/2015  . Gait disturbance, post-stroke 03/29/2015  . Atrial fibrillation with RVR ([place]) 03/13/2015  . Dysphagia   . Left middle cerebral artery stroke ([place]) 02/02/2015  . Dysphagia, pharyngoesophageal phase 02/02/2015  . Aphasia due to recent cerebrovascular accident 02/02/2015  . Bacterial pneumonia   . Stroke with cerebral ischemia ([place])   . Stroke ([place]) 01/31/2015  . CVA (cerebral infarction) 01/31/2015  . Aphasia   . Hypokalemia   . CAP (community acquired pneumonia) 01/26/2015  . Pulmonary fibrosis ([place]) 01/26/2015  . PCP NOTES >>>>>>>>>>>> 12/14/2014  . Vitamin D deficiency 11/04/2013  . Vasomotor rhinitis 09/22/2013  . Diarrhea 07/08/2013  . Crohn's disease ([place]) 07/08/2013  . Postinflammatory pulmonary fibrosis ([place]) 05/26/2013  . Anxiety state 05/20/2013  . Sprain of shoulder, left 06/01/2012  . Personal history of colonic polyps 04/12/2012  . Pulmonary nodules 03/02/2011  . Cough 03/02/2011  . Long term (current) use of anticoagulants 02/21/2011  . Atrial fibrillation with controlled ventricular response ([place]) 02/17/2011  . Annual physical exam 12/23/2010  .  Dyspnea 09/03/2010  . HEMORRHOIDS-INTERNAL 03/08/2010  . [place] INTESTINE 03/08/2010  . RECTAL BLEEDING 03/08/2010  . HEMATOCHEZIA 03/06/2010  . Essential hypertension 02/04/2010  . HYPERTROPHY PROSTATE W/O UR OBST & OTH LUTS 12/10/2009  . Osteopenia 12/07/2009  . PERIPHERAL NEUROPATHY 08/29/2009  . FECAL INCONTINENCE 01/29/2009  . OSTEOARTHRITIS, ANKLE, RIGHT 11/01/2008  . ERECTILE DYSFUNCTION 12/02/2007  . TREMOR 12/02/2007  . CARCINOMA, SKIN, SQUAMOUS CELL 09/16/2006  . B12 DEFICIENCY 08/27/2006  . HYPERTRIGLYCERIDEMIA 08/27/2006    [facility] ,[place], [place]  05/21/2015, 4:57 PM  [facility] [address] [place], [place], [address] Phone: [phone]   Fax:  [phone]   Name: Brian [name] MRN: [id number] Date of Birth: 1928/04/23

## 2015-05-23 ENCOUNTER — Ambulatory Visit: Payer: Medicare Other | Attending: Neurology

## 2015-05-23 DIAGNOSIS — I69320 Aphasia following cerebral infarction: Secondary | ICD-10-CM | POA: Diagnosis not present

## 2015-05-23 DIAGNOSIS — R1310 Dysphagia, unspecified: Secondary | ICD-10-CM | POA: Diagnosis not present

## 2015-05-23 NOTE — Patient Instructions (Signed)
Press your tongue hard against the roof of your mouth for three seconds. Repeat 15-20 times, 2-3 times per day.

## 2015-05-23 NOTE — Therapy (Signed)
[name] [address] [place], [place], [address] Phone: [phone]   Fax:  [phone]  Speech Language Pathology Treatment  Patient Details  Name: Brian [name] MRN: [id number] Date of Birth: Nov 18, 1928 Referring Provider: Rosalin Hawking M.D.  Encounter Date: 05/23/2015      End of Session - 05/23/15 1434    Visit Number 20   Number of Visits 25   Date for SLP Re-Evaluation 06/01/15   SLP Start Time 1320   SLP Stop Time  1401   SLP Time Calculation (min) 41 min   Activity Tolerance Patient tolerated treatment well      Past Medical History  Diagnosis Date  . Muscle tremor     saw neurology remotely elsewhere, was Rx inderal  . Crohn's ileocolitis ([place])   . Osteopenia     per DEXA 11/2008  . Hypertension 11/11    mild  . Hemorrhoids   . Hypertrophy of prostate     w/o UR obst & oth luts  . Peripheral neuropathy (HCC)     h/o neg w/u  . ED (erectile dysfunction)   . Hypertriglyceridemia   . COPD (chronic obstructive pulmonary disease) ([place])     recent dx--no acute problems  . Hiatal hernia 1956    found while in service  . Anxiety   . Atrial fibrillation with RVR ([place])   . Long term (current) use of anticoagulants 02/21/2011  . Serrated adenoma of colon 03/2010  . Renal cyst     bilateral  . Pulmonary fibrosis ([place]) 2015    Rx Esbriet ~ 10-2013 ([name]), Dr. [name]  . On home oxygen therapy     "2L; 20-24h for the past week" (01/26/2015)  . Emphysema lung ([place]) 2016    Dr. [name]  . B12 deficiency anemia   . Idiopathic pulmonary fibrosis ([place]) 2016    Dr. [name]  . Pneumonia ?2015  . CAP (community acquired pneumonia) 01/26/2015  . Osteoarthritis     "maybe in my hands, feet" (01/26/2015)  . Depression   . Squamous cell carcinoma of skin of scalp     tx'd ~ 47yrs; "froze them off"  . Squamous cell carcinoma, face      "froze them off"  . Melanoma of scalp ([place])   . Stroke [facility])     Past  Surgical History  Procedure Laterality Date  . Colon resection  1984    resection of distal ileum and cecum w/ appendectomy, 18-inch small intestine, for Crohn's disease  . Colon surgery    . Appendectomy  1984  . Cataract extraction w/ intraocular lens  implant, bilateral Bilateral 2007  . Cholecystectomy  02/17/2011    Procedure: LAPAROSCOPIC CHOLECYSTECTOMY WITH INTRAOPERATIVE CHOLANGIOGRAM;  Surgeon: Edward Jolly, MD;  Location: [facility];  Service: General;  Laterality: N/A;  . Mohs surgery Right ~ 2014    "side of my scalp"    There were no vitals filed for this visit.  Visit Diagnosis: Dysphagia  Aphasia following cerebral infarction             ADULT SLP TREATMENT - 05/23/15 1335    General Information   Behavior/Cognition Alert;Cooperative;Pleasant mood;Requires cueing   Treatment Provided   Treatment provided Dysphagia   Dysphagia Treatment   Temperature Spikes Noted No   Respiratory Status Nasal cannula   Treatment Methods Skilled observation;Therapeutic exercise   Patient observed directly with PO's Yes   Type of PO's observed Ice chips  popsicle  Pharyngeal Phase Signs & Symptoms Wet vocal quality  after 8 minutes-after first swallow   Other treatment/comments SLP used NMES for swallowing in conjunction with aspects of pt's HEP and ice chips with popsicle and effortful swallow to strengthen swallowing musculature. Pt's consumption incr'd today. After 15 minutes wet voice heard after second swallow as well as first but cleared with subsequent swallow. SLP taught pt/wife tongue press with initial mod-max A, faded cues to min A rarely. SLP instructed pt/wife to perform 15-20 reps with three second hold time. With [place], pt with 3-4 swallows until tongue retracted again. Tongue pumping noted with second, third, and fourth swallows.    Assessment / Recommendations / Plan   Plan Continue with current plan of care   Dysphagia Recommendations   Diet  recommendations NPO  ice chips <30 minutes after oral care   Progression Toward Goals   Progression toward goals Progressing toward goals            SLP Short Term Goals - 05/01/15 1654    SLP SHORT TERM GOAL #1   Title pt will demo efforftul swallows 60% of the time in >100 opportunities over 3 sessions   Baseline three sesions 04-26-15   Status Achieved   SLP SHORT TERM GOAL #2   Title pt will throat clear/cough with hydrophonic voice with rare nonverbal cues   Status Achieved   SLP SHORT TERM GOAL #3   Title pt will complete HEP for dysphagia with modified independence (demo cues)   Status Achieved          SLP Long Term Goals - 05/23/15 1441    SLP LONG TERM GOAL #1   Title pt will demo effortful swallows 75% of the time in >120 opportunities over 3 sessions   Status Achieved   SLP LONG TERM GOAL #2   Title pt will complete dysphagia HEP with modified independence over 3 sessions   Baseline two sessions 05-16-15   Status Achieved   SLP LONG TERM GOAL #3   Title pt will clear voice/cough with hydrophonic voice independently 90% of the time   Status Achieved   SLP LONG TERM GOAL #4   Title pt/wife will indicate 3 s/s aspiration PNA   Status Achieved   SLP LONG TERM GOAL #5   Title pt to demo effortful swallow in 85% of swallows up to 25 minutes after NMES placement   Status Achieved   SLP LONG TERM GOAL #6   Title pt to demo effortful swallow in 90% of swallows in first 30 minutes after NMES placement   Time 1   Period Weeks   Status On-going          Plan - 05/23/15 1435    Clinical Impression Statement Pt showed wet voice after first swallow initially (cleared after second swallow) after 8 minutes after NMES placement, then after second swallows as session progressed (cleared after third swallow). However this only began after first 6 minutes of tx. Pt cont to demo reduced swallow strength and would cont to benefit from skilled ST to cont to maximize swallow  function as this is most concerning to pt/family at this time. Receptive and expressive aphasia as well as oral nonverbal apraxia also cont to appear in pt's conversation.   Speech Therapy Frequency 3x / week   Duration 2 weeks   Treatment/Interventions Aspiration precaution training;Pharyngeal strengthening exercises;Diet toleration management by SLP;NMES;Cueing hierarchy;Functional tasks;Patient/family education;Compensatory strategies;SLP instruction and feedback;Language facilitation   Potential to Achieve Goals  Fair   Potential Considerations Severity of impairments          G-Codes - 05/30/15 1441    Functional Assessment Tool Used modfied barium swallow exam report   Functional Limitations Swallowing   Swallow Current Status [id number]) At least 80 percent but less than 100 percent impaired, limited or restricted   Swallow Goal Status [id number]) At least 60 percent but less than 80 percent impaired, limited or restricted                                                       SPEECH THERAPY PROGRESS NOTE  Subjective Statement: Pt has been seen for 20 sessions targeting swallowing skills using ice chips and popsicle bites. NMES for swallowing has been used with these PO items as well as pt's HEP.  Objective Measurements: Pt's swallowing skills cont to demo weakness as pt exhibits tongue pumping <25 minutes into session, and exhibits wet voice following initial swallow usually. No overt s/s aspiration PNA have occurred during this reporting period.  Goal Update: see above goal summary  Plan: See "plan" above.  Reason Skilled Services are Required: Pt cont to exhibit reduced swallow strength and is not yet safe for regular PO intake.   Problem List Patient Active Problem List   Diagnosis Date Noted  . Cerebral infarction due to embolism of left middle cerebral artery ([place]) 04/18/2015  . Chronic anticoagulation 04/18/2015  . HLD (hyperlipidemia) 04/18/2015  . Persistent atrial  fibrillation ([place]) 04/18/2015  . Gait disturbance, post-stroke 03/29/2015  . Atrial fibrillation with RVR ([place]) 03/13/2015  . Dysphagia   . Left middle cerebral artery stroke ([place]) 02/02/2015  . Dysphagia, pharyngoesophageal phase 02/02/2015  . Aphasia due to recent cerebrovascular accident 02/02/2015  . Bacterial pneumonia   . Stroke with cerebral ischemia ([place])   . Stroke ([place]) 01/31/2015  . CVA (cerebral infarction) 01/31/2015  . Aphasia   . Hypokalemia   . CAP (community acquired pneumonia) 01/26/2015  . Pulmonary fibrosis ([place]) 01/26/2015  . PCP NOTES >>>>>>>>>>>> 12/14/2014  . Vitamin D deficiency 11/04/2013  . Vasomotor rhinitis 09/22/2013  . Diarrhea 07/08/2013  . Crohn's disease ([place]) 07/08/2013  . Postinflammatory pulmonary fibrosis ([place]) 05/26/2013  . Anxiety state 05/20/2013  . Sprain of shoulder, left 06/01/2012  . Personal history of colonic polyps 04/12/2012  . Pulmonary nodules 03/02/2011  . Cough 03/02/2011  . Long term (current) use of anticoagulants 02/21/2011  . Atrial fibrillation with controlled ventricular response ([place]) 02/17/2011  . Annual physical exam 12/23/2010  . Dyspnea 09/03/2010  . HEMORRHOIDS-INTERNAL 03/08/2010  . [employer] INTESTINE 03/08/2010  . RECTAL BLEEDING 03/08/2010  . HEMATOCHEZIA 03/06/2010  . Essential hypertension 02/04/2010  . HYPERTROPHY PROSTATE W/O UR OBST & OTH LUTS 12/10/2009  . Osteopenia 12/07/2009  . PERIPHERAL NEUROPATHY 08/29/2009  . FECAL INCONTINENCE 01/29/2009  . OSTEOARTHRITIS, ANKLE, RIGHT 11/01/2008  . ERECTILE DYSFUNCTION 12/02/2007  . TREMOR 12/02/2007  . CARCINOMA, SKIN, SQUAMOUS CELL 09/16/2006  . B12 DEFICIENCY 08/27/2006  . HYPERTRIGLYCERIDEMIA 08/27/2006    [place] ,[place], [place] May 30, 2015, 2:42 PM  [facility] [address] [place], [place], [address] Phone: [phone]   Fax:  [phone]   Name: ADALBERTO HARTSELL MRN: [id number] Date of Birth: 08/06/1928

## 2015-05-24 ENCOUNTER — Ambulatory Visit: Payer: Medicare Other

## 2015-05-24 DIAGNOSIS — I69320 Aphasia following cerebral infarction: Secondary | ICD-10-CM

## 2015-05-24 DIAGNOSIS — R1310 Dysphagia, unspecified: Secondary | ICD-10-CM | POA: Diagnosis not present

## 2015-05-24 NOTE — Therapy (Signed)
[facility] [address] [place], [place], [address] Phone: [phone]   Fax:  [phone]  Speech Language Pathology Treatment  Patient Details  Name: Brian [name] MRN: [id number] Date of Birth: 1929/02/12 Referring Provider: Rosalin Hawking M.D.  Encounter Date: 05/24/2015      End of Session - 05/24/15 1311    Visit Number 21   Number of Visits 25   Date for SLP Re-Evaluation 06/01/15   SLP Start Time 56   SLP Stop Time  1146   SLP Time Calculation (min) 44 min   Activity Tolerance Patient tolerated treatment well      Past Medical History  Diagnosis Date  . Muscle tremor     saw neurology remotely elsewhere, was Rx inderal  . Crohn's ileocolitis ([place])   . Osteopenia     per DEXA 11/2008  . Hypertension 11/11    mild  . Hemorrhoids   . Hypertrophy of prostate     w/o UR obst & oth luts  . Peripheral neuropathy (HCC)     h/o neg w/u  . ED (erectile dysfunction)   . Hypertriglyceridemia   . COPD (chronic obstructive pulmonary disease) ([place])     recent dx--no acute problems  . Hiatal hernia 1956    found while in service  . Anxiety   . Atrial fibrillation with RVR ([place])   . Long term (current) use of anticoagulants 02/21/2011  . Serrated adenoma of colon 03/2010  . Renal cyst     bilateral  . Pulmonary fibrosis ([place]) 2015    Rx Esbriet ~ 10-2013 ([name]), Dr. [name]  . On home oxygen therapy     "2L; 20-24h for the past week" (01/26/2015)  . Emphysema lung ([place]) 2016    Dr. [name]  . B12 deficiency anemia   . Idiopathic pulmonary fibrosis ([place]) 2016    Dr. [name]  . Pneumonia ?2015  . CAP (community acquired pneumonia) 01/26/2015  . Osteoarthritis     "maybe in my hands, feet" (01/26/2015)  . Depression   . Squamous cell carcinoma of skin of scalp     tx'd ~ 59yrs; "froze them off"  . Squamous cell carcinoma, face      "froze them off"  . Melanoma of scalp ([place])   . Stroke [facility])     Past  Surgical History  Procedure Laterality Date  . Colon resection  1984    resection of distal ileum and cecum w/ appendectomy, 18-inch small intestine, for Crohn's disease  . Colon surgery    . Appendectomy  1984  . Cataract extraction w/ intraocular lens  implant, bilateral Bilateral 2007  . Cholecystectomy  02/17/2011    Procedure: LAPAROSCOPIC CHOLECYSTECTOMY WITH INTRAOPERATIVE CHOLANGIOGRAM;  Surgeon: Edward Jolly, MD;  Location: [facility];  Service: General;  Laterality: N/A;  . Mohs surgery Right ~ 2014    "side of my scalp"    There were no vitals filed for this visit.  Visit Diagnosis: Dysphagia  Aphasia following cerebral infarction      Subjective Assessment - 05/24/15 1148    Patient is accompained by: Family member  wife   Currently in Pain? No/denies               ADULT SLP TREATMENT - 05/24/15 1149    General Information   Behavior/Cognition Alert;Cooperative;Pleasant mood;Requires cueing   Treatment Provided   Treatment provided Dysphagia   Dysphagia Treatment   Temperature Spikes Noted No   Respiratory  Status Nasal cannula   Treatment Methods Skilled observation;Therapeutic exercise   Patient observed directly with PO's Yes   Type of PO's observed Ice chips  popsicle   Oral Phase Signs & Symptoms --  none   Pharyngeal Phase Signs & Symptoms Wet vocal quality   Other treatment/comments NMES used to strengthen swallowing musculature. Pt with clear voice up until 11 minutes after placement of NMES (3b, with mA=10.0). Pt effortful swallow 85% for first 30 minutes after placement.   Assessment / Recommendations / Plan   Plan Continue with current plan of care   Progression Toward Goals   Progression toward goals Progressing toward goals            SLP Short Term Goals - 05/01/15 1654    SLP SHORT TERM GOAL #1   Title pt will demo efforftul swallows 60% of the time in >100 opportunities over 3 sessions   Baseline three sesions 04-26-15    Status Achieved   SLP SHORT TERM GOAL #2   Title pt will throat clear/cough with hydrophonic voice with rare nonverbal cues   Status Achieved   SLP SHORT TERM GOAL #3   Title pt will complete HEP for dysphagia with modified independence (demo cues)   Status Achieved          SLP Long Term Goals - 05/24/15 1156    SLP LONG TERM GOAL #1   Title pt will demo effortful swallows 75% of the time in >120 opportunities over 3 sessions   Status Achieved   SLP LONG TERM GOAL #2   Title pt will complete dysphagia HEP with modified independence over 3 sessions   Baseline two sessions 05-16-15   Status Achieved   SLP LONG TERM GOAL #3   Title pt will clear voice/cough with hydrophonic voice independently 90% of the time   Status Achieved   SLP LONG TERM GOAL #4   Title pt/wife will indicate 3 s/s aspiration PNA   Status Achieved   SLP LONG TERM GOAL #5   Title pt to demo effortful swallow in 85% of swallows up to 25 minutes after NMES placement   Status Achieved   SLP LONG TERM GOAL #6   Title pt to demo effortful swallow in 90% of swallows in first 30 minutes after NMES placement   Time 1   Period Weeks   Status On-going          Plan - 05/24/15 1153    Clinical Impression Statement Pt showed wet voice after first swallow (cleared after second swallow) after 11 minutes after NMES placement, then after second swallows as session progressed (cleared after third swallow).  Although there are signs of improved swallow strength this week, pt would cont to demo reduced swallow strength and would cont to benefit from skilled ST to cont to maximize swallow function as this is most concerning to pt/family at this time. Receptive and expressive aphasia as well as oral nonverbal apraxia also cont to appear in pt's conversation.   Speech Therapy Frequency 3x / week   Duration 2 weeks   Treatment/Interventions Aspiration precaution training;Pharyngeal strengthening exercises;Diet toleration  management by SLP;NMES;Cueing hierarchy;Functional tasks;Patient/family education;Compensatory strategies;SLP instruction and feedback;Language facilitation   Potential to Achieve Goals Fair   Potential Considerations Severity of impairments          G-Codes - 06/09/2015 1441    Functional Assessment Tool Used modfied barium swallow exam report   Functional Limitations Swallowing   Swallow Current Status [id number])  At least 80 percent but less than 100 percent impaired, limited or restricted   Swallow Goal Status [id number]) At least 60 percent but less than 80 percent impaired, limited or restricted      Problem List Patient Active Problem List   Diagnosis Date Noted  . Cerebral infarction due to embolism of left middle cerebral artery ([place]) 04/18/2015  . Chronic anticoagulation 04/18/2015  . HLD (hyperlipidemia) 04/18/2015  . Persistent atrial fibrillation ([place]) 04/18/2015  . Gait disturbance, post-stroke 03/29/2015  . Atrial fibrillation with RVR ([place]) 03/13/2015  . Dysphagia   . Left middle cerebral artery stroke ([place]) 02/02/2015  . Dysphagia, pharyngoesophageal phase 02/02/2015  . Aphasia due to recent cerebrovascular accident 02/02/2015  . Bacterial pneumonia   . Stroke with cerebral ischemia ([place])   . Stroke ([place]) 01/31/2015  . CVA (cerebral infarction) 01/31/2015  . Aphasia   . Hypokalemia   . CAP (community acquired pneumonia) 01/26/2015  . Pulmonary fibrosis ([place]) 01/26/2015  . PCP NOTES >>>>>>>>>>>> 12/14/2014  . Vitamin D deficiency 11/04/2013  . Vasomotor rhinitis 09/22/2013  . Diarrhea 07/08/2013  . Crohn's disease ([place]) 07/08/2013  . Postinflammatory pulmonary fibrosis ([place]) 05/26/2013  . Anxiety state 05/20/2013  . Sprain of shoulder, left 06/01/2012  . Personal history of colonic polyps 04/12/2012  . Pulmonary nodules 03/02/2011  . Cough 03/02/2011  . Long term (current) use of anticoagulants 02/21/2011  . Atrial fibrillation with controlled ventricular  response ([place]) 02/17/2011  . Annual physical exam 12/23/2010  . Dyspnea 09/03/2010  . HEMORRHOIDS-INTERNAL 03/08/2010  . [place] INTESTINE 03/08/2010  . RECTAL BLEEDING 03/08/2010  . HEMATOCHEZIA 03/06/2010  . Essential hypertension 02/04/2010  . HYPERTROPHY PROSTATE W/O UR OBST & OTH LUTS 12/10/2009  . Osteopenia 12/07/2009  . PERIPHERAL NEUROPATHY 08/29/2009  . FECAL INCONTINENCE 01/29/2009  . OSTEOARTHRITIS, ANKLE, RIGHT 11/01/2008  . ERECTILE DYSFUNCTION 12/02/2007  . TREMOR 12/02/2007  . CARCINOMA, SKIN, SQUAMOUS CELL 09/16/2006  . B12 DEFICIENCY 08/27/2006  . HYPERTRIGLYCERIDEMIA 08/27/2006    [place] ,[place], [place]  05/24/2015, 1:12 PM  [facility] [address] [place], [place], [address] Phone: [phone]   Fax:  [phone]   Name: Brian [name] MRN: [id number] Date of Birth: [birth year]

## 2015-05-25 ENCOUNTER — Ambulatory Visit: Payer: Self-pay | Admitting: Physical Medicine & Rehabilitation

## 2015-05-25 ENCOUNTER — Ambulatory Visit: Payer: Self-pay

## 2015-05-25 ENCOUNTER — Ambulatory Visit: Payer: Medicare Other

## 2015-05-29 ENCOUNTER — Ambulatory Visit: Payer: Medicare Other

## 2015-05-29 DIAGNOSIS — R1310 Dysphagia, unspecified: Secondary | ICD-10-CM

## 2015-05-29 DIAGNOSIS — I69320 Aphasia following cerebral infarction: Secondary | ICD-10-CM

## 2015-05-29 NOTE — Therapy (Signed)
[facility] [address] [place], [place], [address] Phone: [phone]   Fax:  [phone]  Speech Language Pathology Treatment  Patient Details  Name: Brian [name] MRN: [id number] Date of Birth: June 10, 1928 Referring Provider: Rosalin Hawking M.D.  Encounter Date: 05/29/2015      End of Session - 05/29/15 1400    Visit Number 22   Number of Visits 25   Date for SLP Re-Evaluation 06/01/15   SLP Start Time 1318   SLP Stop Time  1358   SLP Time Calculation (min) 40 min   Activity Tolerance Patient tolerated treatment well      Past Medical History  Diagnosis Date  . Muscle tremor     saw neurology remotely elsewhere, was Rx inderal  . Crohn's ileocolitis ([place])   . Osteopenia     per DEXA 11/2008  . Hypertension 11/11    mild  . Hemorrhoids   . Hypertrophy of prostate     w/o UR obst & oth luts  . Peripheral neuropathy (HCC)     h/o neg w/u  . ED (erectile dysfunction)   . Hypertriglyceridemia   . COPD (chronic obstructive pulmonary disease) ([place])     recent dx--no acute problems  . Hiatal hernia 1956    found while in service  . Anxiety   . Atrial fibrillation with RVR ([place])   . Long term (current) use of anticoagulants 02/21/2011  . Serrated adenoma of colon 03/2010  . Renal cyst     bilateral  . Pulmonary fibrosis ([place]) 2015    Rx Esbriet ~ 10-2013 ([name]), Dr. [name]  . On home oxygen therapy     "2L; 20-24h for the past week" (01/26/2015)  . Emphysema lung ([place]) 2016    Dr. [name]  . B12 deficiency anemia   . Idiopathic pulmonary fibrosis ([place]) 2016    Dr. [name]  . Pneumonia ?2015  . CAP (community acquired pneumonia) 01/26/2015  . Osteoarthritis     "maybe in my hands, feet" (01/26/2015)  . Depression   . Squamous cell carcinoma of skin of scalp     tx'd ~ 15yrs; "froze them off"  . Squamous cell carcinoma, face      "froze them off"  . Melanoma of scalp ([place])   . Stroke [facility])     Past  Surgical History  Procedure Laterality Date  . Colon resection  1984    resection of distal ileum and cecum w/ appendectomy, 18-inch small intestine, for Crohn's disease  . Colon surgery    . Appendectomy  1984  . Cataract extraction w/ intraocular lens  implant, bilateral Bilateral 2007  . Cholecystectomy  02/17/2011    Procedure: LAPAROSCOPIC CHOLECYSTECTOMY WITH INTRAOPERATIVE CHOLANGIOGRAM;  Surgeon: Edward Jolly, MD;  Location: [facility];  Service: General;  Laterality: N/A;  . Mohs surgery Right ~ 2014    "side of my scalp"    There were no vitals filed for this visit.  Visit Diagnosis: Dysphagia  Aphasia following cerebral infarction      Subjective Assessment - 05/29/15 1330    Subjective "Hi Glendell Docker, how are you?"   Patient is accompained by: --  wife, [name]   Currently in Pain? No/denies               ADULT SLP TREATMENT - 05/29/15 1332    General Information   Behavior/Cognition Alert;Cooperative;Pleasant mood;Requires cueing   Treatment Provided   Treatment provided Dysphagia   Dysphagia Treatment  Temperature Spikes Noted No   Respiratory Status Nasal cannula   Treatment Methods Skilled observation;Therapeutic exercise   Patient observed directly with PO's Yes   Type of PO's observed Ice chips   Oral Phase Signs & Symptoms --  none noted   Pharyngeal Phase Signs & Symptoms Wet vocal quality  50% of the time with min-mod A for hard swallows   Other treatment/comments Placement 3b with mA 10.5 with popsicle, ice chips POs, as well as Masako. Pt with tongue pumping for Masako.    Assessment / Recommendations / Plan   Plan Continue with current plan of care   Dysphagia Recommendations   Diet recommendations NPO  water <30 minutes after oral care   Progression Toward Goals   Progression toward goals Progressing toward goals            SLP Short Term Goals - 05/01/15 1654    SLP SHORT TERM GOAL #1   Title pt will demo efforftul swallows 60%  of the time in >100 opportunities over 3 sessions   Baseline three sesions 04-26-15   Status Achieved   SLP SHORT TERM GOAL #2   Title pt will throat clear/cough with hydrophonic voice with rare nonverbal cues   Status Achieved   SLP SHORT TERM GOAL #3   Title pt will complete HEP for dysphagia with modified independence (demo cues)   Status Achieved          SLP Long Term Goals - 05/29/15 1403    SLP LONG TERM GOAL #1   Title pt will demo effortful swallows 75% of the time in >120 opportunities over 3 sessions   Status Achieved   SLP LONG TERM GOAL #2   Title pt will complete dysphagia HEP with modified independence over 3 sessions   Baseline two sessions 05-16-15   Status Achieved   SLP LONG TERM GOAL #3   Title pt will clear voice/cough with hydrophonic voice independently 90% of the time   Status Achieved   SLP LONG TERM GOAL #4   Title pt/wife will indicate 3 s/s aspiration PNA   Status Achieved   SLP LONG TERM GOAL #5   Title pt to demo effortful swallow in 85% of swallows up to 25 minutes after NMES placement   Status Achieved   SLP LONG TERM GOAL #6   Title pt to demo effortful swallow in 90% of swallows in first 30 minutes after NMES placement   Time 1   Period Weeks   Status On-going          Plan - 05/29/15 1401    Clinical Impression Statement Pt with intermittent wet voice throughout session after 12 minutes, SLP cue for effortful swallow appeared to help reduce this frequency. Pt largely unable to complete Masako after 30 minutes of therapy. He cont to demo reduced swallow strength and would benefit from cont skilled ST utilizing strengthening exercises as well as POs, with the use of NMES.   Speech Therapy Frequency 3x / week   Duration 1 week   Treatment/Interventions Aspiration precaution training;Pharyngeal strengthening exercises;Diet toleration management by SLP;NMES;Cueing hierarchy;Functional tasks;Patient/family education;Compensatory strategies;SLP  instruction and feedback;Language facilitation   Potential to Achieve Goals Fair   Potential Considerations Severity of impairments        Problem List Patient Active Problem List   Diagnosis Date Noted  . Cerebral infarction due to embolism of left middle cerebral artery ([place]) 04/18/2015  . Chronic anticoagulation 04/18/2015  . HLD (hyperlipidemia) 04/18/2015  .  Persistent atrial fibrillation ([place]) 04/18/2015  . Gait disturbance, post-stroke 03/29/2015  . Atrial fibrillation with RVR ([place]) 03/13/2015  . Dysphagia   . Left middle cerebral artery stroke ([place]) 02/02/2015  . Dysphagia, pharyngoesophageal phase 02/02/2015  . Aphasia due to recent cerebrovascular accident 02/02/2015  . Bacterial pneumonia   . Stroke with cerebral ischemia ([place])   . Stroke ([place]) 01/31/2015  . CVA (cerebral infarction) 01/31/2015  . Aphasia   . Hypokalemia   . CAP (community acquired pneumonia) 01/26/2015  . Pulmonary fibrosis ([place]) 01/26/2015  . PCP NOTES >>>>>>>>>>>> 12/14/2014  . Vitamin D deficiency 11/04/2013  . Vasomotor rhinitis 09/22/2013  . Diarrhea 07/08/2013  . Crohn's disease ([place]) 07/08/2013  . Postinflammatory pulmonary fibrosis ([place]) 05/26/2013  . Anxiety state 05/20/2013  . Sprain of shoulder, left 06/01/2012  . Personal history of colonic polyps 04/12/2012  . Pulmonary nodules 03/02/2011  . Cough 03/02/2011  . Long term (current) use of anticoagulants 02/21/2011  . Atrial fibrillation with controlled ventricular response ([place]) 02/17/2011  . Annual physical exam 12/23/2010  . Dyspnea 09/03/2010  . HEMORRHOIDS-INTERNAL 03/08/2010  . [place] INTESTINE 03/08/2010  . RECTAL BLEEDING 03/08/2010  . HEMATOCHEZIA 03/06/2010  . Essential hypertension 02/04/2010  . HYPERTROPHY PROSTATE W/O UR OBST & OTH LUTS 12/10/2009  . Osteopenia 12/07/2009  . PERIPHERAL NEUROPATHY 08/29/2009  . FECAL INCONTINENCE 01/29/2009  . OSTEOARTHRITIS, ANKLE, RIGHT 11/01/2008  .  ERECTILE DYSFUNCTION 12/02/2007  . TREMOR 12/02/2007  . CARCINOMA, SKIN, SQUAMOUS CELL 09/16/2006  . B12 DEFICIENCY 08/27/2006  . HYPERTRIGLYCERIDEMIA 08/27/2006    [place] ,[place], [place]  05/29/2015, 2:04 PM  [facility] [address] [place], [place], [address] Phone: [phone]   Fax:  [phone]   Name: Brian [name] MRN: [id number] Date of Birth: Aug 28, 1928

## 2015-05-30 ENCOUNTER — Ambulatory Visit: Payer: Medicare Other

## 2015-05-30 DIAGNOSIS — I69320 Aphasia following cerebral infarction: Secondary | ICD-10-CM

## 2015-05-30 DIAGNOSIS — R1310 Dysphagia, unspecified: Secondary | ICD-10-CM | POA: Diagnosis not present

## 2015-05-30 NOTE — Therapy (Signed)
[facility] [address] [place], [place], [address] Phone: [phone]   Fax:  [phone]  Speech Language Pathology Treatment  Patient Details  Name: Brian [name] MRN: [id number] Date of Birth: [birth year] Referring Provider: Rosalin Hawking M.D.  Encounter Date: 05/30/2015      End of Session - 05/30/15 1434    Visit Number 23   Number of Visits 25   Date for SLP Re-Evaluation 06/01/15   SLP Start Time 1319   SLP Stop Time  1400   SLP Time Calculation (min) 41 min   Activity Tolerance Patient tolerated treatment well      Past Medical History  Diagnosis Date  . Muscle tremor     saw neurology remotely elsewhere, was Rx inderal  . Crohn's ileocolitis ([place])   . Osteopenia     per DEXA 11/2008  . Hypertension 11/11    mild  . Hemorrhoids   . Hypertrophy of prostate     w/o UR obst & oth luts  . Peripheral neuropathy (HCC)     h/o neg w/u  . ED (erectile dysfunction)   . Hypertriglyceridemia   . COPD (chronic obstructive pulmonary disease) ([place])     recent dx--no acute problems  . Hiatal hernia 1956    found while in service  . Anxiety   . Atrial fibrillation with RVR ([place])   . Long term (current) use of anticoagulants 02/21/2011  . Serrated adenoma of colon 03/2010  . Renal cyst     bilateral  . Pulmonary fibrosis ([place]) 2015    Rx Esbriet ~ 10-2013 ([name]), Dr. [name]  . On home oxygen therapy     "2L; 20-24h for the past week" (01/26/2015)  . Emphysema lung ([place]) 2016    Dr. [name]  . B12 deficiency anemia   . Idiopathic pulmonary fibrosis ([place]) 2016    Dr. [name]  . Pneumonia ?2015  . CAP (community acquired pneumonia) 01/26/2015  . Osteoarthritis     "maybe in my hands, feet" (01/26/2015)  . Depression   . Squamous cell carcinoma of skin of scalp     tx'd ~ 4yrs; "froze them off"  . Squamous cell carcinoma, face      "froze them off"  . Melanoma of scalp ([place])   . Stroke [facility])     Past  Surgical History  Procedure Laterality Date  . Colon resection  1984    resection of distal ileum and cecum w/ appendectomy, 18-inch small intestine, for Crohn's disease  . Colon surgery    . Appendectomy  1984  . Cataract extraction w/ intraocular lens  implant, bilateral Bilateral 2007  . Cholecystectomy  02/17/2011    Procedure: LAPAROSCOPIC CHOLECYSTECTOMY WITH INTRAOPERATIVE CHOLANGIOGRAM;  Surgeon: Edward Jolly, MD;  Location: [facility];  Service: General;  Laterality: N/A;  . Mohs surgery Right ~ 2014    "side of my scalp"    There were no vitals filed for this visit.  Visit Diagnosis: Dysphagia  Aphasia following cerebral infarction      Subjective Assessment - 05/30/15 1327    Patient is accompained by: Family member  wife   Currently in Pain? No/denies               ADULT SLP TREATMENT - 05/30/15 1330    General Information   Behavior/Cognition Alert;Cooperative;Pleasant mood;Requires cueing   Treatment Provided   Treatment provided Dysphagia   Dysphagia Treatment   Temperature Spikes Noted No   Respiratory  Status Nasal cannula   Treatment Methods Skilled observation;Therapeutic exercise   Patient observed directly with PO's Yes   Type of PO's observed Ice chips;Dysphagia 1 (puree)  popsicle   Oral Phase Signs & Symptoms --  nothing noted   Pharyngeal Phase Signs & Symptoms Wet vocal quality   Other treatment/comments Placement 3a with mA=10.0 with effortful swalows and ice chips and popsicle.With applesauce trials (1/2 teaspoon), pt exhibited wet voice 33/5 (2/6 boluses) with spontaneous cough once (after SLP cued pt to cough/reswallow first time). With popsicle and ice chips, pt with effortful swallow 85% of the time and more frequent wet voice after fist swallow (cleared with second) after 20 minutes of placement with NMES. Pt noted wet voice approx 65% of the time.(4/6 times).   Assessment / Recommendations / Plan   Plan Continue with current plan  of care   Dysphagia Recommendations   Diet recommendations NPO  water <30 minutes after oral care   Progression Toward Goals   Progression toward goals Progressing toward goals            SLP Short Term Goals - 05/01/15 1654    SLP SHORT TERM GOAL #1   Title pt will demo efforftul swallows 60% of the time in >100 opportunities over 3 sessions   Baseline three sesions 04-26-15   Status Achieved   SLP SHORT TERM GOAL #2   Title pt will throat clear/cough with hydrophonic voice with rare nonverbal cues   Status Achieved   SLP SHORT TERM GOAL #3   Title pt will complete HEP for dysphagia with modified independence (demo cues)   Status Achieved          SLP Long Term Goals - 05/30/15 1436    SLP LONG TERM GOAL #1   Title pt will demo effortful swallows 75% of the time in >120 opportunities over 3 sessions   Status Achieved   SLP LONG TERM GOAL #2   Title pt will complete dysphagia HEP with modified independence over 3 sessions   Baseline two sessions 05-16-15   Status Achieved   SLP LONG TERM GOAL #3   Title pt will clear voice/cough with hydrophonic voice independently 90% of the time   Status Achieved   SLP LONG TERM GOAL #4   Title pt/wife will indicate 3 s/s aspiration PNA   Status Achieved   SLP LONG TERM GOAL #5   Title pt to demo effortful swallow in 85% of swallows up to 25 minutes after NMES placement   Status Achieved   SLP LONG TERM GOAL #6   Title pt to demo effortful swallow in 90% of swallows in first 30 minutes after NMES placement   Time 1   Period Weeks   Status On-going          Plan - 05/30/15 1434    Clinical Impression Statement Pt with intermittent wet voice throughout session after 20 minutes. Believe pt may be appropriate for puree-type foods here at clinic with cough afterwards. Pt again largely unable to complete Masako after 30 minutes of therapy. He cont to demo reduced swallow strength and would benefit from cont skilled ST utilizing  strengthening exercises as well as POs, with the use of NMES.   Speech Therapy Frequency 3x / week   Duration 1 week   Treatment/Interventions Aspiration precaution training;Pharyngeal strengthening exercises;Diet toleration management by SLP;NMES;Cueing hierarchy;Functional tasks;Patient/family education;Compensatory strategies;SLP instruction and feedback;Language facilitation   Potential to Achieve Goals Fair   Potential Considerations Severity  of impairments        Problem List Patient Active Problem List   Diagnosis Date Noted  . Cerebral infarction due to embolism of left middle cerebral artery ([place]) 04/18/2015  . Chronic anticoagulation 04/18/2015  . HLD (hyperlipidemia) 04/18/2015  . Persistent atrial fibrillation ([place]) 04/18/2015  . Gait disturbance, post-stroke 03/29/2015  . Atrial fibrillation with RVR ([place]) 03/13/2015  . Dysphagia   . Left middle cerebral artery stroke ([place]) 02/02/2015  . Dysphagia, pharyngoesophageal phase 02/02/2015  . Aphasia due to recent cerebrovascular accident 02/02/2015  . Bacterial pneumonia   . Stroke with cerebral ischemia ([place])   . Stroke ([place]) 01/31/2015  . CVA (cerebral infarction) 01/31/2015  . Aphasia   . Hypokalemia   . CAP (community acquired pneumonia) 01/26/2015  . Pulmonary fibrosis ([place]) 01/26/2015  . PCP NOTES >>>>>>>>>>>> 12/14/2014  . Vitamin D deficiency 11/04/2013  . Vasomotor rhinitis 09/22/2013  . Diarrhea 07/08/2013  . Crohn's disease ([place]) 07/08/2013  . Postinflammatory pulmonary fibrosis ([place]) 05/26/2013  . Anxiety state 05/20/2013  . Sprain of shoulder, left 06/01/2012  . Personal history of colonic polyps 04/12/2012  . Pulmonary nodules 03/02/2011  . Cough 03/02/2011  . Long term (current) use of anticoagulants 02/21/2011  . Atrial fibrillation with controlled ventricular response ([place]) 02/17/2011  . Annual physical exam 12/23/2010  . Dyspnea 09/03/2010  . HEMORRHOIDS-INTERNAL 03/08/2010  . [place] INTESTINE 03/08/2010  . RECTAL BLEEDING 03/08/2010  . HEMATOCHEZIA 03/06/2010  . Essential hypertension 02/04/2010  . HYPERTROPHY PROSTATE W/O UR OBST & OTH LUTS 12/10/2009  . Osteopenia 12/07/2009  . PERIPHERAL NEUROPATHY 08/29/2009  . FECAL INCONTINENCE 01/29/2009  . OSTEOARTHRITIS, ANKLE, RIGHT 11/01/2008  . ERECTILE DYSFUNCTION 12/02/2007  . TREMOR 12/02/2007  . CARCINOMA, SKIN, SQUAMOUS CELL 09/16/2006  . B12 DEFICIENCY 08/27/2006  . HYPERTRIGLYCERIDEMIA 08/27/2006    [facility] ,[place], [place]  05/30/2015, 2:36 PM  [facility] [address] [place], [place], [address] Phone: [phone]   Fax:  [phone]   Name: Brian [name] MRN: [id number] Date of Birth: 1928-05-16

## 2015-05-30 NOTE — Patient Instructions (Signed)
Bring some sweet potato casserole next time - no nuts, please.

## 2015-06-01 ENCOUNTER — Ambulatory Visit: Payer: Medicare Other

## 2015-06-01 DIAGNOSIS — I69320 Aphasia following cerebral infarction: Secondary | ICD-10-CM

## 2015-06-01 DIAGNOSIS — R1310 Dysphagia, unspecified: Secondary | ICD-10-CM | POA: Diagnosis not present

## 2015-06-01 NOTE — Therapy (Signed)
[facility] [address] [place], [place], [address] Phone: [phone]   Fax:  [phone]  Speech Language Pathology Treatment  Patient Details  Name: Brian [name] MRN: [id number] Date of Birth: [birth year] Referring Provider: Rosalin Hawking M.D.  Encounter Date: 06/01/2015      End of Session - 06/01/15 1409    Visit Number 24   Number of Visits 36   Date for SLP Re-Evaluation 06/01/15   SLP Start Time 1316   SLP Stop Time  1400   SLP Time Calculation (min) 44 min   Activity Tolerance Patient tolerated treatment well      Past Medical History  Diagnosis Date  . Muscle tremor     saw neurology remotely elsewhere, was Rx inderal  . Crohn's ileocolitis ([place])   . Osteopenia     per DEXA 11/2008  . Hypertension 11/11    mild  . Hemorrhoids   . Hypertrophy of prostate     w/o UR obst & oth luts  . Peripheral neuropathy (HCC)     h/o neg w/u  . ED (erectile dysfunction)   . Hypertriglyceridemia   . COPD (chronic obstructive pulmonary disease) ([place])     recent dx--no acute problems  . Hiatal hernia 1956    found while in service  . Anxiety   . Atrial fibrillation with RVR ([place])   . Long term (current) use of anticoagulants 02/21/2011  . Serrated adenoma of colon 03/2010  . Renal cyst     bilateral  . Pulmonary fibrosis ([place]) 2015    Rx Esbriet ~ 10-2013 ([name]), Dr. [name]  . On home oxygen therapy     "2L; 20-24h for the past week" (01/26/2015)  . Emphysema lung ([place]) 2016    Dr. [name]  . B12 deficiency anemia   . Idiopathic pulmonary fibrosis ([place]) 2016    Dr. [name]  . Pneumonia ?2015  . CAP (community acquired pneumonia) 01/26/2015  . Osteoarthritis     "maybe in my hands, feet" (01/26/2015)  . Depression   . Squamous cell carcinoma of skin of scalp     tx'd ~ 49yr; "froze them off"  . Squamous cell carcinoma, face      "froze them off"  . Melanoma of scalp ([place]   . Stroke ([facility]     Past  Surgical History  Procedure Laterality Date  . Colon resection  1984    resection of distal ileum and cecum w/ appendectomy, 18-inch small intestine, for Crohn's disease  . Colon surgery    . Appendectomy  1984  . Cataract extraction w/ intraocular lens  implant, bilateral Bilateral 2007  . Cholecystectomy  02/17/2011    Procedure: LAPAROSCOPIC CHOLECYSTECTOMY WITH INTRAOPERATIVE CHOLANGIOGRAM;  Surgeon: BEdward Jolly MD;  Location: [facility];  Service: General;  Laterality: N/A;  . Mohs surgery Right ~ 2014    "side of my scalp"    There were no vitals filed for this visit.  Visit Diagnosis: Dysphagia  Aphasia following cerebral infarction      Subjective Assessment - 06/01/15 1404    Patient is accompained by: Family member  wife               ADULT SLP TREATMENT - 06/01/15 1404    General Information   Behavior/Cognition Alert;Cooperative;Pleasant mood;Requires cueing   Treatment Provided   Treatment provided Dysphagia   Dysphagia Treatment   Temperature Spikes Noted No   Respiratory Status Nasal cannula   Oral  Cavity - Dentition Dentures, top;Dentures, bottom   Treatment Methods Skilled observation;Therapeutic exercise   Patient observed directly with PO's Yes   Type of PO's observed Ice chips;Dysphagia 1 (puree)   Oral Phase Signs & Symptoms --  none   Pharyngeal Phase Signs & Symptoms Wet vocal quality  intermittent last 10 minutes of therapy after 1st swallow   Other treatment/comments NMES was used with placement 3b at mA=10.0 with ice chips and dys I items (applesauce, and slightly thinned down sweet potato casserole filling).  Pt complained of dys I being too "thick" but no overt s/s aspiration with this until last 10 minutes when intermittent wet voice after first swallow cleared with second swallow. Pt with notably stronger swalow (more thyroid elevation and excursion than previously) for approx first 15 minutes of session today. Wife to cont to bring  more dys I items.   Pain Assessment   Pain Assessment No/denies pain   Assessment / Recommendations / Plan   Plan Continue with current plan of care   Dysphagia Recommendations   Diet recommendations NPO  with ice chips <30 minutes after oral care   Progression Toward Goals   Progression toward goals Progressing toward goals            SLP Short Term Goals - 05/01/15 1654    SLP SHORT TERM GOAL #1   Title pt will demo efforftul swallows 60% of the time in >100 opportunities over 3 sessions   Baseline three sesions 04-26-15   Status Achieved   SLP SHORT TERM GOAL #2   Title pt will throat clear/cough with hydrophonic voice with rare nonverbal cues   Status Achieved   SLP SHORT TERM GOAL #3   Title pt will complete HEP for dysphagia with modified independence (demo cues)   Status Achieved          SLP Long Term Goals - 06/01/15 1656    SLP LONG TERM GOAL #1   Title pt will demo effortful swallows 75% of the time in >120 opportunities over 3 sessions   Status Achieved   SLP LONG TERM GOAL #2   Title pt will complete dysphagia HEP with modified independence over 3 sessions   Baseline two sessions 05-16-15   Status Achieved   SLP LONG TERM GOAL #3   Title pt will clear voice/cough with hydrophonic voice independently 90% of the time   Status Achieved   SLP LONG TERM GOAL #4   Title pt/wife will indicate 3 s/s aspiration PNA   Status Achieved   SLP LONG TERM GOAL #5   Title pt to demo effortful swallow in 85% of swallows up to 25 minutes after NMES placement   Status Achieved   Additional Long Term Goals   Additional Long Term Goals Yes   SLP LONG TERM GOAL #6   Title pt to demo effortful swallow in 90% of swallows in first 30 minutes after NMES placement   Time 4   Period Weeks   Status Not Met  Goal to continue for new reporting period (until 06-29-15)   SLP LONG TERM GOAL #7   Title with NMES placed, pt will exhibit wet voice no more than 10% of the time after  initial swallow of POs   Time 4   Period Weeks   Status New          Plan - 06/01/15 1410    Clinical Impression Statement See goal update for details. Pt has made good gains in  8 weeks of dysphagia therapy using NMES paired with ice chips, other POs, and portions of his HEP. He has remained faithful to complete HEP as directed. 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 Pt's intermittent wet voice throughout session has decreased in frequency, now only in last ~15 minutes.  Although with improved swallow strength observed, he cont to demo reduced swallow strength and would benefit from cont skilled ST x3/week utilizing strengthening exercises as well as POs, with the use of NMES.   Speech Therapy Frequency 3x / week   Duration 4 weeks  beginning next week   Treatment/Interventions Aspiration precaution training;Pharyngeal strengthening exercises;Diet toleration management by SLP;NMES;Cueing hierarchy;Functional tasks;Patient/family education;Compensatory strategies;SLP instruction and feedback;Language facilitation   Potential to Achieve Goals Fair   Potential Considerations Severity of impairments        Problem List Patient Active Problem List   Diagnosis Date Noted  . Cerebral infarction due to embolism of left middle cerebral artery ([place]) 04/18/2015  . Chronic anticoagulation 04/18/2015  . HLD (hyperlipidemia) 04/18/2015  . Persistent atrial fibrillation ([place]) 04/18/2015  . Gait disturbance, post-stroke 03/29/2015  . Atrial fibrillation with RVR ([place]) 03/13/2015  . Dysphagia   . Left middle cerebral artery stroke ([place]) 02/02/2015  . Dysphagia, pharyngoesophageal phase 02/02/2015  . Aphasia due to recent cerebrovascular accident 02/02/2015  . Bacterial pneumonia   . Stroke with cerebral ischemia ([place])   . Stroke ([place]) 01/31/2015  . CVA (cerebral infarction) 01/31/2015  . Aphasia   .  Hypokalemia   . CAP (community acquired pneumonia) 01/26/2015  . Pulmonary fibrosis ([place]) 01/26/2015  . PCP NOTES >>>>>>>>>>>> 12/14/2014  . Vitamin D deficiency 11/04/2013  . Vasomotor rhinitis 09/22/2013  . Diarrhea 07/08/2013  . Crohn's disease ([place]) 07/08/2013  . Postinflammatory pulmonary fibrosis ([place]) 05/26/2013  . Anxiety state 05/20/2013  . Sprain of shoulder, left 06/01/2012  . Personal history of colonic polyps 04/12/2012  . Pulmonary nodules 03/02/2011  . Cough 03/02/2011  . Long term (current) use of anticoagulants 02/21/2011  . Atrial fibrillation with controlled ventricular response ([place]) 02/17/2011  . Annual physical exam 12/23/2010  . Dyspnea 09/03/2010  . HEMORRHOIDS-INTERNAL 03/08/2010  . [place] INTESTINE 03/08/2010  . RECTAL BLEEDING 03/08/2010  . HEMATOCHEZIA 03/06/2010  . Essential hypertension 02/04/2010  . HYPERTROPHY PROSTATE W/O UR OBST & OTH LUTS 12/10/2009  . Osteopenia 12/07/2009  . PERIPHERAL NEUROPATHY 08/29/2009  . FECAL INCONTINENCE 01/29/2009  . OSTEOARTHRITIS, ANKLE, RIGHT 11/01/2008  . ERECTILE DYSFUNCTION 12/02/2007  . TREMOR 12/02/2007  . CARCINOMA, SKIN, SQUAMOUS CELL 09/16/2006  . B12 DEFICIENCY 08/27/2006  . HYPERTRIGLYCERIDEMIA 08/27/2006    [place] ,[place], [place]  06/01/2015, 5:04 PM  [facility] [address] [place], [place], [address] Phone: [phone]   Fax:  [phone]   Name: Brian [name] MRN: [id number] Date of Birth: [birth year]

## 2015-06-04 ENCOUNTER — Ambulatory Visit: Payer: Medicare Other

## 2015-06-04 DIAGNOSIS — I69320 Aphasia following cerebral infarction: Secondary | ICD-10-CM

## 2015-06-04 DIAGNOSIS — R1310 Dysphagia, unspecified: Secondary | ICD-10-CM

## 2015-06-04 NOTE — Therapy (Signed)
[facility] [address] [place], [place], [address] Phone: [phone]   Fax:  [phone]  Speech Language Pathology Treatment  Patient Details  Name: Brian [name] MRN: [id number] Date of Birth: [birth year] Referring Provider: Rosalin Hawking M.D.  Encounter Date: 06/04/2015      End of Session - 06/04/15 1401    Visit Number 25   Number of Visits 36   Date for SLP Re-Evaluation 06/01/15   SLP Start Time 1319   SLP Stop Time  1400   SLP Time Calculation (min) 41 min   Activity Tolerance Patient tolerated treatment well      Past Medical History  Diagnosis Date  . Muscle tremor     saw neurology remotely elsewhere, was Rx inderal  . Crohn's ileocolitis ([place])   . Osteopenia     per DEXA 11/2008  . Hypertension 11/11    mild  . Hemorrhoids   . Hypertrophy of prostate     w/o UR obst & oth luts  . Peripheral neuropathy (HCC)     h/o neg w/u  . ED (erectile dysfunction)   . Hypertriglyceridemia   . COPD (chronic obstructive pulmonary disease) ([place])     recent dx--no acute problems  . Hiatal hernia 1956    found while in service  . Anxiety   . Atrial fibrillation with RVR ([place])   . Long term (current) use of anticoagulants 02/21/2011  . Serrated adenoma of colon 03/2010  . Renal cyst     bilateral  . Pulmonary fibrosis ([place]) 2015    Rx Esbriet ~ 10-2013 ([name]), Dr. [name]  . On home oxygen therapy     "2L; 20-24h for the past week" (01/26/2015)  . Emphysema lung ([place]) 2016    Dr. [name]  . B12 deficiency anemia   . Idiopathic pulmonary fibrosis ([place]) 2016    Dr. [name]  . Pneumonia ?2015  . CAP (community acquired pneumonia) 01/26/2015  . Osteoarthritis     "maybe in my hands, feet" (01/26/2015)  . Depression   . Squamous cell carcinoma of skin of scalp     tx'd ~ 40yrs; "froze them off"  . Squamous cell carcinoma, face      "froze them off"  . Melanoma of scalp ([place])   . Stroke [facility])     Past  Surgical History  Procedure Laterality Date  . Colon resection  1984    resection of distal ileum and cecum w/ appendectomy, 18-inch small intestine, for Crohn's disease  . Colon surgery    . Appendectomy  1984  . Cataract extraction w/ intraocular lens  implant, bilateral Bilateral 2007  . Cholecystectomy  02/17/2011    Procedure: LAPAROSCOPIC CHOLECYSTECTOMY WITH INTRAOPERATIVE CHOLANGIOGRAM;  Surgeon: Edward Jolly, MD;  Location: [facility];  Service: General;  Laterality: N/A;  . Mohs surgery Right ~ 2014    "side of my scalp"    There were no vitals filed for this visit.  Visit Diagnosis: Dysphagia  Aphasia following cerebral infarction      Subjective Assessment - 06/04/15 1340    Subjective "Hey bud!"   Patient is accompained by: Family member  wife               ADULT SLP TREATMENT - 06/04/15 1341    General Information   Behavior/Cognition Alert;Cooperative;Pleasant mood;Requires cueing   Treatment Provided   Treatment provided Dysphagia   Dysphagia Treatment   Temperature Spikes Noted No   Respiratory Status  Nasal cannula   Oral Cavity - Dentition Dentures, top;Dentures, bottom   Treatment Methods Skilled observation;Therapeutic exercise   Patient observed directly with PO's Yes   Type of PO's observed Ice chips;Dysphagia 1 (puree)   Oral Phase Signs & Symptoms --  none noted   Pharyngeal Phase Signs & Symptoms Wet vocal quality   Other treatment/comments Ice chips and dys I items tried with NMES and effortful swallow. Portions of pt's HEP also completed during session. Pt's effortful swallow was 80-85% for first 30 minutes. Pt's voice remained clear 90% of the time after first swallow until 23 minutes after NMES placed. Spontaneous swallow with audible wet voice 1/4 today.    Assessment / Recommendations / Plan   Plan Continue with current plan of care   Dysphagia Recommendations   Diet recommendations NPO  ice chips <30 minutes after oral care    Progression Toward Goals   Progression toward goals Progressing toward goals            SLP Short Term Goals - 05/01/15 1654    SLP SHORT TERM GOAL #1   Title pt will demo efforftul swallows 60% of the time in >100 opportunities over 3 sessions   Baseline three sesions 04-26-15   Status Achieved   SLP SHORT TERM GOAL #2   Title pt will throat clear/cough with hydrophonic voice with rare nonverbal cues   Status Achieved   SLP SHORT TERM GOAL #3   Title pt will complete HEP for dysphagia with modified independence (demo cues)   Status Achieved          SLP Long Term Goals - 06/04/15 1402    SLP LONG TERM GOAL #1   Title pt will demo effortful swallows 75% of the time in >120 opportunities over 3 sessions   Status Achieved   SLP LONG TERM GOAL #2   Title pt will complete dysphagia HEP with modified independence over 3 sessions   Baseline two sessions 05-16-15   Status Achieved   SLP LONG TERM GOAL #3   Title pt will clear voice/cough with hydrophonic voice independently 90% of the time   Status Achieved   SLP LONG TERM GOAL #4   Title pt/wife will indicate 3 s/s aspiration PNA   Status Achieved   SLP LONG TERM GOAL #5   Title pt to demo effortful swallow in 85% of swallows up to 25 minutes after NMES placement   Status Achieved   SLP LONG TERM GOAL #6   Title pt to demo effortful swallow in 90% of swallows in first 30 minutes after NMES placement   Time 4   Period Weeks   Status On-going  Goal to continue for new reporting period (until 06-29-15)   SLP LONG TERM GOAL #7   Title with NMES placed, pt will exhibit wet voice no more than 10% of the time after initial swallow of POs   Time 4   Period Weeks   Status On-going          Plan - 06/04/15 1401    Clinical Impression Statement Pt continues with wet voice after initial swallow however after approx 20 minutes placed with NMES. He cont to demo reduced swallow strength and would benefit from cont skilled ST x3/week  utilizing strengthening exercises as well as POs, with the use of NMES.   Speech Therapy Frequency 3x / week   Duration 4 weeks  beginning next week   Treatment/Interventions Aspiration precaution training;Pharyngeal strengthening exercises;Diet toleration  management by SLP;NMES;Cueing hierarchy;Functional tasks;Patient/family education;Compensatory strategies;SLP instruction and feedback;Language facilitation   Potential to Achieve Goals Fair   Potential Considerations Severity of impairments        Problem List Patient Active Problem List   Diagnosis Date Noted  . Cerebral infarction due to embolism of left middle cerebral artery ([place]) 04/18/2015  . Chronic anticoagulation 04/18/2015  . HLD (hyperlipidemia) 04/18/2015  . Persistent atrial fibrillation ([place]) 04/18/2015  . Gait disturbance, post-stroke 03/29/2015  . Atrial fibrillation with RVR ([place]) 03/13/2015  . Dysphagia   . Left middle cerebral artery stroke ([place]) 02/02/2015  . Dysphagia, pharyngoesophageal phase 02/02/2015  . Aphasia due to recent cerebrovascular accident 02/02/2015  . Bacterial pneumonia   . Stroke with cerebral ischemia ([place])   . Stroke ([place]) 01/31/2015  . CVA (cerebral infarction) 01/31/2015  . Aphasia   . Hypokalemia   . CAP (community acquired pneumonia) 01/26/2015  . Pulmonary fibrosis ([place]) 01/26/2015  . PCP NOTES >>>>>>>>>>>> 12/14/2014  . Vitamin D deficiency 11/04/2013  . Vasomotor rhinitis 09/22/2013  . Diarrhea 07/08/2013  . Crohn's disease ([place]) 07/08/2013  . Postinflammatory pulmonary fibrosis ([place]) 05/26/2013  . Anxiety state 05/20/2013  . Sprain of shoulder, left 06/01/2012  . Personal history of colonic polyps 04/12/2012  . Pulmonary nodules 03/02/2011  . Cough 03/02/2011  . Long term (current) use of anticoagulants 02/21/2011  . Atrial fibrillation with controlled ventricular response ([place]) 02/17/2011  . Annual physical exam 12/23/2010  . Dyspnea 09/03/2010  .  HEMORRHOIDS-INTERNAL 03/08/2010  . [place] INTESTINE 03/08/2010  . RECTAL BLEEDING 03/08/2010  . HEMATOCHEZIA 03/06/2010  . Essential hypertension 02/04/2010  . HYPERTROPHY PROSTATE W/O UR OBST & OTH LUTS 12/10/2009  . Osteopenia 12/07/2009  . PERIPHERAL NEUROPATHY 08/29/2009  . FECAL INCONTINENCE 01/29/2009  . OSTEOARTHRITIS, ANKLE, RIGHT 11/01/2008  . ERECTILE DYSFUNCTION 12/02/2007  . TREMOR 12/02/2007  . CARCINOMA, SKIN, SQUAMOUS CELL 09/16/2006  . B12 DEFICIENCY 08/27/2006  . HYPERTRIGLYCERIDEMIA 08/27/2006    [place] ,[place], [place]  06/04/2015, 2:03 PM  [facility] [address] [place], [place], [address] Phone: [phone]   Fax:  [phone]   Name: REYANSH WOLTMAN MRN: [id number] Date of Birth: February 13, 1929

## 2015-06-06 ENCOUNTER — Ambulatory Visit: Payer: Medicare Other

## 2015-06-06 DIAGNOSIS — R1310 Dysphagia, unspecified: Secondary | ICD-10-CM | POA: Diagnosis not present

## 2015-06-06 DIAGNOSIS — I69320 Aphasia following cerebral infarction: Secondary | ICD-10-CM | POA: Diagnosis not present

## 2015-06-06 NOTE — Therapy (Signed)
[facility] [address] [place], [place], [address] Phone: [phone]   Fax:  [phone]  Speech Language Pathology Treatment  Patient Details  Name: Brian [name] MRN: [id number] Date of Birth: 1929/02/09 Referring Provider: Rosalin Hawking M.D.  Encounter Date: 06/06/2015      End of Session - 06/06/15 1403    Visit Number 26   Number of Visits 36   Date for SLP Re-Evaluation 06/01/15   SLP Start Time 18   SLP Stop Time  1400   SLP Time Calculation (min) 42 min   Activity Tolerance Patient tolerated treatment well      Past Medical History  Diagnosis Date  . Muscle tremor     saw neurology remotely elsewhere, was Rx inderal  . Crohn's ileocolitis ([place])   . Osteopenia     per DEXA 11/2008  . Hypertension 11/11    mild  . Hemorrhoids   . Hypertrophy of prostate     w/o UR obst & oth luts  . Peripheral neuropathy (HCC)     h/o neg w/u  . ED (erectile dysfunction)   . Hypertriglyceridemia   . COPD (chronic obstructive pulmonary disease) ([place])     recent dx--no acute problems  . Hiatal hernia 1956    found while in service  . Anxiety   . Atrial fibrillation with RVR ([place])   . Long term (current) use of anticoagulants 02/21/2011  . Serrated adenoma of colon 03/2010  . Renal cyst     bilateral  . Pulmonary fibrosis ([place]) 2015    Rx Esbriet ~ 10-2013 ([name]), Dr. [name]  . On home oxygen therapy     "2L; 20-24h for the past week" (01/26/2015)  . Emphysema lung ([place]) 2016    Dr. [name]  . B12 deficiency anemia   . Idiopathic pulmonary fibrosis ([place]) 2016    Dr. [name]  . Pneumonia ?2015  . CAP (community acquired pneumonia) 01/26/2015  . Osteoarthritis     "maybe in my hands, feet" (01/26/2015)  . Depression   . Squamous cell carcinoma of skin of scalp     tx'd ~ 39yrs; "froze them off"  . Squamous cell carcinoma, face      "froze them off"  . Melanoma of scalp ([place])   . Stroke [facility])     Past  Surgical History  Procedure Laterality Date  . Colon resection  1984    resection of distal ileum and cecum w/ appendectomy, 18-inch small intestine, for Crohn's disease  . Colon surgery    . Appendectomy  1984  . Cataract extraction w/ intraocular lens  implant, bilateral Bilateral 2007  . Cholecystectomy  02/17/2011    Procedure: LAPAROSCOPIC CHOLECYSTECTOMY WITH INTRAOPERATIVE CHOLANGIOGRAM;  Surgeon: Edward Jolly, MD;  Location: [facility];  Service: General;  Laterality: N/A;  . Mohs surgery Right ~ 2014    "side of my scalp"    There were no vitals filed for this visit.  Visit Diagnosis: Dysphagia      Subjective Assessment - 06/06/15 1330    Currently in Pain? No/denies               ADULT SLP TREATMENT - 06/06/15 1331    General Information   Behavior/Cognition Alert;Cooperative;Pleasant mood;Requires cueing   Treatment Provided   Treatment provided Dysphagia   Dysphagia Treatment   Temperature Spikes Noted No   Respiratory Status Nasal cannula   Oral Cavity - Dentition Dentures, top;Dentures, bottom   Treatment  Methods Skilled observation;Therapeutic exercise   Patient observed directly with PO's Yes   Type of PO's observed Ice chips;Dysphagia 1 (puree)   Oral Phase Signs & Symptoms --  none noted   Pharyngeal Phase Signs & Symptoms Wet vocal quality   Other treatment/comments NMES placement 3a used to strengthen pt's swallowing musculature and decr risk of aspiration. mA= 10.0. Pt with wet voice intermittently after first swallow intermittently after 13 minutes, but with occasional cues to swallow hard, frequency decr'd over the remainder of therapy course.Pt with spontaneous second swallow 40% of the time with wet voice. Effortful swallow in first 30 minutes was approx 85%.   Assessment / Recommendations / Plan   Plan Continue with current plan of care   Dysphagia Recommendations   Diet recommendations NPO  ice chips <30 minutes after oral care    Progression Toward Goals   Progression toward goals Progressing toward goals            SLP Short Term Goals - 05/01/15 1654    SLP SHORT TERM GOAL #1   Title pt will demo efforftul swallows 60% of the time in >100 opportunities over 3 sessions   Baseline three sesions 04-26-15   Status Achieved   SLP SHORT TERM GOAL #2   Title pt will throat clear/cough with hydrophonic voice with rare nonverbal cues   Status Achieved   SLP SHORT TERM GOAL #3   Title pt will complete HEP for dysphagia with modified independence (demo cues)   Status Achieved          SLP Long Term Goals - 06/06/15 1403    SLP LONG TERM GOAL #1   Title pt will demo effortful swallows 75% of the time in >120 opportunities over 3 sessions   Status Achieved   SLP LONG TERM GOAL #2   Title pt will complete dysphagia HEP with modified independence over 3 sessions   Baseline two sessions 05-16-15   Status Achieved   SLP LONG TERM GOAL #3   Title pt will clear voice/cough with hydrophonic voice independently 90% of the time   Status Achieved   SLP LONG TERM GOAL #4   Title pt/wife will indicate 3 s/s aspiration PNA   Status Achieved   SLP LONG TERM GOAL #5   Title pt to demo effortful swallow in 85% of swallows up to 25 minutes after NMES placement   Status Achieved   SLP LONG TERM GOAL #6   Title pt to demo effortful swallow in 90% of swallows in first 30 minutes after NMES placement   Time 4   Period Weeks   Status On-going  Goal to continue for new reporting period (until 06-29-15)   SLP LONG TERM GOAL #7   Title with NMES placed, pt will exhibit wet voice no more than 10% of the time after initial swallow of POs   Time 4   Period Weeks   Status On-going          Plan - 06/06/15 1403    Clinical Impression Statement Pt continues with wet voice after initial swallow however after approx 20 minutes placed with NMES. He cont to demo reduced swallow strength and would benefit from cont skilled ST x3/week  utilizing strengthening exercises as well as POs, with the use of NMES.   Speech Therapy Frequency 3x / week   Duration 4 weeks  beginning next week   Treatment/Interventions Aspiration precaution training;Pharyngeal strengthening exercises;Diet toleration management by SLP;NMES;Cueing hierarchy;Functional tasks;Patient/family education;Compensatory  strategies;SLP instruction and feedback;Language facilitation   Potential to Achieve Goals Fair   Potential Considerations Severity of impairments        Problem List Patient Active Problem List   Diagnosis Date Noted  . Cerebral infarction due to embolism of left middle cerebral artery ([place]) 04/18/2015  . Chronic anticoagulation 04/18/2015  . HLD (hyperlipidemia) 04/18/2015  . Persistent atrial fibrillation ([place]) 04/18/2015  . Gait disturbance, post-stroke 03/29/2015  . Atrial fibrillation with RVR ([place]) 03/13/2015  . Dysphagia   . Left middle cerebral artery stroke ([place]) 02/02/2015  . Dysphagia, pharyngoesophageal phase 02/02/2015  . Aphasia due to recent cerebrovascular accident 02/02/2015  . Bacterial pneumonia   . Stroke with cerebral ischemia ([place])   . Stroke ([place]) 01/31/2015  . CVA (cerebral infarction) 01/31/2015  . Aphasia   . Hypokalemia   . CAP (community acquired pneumonia) 01/26/2015  . Pulmonary fibrosis ([place]) 01/26/2015  . PCP NOTES >>>>>>>>>>>> 12/14/2014  . Vitamin D deficiency 11/04/2013  . Vasomotor rhinitis 09/22/2013  . Diarrhea 07/08/2013  . Crohn's disease ([place]) 07/08/2013  . Postinflammatory pulmonary fibrosis ([place]) 05/26/2013  . Anxiety state 05/20/2013  . Sprain of shoulder, left 06/01/2012  . Personal history of colonic polyps 04/12/2012  . Pulmonary nodules 03/02/2011  . Cough 03/02/2011  . Long term (current) use of anticoagulants 02/21/2011  . Atrial fibrillation with controlled ventricular response ([place]) 02/17/2011  . Annual physical exam 12/23/2010  . Dyspnea 09/03/2010  .  HEMORRHOIDS-INTERNAL 03/08/2010  . [place] INTESTINE 03/08/2010  . RECTAL BLEEDING 03/08/2010  . HEMATOCHEZIA 03/06/2010  . Essential hypertension 02/04/2010  . HYPERTROPHY PROSTATE W/O UR OBST & OTH LUTS 12/10/2009  . Osteopenia 12/07/2009  . PERIPHERAL NEUROPATHY 08/29/2009  . FECAL INCONTINENCE 01/29/2009  . OSTEOARTHRITIS, ANKLE, RIGHT 11/01/2008  . ERECTILE DYSFUNCTION 12/02/2007  . TREMOR 12/02/2007  . CARCINOMA, SKIN, SQUAMOUS CELL 09/16/2006  . B12 DEFICIENCY 08/27/2006  . HYPERTRIGLYCERIDEMIA 08/27/2006    [place] ,[place], [place]  06/06/2015, 2:07 PM  [facility] [address] [place], [place], [address] Phone: [phone]   Fax:  [phone]   Name: MICHARL SABAN MRN: [id number] Date of Birth: 1928-11-27

## 2015-06-08 ENCOUNTER — Ambulatory Visit: Payer: Medicare Other

## 2015-06-08 DIAGNOSIS — I69320 Aphasia following cerebral infarction: Secondary | ICD-10-CM | POA: Diagnosis not present

## 2015-06-08 DIAGNOSIS — R1310 Dysphagia, unspecified: Secondary | ICD-10-CM

## 2015-06-08 NOTE — Therapy (Signed)
[facility] [address] [place], [place], [address] Phone: [phone]   Fax:  [phone]  Speech Language Pathology Treatment  Patient Details  Name: Brian [name] MRN: [id number] Date of Birth: 05-27-1928 Referring Provider: Rosalin Hawking M.D.  Encounter Date: 06/08/2015      End of Session - 06/08/15 1403    Visit Number 27   Number of Visits 36   Date for SLP Re-Evaluation 06/01/15   SLP Start Time 1   SLP Stop Time  1400   SLP Time Calculation (min) 42 min   Activity Tolerance Patient tolerated treatment well      Past Medical History  Diagnosis Date  . Muscle tremor     saw neurology remotely elsewhere, was Rx inderal  . Crohn's ileocolitis ([place])   . Osteopenia     per DEXA 11/2008  . Hypertension 11/11    mild  . Hemorrhoids   . Hypertrophy of prostate     w/o UR obst & oth luts  . Peripheral neuropathy (HCC)     h/o neg w/u  . ED (erectile dysfunction)   . Hypertriglyceridemia   . COPD (chronic obstructive pulmonary disease) ([place])     recent dx--no acute problems  . Hiatal hernia 1956    found while in service  . Anxiety   . Atrial fibrillation with RVR ([place])   . Long term (current) use of anticoagulants 02/21/2011  . Serrated adenoma of colon 03/2010  . Renal cyst     bilateral  . Pulmonary fibrosis ([place]) 2015    Rx Esbriet ~ 10-2013 ([name]), Dr. [name]  . On home oxygen therapy     "2L; 20-24h for the past week" (01/26/2015)  . Emphysema lung ([place]) 2016    Dr. [name]  . B12 deficiency anemia   . Idiopathic pulmonary fibrosis ([place]) 2016    Dr. [name]  . Pneumonia ?2015  . CAP (community acquired pneumonia) 01/26/2015  . Osteoarthritis     "maybe in my hands, feet" (01/26/2015)  . Depression   . Squamous cell carcinoma of skin of scalp     tx'd ~ 12yrs; "froze them off"  . Squamous cell carcinoma, face      "froze them off"  . Melanoma of scalp ([place])   . Stroke [facility])     Past  Surgical History  Procedure Laterality Date  . Colon resection  1984    resection of distal ileum and cecum w/ appendectomy, 18-inch small intestine, for Crohn's disease  . Colon surgery    . Appendectomy  1984  . Cataract extraction w/ intraocular lens  implant, bilateral Bilateral 2007  . Cholecystectomy  02/17/2011    Procedure: LAPAROSCOPIC CHOLECYSTECTOMY WITH INTRAOPERATIVE CHOLANGIOGRAM;  Surgeon: Edward Jolly, MD;  Location: [facility];  Service: General;  Laterality: N/A;  . Mohs surgery Right ~ 2014    "side of my scalp"    There were no vitals filed for this visit.  Visit Diagnosis: Dysphagia  Aphasia following cerebral infarction      Subjective Assessment - 06/08/15 1332    Subjective "Hey bud!"   Patient is accompained by: Family member               ADULT SLP TREATMENT - 06/08/15 1346    General Information   Behavior/Cognition Alert;Cooperative;Pleasant mood;Requires cueing   Treatment Provided   Treatment provided Dysphagia   Dysphagia Treatment   Temperature Spikes Noted No   Respiratory Status Nasal cannula  Oral Cavity - Dentition Dentures, top;Dentures, bottom   Treatment Methods Skilled observation;Therapeutic exercise   Patient observed directly with PO's Yes   Type of PO's observed Ice chips;Dysphagia 1 (puree)   Oral Phase Signs & Symptoms --  none noted   Pharyngeal Phase Signs & Symptoms Wet vocal quality  only after approx 30 minutes NMES placed, after first swallo   Other treatment/comments NMES used today in tx for strengthening swallowing musculature with 1/2-3/4 teaspoon puree POs and ice chips with effortful swallow.. Pt without hydrophonic voice after first swallow until 28 minutes after NMES placement! Effortful swallow in first 30 minutes occurred 85-90%   Assessment / Recommendations / Plan   Plan Continue with current plan of care   Progression Toward Goals   Progression toward goals Progressing toward goals             SLP Short Term Goals - 05/01/15 1654    SLP SHORT TERM GOAL #1   Title pt will demo efforftul swallows 60% of the time in >100 opportunities over 3 sessions   Baseline three sesions 04-26-15   Status Achieved   SLP SHORT TERM GOAL #2   Title pt will throat clear/cough with hydrophonic voice with rare nonverbal cues   Status Achieved   SLP SHORT TERM GOAL #3   Title pt will complete HEP for dysphagia with modified independence (demo cues)   Status Achieved          SLP Long Term Goals - 06/08/15 1403    SLP LONG TERM GOAL #1   Title pt will demo effortful swallows 75% of the time in >120 opportunities over 3 sessions   Status Achieved   SLP LONG TERM GOAL #2   Title pt will complete dysphagia HEP with modified independence over 3 sessions   Baseline two sessions 05-16-15   Status Achieved   SLP LONG TERM GOAL #3   Title pt will clear voice/cough with hydrophonic voice independently 90% of the time   Status Achieved   SLP LONG TERM GOAL #4   Title pt/wife will indicate 3 s/s aspiration PNA   Status Achieved   SLP LONG TERM GOAL #5   Title pt to demo effortful swallow in 85% of swallows up to 25 minutes after NMES placement   Status Achieved   SLP LONG TERM GOAL #6   Title pt to demo effortful swallow in 90% of swallows in first 30 minutes after NMES placement   Time 4   Period Weeks   Status On-going  Goal to continue for new reporting period (until 06-29-15)   SLP LONG TERM GOAL #7   Title with NMES placed, pt will exhibit wet voice no more than 10% of the time after initial swallow of POs over three sessions   Time 4   Period Weeks   Status Revised          Plan - 06/08/15 1403    Clinical Impression Statement Pt continues with wet voice after initial swallow however after approx 20 minutes placed with NMES. He cont to demo reduced swallow strength and would benefit from cont skilled ST x3/week utilizing strengthening exercises as well as POs, with the use of NMES.    Speech Therapy Frequency 3x / week   Duration 4 weeks  beginning next week   Treatment/Interventions Aspiration precaution training;Pharyngeal strengthening exercises;Diet toleration management by SLP;NMES;Cueing hierarchy;Functional tasks;Patient/family education;Compensatory strategies;SLP instruction and feedback;Language facilitation   Potential to Achieve Goals Fair   Potential  Considerations Severity of impairments        Problem List Patient Active Problem List   Diagnosis Date Noted  . Cerebral infarction due to embolism of left middle cerebral artery ([place]) 04/18/2015  . Chronic anticoagulation 04/18/2015  . HLD (hyperlipidemia) 04/18/2015  . Persistent atrial fibrillation ([place]) 04/18/2015  . Gait disturbance, post-stroke 03/29/2015  . Atrial fibrillation with RVR ([place]) 03/13/2015  . Dysphagia   . Left middle cerebral artery stroke ([place]) 02/02/2015  . Dysphagia, pharyngoesophageal phase 02/02/2015  . Aphasia due to recent cerebrovascular accident 02/02/2015  . Bacterial pneumonia   . Stroke with cerebral ischemia ([place])   . Stroke ([place]) 01/31/2015  . CVA (cerebral infarction) 01/31/2015  . Aphasia   . Hypokalemia   . CAP (community acquired pneumonia) 01/26/2015  . Pulmonary fibrosis ([place]) 01/26/2015  . PCP NOTES >>>>>>>>>>>> 12/14/2014  . Vitamin D deficiency 11/04/2013  . Vasomotor rhinitis 09/22/2013  . Diarrhea 07/08/2013  . Crohn's disease ([place]) 07/08/2013  . Postinflammatory pulmonary fibrosis ([place]) 05/26/2013  . Anxiety state 05/20/2013  . Sprain of shoulder, left 06/01/2012  . Personal history of colonic polyps 04/12/2012  . Pulmonary nodules 03/02/2011  . Cough 03/02/2011  . Long term (current) use of anticoagulants 02/21/2011  . Atrial fibrillation with controlled ventricular response ([place]) 02/17/2011  . Annual physical exam 12/23/2010  . Dyspnea 09/03/2010  . HEMORRHOIDS-INTERNAL 03/08/2010  . [place] INTESTINE 03/08/2010  .  RECTAL BLEEDING 03/08/2010  . HEMATOCHEZIA 03/06/2010  . Essential hypertension 02/04/2010  . HYPERTROPHY PROSTATE W/O UR OBST & OTH LUTS 12/10/2009  . Osteopenia 12/07/2009  . PERIPHERAL NEUROPATHY 08/29/2009  . FECAL INCONTINENCE 01/29/2009  . OSTEOARTHRITIS, ANKLE, RIGHT 11/01/2008  . ERECTILE DYSFUNCTION 12/02/2007  . TREMOR 12/02/2007  . CARCINOMA, SKIN, SQUAMOUS CELL 09/16/2006  . B12 DEFICIENCY 08/27/2006  . HYPERTRIGLYCERIDEMIA 08/27/2006    [place] ,[place], [place]   06/08/2015, 2:04 PM  [facility] [address] [place], [place], [address] Phone: [phone]   Fax:  [phone]   Name: KEAGON HOFMANN MRN: [id number] Date of Birth: 1928-07-14

## 2015-06-12 ENCOUNTER — Ambulatory Visit: Payer: Medicare Other

## 2015-06-12 DIAGNOSIS — I69320 Aphasia following cerebral infarction: Secondary | ICD-10-CM

## 2015-06-12 DIAGNOSIS — R1310 Dysphagia, unspecified: Secondary | ICD-10-CM | POA: Diagnosis not present

## 2015-06-12 NOTE — Therapy (Signed)
[facility] [address] [place], [place], [address] Phone: [phone]   Fax:  [phone]  Speech Language Pathology Treatment  Patient Details  Name: Brian [name] MRN: [id number] Date of Birth: Oct 30, 1928 Referring Provider: Rosalin Hawking M.D.  Encounter Date: 06/12/2015      End of Session - 06/12/15 1150    Visit Number 28   Number of Visits 36   Date for SLP Re-Evaluation 06/01/15   SLP Start Time 61   SLP Stop Time  1141   SLP Time Calculation (min) 39 min   Activity Tolerance Patient tolerated treatment well      Past Medical History  Diagnosis Date  . Muscle tremor     saw neurology remotely elsewhere, was Rx inderal  . Crohn's ileocolitis ([place])   . Osteopenia     per DEXA 11/2008  . Hypertension 11/11    mild  . Hemorrhoids   . Hypertrophy of prostate     w/o UR obst & oth luts  . Peripheral neuropathy (HCC)     h/o neg w/u  . ED (erectile dysfunction)   . Hypertriglyceridemia   . COPD (chronic obstructive pulmonary disease) ([place])     recent dx--no acute problems  . Hiatal hernia 1956    found while in service  . Anxiety   . Atrial fibrillation with RVR ([place])   . Long term (current) use of anticoagulants 02/21/2011  . Serrated adenoma of colon 03/2010  . Renal cyst     bilateral  . Pulmonary fibrosis ([place]) 2015    Rx Esbriet ~ 10-2013 ([name]), Dr. [name]  . On home oxygen therapy     "2L; 20-24h for the past week" (01/26/2015)  . Emphysema lung ([place]) 2016    Dr. [name]  . B12 deficiency anemia   . Idiopathic pulmonary fibrosis ([place]) 2016    Dr. [name]  . Pneumonia ?2015  . CAP (community acquired pneumonia) 01/26/2015  . Osteoarthritis     "maybe in my hands, feet" (01/26/2015)  . Depression   . Squamous cell carcinoma of skin of scalp     tx'd ~ 30yrs; "froze them off"  . Squamous cell carcinoma, face      "froze them off"  . Melanoma of scalp ([place])   . Stroke [facility])     Past  Surgical History  Procedure Laterality Date  . Colon resection  1984    resection of distal ileum and cecum w/ appendectomy, 18-inch small intestine, for Crohn's disease  . Colon surgery    . Appendectomy  1984  . Cataract extraction w/ intraocular lens  implant, bilateral Bilateral 2007  . Cholecystectomy  02/17/2011    Procedure: LAPAROSCOPIC CHOLECYSTECTOMY WITH INTRAOPERATIVE CHOLANGIOGRAM;  Surgeon: Edward Jolly, MD;  Location: [facility];  Service: General;  Laterality: N/A;  . Mohs surgery Right ~ 2014    "side of my scalp"    There were no vitals filed for this visit.  Visit Diagnosis: Dysphagia  Aphasia following cerebral infarction      Subjective Assessment - 06/12/15 1106    Subjective "Happy day - you're a happy day."   Patient is accompained by: Family member  wife               ADULT SLP TREATMENT - 06/12/15 1107    General Information   Behavior/Cognition Alert;Cooperative;Pleasant mood;Requires cueing   Treatment Provided   Treatment provided Dysphagia   Dysphagia Treatment   Temperature Spikes Noted  No   Respiratory Status Nasal cannula   Oral Cavity - Dentition Dentures, top;Dentures, bottom   Treatment Methods Skilled observation;Therapeutic exercise   Patient observed directly with PO's Yes   Type of PO's observed Ice chips;Dysphagia 1 (puree)   Oral Phase Signs & Symptoms --  none noted   Pharyngeal Phase Signs & Symptoms Wet vocal quality  after 18 minutes, first swallow only-cleared    Other treatment/comments NMES placed with 3a and mA=10.5 in conjunction with dys I food items and ice chips. Pt with more nasal drainage today since waking up, per wife. Hydrophonic voice consistently heard after 18 minutes without cues for effortful swallow. With cues, wet voice reduced approx 50% of the time. Pt noticed hydrophonic voice 50% of the time. Effortful swallow in 30 minutes after NMES placed approx 80% of the time.   Pain Assessment   Pain  Assessment No/denies pain   Assessment / Recommendations / Plan   Plan Continue with current plan of care   Dysphagia Recommendations   Diet recommendations NPO  with ice chips <30 minutes oral care   Progression Toward Goals   Progression toward goals Not progressing toward goals (comment)  more frequent hydrophonic voice- d/t incr'd nasal drainage?            SLP Short Term Goals - 05/01/15 1654    SLP SHORT TERM GOAL #1   Title pt will demo efforftul swallows 60% of the time in >100 opportunities over 3 sessions   Baseline three sesions 04-26-15   Status Achieved   SLP SHORT TERM GOAL #2   Title pt will throat clear/cough with hydrophonic voice with rare nonverbal cues   Status Achieved   SLP SHORT TERM GOAL #3   Title pt will complete HEP for dysphagia with modified independence (demo cues)   Status Achieved          SLP Long Term Goals - 06/12/15 1151    SLP LONG TERM GOAL #1   Title pt will demo effortful swallows 75% of the time in >120 opportunities over 3 sessions   Status Achieved   SLP LONG TERM GOAL #2   Title pt will complete dysphagia HEP with modified independence over 3 sessions   Baseline two sessions 05-16-15   Status Achieved   SLP LONG TERM GOAL #3   Title pt will clear voice/cough with hydrophonic voice independently 90% of the time   Status Achieved   SLP LONG TERM GOAL #4   Title pt/wife will indicate 3 s/s aspiration PNA   Status Achieved   SLP LONG TERM GOAL #5   Title pt to demo effortful swallow in 85% of swallows up to 25 minutes after NMES placement   Status Achieved   SLP LONG TERM GOAL #6   Title pt to demo effortful swallow in 90% of swallows in first 30 minutes after NMES placement   Time 4   Period Weeks   Status On-going  Goal to continue for new reporting period (until 06-29-15)   SLP LONG TERM GOAL #7   Title with NMES placed, pt will exhibit wet voice no more than 10% of the time after initial swallow of POs over three sessions    Baseline second session 06-12-15   Time 4   Period Weeks   Status Revised          Plan - 06/12/15 1151    Clinical Impression Statement Pt continues with wet voice after initial swallow, at approx 18  minutes after being placed with NMES. He cont to demo reduced swallow strength and would benefit from cont skilled ST x3/week utilizing strengthening exercises as well as POs, with the use of NMES.   Speech Therapy Frequency 3x / week   Duration --  3 weeks   Treatment/Interventions Aspiration precaution training;Pharyngeal strengthening exercises;Diet toleration management by SLP;NMES;Cueing hierarchy;Functional tasks;Patient/family education;Compensatory strategies;SLP instruction and feedback;Language facilitation   Potential to Achieve Goals Fair   Potential Considerations Severity of impairments        Problem List Patient Active Problem List   Diagnosis Date Noted  . Cerebral infarction due to embolism of left middle cerebral artery ([place]) 04/18/2015  . Chronic anticoagulation 04/18/2015  . HLD (hyperlipidemia) 04/18/2015  . Persistent atrial fibrillation ([place]) 04/18/2015  . Gait disturbance, post-stroke 03/29/2015  . Atrial fibrillation with RVR ([place]) 03/13/2015  . Dysphagia   . Left middle cerebral artery stroke ([place]) 02/02/2015  . Dysphagia, pharyngoesophageal phase 02/02/2015  . Aphasia due to recent cerebrovascular accident 02/02/2015  . Bacterial pneumonia   . Stroke with cerebral ischemia ([place])   . Stroke ([place]) 01/31/2015  . CVA (cerebral infarction) 01/31/2015  . Aphasia   . Hypokalemia   . CAP (community acquired pneumonia) 01/26/2015  . Pulmonary fibrosis ([place]) 01/26/2015  . PCP NOTES >>>>>>>>>>>> 12/14/2014  . Vitamin D deficiency 11/04/2013  . Vasomotor rhinitis 09/22/2013  . Diarrhea 07/08/2013  . Crohn's disease ([place]) 07/08/2013  . Postinflammatory pulmonary fibrosis ([place]) 05/26/2013  . Anxiety state 05/20/2013  . Sprain of shoulder, left 06/01/2012   . Personal history of colonic polyps 04/12/2012  . Pulmonary nodules 03/02/2011  . Cough 03/02/2011  . Long term (current) use of anticoagulants 02/21/2011  . Atrial fibrillation with controlled ventricular response ([place]) 02/17/2011  . Annual physical exam 12/23/2010  . Dyspnea 09/03/2010  . HEMORRHOIDS-INTERNAL 03/08/2010  . [place] INTESTINE 03/08/2010  . RECTAL BLEEDING 03/08/2010  . HEMATOCHEZIA 03/06/2010  . Essential hypertension 02/04/2010  . HYPERTROPHY PROSTATE W/O UR OBST & OTH LUTS 12/10/2009  . Osteopenia 12/07/2009  . PERIPHERAL NEUROPATHY 08/29/2009  . FECAL INCONTINENCE 01/29/2009  . OSTEOARTHRITIS, ANKLE, RIGHT 11/01/2008  . ERECTILE DYSFUNCTION 12/02/2007  . TREMOR 12/02/2007  . CARCINOMA, SKIN, SQUAMOUS CELL 09/16/2006  . B12 DEFICIENCY 08/27/2006  . HYPERTRIGLYCERIDEMIA 08/27/2006    [facility] ,[place], [place]  06/12/2015, 11:59 AM  [facility] [address] [place], [place], [address] Phone: [phone]   Fax:  [phone]   Name: ILIJAH FRAPPIER MRN: [id number] Date of Birth: April 26, 1928

## 2015-06-13 ENCOUNTER — Ambulatory Visit: Payer: Medicare Other

## 2015-06-13 DIAGNOSIS — I69320 Aphasia following cerebral infarction: Secondary | ICD-10-CM | POA: Diagnosis not present

## 2015-06-13 DIAGNOSIS — R1310 Dysphagia, unspecified: Secondary | ICD-10-CM | POA: Diagnosis not present

## 2015-06-13 NOTE — Therapy (Signed)
[facility] [address] [name], [place], [address] Phone: [phone]   Fax:  [phone]  Speech Language Pathology Treatment  Patient Details  Name: Brian [name] MRN: [id number] Date of Birth: 10-Jul-1928 Referring Provider: Rosalin Hawking M.D.  Encounter Date: 06/13/2015      End of Session - 06/13/15 1734    Visit Number 29   Number of Visits 60   Date for SLP Re-Evaluation 06/01/15   SLP Start Time 1450   SLP Stop Time  [id number]   SLP Time Calculation (min) 40 min   Activity Tolerance Patient tolerated treatment well      Past Medical History  Diagnosis Date  . Muscle tremor     saw neurology remotely elsewhere, was Rx inderal  . Crohn's ileocolitis ([place])   . Osteopenia     per DEXA 11/2008  . Hypertension 11/11    mild  . Hemorrhoids   . Hypertrophy of prostate     w/o UR obst & oth luts  . Peripheral neuropathy (HCC)     h/o neg w/u  . ED (erectile dysfunction)   . Hypertriglyceridemia   . COPD (chronic obstructive pulmonary disease) ([place])     recent dx--no acute problems  . Hiatal hernia 1956    found while in service  . Anxiety   . Atrial fibrillation with RVR ([place])   . Long term (current) use of anticoagulants 02/21/2011  . Serrated adenoma of colon 03/2010  . Renal cyst     bilateral  . Pulmonary fibrosis ([place]) 2015    Rx Esbriet ~ 10-2013 ([name]), Dr. [name]  . On home oxygen therapy     "2L; 20-24h for the past week" (01/26/2015)  . Emphysema lung ([place]) 2016    Dr. [name]  . B12 deficiency anemia   . Idiopathic pulmonary fibrosis ([place]) 2016    Dr. [name]  . Pneumonia ?2015  . CAP (community acquired pneumonia) 01/26/2015  . Osteoarthritis     "maybe in my hands, feet" (01/26/2015)  . Depression   . Squamous cell carcinoma of skin of scalp     tx'd ~ 66yrs; "froze them off"  . Squamous cell carcinoma, face      "froze them off"  . Melanoma of scalp ([place])   . Stroke [facility])     Past  Surgical History  Procedure Laterality Date  . Colon resection  1984    resection of distal ileum and cecum w/ appendectomy, 18-inch small intestine, for Crohn's disease  . Colon surgery    . Appendectomy  1984  . Cataract extraction w/ intraocular lens  implant, bilateral Bilateral 2007  . Cholecystectomy  02/17/2011    Procedure: LAPAROSCOPIC CHOLECYSTECTOMY WITH INTRAOPERATIVE CHOLANGIOGRAM;  Surgeon: Edward Jolly, MD;  Location: [facility];  Service: General;  Laterality: N/A;  . Mohs surgery Right ~ 2014    "side of my scalp"    There were no vitals filed for this visit.  Visit Diagnosis: Dysphagia  Aphasia following cerebral infarction      Subjective Assessment - 06/12/15 1106    Subjective "Happy day - you're a happy day."   Patient is accompained by: Family member  wife               ADULT SLP TREATMENT - 06/13/15 0001    General Information   Behavior/Cognition Alert;Cooperative;Pleasant mood;Requires cueing   Treatment Provided   Treatment provided Dysphagia   Dysphagia Treatment   Temperature Spikes Noted  No   Respiratory Status Nasal cannula   Oral Cavity - Dentition Dentures, top;Dentures, bottom   Treatment Methods Skilled observation;Therapeutic exercise   Patient observed directly with PO's Yes   Type of PO's observed Ice chips;Dysphagia 1 (puree)   Oral Phase Signs & Symptoms --  none noted - possibly spillage of saliva post swallow   Pharyngeal Phase Signs & Symptoms Wet vocal quality  rarely today, noted by patient x3/5   Other treatment/comments Placement 3b of NMES used to strengthen swallowing musculature. mA=10.0. SLP used NMES for 20 minutes and then assesed pt's swallow without NMES to assess feasibility of pt attempting POs of dys I at home. Wife stated she was not comfortable with this at this time, so SLP agreed to repilcate today's session in the next two sessions (NMES off for first 7-8 minutes with dys I POs), then turn NMES on for  remainder of session. This will be done to monitor for s/s aspiration PNA over time.    Assessment / Recommendations / Plan   Plan Continue with current plan of care   Dysphagia Recommendations   Diet recommendations --  ice chips <30 minutes after oral care.   Progression Toward Goals   Progression toward goals Progressing toward goals            SLP Short Term Goals - 05/01/15 1654    SLP SHORT TERM GOAL #1   Title pt will demo efforftul swallows 60% of the time in >100 opportunities over 3 sessions   Baseline three sesions 04-26-15   Status Achieved   SLP SHORT TERM GOAL #2   Title pt will throat clear/cough with hydrophonic voice with rare nonverbal cues   Status Achieved   SLP SHORT TERM GOAL #3   Title pt will complete HEP for dysphagia with modified independence (demo cues)   Status Achieved          SLP Long Term Goals - 06/13/15 1734    SLP LONG TERM GOAL #1   Title pt will demo effortful swallows 75% of the time in >120 opportunities over 3 sessions   Status Achieved   SLP LONG TERM GOAL #2   Title pt will complete dysphagia HEP with modified independence over 3 sessions   Baseline two sessions 05-16-15   Status Achieved   SLP LONG TERM GOAL #3   Title pt will clear voice/cough with hydrophonic voice independently 90% of the time   Status Achieved   SLP LONG TERM GOAL #4   Title pt/wife will indicate 3 s/s aspiration PNA   Status Achieved   SLP LONG TERM GOAL #5   Title pt to demo effortful swallow in 85% of swallows up to 25 minutes after NMES placement   Status Achieved   SLP LONG TERM GOAL #6   Title pt to demo effortful swallow in 90% of swallows in first 30 minutes after NMES placement   Time 4   Period Weeks   Status On-going  Goal to continue for new reporting period (until 06-29-15)   SLP LONG TERM GOAL #7   Title with NMES placed, pt will exhibit wet voice no more than 10% of the time after initial swallow of POs over three sessions   Status  Achieved          Plan - 06/13/15 1734    Clinical Impression Statement Pt continues with wet voice after initial swallow, at approx 18 minutes after being placed with NMES. He cont to demo  reduced swallow strength and would benefit from cont skilled ST x3/week utilizing strengthening exercises as well as POs, with the use of NMES.   Speech Therapy Frequency 3x / week   Duration --  3 weeks   Treatment/Interventions Aspiration precaution training;Pharyngeal strengthening exercises;Diet toleration management by SLP;NMES;Cueing hierarchy;Functional tasks;Patient/family education;Compensatory strategies;SLP instruction and feedback;Language facilitation   Potential to Achieve Goals Fair   Potential Considerations Severity of impairments   Consulted and Agree with Plan of Care Family member/caregiver        Problem List Patient Active Problem List   Diagnosis Date Noted  . Cerebral infarction due to embolism of left middle cerebral artery ([place]) 04/18/2015  . Chronic anticoagulation 04/18/2015  . HLD (hyperlipidemia) 04/18/2015  . Persistent atrial fibrillation ([place]) 04/18/2015  . Gait disturbance, post-stroke 03/29/2015  . Atrial fibrillation with RVR ([place]) 03/13/2015  . Dysphagia   . Left middle cerebral artery stroke ([place]) 02/02/2015  . Dysphagia, pharyngoesophageal phase 02/02/2015  . Aphasia due to recent cerebrovascular accident 02/02/2015  . Bacterial pneumonia   . Stroke with cerebral ischemia ([place])   . Stroke ([place]) 01/31/2015  . CVA (cerebral infarction) 01/31/2015  . Aphasia   . Hypokalemia   . CAP (community acquired pneumonia) 01/26/2015  . Pulmonary fibrosis ([place]) 01/26/2015  . PCP NOTES >>>>>>>>>>>> 12/14/2014  . Vitamin D deficiency 11/04/2013  . Vasomotor rhinitis 09/22/2013  . Diarrhea 07/08/2013  . Crohn's disease ([place]) 07/08/2013  . Postinflammatory pulmonary fibrosis ([place]) 05/26/2013  . Anxiety state 05/20/2013  . Sprain of shoulder, left 06/01/2012  .  Personal history of colonic polyps 04/12/2012  . Pulmonary nodules 03/02/2011  . Cough 03/02/2011  . Long term (current) use of anticoagulants 02/21/2011  . Atrial fibrillation with controlled ventricular response ([place]) 02/17/2011  . Annual physical exam 12/23/2010  . Dyspnea 09/03/2010  . HEMORRHOIDS-INTERNAL 03/08/2010  . [place] INTESTINE 03/08/2010  . RECTAL BLEEDING 03/08/2010  . HEMATOCHEZIA 03/06/2010  . Essential hypertension 02/04/2010  . HYPERTROPHY PROSTATE W/O UR OBST & OTH LUTS 12/10/2009  . Osteopenia 12/07/2009  . PERIPHERAL NEUROPATHY 08/29/2009  . FECAL INCONTINENCE 01/29/2009  . OSTEOARTHRITIS, ANKLE, RIGHT 11/01/2008  . ERECTILE DYSFUNCTION 12/02/2007  . TREMOR 12/02/2007  . CARCINOMA, SKIN, SQUAMOUS CELL 09/16/2006  . B12 DEFICIENCY 08/27/2006  . HYPERTRIGLYCERIDEMIA 08/27/2006    [facility] ,[place], [place]  06/13/2015, 5:36 PM  [facility] [address] [place], [place], [address] Phone: [phone]   Fax:  [phone]   Name: Brian [name] MRN: [id number] Date of Birth: 1929/02/03

## 2015-06-15 ENCOUNTER — Ambulatory Visit: Payer: Medicare Other

## 2015-06-15 DIAGNOSIS — R1310 Dysphagia, unspecified: Secondary | ICD-10-CM

## 2015-06-15 DIAGNOSIS — I69320 Aphasia following cerebral infarction: Secondary | ICD-10-CM | POA: Diagnosis not present

## 2015-06-15 NOTE — Therapy (Signed)
[facility] [address] [place], [place], [address] Phone: [phone]   Fax:  [phone]  Speech Language Pathology Treatment  Patient Details  Name: Brian [name] MRN: [id number] Date of Birth: 1929/02/07 Referring Provider: Rosalin Hawking M.D.  Encounter Date: 06/15/2015      End of Session - 06/15/15 1206    Visit Number 30   Number of Visits 36   Date for SLP Re-Evaluation 06/29/15   SLP Start Time 1104   SLP Stop Time  1145   SLP Time Calculation (min) 41 min   Activity Tolerance Patient tolerated treatment well      Past Medical History  Diagnosis Date  . Muscle tremor     saw neurology remotely elsewhere, was Rx inderal  . Crohn's ileocolitis ([place])   . Osteopenia     per DEXA 11/2008  . Hypertension 11/11    mild  . Hemorrhoids   . Hypertrophy of prostate     w/o UR obst & oth luts  . Peripheral neuropathy (HCC)     h/o neg w/u  . ED (erectile dysfunction)   . Hypertriglyceridemia   . COPD (chronic obstructive pulmonary disease) ([place])     recent dx--no acute problems  . Hiatal hernia 1956    found while in service  . Anxiety   . Atrial fibrillation with RVR ([place])   . Long term (current) use of anticoagulants 02/21/2011  . Serrated adenoma of colon 03/2010  . Renal cyst     bilateral  . Pulmonary fibrosis ([place]) 2015    Rx Esbriet ~ 10-2013 ([name]), Dr. [name]  . On home oxygen therapy     "2L; 20-24h for the past week" (01/26/2015)  . Emphysema lung ([place]) 2016    Dr. [name]  . B12 deficiency anemia   . Idiopathic pulmonary fibrosis ([place]) 2016    Dr. [name]  . Pneumonia ?2015  . CAP (community acquired pneumonia) 01/26/2015  . Osteoarthritis     "maybe in my hands, feet" (01/26/2015)  . Depression   . Squamous cell carcinoma of skin of scalp     tx'd ~ 75yrs; "froze them off"  . Squamous cell carcinoma, face      "froze them off"  . Melanoma of scalp ([place])   . Stroke [facility])     Past  Surgical History  Procedure Laterality Date  . Colon resection  1984    resection of distal ileum and cecum w/ appendectomy, 18-inch small intestine, for Crohn's disease  . Colon surgery    . Appendectomy  1984  . Cataract extraction w/ intraocular lens  implant, bilateral Bilateral 2007  . Cholecystectomy  02/17/2011    Procedure: LAPAROSCOPIC CHOLECYSTECTOMY WITH INTRAOPERATIVE CHOLANGIOGRAM;  Surgeon: Edward Jolly, MD;  Location: [facility];  Service: General;  Laterality: N/A;  . Mohs surgery Right ~ 2014    "side of my scalp"    There were no vitals filed for this visit.  Visit Diagnosis: Dysphagia  Aphasia following cerebral infarction      Subjective Assessment - 06/15/15 1117    Patient is accompained by: --  wife   Currently in Pain? No/denies               ADULT SLP TREATMENT - 06/15/15 1122    General Information   Behavior/Cognition Alert;Cooperative;Pleasant mood;Requires cueing   Treatment Provided   Treatment provided Dysphagia   Dysphagia Treatment   Temperature Spikes Noted No   Respiratory Status  Nasal cannula   Oral Cavity - Dentition Dentures, top;Dentures, bottom   Treatment Methods Skilled observation;Therapeutic exercise   Patient observed directly with PO's Yes   Type of PO's observed Ice chips;Dysphagia 1 (puree)   Oral Phase Signs & Symptoms --  premature spillage saliva post-swallow possible   Pharyngeal Phase Signs & Symptoms Wet vocal quality  10% after first swallow first 25 minutes, 20%>25 minutes   Other treatment/comments NMES placed with placement 3a and mA 10.5. SLP facilitated dysphagia therapy using this coupled with dys I items and ice chips. Wife states pt with more sinus drainage today than last session. Pt exhibits more frequent wet voice today than last session. Effortful swallow completed with min cues rarely 75-80% of the time.  Pt demo'd knowledge of "wet" voice 3/4.   Assessment / Recommendations / Plan   Plan  Continue with current plan of care  contacted Dr. Erlinda Hong re: follow up modified today   Progression Toward Goals   Progression toward goals Progressing toward goals            SLP Short Term Goals - 05/01/15 1654    SLP SHORT TERM GOAL #1   Title pt will demo efforftul swallows 60% of the time in >100 opportunities over 3 sessions   Baseline three sesions 04-26-15   Status Achieved   SLP SHORT TERM GOAL #2   Title pt will throat clear/cough with hydrophonic voice with rare nonverbal cues   Status Achieved   SLP SHORT TERM GOAL #3   Title pt will complete HEP for dysphagia with modified independence (demo cues)   Status Achieved          SLP Long Term Goals - 06/15/15 1228    SLP LONG TERM GOAL #1   Title pt will demo effortful swallows 75% of the time in >120 opportunities over 3 sessions   Status Achieved   SLP LONG TERM GOAL #2   Title pt will complete dysphagia HEP with modified independence over 3 sessions   Baseline two sessions 05-16-15   Status Achieved   SLP LONG TERM GOAL #3   Title pt will clear voice/cough with hydrophonic voice independently 90% of the time   Status Achieved   SLP LONG TERM GOAL #4   Title pt/wife will indicate 3 s/s aspiration PNA   Status Achieved   SLP LONG TERM GOAL #5   Title pt to demo effortful swallow in 85% of swallows up to 25 minutes after NMES placement   Status Achieved   SLP LONG TERM GOAL #6   Title pt to demo effortful swallow in 90% of swallows in first 30 minutes after NMES placement   Time 4   Period Weeks   Status On-going  Goal to continue for new reporting period (until 06-29-15)   SLP LONG TERM GOAL #7   Title with NMES placed, pt will exhibit wet voice no more than 10% of the time after initial swallow of POs over three sessions   Status Achieved          Plan - 06/15/15 1207    Clinical Impression Statement (p) Wet voice exhibited more today but possibly due to incr'd sinus drainage. Pt remains without s/s  aspiration PNA. He cont to demo reduced swallow strength and would benefit from cont skilled ST x3/week utilizing strengthening exercises as well as POs, with the use of NMES.   Speech Therapy Frequency (p) 3x / week   Duration (p) --  3  weeks   Treatment/Interventions (p) Aspiration precaution training;Pharyngeal strengthening exercises;Diet toleration management by SLP;NMES;Cueing hierarchy;Functional tasks;Patient/family education;Compensatory strategies;SLP instruction and feedback;Language facilitation   Potential to Achieve Goals (p) Fair   Potential Considerations (p) Severity of impairments   Consulted and Agree with Plan of Care (p) Family member/caregiver          G-Codes - June 21, 2015 30-Jun-1227    Functional Assessment Tool Used NOMS   Functional Limitations Swallowing   Swallow Current Status [id number]) At least 80 percent but less than 100 percent impaired, limited or restricted   Swallow Goal Status [id number]) At least 60 percent but less than 80 percent impaired, limited or restricted     Speech Therapy Progress Note  Dates of Reporting Period: date of 20th visit to present  Subjective Statement: Pt has now been seen for 30 ST sessions targeting swallowing.  Objective Measurements: Pt's frequency of wet voice after first swallow has decr'd considerably since last report at 20th session. Pt has made gains with success with effortful swallow. He has also maintained no overt s/s aspiration PNA despite switching to mainly POs with dys I items.  Goal Update: See goals above.  Plan: Pt is ready for follow up modified barium swallow exam (MBSS) at this time. SLP has requested this from his referring MD.  Reason Skilled Services are Required: Cont to work with pt to get him back on PO diet. Follow up MBSS with further tx after that exam TBD based upon results.     Problem List Patient Active Problem List   Diagnosis Date Noted  . Cerebral infarction due to embolism of left middle  cerebral artery ([place]) 04/18/2015  . Chronic anticoagulation 04/18/2015  . HLD (hyperlipidemia) 04/18/2015  . Persistent atrial fibrillation ([place]) 04/18/2015  . Gait disturbance, post-stroke 03/29/2015  . Atrial fibrillation with RVR ([place]) 03/13/2015  . Dysphagia   . Left middle cerebral artery stroke ([place]) 02/02/2015  . Dysphagia, pharyngoesophageal phase 02/02/2015  . Aphasia due to recent cerebrovascular accident 02/02/2015  . Bacterial pneumonia   . Stroke with cerebral ischemia ([place])   . Stroke ([place]) 01/31/2015  . CVA (cerebral infarction) 01/31/2015  . Aphasia   . Hypokalemia   . CAP (community acquired pneumonia) 01/26/2015  . Pulmonary fibrosis ([place]) 01/26/2015  . PCP NOTES >>>>>>>>>>>> 12/14/2014  . Vitamin D deficiency 11/04/2013  . Vasomotor rhinitis 09/22/2013  . Diarrhea 07/08/2013  . Crohn's disease ([place]) 07/08/2013  . Postinflammatory pulmonary fibrosis ([place]) 05/26/2013  . Anxiety state 05/20/2013  . Sprain of shoulder, left 06/01/2012  . Personal history of colonic polyps 04/12/2012  . Pulmonary nodules 03/02/2011  . Cough 03/02/2011  . Long term (current) use of anticoagulants 02/21/2011  . Atrial fibrillation with controlled ventricular response ([place]) 02/17/2011  . Annual physical exam 12/23/2010  . Dyspnea 09/03/2010  . HEMORRHOIDS-INTERNAL 03/08/2010  . [place] INTESTINE 03/08/2010  . RECTAL BLEEDING 03/08/2010  . HEMATOCHEZIA 03/06/2010  . Essential hypertension 02/04/2010  . HYPERTROPHY PROSTATE W/O UR OBST & OTH LUTS 12/10/2009  . Osteopenia 12/07/2009  . PERIPHERAL NEUROPATHY 08/29/2009  . FECAL INCONTINENCE 01/29/2009  . OSTEOARTHRITIS, ANKLE, RIGHT 11/01/2008  . ERECTILE DYSFUNCTION 12/02/2007  . TREMOR 12/02/2007  . CARCINOMA, SKIN, SQUAMOUS CELL 09/16/2006  . B12 DEFICIENCY 08/27/2006  . HYPERTRIGLYCERIDEMIA 08/27/2006    [place] ,[place], [place]  06/21/2015, 1:13 PM  [facility] [address] [place], [place], [address] Phone: [phone]   Fax:  [phone]   Name: Rett  TYLA KIFLE MRN: [id number] Date of Birth: 05/23/1928

## 2015-06-16 ENCOUNTER — Other Ambulatory Visit: Payer: Self-pay | Admitting: Neurology

## 2015-06-16 DIAGNOSIS — I639 Cerebral infarction, unspecified: Secondary | ICD-10-CM

## 2015-06-18 ENCOUNTER — Ambulatory Visit: Payer: Medicare Other

## 2015-06-18 DIAGNOSIS — I69320 Aphasia following cerebral infarction: Secondary | ICD-10-CM

## 2015-06-18 DIAGNOSIS — R1310 Dysphagia, unspecified: Secondary | ICD-10-CM

## 2015-06-18 NOTE — Patient Instructions (Signed)
If you don't hear from a scheduler by next session I will give you the number to call to schedule the test.

## 2015-06-18 NOTE — Therapy (Signed)
[facility] [address] [place], [place], [address] Phone: [phone]   Fax:  [phone]  Speech Language Pathology Treatment  Patient Details  Name: Brian [name] MRN: [id number] Date of Birth: 25-Jul-1928 Referring Provider: Rosalin Hawking M.D.  Encounter Date: 06/18/2015      End of Session - 06/18/15 1727    Visit Number 31   Number of Visits 36   Date for SLP Re-Evaluation 06/29/15   SLP Start Time 17   SLP Stop Time  1401   SLP Time Calculation (min) 43 min   Activity Tolerance Patient tolerated treatment well      Past Medical History  Diagnosis Date  . Muscle tremor     saw neurology remotely elsewhere, was Rx inderal  . Crohn's ileocolitis ([place])   . Osteopenia     per DEXA 11/2008  . Hypertension 11/11    mild  . Hemorrhoids   . Hypertrophy of prostate     w/o UR obst & oth luts  . Peripheral neuropathy (HCC)     h/o neg w/u  . ED (erectile dysfunction)   . Hypertriglyceridemia   . COPD (chronic obstructive pulmonary disease) ([place])     recent dx--no acute problems  . Hiatal hernia 1956    found while in service  . Anxiety   . Atrial fibrillation with RVR ([place])   . Long term (current) use of anticoagulants 02/21/2011  . Serrated adenoma of colon 03/2010  . Renal cyst     bilateral  . Pulmonary fibrosis ([place]) 2015    Rx Esbriet ~ 10-2013 ([name]), Dr. [name]  . On home oxygen therapy     "2L; 20-24h for the past week" (01/26/2015)  . Emphysema lung ([place]) 2016    Dr. [name]  . B12 deficiency anemia   . Idiopathic pulmonary fibrosis ([place]) 2016    Dr. [name]  . Pneumonia ?2015  . CAP (community acquired pneumonia) 01/26/2015  . Osteoarthritis     "maybe in my hands, feet" (01/26/2015)  . Depression   . Squamous cell carcinoma of skin of scalp     tx'd ~ 78yrs; "froze them off"  . Squamous cell carcinoma, face      "froze them off"  . Melanoma of scalp ([place])   . Stroke [facility])     Past  Surgical History  Procedure Laterality Date  . Colon resection  1984    resection of distal ileum and cecum w/ appendectomy, 18-inch small intestine, for Crohn's disease  . Colon surgery    . Appendectomy  1984  . Cataract extraction w/ intraocular lens  implant, bilateral Bilateral 2007  . Cholecystectomy  02/17/2011    Procedure: LAPAROSCOPIC CHOLECYSTECTOMY WITH INTRAOPERATIVE CHOLANGIOGRAM;  Surgeon: Edward Jolly, MD;  Location: [facility];  Service: General;  Laterality: N/A;  . Mohs surgery Right ~ 2014    "side of my scalp"    There were no vitals filed for this visit.  Visit Diagnosis: Dysphagia  Aphasia following cerebral infarction      Subjective Assessment - 06/18/15 1342    Subjective "I like that one!" (carrots - dys I)   Currently in Pain? No/denies               ADULT SLP TREATMENT - 06/18/15 1345    General Information   Behavior/Cognition Alert;Cooperative;Pleasant mood;Requires cueing   Treatment Provided   Treatment provided Dysphagia   Dysphagia Treatment   Temperature Spikes Noted No  Respiratory Status Nasal cannula   Oral Cavity - Dentition Dentures, top;Dentures, bottom   Treatment Methods Skilled observation;Therapeutic exercise   Patient observed directly with PO's Yes   Type of PO's observed Ice chips;Dysphagia 1 (puree)   Oral Phase Signs & Symptoms --  possibly oral spillage to vestibule - delayed wet voice   Pharyngeal Phase Signs & Symptoms Wet vocal quality   40% between boluses, 30% after initial swallow (30% clear)   Other treatment/comments NMES placed after 8 minutes with it off, placement 3b with mA 10.5. No noticable difference in oral or pharyngeal s/s.  Frequency of wet voice decr'd with SLP cues for effortful first swallow. Pt with effortful swallow 80% of the time, incr'd to 95% with occasional min-mod A from SLP for incr'd effort.   Assessment / Recommendations / Plan   Plan Continue with current plan of care    Dysphagia Recommendations   Diet recommendations NPO  ice chips <30 min after oral care;MBSS request made   Progression Toward Goals   Progression toward goals Progressing toward goals            SLP Short Term Goals - 05/01/15 1654    SLP SHORT TERM GOAL #1   Title pt will demo efforftul swallows 60% of the time in >100 opportunities over 3 sessions   Baseline three sesions 04-26-15   Status Achieved   SLP SHORT TERM GOAL #2   Title pt will throat clear/cough with hydrophonic voice with rare nonverbal cues   Status Achieved   SLP SHORT TERM GOAL #3   Title pt will complete HEP for dysphagia with modified independence (demo cues)   Status Achieved          SLP Long Term Goals - 06/18/15 1728    SLP LONG TERM GOAL #1   Title pt will demo effortful swallows 75% of the time in >120 opportunities over 3 sessions   Status Achieved   SLP LONG TERM GOAL #2   Title pt will complete dysphagia HEP with modified independence over 3 sessions   Baseline two sessions 05-16-15   Status Achieved   SLP LONG TERM GOAL #3   Title pt will clear voice/cough with hydrophonic voice independently 90% of the time   Status Achieved   SLP LONG TERM GOAL #4   Title pt/wife will indicate 3 s/s aspiration PNA   Status Achieved   SLP LONG TERM GOAL #5   Title pt to demo effortful swallow in 85% of swallows up to 25 minutes after NMES placement   Status Achieved   SLP LONG TERM GOAL #6   Title pt to demo effortful swallow in 90% of swallows in first 30 minutes after NMES placement   Time 4   Period Weeks   Status On-going  Goal to continue for new reporting period (until 06-29-15)   SLP LONG TERM GOAL #7   Title with NMES placed, pt will exhibit wet voice no more than 10% of the time after initial swallow of POs over three sessions   Status Achieved          Plan - 06/18/15 1727    Clinical Impression Statement Wet voice exhibited more today but possibly due to incr'd sinus drainage. Pt  remains without s/s aspiration PNA. He cont to demo reduced swallow strength and would benefit from cont skilled ST x3/week utilizing strengthening exercises as well as POs, with the use of NMES.   Speech Therapy Frequency 3x / week  Duration 2 weeks   Treatment/Interventions Aspiration precaution training;Pharyngeal strengthening exercises;Diet toleration management by SLP;NMES;Cueing hierarchy;Functional tasks;Patient/family education;Compensatory strategies;SLP instruction and feedback;Language facilitation   Potential to Achieve Goals Good   Potential Considerations Severity of impairments   Consulted and Agree with Plan of Care Family member/caregiver        Problem List Patient Active Problem List   Diagnosis Date Noted  . Cerebral infarction due to embolism of left middle cerebral artery ([place]) 04/18/2015  . Chronic anticoagulation 04/18/2015  . HLD (hyperlipidemia) 04/18/2015  . Persistent atrial fibrillation ([place]) 04/18/2015  . Gait disturbance, post-stroke 03/29/2015  . Atrial fibrillation with RVR ([place]) 03/13/2015  . Dysphagia   . Left middle cerebral artery stroke ([place]) 02/02/2015  . Dysphagia, pharyngoesophageal phase 02/02/2015  . Aphasia due to recent cerebrovascular accident 02/02/2015  . Bacterial pneumonia   . Stroke with cerebral ischemia ([place])   . Stroke ([place]) 01/31/2015  . CVA (cerebral infarction) 01/31/2015  . Aphasia   . Hypokalemia   . CAP (community acquired pneumonia) 01/26/2015  . Pulmonary fibrosis ([place]) 01/26/2015  . PCP NOTES >>>>>>>>>>>> 12/14/2014  . Vitamin D deficiency 11/04/2013  . Vasomotor rhinitis 09/22/2013  . Diarrhea 07/08/2013  . Crohn's disease ([place]) 07/08/2013  . Postinflammatory pulmonary fibrosis ([place]) 05/26/2013  . Anxiety state 05/20/2013  . Sprain of shoulder, left 06/01/2012  . Personal history of colonic polyps 04/12/2012  . Pulmonary nodules 03/02/2011  . Cough 03/02/2011  . Long term (current) use of anticoagulants  02/21/2011  . Atrial fibrillation with controlled ventricular response ([place]) 02/17/2011  . Annual physical exam 12/23/2010  . Dyspnea 09/03/2010  . HEMORRHOIDS-INTERNAL 03/08/2010  . [place] INTESTINE 03/08/2010  . RECTAL BLEEDING 03/08/2010  . HEMATOCHEZIA 03/06/2010  . Essential hypertension 02/04/2010  . HYPERTROPHY PROSTATE W/O UR OBST & OTH LUTS 12/10/2009  . Osteopenia 12/07/2009  . PERIPHERAL NEUROPATHY 08/29/2009  . FECAL INCONTINENCE 01/29/2009  . OSTEOARTHRITIS, ANKLE, RIGHT 11/01/2008  . ERECTILE DYSFUNCTION 12/02/2007  . TREMOR 12/02/2007  . CARCINOMA, SKIN, SQUAMOUS CELL 09/16/2006  . B12 DEFICIENCY 08/27/2006  . HYPERTRIGLYCERIDEMIA 08/27/2006    [place] ,[place], [place]  06/18/2015, 5:28 PM  [facility] [address] [place], [place], [address] Phone: [phone]   Fax:  [phone]   Name: Brian [name] MRN: [id number] Date of Birth: Dec 10, 1928

## 2015-06-19 ENCOUNTER — Other Ambulatory Visit (HOSPITAL_COMMUNITY): Payer: Self-pay | Admitting: Neurology

## 2015-06-19 ENCOUNTER — Telehealth: Payer: Self-pay | Admitting: Neurology

## 2015-06-19 DIAGNOSIS — R1310 Dysphagia, unspecified: Secondary | ICD-10-CM

## 2015-06-19 NOTE — Telephone Encounter (Addendum)
Called and spoke to [place] at [place] 07/03/2015 at 1:00 pm at [facility] 1st Floor radiology to check in at 84: 67 pm Called and spoke to patient's wife with details.

## 2015-06-19 NOTE — Telephone Encounter (Signed)
Hi Dr. Erlinda Hong . Called [facility] to have swallow study  Scheduled [phone] and Reyne Dumas relayed please ask Dr. Erlinda Hong if he still wants patient to have he had one in December. Dr. Erlinda Hong please relay if you want me to schedule.

## 2015-06-19 NOTE — Telephone Encounter (Signed)
Yes, he still needs one. He just saw speech and still on NPO status now. Please schedule. Thanks.   Rosalin Hawking, MD PhD Stroke Neurology 06/19/2015 1:33 PM

## 2015-06-20 ENCOUNTER — Ambulatory Visit: Payer: Medicare Other

## 2015-06-20 DIAGNOSIS — R1310 Dysphagia, unspecified: Secondary | ICD-10-CM

## 2015-06-20 DIAGNOSIS — I69320 Aphasia following cerebral infarction: Secondary | ICD-10-CM | POA: Diagnosis not present

## 2015-06-20 NOTE — Therapy (Signed)
[facility] [address] [place], [place], [address] Phone: [phone]   Fax:  [phone]  Speech Language Pathology Treatment  Patient Details  Name: Brian [name] MRN: [id number] Date of Birth: [birth year] Referring Provider: Rosalin Hawking M.D.  Encounter Date: 06/20/2015      End of Session - 06/20/15 1254    Visit Number 32   Number of Visits 36   Date for SLP Re-Evaluation 06/29/15   SLP Start Time 1148   SLP Stop Time  1230   SLP Time Calculation (min) 42 min   Activity Tolerance Patient tolerated treatment well      Past Medical History  Diagnosis Date  . Muscle tremor     saw neurology remotely elsewhere, was Rx inderal  . Crohn's ileocolitis ([place])   . Osteopenia     per DEXA 11/2008  . Hypertension 11/11    mild  . Hemorrhoids   . Hypertrophy of prostate     w/o UR obst & oth luts  . Peripheral neuropathy (HCC)     h/o neg w/u  . ED (erectile dysfunction)   . Hypertriglyceridemia   . COPD (chronic obstructive pulmonary disease) ([place])     recent dx--no acute problems  . Hiatal hernia 1956    found while in service  . Anxiety   . Atrial fibrillation with RVR ([place])   . Long term (current) use of anticoagulants 02/21/2011  . Serrated adenoma of colon 03/2010  . Renal cyst     bilateral  . Pulmonary fibrosis ([place]) 2015    Rx Esbriet ~ 10-2013 ([name]), Dr. [name]  . On home oxygen therapy     "2L; 20-24h for the past week" (01/26/2015)  . Emphysema lung ([facility]) 2016    Dr. [name]  . B12 deficiency anemia   . Idiopathic pulmonary fibrosis ([place]) 2016    Dr. [name]  . Pneumonia ?2015  . CAP (community acquired pneumonia) 01/26/2015  . Osteoarthritis     "maybe in my hands, feet" (01/26/2015)  . Depression   . Squamous cell carcinoma of skin of scalp     tx'd ~ 70yrs; "froze them off"  . Squamous cell carcinoma, face      "froze them off"  . Melanoma of scalp ([place])   . Stroke [facility])     Past  Surgical History  Procedure Laterality Date  . Colon resection  1984    resection of distal ileum and cecum w/ appendectomy, 18-inch small intestine, for Crohn's disease  . Colon surgery    . Appendectomy  1984  . Cataract extraction w/ intraocular lens  implant, bilateral Bilateral 2007  . Cholecystectomy  02/17/2011    Procedure: LAPAROSCOPIC CHOLECYSTECTOMY WITH INTRAOPERATIVE CHOLANGIOGRAM;  Surgeon: Edward Jolly, MD;  Location: [facility];  Service: General;  Laterality: N/A;  . Mohs surgery Right ~ 2014    "side of my scalp"    There were no vitals filed for this visit.  Visit Diagnosis: Dysphagia  Aphasia following cerebral infarction             ADULT SLP TREATMENT - 06/20/15 1248    General Information   Behavior/Cognition Alert;Cooperative;Pleasant mood;Requires cueing   Treatment Provided   Treatment provided Dysphagia   Dysphagia Treatment   Temperature Spikes Noted No   Respiratory Status Nasal cannula   Oral Cavity - Dentition Dentures, top;Dentures, bottom   Treatment Methods Skilled observation;Therapeutic exercise   Patient observed directly with PO's Yes  Type of PO's observed Ice chips;Dysphagia 1 (puree)   Oral Phase Signs & Symptoms --  possible spillage post-swallow causing throat clear   Pharyngeal Phase Signs & Symptoms Wet vocal quality  <10% post swallow with usual cues for effortful swallow   Other treatment/comments NMES remained off first 8 minutes and SLP used effortful swallow with dys I items and ice chips. No change in frequancy of wet voice post swallow with NMES on vs. off. SLP provided usual mod cues for effortful swallow. When NMES on (placment 3a with mA 10.5) pt maintained effortful swallow at 90% but only with SLP cues. Larger bolus sizes (>1/2 teaspoon) of dys I items resulted in suspected residual audibly clearing from pharygeal cavity.   Assessment / Recommendations / Plan   Plan Continue with current plan of care    Progression Toward Goals   Progression toward goals Progressing toward goals            SLP Short Term Goals - 05/01/15 1654    SLP SHORT TERM GOAL #1   Title pt will demo efforftul swallows 60% of the time in >100 opportunities over 3 sessions   Baseline three sesions 04-26-15   Status Achieved   SLP SHORT TERM GOAL #2   Title pt will throat clear/cough with hydrophonic voice with rare nonverbal cues   Status Achieved   SLP SHORT TERM GOAL #3   Title pt will complete HEP for dysphagia with modified independence (demo cues)   Status Achieved          SLP Long Term Goals - 06/20/15 1255    SLP LONG TERM GOAL #1   Title pt will demo effortful swallows 75% of the time in >120 opportunities over 3 sessions   Status Achieved   SLP LONG TERM GOAL #2   Title pt will complete dysphagia HEP with modified independence over 3 sessions   Baseline two sessions 05-16-15   Status Achieved   SLP LONG TERM GOAL #3   Title pt will clear voice/cough with hydrophonic voice independently 90% of the time   Status Achieved   SLP LONG TERM GOAL #4   Title pt/wife will indicate 3 s/s aspiration PNA   Status Achieved   SLP LONG TERM GOAL #5   Title pt to demo effortful swallow in 85% of swallows up to 25 minutes after NMES placement   Status Achieved   SLP LONG TERM GOAL #6   Title pt to demo effortful swallow in 90% of swallows in first 30 minutes after NMES placement   Time 4   Period Weeks   Status On-going  Goal to continue for new reporting period (until 06-29-15)   SLP LONG TERM GOAL #7   Title with NMES placed, pt will exhibit wet voice no more than 10% of the time after initial swallow of POs over three sessions   Status Achieved          Plan - 06/20/15 1254    Clinical Impression Statement Wet voice exhibited <10% of the time today post swallow but SLP cues (visual, verbal) for effortful swallow usually. Pt remains without s/s aspiration PNA. He cont to demo reduced swallow  strength and would benefit from cont skilled ST x3/week utilizing strengthening exercises as well as POs, with the use of NMES.   Speech Therapy Frequency 3x / week   Duration 2 weeks   Treatment/Interventions Aspiration precaution training;Pharyngeal strengthening exercises;Diet toleration management by SLP;NMES;Cueing hierarchy;Functional tasks;Patient/family education;Compensatory strategies;SLP instruction and  feedback;Language facilitation   Potential to Achieve Goals Good   Potential Considerations Severity of impairments   Consulted and Agree with Plan of Care Family member/caregiver        Problem List Patient Active Problem List   Diagnosis Date Noted  . Cerebral infarction due to embolism of left middle cerebral artery ([place]) 04/18/2015  . Chronic anticoagulation 04/18/2015  . HLD (hyperlipidemia) 04/18/2015  . Persistent atrial fibrillation ([place]) 04/18/2015  . Gait disturbance, post-stroke 03/29/2015  . Atrial fibrillation with RVR ([place]) 03/13/2015  . Dysphagia   . Left middle cerebral artery stroke ([place]) 02/02/2015  . Dysphagia, pharyngoesophageal phase 02/02/2015  . Aphasia due to recent cerebrovascular accident 02/02/2015  . Bacterial pneumonia   . Stroke with cerebral ischemia ([place])   . Stroke ([place]) 01/31/2015  . CVA (cerebral infarction) 01/31/2015  . Aphasia   . Hypokalemia   . CAP (community acquired pneumonia) 01/26/2015  . Pulmonary fibrosis ([place]) 01/26/2015  . PCP NOTES >>>>>>>>>>>> 12/14/2014  . Vitamin D deficiency 11/04/2013  . Vasomotor rhinitis 09/22/2013  . Diarrhea 07/08/2013  . Crohn's disease ([place]) 07/08/2013  . Postinflammatory pulmonary fibrosis ([place]) 05/26/2013  . Anxiety state 05/20/2013  . Sprain of shoulder, left 06/01/2012  . Personal history of colonic polyps 04/12/2012  . Pulmonary nodules 03/02/2011  . Cough 03/02/2011  . Long term (current) use of anticoagulants 02/21/2011  . Atrial fibrillation with controlled ventricular response  ([place]) 02/17/2011  . Annual physical exam 12/23/2010  . Dyspnea 09/03/2010  . HEMORRHOIDS-INTERNAL 03/08/2010  . [place] INTESTINE 03/08/2010  . RECTAL BLEEDING 03/08/2010  . HEMATOCHEZIA 03/06/2010  . Essential hypertension 02/04/2010  . HYPERTROPHY PROSTATE W/O UR OBST & OTH LUTS 12/10/2009  . Osteopenia 12/07/2009  . PERIPHERAL NEUROPATHY 08/29/2009  . FECAL INCONTINENCE 01/29/2009  . OSTEOARTHRITIS, ANKLE, RIGHT 11/01/2008  . ERECTILE DYSFUNCTION 12/02/2007  . TREMOR 12/02/2007  . CARCINOMA, SKIN, SQUAMOUS CELL 09/16/2006  . B12 DEFICIENCY 08/27/2006  . HYPERTRIGLYCERIDEMIA 08/27/2006    [place] ,[place], [place]  06/20/2015, 12:55 PM  [facility] [address] [place], [place], [address] Phone: [phone]   Fax:  [phone]   Name: Brian [name] MRN: [id number] Date of Birth: 1928-11-16

## 2015-06-22 ENCOUNTER — Ambulatory Visit: Payer: Medicare Other

## 2015-06-22 DIAGNOSIS — I69320 Aphasia following cerebral infarction: Secondary | ICD-10-CM | POA: Diagnosis not present

## 2015-06-22 DIAGNOSIS — R1310 Dysphagia, unspecified: Secondary | ICD-10-CM

## 2015-06-22 NOTE — Therapy (Signed)
[facility] [address] [place], [place], [address] Phone: [phone]   Fax:  [phone]  Speech Language Pathology Treatment  Patient Details  Name: Brian [name] MRN: [id number] Date of Birth: Oct 04, 1928 Referring Provider: Rosalin Hawking M.D.  Encounter Date: 06/22/2015      End of Session - 06/22/15 1148    Visit Number 33   Number of Visits 36   Date for SLP Re-Evaluation 06/29/15   SLP Start Time 12   SLP Stop Time  1145   SLP Time Calculation (min) 43 min   Activity Tolerance Patient tolerated treatment well      Past Medical History  Diagnosis Date  . Muscle tremor     saw neurology remotely elsewhere, was Rx inderal  . Crohn's ileocolitis ([place])   . Osteopenia     per DEXA 11/2008  . Hypertension 11/11    mild  . Hemorrhoids   . Hypertrophy of prostate     w/o UR obst & oth luts  . Peripheral neuropathy (HCC)     h/o neg w/u  . ED (erectile dysfunction)   . Hypertriglyceridemia   . COPD (chronic obstructive pulmonary disease) ([place])     recent dx--no acute problems  . Hiatal hernia 1956    found while in service  . Anxiety   . Atrial fibrillation with RVR ([place])   . Long term (current) use of anticoagulants 02/21/2011  . Serrated adenoma of colon 03/2010  . Renal cyst     bilateral  . Pulmonary fibrosis ([place]) 2015    Rx Esbriet ~ 10-2013 ([name]), Dr. [name]  . On home oxygen therapy     "2L; 20-24h for the past week" (01/26/2015)  . Emphysema lung ([place]) 2016    Dr. [name]  . B12 deficiency anemia   . Idiopathic pulmonary fibrosis ([place]) 2016    Dr. [name]  . Pneumonia ?2015  . CAP (community acquired pneumonia) 01/26/2015  . Osteoarthritis     "maybe in my hands, feet" (01/26/2015)  . Depression   . Squamous cell carcinoma of skin of scalp     tx'd ~ 58yrs; "froze them off"  . Squamous cell carcinoma, face      "froze them off"  . Melanoma of scalp ([place])   . Stroke [facility])     Past  Surgical History  Procedure Laterality Date  . Colon resection  1984    resection of distal ileum and cecum w/ appendectomy, 18-inch small intestine, for Crohn's disease  . Colon surgery    . Appendectomy  1984  . Cataract extraction w/ intraocular lens  implant, bilateral Bilateral 2007  . Cholecystectomy  02/17/2011    Procedure: LAPAROSCOPIC CHOLECYSTECTOMY WITH INTRAOPERATIVE CHOLANGIOGRAM;  Surgeon: Edward Jolly, MD;  [facility];  Service: General;  Laterality: N/A;  . Mohs surgery Right ~ 2014    "side of my scalp"    There were no vitals filed for this visit.  Visit Diagnosis: Dysphagia  Aphasia following cerebral infarction      Subjective Assessment - 06/22/15 1140    Subjective "Ooooo, tomatoes. I like those!"   Patient is accompained by: Family member  wife   Currently in Pain? No/denies               ADULT SLP TREATMENT - 06/22/15 1144    General Information   Behavior/Cognition Alert;Cooperative;Pleasant mood;Requires cueing   Treatment Provided   Treatment provided Dysphagia   Dysphagia Treatment  Temperature Spikes Noted No   Respiratory Status Nasal cannula   Oral Cavity - Dentition Dentures, top;Dentures, bottom   Treatment Methods Skilled observation;Therapeutic exercise   Patient observed directly with PO's Yes   Type of PO's observed Ice chips;Dysphagia 1 (puree)   Oral Phase Signs & Symptoms --  possible posterior saliva spillage post swallow   Pharyngeal Phase Signs & Symptoms Wet vocal quality  <10% after first swallow SLP occasional cues for incr effort   Other treatment/comments NMES off for first 8 minutes. Frequency of wet voice no difference than with NMES placed. Placement 3b with mA=10.5. Pt fed self last 6 minutes with appropriate bite sizes of 1/2-1 teaspoon. No overt s/s aspiration PNA noted or reported.  Effortful swallow 85% without SLP cues.   Assessment / Recommendations / Plan   Plan Continue with current plan  of care   Progression Toward Goals   Progression toward goals Progressing toward goals            SLP Short Term Goals - 05/01/15 1654    SLP SHORT TERM GOAL #1   Title pt will demo efforftul swallows 60% of the time in >100 opportunities over 3 sessions   Baseline three sesions 04-26-15   Status Achieved   SLP SHORT TERM GOAL #2   Title pt will throat clear/cough with hydrophonic voice with rare nonverbal cues   Status Achieved   SLP SHORT TERM GOAL #3   Title pt will complete HEP for dysphagia with modified independence (demo cues)   Status Achieved          SLP Long Term Goals - 06/22/15 1149    SLP LONG TERM GOAL #1   Title pt will demo effortful swallows 75% of the time in >120 opportunities over 3 sessions   Status Achieved   SLP LONG TERM GOAL #2   Title pt will complete dysphagia HEP with modified independence over 3 sessions   Baseline two sessions 05-16-15   Status Achieved   SLP LONG TERM GOAL #3   Title pt will clear voice/cough with hydrophonic voice independently 90% of the time   Status Achieved   SLP LONG TERM GOAL #4   Title pt/wife will indicate 3 s/s aspiration PNA   Status Achieved   SLP LONG TERM GOAL #5   Title pt to demo effortful swallow in 85% of swallows up to 25 minutes after NMES placement   Status Achieved   SLP LONG TERM GOAL #6   Title pt to demo effortful swallow in 90% of swallows in first 30 minutes after NMES placement   Time 4   Period Weeks   Status On-going  Goal to continue for new reporting period (until 06-29-15)   SLP LONG TERM GOAL #7   Title with NMES placed, pt will exhibit wet voice no more than 10% of the time after initial swallow of POs over three sessions   Status Achieved          Plan - 06/22/15 1149    Clinical Impression Statement Wet voice exhibited <10% of the time today post swallow but SLP cues (visual, verbal) for effortful swallow occasionally. Pt remains without s/s aspiration PNA. He cont to demo  reduced swallow strength and would benefit from cont skilled ST x3/week utilizing strengthening exercises as well as POs, with the use of NMES.   Speech Therapy Frequency 3x / week   Duration 2 weeks   Treatment/Interventions Aspiration precaution training;Pharyngeal strengthening exercises;Diet toleration management  by SLP;NMES;Cueing hierarchy;Functional tasks;Patient/family education;Compensatory strategies;SLP instruction and feedback;Language facilitation   Potential to Achieve Goals Good   Potential Considerations Severity of impairments   Consulted and Agree with Plan of Care Family member/caregiver        Problem List Patient Active Problem List   Diagnosis Date Noted  . Cerebral infarction due to embolism of left middle cerebral artery ([place]) 04/18/2015  . Chronic anticoagulation 04/18/2015  . HLD (hyperlipidemia) 04/18/2015  . Persistent atrial fibrillation ([place]) 04/18/2015  . Gait disturbance, post-stroke 03/29/2015  . Atrial fibrillation with RVR ([place]) 03/13/2015  . Dysphagia   . Left middle cerebral artery stroke ([place]) 02/02/2015  . Dysphagia, pharyngoesophageal phase 02/02/2015  . Aphasia due to recent cerebrovascular accident 02/02/2015  . Bacterial pneumonia   . Stroke with cerebral ischemia ([place])   . Stroke ([place]) 01/31/2015  . CVA (cerebral infarction) 01/31/2015  . Aphasia   . Hypokalemia   . CAP (community acquired pneumonia) 01/26/2015  . Pulmonary fibrosis ([place]) 01/26/2015  . PCP NOTES >>>>>>>>>>>> 12/14/2014  . Vitamin D deficiency 11/04/2013  . Vasomotor rhinitis 09/22/2013  . Diarrhea 07/08/2013  . Crohn's disease ([place]) 07/08/2013  . Postinflammatory pulmonary fibrosis ([place]) 05/26/2013  . Anxiety state 05/20/2013  . Sprain of shoulder, left 06/01/2012  . Personal history of colonic polyps 04/12/2012  . Pulmonary nodules 03/02/2011  . Cough 03/02/2011  . Long term (current) use of anticoagulants 02/21/2011  . Atrial fibrillation with controlled  ventricular response ([place]) 02/17/2011  . Annual physical exam 12/23/2010  . Dyspnea 09/03/2010  . HEMORRHOIDS-INTERNAL 03/08/2010  . [place] INTESTINE 03/08/2010  . RECTAL BLEEDING 03/08/2010  . HEMATOCHEZIA 03/06/2010  . Essential hypertension 02/04/2010  . HYPERTROPHY PROSTATE W/O UR OBST & OTH LUTS 12/10/2009  . Osteopenia 12/07/2009  . PERIPHERAL NEUROPATHY 08/29/2009  . FECAL INCONTINENCE 01/29/2009  . OSTEOARTHRITIS, ANKLE, RIGHT 11/01/2008  . ERECTILE DYSFUNCTION 12/02/2007  . TREMOR 12/02/2007  . CARCINOMA, SKIN, SQUAMOUS CELL 09/16/2006  . B12 DEFICIENCY 08/27/2006  . HYPERTRIGLYCERIDEMIA 08/27/2006    [place] ,[place], [place]  06/22/2015, 11:50 AM  [facility] [address] [place], [place], [address] Phone: [phone]   Fax:  [phone]   Name: Brian [name] MRN: [id number] Date of Birth: [birth year]

## 2015-06-25 ENCOUNTER — Telehealth: Payer: Self-pay | Admitting: Internal Medicine

## 2015-06-25 MED ORDER — BUSPIRONE HCL 15 MG PO TABS: 15.0000 mg | ORAL_TABLET | Freq: Two times a day (BID) | ORAL | Status: DC

## 2015-06-25 NOTE — Telephone Encounter (Signed)
Rx sent 

## 2015-06-25 NOTE — Telephone Encounter (Signed)
Caller name: Dalene Seltzer Relationship to patient: wife Can be reached: [phone] Pharmacy: [facility], [place]  Reason for call: pt needing refill on buspirone 15mg  tablet. Has enough for 2 weeks. Please send 3 month supply to mail order.

## 2015-06-27 ENCOUNTER — Ambulatory Visit: Payer: Medicare Other | Attending: Neurology

## 2015-06-27 DIAGNOSIS — I69320 Aphasia following cerebral infarction: Secondary | ICD-10-CM | POA: Diagnosis not present

## 2015-06-27 DIAGNOSIS — R1310 Dysphagia, unspecified: Secondary | ICD-10-CM | POA: Insufficient documentation

## 2015-06-27 NOTE — Therapy (Signed)
[facility] [address] [place], [place], [address] Phone: [phone]   Fax:  [phone]  Speech Language Pathology Treatment  Patient Details  Name: Brian [name] MRN: [id number] Date of Birth: 03-13-1929 Referring Provider: Rosalin Hawking M.D.  Encounter Date: 06/27/2015      End of Session - 06/27/15 1353    Visit Number 34   Number of Visits 30   Date for SLP Re-Evaluation 06/29/15   SLP Start Time 1150   SLP Stop Time  1230   SLP Time Calculation (min) 40 min   Activity Tolerance Patient tolerated treatment well      Past Medical History  Diagnosis Date  . Muscle tremor     saw neurology remotely elsewhere, was Rx inderal  . Crohn's ileocolitis ([place])   . Osteopenia     per DEXA 11/2008  . Hypertension 11/11    mild  . Hemorrhoids   . Hypertrophy of prostate     w/o UR obst & oth luts  . Peripheral neuropathy (HCC)     h/o neg w/u  . ED (erectile dysfunction)   . Hypertriglyceridemia   . COPD (chronic obstructive pulmonary disease) ([place])     recent dx--no acute problems  . Hiatal hernia 1956    found while in service  . Anxiety   . Atrial fibrillation with RVR ([place])   . Long term (current) use of anticoagulants 02/21/2011  . Serrated adenoma of colon 03/2010  . Renal cyst     bilateral  . Pulmonary fibrosis ([place]) 2015    Rx Esbriet ~ 10-2013 ([name]), Dr. [name]  . On home oxygen therapy     "2L; 20-24h for the past week" (01/26/2015)  . Emphysema lung ([place]) 2016    Dr. [name]  . B12 deficiency anemia   . Idiopathic pulmonary fibrosis ([place]) 2016    Dr. [name]  . Pneumonia ?2015  . CAP (community acquired pneumonia) 01/26/2015  . Osteoarthritis     "maybe in my hands, feet" (01/26/2015)  . Depression   . Squamous cell carcinoma of skin of scalp     tx'd ~ 2yrs; "froze them off"  . Squamous cell carcinoma, face      "froze them off"  . Melanoma of scalp ([place])   . Stroke [facility])     Past  Surgical History  Procedure Laterality Date  . Colon resection  1984    resection of distal ileum and cecum w/ appendectomy, 18-inch small intestine, for Crohn's disease  . Colon surgery    . Appendectomy  1984  . Cataract extraction w/ intraocular lens  implant, bilateral Bilateral 2007  . Cholecystectomy  02/17/2011    Procedure: LAPAROSCOPIC CHOLECYSTECTOMY WITH INTRAOPERATIVE CHOLANGIOGRAM;  Surgeon: Edward Jolly, MD;  Location: [facility];  Service: General;  Laterality: N/A;  . Mohs surgery Right ~ 2014    "side of my scalp"    There were no vitals filed for this visit.  Visit Diagnosis: Dysphagia  Aphasia following cerebral infarction      Subjective Assessment - 06/27/15 1200    Subjective Wife reports drainage last two days likely from allergies               ADULT SLP TREATMENT - 06/27/15 1154    General Information   Behavior/Cognition Alert;Cooperative;Pleasant mood;Requires cueing   Treatment Provided   Treatment provided Dysphagia   Dysphagia Treatment   Temperature Spikes Noted No   Respiratory Status Nasal cannula  Oral Cavity - Dentition Dentures, top;Dentures, bottom   Treatment Methods Skilled observation;Therapeutic exercise   Patient observed directly with PO's Yes   Type of PO's observed Ice chips;Dysphagia 1 (puree)   Oral Phase Signs & Symptoms --  no overt s/s    Pharyngeal Phase Signs & Symptoms Wet vocal quality  occasionally   Other treatment/comments NMES targeted today to increase strength of pt's musculature, placement 3a with mA 10.0. Dys I items and ice chips used today. Pt with wet voice more frequently today after first swallow, however pt entered room with wet voice. Wet voice noted by pt approx 40% of the time.  NMES was purposely left off for first 5 minutes of POs today. Pt cont without overt s/s aspiration PNA. Effortful swallow 80%.   Assessment / Recommendations / Plan   Plan Continue with current plan of care    Progression Toward Goals   Progression toward goals Progressing toward goals            SLP Short Term Goals - 06/27/15 1355    SLP SHORT TERM GOAL #1   Title pt will demo efforftul swallows 60% of the time in >100 opportunities over 3 sessions   Baseline three sesions 04-26-15   Status Achieved   SLP SHORT TERM GOAL #2   Title pt will throat clear/cough with hydrophonic voice with rare nonverbal cues   Status Achieved   SLP SHORT TERM GOAL #3   Title pt will complete HEP for dysphagia with modified independence (demo cues)   Status Achieved          SLP Long Term Goals - 06/27/15 1358    SLP LONG TERM GOAL #1   Title pt will demo effortful swallows 75% of the time in >120 opportunities over 3 sessions   Status Achieved   SLP LONG TERM GOAL #2   Title pt will complete dysphagia HEP with modified independence over 3 sessions   Baseline two sessions 05-16-15   Status Achieved   SLP LONG TERM GOAL #3   Title pt will clear voice/cough with hydrophonic voice independently 90% of the time   Status Achieved   SLP LONG TERM GOAL #4   Title pt/wife will indicate 3 s/s aspiration PNA   Status Achieved   SLP LONG TERM GOAL #5   Title pt to demo effortful swallow in 85% of swallows up to 25 minutes after NMES placement   Status Achieved   SLP LONG TERM GOAL #6   Title pt to demo effortful swallow in 90% of swallows in first 30 minutes after NMES placement   Time 4   Period Weeks   Status On-going  Goal to continue for new reporting period (until 06-29-15)   SLP LONG TERM GOAL #7   Title with NMES placed, pt will exhibit wet voice no more than 10% of the time after initial swallow of POs over three sessions   Status Achieved          Plan - 06/27/15 1354    Clinical Impression Statement Wet voice exhibited ~40% of the time today pre and post-first swallow but SLP cues (visual, verbal) for effortful swallow occasionally. Pt remains without s/s aspiration PNA. He cont to demo  reduced swallow strength and would benefit from cont skilled ST x3/week utilizing strengthening exercises as well as POs, with the use of NMES.   Speech Therapy Frequency 3x / week   Duration 1 week   Treatment/Interventions Aspiration precaution training;Pharyngeal strengthening  exercises;Diet toleration management by SLP;NMES;Cueing hierarchy;Functional tasks;Patient/family education;Compensatory strategies;SLP instruction and feedback;Language facilitation   Potential to Achieve Goals Good   Potential Considerations Severity of impairments   Consulted and Agree with Plan of Care Family member/caregiver        Problem List Patient Active Problem List   Diagnosis Date Noted  . Cerebral infarction due to embolism of left middle cerebral artery ([place]) 04/18/2015  . Chronic anticoagulation 04/18/2015  . HLD (hyperlipidemia) 04/18/2015  . Persistent atrial fibrillation ([place]) 04/18/2015  . Gait disturbance, post-stroke 03/29/2015  . Atrial fibrillation with RVR ([place]) 03/13/2015  . Dysphagia   . Left middle cerebral artery stroke ([place]) 02/02/2015  . Dysphagia, pharyngoesophageal phase 02/02/2015  . Aphasia due to recent cerebrovascular accident 02/02/2015  . Bacterial pneumonia   . Stroke with cerebral ischemia ([place])   . Stroke ([place]) 01/31/2015  . CVA (cerebral infarction) 01/31/2015  . Aphasia   . Hypokalemia   . CAP (community acquired pneumonia) 01/26/2015  . Pulmonary fibrosis ([place]) 01/26/2015  . PCP NOTES >>>>>>>>>>>> 12/14/2014  . Vitamin D deficiency 11/04/2013  . Vasomotor rhinitis 09/22/2013  . Diarrhea 07/08/2013  . Crohn's disease ([place]) 07/08/2013  . Postinflammatory pulmonary fibrosis ([place]) 05/26/2013  . Anxiety state 05/20/2013  . Sprain of shoulder, left 06/01/2012  . Personal history of colonic polyps 04/12/2012  . Pulmonary nodules 03/02/2011  . Cough 03/02/2011  . Long term (current) use of anticoagulants 02/21/2011  . Atrial fibrillation with controlled  ventricular response ([place]) 02/17/2011  . Annual physical exam 12/23/2010  . Dyspnea 09/03/2010  . HEMORRHOIDS-INTERNAL 03/08/2010  . [place] INTESTINE 03/08/2010  . RECTAL BLEEDING 03/08/2010  . HEMATOCHEZIA 03/06/2010  . Essential hypertension 02/04/2010  . HYPERTROPHY PROSTATE W/O UR OBST & OTH LUTS 12/10/2009  . Osteopenia 12/07/2009  . PERIPHERAL NEUROPATHY 08/29/2009  . FECAL INCONTINENCE 01/29/2009  . OSTEOARTHRITIS, ANKLE, RIGHT 11/01/2008  . ERECTILE DYSFUNCTION 12/02/2007  . TREMOR 12/02/2007  . CARCINOMA, SKIN, SQUAMOUS CELL 09/16/2006  . B12 DEFICIENCY 08/27/2006  . HYPERTRIGLYCERIDEMIA 08/27/2006    [facility] [place] ,[place], [place]  06/27/2015, 1:59 PM  [facility] [address] [place], [place], [address] Phone: [phone]   Fax:  [phone]   Name: KEROLOS PONZI MRN: [id number] Date of Birth: 04/11/1928

## 2015-06-27 NOTE — Patient Instructions (Signed)
Wait until Friday to see if OK to give Robie food over the weekend, like you have been providing for our therapy sessions.

## 2015-06-28 ENCOUNTER — Ambulatory Visit: Payer: Medicare Other

## 2015-06-28 DIAGNOSIS — I69320 Aphasia following cerebral infarction: Secondary | ICD-10-CM | POA: Diagnosis not present

## 2015-06-28 DIAGNOSIS — R1310 Dysphagia, unspecified: Secondary | ICD-10-CM | POA: Diagnosis not present

## 2015-06-28 NOTE — Therapy (Signed)
[facility] [address] [place], [place], [address] Phone: [phone]   Fax:  [phone]  Speech Language Pathology Treatment  Patient Details  Name: Brian [name] MRN: [id number] Date of Birth: [birth year] Referring Provider: Rosalin Hawking M.D.  Encounter Date: 06/28/2015      End of Session - 06/28/15 1643    Visit Number 35   Number of Visits 36   Date for SLP Re-Evaluation 06/29/15   SLP Start Time 1533   SLP Stop Time  1615   SLP Time Calculation (min) 42 min   Activity Tolerance Patient tolerated treatment well      Past Medical History  Diagnosis Date  . Muscle tremor     saw neurology remotely elsewhere, was Rx inderal  . Crohn's ileocolitis ([place])   . Osteopenia     per DEXA 11/2008  . Hypertension 11/11    mild  . Hemorrhoids   . Hypertrophy of prostate     w/o UR obst & oth luts  . Peripheral neuropathy (HCC)     h/o neg w/u  . ED (erectile dysfunction)   . Hypertriglyceridemia   . COPD (chronic obstructive pulmonary disease) ([place])     recent dx--no acute problems  . Hiatal hernia 1956    found while in service  . Anxiety   . Atrial fibrillation with RVR ([place])   . Long term (current) use of anticoagulants 02/21/2011  . Serrated adenoma of colon 03/2010  . Renal cyst     bilateral  . Pulmonary fibrosis ([place]) 2015    Rx Esbriet ~ 10-2013 ([name]), Dr. [name]  . On home oxygen therapy     "2L; 20-24h for the past week" (01/26/2015)  . Emphysema lung ([place]) 2016    Dr. [name]  . B12 deficiency anemia   . Idiopathic pulmonary fibrosis ([place]) 2016    Dr. [name]  . Pneumonia ?2015  . CAP (community acquired pneumonia) 01/26/2015  . Osteoarthritis     "maybe in my hands, feet" (01/26/2015)  . Depression   . Squamous cell carcinoma of skin of scalp     tx'd ~ 55yrs; "froze them off"  . Squamous cell carcinoma, face      "froze them off"  . Melanoma of scalp ([place])   . Stroke [facility])     Past  Surgical History  Procedure Laterality Date  . Colon resection  1984    resection of distal ileum and cecum w/ appendectomy, 18-inch small intestine, for Crohn's disease  . Colon surgery    . Appendectomy  1984  . Cataract extraction w/ intraocular lens  implant, bilateral Bilateral 2007  . Cholecystectomy  02/17/2011    Procedure: LAPAROSCOPIC CHOLECYSTECTOMY WITH INTRAOPERATIVE CHOLANGIOGRAM;  Surgeon: Edward Jolly, MD;  Location: [facility];  Service: General;  Laterality: N/A;  . Mohs surgery Right ~ 2014    "side of my scalp"    There were no vitals filed for this visit.  Visit Diagnosis: Dysphagia  Aphasia following cerebral infarction      Subjective Assessment - 06/28/15 1553    Subjective Pt's wife reports pt drainage (nasal) incr'd upon walking to car this afternoon.   Patient is accompained by: Family member   Currently in Pain? No/denies               ADULT SLP TREATMENT - 06/28/15 1557    General Information   Behavior/Cognition Alert;Cooperative;Pleasant mood;Requires cueing   Treatment Provided   Treatment provided  Dysphagia   Dysphagia Treatment   Temperature Spikes Noted No   Respiratory Status Nasal cannula   Oral Cavity - Dentition Dentures, top;Dentures, bottom   Treatment Methods Skilled observation;Therapeutic exercise   Patient observed directly with PO's Yes   Type of PO's observed Ice chips;Dysphagia 1 (puree)   Oral Phase Signs & Symptoms --  possible oral spillage post swallow   Pharyngeal Phase Signs & Symptoms Wet vocal quality  rarely; pt noted approx 65% of the time   Other treatment/comments No overt s/s aspiration PNA observed or reported. Placement 3b with NMES was used with effortful swallows with dys I items and ice chips to incr strength and coordination of swallowing musculature. Pt with effortful swallow with min A occasionally 90% of the time. Second swallows were as timely as first swallows, consistently. Third swallows  were timely for approx first 15 minutes after NMES turned up to 10.35mA.     Assessment / Recommendations / Plan   Plan Continue with current plan of care   Progression Toward Goals   Progression toward goals Progressing toward goals            SLP Short Term Goals - 06/27/15 1355    SLP SHORT TERM GOAL #1   Title pt will demo efforftul swallows 60% of the time in >100 opportunities over 3 sessions   Baseline three sesions 04-26-15   Status Achieved   SLP SHORT TERM GOAL #2   Title pt will throat clear/cough with hydrophonic voice with rare nonverbal cues   Status Achieved   SLP SHORT TERM GOAL #3   Title pt will complete HEP for dysphagia with modified independence (demo cues)   Status Achieved          SLP Long Term Goals - 06/28/15 1650    SLP LONG TERM GOAL #1   Title pt will demo effortful swallows 75% of the time in >120 opportunities over 3 sessions   Status Achieved   SLP LONG TERM GOAL #2   Title pt will complete dysphagia HEP with modified independence over 3 sessions   Baseline two sessions 05-16-15   Status Achieved   SLP LONG TERM GOAL #3   Title pt will clear voice/cough with hydrophonic voice independently 90% of the time   Status Achieved   SLP LONG TERM GOAL #4   Title pt/wife will indicate 3 s/s aspiration PNA   Status Achieved   SLP LONG TERM GOAL #5   Title pt to demo effortful swallow in 85% of swallows up to 25 minutes after NMES placement   Status Achieved   SLP LONG TERM GOAL #6   Title pt to demo effortful swallow in 90% of swallows in first 30 minutes after NMES placement   Time 4   Period Weeks   Status On-going  Goal to continue for new reporting period (until 06-29-15)   SLP LONG TERM GOAL #7   Title with NMES placed, pt will exhibit wet voice no more than 10% of the time after initial swallow of POs over three sessions   Status Achieved          Plan - 06/28/15 1644    Clinical Impression Statement Wet voice exhibited much less of  the time today than yesterday. Effortful swallows 90% with occasional min A. Second swallows remained timely and immediate approx 90-95% of the time. Third swallows still have a delay due to fatigue. Pt remains without s/s aspiration PNA. He cont to demo reduced  swallow strength and would benefit from cont skilled ST x3/week utilizing strengthening exercises as well as POs, with the use of NMES.   Speech Therapy Frequency 3x / week   Duration 1 week   Treatment/Interventions Aspiration precaution training;Pharyngeal strengthening exercises;Diet toleration management by SLP;NMES;Cueing hierarchy;Functional tasks;Patient/family education;Compensatory strategies;SLP instruction and feedback;Language facilitation   Potential to Achieve Goals Good   Potential Considerations Severity of impairments   Consulted and Agree with Plan of Care Family member/caregiver        Problem List Patient Active Problem List   Diagnosis Date Noted  . Cerebral infarction due to embolism of left middle cerebral artery ([place]) 04/18/2015  . Chronic anticoagulation 04/18/2015  . HLD (hyperlipidemia) 04/18/2015  . Persistent atrial fibrillation ([place]) 04/18/2015  . Gait disturbance, post-stroke 03/29/2015  . Atrial fibrillation with RVR ([place]) 03/13/2015  . Dysphagia   . Left middle cerebral artery stroke ([place]) 02/02/2015  . Dysphagia, pharyngoesophageal phase 02/02/2015  . Aphasia due to recent cerebrovascular accident 02/02/2015  . Bacterial pneumonia   . Stroke with cerebral ischemia ([place])   . Stroke ([place]) 01/31/2015  . CVA (cerebral infarction) 01/31/2015  . Aphasia   . Hypokalemia   . CAP (community acquired pneumonia) 01/26/2015  . Pulmonary fibrosis ([place]) 01/26/2015  . PCP NOTES >>>>>>>>>>>> 12/14/2014  . Vitamin D deficiency 11/04/2013  . Vasomotor rhinitis 09/22/2013  . Diarrhea 07/08/2013  . Crohn's disease ([place]) 07/08/2013  . Postinflammatory pulmonary fibrosis ([place]) 05/26/2013  . Anxiety state  05/20/2013  . Sprain of shoulder, left 06/01/2012  . Personal history of colonic polyps 04/12/2012  . Pulmonary nodules 03/02/2011  . Cough 03/02/2011  . Long term (current) use of anticoagulants 02/21/2011  . Atrial fibrillation with controlled ventricular response ([place]) 02/17/2011  . Annual physical exam 12/23/2010  . Dyspnea 09/03/2010  . HEMORRHOIDS-INTERNAL 03/08/2010  . [place] INTESTINE 03/08/2010  . RECTAL BLEEDING 03/08/2010  . HEMATOCHEZIA 03/06/2010  . Essential hypertension 02/04/2010  . HYPERTROPHY PROSTATE W/O UR OBST & OTH LUTS 12/10/2009  . Osteopenia 12/07/2009  . PERIPHERAL NEUROPATHY 08/29/2009  . FECAL INCONTINENCE 01/29/2009  . OSTEOARTHRITIS, ANKLE, RIGHT 11/01/2008  . ERECTILE DYSFUNCTION 12/02/2007  . TREMOR 12/02/2007  . CARCINOMA, SKIN, SQUAMOUS CELL 09/16/2006  . B12 DEFICIENCY 08/27/2006  . HYPERTRIGLYCERIDEMIA 08/27/2006    [facility] ,[place], [place]  06/28/2015, 4:51 PM  [facility] [address] [place], [place], [address] Phone: [phone]   Fax:  [phone]   Name: Brian [name] MRN: [id number] Date of Birth: 1928/11/08

## 2015-06-29 ENCOUNTER — Ambulatory Visit: Payer: Medicare Other

## 2015-06-29 DIAGNOSIS — R1310 Dysphagia, unspecified: Secondary | ICD-10-CM | POA: Diagnosis not present

## 2015-06-29 DIAGNOSIS — I69320 Aphasia following cerebral infarction: Secondary | ICD-10-CM

## 2015-06-29 NOTE — Therapy (Signed)
[facility] [address] [place], [place], [address] Phone: [phone]   Fax:  [phone]  Speech Language Pathology Treatment  Patient Details  Name: Brian [name] MRN: [id number] Date of Birth: [birth year] Referring Provider: Rosalin Hawking M.D.  Encounter Date: 06/29/2015      End of Session - 06/29/15 1614    Visit Number 36   Number of Visits 63   Date for SLP Re-Evaluation 08/03/15   SLP Start Time 1404   SLP Stop Time  1445   SLP Time Calculation (min) 41 min   Activity Tolerance Patient tolerated treatment well      Past Medical History  Diagnosis Date  . Muscle tremor     saw neurology remotely elsewhere, was Rx inderal  . Crohn's ileocolitis ([facility])   . Osteopenia     per DEXA 11/2008  . Hypertension 11/11    mild  . Hemorrhoids   . Hypertrophy of prostate     w/o UR obst & oth luts  . Peripheral neuropathy (HCC)     h/o neg w/u  . ED (erectile dysfunction)   . Hypertriglyceridemia   . COPD (chronic obstructive pulmonary disease) ([place])     recent dx--no acute problems  . Hiatal hernia 1956    found while in service  . Anxiety   . Atrial fibrillation with RVR ([place])   . Long term (current) use of anticoagulants 02/21/2011  . Serrated adenoma of colon 03/2010  . Renal cyst     bilateral  . Pulmonary fibrosis ([facility]) 2015    Rx Esbriet ~ 10-2013 ([name]), Dr. [name]  . On home oxygen therapy     "2L; 20-24h for the past week" (01/26/2015)  . Emphysema lung ([facility]) 2016    Dr. [name]  . B12 deficiency anemia   . Idiopathic pulmonary fibrosis ([place]) 2016    Dr. [name]  . Pneumonia ?2015  . CAP (community acquired pneumonia) 01/26/2015  . Osteoarthritis     "maybe in my hands, feet" (01/26/2015)  . Depression   . Squamous cell carcinoma of skin of scalp     tx'd ~ 40yr; "froze them off"  . Squamous cell carcinoma, face      "froze them off"  . [place] of [place] ([place]   . Stroke ([facility]     Past  Surgical History  Procedure Laterality Date  . Colon resection  1984    resection of distal ileum and cecum w/ appendectomy, 18-inch small intestine, for Crohn's disease  . Colon surgery    . Appendectomy  1984  . Cataract extraction w/ intraocular lens  implant, bilateral Bilateral 2007  . Cholecystectomy  02/17/2011    Procedure: LAPAROSCOPIC CHOLECYSTECTOMY WITH INTRAOPERATIVE CHOLANGIOGRAM;  Surgeon: BEdward Jolly MD;  Location: [facility];  Service: General;  Laterality: N/A;  . Mohs surgery Right ~ 2014    "side of my scalp"    There were no vitals filed for this visit.      Subjective Assessment - 06/28/15 1553    Subjective Pt's wife reports pt drainage (nasal) incr'd upon walking to car this afternoon.   Patient is accompained by: Family member   Currently in Pain? No/denies               ADULT SLP TREATMENT - 06/29/15 1607    General Information   Behavior/Cognition Alert;Cooperative;Pleasant mood;Requires cueing   Treatment Provided   Treatment provided Dysphagia   Dysphagia Treatment   Temperature Spikes  Noted No   Respiratory Status Nasal cannula   Oral Cavity - Dentition Dentures, top;Dentures, bottom   Treatment Methods Skilled observation;Therapeutic exercise   Patient observed directly with PO's Yes   Type of PO's observed Ice chips;Dysphagia 1 (puree)   Oral Phase Signs & Symptoms --  possible oral spillage to pharynx post swallow   Pharyngeal Phase Signs & Symptoms Wet vocal quality  occasionally, pt noted 50% of the time   Other treatment/comments NMES used today for first 8 minutes with mA=0, as with last 5 sessions. No overt s/s aspiration PNA cont. SLP reiewed guidelines re: POs at home with wife. Wife demo'd understanding. Pt with WNL timing for first and second swallows 24 minutes into session, third swallow timely approx 8 minutes into session with intermittent timlieness after that time. Pt with effortful swallow 85%-90% in first 30 minutes  of NMES placed.   Assessment / Recommendations / Plan   Plan Continue with current plan of care;New goals to be determined pending instrumental study   Progression Toward Goals   Progression toward goals Progressing toward goals          SLP Education - 06/29/15 1614    Education provided Yes   Education Details guidelines for home POs over weekend   Person(s) Educated Spouse   Methods Explanation;Demonstration   Comprehension Verbalized understanding          SLP Short Term Goals - 06/27/15 1355    SLP SHORT TERM GOAL #1   Title pt will demo efforftul swallows 60% of the time in >100 opportunities over 3 sessions   Baseline three sesions 04-26-15   Status Achieved   SLP SHORT TERM GOAL #2   Title pt will throat clear/cough with hydrophonic voice with rare nonverbal cues   Status Achieved   SLP SHORT TERM GOAL #3   Title pt will complete HEP for dysphagia with modified independence (demo cues)   Status Achieved          SLP Long Term Goals - 06/29/15 1623    SLP LONG TERM GOAL #1   Title pt will demo effortful swallows 75% of the time in >120 opportunities over 3 sessions   Status Achieved   SLP LONG TERM GOAL #2   Title pt will complete dysphagia HEP with modified independence over 3 sessions   Baseline two sessions 05-16-15   Status Achieved   SLP LONG TERM GOAL #3   Title pt will clear voice/cough with hydrophonic voice independently 90% of the time   Status Achieved   SLP LONG TERM GOAL #4   Title pt/wife will indicate 3 s/s aspiration PNA   Status Achieved   SLP LONG TERM GOAL #5   Title pt to demo effortful swallow in 85% of swallows up to 25 minutes after NMES placement   Status Achieved   Additional Long Term Goals   Additional Long Term Goals Yes   SLP LONG TERM GOAL #6   Title pt to demo effortful swallow in 90% of swallows in first 30 minutes after NMES placement   Time 4   Period Weeks   Status Not Met  Goal to continue for new reporting period  (until 06-29-15)   SLP LONG TERM GOAL #7   Title with NMES placed, pt will exhibit wet voice no more than 10% of the time after initial swallow of POs over three sessions   Status Achieved   SLP LONG TERM GOAL #8   Title pt will  follow swallow precautions indicated on modfied barium swallow report 07-03-15   Time 4   Period Weeks   Status New   SLP LONG TERM GOAL  #9   TITLE pt will demo effortful swallow 90% with rare min A in first 30 minutes after NMES placed over 3 sessions   Time 4   Period Weeks   Status New          Plan - 06/29/15 1615    Clinical Impression Statement  Effortful swallows 85% in first 30 minutes of NMES placement. Second swallows have become much more timely and immediate and greater stamina has been seen with third swallows, but still have delay due to fatigue, moreso as session progresses. Pt remains without s/s aspiration PNA and will initiate 1/4 cup pureed POs at home with precautions. He cont to demo reduced swallow strength and would benefit from cont skilled ST x3/week utilizing strengthening exercises as well as POs, with the use of NMES. Specific goal changes may occur after modified barium swallow exam on 07-03-15.   Speech Therapy Frequency 3x / week   Duration 4 weeks  or 12 additional therpy sessions; beginning 07-02-15   Treatment/Interventions Aspiration precaution training;Pharyngeal strengthening exercises;Diet toleration management by SLP;NMES;Cueing hierarchy;Functional tasks;Patient/family education;Compensatory strategies;SLP instruction and feedback;Language facilitation   Potential to Achieve Goals Good   Potential Considerations Severity of impairments   Consulted and Agree with Plan of Care Family member/caregiver      Patient will benefit from skilled therapeutic intervention in order to improve the following deficits and impairments:   Dysphagia  Aphasia following cerebral infarction    Problem List Patient Active Problem List    Diagnosis Date Noted  . Cerebral infarction due to embolism of left middle cerebral artery ([place]) 04/18/2015  . Chronic anticoagulation 04/18/2015  . HLD (hyperlipidemia) 04/18/2015  . Persistent atrial fibrillation ([place]) 04/18/2015  . Gait disturbance, post-stroke 03/29/2015  . Atrial fibrillation with RVR ([place]) 03/13/2015  . Dysphagia   . Left middle cerebral artery stroke ([place]) 02/02/2015  . Dysphagia, pharyngoesophageal phase 02/02/2015  . Aphasia due to recent cerebrovascular accident 02/02/2015  . Bacterial pneumonia   . Stroke with cerebral ischemia ([place])   . Stroke ([place]) 01/31/2015  . CVA (cerebral infarction) 01/31/2015  . Aphasia   . Hypokalemia   . CAP (community acquired pneumonia) 01/26/2015  . Pulmonary fibrosis ([place]) 01/26/2015  . PCP NOTES >>>>>>>>>>>> 12/14/2014  . Vitamin D deficiency 11/04/2013  . Vasomotor rhinitis 09/22/2013  . Diarrhea 07/08/2013  . Crohn's disease ([place]) 07/08/2013  . Postinflammatory pulmonary fibrosis ([place]) 05/26/2013  . Anxiety state 05/20/2013  . Sprain of shoulder, left 06/01/2012  . Personal history of colonic polyps 04/12/2012  . Pulmonary nodules 03/02/2011  . Cough 03/02/2011  . Long term (current) use of anticoagulants 02/21/2011  . Atrial fibrillation with controlled ventricular response ([place]) 02/17/2011  . Annual physical exam 12/23/2010  . Dyspnea 09/03/2010  . HEMORRHOIDS-INTERNAL 03/08/2010  . [place] INTESTINE 03/08/2010  . RECTAL BLEEDING 03/08/2010  . HEMATOCHEZIA 03/06/2010  . Essential hypertension 02/04/2010  . HYPERTROPHY PROSTATE W/O UR OBST & OTH LUTS 12/10/2009  . Osteopenia 12/07/2009  . PERIPHERAL NEUROPATHY 08/29/2009  . FECAL INCONTINENCE 01/29/2009  . OSTEOARTHRITIS, ANKLE, RIGHT 11/01/2008  . ERECTILE DYSFUNCTION 12/02/2007  . TREMOR 12/02/2007  . CARCINOMA, SKIN, SQUAMOUS CELL 09/16/2006  . B12 DEFICIENCY 08/27/2006  . HYPERTRIGLYCERIDEMIA 08/27/2006    [place] ,[place],  [place]  06/29/2015, 4:25 PM  [facility]  [address] [place], [place], [address] Phone: [phone]   Fax:  [phone]   Name: THOMOS DOMINE MRN: [id number] Date of Birth: 1928/03/25

## 2015-06-29 NOTE — Patient Instructions (Signed)
1/2 - 3/4 teaspoon bites of pureed food Three swallows each bite Cough/clear throat and reswallow approx every other bite One 1/4 cup food per day

## 2015-07-02 ENCOUNTER — Ambulatory Visit: Payer: Medicare Other

## 2015-07-02 DIAGNOSIS — I69320 Aphasia following cerebral infarction: Secondary | ICD-10-CM

## 2015-07-02 DIAGNOSIS — R1310 Dysphagia, unspecified: Secondary | ICD-10-CM | POA: Diagnosis not present

## 2015-07-02 NOTE — Therapy (Signed)
[facility] [address] [place], [place], [address] Phone: [phone]   Fax:  [phone]  Speech Language Pathology Treatment  Patient Details  Name: Brian [name] MRN: [id number] Date of Birth: 12/07/1928 Referring Provider: Rosalin Hawking M.D.  Encounter Date: 07/02/2015      End of Session - 07/02/15 1656    Visit Number 37   Number of Visits 64   Date for SLP Re-Evaluation 08/03/15   SLP Start Time 4580   SLP Stop Time  9983   SLP Time Calculation (min) 43 min      Past Medical History  Diagnosis Date  . Muscle tremor     saw neurology remotely elsewhere, was Rx inderal  . Crohn's ileocolitis ([place])   . Osteopenia     per DEXA 11/2008  . Hypertension 11/11    mild  . Hemorrhoids   . Hypertrophy of prostate     w/o UR obst & oth luts  . Peripheral neuropathy (HCC)     h/o neg w/u  . ED (erectile dysfunction)   . Hypertriglyceridemia   . COPD (chronic obstructive pulmonary disease) ([place])     recent dx--no acute problems  . Hiatal hernia 1956    found while in service  . Anxiety   . Atrial fibrillation with RVR ([place])   . Long term (current) use of anticoagulants 02/21/2011  . Serrated adenoma of colon 03/2010  . Renal cyst     bilateral  . Pulmonary fibrosis ([place]) 2015    Rx Esbriet ~ 10-2013 ([name]), Dr. [name]  . On home oxygen therapy     "2L; 20-24h for the past week" (01/26/2015)  . Emphysema lung ([place]) 2016    Dr. [name]  . B12 deficiency anemia   . Idiopathic pulmonary fibrosis ([place]) 2016    Dr. [name]  . Pneumonia ?2015  . CAP (community acquired pneumonia) 01/26/2015  . Osteoarthritis     "maybe in my hands, feet" (01/26/2015)  . Depression   . Squamous cell carcinoma of skin of scalp     tx'd ~ 58yr; "froze them off"  . Squamous cell carcinoma, face      "froze them off"  . Melanoma of scalp ([place]   . Stroke ([facility]     Past Surgical History  Procedure Laterality Date  . Colon  resection  1984    resection of distal ileum and cecum w/ appendectomy, 18-inch small intestine, for Crohn's disease  . Colon surgery    . Appendectomy  1984  . Cataract extraction w/ intraocular lens  implant, bilateral Bilateral 2007  . Cholecystectomy  02/17/2011    Procedure: LAPAROSCOPIC CHOLECYSTECTOMY WITH INTRAOPERATIVE CHOLANGIOGRAM;  Surgeon: BEdward Jolly MD;  Location: [facility];  Service: General;  Laterality: N/A;  . Mohs surgery Right ~ 2014    "side of my scalp"    There were no vitals filed for this visit.      Subjective Assessment - 07/02/15 1419    Subjective Nasal drainage throughout weekend. Wife cont suspect allergies.   Patient is accompained by: Family member  wife, JRomie Minus  Currently in Pain? No/denies               ADULT SLP TREATMENT - 07/02/15 1446    General Information   Behavior/Cognition Alert;Cooperative;Pleasant mood;Requires cueing   Treatment Provided   Treatment provided Dysphagia   Dysphagia Treatment   Temperature Spikes Noted No   Respiratory Status Nasal cannula  Oral Cavity - Dentition Dentures, top;Dentures, bottom   Treatment Methods Skilled observation;Therapeutic exercise   Patient observed directly with PO's Yes   Type of PO's observed Dysphagia 1 (puree)   Pharyngeal Phase Signs & Symptoms Wet vocal quality  if boluses >3/4 teaspoon, pt with voluntary throat clearing   Other treatment/comments NMES placed today with placement 3a and mA10.5. Dys I items of varying puree consistency from more watery to more of a mashed potato consistency. Pt's initial and second swallows were timely for entire session, third swallow became more troublesome after 21 minutes after NMES placed. Pt volunatrily cleared throat after SLP cue twice to clear throat with wet voice in first 7 minutes after NMES placement. SLP told pt's wife to bring a couple food items for modified barium swallow tomorrow for SLP to try with pt.   Assessment /  Recommendations / Plan   Plan Continue with current plan of care   Dysphagia Recommendations   Diet recommendations NPO  ice chips <30 minutes after thorough oral care   Progression Toward Goals   Progression toward goals Progressing toward goals            SLP Short Term Goals - 06/27/15 1355    SLP SHORT TERM GOAL #1   Title pt will demo efforftul swallows 60% of the time in >100 opportunities over 3 sessions   Baseline three sesions 04-26-15   Status Achieved   SLP SHORT TERM GOAL #2   Title pt will throat clear/cough with hydrophonic voice with rare nonverbal cues   Status Achieved   SLP SHORT TERM GOAL #3   Title pt will complete HEP for dysphagia with modified independence (demo cues)   Status Achieved          SLP Long Term Goals - 07/02/15 1700    SLP LONG TERM GOAL #1   Title pt will demo effortful swallows 75% of the time in >120 opportunities over 3 sessions   Status Achieved   SLP LONG TERM GOAL #2   Title pt will complete dysphagia HEP with modified independence over 3 sessions   Baseline two sessions 05-16-15   Status Achieved   SLP LONG TERM GOAL #3   Title pt will clear voice/cough with hydrophonic voice independently 90% of the time   Status Achieved   SLP LONG TERM GOAL #4   Title pt/wife will indicate 3 s/s aspiration PNA   Status Achieved   SLP LONG TERM GOAL #5   Title pt to demo effortful swallow in 85% of swallows up to 25 minutes after NMES placement   Status Achieved   SLP LONG TERM GOAL #6   Title pt to demo effortful swallow in 90% of swallows in first 30 minutes after NMES placement   Time 4   Period Weeks   Status Not Met  Goal to continue for new reporting period (until 06-29-15)   SLP LONG TERM GOAL #7   Title with NMES placed, pt will exhibit wet voice no more than 10% of the time after initial swallow of POs over three sessions   Status Achieved   SLP LONG TERM GOAL #8   Title pt will follow swallow precautions indicated on modfied  barium swallow report 07-03-15   Time 4   Period Weeks   Status New   SLP LONG TERM GOAL  #9   TITLE pt will demo effortful swallow 90% with rare min A in first 30 minutes after NMES placed over 3  sessions   Time 4   Period Weeks   Status New          Plan - 07/02/15 1657    Clinical Impression Statement  Effortful swallows 85% in first 30 minutes of NMES placement with SLP occasional min-mod A. First and second swallows remain timely and  third swallows now show delay as session progresses. Pt remains without s/s aspiration PNA and pt/wife have initiated 1/4 cup pureed POs at home with precautions. He cont to demo reduced swallow strength and would benefit from cont skilled ST x3/week utilizing strengthening exercises as well as POs including but not limited to the use of NMES. Specific goal changes may occur after modified barium swallow exam on 07-03-15.   Speech Therapy Frequency 3x / week   Duration 4 weeks   Treatment/Interventions Aspiration precaution training;Pharyngeal strengthening exercises;Diet toleration management by SLP;NMES;Cueing hierarchy;Functional tasks;Patient/family education;Compensatory strategies;SLP instruction and feedback;Language facilitation   Potential to Achieve Goals Good   Potential Considerations Severity of impairments      Patient will benefit from skilled therapeutic intervention in order to improve the following deficits and impairments:   Dysphagia  Aphasia following cerebral infarction    Problem List Patient Active Problem List   Diagnosis Date Noted  . Cerebral infarction due to embolism of left middle cerebral artery ([place]) 04/18/2015  . Chronic anticoagulation 04/18/2015  . HLD (hyperlipidemia) 04/18/2015  . Persistent atrial fibrillation ([place]) 04/18/2015  . Gait disturbance, post-stroke 03/29/2015  . Atrial fibrillation with RVR ([place]) 03/13/2015  . Dysphagia   . Left middle cerebral artery stroke ([place]) 02/02/2015  . Dysphagia,  pharyngoesophageal phase 02/02/2015  . Aphasia due to recent cerebrovascular accident 02/02/2015  . Bacterial pneumonia   . Stroke with cerebral ischemia ([place])   . Stroke ([employer]) 01/31/2015  . CVA (cerebral infarction) 01/31/2015  . Aphasia   . Hypokalemia   . CAP (community acquired pneumonia) 01/26/2015  . Pulmonary fibrosis ([place]) 01/26/2015  . PCP NOTES >>>>>>>>>>>> 12/14/2014  . Vitamin D deficiency 11/04/2013  . Vasomotor rhinitis 09/22/2013  . Diarrhea 07/08/2013  . Crohn's disease ([place]) 07/08/2013  . Postinflammatory pulmonary fibrosis ([place]) 05/26/2013  . Anxiety state 05/20/2013  . Sprain of shoulder, left 06/01/2012  . Personal history of colonic polyps 04/12/2012  . Pulmonary nodules 03/02/2011  . Cough 03/02/2011  . Long term (current) use of anticoagulants 02/21/2011  . Atrial fibrillation with controlled ventricular response ([place]) 02/17/2011  . Annual physical exam 12/23/2010  . Dyspnea 09/03/2010  . HEMORRHOIDS-INTERNAL 03/08/2010  . [place] INTESTINE 03/08/2010  . RECTAL BLEEDING 03/08/2010  . HEMATOCHEZIA 03/06/2010  . Essential hypertension 02/04/2010  . HYPERTROPHY PROSTATE W/O UR OBST & OTH LUTS 12/10/2009  . Osteopenia 12/07/2009  . PERIPHERAL NEUROPATHY 08/29/2009  . FECAL INCONTINENCE 01/29/2009  . OSTEOARTHRITIS, ANKLE, RIGHT 11/01/2008  . ERECTILE DYSFUNCTION 12/02/2007  . TREMOR 12/02/2007  . CARCINOMA, SKIN, SQUAMOUS CELL 09/16/2006  . B12 DEFICIENCY 08/27/2006  . HYPERTRIGLYCERIDEMIA 08/27/2006    [place] ,[place], [place]  07/02/2015, 5:01 PM  [facility] [address] [place], [place], [address] Phone: [phone]   Fax:  [phone]   Name: Brian [name] MRN: [id number] Date of Birth: 10-30-1928

## 2015-07-03 ENCOUNTER — Ambulatory Visit (HOSPITAL_COMMUNITY)
Admission: RE | Admit: 2015-07-03 | Discharge: 2015-07-03 | Disposition: A | Discharge status: Home / Self Care | Payer: Medicare Other | Source: Ambulatory Visit | Attending: Neurology | Admitting: Neurology

## 2015-07-03 DIAGNOSIS — R4702 Dysphasia: Secondary | ICD-10-CM | POA: Diagnosis not present

## 2015-07-03 DIAGNOSIS — R1310 Dysphagia, unspecified: Secondary | ICD-10-CM

## 2015-07-03 DIAGNOSIS — I639 Cerebral infarction, unspecified: Secondary | ICD-10-CM

## 2015-07-04 ENCOUNTER — Ambulatory Visit: Payer: Medicare Other

## 2015-07-04 DIAGNOSIS — R1310 Dysphagia, unspecified: Secondary | ICD-10-CM

## 2015-07-04 DIAGNOSIS — I69320 Aphasia following cerebral infarction: Secondary | ICD-10-CM | POA: Diagnosis not present

## 2015-07-04 NOTE — Patient Instructions (Signed)
   1. Shaker Exercise - head lift - Lie flat on your back in your bed or on a couch without pillows - Raise your head and look at your feet - KEEP YOUR SHOULDERS DOWN - HOLD FOR 45-60 SECONDS, then lower your head back down - Repeat 3 times, 2 times a day   2. Shaker (quick version)  - lie down in same position as above.  - lift head as above, and hold for one second, lower head- repeat 30 times, twice a day

## 2015-07-04 NOTE — Therapy (Signed)
[facility] [address] [place], [place], [address] Phone: [phone]   Fax:  [phone]  Speech Language Pathology Treatment  Patient Details  Name: Brian [name] MRN: [id number] Date of Birth: 06-Dec-1928 Referring Provider: Rosalin Hawking M.D.  Encounter Date: 07/04/2015      End of Session - 07/04/15 1303    Visit Number 38   Number of Visits 58   Date for SLP Re-Evaluation 08/03/15   SLP Start Time 1148   SLP Stop Time  1230   SLP Time Calculation (min) 42 min   Activity Tolerance Patient tolerated treatment well      Past Medical History  Diagnosis Date  . Muscle tremor     saw neurology remotely elsewhere, was Rx inderal  . Crohn's ileocolitis ([place])   . Osteopenia     per DEXA 11/2008  . Hypertension 11/11    mild  . Hemorrhoids   . Hypertrophy of prostate     w/o UR obst & oth luts  . Peripheral neuropathy (HCC)     h/o neg w/u  . ED (erectile dysfunction)   . Hypertriglyceridemia   . COPD (chronic obstructive pulmonary disease) ([place])     recent dx--no acute problems  . Hiatal hernia 1956    found while in service  . Anxiety   . Atrial fibrillation with RVR ([place])   . Long term (current) use of anticoagulants 02/21/2011  . Serrated adenoma of colon 03/2010  . Renal cyst     bilateral  . Pulmonary fibrosis ([place]) 2015    Rx Esbriet ~ 10-2013 ([name]), Dr. [name]  . On home oxygen therapy     "2L; 20-24h for the past week" (01/26/2015)  . Emphysema lung ([place]) 2016    Dr. [name]  . B12 deficiency anemia   . Idiopathic pulmonary fibrosis ([place]) 2016    Dr. [name]  . Pneumonia ?2015  . CAP (community acquired pneumonia) 01/26/2015  . Osteoarthritis     "maybe in my hands, feet" (01/26/2015)  . Depression   . Squamous cell carcinoma of skin of scalp     tx'd ~ 74yr; "froze them off"  . Squamous cell carcinoma, face      "froze them off"  . Melanoma of scalp ([facility]   . Stroke ([facility]     Past  Surgical History  Procedure Laterality Date  . Colon resection  1984    resection of distal ileum and cecum w/ appendectomy, 18-inch small intestine, for Crohn's disease  . Colon surgery    . Appendectomy  1984  . Cataract extraction w/ intraocular lens  implant, bilateral Bilateral 2007  . Cholecystectomy  02/17/2011    Procedure: LAPAROSCOPIC CHOLECYSTECTOMY WITH INTRAOPERATIVE CHOLANGIOGRAM;  Surgeon: BEdward Jolly MD;  Location: [facility];  Service: General;  Laterality: N/A;  . Mohs surgery Right ~ 2014    "side of my scalp"    There were no vitals filed for this visit.             ADULT SLP TREATMENT - 07/04/15 1234    General Information   Behavior/Cognition Alert;Cooperative;Pleasant mood;Requires cueing   Treatment Provided   Treatment provided Dysphagia   Dysphagia Treatment   Temperature Spikes Noted No   Respiratory Status Nasal cannula   Oral Cavity - Dentition Dentures, top;Dentures, bottom   Treatment Methods Skilled observation;Therapeutic exercise   Patient observed directly with PO's Yes   Type of PO's observed Thin liquids;Dysphagia 1 (puree)  runny dys I   Pharyngeal Phase Signs & Symptoms Wet vocal quality  eliminated when pt "hocked"    Other treatment/comments Placement 3a with NMES and mA 10.5 today with runny dys I and thin liquids, per modifed (MBSS) yesterday. Pt remained without overt s/s aspiration PNA. SLP provided approx 1/2 teaspoon of runny dys I items to pt with multiple swallows and "hock' every 3 presentations. Pt with WNL timeliness of first and second swallows and slowing third swallow as session progressed. SLP req'd to cue pt to "hock" and not cough due to oral nonverbal apraxia, but pt >80% successful with this spontaneously by session end. SLP educated wife and pt re: Shaker static and dynamic portions.   Pain Assessment   Pain Assessment No/denies pain   Assessment / Recommendations / Plan   Plan Continue with current plan of  care   Dysphagia Recommendations   Diet recommendations NPO  water/"hock" <30 minutes after thorough oral care   Progression Toward Goals   Progression toward goals Progressing toward goals          SLP Education - 07/04/15 1303    Education provided Yes   Education Details shaker exercise (static and dynamic)   Person(s) Educated Patient;Spouse   Methods Explanation;Demonstration   Comprehension Verbalized understanding          SLP Short Term Goals - 06/27/15 1355    SLP SHORT TERM GOAL #1   Title pt will demo efforftul swallows 60% of the time in >100 opportunities over 3 sessions   Baseline three sesions 04-26-15   Status Achieved   SLP SHORT TERM GOAL #2   Title pt will throat clear/cough with hydrophonic voice with rare nonverbal cues   Status Achieved   SLP SHORT TERM GOAL #3   Title pt will complete HEP for dysphagia with modified independence (demo cues)   Status Achieved          SLP Long Term Goals - 07/04/15 1305    SLP LONG TERM GOAL #1   Title pt will demo effortful swallows 75% of the time in >120 opportunities over 3 sessions   Status Achieved   SLP LONG TERM GOAL #2   Title pt will complete dysphagia HEP with modified independence over 3 sessions   Baseline two sessions 05-16-15   Status Achieved   SLP LONG TERM GOAL #3   Title pt will clear voice/cough with hydrophonic voice independently 90% of the time   Status Achieved   SLP LONG TERM GOAL #4   Title pt/wife will indicate 3 s/s aspiration PNA   Status Achieved   SLP LONG TERM GOAL #5   Title pt to demo effortful swallow in 85% of swallows up to 25 minutes after NMES placement   Status Achieved   SLP LONG TERM GOAL #6   Title pt to demo effortful swallow in 90% of swallows in first 30 minutes after NMES placement   Time 4   Period Weeks   Status Not Met  Goal to continue for new reporting period (until 06-29-15)   SLP LONG TERM GOAL #7   Title with NMES placed, pt will exhibit wet voice no  more than 10% of the time after initial swallow of POs over three sessions   Status Achieved   SLP LONG TERM GOAL #8   Title pt will follow swallow precautions indicated on modfied barium swallow report 07-03-15   Time 4   Period Weeks   Status On-going  SLP LONG TERM GOAL  #9   TITLE pt will demo effortful swallow 90% with rare min A in first 30 minutes after NMES placed over 3 sessions   Time 4   Period Weeks   Status On-going   SLP LONG TERM GOAL  #10   TITLE pt to perform HEP with rare min A (SHaker, MAsako, Mendelsohn)   Time 4   Period Weeks   Status New          Plan - 07/04/15 1303    Clinical Impression Statement  Pt remains without s/s aspiration PNA and pt/wife have stopped 1/4 cup pureed POs at home and have switched to 1/2 cup water twice a day, per modified (MBSS) yesterday. Pt cont to demo reduced swallow strength (see MBSS for details) and would benefit from cont skilled ST x3/week utilizing strengthening exercises as well as POs including but not limited to the use of NMES. Goal changes are made today to reflect MBSS yesterday.   Speech Therapy Frequency 3x / week   Duration 4 weeks   Treatment/Interventions Aspiration precaution training;Pharyngeal strengthening exercises;Diet toleration management by SLP;NMES;Cueing hierarchy;Functional tasks;Patient/family education;Compensatory strategies;SLP instruction and feedback;Language facilitation   Potential to Achieve Goals Good   Potential Considerations Severity of impairments      Patient will benefit from skilled therapeutic intervention in order to improve the following deficits and impairments:   Dysphagia  Aphasia following cerebral infarction    Problem List Patient Active Problem List   Diagnosis Date Noted  . Cerebral infarction due to embolism of left middle cerebral artery ([place]) 04/18/2015  . Chronic anticoagulation 04/18/2015  . HLD (hyperlipidemia) 04/18/2015  . Persistent atrial fibrillation  ([place]) 04/18/2015  . Gait disturbance, post-stroke 03/29/2015  . Atrial fibrillation with RVR ([place]) 03/13/2015  . Dysphagia   . Left middle cerebral artery stroke ([place]) 02/02/2015  . Dysphagia, pharyngoesophageal phase 02/02/2015  . Aphasia due to recent cerebrovascular accident 02/02/2015  . Bacterial pneumonia   . Stroke with cerebral ischemia ([place])   . Stroke ([place]) 01/31/2015  . CVA (cerebral infarction) 01/31/2015  . Aphasia   . Hypokalemia   . CAP (community acquired pneumonia) 01/26/2015  . Pulmonary fibrosis ([place]) 01/26/2015  . PCP NOTES >>>>>>>>>>>> 12/14/2014  . Vitamin D deficiency 11/04/2013  . Vasomotor rhinitis 09/22/2013  . Diarrhea 07/08/2013  . Crohn's disease ([place]) 07/08/2013  . Postinflammatory pulmonary fibrosis ([place]) 05/26/2013  . Anxiety state 05/20/2013  . Sprain of shoulder, left 06/01/2012  . Personal history of colonic polyps 04/12/2012  . Pulmonary nodules 03/02/2011  . Cough 03/02/2011  . Long term (current) use of anticoagulants 02/21/2011  . Atrial fibrillation with controlled ventricular response ([place]) 02/17/2011  . Annual physical exam 12/23/2010  . Dyspnea 09/03/2010  . HEMORRHOIDS-INTERNAL 03/08/2010  . [place] INTESTINE 03/08/2010  . RECTAL BLEEDING 03/08/2010  . HEMATOCHEZIA 03/06/2010  . Essential hypertension 02/04/2010  . HYPERTROPHY PROSTATE W/O UR OBST & OTH LUTS 12/10/2009  . Osteopenia 12/07/2009  . PERIPHERAL NEUROPATHY 08/29/2009  . FECAL INCONTINENCE 01/29/2009  . OSTEOARTHRITIS, ANKLE, RIGHT 11/01/2008  . ERECTILE DYSFUNCTION 12/02/2007  . TREMOR 12/02/2007  . CARCINOMA, SKIN, SQUAMOUS CELL 09/16/2006  . B12 DEFICIENCY 08/27/2006  . HYPERTRIGLYCERIDEMIA 08/27/2006    [place] ,[place], [place]  07/04/2015, 1:07 PM  [facility] [address] [place], [place], [address] Phone: [phone]   Fax:  [phone]   Name: Brian [name] MRN:  [id number] Date of Birth: 1928/12/22

## 2015-07-06 DIAGNOSIS — J9611 Chronic respiratory failure with hypoxia: Secondary | ICD-10-CM | POA: Diagnosis not present

## 2015-07-06 DIAGNOSIS — R0602 Shortness of breath: Secondary | ICD-10-CM | POA: Diagnosis not present

## 2015-07-06 DIAGNOSIS — J8410 Pulmonary fibrosis, unspecified: Secondary | ICD-10-CM | POA: Diagnosis not present

## 2015-07-06 DIAGNOSIS — Z79899 Other long term (current) drug therapy: Secondary | ICD-10-CM | POA: Diagnosis not present

## 2015-07-06 DIAGNOSIS — J439 Emphysema, unspecified: Secondary | ICD-10-CM | POA: Diagnosis not present

## 2015-07-06 DIAGNOSIS — J84112 Idiopathic pulmonary fibrosis: Secondary | ICD-10-CM | POA: Diagnosis not present

## 2015-07-09 ENCOUNTER — Ambulatory Visit: Payer: Medicare Other

## 2015-07-09 DIAGNOSIS — R1310 Dysphagia, unspecified: Secondary | ICD-10-CM

## 2015-07-09 DIAGNOSIS — J9611 Chronic respiratory failure with hypoxia: Secondary | ICD-10-CM | POA: Diagnosis not present

## 2015-07-09 DIAGNOSIS — I69320 Aphasia following cerebral infarction: Secondary | ICD-10-CM

## 2015-07-09 NOTE — Therapy (Signed)
[facility] [address] [place], [place], [address] Phone: [phone]   Fax:  [phone]  Speech Language Pathology Treatment  Patient Details  Name: Brian [name] MRN: [id number] Date of Birth: 1929/02/23 Referring Provider: Rosalin Hawking M.D.  Encounter Date: 07/09/2015      End of Session - 07/09/15 1204    Visit Number 39   Number of Visits 74   Date for SLP Re-Evaluation 08/03/15   SLP Start Time 1103   SLP Stop Time  1145   SLP Time Calculation (min) 42 min   Activity Tolerance Patient tolerated treatment well      Past Medical History  Diagnosis Date  . Muscle tremor     saw neurology remotely elsewhere, was Rx inderal  . Crohn's ileocolitis ([place])   . Osteopenia     per DEXA 11/2008  . Hypertension 11/11    mild  . Hemorrhoids   . Hypertrophy of prostate     w/o UR obst & oth luts  . Peripheral neuropathy (HCC)     h/o neg w/u  . ED (erectile dysfunction)   . Hypertriglyceridemia   . COPD (chronic obstructive pulmonary disease) ([place])     recent dx--no acute problems  . Hiatal hernia 1956    found while in service  . Anxiety   . Atrial fibrillation with RVR ([place])   . Long term (current) use of anticoagulants 02/21/2011  . Serrated adenoma of colon 03/2010  . Renal cyst     bilateral  . Pulmonary fibrosis ([place]) 2015    Rx Esbriet ~ 10-2013 ([name]), Dr. [name]  . On home oxygen therapy     "2L; 20-24h for the past week" (01/26/2015)  . Emphysema lung ([place]) 2016    Dr. [name]  . B12 deficiency anemia   . Idiopathic pulmonary fibrosis ([place]) 2016    Dr. [name]  . Pneumonia ?2015  . CAP (community acquired pneumonia) 01/26/2015  . Osteoarthritis     "maybe in my hands, feet" (01/26/2015)  . Depression   . Squamous cell carcinoma of skin of scalp     tx'd ~ 23yr; "froze them off"  . Squamous cell carcinoma, face      "froze them off"  . Melanoma of scalp ([facility]   . Stroke ([facility]     Past  Surgical History  Procedure Laterality Date  . Colon resection  1984    resection of distal ileum and cecum w/ appendectomy, 18-inch small intestine, for Crohn's disease  . Colon surgery    . Appendectomy  1984  . Cataract extraction w/ intraocular lens  implant, bilateral Bilateral 2007  . Cholecystectomy  02/17/2011    Procedure: LAPAROSCOPIC CHOLECYSTECTOMY WITH INTRAOPERATIVE CHOLANGIOGRAM;  Surgeon: BEdward Jolly MD;  Location: [facility];  Service: General;  Laterality: N/A;  . Mohs surgery Right ~ 2014    "side of my scalp"    There were no vitals filed for this visit.             ADULT SLP TREATMENT - 07/09/15 1111    General Information   Behavior/Cognition Alert;Cooperative;Pleasant mood;Requires cueing   Treatment Provided   Treatment provided Dysphagia   Dysphagia Treatment   Temperature Spikes Noted No   Respiratory Status Nasal cannula   Oral Cavity - Dentition Dentures, top;Dentures, bottom   Treatment Methods Skilled observation;Therapeutic exercise   Patient observed directly with PO's Yes   Type of PO's observed Thin liquids;Dysphagia 1 (puree)  Pharyngeal Phase Signs & Symptoms Wet vocal quality  as session progressed, rarely   Other treatment/comments Placement 3b with mA 10.5 with NMES, with runny puree foods. Pt with spontaneous "hock" bringing up food-color tinged secretions, consistently. Purees at 1/2 teaspoon boluses. Pt also had two sips water at end of session with "hock" spontaneously. Pt brought up minimal tinged material, mostly clear, each time. Effortful swallow 90% with min-mod cues rarely.   Assessment / Recommendations / Plan   Plan Continue with current plan of care   Dysphagia Recommendations   Diet recommendations NPO  water/ice chips with precautions <30 minutes after oral care   Progression Toward Goals   Progression toward goals Progressing toward goals            SLP Short Term Goals - 06/27/15 1355    SLP SHORT TERM  GOAL #1   Title pt will demo efforftul swallows 60% of the time in >100 opportunities over 3 sessions   Baseline three sesions 04-26-15   Status Achieved   SLP SHORT TERM GOAL #2   Title pt will throat clear/cough with hydrophonic voice with rare nonverbal cues   Status Achieved   SLP SHORT TERM GOAL #3   Title pt will complete HEP for dysphagia with modified independence (demo cues)   Status Achieved          SLP Long Term Goals - 07/09/15 1206    SLP LONG TERM GOAL #1   Title pt will demo effortful swallows 75% of the time in >120 opportunities over 3 sessions   Status Achieved   SLP LONG TERM GOAL #2   Title pt will complete dysphagia HEP with modified independence over 3 sessions   Baseline two sessions 05-16-15   Status Achieved   SLP LONG TERM GOAL #3   Title pt will clear voice/cough with hydrophonic voice independently 90% of the time   Status Achieved   SLP LONG TERM GOAL #4   Title pt/wife will indicate 3 s/s aspiration PNA   Status Achieved   SLP LONG TERM GOAL #5   Title pt to demo effortful swallow in 85% of swallows up to 25 minutes after NMES placement   Status Achieved   SLP LONG TERM GOAL #6   Title pt to demo effortful swallow in 90% of swallows in first 30 minutes after NMES placement   Time 4   Period Weeks   Status Not Met  Goal to continue for new reporting period (until 06-29-15)   SLP LONG TERM GOAL #7   Title with NMES placed, pt will exhibit wet voice no more than 10% of the time after initial swallow of POs over three sessions   Status Achieved   SLP LONG TERM GOAL #8   Title pt will follow swallow precautions indicated on modfied barium swallow report 07-03-15, over 3 sessions   Baseline one session 07-09-15   Time 3   Period Weeks   Status Revised   SLP LONG TERM GOAL  #9   TITLE pt will demo effortful swallow 90% with rare min A in first 30 minutes after NMES placed over 3 sessions   Time 3   Period Weeks   Status On-going   SLP LONG TERM  GOAL  #10   TITLE pt to perform HEP with rare min A (SHaker, MAsako, Mendelsohn)   Time 3   Period Weeks   Status New          Plan - 07/09/15 1204  Clinical Impression Statement  Pt remains without s/s aspiration PNA. Pt/wife doing 1/2 cup water and one 1/2 cup ice chips per day, wiht precautions from modified barium swallow last week. Pt cont to demo reduced swallow strength (see MBSS for details) and would benefit from cont skilled ST x3/week utilizing strengthening exercises as well as POs including but not limited to the use of NMES.    Speech Therapy Frequency 3x / week   Duration 4 weeks  3 weeks   Treatment/Interventions Aspiration precaution training;Pharyngeal strengthening exercises;Diet toleration management by SLP;NMES;Cueing hierarchy;Functional tasks;Patient/family education;Compensatory strategies;SLP instruction and feedback;Language facilitation   Potential to Achieve Goals Good   Potential Considerations Severity of impairments      Patient will benefit from skilled therapeutic intervention in order to improve the following deficits and impairments:   Dysphagia  Aphasia following cerebral infarction    Problem List Patient Active Problem List   Diagnosis Date Noted  . Cerebral infarction due to embolism of left middle cerebral artery ([place]) 04/18/2015  . Chronic anticoagulation 04/18/2015  . HLD (hyperlipidemia) 04/18/2015  . Persistent atrial fibrillation ([place]) 04/18/2015  . Gait disturbance, post-stroke 03/29/2015  . Atrial fibrillation with RVR ([place]) 03/13/2015  . Dysphagia   . Left middle cerebral artery stroke ([place]) 02/02/2015  . Dysphagia, pharyngoesophageal phase 02/02/2015  . Aphasia due to recent cerebrovascular accident 02/02/2015  . Bacterial pneumonia   . Stroke with cerebral ischemia ([place])   . Stroke ([place]) 01/31/2015  . CVA (cerebral infarction) 01/31/2015  . Aphasia   . Hypokalemia   . CAP (community acquired pneumonia) 01/26/2015  .  Pulmonary fibrosis ([place]) 01/26/2015  . PCP NOTES >>>>>>>>>>>> 12/14/2014  . Vitamin D deficiency 11/04/2013  . Vasomotor rhinitis 09/22/2013  . Diarrhea 07/08/2013  . Crohn's disease ([place]) 07/08/2013  . Postinflammatory pulmonary fibrosis ([place]) 05/26/2013  . Anxiety state 05/20/2013  . Sprain of shoulder, left 06/01/2012  . Personal history of colonic polyps 04/12/2012  . Pulmonary nodules 03/02/2011  . Cough 03/02/2011  . Long term (current) use of anticoagulants 02/21/2011  . Atrial fibrillation with controlled ventricular response ([place]) 02/17/2011  . Annual physical exam 12/23/2010  . Dyspnea 09/03/2010  . HEMORRHOIDS-INTERNAL 03/08/2010  . [place] INTESTINE 03/08/2010  . RECTAL BLEEDING 03/08/2010  . HEMATOCHEZIA 03/06/2010  . Essential hypertension 02/04/2010  . HYPERTROPHY PROSTATE W/O UR OBST & OTH LUTS 12/10/2009  . Osteopenia 12/07/2009  . PERIPHERAL NEUROPATHY 08/29/2009  . FECAL INCONTINENCE 01/29/2009  . OSTEOARTHRITIS, ANKLE, RIGHT 11/01/2008  . ERECTILE DYSFUNCTION 12/02/2007  . TREMOR 12/02/2007  . CARCINOMA, SKIN, SQUAMOUS CELL 09/16/2006  . B12 DEFICIENCY 08/27/2006  . HYPERTRIGLYCERIDEMIA 08/27/2006    [facility] ,[place], [place]  07/09/2015, 12:08 PM  [facility] [address] [place], [place], [address] Phone: [phone]   Fax:  [phone]   Name: BUD KAESER MRN: [id number] Date of Birth: [birth year]

## 2015-07-10 ENCOUNTER — Other Ambulatory Visit: Payer: Self-pay | Admitting: Internal Medicine

## 2015-07-11 ENCOUNTER — Ambulatory Visit: Payer: Medicare Other

## 2015-07-11 DIAGNOSIS — R1310 Dysphagia, unspecified: Secondary | ICD-10-CM

## 2015-07-11 DIAGNOSIS — I69320 Aphasia following cerebral infarction: Secondary | ICD-10-CM | POA: Diagnosis not present

## 2015-07-11 NOTE — Therapy (Signed)
[facility] [address] [place], [place], [address] Phone: [phone]   Fax:  [phone]  Speech Language Pathology Treatment  Patient Details  Name: Brian [name] MRN: [id number] Date of Birth: 16-May-1928 Referring Provider: Rosalin [name] M.D.  Encounter Date: 07/11/2015      End of Session - 07/11/15 1447    Visit Number 40   Number of Visits 77   Date for SLP Re-Evaluation 08/03/15   SLP Start Time 7672   SLP Stop Time  1445   SLP Time Calculation (min) 43 min   Activity Tolerance Patient tolerated treatment well      Past Medical History  Diagnosis Date  . Muscle tremor     saw neurology remotely elsewhere, was Rx inderal  . Crohn's ileocolitis ([place])   . Osteopenia     per DEXA 11/2008  . Hypertension 11/11    mild  . Hemorrhoids   . Hypertrophy of prostate     w/o UR obst & oth luts  . Peripheral neuropathy (HCC)     h/o neg w/u  . ED (erectile dysfunction)   . Hypertriglyceridemia   . COPD (chronic obstructive pulmonary disease) ([place])     recent dx--no acute problems  . Hiatal hernia 1956    found while in service  . Anxiety   . Atrial fibrillation with RVR ([place])   . Long term (current) use of anticoagulants 02/21/2011  . Serrated adenoma of colon 03/2010  . Renal cyst     bilateral  . Pulmonary fibrosis ([place]) 2015    Rx Esbriet ~ 10-2013 ([name]), Dr. Dorothyann [name]  . On home oxygen therapy     "2L; 20-24h for the past week" (01/26/2015)  . Emphysema lung ([place]) 2016    Dr. Dorothyann [name]  . B12 deficiency anemia   . Idiopathic pulmonary fibrosis ([place]) 2016    Dr. Dorothyann [name]  . Pneumonia ?2015  . CAP (community acquired pneumonia) 01/26/2015  . Osteoarthritis     "maybe in my hands, feet" (01/26/2015)  . Depression   . Squamous cell carcinoma of skin of scalp     tx'd ~ 66yr; "froze them off"  . Squamous cell carcinoma, face      "froze them off"  . Melanoma of scalp ([place]   . Stroke ([facility]     Past  Surgical History  Procedure Laterality Date  . Colon resection  1984    resection of distal ileum and cecum w/ appendectomy, 18-inch small intestine, for Crohn's disease  . Colon surgery    . Appendectomy  1984  . Cataract extraction w/ intraocular lens  implant, bilateral Bilateral 2007  . Cholecystectomy  02/17/2011    Procedure: LAPAROSCOPIC CHOLECYSTECTOMY WITH INTRAOPERATIVE CHOLANGIOGRAM;  Surgeon: BEdward Jolly MD;  Location: [facility];  Service: General;  Laterality: N/A;  . Mohs surgery Right ~ 2014    "side of my scalp"    There were no vitals filed for this visit.      Subjective Assessment - 07/11/15 1412    Subjective "Those right there are tasty" (tomato soup)   Patient is accompained by: --  wife, Brian [name]  Currently in Pain? No/denies               ADULT SLP TREATMENT - 07/11/15 1426    General Information   Behavior/Cognition Alert;Cooperative;Pleasant mood;Requires cueing   Treatment Provided   Treatment provided Dysphagia   Dysphagia Treatment   Temperature Spikes Noted No  Respiratory Status Nasal cannula   Oral Cavity - Dentition Dentures, top;Dentures, bottom   Treatment Methods Skilled observation;Therapeutic exercise   Patient observed directly with PO's Yes   Type of PO's observed Thin liquids;Dysphagia 1 (puree)  runny dys I   Other treatment/comments Placement 3a with with mA 10.5.  Runny dys I items and 6 sips H2O. Pt "hocking" throughout session, incr'd residual present on napkin as session progressed.  3rd swallow demo'd as delayed (pumping noted) after 15 minutes. After 25 minutes, pt with more frequent hydrophonic voice. When pt had wet voice he req'd cues to clear 50% of the time.   Assessment / Recommendations / Plan   Plan Continue with current plan of care   Dysphagia Recommendations   Diet recommendations NPO  H2O or ice chips <30 minutes after oral care   Progression Toward Goals   Progression toward goals Progressing toward  goals            SLP Short Term Goals - 06/27/15 1355    SLP SHORT TERM GOAL #1   Title pt will demo efforftul swallows 60% of the time in >100 opportunities over 3 sessions   Baseline three sesions 04-26-15   Status Achieved   SLP SHORT TERM GOAL #2   Title pt will throat clear/cough with hydrophonic voice with rare nonverbal cues   Status Achieved   SLP SHORT TERM GOAL #3   Title pt will complete HEP for dysphagia with modified independence (demo cues)   Status Achieved          SLP Long Term Goals - 07/11/15 1450    SLP LONG TERM GOAL #1   Title pt will demo effortful swallows 75% of the time in >120 opportunities over 3 sessions   Status Achieved   SLP LONG TERM GOAL #2   Title pt will complete dysphagia HEP with modified independence over 3 sessions   Baseline two sessions 05-16-15   Status Achieved   SLP LONG TERM GOAL #3   Title pt will clear voice/cough with hydrophonic voice independently 90% of the time   Status Achieved   SLP LONG TERM GOAL #4   Title pt/wife will indicate 3 s/s aspiration PNA   Status Achieved   SLP LONG TERM GOAL #5   Title pt to demo effortful swallow in 85% of swallows up to 25 minutes after NMES placement   Status Achieved   SLP LONG TERM GOAL #6   Title pt to demo effortful swallow in 90% of swallows in first 30 minutes after NMES placement   Time 4   Period Weeks   Status Not Met  Goal to continue for new reporting period (until 06-29-15)   SLP LONG TERM GOAL #7   Title with NMES placed, pt will exhibit wet voice no more than 10% of the time after initial swallow of POs over three sessions   Status Achieved   SLP LONG TERM GOAL #8   Title pt will follow swallow precautions indicated on modfied barium swallow report 07-03-15, over 3 sessions   Baseline one session 07-09-15   Time 3   Period Weeks   Status Revised   SLP LONG TERM GOAL  #9   TITLE pt will demo effortful swallow 90% with rare min A in first 30 minutes after NMES placed  over 3 sessions   Time 3   Period Weeks   Status On-going   SLP LONG TERM GOAL  #10   TITLE pt to  perform HEP with rare min A (SHaker, MAsako, Mendelsohn)   Time 3   Period Weeks   Status On-going          Plan - 2015-07-29 1448    Clinical Impression Statement  Pt remains without s/s aspiration PNA. Pt/wife doing 1/2 cup water and one 1/2 cup ice chips per day, wiht precautions from modified barium swallow last week. Pt cont to demo reduced swallow strength (see MBSS for details) demo'd today by incr'd frequency of third swallow tongue pumping. He would benefit from cont skilled ST x3/week utilizing strengthening exercises as well as POs including but not limited to the use of NMES.    Speech Therapy Frequency 3x / week   Duration --  3 weeks   Treatment/Interventions Aspiration precaution training;Pharyngeal strengthening exercises;Diet toleration management by SLP;NMES;Cueing hierarchy;Functional tasks;Patient/family education;Compensatory strategies;SLP instruction and feedback;Language facilitation   Potential to Achieve Goals Good   Potential Considerations Severity of impairments      Patient will benefit from skilled therapeutic intervention in order to improve the following deficits and impairments:   Dysphagia  Aphasia following cerebral infarction      G-Codes - 2015-07-29 1450    Functional Assessment Tool Used mbs report   Functional Limitations Swallowing   Swallow Current Status (Q6834) At least 80 percent but less than 100 percent impaired, limited or restricted   Swallow Goal Status (H9622) At least 60 percent but less than 80 percent impaired, limited or restricted     Speech Therapy Progress Note  Dates of Reporting Period: 06-15-15 to present  Subjective Statement: Pt has now undergone 40 ST sessions for dysphagia.   Objective Measurements: A recent modified barium swallow cont'd to rec NPO but with water after feeding and <30 minutes after thorough oral  care. Pt has improved swallow strength over the last 10 visits, especially seen with third swallows.  Goal Update: See goal update for details, above. After modified barium swallow exam goals were modified to reflect those findings.  Plan: See pt for at least another 2-3 weeks and assess whether progress warrants further therapy.  Reason Skilled Services are Required: Pt's swallow status has improved since initiation of therapy, and pt would greatly enjoy the chance for POs regularly.    Problem List Patient Active Problem List   Diagnosis Date Noted  . Cerebral infarction due to embolism of left middle cerebral artery ([place]) 04/18/2015  . Chronic anticoagulation 04/18/2015  . HLD (hyperlipidemia) 04/18/2015  . Persistent atrial fibrillation ([place]) 04/18/2015  . Gait disturbance, post-stroke 03/29/2015  . Atrial fibrillation with RVR ([place]) 03/13/2015  . Dysphagia   . Left middle cerebral artery stroke ([place]) 02/02/2015  . Dysphagia, pharyngoesophageal phase 02/02/2015  . Aphasia due to recent cerebrovascular accident 02/02/2015  . Bacterial pneumonia   . Stroke with cerebral ischemia ([place])   . Stroke ([place]) 01/31/2015  . CVA (cerebral infarction) 01/31/2015  . Aphasia   . Hypokalemia   . CAP (community acquired pneumonia) 01/26/2015  . Pulmonary fibrosis ([place]) 01/26/2015  . PCP NOTES >>>>>>>>>>>> 12/14/2014  . Vitamin D deficiency 11/04/2013  . Vasomotor rhinitis 09/22/2013  . Diarrhea 07/08/2013  . Crohn's disease ([place]) 07/08/2013  . Postinflammatory pulmonary fibrosis ([place]) 05/26/2013  . Anxiety state 05/20/2013  . Sprain of shoulder, left 06/01/2012  . Personal history of colonic polyps 04/12/2012  . Pulmonary nodules 03/02/2011  . Cough 03/02/2011  . Long term (current) use of anticoagulants 02/21/2011  . Atrial fibrillation with controlled ventricular response ([place]) 02/17/2011  .  Annual physical exam 12/23/2010  . Dyspnea 09/03/2010  . HEMORRHOIDS-INTERNAL 03/08/2010   . [place] INTESTINE 03/08/2010  . RECTAL BLEEDING 03/08/2010  . HEMATOCHEZIA 03/06/2010  . Essential hypertension 02/04/2010  . HYPERTROPHY PROSTATE W/O UR OBST & OTH LUTS 12/10/2009  . Osteopenia 12/07/2009  . PERIPHERAL NEUROPATHY 08/29/2009  . FECAL INCONTINENCE 01/29/2009  . OSTEOARTHRITIS, ANKLE, RIGHT 11/01/2008  . ERECTILE DYSFUNCTION 12/02/2007  . TREMOR 12/02/2007  . CARCINOMA, SKIN, SQUAMOUS CELL 09/16/2006  . B12 DEFICIENCY 08/27/2006  . HYPERTRIGLYCERIDEMIA 08/27/2006    [facility] [place]  07/11/2015, 5:10 PM  [facility] [address] [place], [place], [address] Phone: [phone]   Fax:  [phone]   Name: Brian [name] MRN: [id number] Date of Birth: Jun 11, 1928

## 2015-07-13 ENCOUNTER — Ambulatory Visit: Payer: Medicare Other

## 2015-07-13 DIAGNOSIS — I69320 Aphasia following cerebral infarction: Secondary | ICD-10-CM | POA: Diagnosis not present

## 2015-07-13 DIAGNOSIS — R1310 Dysphagia, unspecified: Secondary | ICD-10-CM | POA: Diagnosis not present

## 2015-07-13 NOTE — Therapy (Signed)
[facility] [address] [place], [place], [address] Phone: [phone]   Fax:  [phone]  Speech Language Pathology Treatment  Patient Details  Name: Brian [name] MRN: [id number] Date of Birth: 1928-07-05 Referring Provider: Rosalin Hawking M.D.  Encounter Date: 07/13/2015      End of Session - 07/13/15 1147    Visit Number 41   Number of Visits 82   Date for SLP Re-Evaluation 08/03/15   SLP Start Time 1103   SLP Stop Time  1145   SLP Time Calculation (min) 42 min   Activity Tolerance Patient tolerated treatment well      Past Medical History  Diagnosis Date  . Muscle tremor     saw neurology remotely elsewhere, was Rx inderal  . Crohn's ileocolitis ([place])   . Osteopenia     per DEXA 11/2008  . Hypertension 11/11    mild  . Hemorrhoids   . Hypertrophy of prostate     w/o UR obst & oth luts  . Peripheral neuropathy (HCC)     h/o neg w/u  . ED (erectile dysfunction)   . Hypertriglyceridemia   . COPD (chronic obstructive pulmonary disease) ([place])     recent dx--no acute problems  . Hiatal hernia 1956    found while in service  . Anxiety   . Atrial fibrillation with RVR ([place])   . Long term (current) use of anticoagulants 02/21/2011  . Serrated adenoma of colon 03/2010  . Renal cyst     bilateral  . Pulmonary fibrosis ([place]) 2015    Rx Esbriet ~ 10-2013 ([name]), Dr. [name]  . On home oxygen therapy     "2L; 20-24h for the past week" (01/26/2015)  . Emphysema lung ([place]) 2016    Dr. [name]  . B12 deficiency anemia   . Idiopathic pulmonary fibrosis ([place]) 2016    Dr. [name]  . Pneumonia ?2015  . CAP (community acquired pneumonia) 01/26/2015  . Osteoarthritis     "maybe in my hands, feet" (01/26/2015)  . Depression   . Squamous cell carcinoma of skin of scalp     tx'd ~ 24yr; "froze them off"  . Squamous cell carcinoma, face      "froze them off"  . [place]anoma of [place] ([place]   . Stroke ([facility]     Past  Surgical History  Procedure Laterality Date  . Colon resection  1984    resection of distal ileum and cecum w/ appendectomy, 18-inch small intestine, for Crohn's disease  . Colon surgery    . Appendectomy  1984  . Cataract extraction w/ intraocular lens  implant, bilateral Bilateral 2007  . Cholecystectomy  02/17/2011    Procedure: LAPAROSCOPIC CHOLECYSTECTOMY WITH INTRAOPERATIVE CHOLANGIOGRAM;  Surgeon: BEdward Jolly MD;  Location: [facility];  Service: General;  Laterality: N/A;  . Mohs surgery Right ~ 2014    "side of my scalp"    There were no vitals filed for this visit.      Subjective Assessment - 07/13/15 1119    Subjective "Got a good flavor - potatoes." (pt, re: tomatoes)   Patient is accompained by: --  wife, jean   Currently in Pain? No/denies               ADULT SLP TREATMENT - 07/13/15 1124    General Information   Behavior/Cognition Alert;Cooperative;Pleasant mood;Requires cueing   Treatment Provided   Treatment provided Dysphagia   Dysphagia Treatment   Temperature Spikes Noted No  Respiratory Status Nasal cannula   Oral Cavity - Dentition Dentures, top;Dentures, bottom   Treatment Methods Skilled observation;Therapeutic exercise   Patient observed directly with PO's Yes   Type of PO's observed Thin liquids;Dysphagia 1 (puree)   Pharyngeal Phase Signs & Symptoms Wet vocal quality  rarely, req'd SLP cues for cough/swallow 3/5 times   Other treatment/comments NMES placement 3b with mA=10.0 and funny dys I items, and H2O. Multiple swallows, effortful swallows and "hock" all done as compensations ("Hock" done with cues initially but spontaneous after that). Half teaspoon boluses.    Assessment / Recommendations / Plan   Plan Continue with current plan of care   Dysphagia Recommendations   Diet recommendations NPO   Progression Toward Goals   Progression toward goals Progressing toward goals            SLP Short Term Goals - 06/27/15 1355     SLP SHORT TERM GOAL #1   Title pt will demo efforftul swallows 60% of the time in >100 opportunities over 3 sessions   Baseline three sesions 04-26-15   Status Achieved   SLP SHORT TERM GOAL #2   Title pt will throat clear/cough with hydrophonic voice with rare nonverbal cues   Status Achieved   SLP SHORT TERM GOAL #3   Title pt will complete HEP for dysphagia with modified independence (demo cues)   Status Achieved          SLP Long Term Goals - 07/13/15 1147    SLP LONG TERM GOAL #1   Title pt will demo effortful swallows 75% of the time in >120 opportunities over 3 sessions   Status Achieved   SLP LONG TERM GOAL #2   Title pt will complete dysphagia HEP with modified independence over 3 sessions   Baseline two sessions 05-16-15   Status Achieved   SLP LONG TERM GOAL #3   Title pt will clear voice/cough with hydrophonic voice independently 90% of the time   Status Achieved   SLP LONG TERM GOAL #4   Title pt/wife will indicate 3 s/s aspiration PNA   Status Achieved   SLP LONG TERM GOAL #5   Title pt to demo effortful swallow in 85% of swallows up to 25 minutes after NMES placement   Status Achieved   SLP LONG TERM GOAL #6   Title pt to demo effortful swallow in 90% of swallows in first 30 minutes after NMES placement   Time 4   Period Weeks   Status Not Met  Goal to continue for new reporting period (until 06-29-15)   SLP LONG TERM GOAL #7   Title with NMES placed, pt will exhibit wet voice no more than 10% of the time after initial swallow of POs over three sessions   Status Achieved   SLP LONG TERM GOAL #8   Title pt will follow swallow precautions indicated on modfied barium swallow report 07-03-15, over 3 sessions   Baseline one session 07-09-15   Time 3   Period Weeks   Status Revised   SLP LONG TERM GOAL  #9   TITLE pt will demo effortful swallow 90% with rare min A in first 30 minutes after NMES placed over 3 sessions   Time 3   Period Weeks   Status On-going    SLP LONG TERM GOAL  #10   TITLE pt to perform HEP with rare min A (SHaker, MAsako, Mendelsohn)   Time 3   Period Weeks   Status On-going  Plan - 07/13/15 1147    Clinical Impression Statement  Pt remains without s/s aspiration PNA. Pt/wife doing 1/2 cup water and one 1/2 cup ice chips per day, wiht precautions from modified barium swallow last week. Pt cont to demo reduced swallow strength (see MBSS for details) demo'd today by incr'd frequency of third swallow tongue pumping. He would benefit from cont skilled ST x3/week utilizing strengthening exercises as well as POs including but not limited to the use of NMES.    Speech Therapy Frequency 3x / week   Duration --  3 weeks   Treatment/Interventions Aspiration precaution training;Pharyngeal strengthening exercises;Diet toleration management by SLP;NMES;Cueing hierarchy;Functional tasks;Patient/family education;Compensatory strategies;SLP instruction and feedback;Language facilitation   Potential to Achieve Goals Good   Potential Considerations Severity of impairments      Patient will benefit from skilled therapeutic intervention in order to improve the following deficits and impairments:   Dysphagia  Aphasia following cerebral infarction    Problem List Patient Active Problem List   Diagnosis Date Noted  . Cerebral infarction due to embolism of left middle cerebral artery ([place]) 04/18/2015  . Chronic anticoagulation 04/18/2015  . HLD (hyperlipidemia) 04/18/2015  . Persistent atrial fibrillation ([place]) 04/18/2015  . Gait disturbance, post-stroke 03/29/2015  . Atrial fibrillation with RVR ([place]) 03/13/2015  . Dysphagia   . Left middle cerebral artery stroke ([place]) 02/02/2015  . Dysphagia, pharyngoesophageal phase 02/02/2015  . Aphasia due to recent cerebrovascular accident 02/02/2015  . Bacterial pneumonia   . Stroke with cerebral ischemia ([place])   . Stroke ([place]) 01/31/2015  . CVA (cerebral infarction) 01/31/2015  .  Aphasia   . Hypokalemia   . CAP (community acquired pneumonia) 01/26/2015  . Pulmonary fibrosis ([place]) 01/26/2015  . PCP NOTES >>>>>>>>>>>> 12/14/2014  . Vitamin D deficiency 11/04/2013  . Vasomotor rhinitis 09/22/2013  . Diarrhea 07/08/2013  . Crohn's disease ([place]) 07/08/2013  . Postinflammatory pulmonary fibrosis ([place]) 05/26/2013  . Anxiety state 05/20/2013  . Sprain of shoulder, left 06/01/2012  . Personal history of colonic polyps 04/12/2012  . Pulmonary nodules 03/02/2011  . Cough 03/02/2011  . Long term (current) use of anticoagulants 02/21/2011  . Atrial fibrillation with controlled ventricular response ([place]) 02/17/2011  . Annual physical exam 12/23/2010  . Dyspnea 09/03/2010  . HEMORRHOIDS-INTERNAL 03/08/2010  . [place] INTESTINE 03/08/2010  . RECTAL BLEEDING 03/08/2010  . HEMATOCHEZIA 03/06/2010  . Essential hypertension 02/04/2010  . HYPERTROPHY PROSTATE W/O UR OBST & OTH LUTS 12/10/2009  . Osteopenia 12/07/2009  . PERIPHERAL NEUROPATHY 08/29/2009  . FECAL INCONTINENCE 01/29/2009  . OSTEOARTHRITIS, ANKLE, RIGHT 11/01/2008  . ERECTILE DYSFUNCTION 12/02/2007  . TREMOR 12/02/2007  . CARCINOMA, SKIN, SQUAMOUS CELL 09/16/2006  . B12 DEFICIENCY 08/27/2006  . HYPERTRIGLYCERIDEMIA 08/27/2006    [facility] ,[place], [place]   07/13/2015, 11:48 AM  [facility] [address] [place], [place], [address] Phone: [phone]   Fax:  [phone]   Name: TARL CEPHAS MRN: [id number] Date of Birth: [birth year]

## 2015-07-16 ENCOUNTER — Ambulatory Visit: Payer: Medicare Other

## 2015-07-16 DIAGNOSIS — R1310 Dysphagia, unspecified: Secondary | ICD-10-CM | POA: Diagnosis not present

## 2015-07-16 DIAGNOSIS — I69320 Aphasia following cerebral infarction: Secondary | ICD-10-CM

## 2015-07-16 NOTE — Therapy (Signed)
[facility] [address] [place], [place], [address] Phone: [phone]   Fax:  [phone]  Speech Language Pathology Treatment  Patient Details  Name: Brian [name] MRN: [id number] Date of Birth: 16-Jan-1929 Referring Provider: Rosalin Hawking M.D.  Encounter Date: 07/16/2015      End of Session - 07/16/15 1228    Visit Number 42   Number of Visits 52   Date for SLP Re-Evaluation 08/03/15   SLP Start Time 1105   SLP Stop Time  1145   SLP Time Calculation (min) 40 min   Activity Tolerance Patient tolerated treatment well      Past Medical History  Diagnosis Date  . Muscle tremor     saw neurology remotely elsewhere, was Rx inderal  . Crohn's ileocolitis ([place])   . Osteopenia     per DEXA 11/2008  . Hypertension 11/11    mild  . Hemorrhoids   . Hypertrophy of prostate     w/o UR obst & oth luts  . Peripheral neuropathy (HCC)     h/o neg w/u  . ED (erectile dysfunction)   . Hypertriglyceridemia   . COPD (chronic obstructive pulmonary disease) ([place])     recent dx--no acute problems  . Hiatal hernia 1956    found while in service  . Anxiety   . Atrial fibrillation with RVR ([place])   . Long term (current) use of anticoagulants 02/21/2011  . Serrated adenoma of colon 03/2010  . Renal cyst     bilateral  . Pulmonary fibrosis ([place]) 2015    Rx Esbriet ~ 10-2013 ([name]), Dr. [name]  . On home oxygen therapy     "2L; 20-24h for the past week" (01/26/2015)  . Emphysema lung ([place]) 2016    Dr. [name]  . B12 deficiency anemia   . Idiopathic pulmonary fibrosis ([place]) 2016    Dr. [name]  . Pneumonia ?2015  . CAP (community acquired pneumonia) 01/26/2015  . Osteoarthritis     "maybe in my hands, feet" (01/26/2015)  . Depression   . Squamous cell carcinoma of skin of scalp     tx'd ~ 23yr; "froze them off"  . Squamous cell carcinoma, face      "froze them off"  . Melanoma of scalp ([place]   . Stroke ([facility]     Past  Surgical History  Procedure Laterality Date  . Colon resection  1984    resection of distal ileum and cecum w/ appendectomy, 18-inch small intestine, for Crohn's disease  . Colon surgery    . Appendectomy  1984  . Cataract extraction w/ intraocular lens  implant, bilateral Bilateral 2007  . Cholecystectomy  02/17/2011    Procedure: LAPAROSCOPIC CHOLECYSTECTOMY WITH INTRAOPERATIVE CHOLANGIOGRAM;  Surgeon: BEdward Jolly MD;  Location: [facility];  Service: General;  Laterality: N/A;  . Mohs surgery Right ~ 2014    "side of my scalp"    There were no vitals filed for this visit.      Subjective Assessment - 07/16/15 1132    Subjective "See I've always tried -- you'll do them always, they're over all the rest." (pt, talking about tomoatoes)               ADULT SLP TREATMENT - 07/16/15 1147    General Information   Behavior/Cognition Alert;Cooperative;Pleasant mood;Requires cueing   Treatment Provided   Treatment provided Dysphagia   Dysphagia Treatment   Temperature Spikes Noted No   Respiratory Status Nasal cannula  Oral Cavity - Dentition Dentures, top;Dentures, bottom   Treatment Methods Skilled observation;Therapeutic exercise;Patient/caregiver education   Patient observed directly with PO's Yes   Type of PO's observed Thin liquids;Dysphagia 1 (puree)  runny dys I   Pharyngeal Phase Signs & Symptoms Wet vocal quality  approx 15% of swallows, pt did not note   Other treatment/comments NMES placement 3a with mA 10.5. Pt with decr'd swallow strength with first swallow after 25 minutes (tongue pumping). Wet voice not noted by pt today, requiring SLP cues to cough/clear and swallow. Every time SLP cued pt to "hock" pt brought up dys I items into kleenex. SLP educated pt's wife that SLP hope is that material would not be brought up with "hock" over time. Taught Pt andpt's [name]. Wife achieved immediately, pt did not achieve despite 4 attempts. Wife told SLP she  would work with this at home with pt. SLP encouraged this.   Pain Assessment   Pain Assessment No/denies pain   Assessment / Recommendations / Plan   Plan Continue with current plan of care   Dysphagia Recommendations   Diet recommendations NPO   Progression Toward Goals   Progression toward goals Not progressing toward goals (comment)  slighlty more fatigue, pt not noting wet voice          SLP Education - 07/16/15 1230    Education provided Yes   Education Details [employer]) Educated Patient;Spouse   Methods Explanation;Demonstration;Verbal cues   Comprehension Need further instruction          SLP Short Term Goals - 06/27/15 1355    SLP SHORT TERM GOAL #1   Title pt will demo efforftul swallows 60% of the time in >100 opportunities over 3 sessions   Baseline three sesions 04-26-15   Status Achieved   SLP SHORT TERM GOAL #2   Title pt will throat clear/cough with hydrophonic voice with rare nonverbal cues   Status Achieved   SLP SHORT TERM GOAL #3   Title pt will complete HEP for dysphagia with modified independence (demo cues)   Status Achieved          SLP Long Term Goals - 07/16/15 1229    SLP LONG TERM GOAL #1   Title pt will demo effortful swallows 75% of the time in >120 opportunities over 3 sessions   Status Achieved   SLP LONG TERM GOAL #2   Title pt will complete dysphagia HEP with modified independence over 3 sessions   Baseline two sessions 05-16-15   Status Achieved   SLP LONG TERM GOAL #3   Title pt will clear voice/cough with hydrophonic voice independently 90% of the time   Status Achieved   SLP LONG TERM GOAL #4   Title pt/wife will indicate 3 s/s aspiration PNA   Status Achieved   SLP LONG TERM GOAL #5   Title pt to demo effortful swallow in 85% of swallows up to 25 minutes after NMES placement   Status Achieved   SLP LONG TERM GOAL #6   Title pt to demo effortful swallow in 90% of swallows in first 30 minutes after NMES  placement   Time 4   Period Weeks   Status Not Met  Goal to continue for new reporting period (until 06-29-15)   SLP LONG TERM GOAL #7   Title with NMES placed, pt will exhibit wet voice no more than 10% of the time after initial swallow of POs over three sessions   Status  Achieved   SLP LONG TERM GOAL #8   Title pt will follow swallow precautions indicated on modfied barium swallow report 07-03-15, over 3 sessions   Baseline one session 07-09-15   Time 3   Period Weeks   Status Revised   SLP LONG TERM GOAL  #9   TITLE pt will demo effortful swallow 90% with rare min A in first 30 minutes after NMES placed over 3 sessions   Time 3   Period Weeks   Status On-going   SLP LONG TERM GOAL  #10   TITLE pt to perform HEP with rare min A (SHaker, MAsako, Mendelsohn)   Time 3   Period Weeks   Status On-going          Plan - 07/16/15 1228    Clinical Impression Statement  Pt remains without s/s aspiration PNA. Pt/wife doing 1/2 cup water and one 1/2 cup ice chips per day, wiht precautions from modified barium swallow last week. Pt cont to demo reduced swallow strength (see MBSS for details) demo'd today by incr'd frequency of tongue pumping with initial and subsequent swallows. He would benefit from cont skilled ST x3/week utilizing strengthening exercises as well as POs including but not limited to the use of NMES.    Speech Therapy Frequency 3x / week   Duration 2 weeks  3 weeks   Treatment/Interventions Aspiration precaution training;Pharyngeal strengthening exercises;Diet toleration management by SLP;NMES;Cueing hierarchy;Functional tasks;Patient/family education;Compensatory strategies;SLP instruction and feedback;Language facilitation   Potential to Achieve Goals Good   Potential Considerations Severity of impairments      Patient will benefit from skilled therapeutic intervention in order to improve the following deficits and impairments:   Dysphagia  Aphasia following cerebral  infarction    Problem List Patient Active Problem List   Diagnosis Date Noted  . Cerebral infarction due to embolism of left middle cerebral artery ([place]) 04/18/2015  . Chronic anticoagulation 04/18/2015  . HLD (hyperlipidemia) 04/18/2015  . Persistent atrial fibrillation ([place]) 04/18/2015  . Gait disturbance, post-stroke 03/29/2015  . Atrial fibrillation with RVR ([place]) 03/13/2015  . Dysphagia   . Left middle cerebral artery stroke ([place]) 02/02/2015  . Dysphagia, pharyngoesophageal phase 02/02/2015  . Aphasia due to recent cerebrovascular accident 02/02/2015  . Bacterial pneumonia   . Stroke with cerebral ischemia ([place])   . Stroke ([place]) 01/31/2015  . CVA (cerebral infarction) 01/31/2015  . Aphasia   . Hypokalemia   . CAP (community acquired pneumonia) 01/26/2015  . Pulmonary fibrosis ([place]) 01/26/2015  . PCP NOTES >>>>>>>>>>>> 12/14/2014  . Vitamin D deficiency 11/04/2013  . Vasomotor rhinitis 09/22/2013  . Diarrhea 07/08/2013  . Crohn's disease ([place]) 07/08/2013  . Postinflammatory pulmonary fibrosis ([place]) 05/26/2013  . Anxiety state 05/20/2013  . Sprain of shoulder, left 06/01/2012  . Personal history of colonic polyps 04/12/2012  . Pulmonary nodules 03/02/2011  . Cough 03/02/2011  . Long term (current) use of anticoagulants 02/21/2011  . Atrial fibrillation with controlled ventricular response ([place]) 02/17/2011  . Annual physical exam 12/23/2010  . Dyspnea 09/03/2010  . HEMORRHOIDS-INTERNAL 03/08/2010  . [place] INTESTINE 03/08/2010  . RECTAL BLEEDING 03/08/2010  . HEMATOCHEZIA 03/06/2010  . Essential hypertension 02/04/2010  . HYPERTROPHY PROSTATE W/O UR OBST & OTH LUTS 12/10/2009  . Osteopenia 12/07/2009  . PERIPHERAL NEUROPATHY 08/29/2009  . FECAL INCONTINENCE 01/29/2009  . OSTEOARTHRITIS, ANKLE, RIGHT 11/01/2008  . ERECTILE DYSFUNCTION 12/02/2007  . TREMOR 12/02/2007  . CARCINOMA, SKIN, SQUAMOUS CELL 09/16/2006  . B12 DEFICIENCY 08/27/2006   .  HYPERTRIGLYCERIDEMIA 08/27/2006    [place] ,[place], Friona  07/16/2015, 12:32 PM  [facility] [address] [place], [place], [address] Phone: [phone]   Fax:  [phone]   Name: Brian [name] MRN: [id number] Date of Birth: [birth year]

## 2015-07-18 ENCOUNTER — Ambulatory Visit: Payer: Medicare Other

## 2015-07-18 DIAGNOSIS — I69320 Aphasia following cerebral infarction: Secondary | ICD-10-CM

## 2015-07-18 DIAGNOSIS — R1310 Dysphagia, unspecified: Secondary | ICD-10-CM

## 2015-07-18 NOTE — Therapy (Signed)
[facility] [address] [place], [place], [address] Phone: [phone]   Fax:  [phone]  Speech Language Pathology Treatment  Patient Details  Name: Brian [name] MRN: [id number] Date of Birth: [birth year] Referring Provider: Rosalin Hawking M.D.  Encounter Date: 07/18/2015      End of Session - 07/18/15 1130    Visit Number 43   Number of Visits 55   Date for SLP Re-Evaluation 08/03/15   SLP Start Time 14   SLP Stop Time  1100   SLP Time Calculation (min) 40 min   Activity Tolerance Patient tolerated treatment well      Past Medical History  Diagnosis Date  . Muscle tremor     saw neurology remotely elsewhere, was Rx inderal  . Crohn's ileocolitis ([place])   . Osteopenia     per DEXA 11/2008  . Hypertension 11/11    mild  . Hemorrhoids   . Hypertrophy of prostate     w/o UR obst & oth luts  . Peripheral neuropathy (HCC)     h/o neg w/u  . ED (erectile dysfunction)   . Hypertriglyceridemia   . COPD (chronic obstructive pulmonary disease) ([place])     recent dx--no acute problems  . Hiatal hernia 1956    found while in service  . Anxiety   . Atrial fibrillation with RVR ([place])   . Long term (current) use of anticoagulants 02/21/2011  . Serrated adenoma of colon 03/2010  . Renal cyst     bilateral  . Pulmonary fibrosis ([place]) 2015    Rx Esbriet ~ 10-2013 ([name]), Dr. [name]  . On home oxygen therapy     "2L; 20-24h for the past week" (01/26/2015)  . Emphysema lung ([place]) 2016    Dr. [name]  . B12 deficiency anemia   . Idiopathic pulmonary fibrosis ([place]) 2016    Dr. [name]  . Pneumonia ?2015  . CAP (community acquired pneumonia) 01/26/2015  . Osteoarthritis     "maybe in my hands, feet" (01/26/2015)  . Depression   . Squamous cell carcinoma of skin of scalp     tx'd ~ 4yr; "froze them off"  . Squamous cell carcinoma, face      "froze them off"  . Melanoma of scalp ([place]   . Stroke ([facility]     Past  Surgical History  Procedure Laterality Date  . Colon resection  1984    resection of distal ileum and cecum w/ appendectomy, 18-inch small intestine, for Crohn's disease  . Colon surgery    . Appendectomy  1984  . Cataract extraction w/ intraocular lens  implant, bilateral Bilateral 2007  . Cholecystectomy  02/17/2011    Procedure: LAPAROSCOPIC CHOLECYSTECTOMY WITH INTRAOPERATIVE CHOLANGIOGRAM;  Surgeon: BEdward Jolly MD;  Location: [facility];  Service: General;  Laterality: N/A;  . Mohs surgery Right ~ 2014    "side of my scalp"    There were no vitals filed for this visit.      Subjective Assessment - 07/18/15 1055    Currently in Pain? No/denies               ADULT SLP TREATMENT - 07/18/15 1102    General Information   Behavior/Cognition Alert;Cooperative;Pleasant mood;Requires cueing   Treatment Provided   Treatment provided Dysphagia   Dysphagia Treatment   Temperature Spikes Noted No   Respiratory Status Nasal cannula   Oral Cavity - Dentition Dentures, top;Dentures, bottom   Treatment Methods Skilled observation;Therapeutic exercise;Patient/caregiver  education   Patient observed directly with PO's Yes   Type of PO's observed Thin liquids;Dysphagia 1 (puree)  runny dys I   Pharyngeal Phase Signs & Symptoms Wet vocal quality;Immediate throat clear  wet <5% swallows-pt aware 25%(1/4); throat clear <5%   Other treatment/comments Placement 3b with NMES today with mA beginning at 10.0, eventually at 10.5. TIntermittnent tongue pumping at 12 minutes with second swallow and third swallow. Pt with spontaneous tongue base vibration ("hock") at appropriate times 85% of the time. SLP educated pt's wife re: looking at progress next week to assess whether or not to continue in tx. If not, SLP told wife to have pt cont to perform HEP at home and SLP would suggest to MD f/u swallow test in approx 4 weeks, given new exercises (Shaker and Mendelsohn).  Pt again needed cues for  wet voice. Effortful swallow 75-80% of the time.   Assessment / Recommendations / Plan   Plan Continue with current plan of care   Dysphagia Recommendations   Diet recommendations NPO  ice chips/water <30 minutes after oral care   Progression Toward Goals   Progression toward goals Not progressing toward goals (comment)          SLP Education - 07/18/15 1130    Education provided Yes   Education Details progress check next week, plans after possible d/c   Person(s) Educated Spouse   Methods Explanation   Comprehension Verbalized understanding          SLP Short Term Goals - 06/27/15 1355    SLP SHORT TERM GOAL #1   Title pt will demo efforftul swallows 60% of the time in >100 opportunities over 3 sessions   Baseline three sesions 04-26-15   Status Achieved   SLP SHORT TERM GOAL #2   Title pt will throat clear/cough with hydrophonic voice with rare nonverbal cues   Status Achieved   SLP SHORT TERM GOAL #3   Title pt will complete HEP for dysphagia with modified independence (demo cues)   Status Achieved          SLP Long Term Goals - 07/18/15 [place] #1   Title pt will demo effortful swallows 75% of the time in >120 opportunities over 3 sessions   Status Achieved   SLP LONG TERM GOAL #2   Title pt will complete dysphagia HEP with modified independence over 3 sessions   Baseline two sessions 05-16-15   Status Achieved   SLP LONG TERM GOAL #3   Title pt will clear voice/cough with hydrophonic voice independently 90% of the time   Status Achieved   SLP LONG TERM GOAL #4   Title pt/wife will indicate 3 s/s aspiration PNA   Status Achieved   SLP LONG TERM GOAL #5   Title pt to demo effortful swallow in 85% of swallows up to 25 minutes after NMES placement   Status Achieved   SLP LONG TERM GOAL #6   Title pt to demo effortful swallow in 90% of swallows in first 30 minutes after NMES placement   Time 4   Period Weeks   Status Not Met  Goal to  continue for new reporting period (until 06-29-15)   SLP LONG TERM GOAL #7   Title with NMES placed, pt will exhibit wet voice no more than 10% of the time after initial swallow of POs over three sessions   Status Achieved   SLP LONG TERM GOAL #8  Title pt will follow swallow precautions indicated on modfied barium swallow report 07-03-15, over 3 sessions   Baseline one session 07-09-15   Time 3   Period Weeks   Status Revised   SLP LONG TERM GOAL  #9   TITLE pt will demo effortful swallow 90% with rare min A in first 30 minutes after NMES placed over 3 sessions   Time 3   Period Weeks   Status On-going   SLP LONG TERM GOAL  #10   TITLE pt to perform HEP with rare min A (SHaker, MAsako, Mendelsohn)   Time 3   Period Weeks   Status On-going          Plan - 07/18/15 1130    Clinical Impression Statement  Pt remains without s/s aspiration PNA. Pt/wife doing 1/2 cup water and one 1/2 cup ice chips per day, wiht precautions from modified barium swallow last week. Pt cont to demo reduced swallow strength (see MBSS for details) demo'd today by incr'd frequency of tongue pumping with initial and subsequent swallows. He would benefit from cont skilled ST x3/week utilizing strengthening exercises as well as POs including but not limited to the use of NMES. Pt is not demonstrating ID of wet voice as well as in past, but  "hocks"    Speech Therapy Frequency 3x / week   Duration 2 weeks   Treatment/Interventions Aspiration precaution training;Pharyngeal strengthening exercises;Diet toleration management by SLP;NMES;Cueing hierarchy;Functional tasks;Patient/family education;Compensatory strategies;SLP instruction and feedback;Language facilitation   Potential to Achieve Goals Good   Potential Considerations Severity of impairments   Consulted and Agree with Plan of Care Family member/caregiver      Patient will benefit from skilled therapeutic intervention in order to improve the following  deficits and impairments:   Dysphagia  Aphasia following cerebral infarction    Problem List Patient Active Problem List   Diagnosis Date Noted  . Cerebral infarction due to embolism of left middle cerebral artery ([place]) 04/18/2015  . Chronic anticoagulation 04/18/2015  . HLD (hyperlipidemia) 04/18/2015  . Persistent atrial fibrillation ([place]) 04/18/2015  . Gait disturbance, post-stroke 03/29/2015  . Atrial fibrillation with RVR ([place]) 03/13/2015  . Dysphagia   . Left middle cerebral artery stroke ([place]) 02/02/2015  . Dysphagia, pharyngoesophageal phase 02/02/2015  . Aphasia due to recent cerebrovascular accident 02/02/2015  . Bacterial pneumonia   . Stroke with cerebral ischemia ([place])   . Stroke ([place]) 01/31/2015  . CVA (cerebral infarction) 01/31/2015  . Aphasia   . Hypokalemia   . CAP (community acquired pneumonia) 01/26/2015  . Pulmonary fibrosis ([place]) 01/26/2015  . PCP NOTES >>>>>>>>>>>> 12/14/2014  . Vitamin D deficiency 11/04/2013  . Vasomotor rhinitis 09/22/2013  . Diarrhea 07/08/2013  . Crohn's disease ([place]) 07/08/2013  . Postinflammatory pulmonary fibrosis ([place]) 05/26/2013  . Anxiety state 05/20/2013  . Sprain of shoulder, left 06/01/2012  . Personal history of colonic polyps 04/12/2012  . Pulmonary nodules 03/02/2011  . Cough 03/02/2011  . Long term (current) use of anticoagulants 02/21/2011  . Atrial fibrillation with controlled ventricular response ([place]) 02/17/2011  . Annual physical exam 12/23/2010  . Dyspnea 09/03/2010  . HEMORRHOIDS-INTERNAL 03/08/2010  . [place] INTESTINE 03/08/2010  . RECTAL BLEEDING 03/08/2010  . HEMATOCHEZIA 03/06/2010  . Essential hypertension 02/04/2010  . HYPERTROPHY PROSTATE W/O UR OBST & OTH LUTS 12/10/2009  . Osteopenia 12/07/2009  . PERIPHERAL NEUROPATHY 08/29/2009  . FECAL INCONTINENCE 01/29/2009  . OSTEOARTHRITIS, ANKLE, RIGHT 11/01/2008  . ERECTILE DYSFUNCTION 12/02/2007  . TREMOR  12/02/2007  .  CARCINOMA, SKIN, SQUAMOUS CELL 09/16/2006  . B12 DEFICIENCY 08/27/2006  . HYPERTRIGLYCERIDEMIA 08/27/2006    SCHINKE,CARL ,[place], [place]  07/18/2015, 11:45 AM  [facility] [address] [place], [place], [address] Phone: [phone]   Fax:  [phone]   Name: DERRION TRITZ MRN: [id number] Date of Birth: [birth year]

## 2015-07-20 ENCOUNTER — Ambulatory Visit: Payer: Medicare Other

## 2015-07-20 DIAGNOSIS — R1310 Dysphagia, unspecified: Secondary | ICD-10-CM | POA: Diagnosis not present

## 2015-07-20 DIAGNOSIS — I69320 Aphasia following cerebral infarction: Secondary | ICD-10-CM

## 2015-07-20 NOTE — Therapy (Signed)
[facility] [address] [place], [place], [address] Phone: [phone]   Fax:  [phone]  Speech Language Pathology Treatment  Patient Details  Name: Brian [name] MRN: [id number] Date of Birth: 05/06/1928 Referring Provider: Rosalin Hawking M.D.  Encounter Date: 07/20/2015      End of Session - 07/20/15 1317    Visit Number 44   Number of Visits 88   Date for SLP Re-Evaluation 08/03/15   SLP Start Time 1150   SLP Stop Time  1230   SLP Time Calculation (min) 40 min      Past Medical History  Diagnosis Date  . Muscle tremor     saw neurology remotely elsewhere, was Rx inderal  . Crohn's ileocolitis ([place])   . Osteopenia     per DEXA 11/2008  . Hypertension 11/11    mild  . Hemorrhoids   . Hypertrophy of prostate     w/o UR obst & oth luts  . Peripheral neuropathy (HCC)     h/o neg w/u  . ED (erectile dysfunction)   . Hypertriglyceridemia   . COPD (chronic obstructive pulmonary disease) ([place])     recent dx--no acute problems  . Hiatal hernia 1956    found while in service  . Anxiety   . Atrial fibrillation with RVR ([place])   . Long term (current) use of anticoagulants 02/21/2011  . Serrated adenoma of colon 03/2010  . Renal cyst     bilateral  . Pulmonary fibrosis ([place]) 2015    Rx Esbriet ~ 10-2013 ([name]), Dr. [name]  . On home oxygen therapy     "2L; 20-24h for the past week" (01/26/2015)  . Emphysema lung ([place]) 2016    Dr. [name]  . B12 deficiency anemia   . Idiopathic pulmonary fibrosis ([place]) 2016    Dr. [name]  . Pneumonia ?2015  . CAP (community acquired pneumonia) 01/26/2015  . Osteoarthritis     "maybe in my hands, feet" (01/26/2015)  . Depression   . Squamous cell carcinoma of skin of scalp     tx'd ~ 47yr; "froze them off"  . Squamous cell carcinoma, face      "froze them off"  . Melanoma of scalp ([place]   . Stroke ([facility]     Past Surgical History  Procedure Laterality Date  . Colon  resection  1984    resection of distal ileum and cecum w/ appendectomy, 18-inch small intestine, for Crohn's disease  . Colon surgery    . Appendectomy  1984  . Cataract extraction w/ intraocular lens  implant, bilateral Bilateral 2007  . Cholecystectomy  02/17/2011    Procedure: LAPAROSCOPIC CHOLECYSTECTOMY WITH INTRAOPERATIVE CHOLANGIOGRAM;  Surgeon: BEdward Jolly MD;  Location: [facility];  Service: General;  Laterality: N/A;  . Mohs surgery Right ~ 2014    "side of my scalp"    There were no vitals filed for this visit.      Subjective Assessment - 07/20/15 1206    Patient is accompained by: Family member  JRomie Minus  Currently in Pain? No/denies               ADULT SLP TREATMENT - 07/20/15 1206    General Information   Behavior/Cognition Alert;Cooperative;Pleasant mood;Requires cueing   Treatment Provided   Treatment provided Dysphagia   Dysphagia Treatment   Temperature Spikes Noted No   Respiratory Status Nasal cannula   Oral Cavity - Dentition Dentures, top;Dentures, bottom   Treatment Methods Skilled  observation;Therapeutic exercise;Patient/caregiver education   Patient observed directly with PO's Yes   Type of PO's observed Thin liquids;Dysphagia 1 (puree)   Pharyngeal Phase Signs & Symptoms Wet vocal quality  on <5% of swallows; pt ID'd 80%   Other treatment/comments Placement 3a was used with NMES along with effortful swallows with POs and portions of pt's HEP. mA today at 11.0. Effortful swallow 85% initially faded to 80% - pt req'd min cues occasionally for effortful swallow. Third swallow today was stronger later into the session, pt exhibited tongue pumping on third swallow after 19 minutes. Caryl Ada was attempted but was challenging likely due to oral nonverbal apraxia. Pt achieved Mendelsohn on 1/9 attempts.    Assessment / Recommendations / Plan   Plan Continue with current plan of care   Dysphagia Recommendations   Diet recommendations NPO    Progression Toward Goals   Progression toward goals --  pt remains motivated for change          SLP Education - 07/20/15 1317    Education provided Yes   Education Details [employer]) Educated Patient;Spouse   Methods Explanation;Demonstration;Verbal cues   Comprehension Verbalized understanding;Verbal cues required;Need further instruction          SLP Short Term Goals - 06/27/15 1355    SLP SHORT TERM GOAL #1   Title pt will demo efforftul swallows 60% of the time in >100 opportunities over 3 sessions   Baseline three sesions 04-26-15   Status Achieved   SLP SHORT TERM GOAL #2   Title pt will throat clear/cough with hydrophonic voice with rare nonverbal cues   Status Achieved   SLP SHORT TERM GOAL #3   Title pt will complete HEP for dysphagia with modified independence (demo cues)   Status Achieved          SLP Long Term Goals - 07/18/15 1145    SLP LONG TERM GOAL #1   Title pt will demo effortful swallows 75% of the time in >120 opportunities over 3 sessions   Status Achieved   SLP LONG TERM GOAL #2   Title pt will complete dysphagia HEP with modified independence over 3 sessions   Baseline two sessions 05-16-15   Status Achieved   SLP LONG TERM GOAL #3   Title pt will clear voice/cough with hydrophonic voice independently 90% of the time   Status Achieved   SLP LONG TERM GOAL #4   Title pt/wife will indicate 3 s/s aspiration PNA   Status Achieved   SLP LONG TERM GOAL #5   Title pt to demo effortful swallow in 85% of swallows up to 25 minutes after NMES placement   Status Achieved   SLP LONG TERM GOAL #6   Title pt to demo effortful swallow in 90% of swallows in first 30 minutes after NMES placement   Time 4   Period Weeks   Status Not Met  Goal to continue for new reporting period (until 06-29-15)   SLP LONG TERM GOAL #7   Title with NMES placed, pt will exhibit wet voice no more than 10% of the time after initial swallow of POs over three  sessions   Status Achieved   SLP LONG TERM GOAL #8   Title pt will follow swallow precautions indicated on modfied barium swallow report 07-03-15, over 3 sessions   Baseline one session 07-09-15   Time 3   Period Weeks   Status Revised   SLP LONG TERM GOAL  #9  TITLE pt will demo effortful swallow 90% with rare min A in first 30 minutes after NMES placed over 3 sessions   Time 3   Period Weeks   Status On-going   SLP LONG TERM GOAL  #10   TITLE pt to perform HEP with rare min A (SHaker, MAsako, Mendelsohn)   Time 3   Period Weeks   Status On-going          Plan - 07/20/15 1318    Clinical Impression Statement  Pt remains without s/s aspiration PNA. Pt/wife doing 1/2 cup water and one 1/2 cup ice chips per day, wiht precautions from modified barium swallow last week. Pt cont to demo reduced swallow strength (see MBSS for details) demo'd today by tongue pumping with subsequent swallows and consistent food-tinged residue when pt "hocked".. He would benefit from cont skilled ST x3/week utilizing strengthening exercises as well as POs including but not limited to the use of NMES. Pt  more aware of wet voice today and consistently "hocked." SLP looking at progress next week to assess whether or not to cont with skilled ST.   Speech Therapy Frequency 3x / week   Duration 2 weeks   Treatment/Interventions Aspiration precaution training;Pharyngeal strengthening exercises;Diet toleration management by SLP;NMES;Cueing hierarchy;Functional tasks;Patient/family education;Compensatory strategies;SLP instruction and feedback;Language facilitation   Potential to Achieve Goals Good   Potential Considerations Severity of impairments   Consulted and Agree with Plan of Care Family member/caregiver      Patient will benefit from skilled therapeutic intervention in order to improve the following deficits and impairments:   Dysphagia  Aphasia following cerebral infarction    Problem List Patient  Active Problem List   Diagnosis Date Noted  . Cerebral infarction due to embolism of left middle cerebral artery ([place]) 04/18/2015  . Chronic anticoagulation 04/18/2015  . HLD (hyperlipidemia) 04/18/2015  . Persistent atrial fibrillation ([place]) 04/18/2015  . Gait disturbance, post-stroke 03/29/2015  . Atrial fibrillation with RVR ([place]) 03/13/2015  . Dysphagia   . Left middle cerebral artery stroke ([place]) 02/02/2015  . Dysphagia, pharyngoesophageal phase 02/02/2015  . Aphasia due to recent cerebrovascular accident 02/02/2015  . Bacterial pneumonia   . Stroke with cerebral ischemia ([place])   . Stroke ([place]) 01/31/2015  . CVA (cerebral infarction) 01/31/2015  . Aphasia   . Hypokalemia   . CAP (community acquired pneumonia) 01/26/2015  . Pulmonary fibrosis ([place]) 01/26/2015  . PCP NOTES >>>>>>>>>>>> 12/14/2014  . Vitamin D deficiency 11/04/2013  . Vasomotor rhinitis 09/22/2013  . Diarrhea 07/08/2013  . Crohn's disease ([place]) 07/08/2013  . Postinflammatory pulmonary fibrosis ([place]) 05/26/2013  . Anxiety state 05/20/2013  . Sprain of shoulder, left 06/01/2012  . Personal history of colonic polyps 04/12/2012  . Pulmonary nodules 03/02/2011  . Cough 03/02/2011  . Long term (current) use of anticoagulants 02/21/2011  . Atrial fibrillation with controlled ventricular response ([place]) 02/17/2011  . Annual physical exam 12/23/2010  . Dyspnea 09/03/2010  . HEMORRHOIDS-INTERNAL 03/08/2010  . [facility] INTESTINE 03/08/2010  . RECTAL BLEEDING 03/08/2010  . HEMATOCHEZIA 03/06/2010  . Essential hypertension 02/04/2010  . HYPERTROPHY PROSTATE W/O UR OBST & OTH LUTS 12/10/2009  . Osteopenia 12/07/2009  . PERIPHERAL NEUROPATHY 08/29/2009  . FECAL INCONTINENCE 01/29/2009  . OSTEOARTHRITIS, ANKLE, RIGHT 11/01/2008  . ERECTILE DYSFUNCTION 12/02/2007  . TREMOR 12/02/2007  . CARCINOMA, SKIN, SQUAMOUS CELL 09/16/2006  . B12 DEFICIENCY 08/27/2006  . HYPERTRIGLYCERIDEMIA 08/27/2006      SCHINKE,CARL ,[place], [place]  07/20/2015, 1:20 PM  [facility]  [facility] [address] [place], [place], [address] Phone: [phone]   Fax:  [phone]   Name: Brian [name] MRN: [id number] Date of Birth: April 05, 1928

## 2015-07-23 ENCOUNTER — Ambulatory Visit: Payer: Medicare Other | Attending: Neurology

## 2015-07-23 ENCOUNTER — Ambulatory Visit (INDEPENDENT_AMBULATORY_CARE_PROVIDER_SITE_OTHER): Payer: Medicare Other | Admitting: Neurology

## 2015-07-23 ENCOUNTER — Encounter: Payer: Self-pay | Admitting: Neurology

## 2015-07-23 VITALS — BP 132/69 | HR 73 | Ht 71.0 in | Wt 150.4 lb

## 2015-07-23 DIAGNOSIS — I63412 Cerebral infarction due to embolism of left middle cerebral artery: Secondary | ICD-10-CM

## 2015-07-23 DIAGNOSIS — I481 Persistent atrial fibrillation: Secondary | ICD-10-CM

## 2015-07-23 DIAGNOSIS — R1310 Dysphagia, unspecified: Secondary | ICD-10-CM | POA: Insufficient documentation

## 2015-07-23 DIAGNOSIS — Z7901 Long term (current) use of anticoagulants: Secondary | ICD-10-CM

## 2015-07-23 DIAGNOSIS — R4701 Aphasia: Secondary | ICD-10-CM

## 2015-07-23 DIAGNOSIS — I4819 Other persistent atrial fibrillation: Secondary | ICD-10-CM

## 2015-07-23 DIAGNOSIS — E785 Hyperlipidemia, unspecified: Secondary | ICD-10-CM

## 2015-07-23 DIAGNOSIS — I69320 Aphasia following cerebral infarction: Secondary | ICD-10-CM | POA: Diagnosis not present

## 2015-07-23 NOTE — Patient Instructions (Signed)
-   continue eliquis and pravastatin for stroke prevention - continue speech therapy for language - follow with speech therapist for dysphagia, will repeat barium study in 4 weeks.  - follow up with [name] pulmonary as scheduled - Follow up with your primary care physician for stroke risk factor modification. Recommend maintain blood pressure goal around 120-130/80, diabetes with hemoglobin A1c goal below 6.5% and lipids with LDL cholesterol goal below 70 mg/dL.  - check BP at home. - follow up in 6 months.

## 2015-07-23 NOTE — Progress Notes (Signed)
STROKE NEUROLOGY FOLLOW UP NOTE  NAME: Brian [name] DOB: 1928/04/13  REASON FOR VISIT: stroke follow up HISTORY FROM: wife and chart  Today we had the pleasure of seeing Brian [name] in follow-up at our Neurology Clinic. Pt was accompanied by wife.   History Summary Brian [name] is a 82 y.o. male with history of pulmonary fibrosis, atrial fibrillation on coumadin and crohn's disease presenting with right sided weakness and difficulty speaking to [facility] on 01/31/15. He did not receive IV t-PA due to elevated INR 1.9 on coumadin. MRI showed left MCA infarct. MRA and CTA showed posterior distal left MCA branch occlusion. TTE unremarkable. LDL 86 and A1C 5.6. His right-sided weakness resolved but continued to have receptive aphasia. His coumadin was switched to eliquis and started on low dose pravastatin on discharge.  04/18/15 follow up - the patient has been doing well from stroke standpoint. Still has receptive aphasia and currently works with speech therapy. The aphasia fluctuate at home more prominent with fatigue or tiredness. He still has swallow difficulty and still use TF via PEG. Follows with speech in march to see if PEG can be removed. He has follow up with pulmonary in [name] next week. Currently still on home O2. BP 111/67 today. He still has afib on eliquis and also on bystolic for rate control.  Interval History During the interval time, pt has no recurrent stroke like symptoms. Followed with speech closely, MBS on 07/03/15, and now on dysphagia 1 diet (puree) and thin liquids only during speech therapy visit. At home still NOP except ice chips. Doing language exercise at home, will need MBS in 4 weeks. Had epistaxis vs. Hemoptysis in 04/2015 with ED visit but stable CXR and no significant bleeding. Medication all continued. BP today 132/69  REVIEW OF SYSTEMS: Full 14 system review of systems performed and notable only for those listed below and in HPI above, all others are negative:    Constitutional:   Cardiovascular:  Ear/Nose/Throat:  Trouble swallowing Skin:  Eyes:   Respiratory:   Gastroitestinal:   Genitourinary:  Hematology/Lymphatic:   Endocrine:  Musculoskeletal:   Allergy/Immunology:   Neurological:  Speech difficulty, slight tremor Psychiatric:  Sleep:   The following represents the patient's updated allergies and side effects list: No Known Allergies  The neurologically relevant items on the patient's problem list were reviewed on today's visit.  Neurologic Examination  A problem focused neurological exam (12 or more points of the single system neurologic examination, vital signs counts as 1 point, cranial nerves count for 8 points) was performed.  Blood pressure 132/69, pulse 73, height 5\' 11"  (1.803 m), weight 150 lb 6.4 oz (68.221 kg).  General - cachetic, well developed, in no apparent distress.  Ophthalmologic - Fundi not visualized due to noncooperatoin.  Cardiovascular - irregularly irregular heart rate and rhythm.  Mental Status -  Awake alert, pleasant, able to follow some simple commands but not all of them, able to carry conversation intermittently. Speech output fluent with frequent word finding difficulty. Not able to repeat yet, but name 3/6. Reading with paraphasic errors.  Cranial Nerves II - XII - II - blinking to visual threat bilaterally. III, IV, VI - Extraocular movements intact. V - Facial sensation intact bilaterally. VII - Facial movement intact bilaterally. VIII - Vestibular intact bilaterally. X - Palate elevates symmetrically. XI - Chin turning & shoulder shrug intact bilaterally. XII - Tongue protrusion intact.  Motor Strength - The patient's strength was normal in  all extremities and pronator drift was absent.  Bulk was normal and fasciculations were absent.   Motor Tone - Muscle tone was assessed at the neck and appendages and was normal.  Reflexes - The patient's reflexes were 1+ in all extremities and he  had no pathological reflexes.  Sensory - Light touch, temperature/pinprick were assessed and were normal.    Coordination - The patient had normal movements in the hands and feet with no ataxia or dysmetria.    Gait and Station - able to walk without cane, slow cautious gait but stable.  Data reviewed: I personally reviewed the images and agree with the radiology interpretations.  Ct Head Wo Contrast 01/31/2015 Mild chronic microvascular ischemia. No acute abnormality.   Ct Angio Head & Neck W/cm &/or Wo/cm 01/31/2015 1. No large vessel occlusion. 2. Left M2 MCA branch vessel occlusion. 3. No cervical carotid or vertebral artery stenosis. 4. 2 mm left posterior communicating artery infundibulum versus aneurysm. 5. Extensive right upper lobe lung consolidation with areas of cavitation, incompletely visualized. Mild mediastinal lymphadenopathy.   MRI & MRA Brain Wo Contrast 01/31/2015 1. Acute nonhemorrhagic posterior left MCA territory infarct is confirmed. 2. Occlusion of distal left posterior MCA branch vessel. There is opacification of an additional left MCA branch vessels since the earlier CTA study. 3. Extensive white matter disease is present in addition to the acute infarct.   Dg Chest Port 1 View 01/31/2015 Slight worsening of RIGHT upper lobe pneumonia superimposed on emphysematous change.   2D Echocardiogram  - Left ventricle: The cavity size was normal. Wall thickness wasincreased in a pattern of mild LVH. Systolic function was normal.The estimated ejection fraction was in the range of 55% to 60%.Regional wall motion abnormalities cannot be excluded. - Mitral valve: Calcified annulus. There was mild regurgitation. - Right atrium: The atrium was mildly dilated. - Pulmonary arteries: PA peak pressure: 34 mm Hg (S).  Component     Latest Ref Rng 01/31/2015 02/01/2015  Cholesterol     0 - 200 mg/dL  103  Triglycerides     <150 mg/dL  72  HDL Cholesterol     >40 mg/dL   33 (L)  Total CHOL/HDL Ratio       3.1  VLDL     0 - 40 mg/dL  14  LDL (calc)     0 - 99 mg/dL  56  Hemoglobin A1C     4.8 - 5.6 % 5.6   Mean Plasma Glucose      114     Assessment: As you may recall, he is a 82 y.o. Caucasian male with PMH of pulmonary fibrosis, atrial fibrillation on coumadin and crohn's disease admitted on 01/31/15 for left MCA infarct. MRA and CTA showed posterior distal left MCA branch occlusion. TTE unremarkable. LDL 86 and A1C 5.6. His INR on admission 1.9. His right-sided weakness resolved but continued to have receptive aphasia. His coumadin was switched to eliquis and started on low dose pravastatin on discharge. During the interval time, he is doing well from stroke standpoint, but still has receptive aphasia and dysphagia. Working with speech therapy closely and had improvement. No significant bleeding side effect with eliquis.  Plan:  - continue eliquis and pravastatin for stroke prevention - continue speech therapy for language - follow with speech therapist for dysphagia, will repeat barium study in 4 weeks.  - follow up with [name] as scheduled - Follow up with your primary care physician for stroke risk factor modification. Recommend  maintain blood pressure goal around 120-130/80, diabetes with hemoglobin A1c goal below 6.5% and lipids with LDL cholesterol goal below 70 mg/dL.  - check BP at home. - follow up in 6 months.  I spent more than 25 minutes of face to face time with the patient. Greater than 50% of time was spent in counseling and coordination of care.    Orders Placed This Encounter  Procedures  . DG OP Swallowing Func-Medicare/Speech Path    Standing Status: Future     Number of Occurrences:      Standing Expiration Date: 09/21/2016    Scheduling Instructions:     Prefer with [facility]    Order Specific Question:  Reason for Exam (SYMPTOM  OR DIAGNOSIS REQUIRED)    Answer:  dysphagia    Order Specific Question:  Preferred imaging  location?    Answer:  [facility]    No orders of the defined types were placed in this encounter.    Patient Instructions  - continue eliquis and pravastatin for stroke prevention - continue speech therapy for language - follow with speech therapist for dysphagia, will repeat barium study in 4 weeks.  - follow up with [name] pulmonary as scheduled - Follow up with your primary care physician for stroke risk factor modification. Recommend maintain blood pressure goal around 120-130/80, diabetes with hemoglobin A1c goal below 6.5% and lipids with LDL cholesterol goal below 70 mg/dL.  - check BP at home. - follow up in 6 months.     Rosalin Hawking, MD PhD [facility] [address], [place], [place] [address] [phone]

## 2015-07-23 NOTE — Therapy (Signed)
[facility] [address] [place], [place], [address] Phone: [phone]   Fax:  [phone]  Speech Language Pathology Treatment  Patient Details  Name: Brian [name] MRN: [id number] Date of Birth: 1928-10-13 Referring Provider: Rosalin Hawking M.D.  Encounter Date: 07/23/2015      End of Session - 07/23/15 1249    Visit Number 45   Number of Visits 57   Date for SLP Re-Evaluation 08/03/15   SLP Start Time 1150   SLP Stop Time  1230   SLP Time Calculation (min) 40 min   Activity Tolerance Patient tolerated treatment well      Past Medical History  Diagnosis Date  . Muscle tremor     saw neurology remotely elsewhere, was Rx inderal  . Crohn's ileocolitis ([place])   . Osteopenia     per DEXA 11/2008  . Hypertension 11/11    mild  . Hemorrhoids   . Hypertrophy of prostate     w/o UR obst & oth luts  . Peripheral neuropathy (HCC)     h/o neg w/u  . ED (erectile dysfunction)   . Hypertriglyceridemia   . COPD (chronic obstructive pulmonary disease) ([place])     recent dx--no acute problems  . Hiatal hernia 1956    found while in service  . Anxiety   . Atrial fibrillation with RVR ([place])   . Long term (current) use of anticoagulants 02/21/2011  . Serrated adenoma of colon 03/2010  . Renal cyst     bilateral  . Pulmonary fibrosis ([place]) 2015    Rx Esbriet ~ 10-2013 ([name]), Dr. [name]  . On home oxygen therapy     "2L; 20-24h for the past week" (01/26/2015)  . Emphysema lung ([place]) 2016    Dr. [name]  . B12 deficiency anemia   . Idiopathic pulmonary fibrosis ([place]) 2016    Dr. [name]  . Pneumonia ?2015  . CAP (community acquired pneumonia) 01/26/2015  . Osteoarthritis     "maybe in my hands, feet" (01/26/2015)  . Depression   . Squamous cell carcinoma of skin of scalp     tx'd ~ 70yr; "froze them off"  . Squamous cell carcinoma, face      "froze them off"  . Melanoma of scalp ([place]   . Stroke ([facility]     Past  Surgical History  Procedure Laterality Date  . Colon resection  1984    resection of distal ileum and cecum w/ appendectomy, 18-inch small intestine, for Crohn's disease  . Colon surgery    . Appendectomy  1984  . Cataract extraction w/ intraocular lens  implant, bilateral Bilateral 2007  . Cholecystectomy  02/17/2011    Procedure: LAPAROSCOPIC CHOLECYSTECTOMY WITH INTRAOPERATIVE CHOLANGIOGRAM;  Surgeon: BEdward Jolly MD;  Location: [facility];  Service: General;  Laterality: N/A;  . Mohs surgery Right ~ 2014    "side of my scalp"    There were no vitals filed for this visit.      Subjective Assessment - 07/23/15 1201    Subjective Pt indicated understanding of how to perform [facility]ly.                ADULT SLP TREATMENT - 07/23/15 1208    General Information   Behavior/Cognition Alert;Cooperative;Pleasant mood;Requires cueing   Treatment Provided   Treatment provided Dysphagia   Dysphagia Treatment   Temperature Spikes Noted No   Respiratory Status Nasal cannula   Oral Cavity - Dentition Dentures, top;Dentures, bottom  Treatment Methods Skilled observation;Therapeutic exercise;Patient/caregiver education   Patient observed directly with PO's Yes   Type of PO's observed Thin liquids;Dysphagia 1 (puree)  runny dys I   Pharyngeal Phase Signs & Symptoms Wet vocal quality;Immediate throat clear  minimal (<5%) wetness-moreso last 10 minutes NMES   Other treatment/comments NMES placement 3b and mA 10.5 to strengthen muscles used fro swallowing and to reduce risk of aspiration. Immediate throat clear with first or second swallows. Clear after the next swallows, routinely. When "hocking" routinely, pt consistently brought up secretions at least tinged with PO, at most obvious PO.  Pt's tongue pumping on second and third swallow began after 9 minutes of placement of NMES, intermittently but more frequently after 15 minutes. Pt still without overt s/s aspiration PNA.  Successful completion of Mendelsohn x2/3 chances at beginning of session but due to likely oral nonverbal apraxia, success diminished as session continued. Wife to cont to work with pt at home, with some pointers from SLP (written, visual cues in addition to verbal cues she already provides).   Assessment / Recommendations / Plan   Plan Continue with current plan of care   Dysphagia Recommendations   Diet recommendations NPO   Progression Toward Goals   Progression toward goals Not progressing toward goals (comment)  decide end of this week whether to cont or not with ST          SLP Education - 07/23/15 1249    Education provided Yes   Education Details [employer]) Educated Spouse;Patient   Methods Explanation;Demonstration;Verbal cues   Comprehension Verbalized understanding;Returned demonstration;Verbal cues required;Need further instruction          SLP Short Term Goals - 06/27/15 1355    SLP SHORT TERM GOAL #1   Title pt will demo efforftul swallows 60% of the time in >100 opportunities over 3 sessions   Baseline three sesions 04-26-15   Status Achieved   SLP SHORT TERM GOAL #2   Title pt will throat clear/cough with hydrophonic voice with rare nonverbal cues   Status Achieved   SLP SHORT TERM GOAL #3   Title pt will complete HEP for dysphagia with modified independence (demo cues)   Status Achieved          SLP Long Term Goals - 07/23/15 1251    SLP LONG TERM GOAL #1   Title pt will demo effortful swallows 75% of the time in >120 opportunities over 3 sessions   Status Achieved   SLP LONG TERM GOAL #2   Title pt will complete dysphagia HEP with modified independence over 3 sessions   Baseline two sessions 05-16-15   Status Achieved   SLP LONG TERM GOAL #3   Title pt will clear voice/cough with hydrophonic voice independently 90% of the time   Status Achieved   SLP LONG TERM GOAL #4   Title pt/wife will indicate 3 s/s aspiration PNA   Status Achieved    SLP LONG TERM GOAL #5   Title pt to demo effortful swallow in 85% of swallows up to 25 minutes after NMES placement   Status Achieved   SLP LONG TERM GOAL #6   Title pt to demo effortful swallow in 90% of swallows in first 30 minutes after NMES placement   Time 4   Period Weeks   Status Not Met  Goal to continue for new reporting period (until 06-29-15)   SLP LONG TERM GOAL #7   Title with NMES placed, pt will exhibit  wet voice no more than 10% of the time after initial swallow of POs over three sessions   Status Achieved   SLP LONG TERM GOAL #8   Title pt will follow swallow precautions indicated on modfied barium swallow report 07-03-15, over 3 sessions   Baseline two sessions 07-23-15   Time 2   Period Weeks   Status Revised   SLP LONG TERM GOAL  #9   TITLE pt will demo effortful swallow 90% with rare min A in first 30 minutes after NMES placed over 3 sessions   Time 2   Period Weeks   Status On-going   SLP LONG TERM GOAL  #10   TITLE pt to perform HEP with rare min A (SHaker, MAsako, Mendelsohn)   Time 2   Period Weeks   Status On-going          Plan - 07/23/15 1249    Clinical Impression Statement  Pt remains without s/s aspiration PNA. Pt/wife doing 1/2 cup water and one 1/2 cup ice chips per day, with precautions from latest modified barium swallow. Pt cont'd to demo reduced swallow strength demo'd today by tongue pumping with subsequent swallows and consistent food-tinged residue when pt "hocked". Skilled ST x3/week utilizing strengthening exercises as well as POs including but not limited to the use of NMES will continue at least to the end of this week when progress will be assesed whether or not to cont. Pt appeared aware of wet voice today, and again, consistently "hocked."    Speech Therapy Frequency 3x / week   Duration 1 week   Treatment/Interventions Aspiration precaution training;Pharyngeal strengthening exercises;Diet toleration management by SLP;NMES;Cueing  hierarchy;Functional tasks;Patient/family education;Compensatory strategies;SLP instruction and feedback;Language facilitation   Potential to Achieve Goals Good   Potential Considerations Severity of impairments   Consulted and Agree with Plan of Care Family member/caregiver      Patient will benefit from skilled therapeutic intervention in order to improve the following deficits and impairments:   Dysphagia    Problem List Patient Active Problem List   Diagnosis Date Noted  . Cerebral infarction due to embolism of left middle cerebral artery ([place]) 04/18/2015  . Chronic anticoagulation 04/18/2015  . HLD (hyperlipidemia) 04/18/2015  . Persistent atrial fibrillation ([place]) 04/18/2015  . Gait disturbance, post-stroke 03/29/2015  . Atrial fibrillation with RVR ([place]) 03/13/2015  . Dysphagia   . Left middle cerebral artery stroke ([place]) 02/02/2015  . Dysphagia, pharyngoesophageal phase 02/02/2015  . Aphasia due to recent cerebrovascular accident 02/02/2015  . Bacterial pneumonia   . Stroke with cerebral ischemia ([place])   . Stroke ([place]) 01/31/2015  . CVA (cerebral infarction) 01/31/2015  . Aphasia   . Hypokalemia   . CAP (community acquired pneumonia) 01/26/2015  . Pulmonary fibrosis ([place]) 01/26/2015  . PCP NOTES >>>>>>>>>>>> 12/14/2014  . Vitamin D deficiency 11/04/2013  . Vasomotor rhinitis 09/22/2013  . Diarrhea 07/08/2013  . Crohn's disease ([place]) 07/08/2013  . Postinflammatory pulmonary fibrosis ([place]) 05/26/2013  . Anxiety state 05/20/2013  . Sprain of shoulder, left 06/01/2012  . Personal history of colonic polyps 04/12/2012  . Pulmonary nodules 03/02/2011  . Cough 03/02/2011  . Long term (current) use of anticoagulants 02/21/2011  . Atrial fibrillation with controlled ventricular response ([place]) 02/17/2011  . Annual physical exam 12/23/2010  . Dyspnea 09/03/2010  . HEMORRHOIDS-INTERNAL 03/08/2010  . [place] INTESTINE 03/08/2010  . RECTAL BLEEDING  03/08/2010  . HEMATOCHEZIA 03/06/2010  . Essential hypertension 02/04/2010  . HYPERTROPHY PROSTATE W/O UR  OBST & OTH LUTS 12/10/2009  . Osteopenia 12/07/2009  . PERIPHERAL NEUROPATHY 08/29/2009  . FECAL INCONTINENCE 01/29/2009  . OSTEOARTHRITIS, ANKLE, RIGHT 11/01/2008  . ERECTILE DYSFUNCTION 12/02/2007  . TREMOR 12/02/2007  . CARCINOMA, SKIN, SQUAMOUS CELL 09/16/2006  . B12 DEFICIENCY 08/27/2006  . HYPERTRIGLYCERIDEMIA 08/27/2006    [place], [place]   07/23/2015, 12:52 PM  [facility] [address] [place], [place], [address] Phone: [phone]   Fax:  [phone]   Name: Brian [name] MRN: [id number] Date of Birth: 05-22-1928

## 2015-07-25 ENCOUNTER — Encounter: Payer: Self-pay | Admitting: Internal Medicine

## 2015-07-25 ENCOUNTER — Ambulatory Visit (INDEPENDENT_AMBULATORY_CARE_PROVIDER_SITE_OTHER): Payer: Medicare Other | Admitting: Internal Medicine

## 2015-07-25 VITALS — BP 120/50 | HR 68 | Temp 97.4°F | Ht 71.0 in | Wt 149.2 lb

## 2015-07-25 DIAGNOSIS — E559 Vitamin D deficiency, unspecified: Secondary | ICD-10-CM | POA: Diagnosis not present

## 2015-07-25 DIAGNOSIS — R1314 Dysphagia, pharyngoesophageal phase: Secondary | ICD-10-CM | POA: Diagnosis not present

## 2015-07-25 DIAGNOSIS — I63412 Cerebral infarction due to embolism of left middle cerebral artery: Secondary | ICD-10-CM

## 2015-07-25 DIAGNOSIS — E871 Hypo-osmolality and hyponatremia: Secondary | ICD-10-CM

## 2015-07-25 DIAGNOSIS — F411 Generalized anxiety disorder: Secondary | ICD-10-CM | POA: Diagnosis not present

## 2015-07-25 DIAGNOSIS — K625 Hemorrhage of anus and rectum: Secondary | ICD-10-CM

## 2015-07-25 DIAGNOSIS — Z09 Encounter for follow-up examination after completed treatment for conditions other than malignant neoplasm: Secondary | ICD-10-CM

## 2015-07-25 DIAGNOSIS — K640 First degree hemorrhoids: Secondary | ICD-10-CM

## 2015-07-25 LAB — BASIC METABOLIC PANEL
BUN: 22 mg/dL (ref 6–23)
CALCIUM: 9.8 mg/dL (ref 8.4–10.5)
CO2: 31 mEq/L (ref 19–32)
Chloride: 94 mEq/L — ABNORMAL LOW (ref 96–112)
Creatinine, Ser: 0.71 mg/dL (ref 0.40–1.50)
GFR: 112.04 mL/min (ref >60.00)
Glucose, Bld: 91 mg/dL (ref 70–99)
Potassium: 4.3 mEq/L (ref 3.5–5.1)
SODIUM: 132 meq/L — AB (ref 135–145)

## 2015-07-25 LAB — VITAMIN D 25 HYDROXY (VIT D DEFICIENCY, FRACTURES): VITD: 24.61 ng/mL — AB (ref 30.00–100.00)

## 2015-07-25 MED ORDER — HYDROCORTISONE 2.5 % EX CREA: TOPICAL_CREAM | Freq: Two times a day (BID) | CUTANEOUS | Status: DC

## 2015-07-25 NOTE — Progress Notes (Addendum)
Subjective:    Patient ID: Brian [name], male    DOB: 1929/01/04, 82 y.o.   MRN: [id number]  DOS:  07/25/2015 Type of visit - description : Routine checkup here with his wife Interval history: In general feeling well, very optimistic and enthusiastic about rehabilitation from CVA. CVA: Note from neurology 07/23/2015 reviewed >> stable, needs to continue working with speech therapist. Continue with feedings through the panda, having some issues and questions. Continue with oxygen supplements. Was seen by pulmonary and felt to be doing very well. Having problems with his hemorrhoids, sees few drops of blood when he wipes, some pain with BMs only  Labs 07-06-15: AST normal, ALT normal. Alkaline phosphatase 147 slightly elevated. Albumin 3.2 slightly low  Review of Systems Difficulty breathing actually better. No edema Anxiety depression well-controlled  Past Medical History  Diagnosis Date  . Muscle tremor     saw neurology remotely elsewhere, was Rx inderal  . Crohn's ileocolitis ([place])   . Osteopenia     per DEXA 11/2008  . Hypertension 11/11    mild  . Hemorrhoids   . Hypertrophy of prostate     w/o UR obst & oth luts  . Peripheral neuropathy (HCC)     h/o neg w/u  . ED (erectile dysfunction)   . Hypertriglyceridemia   . COPD (chronic obstructive pulmonary disease) ([place])     recent dx--no acute problems  . Hiatal hernia 1956    found while in service  . Anxiety   . Atrial fibrillation with RVR ([place])   . Long term (current) use of anticoagulants 02/21/2011  . Serrated adenoma of colon 03/2010  . Renal cyst     bilateral  . Pulmonary fibrosis ([place]) 2015    Rx Esbriet ~ 10-2013 ([name]), Dr. [name]  . On home oxygen therapy     "2L; 20-24h for the past week" (01/26/2015)  . Emphysema lung ([place]) 2016    Dr. [name]  . B12 deficiency anemia   . Idiopathic pulmonary fibrosis ([place]) 2016    Dr. [name]  . Pneumonia ?2015  . CAP (community acquired pneumonia) 01/26/2015  .  Osteoarthritis     "maybe in my hands, feet" (01/26/2015)  . Depression   . Squamous cell carcinoma of skin of scalp     tx'd ~ 58yrs; "froze them off"  . Squamous cell carcinoma, face      "froze them off"  . Melanoma of scalp ([place])   . Stroke [facility])     Past Surgical History  Procedure Laterality Date  . Colon resection  1984    resection of distal ileum and cecum w/ appendectomy, 18-inch small intestine, for Crohn's disease  . Colon surgery    . Appendectomy  1984  . Cataract extraction w/ intraocular lens  implant, bilateral Bilateral 2007  . Cholecystectomy  02/17/2011    Procedure: LAPAROSCOPIC CHOLECYSTECTOMY WITH INTRAOPERATIVE CHOLANGIOGRAM;  Surgeon: Edward Jolly, MD;  Location: [facility];  Service: General;  Laterality: N/A;  . Mohs surgery Right ~ 2014    "side of my scalp"    Social History   Social History  . Marital Status: Married    Spouse Name: Dalene Seltzer  . Number of Children: 4  . Years of Education: N/A   Occupational History  . Retired   . RETIRED    Social History Main Topics  . Smoking status: Former Smoker -- 1.00 packs/day for 40 years    Types: Cigarettes, Pipe    Quit  date: 03/24/1990  . Smokeless tobacco: Never Used  . Alcohol Use: No     Comment:    . Drug Use: No  . Sexual Activity: No   Other Topics Concern  . Not on file   Social History Narrative   Married, lives w/ wife         Medication List       This list is accurate as of: 07/25/15 11:59 PM.  Always use your most recent med list.               antiseptic oral rinse 0.05 % Liqd solution  Commonly known as:  CPC / CETYLPYRIDINIUM CHLORIDE 0.05%  7 mLs by Mouth Rinse route 2 times daily at 12 noon and 4 pm.     apixaban 5 MG Tabs tablet  Commonly known as:  ELIQUIS  Place 1 tablet (5 mg total) into feeding tube 2 (two) times daily.     busPIRone 15 MG tablet  Commonly known as:  BUSPAR  Place 1 tablet (15 mg total) into feeding tube 2 (two) times daily.      clonazePAM 0.5 MG tablet  Commonly known as:  KLONOPIN  Place 0.5 tablets (0.25 mg total) into feeding tube 3 (three) times daily as needed for anxiety.     feeding supplement (JEVITY 1.2 CAL) Liqd  Place 340 mLs into feeding tube 5 (five) times daily.     fluticasone 50 MCG/ACT nasal spray  Commonly known as:  FLONASE  Place into the nose. Place 2 sprays into both nostrils continuously as needed for Rhinitis.     free water Soln  Place 150 mLs into feeding tube 3 (three) times daily. Use filtered water     gemfibrozil 600 MG tablet  Commonly known as:  LOPID  Place 1 tablet (600 mg total) into feeding tube 2 (two) times daily.     hydrocortisone 2.5 % cream  Apply topically 2 (two) times daily.     loperamide 1 MG/5ML solution  Commonly known as:  IMODIUM  Place 10 mLs (2 mg total) into feeding tube as needed for diarrhea or loose stools.     NASCOBAL 500 MCG/0.1ML Soln  Generic drug:  Cyanocobalamin  As directed     nebivolol 5 MG tablet  Commonly known as:  BYSTOLIC  Take 1 tablet (5 mg total) by mouth daily.     OXYGEN  Inhale into the lungs continuous. 2 L     PENTASA PO  Give 1 capsule by tube 2 (two) times daily.     Pirfenidone 267 MG Caps  Take 3 capsules by mouth 3 (three) times daily. Put in feeding tube     pravastatin 10 MG tablet  Commonly known as:  PRAVACHOL  Place 1 tablet (10 mg total) into feeding tube daily.           Objective:   Physical Exam BP 120/50 mmHg  Pulse 68  Temp(Src) 97.4 F (36.3 C) (Oral)  Ht 5\' 11"  (1.803 m)  Wt 149 lb 3.2 oz (67.677 kg)  BMI 20.82 kg/m2  SpO2 91% General:   Well developed, well nourished . NAD.  HEENT:  Normocephalic . Face symmetric, atraumatic Lungs:  decreased breath sounds but clear Normal respiratory effort, no intercostal retractions, no accessory muscle use. Heart: irreg,  no murmur.  No pretibial edema bilaterally  Skin: Not pale. Not jaundice Psych--  Cooperative with normal attention  span and concentration.  Behavior appropriate. No anxious or depressed  appearing.      Assessment & Plan:   Assessment > HTN Hypertriglyceridemia Anxiety, on chronic Buspar , sx controlled  Depression 01-2015 after lost a brother  Atrial fibrillation anticoagulated CVA 01-2015: Dysphagia, aphasia, R weakness, has a PANDA   Pulmonary: --Pulmonary fibrosis, home oxygen (2 lt, 4 lt w/ exertion     --pneumonia 01-2015:  --Emphysema Tremors Crohn's colitis DJD Osteopenia DEXA 2010, DEXA 03-2014 T score -2.2, on calcium and vitamin D BPH Peripheral  neuropathy previous workup negative Skin cancer SCC B12 deficiency -- nascobal  H/o palpable abdominal aorta --- CT of the abdomen 06/2013 no AAA  Plan: Anxiety -- currently well controlled Atrial fibrillation: Continue anticoagulation CVA: Continue with dysphagia, gradually improving. Working hard with speech therapy Dysphagia: On a Panda tube, last sodium decrease, we'll recheck a BMP. Has some ?s regards the feeding tubes, HH no longer visiting the patient, will  contact speech pathology for suggestions. (Addendum he recommended a dietitian referral, will do) Hemorrhoids: Symptoms as above, recommend hydrocortisone as needed and Nupercainal OTC History of vitamin D deficiency, check a vitamin D RTC 3-4 months

## 2015-07-25 NOTE — Progress Notes (Signed)
Pre visit review using our clinic review tool, if applicable. No additional management support is needed unless otherwise documented below in the visit note. 

## 2015-07-25 NOTE — Patient Instructions (Addendum)
GO TO THE LAB :      Get the blood work     GO TO THE FRONT DESK Schedule your next appointment for a  routine checkup in 3-4 months, no fasting    Hemorrhoids: Use OTC Nupercainal and hydrocortisone 2.5% as needed

## 2015-07-26 ENCOUNTER — Other Ambulatory Visit: Payer: Self-pay | Admitting: Internal Medicine

## 2015-07-26 ENCOUNTER — Ambulatory Visit: Payer: Medicare Other

## 2015-07-26 DIAGNOSIS — I69320 Aphasia following cerebral infarction: Secondary | ICD-10-CM

## 2015-07-26 DIAGNOSIS — R1310 Dysphagia, unspecified: Secondary | ICD-10-CM

## 2015-07-26 MED ORDER — ERGOCALCIFEROL 8000 UNIT/ML PO SOLN: 8000.0000 [IU] | Freq: Every day | ORAL | Status: DC

## 2015-07-26 NOTE — Telephone Encounter (Signed)
Advise on this refill as patients wife stated she meant to request at his appt. But forgot.  States the patient is in need of this refill

## 2015-07-26 NOTE — Therapy (Signed)
[facility] [address] [place], [place], [address] Phone: [phone]   Fax:  [phone]  Speech Language Pathology Treatment  Patient Details  Name: Brian [name] MRN: [id number] Date of Birth: 1929-03-03 Referring Provider: Rosalin Hawking M.D.  Encounter Date: 07/26/2015      End of Session - 07/26/15 1700    Visit Number 46   Number of Visits 64   Date for SLP Re-Evaluation 08/03/15   SLP Start Time 7681   SLP Stop Time  1572   SLP Time Calculation (min) 40 min   Activity Tolerance Patient tolerated treatment well      Past Medical History  Diagnosis Date  . Muscle tremor     saw neurology remotely elsewhere, was Rx inderal  . Crohn's ileocolitis ([place])   . Osteopenia     per DEXA 11/2008  . Hypertension 11/11    mild  . Hemorrhoids   . Hypertrophy of prostate     w/o UR obst & oth luts  . Peripheral neuropathy (HCC)     h/o neg w/u  . ED (erectile dysfunction)   . Hypertriglyceridemia   . COPD (chronic obstructive pulmonary disease) ([place])     recent dx--no acute problems  . Hiatal hernia 1956    found while in service  . Anxiety   . Atrial fibrillation with RVR ([place])   . Long term (current) use of anticoagulants 02/21/2011  . Serrated adenoma of colon 03/2010  . Renal cyst     bilateral  . Pulmonary fibrosis ([place]) 2015    Rx Esbriet ~ 10-2013 ([name]), Dr. [name]  . On home oxygen therapy     "2L; 20-24h for the past week" (01/26/2015)  . Emphysema lung ([place]) 2016    Dr. [name]  . B12 deficiency anemia   . Idiopathic pulmonary fibrosis ([place]) 2016    Dr. [name]  . Pneumonia ?2015  . CAP (community acquired pneumonia) 01/26/2015  . Osteoarthritis     "maybe in my hands, feet" (01/26/2015)  . Depression   . Squamous cell carcinoma of skin of scalp     tx'd ~ 20yr; "froze them off"  . Squamous cell carcinoma, face      "froze them off"  . Melanoma of scalp ([facility]   . Stroke ([facility]     Past  Surgical History  Procedure Laterality Date  . Colon resection  1984    resection of distal ileum and cecum w/ appendectomy, 18-inch small intestine, for Crohn's disease  . Colon surgery    . Appendectomy  1984  . Cataract extraction w/ intraocular lens  implant, bilateral Bilateral 2007  . Cholecystectomy  02/17/2011    Procedure: LAPAROSCOPIC CHOLECYSTECTOMY WITH INTRAOPERATIVE CHOLANGIOGRAM;  Surgeon: BEdward Jolly MD;  Location: [facility];  Service: General;  Laterality: N/A;  . Mohs surgery Right ~ 2014    "side of my scalp"    There were no vitals filed for this visit.      Subjective Assessment - 07/26/15 1654    Subjective Wife states pt completed Mendelsohn twice in approx 20 attempts last night.   Patient is accompained by: Family member  wife   Currently in Pain? No/denies               ADULT SLP TREATMENT - 07/26/15 1655    General Information   Behavior/Cognition Alert;Cooperative;Pleasant mood;Requires cueing   Treatment Provided   Treatment provided Dysphagia   Dysphagia Treatment   Temperature  Spikes Noted No   Respiratory Status Nasal cannula   Oral Cavity - Dentition Dentures, top;Dentures, bottom   Treatment Methods Skilled observation;Therapeutic exercise;Patient/caregiver education   Patient observed directly with PO's Yes   Type of PO's observed Thin liquids;Dysphagia 1 (puree)  runny dys I   Pharyngeal Phase Signs & Symptoms Immediate throat clear;Wet vocal quality  minimal wet voice   Other treatment/comments NMES placement 3a today with mA 10.5. SLP used effortful swallow for pt along with runny dys I items and H2O with multiple swallows. Pt's performance much like previous two sessions, effortful swallow was 80% with cues. Tongue pumping with second and third swallows begain approx 10 minutes into session following NMES placement. SLP reiterated to pt's wife to cont to try Mendelsohn at home (wife is sure of how to accomplish, due to  performing correctly herself), cont all HEP.   Assessment / Recommendations / Plan   Plan Continue with current plan of care  likely d/c tomorrow    Dysphagia Recommendations   Diet recommendations NPO  ice chips, water not > 30 minutes after oral care   Progression Toward Goals   Progression toward goals Not progressing toward goals (comment)            SLP Short Term Goals - 06/27/15 1355    SLP SHORT TERM GOAL #1   Title pt will demo efforftul swallows 60% of the time in >100 opportunities over 3 sessions   Baseline three sesions 04-26-15   Status Achieved   SLP SHORT TERM GOAL #2   Title pt will throat clear/cough with hydrophonic voice with rare nonverbal cues   Status Achieved   SLP SHORT TERM GOAL #3   Title pt will complete HEP for dysphagia with modified independence (demo cues)   Status Achieved          SLP Long Term Goals - 07/26/15 1702    SLP LONG TERM GOAL #1   Title pt will demo effortful swallows 75% of the time in >120 opportunities over 3 sessions   Status Achieved   SLP LONG TERM GOAL #2   Title pt will complete dysphagia HEP with modified independence over 3 sessions   Baseline two sessions 05-16-15   Status Achieved   SLP LONG TERM GOAL #3   Title pt will clear voice/cough with hydrophonic voice independently 90% of the time   Status Achieved   SLP LONG TERM GOAL #4   Title pt/wife will indicate 3 s/s aspiration PNA   Status Achieved   SLP LONG TERM GOAL #5   Title pt to demo effortful swallow in 85% of swallows up to 25 minutes after NMES placement   Status Achieved   SLP LONG TERM GOAL #6   Title pt to demo effortful swallow in 90% of swallows in first 30 minutes after NMES placement   Time 4   Period Weeks   Status Not Met  Goal to continue for new reporting period (until 06-29-15)   SLP LONG TERM GOAL #7   Title with NMES placed, pt will exhibit wet voice no more than 10% of the time after initial swallow of POs over three sessions    Status Achieved   SLP LONG TERM GOAL #8   Title pt will follow swallow precautions indicated on modfied barium swallow report 07-03-15, over 3 sessions   Baseline two sessions 07-23-15   Time 2   Period Weeks   Status Revised   SLP LONG TERM GOAL  #  9   TITLE pt will demo effortful swallow 90% with rare min A in first 30 minutes after NMES placed over 3 sessions   Time 2   Period Weeks   Status On-going   SLP LONG TERM GOAL  #10   TITLE pt to perform HEP with rare min A (SHaker, MAsako, Mendelsohn)   Time 2   Period Weeks   Status On-going          Plan - 07/26/15 1701    Clinical Impression Statement  Pt remains without s/s aspiration PNA. Pt/wife doing 1/2 cup water and one 1/2 cup ice chips per day, with precautions from latest modified barium swallow. Pt cont'd to demo reduced swallow strength demo'd today by tongue pumping with subsequent swallows. Pt performed cough/reswallow and "hock" spontaneously - with "hock", PO-tinged secretions were observed consistently. Skilled ST x3/week utilizing strengthening exercises as well as POs including but not limited to the use of NMES will continue at least to the end of this week when progress will be assesed whether or not to cont. Pt appeared aware of wet voice today, and again, consistently "hocked."    Speech Therapy Frequency 3x / week   Duration 1 week   Treatment/Interventions Aspiration precaution training;Pharyngeal strengthening exercises;Diet toleration management by SLP;NMES;Cueing hierarchy;Functional tasks;Patient/family education;Compensatory strategies;SLP instruction and feedback;Language facilitation   Potential to Achieve Goals Good   Potential Considerations Severity of impairments   Consulted and Agree with Plan of Care Family member/caregiver      Patient will benefit from skilled therapeutic intervention in order to improve the following deficits and impairments:   Dysphagia  Aphasia following cerebral  infarction    Problem List Patient Active Problem List   Diagnosis Date Noted  . Cerebral infarction due to embolism of left middle cerebral artery ([place]) 04/18/2015  . Chronic anticoagulation 04/18/2015  . HLD (hyperlipidemia) 04/18/2015  . Persistent atrial fibrillation ([place]) 04/18/2015  . Gait disturbance, post-stroke 03/29/2015  . Atrial fibrillation with RVR ([place]) 03/13/2015  . Dysphagia   . Left middle cerebral artery stroke ([place]) 02/02/2015  . Dysphagia, pharyngoesophageal phase 02/02/2015  . Aphasia due to recent cerebrovascular accident 02/02/2015  . Stroke with cerebral ischemia ([place])   . Stroke ([place]) 01/31/2015  . CVA (cerebral infarction) 01/31/2015  . Aphasia   . Hypokalemia   . Pulmonary fibrosis ([place]) 01/26/2015  . PCP NOTES >>>>>>>>>>>> 12/14/2014  . Vitamin D deficiency 11/04/2013  . Vasomotor rhinitis 09/22/2013  . Diarrhea 07/08/2013  . Crohn's disease ([place]) 07/08/2013  . Postinflammatory pulmonary fibrosis ([place]) 05/26/2013  . Anxiety state 05/20/2013  . Sprain of shoulder, left 06/01/2012  . Personal history of colonic polyps 04/12/2012  . Pulmonary nodules 03/02/2011  . Cough 03/02/2011  . Long term (current) use of anticoagulants 02/21/2011  . Atrial fibrillation with controlled ventricular response ([place]) 02/17/2011  . Annual physical exam 12/23/2010  . Dyspnea 09/03/2010  . HEMORRHOIDS-INTERNAL 03/08/2010  . [place] INTESTINE 03/08/2010  . RECTAL BLEEDING 03/08/2010  . HEMATOCHEZIA 03/06/2010  . Essential hypertension 02/04/2010  . HYPERTROPHY PROSTATE W/O UR OBST & OTH LUTS 12/10/2009  . Osteopenia 12/07/2009  . PERIPHERAL NEUROPATHY 08/29/2009  . FECAL INCONTINENCE 01/29/2009  . OSTEOARTHRITIS, ANKLE, RIGHT 11/01/2008  . ERECTILE DYSFUNCTION 12/02/2007  . TREMOR 12/02/2007  . CARCINOMA, SKIN, SQUAMOUS CELL 09/16/2006  . B12 DEFICIENCY 08/27/2006  . HYPERTRIGLYCERIDEMIA 08/27/2006    [facility] ,[place],  [place]  07/26/2015, 5:03 PM  [facility] [address]  [place], [place], [address] Phone: [phone]   Fax:  [phone]   Name: Brian [name] MRN: [id number] Date of Birth: 07-08-1928

## 2015-07-26 NOTE — Addendum Note (Signed)
Addended by: [name] E on: 07/26/2015 03:03 PM   Modules accepted: Orders

## 2015-07-26 NOTE — Assessment & Plan Note (Addendum)
  Anxiety -- currently well controlled Atrial fibrillation: Continue anticoagulation CVA: Continue with dysphagia, gradually improving. Working hard with speech therapy Dysphagia: On a Panda tube, last sodium decrease, we'll recheck a BMP. Has some ?s regards the feeding tubes, HH no longer visiting the patient, will  contact speech pathology for suggestions. (Addendum he recommended a dietitian referral, will do) Hemorrhoids: Symptoms as above, recommend hydrocortisone as needed and Nupercainal OTC History of vitamin D deficiency, check a vitamin D RTC 3-4 months

## 2015-07-27 ENCOUNTER — Ambulatory Visit: Payer: Medicare Other

## 2015-07-27 DIAGNOSIS — I69320 Aphasia following cerebral infarction: Secondary | ICD-10-CM

## 2015-07-27 DIAGNOSIS — R1310 Dysphagia, unspecified: Secondary | ICD-10-CM

## 2015-07-27 NOTE — Therapy (Signed)
[facility] [address] [place], [place], [address] Phone: [phone]   Fax:  [phone]  Speech Language Pathology Treatment  Patient Details  Name: Brian [name] MRN: [id number] Date of Birth: March 05, 1929 Referring Provider: Rosalin Hawking M.D.  Encounter Date: 07/27/2015      End of Session - 07/27/15 1051    Visit Number 47   Number of Visits 69   Date for SLP Re-Evaluation 08/03/15   SLP Start Time 1018   SLP Stop Time  1100   SLP Time Calculation (min) 42 min   Activity Tolerance Patient tolerated treatment well      Past Medical History  Diagnosis Date  . Muscle tremor     saw neurology remotely elsewhere, was Rx inderal  . Crohn's ileocolitis ([place])   . Osteopenia     per DEXA 11/2008  . Hypertension 11/11    mild  . Hemorrhoids   . Hypertrophy of prostate     w/o UR obst & oth luts  . Peripheral neuropathy (HCC)     h/o neg w/u  . ED (erectile dysfunction)   . Hypertriglyceridemia   . COPD (chronic obstructive pulmonary disease) ([place])     recent dx--no acute problems  . Hiatal hernia 1956    found while in service  . Anxiety   . Atrial fibrillation with RVR ([place])   . Long term (current) use of anticoagulants 02/21/2011  . Serrated adenoma of colon 03/2010  . Renal cyst     bilateral  . Pulmonary fibrosis ([place]) 2015    Rx Esbriet ~ 10-2013 ([name]), Dr. [name]  . On home oxygen therapy     "2L; 20-24h for the past week" (01/26/2015)  . Emphysema lung ([place]) 2016    Dr. [name]  . B12 deficiency anemia   . Idiopathic pulmonary fibrosis ([place]) 2016    Dr. [name]  . Pneumonia ?2015  . CAP (community acquired pneumonia) 01/26/2015  . Osteoarthritis     "maybe in my hands, feet" (01/26/2015)  . Depression   . Squamous cell carcinoma of skin of scalp     tx'd ~ 66yr; "froze them off"  . Squamous cell carcinoma, face      "froze them off"  . Melanoma of [place] ([facility]   . St[facility] ([facility]     Past  Surgical History  Procedure Laterality Date  . Colon resection  1984    resection of distal ileum and cecum w/ appendectomy, 18-inch small intestine, for Crohn's disease  . Colon surgery    . Appendectomy  1984  . Cataract extraction w/ intraocular lens  implant, bilateral Bilateral 2007  . Cholecystectomy  02/17/2011    Procedure: LAPAROSCOPIC CHOLECYSTECTOMY WITH INTRAOPERATIVE CHOLANGIOGRAM;  Surgeon: BEdward Jolly MD;  Location: [facility];  Service: General;  Laterality: N/A;  . Mohs surgery Right ~ 2014    "side of my scalp"    There were no vitals filed for this visit.      Subjective Assessment - 07/27/15 1040    Subjective Wife states [facility] following [facility].   Patient is accompained by: --  wife   Currently in Pain? No/denies               ADULT SLP TREATMENT - 07/27/15 1045    General Information   Behavior/Cognition Alert;Cooperative;Pleasant mood;Requires cueing   Treatment Provided   Treatment provided Dysphagia   Dysphagia Treatment   Temperature Spikes  Noted No   Respiratory Status Nasal cannula   Oral Cavity - Dentition Dentures, top;Dentures, bottom   Treatment Methods Skilled observation;Therapeutic exercise;Patient/caregiver education   Patient observed directly with PO's Yes   Type of PO's observed --  runny dys I   Pharyngeal Phase Signs & Symptoms Immediate throat clear;Wet vocal quality  more throat clearing today, possibly due to allergies   Other treatment/comments SLP used pt's HEP ([place], effortful swallow) and NMES with runny dys I and H2O with effortful swallows to strengthen swallow musculature for reducing aspiration risk. Pt with wet voice noted 50% of the time, effortful swallow 80% with cues. Tongue pumping noted on subsequnet swallows after approx 13 mintues of POs/NMES placement. REminded pt/wife to cont to complete entirety of HEP, as well as 1/2 cup ice chips and 1/2 cup H2O <30  minutes after oral care. Pt "hocked" slightly less frequently than SLP desired, req'd rare min A for frequency -pt followed precuations on modified barium swallow. Wife states she has pt "hock" after every swallow, and cough very frequently with POs at home.   Assessment / Recommendations / Plan   Plan --  discharge today   Dysphagia Recommendations   Diet recommendations NPO  with ice chips and H2O <30 minutes after oral care   Progression Toward Goals   Progression toward goals Not progressing toward goals (comment)  pt reached max potential at this time            SLP Short Term Goals - 06/27/15 1355    SLP SHORT TERM GOAL #1   Title pt will demo efforftul swallows 60% of the time in >100 opportunities over 3 sessions   Baseline three sesions 04-26-15   Status Achieved   SLP SHORT TERM GOAL #2   Title pt will throat clear/cough with hydrophonic voice with rare nonverbal cues   Status Achieved   SLP SHORT TERM GOAL #3   Title pt will complete HEP for dysphagia with modified independence (demo cues)   Status Achieved          SLP Long Term Goals - 07/27/15 1126    SLP LONG TERM GOAL #1   Title pt will demo effortful swallows 75% of the time in >120 opportunities over 3 sessions   Status Achieved   SLP LONG TERM GOAL #2   Title pt will complete dysphagia HEP with modified independence over 3 sessions   Baseline two sessions 05-16-15   Status Achieved   SLP LONG TERM GOAL #3   Title pt will clear voice/cough with hydrophonic voice independently 90% of the time   Status Achieved   SLP LONG TERM GOAL #4   Title pt/wife will indicate 3 s/s aspiration PNA   Status Achieved   SLP LONG TERM GOAL #5   Title pt to demo effortful swallow in 85% of swallows up to 25 minutes after NMES placement   Status Achieved   SLP LONG TERM GOAL #6   Title pt to demo effortful swallow in 90% of swallows in first 30 minutes after NMES placement   Time 4   Period Weeks   Status Not Met   Goal to continue for new reporting period (until 06-29-15)   SLP LONG TERM GOAL #7   Title with NMES placed, pt will exhibit wet voice no more than 10% of the time after initial swallow of POs over three sessions   Status Achieved   SLP LONG TERM GOAL #8   Title pt will  follow swallow precautions indicated on modfied barium swallow report 07-03-15, over 3 sessions   Status Achieved   SLP LONG TERM GOAL  #9   TITLE pt will demo effortful swallow 90% with rare min A in first 30 minutes after NMES placed over 3 sessions   Status Not Met   SLP LONG TERM GOAL  #10   TITLE pt to perform HEP with rare min A (SHaker, MAsako, Mendelsohn)   Status Partially Met  Shaker, Masako but not [place]          Plan - 08/05/15 1054    Clinical Impression Statement  Pt remains without s/s aspiration PNA. Pt/wife doing 1/2 cup water and one 1/2 cup ice chips per day, with precautions from latest modified barium swallow. Pt cont'd to demo reduced swallow strength demo'd today by tongue pumping with subsequent swallows. Pt performed cough/reswallow and "hock" spontaneously - with "hock", PO-tinged secretions were observed consistently. SThis SLP's opinion is that pt has reached max potential with traditional swallowing therapy utilizing HEP and NMES. Pt cont appeared aware of wet voice today, and again, consistently "hocked."    Speech Therapy Frequency 3x / week   Duration 1 week   Treatment/Interventions Aspiration precaution training;Pharyngeal strengthening exercises;Diet toleration management by SLP;NMES;Cueing hierarchy;Functional tasks;Patient/family education;Compensatory strategies;SLP instruction and feedback;Language facilitation   Potential to Achieve Goals Good   Potential Considerations Severity of impairments   Consulted and Agree with Plan of Care Family member/caregiver      Patient will benefit from skilled therapeutic intervention in order to improve the following deficits and impairments:    No diagnosis found.      G-Codes - Aug 05, 2015 1131    Functional Assessment Tool Used mbs report   Functional Limitations Swallowing   Swallow Goal Status (H9622) At least 60 percent but less than 80 percent impaired, limited or restricted   Swallow Discharge Status [phone]) At least 80 percent but less than 100 percent impaired, limited or restricted     [place]  Visits from Start of Care: 47   Current functional level related to goals / functional outcomes: Over the treatment course, pt made gains with swallow strength as evidenced by modified barium swallow exam on 07-03-15. Please see that report for further details. See goal update for details. Pt was regimented about completing HEP and exercises were added as pt's nonverbal oral apraxia allowed. He cont'd to have significant difficulty with Caryl Ada despite repeated attempts at teaching him this exercise. Pt's wife will cont to work with pt on the procedure of this exercise. Pt is mostly aware of wet voice, however this has decr'd slightly in the past 2-3 weeks. He "hocks" consistently, and coughs/clears throat intermittently with POs. At home with POs, wife has pt "hock" and cough/reswallow more frequently than pt may do spontaneously in the ST session.   Remaining deficits: Severe oropharyngeal dysphagia.   Education / Equipment: HEP procedure and frequency, s/s aspiration.  Plan: Patient agrees to discharge.  Patient goals were partially met. Patient is being discharged due to                                                     ?????maxed current rehab potential. Pt should receive a follow up modified barium swallow eval in approx 3 weeks to chart any possible progress with  updated exercises following last modified barium swallow exam 07-03-15. Pt may also want to consider swallowing therapy at [facility], where other means such as biofeedback can be utilized.       Problem List Patient Active Problem  List   Diagnosis Date Noted  . Cerebral infarction due to embolism of left middle cerebral artery ([place]) 04/18/2015  . Chronic anticoagulation 04/18/2015  . HLD (hyperlipidemia) 04/18/2015  . Persistent atrial fibrillation ([place]) 04/18/2015  . Gait disturbance, post-stroke 03/29/2015  . Atrial fibrillation with RVR ([place]) 03/13/2015  . Dysphagia   . Left middle cerebral artery stroke ([place]) 02/02/2015  . Dysphagia, pharyngoesophageal phase 02/02/2015  . Aphasia due to recent cerebrovascular accident 02/02/2015  . Stroke with cerebral ischemia ([place])   . Stroke ([place]) 01/31/2015  . CVA (cerebral infarction) 01/31/2015  . Aphasia   . Hypokalemia   . Pulmonary fibrosis ([place]) 01/26/2015  . PCP NOTES >>>>>>>>>>>> 12/14/2014  . Vitamin D deficiency 11/04/2013  . Vasomotor rhinitis 09/22/2013  . Diarrhea 07/08/2013  . Crohn's disease ([place]) 07/08/2013  . Postinflammatory pulmonary fibrosis ([place]) 05/26/2013  . Anxiety state 05/20/2013  . Sprain of shoulder, left 06/01/2012  . Personal history of colonic polyps 04/12/2012  . Pulmonary nodules 03/02/2011  . Cough 03/02/2011  . Long term (current) use of anticoagulants 02/21/2011  . Atrial fibrillation with controlled ventricular response ([place]) 02/17/2011  . Annual physical exam 12/23/2010  . Dyspnea 09/03/2010  . HEMORRHOIDS-INTERNAL 03/08/2010  . [place] INTESTINE 03/08/2010  . RECTAL BLEEDING 03/08/2010  . HEMATOCHEZIA 03/06/2010  . Essential hypertension 02/04/2010  . HYPERTROPHY PROSTATE W/O UR OBST & OTH LUTS 12/10/2009  . Osteopenia 12/07/2009  . PERIPHERAL NEUROPATHY 08/29/2009  . FECAL INCONTINENCE 01/29/2009  . OSTEOARTHRITIS, ANKLE, RIGHT 11/01/2008  . ERECTILE DYSFUNCTION 12/02/2007  . TREMOR 12/02/2007  . CARCINOMA, SKIN, SQUAMOUS CELL 09/16/2006  . B12 DEFICIENCY 08/27/2006  . HYPERTRIGLYCERIDEMIA 08/27/2006    [place] ,[place], [place]  07/27/2015, 11:32 AM  [facility] [address] [place], [place], [address] Phone: [phone]   Fax:  [phone]   Name: Brian [name] MRN: [id number] Date of Birth: [birth year]

## 2015-07-27 NOTE — Patient Instructions (Signed)
Keep doing the ice chips and the water at home, less than 30 minutes after good mouth care.  Keep doing all the exercises!!

## 2015-07-30 NOTE — Telephone Encounter (Signed)
Okay to refill his medication for 6 months: Pirfenidone is RF by  pulmonary Klonopin # 90 and 1 refills

## 2015-07-30 NOTE — Telephone Encounter (Signed)
Rx faxed to [facility] pharmacy.  

## 2015-07-30 NOTE — Telephone Encounter (Signed)
Rx printed, awaiting MD signature.  

## 2015-08-03 ENCOUNTER — Telehealth: Payer: Self-pay | Admitting: Neurology

## 2015-08-03 NOTE — Telephone Encounter (Signed)
Message sent to Dr.Xu. 

## 2015-08-03 NOTE — Telephone Encounter (Signed)
I called [facility] radiology and later transferred to rehab for scheduling the MBS. I left VM for speech therapy there for them to call me on my cell phone to discuss how to schedule this pt MBS with Chipper Herb. I will certainly let pt know after I hear from them. Please let wife know about this. Thanks.  Rosalin Hawking, MD PhD Stroke Neurology 08/03/2015 3:54 PM

## 2015-08-03 NOTE — Telephone Encounter (Signed)
Wife Dalene Seltzer called regarding swallow test Dr. Erlinda Hong is setting up for husband, "would like for [facility]b at [facility] to do this test. She has done the other ones and she knows what's going on".

## 2015-08-03 NOTE — Telephone Encounter (Signed)
Rehab called me back to my cell phone and they said this is not a problem. They will schedule it next Monday with scheduler and call the pt and wife to set up a date with Chipper Herb. I expressed appreciation for his help. Please let the pt know. Thanks.  Rosalin Hawking, MD PhD Stroke Neurology 08/03/2015 4:24 PM

## 2015-08-14 ENCOUNTER — Ambulatory Visit (HOSPITAL_COMMUNITY)
Admission: RE | Admit: 2015-08-14 | Discharge: 2015-08-14 | Disposition: A | Discharge status: Home / Self Care | Payer: Medicare Other | Source: Ambulatory Visit | Attending: Neurology | Admitting: Neurology

## 2015-08-14 DIAGNOSIS — R1310 Dysphagia, unspecified: Secondary | ICD-10-CM

## 2015-08-14 DIAGNOSIS — I69391 Dysphagia following cerebral infarction: Secondary | ICD-10-CM | POA: Diagnosis not present

## 2015-08-16 ENCOUNTER — Other Ambulatory Visit: Payer: Self-pay | Admitting: Internal Medicine

## 2015-08-23 ENCOUNTER — Ambulatory Visit: Payer: Self-pay | Admitting: Skilled Nursing Facility1

## 2015-09-08 ENCOUNTER — Other Ambulatory Visit: Payer: Self-pay | Admitting: Cardiology

## 2015-09-11 ENCOUNTER — Ambulatory Visit (INDEPENDENT_AMBULATORY_CARE_PROVIDER_SITE_OTHER): Payer: Medicare Other

## 2015-09-11 DIAGNOSIS — Z7901 Long term (current) use of anticoagulants: Secondary | ICD-10-CM | POA: Diagnosis not present

## 2015-09-11 DIAGNOSIS — I4891 Unspecified atrial fibrillation: Secondary | ICD-10-CM

## 2015-09-11 LAB — CBC
HCT: 40.5 % (ref 38.5–50.0)
Hemoglobin: 14.2 g/dL (ref 13.2–17.1)
MCH: 32.6 pg (ref 27.0–33.0)
MCHC: 35.1 g/dL (ref 32.0–36.0)
MCV: 92.9 fL (ref 80.0–100.0)
MPV: 11.3 fL (ref 7.5–12.5)
PLATELETS: 253 10*3/uL (ref 140–400)
RBC: 4.36 MIL/uL (ref 4.20–5.80)
RDW: 13.7 % (ref 11.0–15.0)
WBC: 7.4 10*3/uL (ref 3.8–10.8)

## 2015-09-11 LAB — BASIC METABOLIC PANEL
BUN: 22 mg/dL (ref 7–25)
CHLORIDE: 92 mmol/L — AB (ref 98–110)
CO2: 28 mmol/L (ref 20–31)
CREATININE: 0.71 mg/dL (ref 0.70–1.11)
Calcium: 9.1 mg/dL (ref 8.6–10.3)
GLUCOSE: 99 mg/dL (ref 65–99)
Potassium: 4.3 mmol/L (ref 3.5–5.3)
Sodium: 130 mmol/L — ABNORMAL LOW (ref 135–146)

## 2015-09-11 NOTE — Patient Instructions (Signed)

## 2015-09-11 NOTE — Progress Notes (Signed)
Pt was started on Eliquis 5mg  BID for afib on 01/31/15 by Dr Aundra Dubin.    Reviewed patients medication list.  Pt is not currently on any combined P-gp and strong CYP3A4 inhibitors/inducers (ketoconazole, traconazole, ritonavir, carbamazepine, phenytoin, rifampin, St. John's wort).  Reviewed labs.  SCr 0.71, Weight 67.7kg, Age 82.  Dose appropriate based on specified criteria.   Hgb and HCT Within Normal Limits 14.2/40.5 on 09/11/15.  Called spoke with pt's wife, advised labwork stable.  Continue on same dosage of Eliquis 5mg  BID.  Made 6 month Eliquis follow-up on 03/06/16.

## 2015-09-17 ENCOUNTER — Other Ambulatory Visit: Payer: Self-pay | Admitting: Internal Medicine

## 2015-10-18 ENCOUNTER — Ambulatory Visit (INDEPENDENT_AMBULATORY_CARE_PROVIDER_SITE_OTHER): Payer: Medicare Other | Admitting: Medical

## 2015-10-18 ENCOUNTER — Encounter: Payer: Self-pay | Admitting: Medical

## 2015-10-18 VITALS — BP 122/82 | HR 77 | Temp 97.9°F | Ht 71.0 in | Wt 154.1 lb

## 2015-10-18 DIAGNOSIS — R0981 Nasal congestion: Secondary | ICD-10-CM | POA: Diagnosis not present

## 2015-10-18 DIAGNOSIS — I63412 Cerebral infarction due to embolism of left middle cerebral artery: Secondary | ICD-10-CM

## 2015-10-18 DIAGNOSIS — H6691 Otitis media, unspecified, right ear: Secondary | ICD-10-CM

## 2015-10-18 MED ORDER — AZELASTINE HCL 0.1 % NA SOLN: 2.0000 | Freq: Two times a day (BID) | NASAL | 3 refills | Status: DC

## 2015-10-18 MED ORDER — CEPHALEXIN 250 MG/5ML PO SUSR: 500.0000 mg | Freq: Two times a day (BID) | ORAL | 0 refills | Status: DC

## 2015-10-18 NOTE — Progress Notes (Signed)
Pre visit review using our clinic review tool, if applicable. No additional management support is needed unless otherwise documented below in the visit note. 

## 2015-10-18 NOTE — Progress Notes (Signed)
Subjective:    Patient ID: Brian [name], male    DOB: 07-Feb-1929, 82 y.o.   MRN: [id number]  HPI  Pt in with some rt ear pain. Started at 5 am this morning. Pt has some nasal congestion for about 2 weeks with runny nose. No sneezing. Occasional cough.  Pt has occasional rare cough.  Pt had feeding tube.     Review of Systems  Constitutional: Negative for chills, fatigue and fever.  HENT: Positive for congestion and ear pain. Negative for postnasal drip, sinus pressure, sneezing and sore throat.   Respiratory: Negative for cough, chest tightness and wheezing.   Cardiovascular: Negative for chest pain and palpitations.  Gastrointestinal: Negative for abdominal pain.  Musculoskeletal: Negative for back pain.  Skin: Negative for rash.  Neurological: Negative for facial asymmetry and headaches.  Hematological: Negative for adenopathy. Does not bruise/bleed easily.  Psychiatric/Behavioral: Negative for behavioral problems and confusion.    Past Medical History:  Diagnosis Date  . Anxiety   . Atrial fibrillation with RVR ([place])   . B12 deficiency anemia   . CAP (community acquired pneumonia) 01/26/2015  . COPD (chronic obstructive pulmonary disease) ([place])    recent dx--no acute problems  . Crohn's ileocolitis ([place])   . Depression   . ED (erectile dysfunction)   . Emphysema lung ([place]) 2016   Dr. [name]  . Hemorrhoids   . Hiatal hernia 1956   found while in service  . Hypertension 11/11   mild  . Hypertriglyceridemia   . Hypertrophy of prostate    w/o UR obst & oth luts  . Idiopathic pulmonary fibrosis ([place]) 2016   Dr. [name]  . Long term (current) use of anticoagulants 02/21/2011  . Melanoma of scalp ([place])   . Muscle tremor    saw neurology remotely elsewhere, was Rx inderal  . On home oxygen therapy    "2L; 20-24h for the past week" (01/26/2015)  . Osteoarthritis    "maybe in my hands, feet" (01/26/2015)  . Osteopenia    per DEXA 11/2008  . Peripheral neuropathy  (HCC)    h/o neg w/u  . Pneumonia ?2015  . Pulmonary fibrosis ([place]) 2015   Rx Esbriet ~ 10-2013 ([name]), Dr. [name]  . Renal cyst    bilateral  . Serrated adenoma of colon 03/2010  . Squamous cell carcinoma of skin of scalp    tx'd ~ 21yrs; "froze them off"  . Squamous cell carcinoma, face     "froze them off"  . Stroke [facility])      Social History   Social History  . Marital status: Married    Spouse name: Dalene Seltzer  . Number of children: 4  . Years of education: N/A   Occupational History  . Retired   . RETIRED Retired   Social History Main Topics  . Smoking status: Former Smoker    Packs/day: 1.00    Years: 40.00    Types: Cigarettes, Pipe    Quit date: 03/24/1990  . Smokeless tobacco: Never Used  . Alcohol use No     Comment:    . Drug use: No  . Sexual activity: No   Other Topics Concern  . Not on file   Social History Narrative   Married, lives w/ wife     Past Surgical History:  Procedure Laterality Date  . APPENDECTOMY  1984  . CATARACT EXTRACTION W/ INTRAOCULAR LENS  IMPLANT, BILATERAL Bilateral 2007  . CHOLECYSTECTOMY  02/17/2011   Procedure: LAPAROSCOPIC CHOLECYSTECTOMY  WITH INTRAOPERATIVE CHOLANGIOGRAM;  Surgeon:  Jolly, MD;  Location: [facility];  Service: General;  Laterality: N/A;  . COLON RESECTION  1984   resection of distal ileum and cecum w/ appendectomy, 18-inch small intestine, for Crohn's disease  . COLON SURGERY    . MOHS SURGERY Right ~ 2014   "side of my scalp"    Family History  Problem Relation Age of Onset  . Crohn's disease Brother   . Breast cancer Mother   . Colon cancer Neg Hx   . Prostate cancer Neg Hx   . Heart attack Father 90  . Dementia Sister   . Diabetes Sister   . Stroke Mother 58    No Known Allergies  Current Outpatient Prescriptions on File Prior to Visit  Medication Sig Dispense Refill  . antiseptic oral rinse (CPC / CETYLPYRIDINIUM CHLORIDE 0.05%) 0.05 % LIQD solution 7 mLs by Mouth Rinse route 2  times daily at 12 noon and 4 pm. 946 mL 1  . busPIRone (BUSPAR) 15 MG tablet Place 1 tablet (15 mg total) into feeding tube 2 (two) times daily. 180 tablet 1  . clonazePAM (KLONOPIN) 0.5 MG tablet Take 0.5 tablets (0.25 mg total) by mouth 3 (three) times daily as needed for anxiety. 90 tablet 1  . Cyanocobalamin (NASCOBAL) 500 MCG/0.1ML SOLN Use 1 spray nasally per week as directed. 1.3 mL 6  . ELIQUIS 5 MG TABS tablet PLACE 1 TABLET INTO FEEDING TUBE TWICE A DAY 180 tablet 1  . ergocalciferol (DRISDOL) 8000 UNIT/ML drops Place 1 mL (8,000 Units total) into feeding tube daily. 60 mL 1  . fluticasone (FLONASE) 50 MCG/ACT nasal spray Place into the nose. Place 2 sprays into both nostrils continuously as needed for Rhinitis.    Marland Kitchen gemfibrozil (LOPID) 600 MG tablet Take 1 tablet (600 mg total) by mouth 2 (two) times daily. 180 tablet 1  . hydrocortisone 2.5 % cream Apply topically 2 (two) times daily. 30 g 0  . loperamide (IMODIUM) 1 MG/5ML solution Place 10 mLs (2 mg total) into feeding tube as needed for diarrhea or loose stools. (Patient taking differently: Place 2 mg into feeding tube 2 (two) times daily. ) 120 mL 1  . Mesalamine (PENTASA PO) Give 1 capsule by tube 2 (two) times daily.     . nebivolol (BYSTOLIC) 5 MG tablet Take 1 tablet (5 mg total) by mouth daily. (Patient taking differently: Place 5 mg into feeding tube daily. ) 90 tablet 3  . Nutritional Supplements (FEEDING SUPPLEMENT, JEVITY 1.2 CAL,) LIQD Place 340 mLs into feeding tube 5 (five) times daily. (Patient taking differently: Place 350 mLs into feeding tube 5 (five) times daily. )  0  . OXYGEN Inhale into the lungs continuous. 2 L    . Pirfenidone 267 MG CAPS Take 3 capsules by mouth 3 (three) times daily. Put in feeding tube (Patient taking differently: Place 801 mg into feeding tube 3 (three) times daily. 3 capsules) 270 capsule   . pravastatin (PRAVACHOL) 10 MG tablet Place 1 tablet (10 mg total) into feeding tube daily. 90 tablet  1  . Water For Irrigation, Sterile (FREE WATER) SOLN Place 150 mLs into feeding tube 3 (three) times daily. Use filtered water     No current facility-administered medications on file prior to visit.     BP 122/82 (BP Location: Right Arm, Patient Position: Sitting, Cuff Size: Normal)   Pulse 77   Temp 97.9 F (36.6 C) (Oral)   Ht 5\' 11"  (  1.803 m)   Wt 154 lb 2 oz (69.9 kg)   BMI 21.50 kg/m       Objective:   Physical Exam  General  Mental Status - Alert. General Appearance - Well groomed. Not in acute distress.  Skin Rashes- No Rashes.  HEENT Head- Normal. Ear Auditory Canal - Left- Normal. Right - mild redness at the edges.Tympanic Membrane- Left- Normal. Right- Normal. Eye Sclera/Conjunctiva- Left- Normal. Right- Normal. Nose & Sinuses Nasal Mucosa- Left-  Boggy and Congested. Right-  Boggy and  Congested.Bilateral no  maxillary and no  frontal sinus pressure. Mouth & Throat Lips: Upper Lip- Normal: no dryness, cracking, pallor, cyanosis, or vesicular eruption. Lower Lip-Normal: no dryness, cracking, pallor, cyanosis or vesicular eruption. Buccal Mucosa- Bilateral- No Aphthous ulcers. Oropharynx- No Discharge or Erythema. Tonsils: Characteristics- Bilateral- No Erythema or Congestion. Size/Enlargement- Bilateral- No enlargement. Discharge- bilateral-None.  Neck Neck- Supple. No Masses.   Chest and Lung Exam Auscultation: Breath Sounds:-Clear even and unlabored.  Cardiovascular Auscultation:Rythm- Regular, rate and rhythm. Murmurs & Other Heart Sounds:Ausculatation of the heart reveal- No Murmurs.  Lymphatic Head & Neck General Head & Neck Lymphatics: Bilateral: Description- No Localized lymphadenopathy.       Assessment & Plan:  For your recent nasal congestion will continue  flonase and will add astelin spray. Recent possible allergies.  With ear pain and redness appears early rt side ear infection following nasal congestion. Will rx cephelaxin  antibiotic.   Follow up in 7-10 days or as needed. If ear pain worsens then return.  Lynann Demetrius, Percell Miller, PA-C

## 2015-10-18 NOTE — Patient Instructions (Addendum)
For your recent nasal congestion will continue  flonase and will add astelin spray. Recent possible allergies.  With ear pain and redness appears early rt side ear infection following nasal congestion. Will rx cephelaxin antibiotic.   Follow up in 7-10 days or as needed. If ear pain worsens then return.

## 2015-10-25 ENCOUNTER — Telehealth: Payer: Self-pay | Admitting: Neurology

## 2015-10-25 DIAGNOSIS — I639 Cerebral infarction, unspecified: Secondary | ICD-10-CM

## 2015-10-25 NOTE — Telephone Encounter (Signed)
I have made the order. Thanks.  Rosalin Hawking, MD PhD Stroke Neurology 10/25/2015 2:04 PM  Orders Placed This Encounter  Procedures  . DG OP Swallowing Func-Medicare/Speech Path    Standing Status:   Future    Standing Expiration Date:   04/26/2016    Scheduling Instructions:     Please schedule early September with [facility] in [place] long. Pt is a long time pt with [facility]. Thanks much.    Order Specific Question:   Reason for Exam (SYMPTOM  OR DIAGNOSIS REQUIRED)    Answer:   dysphagia    Order Specific Question:   Preferred imaging location?    Answer:   [facility]

## 2015-10-25 NOTE — Telephone Encounter (Signed)
Wife Dalene Seltzer called regarding scheduling test for another xray/swallow test around first part of September, states he would be due for another one around August 23rd "last one ws May 23rd", prefers [facility]/[place].

## 2015-11-07 ENCOUNTER — Ambulatory Visit (INDEPENDENT_AMBULATORY_CARE_PROVIDER_SITE_OTHER): Payer: Medicare Other | Admitting: Internal Medicine

## 2015-11-07 ENCOUNTER — Encounter: Payer: Self-pay | Admitting: Internal Medicine

## 2015-11-07 VITALS — BP 124/76 | HR 63 | Temp 98.4°F | Resp 14 | Ht 71.0 in | Wt 156.5 lb

## 2015-11-07 DIAGNOSIS — I63412 Cerebral infarction due to embolism of left middle cerebral artery: Secondary | ICD-10-CM | POA: Diagnosis not present

## 2015-11-07 DIAGNOSIS — E559 Vitamin D deficiency, unspecified: Secondary | ICD-10-CM

## 2015-11-07 DIAGNOSIS — Z23 Encounter for immunization: Secondary | ICD-10-CM | POA: Diagnosis not present

## 2015-11-07 DIAGNOSIS — F411 Generalized anxiety disorder: Secondary | ICD-10-CM | POA: Diagnosis not present

## 2015-11-07 DIAGNOSIS — R1314 Dysphagia, pharyngoesophageal phase: Secondary | ICD-10-CM

## 2015-11-07 MED ORDER — ERGOCALCIFEROL 8000 UNIT/ML PO SOLN: 8000.0000 [IU] | Freq: Every day | ORAL | 1 refills | Status: DC

## 2015-11-07 NOTE — Patient Instructions (Signed)
GO TO THE FRONT DESK Schedule your next appointment for a  routine checkup in 3-4 months.  Don't  forget your flu shot this fall  Take 1 mL of vitamin D daily (through the PANDA tube)

## 2015-11-07 NOTE — Progress Notes (Signed)
Subjective:    Patient ID: Brian [name], male    DOB: 08/17/1928, 82 y.o.   MRN: [id number]  DOS:  11/07/2015 Type of visit - description : Routine visit, here with his wife Interval history: Good med compliance except for vitamin D supplements. Under the instructions of the speech therapies, he is taking small sips of water and ice chips ----> no apparent problems. The wife is giving him some extra formula because he was getting very hungry, has gained weight.   Review of Systems Denies fever chills no nausea or vomiting. no anxiety depression No cough induced by swallowing.   Past Medical History:  Diagnosis Date  . Anxiety   . Atrial fibrillation with RVR ([place])   . B12 deficiency anemia   . CAP (community acquired pneumonia) 01/26/2015  . COPD (chronic obstructive pulmonary disease) ([place])    recent dx--no acute problems  . Crohn's ileocolitis ([place])   . Depression   . ED (erectile dysfunction)   . Emphysema lung ([place]) 2016   Dr. [name]  . Hemorrhoids   . Hiatal hernia 1956   found while in service  . Hypertension 11/11   mild  . Hypertriglyceridemia   . Hypertrophy of prostate    w/o UR obst & oth luts  . Idiopathic pulmonary fibrosis ([place]) 2016   Dr. [name]  . Long term (current) use of anticoagulants 02/21/2011  . Melanoma of scalp ([place])   . Muscle tremor    saw neurology remotely elsewhere, was Rx inderal  . On home oxygen therapy    "2L; 20-24h for the past week" (01/26/2015)  . Osteoarthritis    "maybe in my hands, feet" (01/26/2015)  . Osteopenia    per DEXA 11/2008  . Peripheral neuropathy (HCC)    h/o neg w/u  . Pneumonia ?2015  . Pulmonary fibrosis ([place]) 2015   Rx Esbriet ~ 10-2013 ([name]), Dr. [name]  . Renal cyst    bilateral  . Serrated adenoma of colon 03/2010  . Squamous cell carcinoma of skin of scalp    tx'd ~ 57yrs; "froze them off"  . Squamous cell carcinoma, face     "froze them off"  . Stroke [facility])     Past Surgical History:    Procedure Laterality Date  . APPENDECTOMY  1984  . CATARACT EXTRACTION W/ INTRAOCULAR LENS  IMPLANT, BILATERAL Bilateral 2007  . CHOLECYSTECTOMY  02/17/2011   Procedure: LAPAROSCOPIC CHOLECYSTECTOMY WITH INTRAOPERATIVE CHOLANGIOGRAM;  Surgeon: Edward Jolly, MD;  Location: [facility];  Service: General;  Laterality: N/A;  . COLON RESECTION  1984   resection of distal ileum and cecum w/ appendectomy, 18-inch small intestine, for Crohn's disease  . COLON SURGERY    . MOHS SURGERY Right ~ 2014   "side of my scalp"    Social History   Social History  . Marital status: Married    Spouse name: Dalene Seltzer  . Number of children: 4  . Years of education: N/A   Occupational History  . Retired   . RETIRED Retired   Social History Main Topics  . Smoking status: Former Smoker    Packs/day: 1.00    Years: 40.00    Types: Cigarettes, Pipe    Quit date: 03/24/1990  . Smokeless tobacco: Never Used  . Alcohol use No     Comment:    . Drug use: No  . Sexual activity: No   Other Topics Concern  . Not on file   Social History Narrative  Married, lives w/ wife ;  Son [name] is a Restaurant manager, fast food, lives in [place], checks on [place] frequently          Medication List       Accurate as of 11/07/15 11:59 PM. Always use your most recent med list.          antiseptic oral rinse 0.05 % Liqd solution Commonly known as:  CPC / CETYLPYRIDINIUM CHLORIDE 0.05% 7 mLs by Mouth Rinse route 2 times daily at 12 noon and 4 pm.   azelastine 0.1 % nasal spray Commonly known as:  ASTELIN Place 2 sprays into both nostrils 2 (two) times daily. Use in each nostril as directed   busPIRone 15 MG tablet Commonly known as:  BUSPAR Place 1 tablet (15 mg total) into feeding tube 2 (two) times daily.   clonazePAM 0.5 MG tablet Commonly known as:  KLONOPIN Take 0.5 tablets (0.25 mg total) by mouth 3 (three) times daily as needed for anxiety.   Cyanocobalamin 500 MCG/0.1ML Soln Commonly known as:  NASCOBAL Use 1  spray nasally per week as directed.   ELIQUIS 5 MG Tabs tablet Generic drug:  apixaban PLACE 1 TABLET INTO FEEDING TUBE TWICE A DAY   ergocalciferol 8000 UNIT/ML drops Commonly known as:  DRISDOL Place 1 mL (8,000 Units total) into feeding tube daily.   feeding supplement (JEVITY 1.2 CAL) Liqd Place 340 mLs into feeding tube 5 (five) times daily.   fluticasone 50 MCG/ACT nasal spray Commonly known as:  FLONASE Place into the nose. Place 2 sprays into both nostrils continuously as needed for Rhinitis.   free water Soln Place 150 mLs into feeding tube 3 (three) times daily. Use filtered water   gemfibrozil 600 MG tablet Commonly known as:  LOPID Take 1 tablet (600 mg total) by mouth 2 (two) times daily.   hydrocortisone 2.5 % cream Apply topically 2 (two) times daily.   loperamide 1 MG/5ML solution Commonly known as:  IMODIUM Place 10 mLs (2 mg total) into feeding tube as needed for diarrhea or loose stools.   nebivolol 5 MG tablet Commonly known as:  BYSTOLIC Take 1 tablet (5 mg total) by mouth daily.   OXYGEN Inhale into the lungs continuous. 2 L   PENTASA PO Give 1 capsule by tube 2 (two) times daily.   Pirfenidone 267 MG Caps Take 3 capsules by mouth 3 (three) times daily. Put in feeding tube   pravastatin 10 MG tablet Commonly known as:  PRAVACHOL Place 1 tablet (10 mg total) into feeding tube daily.          Objective:   Physical Exam BP 124/76 (BP Location: Left Arm, Patient Position: Sitting, Cuff Size: Small)   Pulse 63   Temp 98.4 F (36.9 C) (Oral)   Resp 14   Ht 5\' 11"  (1.803 m)   Wt 156 lb 8 oz (71 kg)   SpO2 97%   BMI 21.83 kg/m  General:   Well developed, well nourished . NAD.  HEENT:  Normocephalic . Face symmetric, atraumatic Lungs:  decreased breath sounds but clear Normal respiratory effort, no intercostal retractions, no accessory muscle use. Heart: irreg,  no murmur.  No pretibial edema bilaterally  Skin: Not pale. Not  jaundice Psych--  Cooperative with normal attention span and concentration.  Behavior appropriate. No anxious or depressed appearing.      Assessment & Plan:   Assessment > HTN Hypertriglyceridemia Anxiety, on chronic Buspar , sx controlled  Depression 01-2015 after lost a brother  Atrial fibrillation anticoagulated CVA 01-2015: Dysphagia, aphasia, R weakness, has a PANDA   Pulmonary: --Pulmonary fibrosis, home oxygen (2 lt, 4 lt w/ exertion     --pneumonia 01-2015:  --Emphysema Tremors Crohn's colitis DJD Osteopenia DEXA 2010, DEXA 03-2014 T score -2.2, on calcium and vitamin D BPH Peripheral  neuropathy previous workup negative Skin cancer SCC B12 deficiency -- nascobal  H/o palpable abdominal aorta --- CT of the abdomen 06/2013 no AAA   PLAN: Controlled substance management :  on clonazepam for anxiety, sx controlled, UDS and contract today Dysphagia: See previous note,  He was seen by a dietitian but they didn't have any suggestions. He is tolerating ice chips by mouth , small sips of water. To have another swallow evaluation next month. Vitamin D deficiency: Was Rx supplements, not taking them, new prescription provided. Prevnar today RTC 4 months

## 2015-11-07 NOTE — Progress Notes (Signed)
Pre visit review using our clinic review tool, if applicable. No additional management support is needed unless otherwise documented below in the visit note. 

## 2015-11-08 NOTE — Assessment & Plan Note (Signed)
Controlled substance management :  on clonazepam for anxiety, sx controlled, UDS and contract today Dysphagia: See previous note,  He was seen by a dietitian but they didn't have any suggestions. He is tolerating ice chips by mouth , small sips of water. To have another swallow evaluation next month. Vitamin D deficiency: Was Rx supplements, not taking them, new prescription provided. Prevnar today RTC 4 months

## 2015-11-22 DIAGNOSIS — R0602 Shortness of breath: Secondary | ICD-10-CM | POA: Diagnosis not present

## 2015-11-22 DIAGNOSIS — Z79899 Other long term (current) drug therapy: Secondary | ICD-10-CM | POA: Diagnosis not present

## 2015-11-25 DIAGNOSIS — Z23 Encounter for immunization: Secondary | ICD-10-CM | POA: Diagnosis not present

## 2015-12-04 ENCOUNTER — Ambulatory Visit (HOSPITAL_COMMUNITY)
Admission: RE | Admit: 2015-12-04 | Discharge: 2015-12-04 | Disposition: A | Discharge status: Home / Self Care | Payer: Medicare Other | Source: Ambulatory Visit | Attending: Neurology | Admitting: Neurology

## 2015-12-04 DIAGNOSIS — I69391 Dysphagia following cerebral infarction: Secondary | ICD-10-CM | POA: Diagnosis not present

## 2015-12-04 DIAGNOSIS — R1310 Dysphagia, unspecified: Secondary | ICD-10-CM | POA: Diagnosis not present

## 2015-12-04 DIAGNOSIS — I639 Cerebral infarction, unspecified: Secondary | ICD-10-CM

## 2015-12-22 ENCOUNTER — Other Ambulatory Visit: Payer: Self-pay | Admitting: Internal Medicine

## 2015-12-27 ENCOUNTER — Telehealth: Payer: Self-pay | Admitting: Internal Medicine

## 2015-12-27 MED ORDER — CYANOCOBALAMIN 500 MCG/0.1ML NA SOLN: NASAL | 2 refills | Status: DC

## 2015-12-27 NOTE — Telephone Encounter (Signed)
Caller name: Dalene Seltzer Relationship to patient: wife Can be reached: [phone] Pharmacy: [facility], [place]  Reason for call: Pt wife called, they received letter from [employer] that Cyanocobalamin (Nascobal) needs to be sent for 90 day supply. Please sent in new RX.

## 2015-12-27 NOTE — Telephone Encounter (Signed)
Rx sent 

## 2016-01-07 ENCOUNTER — Other Ambulatory Visit: Payer: Self-pay | Admitting: Internal Medicine

## 2016-01-10 DIAGNOSIS — H35033 Hypertensive retinopathy, bilateral: Secondary | ICD-10-CM | POA: Diagnosis not present

## 2016-01-10 DIAGNOSIS — H43393 Other vitreous opacities, bilateral: Secondary | ICD-10-CM | POA: Diagnosis not present

## 2016-01-10 DIAGNOSIS — H524 Presbyopia: Secondary | ICD-10-CM | POA: Diagnosis not present

## 2016-01-10 DIAGNOSIS — H43813 Vitreous degeneration, bilateral: Secondary | ICD-10-CM | POA: Diagnosis not present

## 2016-01-10 DIAGNOSIS — H35362 Drusen (degenerative) of macula, left eye: Secondary | ICD-10-CM | POA: Diagnosis not present

## 2016-01-23 ENCOUNTER — Ambulatory Visit (INDEPENDENT_AMBULATORY_CARE_PROVIDER_SITE_OTHER): Payer: Medicare Other | Admitting: Neurology

## 2016-01-23 ENCOUNTER — Encounter: Payer: Self-pay | Admitting: Neurology

## 2016-01-23 VITALS — BP 147/87 | HR 55 | Ht 71.0 in | Wt 160.4 lb

## 2016-01-23 DIAGNOSIS — I69320 Aphasia following cerebral infarction: Secondary | ICD-10-CM | POA: Diagnosis not present

## 2016-01-23 DIAGNOSIS — R1314 Dysphagia, pharyngoesophageal phase: Secondary | ICD-10-CM | POA: Diagnosis not present

## 2016-01-23 DIAGNOSIS — I63412 Cerebral infarction due to embolism of left middle cerebral artery: Secondary | ICD-10-CM

## 2016-01-23 DIAGNOSIS — Z7901 Long term (current) use of anticoagulants: Secondary | ICD-10-CM | POA: Diagnosis not present

## 2016-01-23 DIAGNOSIS — I481 Persistent atrial fibrillation: Secondary | ICD-10-CM

## 2016-01-23 DIAGNOSIS — I4819 Other persistent atrial fibrillation: Secondary | ICD-10-CM

## 2016-01-23 DIAGNOSIS — I63512 Cerebral infarction due to unspecified occlusion or stenosis of left middle cerebral artery: Secondary | ICD-10-CM | POA: Diagnosis not present

## 2016-01-23 NOTE — Patient Instructions (Addendum)
-   continue eliquis and pravastatin for stroke prevention - continue self speech and physical exercise  - will refer to see Dr. Letta Pate again about PEG tube removal evaluation - follow up with [name] as scheduled - Follow up with your primary care physician for stroke risk factor modification. Recommend maintain blood pressure goal around 120-130/80, diabetes with hemoglobin A1c goal below 6.5% and lipids with LDL cholesterol goal below 70 mg/dL.  - check BP at home. - follow up in 6 months.

## 2016-01-23 NOTE — Progress Notes (Signed)
STROKE NEUROLOGY FOLLOW UP NOTE  NAME: Brian [name] DOB: 08-10-1928  REASON FOR VISIT: stroke follow up HISTORY FROM: wife and chart  Today we had the pleasure of seeing Brian [name] in follow-up at our Neurology Clinic. Pt was accompanied by wife.   History Summary Brian [name] is a 82 y.o. male with history of pulmonary fibrosis, atrial fibrillation on coumadin and crohn's disease presenting with right sided weakness and difficulty speaking to [facility] on 01/31/15. He did not receive IV t-PA due to elevated INR 1.9 on coumadin. MRI showed left MCA infarct. MRA and CTA showed posterior distal left MCA branch occlusion. TTE unremarkable. LDL 86 and A1C 5.6. His right-sided weakness resolved but continued to have receptive aphasia. His coumadin was switched to eliquis and started on low dose pravastatin on discharge.  04/18/15 follow up - the patient has been doing well from stroke standpoint. Still has receptive aphasia and currently works with speech therapy. The aphasia fluctuate at home more prominent with fatigue or tiredness. He still has swallow difficulty and still use TF via PEG. Follows with speech in march to see if PEG can be removed. He has follow up with pulmonary in [name] next week. Currently still on home O2. BP 111/67 today. He still has afib on eliquis and also on bystolic for rate control.  07/23/15 follow up - pt has no recurrent stroke like symptoms. Followed with speech closely, MBS on 07/03/15, and now on dysphagia 1 diet (puree) and thin liquids only during speech therapy visit. At home still NOP except ice chips. Doing language exercise at home, will need MBS in 4 weeks. Had epistaxis vs. Hemoptysis in 04/2015 with ED visit but stable CXR and no significant bleeding. Medication all continued. BP today 132/69  Interval History During the interval time, pt has been doing well. Past swallow test in 11/2015 and currently at dysphagia diet 3 with thin liquid, pt tolerating well, no  choking. However, he still use PEG tube for medication and sometime nutritional supplement. Not sure if nutritional sufficient. Will let Dr. Letta Pate take a look. He saw Dr. Letta Pate early this year but lost follow up. Still on eliquis without side effect. Follows with [name] pulmonary and pulmonary fibrosis stable, still on home O2. BP stable at home, today 147/87. Still has crohn's disease on mesalamine. He is more active as before, daily biking for 6 miles.   REVIEW OF SYSTEMS: Full 14 system review of systems performed and notable only for those listed below and in HPI above, all others are negative:  Constitutional:   Cardiovascular:  Ear/Nose/Throat:  Skin:  Eyes:   Respiratory:   Gastroitestinal:   Genitourinary:  Hematology/Lymphatic:   Endocrine:  Musculoskeletal:   Allergy/Immunology:   Neurological:   Psychiatric:  Sleep:   The following represents the patient's updated allergies and side effects list: No Known Allergies  The neurologically relevant items on the patient's problem list were reviewed on today's visit.  Neurologic Examination  A problem focused neurological exam (12 or more points of the single system neurologic examination, vital signs counts as 1 point, cranial nerves count for 8 points) was performed.  Blood pressure (!) 147/87, pulse (!) 55, height 5\' 11"  (1.803 m), weight 160 lb 6.4 oz (72.8 kg).  General - cachetic, well developed, in no apparent distress.  Ophthalmologic - Fundi not visualized due to noncooperatoin.  Cardiovascular - irregularly irregular heart rate and rhythm.  Mental Status -  Awake alert, pleasant. Speech  output fluent with intermittent word finding difficulty.   Cranial Nerves II - XII - II - blinking to visual threat bilaterally. III, IV, VI - Extraocular movements intact. V - Facial sensation intact bilaterally. VII - Facial movement intact bilaterally. VIII - Vestibular intact bilaterally. X - Palate elevates  symmetrically. XI - Chin turning & shoulder shrug intact bilaterally. XII - Tongue protrusion intact.  Motor Strength - The patient's strength was normal in all extremities and pronator drift was absent.  Bulk was normal and fasciculations were absent.   Motor Tone - Muscle tone was assessed at the neck and appendages and was normal.  Reflexes - The patient's reflexes were 1+ in all extremities and he had no pathological reflexes.  Sensory - Light touch, temperature/pinprick were assessed and were normal.    Coordination - The patient had normal movements in the hands and feet with no ataxia or dysmetria.    Gait and Station - able to walk without cane, slow cautious gait but stable.  Data reviewed: I personally reviewed the images and agree with the radiology interpretations.  Ct Head Wo Contrast 01/31/2015 Mild chronic microvascular ischemia. No acute abnormality.   Ct Angio Head & Neck W/cm &/or Wo/cm 01/31/2015 1. No large vessel occlusion. 2. Left M2 MCA branch vessel occlusion. 3. No cervical carotid or vertebral artery stenosis. 4. 2 mm left posterior communicating artery infundibulum versus aneurysm. 5. Extensive right upper lobe lung consolidation with areas of cavitation, incompletely visualized. Mild mediastinal lymphadenopathy.   MRI & MRA Brain Wo Contrast 01/31/2015 1. Acute nonhemorrhagic posterior left MCA territory infarct is confirmed. 2. Occlusion of distal left posterior MCA branch vessel. There is opacification of an additional left MCA branch vessels since the earlier CTA study. 3. Extensive white matter disease is present in addition to the acute infarct.   Dg Chest Port 1 View 01/31/2015 Slight worsening of RIGHT upper lobe pneumonia superimposed on emphysematous change.   2D Echocardiogram  - Left ventricle: The cavity size was normal. Wall thickness wasincreased in a pattern of mild LVH. Systolic function was normal.The estimated ejection fraction was  in the range of 55% to 60%.Regional wall motion abnormalities cannot be excluded. - Mitral valve: Calcified annulus. There was mild regurgitation. - Right atrium: The atrium was mildly dilated. - Pulmonary arteries: PA peak pressure: 34 mm Hg (S).  Component     Latest Ref Rng 01/31/2015 02/01/2015  Cholesterol     0 - 200 mg/dL  103  Triglycerides     <150 mg/dL  72  HDL Cholesterol     >40 mg/dL  33 (L)  Total CHOL/HDL Ratio       3.1  VLDL     0 - 40 mg/dL  14  LDL (calc)     0 - 99 mg/dL  56  Hemoglobin A1C     4.8 - 5.6 % 5.6   Mean Plasma Glucose      114     Assessment: As you may recall, he is a 82 y.o. Caucasian male with PMH of pulmonary fibrosis, atrial fibrillation on coumadin and crohn's disease admitted on 01/31/15 for left MCA infarct. MRA and CTA showed posterior distal left MCA branch occlusion. TTE unremarkable. LDL 86 and A1C 5.6. His INR on admission 1.9. His right-sided weakness resolved but continued to have receptive aphasia. His coumadin was switched to eliquis and started on low dose pravastatin on discharge. During the interval time, he is doing well from stroke standpoint, but  still has mild expressive aphasia and dysphagia. Working with speech therapy closely and passed swallow in 11/2015, currently on dysphagia diet 3 and thin liquid. No significant bleeding side effect with eliquis. Will like him to see Dr. Letta Pate again regarding PEG and nutritional needs.   Plan:  - continue eliquis and pravastatin for stroke prevention - continue self speech and physical exercise  - will ask pt to see Dr. Letta Pate again about PEG tube removal evaluation - follow up with [name] pulmonary as scheduled - Follow up with your primary care physician for stroke risk factor modification. Recommend maintain blood pressure goal around 120-130/80, diabetes with hemoglobin A1c goal below 6.5% and lipids with LDL cholesterol goal below 70 mg/dL.  - check BP at home. - follow up in  6 months.  I spent more than 25 minutes of face to face time with the patient. Greater than 50% of time was spent in counseling and coordination of care. We discussed about PEG tube removal, continue active in life and follow up with pulmonary at [facility].    Orders Placed This Encounter  Procedures  . Ambulatory referral to Physical Medicine Rehab    Referral Priority:   Routine    Referral Type:   Rehabilitation    Referral Reason:   Specialty Services Required    Referred to Provider:   Charlett Blake, MD    Requested Specialty:   Physical Medicine and Rehabilitation    Number of Visits Requested:   1    Meds ordered this encounter  Medications  . Multiple Vitamin (DAILY VITAMIN PO)    Sig: Take by mouth.    Patient Instructions  - continue eliquis and pravastatin for stroke prevention - continue self speech and physical exercise  - will refer to see Dr. Letta Pate again about PEG tube removal evaluation - follow up with [name] pulmonary as scheduled - Follow up with your primary care physician for stroke risk factor modification. Recommend maintain blood pressure goal around 120-130/80, diabetes with hemoglobin A1c goal below 6.5% and lipids with LDL cholesterol goal below 70 mg/dL.  - check BP at home. - follow up in 6 months.   Rosalin Hawking, MD PhD [facility] [address], [place], [place] [address] [phone]

## 2016-02-12 ENCOUNTER — Other Ambulatory Visit: Payer: Self-pay | Admitting: Cardiology

## 2016-02-12 DIAGNOSIS — I482 Chronic atrial fibrillation, unspecified: Secondary | ICD-10-CM

## 2016-02-20 ENCOUNTER — Encounter: Payer: Self-pay | Admitting: Internal Medicine

## 2016-02-20 ENCOUNTER — Ambulatory Visit (INDEPENDENT_AMBULATORY_CARE_PROVIDER_SITE_OTHER): Payer: Medicare Other | Admitting: Internal Medicine

## 2016-02-20 VITALS — BP 124/76 | HR 81 | Temp 98.0°F | Resp 14 | Ht 71.0 in | Wt 161.5 lb

## 2016-02-20 DIAGNOSIS — E785 Hyperlipidemia, unspecified: Secondary | ICD-10-CM

## 2016-02-20 DIAGNOSIS — E538 Deficiency of other specified B group vitamins: Secondary | ICD-10-CM | POA: Diagnosis not present

## 2016-02-20 DIAGNOSIS — F411 Generalized anxiety disorder: Secondary | ICD-10-CM

## 2016-02-20 DIAGNOSIS — I10 Essential (primary) hypertension: Secondary | ICD-10-CM | POA: Diagnosis not present

## 2016-02-20 DIAGNOSIS — E559 Vitamin D deficiency, unspecified: Secondary | ICD-10-CM

## 2016-02-20 DIAGNOSIS — I63412 Cerebral infarction due to embolism of left middle cerebral artery: Secondary | ICD-10-CM | POA: Diagnosis not present

## 2016-02-20 DIAGNOSIS — R739 Hyperglycemia, unspecified: Secondary | ICD-10-CM

## 2016-02-20 LAB — COMPREHENSIVE METABOLIC PANEL
ALBUMIN: 3.8 g/dL (ref 3.5–5.2)
ALK PHOS: 162 U/L — AB (ref 39–117)
ALT: 13 U/L (ref 0–53)
AST: 24 U/L (ref 0–37)
BUN: 23 mg/dL (ref 6–23)
CHLORIDE: 98 meq/L (ref 96–112)
CO2: 32 mEq/L (ref 19–32)
Calcium: 9.6 mg/dL (ref 8.4–10.5)
Creatinine, Ser: 0.78 mg/dL (ref 0.40–1.50)
GFR: 100.38 mL/min (ref >60.00)
Glucose, Bld: 95 mg/dL (ref 70–99)
POTASSIUM: 4.6 meq/L (ref 3.5–5.1)
Sodium: 136 mEq/L (ref 135–145)
TOTAL PROTEIN: 8.3 g/dL (ref 6.0–8.3)
Total Bilirubin: 0.3 mg/dL (ref 0.2–1.2)

## 2016-02-20 LAB — HEMOGLOBIN A1C: HEMOGLOBIN A1C: 5.6 % (ref 4.6–6.5)

## 2016-02-20 LAB — LIPID PANEL
CHOLESTEROL: 165 mg/dL (ref 0–200)
HDL: 67.7 mg/dL (ref >39.00)
LDL CALC: 80 mg/dL (ref 0–99)
NonHDL: 97.69
TRIGLYCERIDES: 87 mg/dL (ref 0.0–149.0)
Total CHOL/HDL Ratio: 2
VLDL: 17.4 mg/dL (ref 0.0–40.0)

## 2016-02-20 LAB — VITAMIN B12: Vitamin B-12: 496 pg/mL (ref 211–911)

## 2016-02-20 MED ORDER — ERGOCALCIFEROL 8000 UNIT/ML PO SOLN: 8000.0000 [IU] | ORAL | 1 refills | Status: DC

## 2016-02-20 NOTE — Patient Instructions (Signed)
GO TO THE LAB : Get the blood work     GO TO THE FRONT DESK Schedule your next appointment for a  Check up in 4-5 months   

## 2016-02-20 NOTE — Progress Notes (Signed)
Subjective:    Patient ID: Brian [name], male    DOB: 09-Apr-1928, 82 y.o.   MRN: [id number]  DOS:  02/20/2016 Type of visit - description : rov, Here with his wife Interval history: HTN: On Bystolic, BP today is very good CVA -- making progress, able to eat soft foods B12 deficiency: Good compliance with supplements He is also on high doses of vitamin D. High chol--  On Pravachol and fenofibrate, due for labs Hyperglycemia: Due for labs Crohn's disease: On Pentasa, asymptomatic.   Review of Systems Denies chest pain, breathing is very good, on oxygen supplements No diarrhea blood in the stools or abdominal pain No cough He is in good spirits, denies anxiety or depression at this time, on BuSpar.   Past Medical History:  Diagnosis Date  . Anxiety   . Atrial fibrillation with RVR ([place])   . B12 deficiency anemia   . CAP (community acquired pneumonia) 01/26/2015  . COPD (chronic obstructive pulmonary disease) ([place])    recent dx--no acute problems  . Crohn's ileocolitis ([place])   . Depression   . ED (erectile dysfunction)   . Emphysema lung ([place]) 2016   Dr. [name]  . Hemorrhoids   . Hiatal hernia 1956   found while in service  . Hypertension 11/11   mild  . Hypertriglyceridemia   . Hypertrophy of prostate    w/o UR obst & oth luts  . Idiopathic pulmonary fibrosis ([place]) 2016   Dr. [name]  . Long term (current) use of anticoagulants 02/21/2011  . Melanoma of scalp ([place])   . Muscle tremor    saw neurology remotely elsewhere, was Rx inderal  . On home oxygen therapy    "2L; 20-24h for the past week" (01/26/2015)  . Osteoarthritis    "maybe in my hands, feet" (01/26/2015)  . Osteopenia    per DEXA 11/2008  . Peripheral neuropathy ([facility])    h/o neg w/u  . Pneumonia ?2015  . Pulmonary fibrosis ([place]) 2015   Rx Esbriet ~ 10-2013 ([name]), Dr. [name]  . Renal cyst    bilateral  . Serrated adenoma of colon 03/2010  . Squamous cell carcinoma of skin of scalp    tx'd ~ 50yrs;  "froze them off"  . Squamous cell carcinoma, face     "froze them off"  . Stroke [facility])     Past Surgical History:  Procedure Laterality Date  . APPENDECTOMY  1984  . CATARACT EXTRACTION W/ INTRAOCULAR LENS  IMPLANT, BILATERAL Bilateral 2007  . CHOLECYSTECTOMY  02/17/2011   Procedure: LAPAROSCOPIC CHOLECYSTECTOMY WITH INTRAOPERATIVE CHOLANGIOGRAM;  Surgeon: Edward Jolly, MD;  Location: [facility] ORS;  Service: General;  Laterality: N/A;  . COLON RESECTION  1984   resection of distal ileum and cecum w/ appendectomy, 18-inch small intestine, for Crohn's disease  . COLON SURGERY    . MOHS SURGERY Right ~ 2014   "side of my scalp"    Social History   Social History  . Marital status: Married    Spouse name: Dalene Seltzer  . Number of children: 4  . Years of education: N/A   Occupational History  . Retired   . RETIRED Retired   Social History Main Topics  . Smoking status: Former Smoker    Packs/day: 1.00    Years: 40.00    Types: Cigarettes, Pipe    Quit date: 03/24/1990  . Smokeless tobacco: Never Used  . Alcohol use No     Comment:    . Drug  use: No  . Sexual activity: No   Other Topics Concern  . Not on file   Social History Narrative   Married, lives w/ wife ;  Son [name] is a Restaurant manager, fast food, lives in [place], checks on [place] frequently          Medication List       Accurate as of 02/20/16 11:59 PM. Always use your most recent med list.          busPIRone 15 MG tablet Commonly known as:  BUSPAR Place 1 tablet (15 mg total) into feeding tube 2 (two) times daily.   BYSTOLIC 5 MG tablet Generic drug:  nebivolol TAKE 1 TABLET DAILY   Cyanocobalamin 500 MCG/0.1ML Soln Commonly known as:  NASCOBAL Use 1 spray nasally per week as directed.   DAILY VITAMIN PO Take by mouth.   ELIQUIS 5 MG Tabs tablet Generic drug:  apixaban PLACE 1 TABLET INTO FEEDING TUBE TWICE A DAY   ergocalciferol 8000 UNIT/ML drops Commonly known as:  DRISDOL Place 1 mL (8,000 Units total)  into feeding tube 3 (three) times a week.   feeding supplement (JEVITY 1.2 CAL) Liqd Place 340 mLs into feeding tube 5 (five) times daily.   fluticasone 50 MCG/ACT nasal spray Commonly known as:  FLONASE Place into the nose. Place 2 sprays into both nostrils continuously as needed for Rhinitis.   free water Soln Place 150 mLs into feeding tube 3 (three) times daily. Use filtered water   gemfibrozil 600 MG tablet Commonly known as:  LOPID Take 1 tablet (600 mg total) by mouth 2 (two) times daily.   hydrocortisone 2.5 % cream Apply topically 2 (two) times daily.   loperamide 1 MG/5ML solution Commonly known as:  IMODIUM Place 10 mLs (2 mg total) into feeding tube as needed for diarrhea or loose stools.   OXYGEN Inhale into the lungs continuous. 2 L   PENTASA PO Give 1 capsule by tube 2 (two) times daily.   Pirfenidone 267 MG Caps Take 3 capsules by mouth 3 (three) times daily. Put in feeding tube   pravastatin 10 MG tablet Commonly known as:  PRAVACHOL Take 1 tablet into feeding tube daily          Objective:   Physical Exam BP 124/76 (BP Location: Right Arm, Patient Position: Sitting, Cuff Size: Small)   Pulse 81   Temp 98 F (36.7 C) (Oral)   Resp 14   Ht 5\' 11"  (1.803 m)   Wt 161 lb 8 oz (73.3 kg)   SpO2 96%   BMI 22.52 kg/m  General:   Well developed, well nourished . NAD.  HEENT:  Normocephalic . Face symmetric, atraumatic Lungs:  decreased breath sounds but clear Normal respiratory effort, no intercostal retractions, no accessory muscle use. Heart: irreg,  no murmur.  No pretibial edema bilaterally  Skin: Not pale. Not jaundice Psych--  Cooperative with normal attention span and concentration.  Behavior appropriate. No anxious or depressed appearing. In great spirits today     Assessment & Plan:   Assessment > HTN Hypertriglyceridemia Anxiety, on chronic Buspar , sx controlled  Depression 01-2015 after lost a brother  Atrial fibrillation  anticoagulated CVA 01-2015: Dysphagia, aphasia, R weakness, has a PANDA   Pulmonary: --Pulmonary fibrosis, home oxygen (2 lt, 4 lt w/ exertion     --pneumonia 01-2015:  --Emphysema Tremors Crohn's colitis DJD Osteopenia DEXA 2010, DEXA 03-2014 T score -2.2, on calcium and vitamin D BPH Peripheral  neuropathy previous workup negative  Skin cancer SCC B12 deficiency -- nascobal  H/o palpable abdominal aorta --- CT of the abdomen 06/2013 no AAA   PLAN: Mild hyperglycemia: Check A1c HTN: Continue Bystolic, check a CMP. Hypertriglyceridemia: On Pravachol and Lopid. Check a FLP Anxiety depression: Controlled on BuSpar, very rarely needs clonazepam. CVA:  making great progress with food intake by mouth. Pulmonary fibrosis: Note from pulmonary reviewed, stable. Continue with oxygen. B12 deficiency: On Nascobal, check labs On vitamin D, check levels. Reproduce supplements to 1 mL 3 times a week. RTC 4-5 months

## 2016-02-20 NOTE — Progress Notes (Signed)
Pre visit review using our clinic review tool, if applicable. No additional management support is needed unless otherwise documented below in the visit note. 

## 2016-02-21 NOTE — Assessment & Plan Note (Signed)
Mild hyperglycemia: Check A1c HTN: Continue Bystolic, check a CMP. Hypertriglyceridemia: On Pravachol and Lopid. Check a FLP Anxiety depression: Controlled on BuSpar, very rarely needs clonazepam. CVA:  making great progress with food intake by mouth. Pulmonary fibrosis: Note from pulmonary reviewed, stable. Continue with oxygen. B12 deficiency: On Nascobal, check labs On vitamin D, check levels. Reproduce supplements to 1 mL 3 times a week. RTC 4-5 months

## 2016-02-23 LAB — VITAMIN D 1,25 DIHYDROXY
VITAMIN D 1, 25 (OH) TOTAL: 26 pg/mL (ref 18–72)
VITAMIN D2 1, 25 (OH): 11 pg/mL
VITAMIN D3 1, 25 (OH): 15 pg/mL

## 2016-02-27 ENCOUNTER — Other Ambulatory Visit: Payer: Self-pay | Admitting: Gastroenterology

## 2016-03-06 ENCOUNTER — Ambulatory Visit (INDEPENDENT_AMBULATORY_CARE_PROVIDER_SITE_OTHER): Payer: Medicare Other | Admitting: *Deleted

## 2016-03-06 DIAGNOSIS — I63412 Cerebral infarction due to embolism of left middle cerebral artery: Secondary | ICD-10-CM | POA: Diagnosis not present

## 2016-03-06 DIAGNOSIS — Z5181 Encounter for therapeutic drug level monitoring: Secondary | ICD-10-CM

## 2016-03-06 DIAGNOSIS — I4891 Unspecified atrial fibrillation: Secondary | ICD-10-CM | POA: Diagnosis not present

## 2016-03-06 LAB — BASIC METABOLIC PANEL
BUN: 22 mg/dL (ref 7–25)
CO2: 28 mmol/L (ref 20–31)
CREATININE: 0.79 mg/dL (ref 0.70–1.11)
Calcium: 8.9 mg/dL (ref 8.6–10.3)
Chloride: 99 mmol/L (ref 98–110)
Glucose, Bld: 104 mg/dL — ABNORMAL HIGH (ref 65–99)
POTASSIUM: 4.2 mmol/L (ref 3.5–5.3)
Sodium: 136 mmol/L (ref 135–146)

## 2016-03-06 LAB — CBC
HCT: 41.7 % (ref 38.5–50.0)
Hemoglobin: 14.2 g/dL (ref 13.2–17.1)
MCH: 32.9 pg (ref 27.0–33.0)
MCHC: 34.1 g/dL (ref 32.0–36.0)
MCV: 96.5 fL (ref 80.0–100.0)
MPV: 11.5 fL (ref 7.5–12.5)
Platelets: 215 10*3/uL (ref 140–400)
RBC: 4.32 MIL/uL (ref 4.20–5.80)
RDW: 13.6 % (ref 11.0–15.0)
WBC: 7.5 10*3/uL (ref 3.8–10.8)

## 2016-03-06 NOTE — Progress Notes (Signed)
Pt was started on Eliquis 5mg s for Afib BID on 01/31/2015.    Reviewed patients medication list.  Pt is not  currently on any combined P-gp and strong CYP3A4 inhibitors/inducers (ketoconazole, traconazole, ritonavir, carbamazepine, phenytoin, rifampin, St. John's wort).  Reviewed labs.  SCr 0.79, Weight 73.3Kg, CrCl- 70.27ml/min.  Dose appropriate based on age, weight, and SCr.  Hgb and HCT 14.2/41.7  A full discussion of the nature of anticoagulants has been carried out.  A benefit/risk analysis has been presented to the patient, so that they understand the justification for choosing anticoagulation with Eliquis at this time.  The need for compliance is stressed.  Pt is aware to take the medication twice daily.  Side effects of potential bleeding are discussed, including unusual colored urine or stools, coughing up blood or coffee ground emesis, nose bleeds or serious fall or head trauma.  Discussed signs and symptoms of stroke. The patient should avoid any OTC items containing aspirin or ibuprofen.  Avoid alcohol consumption.   Call if any signs of abnormal bleeding.  Discussed financial obligations and resolved any difficulty in obtaining medication.  Next lab  test in 6 months on 09/04/2016.

## 2016-03-08 ENCOUNTER — Other Ambulatory Visit: Payer: Self-pay | Admitting: Cardiology

## 2016-03-31 ENCOUNTER — Telehealth: Payer: Self-pay

## 2016-03-31 NOTE — Telephone Encounter (Signed)
[name] [employer] Physician Verification form completed and faxed to [employer] at [phone]. Forms sent for scanning.

## 2016-03-31 NOTE — Telephone Encounter (Signed)
Received fax confirmation

## 2016-04-02 ENCOUNTER — Other Ambulatory Visit: Payer: Self-pay | Admitting: Internal Medicine

## 2016-04-13 DIAGNOSIS — J84112 Idiopathic pulmonary fibrosis: Secondary | ICD-10-CM | POA: Diagnosis not present

## 2016-04-13 DIAGNOSIS — J8410 Pulmonary fibrosis, unspecified: Secondary | ICD-10-CM | POA: Diagnosis not present

## 2016-04-15 ENCOUNTER — Ambulatory Visit (HOSPITAL_BASED_OUTPATIENT_CLINIC_OR_DEPARTMENT_OTHER): Payer: Medicare HMO | Admitting: Physical Medicine & Rehabilitation

## 2016-04-15 ENCOUNTER — Encounter: Payer: Medicare HMO | Attending: Physical Medicine & Rehabilitation

## 2016-04-15 ENCOUNTER — Encounter: Payer: Self-pay | Admitting: Physical Medicine & Rehabilitation

## 2016-04-15 VITALS — BP 132/73 | HR 84

## 2016-04-15 DIAGNOSIS — Z800 Family history of malignant neoplasm of digestive organs: Secondary | ICD-10-CM | POA: Insufficient documentation

## 2016-04-15 DIAGNOSIS — Z9049 Acquired absence of other specified parts of digestive tract: Secondary | ICD-10-CM | POA: Insufficient documentation

## 2016-04-15 DIAGNOSIS — I69320 Aphasia following cerebral infarction: Secondary | ICD-10-CM | POA: Insufficient documentation

## 2016-04-15 DIAGNOSIS — Z803 Family history of malignant neoplasm of breast: Secondary | ICD-10-CM | POA: Diagnosis not present

## 2016-04-15 DIAGNOSIS — J8410 Pulmonary fibrosis, unspecified: Secondary | ICD-10-CM | POA: Diagnosis not present

## 2016-04-15 DIAGNOSIS — Z8249 Family history of ischemic heart disease and other diseases of the circulatory system: Secondary | ICD-10-CM | POA: Diagnosis not present

## 2016-04-15 DIAGNOSIS — Z9981 Dependence on supplemental oxygen: Secondary | ICD-10-CM | POA: Diagnosis not present

## 2016-04-15 DIAGNOSIS — Z9889 Other specified postprocedural states: Secondary | ICD-10-CM | POA: Insufficient documentation

## 2016-04-15 DIAGNOSIS — Z823 Family history of stroke: Secondary | ICD-10-CM | POA: Diagnosis not present

## 2016-04-15 DIAGNOSIS — Z8042 Family history of malignant neoplasm of prostate: Secondary | ICD-10-CM | POA: Diagnosis not present

## 2016-04-15 DIAGNOSIS — Z931 Gastrostomy status: Secondary | ICD-10-CM | POA: Insufficient documentation

## 2016-04-15 DIAGNOSIS — K5090 Crohn's disease, unspecified, without complications: Secondary | ICD-10-CM | POA: Insufficient documentation

## 2016-04-15 DIAGNOSIS — J449 Chronic obstructive pulmonary disease, unspecified: Secondary | ICD-10-CM | POA: Insufficient documentation

## 2016-04-15 DIAGNOSIS — R1314 Dysphagia, pharyngoesophageal phase: Secondary | ICD-10-CM

## 2016-04-15 DIAGNOSIS — Z87891 Personal history of nicotine dependence: Secondary | ICD-10-CM | POA: Insufficient documentation

## 2016-04-15 NOTE — Patient Instructions (Signed)
Please take all pills crushed and mixed with either applesauce, pudding, or cream of wheat. No medications through the tube.  Please do not give any Jevity through the tube. Recommend eating 3 meals a day along with snacks between the meals and a bedtime snack. If patient eats less than half of his meal, have him drink a can of Jevity by mouth  Please weigh the patient twice a week and write down the weights and bring it in for the next visit. If his weight drops 5 pounds or less, or if he gains weight. We can send him to the radiology department to discontinue the tube

## 2016-04-15 NOTE — Progress Notes (Signed)
Subjective:    Patient ID: Brian [name], male    DOB: [birth year], 83 y.o.   MRN: [id number]  HPI 83 yo male with hx of Left posterior MCA infarct 01/31/2015  with aphasia, R HP, Dysphagia S/p PEG, Placed in 2016  History of pulmonary fibrosis, on chronic O2 as well as Crohn's disease  Speech therapist has finished a 2017, no dietary restrictions remain  Now taking po , eats cream of wheat and Sausage for breakfast  Eats half a sandwich and soup for lunch  Chicken casserole, beef and gravy, pizza   Takes Pentasa and Immodium via po route  Using a feeding tube to administer pravastatin, perfenidone, multivitamin, Lopid, ergocalciferol, BuSpar and Eliquis     Still gets 1.5 cans of Jevity in the morning, one can at noon and one can at night.  Patient is at weight  Goal of 162 pounds, at one point after stroke was 130 pounds  Pain Inventory Average Pain 0 Pain Right Now 0 My pain is .  In the last 24 hours, has pain interfered with the following? General activity 0 Relation with others 0 Enjoyment of life 0 What TIME of day is your pain at its worst? . Sleep (in general) .  Pain is worse with: . Pain improves with: . Relief from Meds: .  Mobility walk without assistance ability to climb steps?  yes  Function retired  Neuro/Psych No problems in this area  Prior Studies Any changes since last visit?  no  Physicians involved in your care Any changes since last visit?  no   Family History  Problem Relation Age of Onset  . Breast cancer Mother   . Stroke Mother 72  . Heart attack Father 64  . Crohn's disease Brother   . Dementia Sister   . Diabetes Sister   . Colon cancer Neg Hx   . Prostate cancer Neg Hx    Social History   Social History  . Marital status: Married    Spouse name: Dalene Seltzer  . Number of children: 4  . Years of education: N/A   Occupational History  . Retired   . RETIRED Retired   Social History Main Topics  . Smoking  status: Former Smoker    Packs/day: 1.00    Years: 40.00    Types: Cigarettes, Pipe    Quit date: 03/24/1990  . Smokeless tobacco: Never Used  . Alcohol use No     Comment:    . Drug use: No  . Sexual activity: No   Other Topics Concern  . 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 APPENDECTOMY  1984  . CATARACT EXTRACTION W/ INTRAOCULAR LENS  IMPLANT, BILATERAL Bilateral 2007  . CHOLECYSTECTOMY  02/17/2011   Procedure: LAPAROSCOPIC CHOLECYSTECTOMY WITH INTRAOPERATIVE CHOLANGIOGRAM;  Surgeon: Edward Jolly, MD;  Location: [facility];  Service: General;  Laterality: N/A;  . COLON RESECTION  1984   resection of distal ileum and cecum w/ appendectomy, 18-inch small intestine, for Crohn's disease  . COLON SURGERY    . MOHS SURGERY Right ~ 2014   "side of my scalp"   Past Medical History:  Diagnosis Date  . Anxiety   . Atrial fibrillation with RVR ([place])   . B12 deficiency anemia   . CAP (community acquired pneumonia) 01/26/2015  .  COPD (chronic obstructive pulmonary disease) ([place])    recent dx--no acute problems  . Crohn's ileocolitis ([place])   . Depression   . ED (erectile dysfunction)   . Emphysema lung ([place]) 2016   Dr. [name]  . Hemorrhoids   . Hiatal hernia 1956   found while in service  . Hypertension 11/11   mild  . Hypertriglyceridemia   . Hypertrophy of prostate    w/o UR obst & oth luts  . Idiopathic pulmonary fibrosis ([place]) 2016   Dr. [name]  . Long term (current) use of anticoagulants 02/21/2011  . Melanoma of scalp ([place])   . Muscle tremor    saw neurology remotely elsewhere, was Rx inderal  . On home oxygen therapy    "2L; 20-24h for the past week" (01/26/2015)  . Osteoarthritis    "maybe in my hands, feet" (01/26/2015)  . Osteopenia    per DEXA 11/2008  . Peripheral neuropathy (HCC)    h/o neg w/u  .  Pneumonia ?2015  . Pulmonary fibrosis ([place]) 2015   Rx Esbriet ~ 10-2013 ([name]), Dr. [name]  . Renal cyst    bilateral  . Serrated adenoma of colon 03/2010  . Squamous cell carcinoma of skin of scalp    tx'd ~ 69yrs; "froze them off"  . Squamous cell carcinoma, face     "froze them off"  . Stroke ([place])    BP 132/73   Pulse 84   SpO2 92%   Opioid Risk Score:   Fall Risk Score:  `1  Depression screen PHQ 2/9  Depression screen [facility] 2/9 02/20/2016 11/07/2015 12/14/2014 01/26/2014 10/24/2013 01/07/2013  Decreased Interest 0 0 0 0 0 0  Down, Depressed, Hopeless 0 0 0 0 0 0  PHQ - 2 Score 0 0 0 0 0 0  Some recent data might be hidden   Review of Systems  Constitutional: Negative.   HENT: Negative.   Eyes: Negative.   Respiratory: Positive for shortness of breath.   Cardiovascular: Negative.   Gastrointestinal: Negative.   Endocrine: Negative.   Genitourinary: Negative.   Musculoskeletal: Negative.   Skin: Negative.   Allergic/Immunologic: Negative.   Neurological: Negative.   Hematological: Negative.   Psychiatric/Behavioral: Negative.   All other systems reviewed and are negative.      Objective:   Physical Exam  Constitutional: He appears well-developed and well-nourished.  HENT:  Head: Normocephalic and atraumatic.  Eyes: Conjunctivae and EOM are normal. Pupils are equal, round, and reactive to light.  Neck: Normal range of motion.  Cardiovascular: Normal rate and normal heart sounds.  An irregularly irregular rhythm present.  Pulmonary/Chest: Effort normal and breath sounds normal. No respiratory distress. He has no wheezes. He has no rales.  Abdominal: Soft. Bowel sounds are normal. He exhibits no distension. There is no tenderness.  PEG site clean and dry  Neurological: He is alert. He exhibits normal muscle tone.  Mixed aphasia, severely impaired Receptive, moderately impaired. Expressive  Motor strength is 4 plus, right deltoid, biceps, triceps, grip, hip flexor,  knee extensor, ankle dorsiflexor 5/5 in the left deltoid, bicep, tricep, hip flexor, knee extensor, dorsi flexor  Orientation/cognition difficult to evaluate secondary to receptive language skills.  Needs cues to follow simple commands such as manual muscle testing  Nursing note and vitals reviewed.         Assessment & Plan:1. Left MCA infarct with residual mild hemiparesis, severe receptive aphasia. No need for PT, speech or OT at this time   2.  Dysphagia improved. He has been discharged from speech therapy greater than 7 months ago. He has been taking medication and food by mouth for several months without evidence of aspiration,according to his wife.  Recommend take all medications by mouth, may crush in applesauce, pudding, or similar Discontinue Jevity feeds through the tube. Continue 3 times daily meals with snacks between meals and at bedtime. If patient consumes less than 50% of a meal he should be drinking one can of Jevity. flush G-tube 30 cc of fluid, 3 times per day  Way patient twice a week and record, bringing in the record of weights to next office visit in 4 weeks. If weight loss is 5 pounds or less. Will send to radiology for tube removal, otherwise we'll need to increase his oral supplemental feedings

## 2016-05-14 DIAGNOSIS — J8410 Pulmonary fibrosis, unspecified: Secondary | ICD-10-CM | POA: Diagnosis not present

## 2016-05-14 DIAGNOSIS — J84112 Idiopathic pulmonary fibrosis: Secondary | ICD-10-CM | POA: Diagnosis not present

## 2016-05-15 DIAGNOSIS — J438 Other emphysema: Secondary | ICD-10-CM | POA: Diagnosis not present

## 2016-05-15 DIAGNOSIS — A319 Mycobacterial infection, unspecified: Secondary | ICD-10-CM | POA: Diagnosis not present

## 2016-05-15 DIAGNOSIS — R0602 Shortness of breath: Secondary | ICD-10-CM | POA: Diagnosis not present

## 2016-05-15 DIAGNOSIS — J849 Interstitial pulmonary disease, unspecified: Secondary | ICD-10-CM | POA: Diagnosis not present

## 2016-05-15 DIAGNOSIS — J9611 Chronic respiratory failure with hypoxia: Secondary | ICD-10-CM | POA: Diagnosis not present

## 2016-05-15 DIAGNOSIS — J84112 Idiopathic pulmonary fibrosis: Secondary | ICD-10-CM | POA: Diagnosis not present

## 2016-05-15 DIAGNOSIS — R0609 Other forms of dyspnea: Secondary | ICD-10-CM | POA: Diagnosis not present

## 2016-05-15 DIAGNOSIS — Z79899 Other long term (current) drug therapy: Secondary | ICD-10-CM | POA: Diagnosis not present

## 2016-05-15 DIAGNOSIS — K5090 Crohn's disease, unspecified, without complications: Secondary | ICD-10-CM | POA: Diagnosis not present

## 2016-05-15 DIAGNOSIS — J479 Bronchiectasis, uncomplicated: Secondary | ICD-10-CM | POA: Diagnosis not present

## 2016-05-15 DIAGNOSIS — Z87891 Personal history of nicotine dependence: Secondary | ICD-10-CM | POA: Diagnosis not present

## 2016-05-16 ENCOUNTER — Encounter: Payer: Self-pay | Admitting: Physical Medicine & Rehabilitation

## 2016-05-16 ENCOUNTER — Encounter: Payer: Medicare HMO | Attending: Physical Medicine & Rehabilitation

## 2016-05-16 ENCOUNTER — Ambulatory Visit (HOSPITAL_BASED_OUTPATIENT_CLINIC_OR_DEPARTMENT_OTHER): Payer: Medicare HMO | Admitting: Physical Medicine & Rehabilitation

## 2016-05-16 VITALS — BP 144/67 | HR 71 | Resp 14

## 2016-05-16 DIAGNOSIS — R1314 Dysphagia, pharyngoesophageal phase: Secondary | ICD-10-CM

## 2016-05-16 DIAGNOSIS — I481 Persistent atrial fibrillation: Secondary | ICD-10-CM | POA: Diagnosis not present

## 2016-05-16 DIAGNOSIS — Z9889 Other specified postprocedural states: Secondary | ICD-10-CM | POA: Insufficient documentation

## 2016-05-16 DIAGNOSIS — Z931 Gastrostomy status: Secondary | ICD-10-CM | POA: Insufficient documentation

## 2016-05-16 DIAGNOSIS — Z800 Family history of malignant neoplasm of digestive organs: Secondary | ICD-10-CM | POA: Insufficient documentation

## 2016-05-16 DIAGNOSIS — K5090 Crohn's disease, unspecified, without complications: Secondary | ICD-10-CM | POA: Diagnosis not present

## 2016-05-16 DIAGNOSIS — Z803 Family history of malignant neoplasm of breast: Secondary | ICD-10-CM | POA: Diagnosis not present

## 2016-05-16 DIAGNOSIS — J449 Chronic obstructive pulmonary disease, unspecified: Secondary | ICD-10-CM | POA: Diagnosis not present

## 2016-05-16 DIAGNOSIS — Z87891 Personal history of nicotine dependence: Secondary | ICD-10-CM | POA: Insufficient documentation

## 2016-05-16 DIAGNOSIS — Z9049 Acquired absence of other specified parts of digestive tract: Secondary | ICD-10-CM | POA: Insufficient documentation

## 2016-05-16 DIAGNOSIS — Z8249 Family history of ischemic heart disease and other diseases of the circulatory system: Secondary | ICD-10-CM | POA: Diagnosis not present

## 2016-05-16 DIAGNOSIS — Z8042 Family history of malignant neoplasm of prostate: Secondary | ICD-10-CM | POA: Insufficient documentation

## 2016-05-16 DIAGNOSIS — Z9981 Dependence on supplemental oxygen: Secondary | ICD-10-CM | POA: Insufficient documentation

## 2016-05-16 DIAGNOSIS — Z823 Family history of stroke: Secondary | ICD-10-CM | POA: Diagnosis not present

## 2016-05-16 DIAGNOSIS — J8410 Pulmonary fibrosis, unspecified: Secondary | ICD-10-CM | POA: Insufficient documentation

## 2016-05-16 DIAGNOSIS — Z8673 Personal history of transient ischemic attack (TIA), and cerebral infarction without residual deficits: Secondary | ICD-10-CM

## 2016-05-16 DIAGNOSIS — I69320 Aphasia following cerebral infarction: Secondary | ICD-10-CM | POA: Diagnosis not present

## 2016-05-16 DIAGNOSIS — I4819 Other persistent atrial fibrillation: Secondary | ICD-10-CM

## 2016-05-16 NOTE — Progress Notes (Signed)
Subjective:    Patient ID: Brian [name], male    DOB: Nov 10, 1928, 83 y.o.   MRN: [id number]  HPI Started swallowing pills by mouth Weight is steady 159-160lb Eating well, chicken, beef, vegetables,eggs, sausage, toast  goes to restaurants Drinks water and milk, OJ and V-8 Drinks 1 jevity per day sometimes too full to finish it  Pain Inventory Average Pain 0 Pain Right Now 0 My pain is no pain  In the last 24 hours, has pain interfered with the following? General activity 0 Relation with others 0 Enjoyment of life 0 What TIME of day is your pain at its worst? no pain Sleep (in general) Good  Pain is worse with: no pain Pain improves with: no pain Relief from Meds: no pain  Mobility walk without assistance ability to climb steps?  yes do you drive?  no  Function retired  Neuro/Psych No problems in this area  Prior Studies Any changes since last visit?  no  Physicians involved in your care Any changes since last visit?  no   Family History  Problem Relation Age of Onset  . Breast cancer Mother   . Stroke Mother 33  . Heart attack Father 16  . Crohn's disease Brother   . Dementia Sister   . Diabetes Sister   . Colon cancer Neg Hx   . Prostate cancer Neg Hx    Social History   Social History  . Marital status: Married    Spouse name: Dalene Seltzer  . Number of children: 4  . Years of education: N/A   Occupational History  . Retired   . RETIRED Retired   Social History Main Topics  . Smoking status: Former Smoker    Packs/day: 1.00    Years: 40.00    Types: Cigarettes, Pipe    Quit date: 03/24/1990  . Smokeless tobacco: Never Used  . Alcohol use No     Comment:    . Drug use: No  . Sexual activity: No   Other Topics Concern  . 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 APPENDECTOMY  1984  . CATARACT  EXTRACTION W/ INTRAOCULAR LENS  IMPLANT, BILATERAL Bilateral 2007  . CHOLECYSTECTOMY  02/17/2011   Procedure: LAPAROSCOPIC CHOLECYSTECTOMY WITH INTRAOPERATIVE CHOLANGIOGRAM;  Surgeon: Edward Jolly, MD;  Location[facility];  Service: General;  Laterality: N/A;  . COLON RESECTION  1984   resection of distal ileum and cecum w/ appendectomy, 18-inch small intestine, for Crohn's disease  . COLON SURGERY    . MOHS SURGERY Right ~ 2014   "side of my scalp"   Past Medical History:  Diagnosis Date  . Anxiety   . Atrial fibrillation with RVR ([place])   . B12 deficiency anemia   . CAP (community acquired pneumonia) 01/26/2015  . COPD (chronic obstructive pulmonary disease) ([place])    recent dx--no acute problems  . Crohn's ileocolitis ([place])   . Depression   . ED (erectile dysfunction)   . Emphysema lung ([place]) 2016   Dr. [name]  . Hemorrhoids   . Hiatal hernia 1956   found while in service  . Hypertension 11/11   mild  . Hypertriglyceridemia   . Hypertrophy of prostate    w/o UR obst & oth luts  . Idiopathic pulmonary fibrosis ([place]) 2016   Dr.  Rackley  . Long term (current) use of anticoagulants 02/21/2011  . Melanoma of scalp ([place])   . Muscle tremor    saw neurology remotely elsewhere, was Rx inderal  . On home oxygen therapy    "2L; 20-24h for the past week" (01/26/2015)  . Osteoarthritis    "maybe in my hands, feet" (01/26/2015)  . Osteopenia    per DEXA 11/2008  . Peripheral neuropathy (HCC)    h/o neg w/u  . Pneumonia ?2015  . Pulmonary fibrosis ([place]) 2015   Rx Esbriet ~ 10-2013 ([name]), Dr. [name]  . Renal cyst    bilateral  . Serrated adenoma of colon 03/2010  . Squamous cell carcinoma of skin of scalp    tx'd ~ 51yrs; "froze them off"  . Squamous cell carcinoma, face     "froze them off"  . Stroke ([place])    BP (!) 144/67   Pulse 71   Resp 14   SpO2 (!) 88%   Opioid Risk Score:   Fall Risk Score:  `1  Depression screen PHQ 2/9  Depression screen [facility] 2/9  02/20/2016 11/07/2015 12/14/2014 01/26/2014 10/24/2013 01/07/2013  Decreased Interest 0 0 0 0 0 0  Down, Depressed, Hopeless 0 0 0 0 0 0  PHQ - 2 Score 0 0 0 0 0 0  Some recent data might be hidden    Review of Systems  Constitutional: Negative.   HENT: Negative.   Eyes: Negative.   Respiratory: Negative.   Cardiovascular: Negative.   Gastrointestinal: Negative.   Endocrine: Negative.   Genitourinary: Negative.   Musculoskeletal: Negative.   Skin: Negative.   Allergic/Immunologic: Negative.   Neurological: Negative.   Hematological: Negative.   Psychiatric/Behavioral: Negative.   All other systems reviewed and are negative.      Objective:   Physical Exam  Constitutional: He is oriented to person, place, and time. He appears well-developed and well-nourished.  HENT:  Head: Normocephalic and atraumatic.  Eyes: Conjunctivae are normal. Pupils are equal, round, and reactive to light.  Pulmonary/Chest: No respiratory distress. He has no wheezes.  Abdominal: Soft. Bowel sounds are normal. He exhibits no distension. There is no tenderness.  PEG site clean and dry, no drainage, nontender  Neurological: He is alert and oriented to person, place, and time.  Psychiatric: He has a normal mood and affect.  Nursing note and vitals reviewed.         Assessment & Plan:  1. Dysphagia, status post stroke, resolved. Now able to take normal consistencies by mouth. He has maintained his weight for 1 month without G-tube feeds. He is able to swallow his pills by mouth. Will refer to interventional radiology for G-tube removal. Patient is on Eliquis which will likely need to be held.  No physical medicine and rehabilitation follow-up needed. Follow-up with neurology. Follow-up with PCP Discussed with patient and wife

## 2016-05-16 NOTE — Patient Instructions (Signed)
You should expect a call from radiology to schedule the tube removal. You will likely need to stop the Ellik was for a day or 2 prior to the tube removal

## 2016-05-22 ENCOUNTER — Other Ambulatory Visit: Payer: Self-pay | Admitting: Physical Medicine & Rehabilitation

## 2016-05-22 DIAGNOSIS — R1314 Dysphagia, pharyngoesophageal phase: Secondary | ICD-10-CM

## 2016-05-23 ENCOUNTER — Telehealth: Payer: Self-pay | Admitting: Internal Medicine

## 2016-05-23 NOTE — Telephone Encounter (Signed)
Caller name: Dalene Seltzer Relationship to patient: Wife Can be reached:[phone] Pharmacy:  Reason for call: FYI: Wife called to inform provider that patients feeding tube will come out next week and he is on solid food and can swallow his medications.

## 2016-05-23 NOTE — Telephone Encounter (Signed)
thx

## 2016-05-23 NOTE — Telephone Encounter (Signed)
FYI

## 2016-05-27 ENCOUNTER — Other Ambulatory Visit: Payer: Self-pay | Admitting: Gastroenterology

## 2016-05-29 ENCOUNTER — Encounter (HOSPITAL_COMMUNITY): Payer: Self-pay | Admitting: Interventional Radiology

## 2016-05-29 ENCOUNTER — Ambulatory Visit (HOSPITAL_COMMUNITY)
Admission: RE | Admit: 2016-05-29 | Discharge: 2016-05-29 | Disposition: A | Discharge status: Home / Self Care | Payer: Medicare HMO | Source: Ambulatory Visit | Attending: Physical Medicine & Rehabilitation | Admitting: Physical Medicine & Rehabilitation

## 2016-05-29 DIAGNOSIS — Z431 Encounter for attention to gastrostomy: Secondary | ICD-10-CM | POA: Insufficient documentation

## 2016-05-29 DIAGNOSIS — Z4682 Encounter for fitting and adjustment of non-vascular catheter: Secondary | ICD-10-CM | POA: Diagnosis not present

## 2016-05-29 DIAGNOSIS — R1314 Dysphagia, pharyngoesophageal phase: Secondary | ICD-10-CM

## 2016-05-29 HISTORY — PX: IR GENERIC HISTORICAL: IMG1180011

## 2016-05-29 MED ORDER — LIDOCAINE VISCOUS 2 % MT SOLN
OROMUCOSAL | Status: AC
  Filled 2016-05-29: qty 15

## 2016-05-29 NOTE — Procedures (Signed)
Successful bedside removal of intact pull through G-tube. No immediate post procedural complications.  Jay Tennelle Taflinger, MD Pager #: [phone]  

## 2016-06-08 ENCOUNTER — Other Ambulatory Visit: Payer: Self-pay | Admitting: Cardiology

## 2016-06-09 ENCOUNTER — Telehealth: Payer: Self-pay | Admitting: Internal Medicine

## 2016-06-09 NOTE — Telephone Encounter (Signed)
Called patient to schedule awv. Spoke with patient's wife, she stated that he will like to give office a call back to schedule appt.

## 2016-06-10 NOTE — Telephone Encounter (Signed)
Age 83 Wt 72.5kg (05/15/2016) Saw Dr Aundra Dubin 03/12/2015 spoke with pt's wife and instructed that he needs appt with Dr Aundra Dubin as soon as possible and gave her the number to call and schedule appt with Dr Aundra Dubin and she states she will do so 03/06/2016 Hgb 14.2 HCT 41.7 SrCr 0.79 Refill of Eliquis 5mg  q 12 hours done as requested Feeding tube removed Feb 23rd 2018

## 2016-06-11 DIAGNOSIS — J8410 Pulmonary fibrosis, unspecified: Secondary | ICD-10-CM | POA: Diagnosis not present

## 2016-06-11 DIAGNOSIS — J84112 Idiopathic pulmonary fibrosis: Secondary | ICD-10-CM | POA: Diagnosis not present

## 2016-06-17 NOTE — Progress Notes (Signed)
Cardiology Office Note   Date:  06/20/2016   ID:  [name], DOB 08/13/1928, MRN [id number]  PCP:  Brian November, Brian  Cardiologist:   Brian Rouge, Brian   Chief Complaint  Patient presents with  . Atrial Fibrillation  . Medication Management      History of Present Illness: Brian [name] is a 83 y.o. male who presents for evaluation of afib. Previous patient of Brian [name]. Notes Long term chronic afib beginning before 2016.  Stroke [name] 2016. Changed from coumadin to eliquis at that time Needed PEG tube which was recently removed and some right sided weakness. Has ILD seen at [name] and by Brian Brian [name] Rx with pirfenidone and home oxygen  Reviewed last echo done 01/31/15 EF 55-60% MAC with mild MR Mild RAE estimated PA 34 mmHg   He is a big [place] fan 3 children son lives here one in [place] one in [place]. Goes to [name] 3 x/week. Lots of hobbies including [place] working. Goes to Brian [name] for his ILD no longer seeing Brian [name] or Brian [name]  Past Medical History:  Diagnosis Date  . Anxiety   . Atrial fibrillation with RVR ([place])   . B12 deficiency anemia   . CAP (community acquired pneumonia) 01/26/2015  . COPD (chronic obstructive pulmonary disease) ([place])    recent dx--no acute problems  . Crohn's ileocolitis ([place])   . Depression   . ED (erectile dysfunction)   . Emphysema lung ([place]) 2016   Brian. Dorothyann [name]  . Hemorrhoids   . Hiatal hernia 1956   found while in service  . Hypertension 11/11   mild  . Hypertriglyceridemia   . Hypertrophy of prostate    w/o UR obst & oth luts  . Idiopathic pulmonary fibrosis ([place]) 2016   Brian. Dorothyann [name]  . Long term (current) use of anticoagulants 02/21/2011  . Melanoma of scalp ([place])   . Muscle tremor    saw neurology remotely elsewhere, was Rx inderal  . On home oxygen therapy    "2L; 20-24h for the past week" (01/26/2015)  . Osteoarthritis    "maybe in my hands, feet" (01/26/2015)  . Osteopenia    per DEXA 11/2008  . Peripheral neuropathy  (HCC)    h/o neg w/u  . Pneumonia ?2015  . Pulmonary fibrosis ([place]) 2015   Rx Esbriet ~ 10-2013 (Brian [name]), Brian. Dorothyann [name]  . Renal cyst    bilateral  . Serrated adenoma of colon 03/2010  . Squamous cell carcinoma of skin of scalp    tx'd ~ 30yr; "froze them off"  . Squamous cell carcinoma, face     "froze them off"  . Stroke ([name]     Past Surgical History:  Procedure Laterality Date  . APPENDECTOMY  1984  . CATARACT EXTRACTION W/ INTRAOCULAR LENS  IMPLANT, BILATERAL Bilateral 2007  . CHOLECYSTECTOMY  02/17/2011   Procedure: LAPAROSCOPIC CHOLECYSTECTOMY WITH INTRAOPERATIVE CHOLANGIOGRAM;  Surgeon: BEdward Jolly Brian;  Location: [facility];  Service: General;  Laterality: N/A;  . COLON RESECTION  1984   resection of distal ileum and cecum w/ appendectomy, 18-inch small intestine, for Crohn's disease  . COLON SURGERY    . IR GENERIC HISTORICAL  05/29/2016   IR GASTROSTOMY TUBE REMOVAL 05/29/2016 Brian [name] Brian [name]  . MOHS SURGERY Right ~ 2014   "side of my scalp"     Current Outpatient Prescriptions  Medication Sig Dispense Refill  . apixaban (ELIQUIS) 5 MG TABS tablet Take 1 tablet (  5 mg total) by mouth 2 (two) times daily. 180 tablet 3  . busPIRone (BUSPAR) 15 MG tablet PLACE 1 TABLET INTO FEEDING TUBE TWICE A DAY 180 tablet 1  . Cyanocobalamin (NASCOBAL) 500 MCG/0.1ML SOLN Use 1 spray nasally per week as directed. 3 Bottle 2  . ergocalciferol (DRISDOL) 8000 UNIT/ML drops Place 1 mL (8,000 Units total) into feeding tube 3 (three) times a week. 60 mL 1  . fluticasone (FLONASE) 50 MCG/ACT nasal spray Place into the nose. Place 2 sprays into both nostrils continuously as needed for Rhinitis.    Marland Kitchen gemfibrozil (LOPID) 600 MG tablet Take 1 tablet (600 mg total) by mouth 2 (two) times daily. 180 tablet 1  . hydrocortisone 2.5 % cream Apply topically 2 (two) times daily. 30 g 0  . loperamide (IMODIUM) 1 MG/5ML solution Place 10 mLs (2 mg total) into feeding tube as needed for diarrhea  or loose stools. 120 mL 1  . Mesalamine (PENTASA PO) Give 1 capsule by tube 2 (two) times daily.     . Multiple Vitamin (DAILY VITAMIN PO) Take by mouth.    . nebivolol (BYSTOLIC) 5 MG tablet Take 1 tablet (5 mg total) by mouth daily. 90 tablet 3  . OXYGEN Inhale into the lungs continuous. 2 L    . PENTASA 250 MG CR capsule TAKE 2 CAPSULES TWICE A DAY 360 capsule 0  . Pirfenidone 267 MG CAPS Take 3 capsules by mouth 3 (three) times daily. Put in feeding tube 270 capsule   . pravastatin (PRAVACHOL) 10 MG tablet Take 1 tablet into feeding tube daily 90 tablet 1   No current facility-administered medications for this visit.     Allergies:   Patient has no known allergies.    Social History:  The patient  reports that he quit smoking about 26 years ago. His smoking use included Cigarettes and Pipe. He has a 40.00 pack-year smoking history. He has never used smokeless tobacco. He reports that he does not drink alcohol or use drugs.   Family History:  The patient's family history includes Breast cancer in his mother; Crohn's disease in his brother; Dementia in his sister; Diabetes in his sister; Heart attack (age of onset: 22) in his father; Stroke (age of onset: 5) in his mother.    ROS:  Please see the history of present illness.   Otherwise, review of systems are positive for none.   All other systems are reviewed and negative.    PHYSICAL EXAM: VS:  BP (!) 110/56   Pulse 80   Ht _0  (1.778 m)   Wt 154 lb 12.8 oz (70.2 kg)   SpO2 94%   BMI 22.21 kg/m  , BMI Body mass index is 22.21 kg/m. Affect appropriate Thin chronically ill white male  HEENT: head bobbing tremor  Neck supple with no adenopathy JVP normal no bruits no thyromegaly Lungs clear with no wheezing and good diaphragmatic motion Heart:  S1/S2 no murmur, no rub, gallop or click PMI normal Abdomen: benighn, BS positve, no tenderness, no AAA Peg tube scar  no bruit.  No HSM or HJR Distal pulses intact with no  bruits No edema Neuro non-focal Skin warm and dry Mild spasticity and RUE weakness post CVA     EKG:  05/05/15 afib rate 80 poor R wave progression ? Old anterior MI  06/20/16  Afib rate 65 nonspecific ST changes   Recent Labs: 02/20/2016: ALT 13 03/06/2016: BUN 22; Creat 0.79; Hemoglobin 14.2; Platelets 215;  Potassium 4.2; Sodium 136    Lipid Panel    Component Value Date/Time   CHOL 165 02/20/2016 1403   TRIG 87.0 02/20/2016 1403   HDL 67.70 02/20/2016 1403   CHOLHDL 2 02/20/2016 1403   VLDL 17.4 02/20/2016 1403   LDLCALC 80 02/20/2016 1403      Wt Readings from Last 3 Encounters:  06/20/16 154 lb 12.8 oz (70.2 kg)  02/20/16 161 lb 8 oz (73.3 kg)  01/23/16 160 lb 6.4 oz (72.8 kg)      Other studies Reviewed: Additional studies/ records that were reviewed today include: old cardiology notes Brian [name] notes primary Brian Larose [facility] and Pulmonary Brian Brian [name], ECG labs and echo 2016 .    ASSESSMENT AND PLAN:  1.  Afib: chronic good rate control and anticoagulaiton with NOAC 2. CVA coumadin failure recovered on eliquis now  3. GI/PEG removed taking PO weight up  4. ILD baseline 2L goes up to 5 L with exertion f/u Brian [name] on Pirfenidone 5. Cholesterol   Cholesterol is at goal.  Continue current dose of statin and diet Rx.  No myalgias or side effects.  F/U  LFT's in 6 months. Lab Results  Component Value Date   LDLCALC 80 02/20/2016               Current medicines are reviewed at length with the patient today.  The patient does not have concerns regarding medicines.  The following changes have been made:  no change  Labs/ tests ordered today include: none   Orders Placed This Encounter  Procedures  . EKG 12-Lead     Disposition:   FU with me in a year      Signed, Brian Rouge, Brian  06/20/2016 10:32 AM    [facility] [name], [place], [place]  [address] Phone: [phone]; Fax: [phone]

## 2016-06-19 ENCOUNTER — Other Ambulatory Visit: Payer: Self-pay | Admitting: Internal Medicine

## 2016-06-20 ENCOUNTER — Encounter: Payer: Self-pay | Admitting: Cardiovascular Disease

## 2016-06-20 ENCOUNTER — Encounter (INDEPENDENT_AMBULATORY_CARE_PROVIDER_SITE_OTHER): Payer: Self-pay

## 2016-06-20 ENCOUNTER — Ambulatory Visit (INDEPENDENT_AMBULATORY_CARE_PROVIDER_SITE_OTHER): Payer: Medicare HMO | Admitting: Cardiovascular Disease

## 2016-06-20 VITALS — BP 110/56 | HR 80 | Ht 70.0 in | Wt 154.8 lb

## 2016-06-20 DIAGNOSIS — I482 Chronic atrial fibrillation, unspecified: Secondary | ICD-10-CM

## 2016-06-20 DIAGNOSIS — Z5181 Encounter for therapeutic drug level monitoring: Secondary | ICD-10-CM

## 2016-06-20 DIAGNOSIS — I4891 Unspecified atrial fibrillation: Secondary | ICD-10-CM

## 2016-06-20 MED ORDER — APIXABAN 5 MG PO TABS: 5.0000 mg | ORAL_TABLET | Freq: Two times a day (BID) | ORAL | 3 refills | Status: DC

## 2016-06-20 MED ORDER — NEBIVOLOL HCL 5 MG PO TABS: 5.0000 mg | ORAL_TABLET | Freq: Every day | ORAL | 3 refills | Status: DC

## 2016-06-20 NOTE — Patient Instructions (Addendum)

## 2016-06-25 ENCOUNTER — Ambulatory Visit (INDEPENDENT_AMBULATORY_CARE_PROVIDER_SITE_OTHER): Payer: Medicare HMO | Admitting: Internal Medicine

## 2016-06-25 ENCOUNTER — Encounter: Payer: Self-pay | Admitting: Internal Medicine

## 2016-06-25 VITALS — BP 126/74 | HR 82 | Temp 98.4°F | Resp 16 | Ht 70.0 in | Wt 152.1 lb

## 2016-06-25 DIAGNOSIS — I10 Essential (primary) hypertension: Secondary | ICD-10-CM

## 2016-06-25 DIAGNOSIS — Z0000 Encounter for general adult medical examination without abnormal findings: Secondary | ICD-10-CM

## 2016-06-25 DIAGNOSIS — K50819 Crohn's disease of both small and large intestine with unspecified complications: Secondary | ICD-10-CM

## 2016-06-25 DIAGNOSIS — E538 Deficiency of other specified B group vitamins: Secondary | ICD-10-CM | POA: Diagnosis not present

## 2016-06-25 DIAGNOSIS — R69 Illness, unspecified: Secondary | ICD-10-CM | POA: Diagnosis not present

## 2016-06-25 DIAGNOSIS — F411 Generalized anxiety disorder: Secondary | ICD-10-CM | POA: Diagnosis not present

## 2016-06-25 MED ORDER — CYANOCOBALAMIN 500 MCG/0.1ML NA SOLN: NASAL | 2 refills | Status: DC

## 2016-06-25 NOTE — Progress Notes (Signed)
Subjective:   Brian [name] is a 83 y.o. male who presents for Medicare Annual/Subsequent preventive examination. Here with wife.  Review of Systems:  No ROS.  Medicare Wellness Visit. Cardiac Risk Factors include: advanced age (>20men, >29 women);dyslipidemia Sleep patterns:  Sleeps 7-10 hrs. Feels rested.   Home Safety/Smoke Alarms:  Feels safe in home. Smoke alarms in place.  Living environment; residence and Firearm Safety: Lives with wife. Guns safely stored. Seat Belt Safety/Bike Helmet: Wears seat belt.   Counseling:   Eye Exam- Wears glasses-Dr.Hecker annually.  Dental- Dr.Poole every 6 months   Male:   CCS-  Last 04/03/10: no result on file   PSA-  Lab Results  Component Value Date   PSA 0.74 01/07/2013   PSA 0.89 12/23/2010   PSA 0.49 12/10/2009        Objective:    Vitals: BP 126/74 (BP Location: Left Arm, Patient Position: Sitting, Cuff Size: Small)   Pulse 82   Temp 98.4 F (36.9 C) (Oral)   Resp 16   Ht 5\' 10"  (1.778 m)   Wt 152 lb 2 oz (69 kg)   SpO2 92%   BMI 21.83 kg/m   Body mass index is 21.83 kg/m.  Tobacco History  Smoking Status  . Former Smoker  . Packs/day: 1.00  . Years: 40.00  . Types: Cigarettes, Pipe  . Quit date: 03/24/1990  Smokeless Tobacco  . Never Used     Counseling given: No   Past Medical History:  Diagnosis Date  . Anxiety   . Atrial fibrillation with RVR ([place])   . B12 deficiency anemia   . CAP (community acquired pneumonia) 01/26/2015  . COPD (chronic obstructive pulmonary disease) ([place])    recent dx--no acute problems  . Crohn's ileocolitis ([place])   . Depression   . ED (erectile dysfunction)   . Emphysema lung ([place]) 2016   Dr. [name]  . Hemorrhoids   . Hiatal hernia 1956   found while in service  . Hypertension 11/11   mild  . Hypertriglyceridemia   . Hypertrophy of prostate    w/o UR obst & oth luts  . Idiopathic pulmonary fibrosis ([place]) 2016   Dr. [name]  . Long term (current) use of  anticoagulants 02/21/2011  . Melanoma of scalp ([place])   . Muscle tremor    saw neurology remotely elsewhere, was Rx inderal  . On home oxygen therapy    "2L; 20-24h for the past week" (01/26/2015)  . Osteoarthritis    "maybe in my hands, feet" (01/26/2015)  . Osteopenia    per DEXA 11/2008  . Peripheral neuropathy (HCC)    h/o neg w/u  . Pneumonia ?2015  . Pulmonary fibrosis ([place]) 2015   Rx Esbriet ~ 10-2013 ([name]), Dr. [name]  . Renal cyst    bilateral  . Serrated adenoma of colon 03/2010  . Squamous cell carcinoma of skin of scalp    tx'd ~ 73yrs; "froze them off"  . Squamous cell carcinoma, face     "froze them off"  . Stroke [facility])    Past Surgical History:  Procedure Laterality Date  . APPENDECTOMY  1984  . CATARACT EXTRACTION W/ INTRAOCULAR LENS  IMPLANT, BILATERAL Bilateral 2007  . CHOLECYSTECTOMY  02/17/2011   Procedure: LAPAROSCOPIC CHOLECYSTECTOMY WITH INTRAOPERATIVE CHOLANGIOGRAM;  Surgeon: Edward Jolly, MD;  Location: [facility];  Service: General;  Laterality: N/A;  . COLON RESECTION  1984   resection of distal ileum and cecum w/ appendectomy, 18-inch small  intestine, for Crohn's disease  . COLON SURGERY    . IR GENERIC HISTORICAL  05/29/2016   IR GASTROSTOMY TUBE REMOVAL 05/29/2016 Sandi Mariscal, MD [facility]  . MOHS SURGERY Right ~ 2014   "side of my scalp"   Family History  Problem Relation Age of Onset  . Breast cancer Mother   . Stroke Mother 11  . Heart attack Father 42  . Crohn's disease Brother   . Dementia Sister   . Diabetes Sister   . Colon cancer Neg Hx   . Prostate cancer Neg Hx    History  Sexual Activity  . Sexual activity: No    Outpatient Encounter Prescriptions as of 06/25/2016  Medication Sig  . apixaban (ELIQUIS) 5 MG TABS tablet Take 1 tablet (5 mg total) by mouth 2 (two) times daily.  . busPIRone (BUSPAR) 15 MG tablet PLACE 1 TABLET INTO FEEDING TUBE TWICE A DAY (Patient taking differently: TAKE 1 TABLET ORAL TWICE A DAY)  .  Cyanocobalamin (NASCOBAL) 500 MCG/0.1ML SOLN Use 1 spray nasally per week as directed.  . ergocalciferol (DRISDOL) 8000 UNIT/ML drops Place 1 mL (8,000 Units total) into feeding tube 3 (three) times a week. (Patient taking differently: Take 8,000 Units by mouth 3 (three) times a week. )  . fluticasone (FLONASE) 50 MCG/ACT nasal spray Place into the nose. Place 2 sprays into both nostrils continuously as needed for Rhinitis.  Marland Kitchen gemfibrozil (LOPID) 600 MG tablet Take 1 tablet (600 mg total) by mouth 2 (two) times daily.  . hydrocortisone 2.5 % cream Apply topically 2 (two) times daily.  Marland Kitchen loperamide (IMODIUM) 1 MG/5ML solution Place 10 mLs (2 mg total) into feeding tube as needed for diarrhea or loose stools. (Patient taking differently: Take 2 mg by mouth as needed for diarrhea or loose stools. )  . mesalamine (PENTASA) 250 MG CR capsule Take 2 capsules (500 mg total) by mouth 2 (two) times daily. TAKE 1 CAPSULE BY MOUTH TWICE DAILY  . Multiple Vitamin (DAILY VITAMIN PO) Take by mouth.  . nebivolol (BYSTOLIC) 5 MG tablet Take 1 tablet (5 mg total) by mouth daily.  . OXYGEN Inhale into the lungs continuous. 2 L  . Pirfenidone 267 MG CAPS Take 3 capsules by mouth 3 (three) times daily. Put in feeding tube  . pravastatin (PRAVACHOL) 10 MG tablet Take 1 tablet into feeding tube daily (Patient taking differently: Take 10 mg by mouth daily. )  . [DISCONTINUED] Cyanocobalamin (NASCOBAL) 500 MCG/0.1ML SOLN Use 1 spray nasally per week as directed.  . [DISCONTINUED] PENTASA 250 MG CR capsule TAKE 2 CAPSULES TWICE A DAY  . [DISCONTINUED] Mesalamine (PENTASA PO) Give 1 capsule by tube 2 (two) times daily.    No facility-administered encounter medications on file as of 06/25/2016.     Activities of Daily Living In your present state of health, do you have any difficulty performing the following activities: 06/25/2016 02/20/2016  Hearing? N N  Vision? N N  Difficulty concentrating or making decisions? N N    Walking or climbing stairs? Y Y  Dressing or bathing? N N  Doing errands, shopping? N Y  Some recent data might be hidden    Patient Care Team: Colon Branch, MD as PCP - General Tanda Rockers, MD as Consulting Physician (Pulmonary Disease) Ladene Artist, MD as Consulting Physician (Gastroenterology) Larey Dresser, MD as Consulting Physician (Cardiology) Donnald Garre, MD as Referring Physician (Internal Medicine) Monna Fam, MD as Consulting Physician (Ophthalmology)  Assessment:    Physical assessment deferred to PCP.  Exercise Activities and Dietary recommendations Current Exercise Habits: Structured exercise class, Type of exercise: treadmill;walking;strength training/weights, Time (Minutes): > 60, Frequency (Times/Week): 3, Weekly Exercise (Minutes/Week): 0   Diet (meal preparation, eat out, water intake, caffeinated beverages, dairy products, fruits and vegetables): in general, a "healthy" diet  , well balanced States he drinks a lot of water.     Goals      Patient Stated   . <enter goal here> (pt-stated)          Maintain current lifestyle.      Fall Risk Fall Risk  06/25/2016 05/16/2016 02/20/2016 11/07/2015 07/23/2015  Falls in the past year? No No No No No   Depression Screen PHQ 2/9 Scores 06/25/2016 02/20/2016 11/07/2015 12/14/2014  PHQ - 2 Score 0 0 0 0    Cognitive Function Unable to perform MMS exam        Immunization History  Administered Date(s) Administered  . Influenza Split 12/15/2013  . Influenza Whole 12/23/1998, 01/07/2010, 01/28/2011  . Influenza, High Dose Seasonal PF 01/04/2013, 01/04/2013  . Influenza-Unspecified 01/05/2015, 11/25/2015  . Pneumococcal Conjugate-13 11/07/2015  . Pneumococcal Polysaccharide-23 06/27/2002, 12/02/2007  . Td 08/16/1998, 12/06/2008  . Tdap 12/23/2010  . Zoster 02/07/2012   Screening Tests Health Maintenance  Topic Date Due  . INFLUENZA VACCINE  10/22/2016  . TETANUS/TDAP  12/22/2020  . PNA vac  Low Risk Adult  Completed      Plan:     Follow up with Dr.Paz as directed.  Continue to eat heart healthy diet (full of fruits, vegetables, whole grains, lean protein, water--limit salt, fat, and sugar intake) and increase physical activity as tolerated.  Continue doing brain stimulating activities (puzzles, reading, adult coloring books, staying active) to keep memory sharp.   During the course of the visit the patient was educated and counseled about the following appropriate screening and preventive services:   Vaccines to include Pneumoccal, Influenza, Td, HCV  Cardiovascular Disease  Colorectal cancer screening  Diabetes screening  Prostate Cancer Screening  Glaucoma screening  Nutrition counseling   Patient Instructions (the written plan) was given to the patient.    [name] [place], [place]  06/25/2016 [name], MD

## 2016-06-25 NOTE — Patient Instructions (Addendum)
  GO TO THE FRONT DESK Schedule your next appointment for a  Check up in 4 months    Follow up with Dr.Haniyah Maciolek as directed.  Continue to eat heart healthy diet (full of fruits, vegetables, whole grains, lean protein, water--limit salt, fat, and sugar intake) and increase physical activity as tolerated.  Continue doing brain stimulating activities (puzzles, reading, adult coloring books, staying active) to keep memory sharp.

## 2016-06-25 NOTE — Progress Notes (Signed)
Pre visit review using our clinic review tool, if applicable. No additional management support is needed unless otherwise documented below in the visit note. 

## 2016-06-25 NOTE — Progress Notes (Signed)
Subjective:    Patient ID: Brian [name], male    DOB: [birth year], 83 y.o.   MRN: [id number]  DOS:  06/25/2016 Type of visit - description : rov, here with his wife Interval history: Since the last time he was here, he saw cardiology and rehabilitation medicine, he was felt to be stable, Peg was removed few weeks ago, he is doing great. Good by mouth tolerance. Labs and medications reviewed.   Review of Systems anxiety and depression well-controlled No choking or cough No nausea, vomiting. No blood in the stools. No gross hematuria. He has Crohn's, minimal diarrhea on and off.  Past Medical History:  Diagnosis Date  . Anxiety   . Atrial fibrillation with RVR ([place])   . B12 deficiency anemia   . CAP (community acquired pneumonia) 01/26/2015  . COPD (chronic obstructive pulmonary disease) ([place])    recent dx--no acute problems  . Crohn's ileocolitis ([place])   . Depression   . ED (erectile dysfunction)   . Emphysema lung ([place]) 2016   Dr. [name]  . Hemorrhoids   . Hiatal hernia 1956   found while in service  . Hypertension 11/11   mild  . Hypertriglyceridemia   . Hypertrophy of prostate    w/o UR obst & oth luts  . Idiopathic pulmonary fibrosis ([place]) 2016   Dr. [name]  . Long term (current) use of anticoagulants 02/21/2011  . Melanoma of scalp ([place])   . Muscle tremor    saw neurology remotely elsewhere, was Rx inderal  . On home oxygen therapy    "2L; 20-24h for the past week" (01/26/2015)  . Osteoarthritis    "maybe in my hands, feet" (01/26/2015)  . Osteopenia    per DEXA 11/2008  . Peripheral neuropathy (HCC)    h/o neg w/u  . Pneumonia ?2015  . Pulmonary fibrosis ([place]) 2015   Rx Esbriet ~ 10-2013 ([name]), Dr. [name]  . Renal cyst    bilateral  . Serrated adenoma of colon 03/2010  . Squamous cell carcinoma of skin of scalp    tx'd ~ 50yrs; "froze them off"  . Squamous cell carcinoma, face     "froze them off"  . Stroke [facility])     Past Surgical History:    Procedure Laterality Date  . APPENDECTOMY  1984  . CATARACT EXTRACTION W/ INTRAOCULAR LENS  IMPLANT, BILATERAL Bilateral 2007  . CHOLECYSTECTOMY  02/17/2011   Procedure: LAPAROSCOPIC CHOLECYSTECTOMY WITH INTRAOPERATIVE CHOLANGIOGRAM;  Surgeon: Edward Jolly, MD;  Location: [facility];  Service: General;  Laterality: N/A;  . COLON RESECTION  1984   resection of distal ileum and cecum w/ appendectomy, 18-inch small intestine, for Crohn's disease  . COLON SURGERY    . IR GENERIC HISTORICAL  05/29/2016   IR GASTROSTOMY TUBE REMOVAL 05/29/2016 Sandi Mariscal, MD [facility]  . MOHS SURGERY Right ~ 2014   "side of my scalp"    Social History   Social History  . Marital status: Married    Spouse name: Dalene Seltzer  . Number of children: 4  . Years of education: N/A   Occupational History  . Retired   . RETIRED Retired   Social History Main Topics  . Smoking status: Former Smoker    Packs/day: 1.00    Years: 40.00    Types: Cigarettes, Pipe    Quit date: 03/24/1990  . Smokeless tobacco: Never Used  . Alcohol use No     Comment:    . Drug use: No  .  Sexual activity: No   Other Topics Concern  . Not on file   Social History Narrative   Married, lives w/ wife; son [name] is a Restaurant manager, fast food, lives in [place], checks on [place] frequently     Wife drives       Allergies as of 06/25/2016   No Known Allergies     Medication List       Accurate as of 06/25/16 11:59 PM. Always use your most recent med list.          apixaban 5 MG Tabs tablet Commonly known as:  ELIQUIS Take 1 tablet (5 mg total) by mouth 2 (two) times daily.   busPIRone 15 MG tablet Commonly known as:  BUSPAR PLACE 1 TABLET INTO FEEDING TUBE TWICE A DAY   Cyanocobalamin 500 MCG/0.1ML Soln Commonly known as:  NASCOBAL Use 1 spray nasally per week as directed.   DAILY VITAMIN PO Take by mouth.   ergocalciferol 8000 UNIT/ML drops Commonly known as:  DRISDOL Place 1 mL (8,000 Units total) into feeding tube 3 (three)  times a week.   fluticasone 50 MCG/ACT nasal spray Commonly known as:  FLONASE Place into the nose. Place 2 sprays into both nostrils continuously as needed for Rhinitis.   gemfibrozil 600 MG tablet Commonly known as:  LOPID Take 1 tablet (600 mg total) by mouth 2 (two) times daily.   hydrocortisone 2.5 % cream Apply topically 2 (two) times daily.   loperamide 1 MG/5ML solution Commonly known as:  IMODIUM Place 10 mLs (2 mg total) into feeding tube as needed for diarrhea or loose stools.   nebivolol 5 MG tablet Commonly known as:  BYSTOLIC Take 1 tablet (5 mg total) by mouth daily.   OXYGEN Inhale into the lungs continuous. 2 L   PENTASA 250 MG CR capsule Generic drug:  mesalamine Take 2 capsules (500 mg total) by mouth 2 (two) times daily. TAKE 1 CAPSULE BY MOUTH TWICE DAILY   Pirfenidone 267 MG Caps Take 3 capsules by mouth 3 (three) times daily. Put in feeding tube   pravastatin 10 MG tablet Commonly known as:  PRAVACHOL Take 1 tablet into feeding tube daily          Objective:   Physical Exam BP 126/74 (BP Location: Left Arm, Patient Position: Sitting, Cuff Size: Small)   Pulse 82   Temp 98.4 F (36.9 C) (Oral)   Resp 16   Ht 5\' 10"  (1.778 m)   Wt 152 lb 2 oz (69 kg)   SpO2 92%   BMI 21.83 kg/m  General:   Well developed, well nourished . NAD.  HEENT:  Normocephalic . Face symmetric, atraumatic Lungs:  Decreased sounds but clear Normal respiratory effort, no intercostal retractions, no accessory muscle use. Heart: Irregularly irregular.  No pretibial edema bilaterally  Skin: Not pale. Not jaundice Neurologic:  alert & pleasant  Speech impairment, gait appropriate for age and unassisted Psych--  Cognition and judgment appear intact.  Cooperative with normal attention span and concentration.  Behavior appropriate. No anxious or depressed appearing.      Assessment & Plan:   Assessment   HTN Hypertriglyceridemia Anxiety, on chronic Buspar ,  sx controlled  Depression 01-2015 after lost a brother  Atrial fibrillation anticoagulated CVA 01-2015: Dysphagia, aphasia, R weakness,  PANDA d/c 05-2016  Pulmonary: --Pulmonary fibrosis, home oxygen (2 lt, 4 lt w/ exertion     --pneumonia 01-2015:  --Emphysema Tremors Crohn's colitis DJD Osteopenia DEXA 2010, DEXA 03-2014 T score -2.2,  on calcium and vitamin D BPH Peripheral  neuropathy previous workup negative Skin cancer SCC B12 deficiency -- nascobal  H/o palpable abdominal aorta --- CT of the abdomen 06/2013 no AAA   PLAN: HTN: BP very good, on nebivolol Anxiety: Well-controlled on BuSpar Atrial fibrillation, saw cardiology 06/20/2016, felt to be a stable , on eliquis Dysphagia, post stroke: saw rehab, PEG removed few weeks ago, doing well  Vitamin B and D deficiency: On supplements, last levels okay, refill Nascobal Crohn disease: On Pentasa 1 tablet twice a day, used to be 2 tablets bid but he is doing well. No change, we'll see GI this year. Pulmonary fibrosis: Recently his oxygen was increased, he is using between 4 and 6 Lts of O2 depending on activity. No labs needed today.  Medicare wellness today RTC 4 months

## 2016-06-26 NOTE — Assessment & Plan Note (Signed)
HTN: BP very good, on nebivolol Anxiety: Well-controlled on BuSpar Atrial fibrillation, saw cardiology 06/20/2016, felt to be a stable , on eliquis Dysphagia, post stroke: saw rehab, PEG removed few weeks ago, doing well  Vitamin B and D deficiency: On supplements, last levels okay, refill Nascobal Crohn disease: On Pentasa 1 tablet twice a day, used to be 2 tablets bid but he is doing well. No change, we'll see GI this year. Pulmonary fibrosis: Recently his oxygen was increased, he is using between 4 and 6 Lts of O2 depending on activity. No labs needed today.  Medicare wellness today RTC 4 months

## 2016-07-05 ENCOUNTER — Other Ambulatory Visit: Payer: Self-pay | Admitting: Internal Medicine

## 2016-07-12 DIAGNOSIS — J8410 Pulmonary fibrosis, unspecified: Secondary | ICD-10-CM | POA: Diagnosis not present

## 2016-07-12 DIAGNOSIS — J84112 Idiopathic pulmonary fibrosis: Secondary | ICD-10-CM | POA: Diagnosis not present

## 2016-08-05 ENCOUNTER — Ambulatory Visit (INDEPENDENT_AMBULATORY_CARE_PROVIDER_SITE_OTHER): Payer: Medicare HMO | Admitting: Neurology

## 2016-08-05 ENCOUNTER — Encounter: Payer: Self-pay | Admitting: Neurology

## 2016-08-05 VITALS — BP 131/75 | HR 69 | Wt 147.4 lb

## 2016-08-05 DIAGNOSIS — R1314 Dysphagia, pharyngoesophageal phase: Secondary | ICD-10-CM

## 2016-08-05 DIAGNOSIS — I69320 Aphasia following cerebral infarction: Secondary | ICD-10-CM

## 2016-08-05 DIAGNOSIS — Z7901 Long term (current) use of anticoagulants: Secondary | ICD-10-CM | POA: Diagnosis not present

## 2016-08-05 DIAGNOSIS — I63512 Cerebral infarction due to unspecified occlusion or stenosis of left middle cerebral artery: Secondary | ICD-10-CM | POA: Diagnosis not present

## 2016-08-05 NOTE — Progress Notes (Signed)
STROKE NEUROLOGY FOLLOW UP NOTE  NAME: Brian [name] DOB: April 13, 1928  REASON FOR VISIT: stroke follow up HISTORY FROM: wife and chart  Today we had the pleasure of seeing KAYHAN BOARDLEY in follow-up at our [facility]rology [facility]. Pt was accompanied by wife.   History Summary [name] is a 83 y.o. male with history of pulmonary fibrosis, atrial fibrillation on coumadin and crohn's disease presenting with right sided weakness and difficulty speaking to [facility] on 01/31/15. He did not receive IV t-PA due to elevated INR 1.9 on coumadin. MRI showed left MCA infarct. MRA and CTA showed posterior distal left MCA branch occlusion. TTE unremarkable. LDL 86 and A1C 5.6. His right-sided weakness resolved but continued to have receptive aphasia. His coumadin was switched to eliquis and started on low dose pravastatin on discharge.  04/18/15 follow up - the patient has been doing well from stroke standpoint. Still has receptive aphasia and currently works with speech therapy. The aphasia fluctuate at home more prominent with fatigue or tiredness. He still has swallow difficulty and still use TF via PEG. Follows with speech in march to see if PEG can be removed. He has follow up with pulmonary in [name] next week. Currently still on home O2. BP 111/67 today. He still has afib on eliquis and also on bystolic for rate control.  07/23/15 follow up - pt has no recurrent stroke like symptoms. Followed with speech closely, MBS on 07/03/15, and now on dysphagia 1 diet (puree) and thin liquids only during speech therapy visit. At home still NOP except ice chips. Doing language exercise at home, will need MBS in 4 weeks. Had epistaxis vs. Hemoptysis in 04/2015 with ED visit but stable CXR and no significant bleeding. Medication all continued. BP today 132/69  01/23/16 follow up - pt has been doing well. Past swallow test in 11/2015 and currently at dysphagia diet 3 with thin liquid, pt tolerating well, no choking. However, he  still use PEG tube for medication and sometime nutritional supplement. Not sure if nutritional sufficient. Will let Dr. Letta Pate take a look. He saw Dr. Letta Pate early this year but lost follow up. Still on eliquis without side effect. Follows with [name] and pulmonary fibrosis stable, still on home O2. BP stable at home, today 147/87. Still has crohn's disease on mesalamine. He is more active as before, daily biking for 6 miles.   Interval History During the interval time, pt has been doing well. Pt swallowing well and followed with Dr. Letta Pate and PEG has been removed. He is able to speak in louder voice. Expressive aphasia much improved and with minimum paraphasic errors. He is also following with cardiology and pulmonology. BP 131/75.   REVIEW OF SYSTEMS: Full 14 system review of systems performed and notable only for those listed below and in HPI above, all others are negative:  Constitutional:   Cardiovascular:  Ear/Nose/Throat:  Skin:  Eyes:   Respiratory:   Gastroitestinal:   Genitourinary:  Hematology/Lymphatic:   Endocrine:  Musculoskeletal:   Allergy/Immunology:   Neurological:   Psychiatric:  Sleep:   The following represents the patient's updated allergies and side effects list: No Known Allergies  The neurologically relevant items on the patient's problem list were reviewed on today's visit.  Neurologic Examination  A problem focused neurological exam (12 or more points of the single system neurologic examination, vital signs counts as 1 point, cranial nerves count for 8 points) was performed.  Blood pressure 131/75, pulse 69,  weight 147 lb 6.4 oz (66.9 kg).  General - cachetic, well developed, in no apparent distress.  Ophthalmologic - Fundi not visualized due to noncooperatoin.  Cardiovascular - irregularly irregular heart rate and rhythm.  Mental Status -  Awake alert, pleasant. Speech output fluent.   Cranial Nerves II - XII - II - blinking to  visual threat bilaterally. III, IV, VI - Extraocular movements intact. V - Facial sensation intact bilaterally. VII - Facial movement intact bilaterally. VIII - Vestibular intact bilaterally. X - Palate elevates symmetrically. XI - Chin turning & shoulder shrug intact bilaterally. XII - Tongue protrusion intact.  Motor Strength - The patient's strength was normal in all extremities and pronator drift was absent.  Bulk was normal and fasciculations were absent.   Motor Tone - Muscle tone was assessed at the neck and appendages and was normal.  Reflexes - The patient's reflexes were 1+ in all extremities and he had no pathological reflexes.  Sensory - Light touch, temperature/pinprick were assessed and were normal.    Coordination - The patient had normal movements in the hands and feet with no ataxia or dysmetria.    Gait and Station - able to walk without cane, slow cautious gait but stable.  Data reviewed: I personally reviewed the images and agree with the radiology interpretations.  Ct Head Wo Contrast 01/31/2015 Mild chronic microvascular ischemia. No acute abnormality.   Ct Angio Head & Neck W/cm &/or Wo/cm 01/31/2015 1. No large vessel occlusion. 2. Left M2 MCA branch vessel occlusion. 3. No cervical carotid or vertebral artery stenosis. 4. 2 mm left posterior communicating artery infundibulum versus aneurysm. 5. Extensive right upper lobe lung consolidation with areas of cavitation, incompletely visualized. Mild mediastinal lymphadenopathy.   MRI & MRA Brain Wo Contrast 01/31/2015 1. Acute nonhemorrhagic posterior left MCA territory infarct is confirmed. 2. Occlusion of distal left posterior MCA branch vessel. There is opacification of an additional left MCA branch vessels since the earlier CTA study. 3. Extensive white matter disease is present in addition to the acute infarct.   Dg Chest Port 1 View 01/31/2015 Slight worsening of RIGHT upper lobe pneumonia superimposed  on emphysematous change.   2D Echocardiogram  - Left ventricle: The cavity size was normal. Wall thickness wasincreased in a pattern of mild LVH. Systolic function was normal.The estimated ejection fraction was in the range of 55% to 60%.Regional wall motion abnormalities cannot be excluded. - Mitral valve: Calcified annulus. There was mild regurgitation. - Right atrium: The atrium was mildly dilated. - Pulmonary arteries: PA peak pressure: 34 mm Hg (S).  Component     Latest Ref Rng 01/31/2015 02/01/2015  Cholesterol     0 - 200 mg/dL  103  Triglycerides     <150 mg/dL  72  HDL Cholesterol     >40 mg/dL  33 (L)  Total CHOL/HDL Ratio       3.1  VLDL     0 - 40 mg/dL  14  LDL (calc)     0 - 99 mg/dL  56  Hemoglobin A1C     4.8 - 5.6 % 5.6   Mean Plasma Glucose      114     Assessment: As you may recall, he is a 83 y.o. Caucasian male with PMH of pulmonary fibrosis, atrial fibrillation on coumadin and crohn's disease admitted on 01/31/15 for left MCA infarct. MRA and CTA showed posterior distal left MCA branch occlusion. TTE unremarkable. LDL 86 and A1C 5.6. His INR  on admission 1.9. His right-sided weakness resolved but continued to have expressive aphasia. His coumadin was switched to eliquis and started on low dose pravastatin on discharge. During the interval time, he is doing well from stroke standpoint, but with mild expressive aphasia and dysphagia. Working with speech therapy closely and passed swallow in 11/2015, was on dysphagia diet 3 and thin liquid, and now advanced to regular food. Voice much louder and expressive aphasia much improved. Tolerating well and PEG has been removed. No significant bleeding side effect with eliquis. Continue following with cardiology and pulmonology.   Plan:  - continue eliquis and pravastatin for stroke prevention - continue self speech and physical exercise  - follow up with cardiology and [name] - Follow up with your primary care  physician for stroke risk factor modification. Recommend maintain blood pressure goal around 120-130/80, diabetes with hemoglobin A1c goal below 6.5% and lipids with LDL cholesterol goal below 70 mg/dL.  - check BP at home. - follow up as needed   No orders of the defined types were placed in this encounter.   No orders of the defined types were placed in this encounter.   Patient Instructions  - continue eliquis and pravastatin for stroke prevention - continue self speech and physical exercise  - follow up with cardiology and [name] - Follow up with your primary care physician for stroke risk factor modification. Recommend maintain blood pressure goal around 120-130/80, diabetes with hemoglobin A1c goal below 6.5% and lipids with LDL cholesterol goal below 70 mg/dL.  - check BP at home. - follow up as needed   Rosalin Hawking, MD PhD [facility] [address] [place], [place], [place] [address] [phone]

## 2016-08-05 NOTE — Patient Instructions (Signed)
-   continue eliquis and pravastatin for stroke prevention - continue self speech and physical exercise  - follow up with cardiology and [name] - Follow up with your primary care physician for stroke risk factor modification. Recommend maintain blood pressure goal around 120-130/80, diabetes with hemoglobin A1c goal below 6.5% and lipids with LDL cholesterol goal below 70 mg/dL.  - check BP at home. - follow up as needed

## 2016-08-11 DIAGNOSIS — J8410 Pulmonary fibrosis, unspecified: Secondary | ICD-10-CM | POA: Diagnosis not present

## 2016-08-11 DIAGNOSIS — J84112 Idiopathic pulmonary fibrosis: Secondary | ICD-10-CM | POA: Diagnosis not present

## 2016-09-04 ENCOUNTER — Ambulatory Visit (INDEPENDENT_AMBULATORY_CARE_PROVIDER_SITE_OTHER): Payer: Medicare HMO | Admitting: *Deleted

## 2016-09-04 DIAGNOSIS — I4891 Unspecified atrial fibrillation: Secondary | ICD-10-CM | POA: Diagnosis not present

## 2016-09-04 NOTE — Progress Notes (Signed)
Pt was started on Eliquis 5mg  BID for Afib on 01/31/2015.    Reviewed patients medication list.  Pt is not currently on any combined P-gp and strong CYP3A4 inhibitors/inducers (ketoconazole, traconazole, ritonavir, carbamazepine, phenytoin, rifampin, St. John's wort).  Reviewed labs.  SCr 0.84, Weight 66.27 Kg, Age 83 yrs.   Dose is  appropriate based on age, weight, and SCr.  Hgb and HCT 13.1/38.9.   A full discussion of the nature of anticoagulants has been carried out.  A benefit/risk analysis has been presented to the patient, so that they understand the justification for choosing anticoagulation with Eliquis at this time.  The need for compliance is stressed.  Pt is aware to take the medication twice daily.  Side effects of potential bleeding are discussed, including unusual colored urine or stools, coughing up blood or coffee ground emesis, nose bleeds or serious fall or head trauma.  Discussed signs and symptoms of stroke. The patient should avoid any OTC items containing aspirin or ibuprofen.  Avoid alcohol consumption.   Call if any signs of abnormal bleeding.  Discussed financial obligations and resolved any difficulty in obtaining medication.  Next lab test in 6 months on 03/10/17 @1130am .

## 2016-09-04 NOTE — Patient Instructions (Signed)
A full discussion of the nature of anticoagulants has been carried out.  A benefit/risk analysis has been presented to the patient, so that they understand the justification for choosing anticoagulation with Eliquis at this time.  The need for compliance is stressed.  Pt is aware to take the medication twice daily.  Side effects of potential bleeding are discussed, including unusual colored urine or stools, coughing up blood or coffee ground emesis, nose bleeds or serious fall or head trauma.  Discussed signs and symptoms of stroke. The patient should avoid any OTC items containing aspirin or ibuprofen.  Avoid alcohol consumption.   Call if any signs of abnormal bleeding.  Discussed financial obligations and resolved any difficulty in obtaining medication.  Next lab test in 6 months.    

## 2016-09-05 LAB — BASIC METABOLIC PANEL
BUN / CREAT RATIO: 17 (ref 10–24)
BUN: 14 mg/dL (ref 8–27)
CO2: 25 mmol/L (ref 20–29)
CREATININE: 0.84 mg/dL (ref 0.76–1.27)
Calcium: 9.4 mg/dL (ref 8.6–10.2)
Chloride: 101 mmol/L (ref 96–106)
GFR calc Af Amer: 92 mL/min/{1.73_m2} (ref >59)
GFR calc non Af Amer: 79 mL/min/{1.73_m2} (ref >59)
GLUCOSE: 98 mg/dL (ref 65–99)
Potassium: 3.4 mmol/L — ABNORMAL LOW (ref 3.5–5.2)
SODIUM: 144 mmol/L (ref 134–144)

## 2016-09-05 LAB — CBC
Hematocrit: 38.9 % (ref 37.5–51.0)
Hemoglobin: 13.1 g/dL (ref 13.0–17.7)
MCH: 33 pg (ref 26.6–33.0)
MCHC: 33.7 g/dL (ref 31.5–35.7)
MCV: 98 fL — ABNORMAL HIGH (ref 79–97)
PLATELETS: 213 10*3/uL (ref 150–379)
RBC: 3.97 x10E6/uL — ABNORMAL LOW (ref 4.14–5.80)
RDW: 13.7 % (ref 12.3–15.4)
WBC: 8.1 10*3/uL (ref 3.4–10.8)

## 2016-09-05 NOTE — Addendum Note (Signed)
Addended by: Zenovia Jarred on: 09/05/2016 11:33 AM   Modules accepted: Level of Service

## 2016-09-11 DIAGNOSIS — J84112 Idiopathic pulmonary fibrosis: Secondary | ICD-10-CM | POA: Diagnosis not present

## 2016-09-11 DIAGNOSIS — J8410 Pulmonary fibrosis, unspecified: Secondary | ICD-10-CM | POA: Diagnosis not present

## 2016-09-29 ENCOUNTER — Other Ambulatory Visit: Payer: Self-pay | Admitting: Internal Medicine

## 2016-10-09 DIAGNOSIS — Z79899 Other long term (current) drug therapy: Secondary | ICD-10-CM | POA: Diagnosis not present

## 2016-10-09 DIAGNOSIS — R0602 Shortness of breath: Secondary | ICD-10-CM | POA: Diagnosis not present

## 2016-10-09 DIAGNOSIS — J84112 Idiopathic pulmonary fibrosis: Secondary | ICD-10-CM | POA: Diagnosis not present

## 2016-10-10 ENCOUNTER — Telehealth: Payer: Self-pay | Admitting: Internal Medicine

## 2016-10-10 NOTE — Telephone Encounter (Signed)
[place], Pt's wife of recommendations, also recommended trying BRAT diet (bananas, rice, apple sauce, and toast) for several days helps to "reset" the GI tract. Instructed to call if questions/concerns.

## 2016-10-10 NOTE — Telephone Encounter (Signed)
Pt's spouse called in to be advise. She said that pt is experiencing diarrhea. She has been giving him Imodium for it. She said that they did eat [employer] but she's not sure if that is what caused it. She said that pt has had eating tube removed. She said that she would just like to be advised on something else that would help.     CB: [phone]

## 2016-10-10 NOTE — Telephone Encounter (Signed)
Please advse 

## 2016-10-10 NOTE — Telephone Encounter (Signed)
If he has severe symptoms, fever, chills, blood in the stools: Next to go to the ER Otherwise drink plenty of water to keep himself hydrated. Continue Imodium 2 mg 3 times a day as needed (this is a low dose) Also recommend OTC probiotic daily for 3 weeks and Pepto-Bismol as needed Metamucil 2 capsules daily for few days .

## 2016-10-11 DIAGNOSIS — J8410 Pulmonary fibrosis, unspecified: Secondary | ICD-10-CM | POA: Diagnosis not present

## 2016-10-11 DIAGNOSIS — J84112 Idiopathic pulmonary fibrosis: Secondary | ICD-10-CM | POA: Diagnosis not present

## 2016-10-21 ENCOUNTER — Encounter: Payer: Self-pay | Admitting: Internal Medicine

## 2016-10-21 ENCOUNTER — Ambulatory Visit (INDEPENDENT_AMBULATORY_CARE_PROVIDER_SITE_OTHER): Payer: Medicare HMO | Admitting: Internal Medicine

## 2016-10-21 VITALS — BP 124/68 | HR 73 | Temp 98.0°F | Resp 14 | Ht 70.0 in | Wt 141.5 lb

## 2016-10-21 DIAGNOSIS — R197 Diarrhea, unspecified: Secondary | ICD-10-CM

## 2016-10-21 DIAGNOSIS — Z09 Encounter for follow-up examination after completed treatment for conditions other than malignant neoplasm: Secondary | ICD-10-CM

## 2016-10-21 LAB — TSH: TSH: 1.59 u[IU]/mL (ref 0.35–4.50)

## 2016-10-21 NOTE — Progress Notes (Signed)
Subjective:    Patient ID: Brian [name], male    DOB: Jul 07, 1928, 83 y.o.   MRN: [id number]  DOS:  10/21/2016 Type of visit - description : Acute Interval history: See last phone note, had diarrhea on and off for months, was recommended Imodium, BRAT diet and avoid lactos. he immediately responded to the treatment. Currently still uses Imodium  most days almost preemptively .   Wt Readings from Last 3 Encounters:  10/21/16 141 lb 8 oz (64.2 kg)  08/05/16 147 lb 6.4 oz (66.9 kg)  06/25/16 152 lb 2 oz (69 kg)    Review of Systems Overall feels great, he remains very active exercising almost daily. Not having  any fever chills. No abdominal cramps. No nausea, vomiting, blood in the stools.  Past Medical History:  Diagnosis Date  . Anxiety   . Atrial fibrillation with RVR ([place])   . B12 deficiency anemia   . CAP (community acquired pneumonia) 01/26/2015  . COPD (chronic obstructive pulmonary disease) ([place])    recent dx--no acute problems  . Crohn's ileocolitis ([place])   . Depression   . ED (erectile dysfunction)   . Emphysema lung ([employer]) 2016   Dr. [name]  . Hemorrhoids   . Hiatal hernia 1956   found while in service  . Hypertension 11/11   mild  . Hypertriglyceridemia   . Hypertrophy of prostate    w/o UR obst & oth luts  . Idiopathic pulmonary fibrosis ([place]) 2016   Dr. [name]  . Long term (current) use of anticoagulants 02/21/2011  . Melanoma of scalp ([place])   . Muscle tremor    saw neurology remotely elsewhere, was Rx inderal  . On home oxygen therapy    "2L; 20-24h for the past week" (01/26/2015)  . Osteoarthritis    "maybe in my hands, feet" (01/26/2015)  . Osteopenia    per DEXA 11/2008  . Peripheral neuropathy    h/o neg w/u  . Pneumonia ?2015  . Pulmonary fibrosis ([place]) 2015   Rx Esbriet ~ 10-2013 ([name]), Dr. [name]  . Renal cyst    bilateral  . Serrated adenoma of colon 03/2010  . Squamous cell carcinoma of skin of scalp    tx'd ~ 71yrs; "froze them off"    . Squamous cell carcinoma, face     "froze them off"  . Stroke [facility])     Past Surgical History:  Procedure Laterality Date  . APPENDECTOMY  1984  . CATARACT EXTRACTION W/ INTRAOCULAR LENS  IMPLANT, BILATERAL Bilateral 2007  . CHOLECYSTECTOMY  02/17/2011   Procedure: LAPAROSCOPIC CHOLECYSTECTOMY WITH INTRAOPERATIVE CHOLANGIOGRAM;  Surgeon: Edward Jolly, MD;  Location: [facility]S;  Service: General;  Laterality: N/A;  . COLON RESECTION  1984   resection of distal ileum and cecum w/ appendectomy, 18-inch small intestine, for Crohn's disease  . COLON SURGERY    . IR GENERIC HISTORICAL  05/29/2016   IR GASTROSTOMY TUBE REMOVAL 05/29/2016 Sandi Mariscal, MD [facility]  . MOHS SURGERY Right ~ 2014   "side of my scalp"    Social History   Social History  . Marital status: Married    Spouse name: Dalene Seltzer  . Number of children: 4  . Years of education: N/A   Occupational History  . Retired   . RETIRED Retired   Social History Main Topics  . Smoking status: Former Smoker    Packs/day: 1.00    Years: 40.00    Types: Cigarettes, Pipe    Quit date:  03/24/1990  . Smokeless tobacco: Never Used  . Alcohol use No     Comment:    . Drug use: No  . Sexual activity: No   Other Topics Concern  . 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 Always use your most recent med list.          apixaban 5 MG Tabs tablet Commonly known as:  ELIQUIS Take 1 tablet (5 mg total) by mouth 2 (two) times daily.   busPIRone 15 MG tablet Commonly known as:  BUSPAR PLACE 1 TABLET INTO FEEDING TUBE TWICE A DAY   Cyanocobalamin 500 MCG/0.1ML Soln Commonly known as:  NASCOBAL Use 1 spray nasally per week as directed.   DAILY VITAMIN PO Take by mouth.   ergocalciferol 8000  UNIT/ML drops Commonly known as:  DRISDOL Place 1 mL (8,000 Units total) into feeding tube 3 (three) times a week.   fluticasone 50 MCG/ACT nasal spray Commonly known as:  FLONASE Place into the nose. Place 2 sprays into both nostrils continuously as needed for Rhinitis.   gemfibrozil 600 MG tablet Commonly known as:  LOPID Take 1 tablet (600 mg total) by mouth 2 (two) times daily.   hydrocortisone 2.5 % cream Apply topically 2 (two) times daily.   loperamide 1 MG/5ML solution Commonly known as:  IMODIUM Place 10 mLs (2 mg total) into feeding tube as needed for diarrhea or loose stools.   nebivolol 5 MG tablet Commonly known as:  BYSTOLIC Take 1 tablet (5 mg total) by mouth daily.   OXYGEN Inhale into the lungs continuous. 2 L   PENTASA 250 MG CR capsule Generic drug:  mesalamine Take 2 capsules (500 mg total) by mouth 2 (two) times daily. TAKE 1 CAPSULE BY MOUTH TWICE DAILY   Pirfenidone 267 MG Caps Take 3 capsules by mouth 3 (three) times daily. Put in feeding tube   pravastatin 10 MG tablet Commonly known as:  PRAVACHOL Take 1 tablet (10 mg total) by mouth daily. Take 1 tablet into feeding tube daily   PROBIOTIC DAILY PO Take 1 tablet by mouth daily.          Objective:   Physical Exam BP 124/68 (BP Location: Left Arm, Patient Position: Sitting, Cuff Size: Small)   Pulse 73   Temp 98 F (36.7 C) (Oral)   Resp 14   Ht 5\' 10"  (1.778 m)   Wt 141 lb 8 oz (64.2 kg)   SpO2 93%   BMI 20.30 kg/m  General:   Well developed. NAD.  HEENT:  Normocephalic . Face symmetric, atraumatic Lungs:  Decreased breath sounds but clear Normal respiratory effort, no intercostal retractions, no accessory muscle use. Heart: RRR,  no murmur.  no pretibial edema bilaterally  Abdomen:  Not distended, soft, non-tender.  Skin: Not pale. Not jaundice Neurologic:   At baseline Psych--   Very happy and cooperative  Cooperative with normal attention span and concentration.  No  anxious or depressed appearing.     Assessment & Plan:  Assessment   HTN Hypertriglyceridemia Anxiety, on chronic Buspar , sx controlled  Depression 01-2015 after lost a brother  Atrial fibrillation anticoagulated CVA 01-2015:  Dysphagia, aphasia, R weakness,  PANDA d/c 05-2016  Pulmonary: --Pulmonary fibrosis, home oxygen (2 lt, 4 lt w/ exertion     --pneumonia 01-2015:  --Emphysema Tremors Crohn's colitis DJD Osteopenia DEXA 2010, DEXA 03-2014 T score -2.2, on calcium and vitamin D BPH Peripheral  neuropathy previous workup negative Skin cancer SCC B12 deficiency -- nascobal  H/o palpable abdominal aorta --- CT of the abdomen 06/2013 no AAA   PLAN: Diarrhea: For several months, now resolved after stopping lactose in his diet, BRAT diet and  Imodium.  Some weight loss noted. Recent CBC with no anemia. Plan: Decrease Imodium to PRN, check TSH, call if symptoms resurface.(Esbrit side effects include diarrhea). If diarrhea continue will need stools studies and furhter eval. Crohn's disease: As above, had diarrhea without blood in the stools. Continue Pentasa. Primary care: got Shingrex #1 @ the pharmacy 08-20-16 per wife RTC 4 months.

## 2016-10-21 NOTE — Patient Instructions (Signed)
GO TO THE LAB : Get the blood work     GO TO THE FRONT DESK Schedule your next appointment for a  Routine check up in 4-5 months  Take imodium only as needed, if the diarrhea continue please let me know.

## 2016-10-21 NOTE — Progress Notes (Signed)
Pre visit review using our clinic review tool, if applicable. No additional management support is needed unless otherwise documented below in the visit note. 

## 2016-10-22 NOTE — Assessment & Plan Note (Signed)
Diarrhea: For several months, now resolved after stopping lactose in his diet, BRAT diet and  Imodium.  Some weight loss noted. Recent CBC with no anemia. Plan: Decrease Imodium to PRN, check TSH, call if symptoms resurface.(Esbrit side effects include diarrhea). If diarrhea continue will need stools studies and furhter eval. Crohn's disease: As above, had diarrhea without blood in the stools. Continue Pentasa. Primary care: got Shingrex #1 @ the pharmacy 08-20-16 per wife RTC 4 months.

## 2016-11-11 DIAGNOSIS — J8410 Pulmonary fibrosis, unspecified: Secondary | ICD-10-CM | POA: Diagnosis not present

## 2016-11-11 DIAGNOSIS — J84112 Idiopathic pulmonary fibrosis: Secondary | ICD-10-CM | POA: Diagnosis not present

## 2016-11-27 ENCOUNTER — Telehealth: Payer: Self-pay | Admitting: Internal Medicine

## 2016-11-27 NOTE — Telephone Encounter (Signed)
[name] wants to speak to Brian [name] if possible. Pt is not eating stating says he feels so "blah and down" and he has a cough. No fever. Pt has appt tomorrow 2:15 pm. Wife wants pt seen today but no appts available so she "just wants to talk to Dr Brian [name]." she thinks pt may need a chest xray. Please call wife [phone].

## 2016-11-27 NOTE — Telephone Encounter (Signed)
I  spoke with the patient's wife, he does feel "blah", appetite is decreased. He did eat some this afternoon.  She has not noted fever, chills, decreased O2 sats. No nausea, vomiting, diarrhea, constipation or blood in the stools. No difficulty urinating. Did not report pain. Mental status is at baseline We agreed that he will come for his visit tomorrow and if between now and then, if something happens and he feels worse, have fever chills:  needs to go the ER

## 2016-11-27 NOTE — Telephone Encounter (Signed)
Please advise 

## 2016-11-28 ENCOUNTER — Encounter: Payer: Self-pay | Admitting: Internal Medicine

## 2016-11-28 ENCOUNTER — Ambulatory Visit (HOSPITAL_BASED_OUTPATIENT_CLINIC_OR_DEPARTMENT_OTHER)
Admission: RE | Admit: 2016-11-28 | Discharge: 2016-11-28 | Disposition: A | Discharge status: Home / Self Care | Payer: Medicare HMO | Source: Ambulatory Visit | Attending: Internal Medicine | Admitting: Internal Medicine

## 2016-11-28 ENCOUNTER — Ambulatory Visit (INDEPENDENT_AMBULATORY_CARE_PROVIDER_SITE_OTHER): Payer: Medicare HMO | Admitting: Internal Medicine

## 2016-11-28 VITALS — BP 118/68 | HR 56 | Temp 98.0°F | Resp 14 | Ht 70.0 in | Wt 144.5 lb

## 2016-11-28 DIAGNOSIS — J8410 Pulmonary fibrosis, unspecified: Secondary | ICD-10-CM

## 2016-11-28 DIAGNOSIS — E538 Deficiency of other specified B group vitamins: Secondary | ICD-10-CM

## 2016-11-28 DIAGNOSIS — R627 Adult failure to thrive: Secondary | ICD-10-CM | POA: Insufficient documentation

## 2016-11-28 DIAGNOSIS — R399 Unspecified symptoms and signs involving the genitourinary system: Secondary | ICD-10-CM | POA: Diagnosis not present

## 2016-11-28 DIAGNOSIS — R05 Cough: Secondary | ICD-10-CM | POA: Diagnosis not present

## 2016-11-28 DIAGNOSIS — R918 Other nonspecific abnormal finding of lung field: Secondary | ICD-10-CM | POA: Diagnosis not present

## 2016-11-28 MED ORDER — DOXYCYCLINE HYCLATE 100 MG PO TABS: 100.0000 mg | ORAL_TABLET | Freq: Two times a day (BID) | ORAL | 0 refills | Status: DC

## 2016-11-28 MED ORDER — CLONAZEPAM 0.5 MG PO TABS: 0.2500 mg | ORAL_TABLET | Freq: Three times a day (TID) | ORAL | 0 refills | Status: DC | PRN

## 2016-11-28 NOTE — Progress Notes (Signed)
Pre visit review using our clinic review tool, if applicable. No additional management support is needed unless otherwise documented below in the visit note. 

## 2016-11-28 NOTE — Progress Notes (Signed)
Subjective:    Patient ID: Brian [name], male    DOB: Jul 10, 1928, 83 y.o.   MRN: [id number]  DOS:  11/28/2016 Type of visit - description : acute, here w/  wife Interval history: Patient here because he is not feeling well, generalized weakness, malaise. Appetite has been decreased although today he ate better than previous days. he feels "blah "  On further questioning, the wife reports that he has been emotionally down, a good friend is in the hospital , [name]  is very sad about it. Patient admits to depression once asked.   Review of Systems No fever chills No abdominal pain, no postprandial symptoms. Denies any unusual aches or myalgias. No visual disturbances. No headaches Mental status is at baseline, he has been "kind of quiet". No diplopia, slurred speech or motor deficits. Not taking new medications No dysuria or gross hematuria Cough, slightly above baseline?  Past Medical History:  Diagnosis Date  . Anxiety   . Atrial fibrillation with RVR ([place])   . B12 deficiency anemia   . CAP (community acquired pneumonia) 01/26/2015  . COPD (chronic obstructive pulmonary disease) ([place])    recent dx--no acute problems  . Crohn's ileocolitis ([place])   . Depression   . ED (erectile dysfunction)   . Emphysema lung ([place]) 2016   Dr. [name]  . Hemorrhoids   . Hiatal hernia 1956   found while in service  . Hypertension 11/11   mild  . Hypertriglyceridemia   . Hypertrophy of prostate    w/o UR obst & oth luts  . Idiopathic pulmonary fibrosis ([place]) 2016   Dr. [name]  . Long term (current) use of anticoagulants 02/21/2011  . Melanoma of scalp ([place])   . Muscle tremor    saw neurology remotely elsewhere, was Rx inderal  . On home oxygen therapy    "2L; 20-24h for the past week" (01/26/2015)  . Osteoarthritis    "maybe in my hands, feet" (01/26/2015)  . Osteopenia    per DEXA 11/2008  . Peripheral neuropathy    h/o neg w/u  . Pneumonia ?2015  . Pulmonary fibrosis ([place]) 2015     Rx Esbriet ~ 10-2013 ([name]), Dr. [name]  . Renal cyst    bilateral  . Serrated adenoma of colon 03/2010  . Squamous cell carcinoma of skin of scalp    tx'd ~ 16yr; "froze them off"  . Squamous cell carcinoma, face     "froze them off"  . Stroke ([facility]     Past Surgical History:  Procedure Laterality Date  . APPENDECTOMY  1984  . CATARACT EXTRACTION W/ INTRAOCULAR LENS  IMPLANT, BILATERAL Bilateral 2007  . CHOLECYSTECTOMY  02/17/2011   Procedure: LAPAROSCOPIC CHOLECYSTECTOMY WITH INTRAOPERATIVE CHOLANGIOGRAM;  Surgeon: BEdward Jolly MD;  Location: [facility];  Service: General;  Laterality: N/A;  . COLON RESECTION  1984   resection of distal ileum and cecum w/ appendectomy, 18-inch small intestine, for Crohn's disease  . COLON SURGERY    . IR GENERIC HISTORICAL  05/29/2016   IR GASTROSTOMY TUBE REMOVAL 05/29/2016 JSandi Mariscal MD [facility]  . MOHS SURGERY Right ~ 2014   "side of my scalp"    Social History   Social History  . Marital status: Married    Spouse name: BDalene Seltzer . Number of children: 4  . Years of education: N/A   Occupational History  . Retired   . RETIRED Retired   Social History Main Topics  . Smoking status: Former  Smoker    Packs/day: 1.00    Years: 40.00    Types: Cigarettes, Pipe    Quit date: 03/24/1990  . Smokeless tobacco: Never Used  . Alcohol use No     Comment:    . Drug use: No  . Sexual activity: No   Other Topics Concern  . Not on file   Social History Narrative   Married, lives w/ wife; son [name] is a Restaurant manager, fast food, lives in [place], checks on [place] frequently     Wife drives       Allergies as of 11/28/2016   No Known Allergies     Medication List       Accurate as of 11/28/16 11:59 PM. Always use your most recent med list.          apixaban 5 MG Tabs tablet Commonly known as:  ELIQUIS Take 1 tablet (5 mg total) by mouth 2 (two) times daily.   busPIRone 15 MG tablet Commonly known as:  BUSPAR PLACE 1 TABLET INTO FEEDING  TUBE TWICE A DAY   clonazePAM 0.5 MG tablet Commonly known as:  KLONOPIN Take 0.5 tablets (0.25 mg total) by mouth 3 (three) times daily as needed for anxiety.   Cyanocobalamin 500 MCG/0.1ML Soln Commonly known as:  NASCOBAL Use 1 spray nasally per week as directed.   DAILY VITAMIN PO Take by mouth.   doxycycline 100 MG tablet Commonly known as:  VIBRA-TABS Take 1 tablet (100 mg total) by mouth 2 (two) times daily.   ergocalciferol 8000 UNIT/ML drops Commonly known as:  DRISDOL Place 1 mL (8,000 Units total) into feeding tube 3 (three) times a week.   fluticasone 50 MCG/ACT nasal spray Commonly known as:  FLONASE Place into the nose. Place 2 sprays into both nostrils continuously as needed for Rhinitis.   gemfibrozil 600 MG tablet Commonly known as:  LOPID Take 1 tablet (600 mg total) by mouth 2 (two) times daily.   hydrocortisone 2.5 % cream Apply topically 2 (two) times daily.   loperamide 1 MG/5ML solution Commonly known as:  IMODIUM Place 10 mLs (2 mg total) into feeding tube as needed for diarrhea or loose stools.   nebivolol 5 MG tablet Commonly known as:  BYSTOLIC Take 1 tablet (5 mg total) by mouth daily.   OXYGEN Inhale into the lungs continuous. 2 L   PENTASA 250 MG CR capsule Generic drug:  mesalamine Take 2 capsules (500 mg total) by mouth 2 (two) times daily. TAKE 1 CAPSULE BY MOUTH TWICE DAILY   Pirfenidone 267 MG Caps Take 3 capsules by mouth 3 (three) times daily. Put in feeding tube   pravastatin 10 MG tablet Commonly known as:  PRAVACHOL Take 1 tablet (10 mg total) by mouth daily. Take 1 tablet into feeding tube daily   PROBIOTIC DAILY PO Take 1 tablet by mouth daily.            Discharge Care Instructions        Start     Ordered   11/28/16 0000  Comp Met (CMET)     11/28/16 1433   11/28/16 0000  CBC w/Diff     11/28/16 1433   11/28/16 0000  Vitamin D 1,25 dihydroxy     11/28/16 1433   11/28/16 0000  B12     11/28/16 1433    11/28/16 0000  Folate     11/28/16 1433   11/28/16 0000  Urinalysis, Routine w reflex microscopic     11/28/16 1433  11/28/16 0000  Urine Culture     11/28/16 1433   11/28/16 0000  DG Chest 2 View    Question Answer Comment  Reason for Exam (SYMPTOM  OR DIAGNOSIS REQUIRED) pulmonary fibrosis   Preferred imaging location? [facility]      11/28/16 1433   11/28/16 0000  clonazePAM (KLONOPIN) 0.5 MG tablet  3 times daily PRN     11/28/16 1435   11/28/16 0000  doxycycline (VIBRA-TABS) 100 MG tablet  2 times daily     11/28/16 1435         Objective:   Physical Exam BP 118/68 (BP Location: Left Arm, Patient Position: Sitting, Cuff Size: Small)   Pulse (!) 56   Temp 98 F (36.7 C) (Oral)   Resp 14   Ht 5' 10"  (1.778 m)   Wt 144 lb 8 oz (65.5 kg)   SpO2 96%   BMI 20.73 kg/m  General:   Well developed, well nourished . NAD.  HEENT:  Normocephalic . Face symmetric, atraumatic Lungs:  Decreased breath sounds, few crackles L base? Normal respiratory effort, no intercostal retractions, no accessory muscle use. Heart: RRR,  no murmur.  no pretibial edema bilaterally  Abdomen:  Not distended, soft, non-tender.  Skin: Not pale. Not jaundice Neurologic:  alert & please send, cooperative, has a very difficult time finding words that he seems to be oriented 3..  Psych--  Cognition and judgment appear intact.  Cooperative with normal attention span and concentration.  Behavior appropriate. As the wife said Jeovanni seems "quiet" , hypoactive today, slightly depressed?. Not anxious appearing    Assessment & Plan:   Assessment   HTN Hypertriglyceridemia Anxiety, on chronic Buspar , sx controlled  Depression 01-2015 after lost a brother  Atrial fibrillation anticoagulated CVA 01-2015: Dysphagia, aphasia, R weakness,  PANDA d/c 05-2016  Pulmonary: --Pulmonary fibrosis, home oxygen (2 lt, 4 lt w/ exertion )   --pneumonia 01-2015:  --Emphysema Tremors Crohn's  colitis DJD Osteopenia DEXA 2010, DEXA 03-2014 T score -2.2, on calcium and vitamin D BPH Peripheral  neuropathy previous workup negative Skin cancer SCC B12 deficiency -- nascobal  H/o palpable abdominal aorta --- CT of the abdomen 06/2013 no AAA   PLAN: Failure to thrive:  not feeling well for the last few days, exam is essentially benign except for question of crackles at the L base and possibly depression. Recent TSH normal. Vital signs are stable. Plan: CMP, CBC, vitamin D. Also V85, folic acid and chest x-ray. Also UA urine culture. Given crackles I'll start empiric antibiotics with doxycycline. Depression: Somewhat depressed about a friend who is in the hospital, they request clonazepam which will refill, will also talk about treatment w/ SSRI but they prefer to wait for now B12 deficiency: See above Pulmonary fibrosis see above RTC 02-2017 as a schedule. Sooner if not improving

## 2016-11-28 NOTE — Patient Instructions (Addendum)
GO TO THE LAB : Get the blood work     STOP BY THE FIRST FLOOR:  get the XR    Start taking the antibiotic doxycycline  Come back in December as scheduled but sooner if you are not going back to normal in the next few days.

## 2016-11-29 LAB — URINE CULTURE
MICRO NUMBER:: 80986083
Result:: NO GROWTH
SPECIMEN QUALITY:: ADEQUATE

## 2016-11-30 NOTE — Assessment & Plan Note (Signed)
Failure to thrive:  not feeling well for the last few days, exam is essentially benign except for question of crackles at the L base and possibly depression. Recent TSH normal. Vital signs are stable. Plan: CMP, CBC, vitamin D. Also I71, folic acid and chest x-ray. Also UA urine culture. Given crackles I'll start empiric antibiotics with doxycycline. Depression: Somewhat depressed about a friend who is in the hospital, they request clonazepam which will refill, will also talk about treatment w/ SSRI but they prefer to wait for now B12 deficiency: See above Pulmonary fibrosis see above RTC 02-2017 as a schedule. Sooner if not improving

## 2016-12-04 LAB — URINALYSIS, ROUTINE W REFLEX MICROSCOPIC
BILIRUBIN URINE: NEGATIVE
GLUCOSE, UA: NEGATIVE
HGB URINE DIPSTICK: NEGATIVE
KETONES UR: NEGATIVE
Leukocytes, UA: NEGATIVE
Nitrite: NEGATIVE
PROTEIN: NEGATIVE
Specific Gravity, Urine: 1.016 (ref 1.001–1.03)
pH: 6 (ref 5.0–8.0)

## 2016-12-04 LAB — COMPREHENSIVE METABOLIC PANEL
AG RATIO: 0.9 (calc) — AB (ref 1.0–2.5)
ALBUMIN MSPROF: 3.3 g/dL — AB (ref 3.6–5.1)
ALT: 8 U/L — ABNORMAL LOW (ref 9–46)
AST: 16 U/L (ref 10–35)
Alkaline phosphatase (APISO): 141 U/L — ABNORMAL HIGH (ref 40–115)
BILIRUBIN TOTAL: 0.3 mg/dL (ref 0.2–1.2)
BUN: 15 mg/dL (ref 7–25)
CALCIUM: 8.6 mg/dL (ref 8.6–10.3)
CO2: 25 mmol/L (ref 20–32)
Chloride: 101 mmol/L (ref 98–110)
Creat: 0.8 mg/dL (ref 0.70–1.11)
Globulin: 3.7 g/dL (calc) (ref 1.9–3.7)
Glucose, Bld: 107 mg/dL — ABNORMAL HIGH (ref 65–99)
POTASSIUM: 3.4 mmol/L — AB (ref 3.5–5.3)
SODIUM: 140 mmol/L (ref 135–146)
TOTAL PROTEIN: 7 g/dL (ref 6.1–8.1)

## 2016-12-04 LAB — FOLATE: FOLATE: 18.2 ng/mL

## 2016-12-04 LAB — CBC WITH DIFFERENTIAL/PLATELET
BASOS ABS: 40 {cells}/uL (ref 0–200)
Basophils Relative: 0.6 %
EOS PCT: 1.2 %
Eosinophils Absolute: 80 cells/uL (ref 15–500)
HCT: 37.4 % — ABNORMAL LOW (ref 38.5–50.0)
Hemoglobin: 12.9 g/dL — ABNORMAL LOW (ref 13.2–17.1)
Lymphs Abs: 1380 cells/uL (ref 850–3900)
MCH: 33.2 pg — ABNORMAL HIGH (ref 27.0–33.0)
MCHC: 34.5 g/dL (ref 32.0–36.0)
MCV: 96.1 fL (ref 80.0–100.0)
MONOS PCT: 8.3 %
MPV: 12 fL (ref 7.5–12.5)
NEUTROS PCT: 69.3 %
Neutro Abs: 4643 cells/uL (ref 1500–7800)
PLATELETS: 197 10*3/uL (ref 140–400)
RBC: 3.89 10*6/uL — ABNORMAL LOW (ref 4.20–5.80)
RDW: 12.1 % (ref 11.0–15.0)
TOTAL LYMPHOCYTE: 20.6 %
WBC mixed population: 556 cells/uL (ref 200–950)
WBC: 6.7 10*3/uL (ref 3.8–10.8)

## 2016-12-04 LAB — VITAMIN D 1,25 DIHYDROXY

## 2016-12-04 LAB — VITAMIN B12: VITAMIN B 12: 401 pg/mL (ref 200–1100)

## 2016-12-12 DIAGNOSIS — J84112 Idiopathic pulmonary fibrosis: Secondary | ICD-10-CM | POA: Diagnosis not present

## 2016-12-12 DIAGNOSIS — J8410 Pulmonary fibrosis, unspecified: Secondary | ICD-10-CM | POA: Diagnosis not present

## 2016-12-16 ENCOUNTER — Other Ambulatory Visit: Payer: Self-pay | Admitting: Internal Medicine

## 2016-12-16 DIAGNOSIS — R69 Illness, unspecified: Secondary | ICD-10-CM | POA: Diagnosis not present

## 2016-12-30 ENCOUNTER — Telehealth: Payer: Self-pay | Admitting: Internal Medicine

## 2016-12-30 NOTE — Telephone Encounter (Signed)
Immunization record updated.

## 2016-12-30 NOTE — Telephone Encounter (Signed)
Caller name:Gellis,Billie Relation to JL:[id number]  Call back [phone]   Reason for call:   Patient received flu shot 12/16/16 at   [id number] - [place], [place] [phone] (Phone) [phone] (Fax)

## 2017-01-02 ENCOUNTER — Telehealth: Payer: Self-pay

## 2017-01-02 ENCOUNTER — Ambulatory Visit: Payer: Medicare HMO | Admitting: Internal Medicine

## 2017-01-02 MED ORDER — MESALAMINE ER 250 MG PO CPCR: 500.0000 mg | ORAL_CAPSULE | Freq: Two times a day (BID) | ORAL | 0 refills | Status: DC

## 2017-01-02 NOTE — Telephone Encounter (Signed)
Per PCP, okay to send 90 day of Pentasa to [facility]- Pt to see GI for further refills.

## 2017-01-05 ENCOUNTER — Telehealth: Payer: Self-pay | Admitting: Gastroenterology

## 2017-01-05 MED ORDER — MESALAMINE ER 250 MG PO CPCR: 500.0000 mg | ORAL_CAPSULE | Freq: Two times a day (BID) | ORAL | 0 refills | Status: DC

## 2017-01-05 NOTE — Telephone Encounter (Signed)
Informed patient that I sent his prescription for Pentasa to [facility] but to keep his appt for further refills. Patient verbalized understanding.

## 2017-01-05 NOTE — Telephone Encounter (Signed)
OK to refill until Dec REV

## 2017-01-05 NOTE — Telephone Encounter (Signed)
Last office appointment with you was on 02/2015. Dr. Larose Kells has been filling the mesalamine prescription. Patient would like [place] to refill this medication until scheduled appt on 02/23/17. Can we refill?

## 2017-01-11 DIAGNOSIS — J84112 Idiopathic pulmonary fibrosis: Secondary | ICD-10-CM | POA: Diagnosis not present

## 2017-01-11 DIAGNOSIS — J8410 Pulmonary fibrosis, unspecified: Secondary | ICD-10-CM | POA: Diagnosis not present

## 2017-01-14 DIAGNOSIS — H26491 Other secondary cataract, right eye: Secondary | ICD-10-CM | POA: Diagnosis not present

## 2017-01-14 DIAGNOSIS — H40013 Open angle with borderline findings, low risk, bilateral: Secondary | ICD-10-CM | POA: Diagnosis not present

## 2017-01-14 DIAGNOSIS — H35363 Drusen (degenerative) of macula, bilateral: Secondary | ICD-10-CM | POA: Diagnosis not present

## 2017-01-14 DIAGNOSIS — Z961 Presence of intraocular lens: Secondary | ICD-10-CM | POA: Diagnosis not present

## 2017-02-02 DIAGNOSIS — R0602 Shortness of breath: Secondary | ICD-10-CM | POA: Diagnosis not present

## 2017-02-02 DIAGNOSIS — J84112 Idiopathic pulmonary fibrosis: Secondary | ICD-10-CM | POA: Diagnosis not present

## 2017-02-02 DIAGNOSIS — J439 Emphysema, unspecified: Secondary | ICD-10-CM | POA: Diagnosis not present

## 2017-02-02 DIAGNOSIS — Z79899 Other long term (current) drug therapy: Secondary | ICD-10-CM | POA: Diagnosis not present

## 2017-02-02 DIAGNOSIS — Z87891 Personal history of nicotine dependence: Secondary | ICD-10-CM | POA: Diagnosis not present

## 2017-02-11 DIAGNOSIS — J8410 Pulmonary fibrosis, unspecified: Secondary | ICD-10-CM | POA: Diagnosis not present

## 2017-02-11 DIAGNOSIS — J84112 Idiopathic pulmonary fibrosis: Secondary | ICD-10-CM | POA: Diagnosis not present

## 2017-02-21 DEATH — deceased

## 2017-02-23 ENCOUNTER — Ambulatory Visit (INDEPENDENT_AMBULATORY_CARE_PROVIDER_SITE_OTHER): Payer: Medicare HMO | Admitting: Gastroenterology

## 2017-02-23 ENCOUNTER — Encounter: Payer: Self-pay | Admitting: Gastroenterology

## 2017-02-23 VITALS — BP 126/74 | HR 88 | Ht 70.0 in | Wt 135.4 lb

## 2017-02-23 DIAGNOSIS — R197 Diarrhea, unspecified: Secondary | ICD-10-CM | POA: Diagnosis not present

## 2017-02-23 DIAGNOSIS — K5080 Crohn's disease of both small and large intestine without complications: Secondary | ICD-10-CM

## 2017-02-23 MED ORDER — MESALAMINE ER 250 MG PO CPCR: 1000.0000 mg | ORAL_CAPSULE | Freq: Two times a day (BID) | ORAL | 3 refills | Status: DC

## 2017-02-23 NOTE — Patient Instructions (Signed)
We have sent the following medications to your pharmacy for you to pick up at your convenience: Pentasa.  Thank you for choosing me and [facility].  Malcolm T. Stark, Jr., MD., FACG    

## 2017-02-23 NOTE — Progress Notes (Signed)
    History of Present Illness: This is an 83 year old male with Crohn's ileocolitis.  He is accompanied by his wife.  His swallowing improved and feeding tube was removed about 1 year ago.  Infrequently he has mild diarrhea that is controlled with Imodium as needed.  He states his appetite is good and his weight is stable.  CMP, CBC TSH from September and July reviewed.  He denies rectal bleeding, abdominal pain.  Current Medications, Allergies, Past Medical History, Past Surgical History, Family History and Social History were reviewed in [employer] record.  Physical Exam: General: Well developed, thin, elderly, frail, on O2 by [place], no acute distress Head: Normocephalic and atraumatic Eyes:  sclerae anicteric, EOMI Ears: Normal auditory acuity Mouth: No deformity or lesions Lungs: Clear throughout to auscultation Heart: Regular rate and rhythm; no murmurs, rubs or bruits Abdomen: Soft, non tender and non distended. No masses, hepatosplenomegaly or hernias noted. Normal Bowel sounds Musculoskeletal: Symmetrical with no gross deformities  Pulses:  Normal pulses noted Extremities: No clubbing, cyanosis, edema or deformities noted Neurological: Alert oriented x 4, grossly nonfocal Psychological:  Alert and cooperative. Normal mood and affect  Assessment and Recommendations:  1. Crohn's ileocolitis. Intermittent diarrhea.  Continue Pentasa 1 g twice daily.  Imodium 3 times daily as needed.  REV in 1 year.   2. IPF followed at [facility].

## 2017-03-10 ENCOUNTER — Ambulatory Visit (INDEPENDENT_AMBULATORY_CARE_PROVIDER_SITE_OTHER): Payer: Medicare HMO | Admitting: Pharmacist

## 2017-03-10 VITALS — Wt 135.5 lb

## 2017-03-10 DIAGNOSIS — I4891 Unspecified atrial fibrillation: Secondary | ICD-10-CM | POA: Diagnosis not present

## 2017-03-10 LAB — CBC
HEMATOCRIT: 39.2 % (ref 37.5–51.0)
Hemoglobin: 13.6 g/dL (ref 13.0–17.7)
MCH: 33 pg (ref 26.6–33.0)
MCHC: 34.7 g/dL (ref 31.5–35.7)
MCV: 95 fL (ref 79–97)
PLATELETS: 200 10*3/uL (ref 150–379)
RBC: 4.12 x10E6/uL — ABNORMAL LOW (ref 4.14–5.80)
RDW: 13.1 % (ref 12.3–15.4)
WBC: 7.2 10*3/uL (ref 3.4–10.8)

## 2017-03-10 LAB — BASIC METABOLIC PANEL
BUN / CREAT RATIO: 21 (ref 10–24)
BUN: 15 mg/dL (ref 8–27)
CO2: 26 mmol/L (ref 20–29)
CREATININE: 0.73 mg/dL — AB (ref 0.76–1.27)
Calcium: 8.9 mg/dL (ref 8.6–10.2)
Chloride: 101 mmol/L (ref 96–106)
GFR calc non Af Amer: 84 mL/min/{1.73_m2} (ref >59)
GFR, EST AFRICAN AMERICAN: 97 mL/min/{1.73_m2} (ref >59)
Glucose: 95 mg/dL (ref 65–99)
Potassium: 3.4 mmol/L — ABNORMAL LOW (ref 3.5–5.2)
SODIUM: 142 mmol/L (ref 134–144)

## 2017-03-10 NOTE — Patient Instructions (Signed)
We will call you with the results of your blood work and to schedule follow up if needed.

## 2017-03-10 NOTE — Progress Notes (Signed)
Pt was started on Eliquis 5mg  BID for Afib on 01/31/2015.    Reviewed patients medication list.  Pt is not currently on any combined P-gp and strong CYP3A4 inhibitors/inducers (ketoconazole, traconazole, ritonavir, carbamazepine, phenytoin, rifampin, St. John's wort).  Reviewed labs.  SCr 0.73, Weight 61kg, CrCl- 50ml/min .  Dose is appropriate based on age, weight, and SCr.  Hgb and HCT WNL.   A full discussion of the nature of anticoagulants has been carried out.  A benefit/risk analysis has been presented to the patient, so that they understand the justification for choosing anticoagulation with Eliquis at this time.  The need for compliance is stressed.  Pt is aware to take the medication twice daily.  Side effects of potential bleeding are discussed, including unusual colored urine or stools, coughing up blood or coffee ground emesis, nose bleeds or serious fall or head trauma.  Discussed signs and symptoms of stroke. The patient should avoid any OTC items containing aspirin or ibuprofen.  Avoid alcohol consumption.   Call if any signs of abnormal bleeding.  Discussed financial obligations and resolved any difficulty in obtaining medication.  Spoke with patient's wife and made aware of results and to increase potassium intake in foods. She will try to have him eat more bananas, baked potatoes and green leafy vegetables.

## 2017-03-11 ENCOUNTER — Encounter: Payer: Self-pay | Admitting: Internal Medicine

## 2017-03-11 ENCOUNTER — Ambulatory Visit (INDEPENDENT_AMBULATORY_CARE_PROVIDER_SITE_OTHER): Payer: Medicare HMO | Admitting: Internal Medicine

## 2017-03-11 VITALS — BP 124/64 | HR 80 | Temp 98.0°F | Resp 14 | Ht 70.0 in | Wt 133.5 lb

## 2017-03-11 DIAGNOSIS — J8410 Pulmonary fibrosis, unspecified: Secondary | ICD-10-CM

## 2017-03-11 DIAGNOSIS — I10 Essential (primary) hypertension: Secondary | ICD-10-CM | POA: Diagnosis not present

## 2017-03-11 DIAGNOSIS — Z23 Encounter for immunization: Secondary | ICD-10-CM | POA: Diagnosis not present

## 2017-03-11 DIAGNOSIS — R627 Adult failure to thrive: Secondary | ICD-10-CM | POA: Diagnosis not present

## 2017-03-11 NOTE — Patient Instructions (Signed)
GO TO THE FRONT DESK Schedule your next appointment for a  Check up in 6 months, fasting

## 2017-03-11 NOTE — Progress Notes (Signed)
Subjective:    Patient ID: Brian [name], male    DOB: 07-May-1928, 83 y.o.   MRN: [id number]  DOS:  03/11/2017 Type of visit - description : rov Interval history: Was seen last with failure to thrive, he quickly went back to normal and currently is doing well. Notes from pulmonary reviewed Good compliance with medication. Previous labs reviewed.   Review of Systems Depression: Not an issue at this point Denies fever chills, no major problems with cough.  No lower extremity edema He has episodic diarrhea described as loose stools, 1 or 2 times a week, no associated with blood or stomach pain.  Otherwise BMs are normal.  Past Medical History:  Diagnosis Date  . Anxiety   . Atrial fibrillation with RVR ([place])   . B12 deficiency anemia   . CAP (community acquired pneumonia) 01/26/2015  . COPD (chronic obstructive pulmonary disease) ([place])    recent dx--no acute problems  . Crohn's ileocolitis ([place])   . Depression   . ED (erectile dysfunction)   . Emphysema lung ([place]) 2016   Dr. [name]  . Hemorrhoids   . Hiatal hernia 1956   found while in service  . Hypertension 11/11   mild  . Hypertriglyceridemia   . Hypertrophy of prostate    w/o UR obst & oth luts  . Idiopathic pulmonary fibrosis ([place]) 2016   Dr. [name]  . Long term (current) use of anticoagulants 02/21/2011  . Melanoma of scalp ([place])   . Muscle tremor    saw neurology remotely elsewhere, was Rx inderal  . On home oxygen therapy    "2L; 20-24h for the past week" (01/26/2015)  . Osteoarthritis    "maybe in my hands, feet" (01/26/2015)  . Osteopenia    per DEXA 11/2008  . Peripheral neuropathy    h/o neg w/u  . Pneumonia ?2015  . Pulmonary fibrosis ([place]) 2015   Rx Esbriet ~ 10-2013 ([name]), Dr. [name]  . Renal cyst    bilateral  . Serrated adenoma of colon 03/2010  . Squamous cell carcinoma of skin of scalp    tx'd ~ 28yrs; "froze them off"  . Squamous cell carcinoma, face     "froze them off"  . Stroke [facility])       Past Surgical History:  Procedure Laterality Date  . APPENDECTOMY  1984  . CATARACT EXTRACTION W/ INTRAOCULAR LENS  IMPLANT, BILATERAL Bilateral 2007  . CHOLECYSTECTOMY  02/17/2011   Procedure: LAPAROSCOPIC CHOLECYSTECTOMY WITH INTRAOPERATIVE CHOLANGIOGRAM;  Surgeon: Edward Jolly, MD;  Location: [facility];  Service: General;  Laterality: N/A;  . COLON RESECTION  1984   resection of distal ileum and cecum w/ appendectomy, 18-inch small intestine, for Crohn's disease  . COLON SURGERY    . IR GENERIC HISTORICAL  05/29/2016   IR GASTROSTOMY TUBE REMOVAL 05/29/2016 Sandi Mariscal, MD [facility]  . MOHS SURGERY Right ~ 2014   "side of my scalp"    Social History   Socioeconomic History  . Marital status: Married    Spouse name: Dalene Seltzer  . Number of children: 4  . Years of education: Not on file  . Highest education level: Not on file  Social Needs  . Financial resource strain: Not on file  . Food insecurity - worry: Not on file  . Food insecurity - inability: Not on file  . Transportation needs - medical: Not on file  . Transportation needs - non-medical: Not on file  Occupational History  . Occupation: Retired  .  Occupation: RETIRED    Employer: RETIRED  Tobacco Use  . Smoking status: Former Smoker    Packs/day: 1.00    Years: 40.00    Pack years: 40.00    Types: Cigarettes, Pipe    Last attempt to quit: 03/24/1990    Years since quitting: 26.9  . Smokeless tobacco: Never Used  Substance and Sexual Activity  . Alcohol use: No    Alcohol/week: 0.0 oz    Comment:    . Drug use: No  . Sexual activity: No    Birth control/protection: None  Other Topics Concern  . Not on file  Social History Narrative   Married, lives w/ wife; son [name] is a Restaurant manager, fast food, lives in [place], checks on [place] frequently     Wife drives       Allergies as of 03/11/2017   No Known Allergies     Medication List        Accurate as of 03/11/17 11:59 PM. Always use your most recent med list.           apixaban 5 MG Tabs tablet Commonly known as:  ELIQUIS Take 1 tablet (5 mg total) by mouth 2 (two) times daily.   busPIRone 15 MG tablet Commonly known as:  BUSPAR Take 1 tablet (15 mg total) by mouth 2 (two) times daily.   clonazePAM 0.5 MG tablet Commonly known as:  KLONOPIN Take 0.5 tablets (0.25 mg total) by mouth 3 (three) times daily as needed for anxiety.   Cyanocobalamin 500 MCG/0.1ML Soln Commonly known as:  NASCOBAL Use 1 spray nasally per week as directed.   DAILY VITAMIN PO Take by mouth.   ergocalciferol 8000 UNIT/ML drops Commonly known as:  DRISDOL Place 1 mL (8,000 Units total) into feeding tube 3 (three) times a week.   fluticasone 50 MCG/ACT nasal spray Commonly known as:  FLONASE Place into the nose. Place 2 sprays into both nostrils continuously as needed for Rhinitis.   gemfibrozil 600 MG tablet Commonly known as:  LOPID Take 1 tablet (600 mg total) by mouth 2 (two) times daily.   hydrocortisone 2.5 % cream Apply topically 2 (two) times daily.   loperamide 1 MG/5ML solution Commonly known as:  IMODIUM Place 10 mLs (2 mg total) into feeding tube as needed for diarrhea or loose stools.   mesalamine 250 MG CR capsule Commonly known as:  PENTASA Take 4 capsules (1,000 mg total) by mouth 2 (two) times daily.   nebivolol 5 MG tablet Commonly known as:  BYSTOLIC Take 1 tablet (5 mg total) by mouth daily.   OXYGEN Inhale into the lungs continuous. 2 L   Pirfenidone 267 MG Caps Take 3 capsules by mouth 3 (three) times daily. Put in feeding tube   pravastatin 10 MG tablet Commonly known as:  PRAVACHOL Take 1 tablet (10 mg total) by mouth daily. Take 1 tablet into feeding tube daily   PROBIOTIC DAILY PO Take 1 tablet by mouth daily.          Objective:   Physical Exam BP 124/64 (BP Location: Right Arm, Patient Position: Sitting, Cuff Size: Small)   Pulse 80   Temp 98 F (36.7 C) (Oral)   Resp 14   Ht 5\' 10"  (1.778 m)   Wt  133 lb 8 oz (60.6 kg)   SpO2 95%   BMI 19.16 kg/m  General:   Well developed, slightly underweight appearing, older gentleman in no distress.Marland Kitchen  HEENT:  Normocephalic . Face symmetric, atraumatic Lungs:  Decreased breath sounds, question of  few dry crackles at bases  Normal respiratory effort, no intercostal retractions, no accessory muscle use. Heart: RRR,  no murmur.  No pretibial edema bilaterally  Skin: Not pale. Not jaundice Neurologic:  Speech normal, gait appropriate for age  Psych--  Behavior appropriate. No anxious or depressed appearing.      Assessment & Plan:   Assessment   HTN Hypertriglyceridemia Anxiety, on chronic Buspar , sx controlled  Depression 01-2015 after lost a brother  Atrial fibrillation anticoagulated CVA 01-2015: Dysphagia, aphasia, R weakness,  PANDA d/c 05-2016  Pulmonary: --Pulmonary fibrosis, UIP pattern, home oxygen (2 lt, 4 lt w/ exertion )   --Emphysema Tremors Crohn's colitis DJD Osteopenia DEXA 2010, DEXA 03-2014 T score -2.2, on calcium and vitamin D BPH Peripheral  neuropathy previous workup negative Skin cancer SCC B12 deficiency -- nascobal  H/o palpable abdominal aorta --- CT of the abdomen 06/2013 no AAA  PLAN: FTT:: See last visit, w/u was  Essentially wnl, he took abx for question of a lung infex, quickly went back to normal.  Currently doing well. H/o  HTN: on no medication, last  BMP satisfactory except for slightly low potassium Depression, anxiety: Not an issue at this point, will control. Pulmonary fibrosis: Doing well, on oxygen.  6 minutes walk test 01-2017 very decreased.    Atrial fibrillation: Seems regular today, anticoagulated. Preventive care: PNM 23 booster today, had shingrix  #1 07/2016, has been unable to get #2. RTC 6 months

## 2017-03-11 NOTE — Progress Notes (Signed)
Pre visit review using our clinic review tool, if applicable. No additional management support is needed unless otherwise documented below in the visit note. 

## 2017-03-12 NOTE — Assessment & Plan Note (Signed)
FTT:: See last visit, w/u was  Essentially wnl, he took abx for question of a lung infex, quickly went back to normal.  Currently doing well. H/o  HTN: on no medication, last  BMP satisfactory except for slightly low potassium Depression, anxiety: Not an issue at this point, will control. Pulmonary fibrosis: Doing well, on oxygen.  6 minutes walk test 01-2017 very decreased.    Atrial fibrillation: Seems regular today, anticoagulated. Preventive care: PNM 23 booster today, had shingrix  #1 07/2016, has been unable to get #2. RTC 6 months

## 2017-03-13 DIAGNOSIS — J84112 Idiopathic pulmonary fibrosis: Secondary | ICD-10-CM | POA: Diagnosis not present

## 2017-03-13 DIAGNOSIS — J8410 Pulmonary fibrosis, unspecified: Secondary | ICD-10-CM | POA: Diagnosis not present

## 2017-04-05 ENCOUNTER — Other Ambulatory Visit: Payer: Self-pay | Admitting: Internal Medicine

## 2017-04-13 DIAGNOSIS — J8410 Pulmonary fibrosis, unspecified: Secondary | ICD-10-CM | POA: Diagnosis not present

## 2017-04-13 DIAGNOSIS — J84112 Idiopathic pulmonary fibrosis: Secondary | ICD-10-CM | POA: Diagnosis not present

## 2017-04-17 ENCOUNTER — Telehealth: Payer: Self-pay | Admitting: *Deleted

## 2017-04-17 NOTE — Telephone Encounter (Signed)
Received Physician Orders from [facility] for recertification by Physician's Verification; forwarded to provider/SLS 01/25

## 2017-04-23 NOTE — Telephone Encounter (Signed)
Form completed and faxed to [facility] at [phone]. Form sent for scanning.

## 2017-05-12 ENCOUNTER — Telehealth: Payer: Self-pay | Admitting: Internal Medicine

## 2017-05-12 MED ORDER — ERGOCALCIFEROL 8000 UNIT/ML PO SOLN: 8000.0000 [IU] | ORAL | 1 refills | Status: DC

## 2017-05-12 NOTE — Telephone Encounter (Signed)
Refill request for Vitamin D Drisdol 8000 units expired on 02/19/17  LOV  03/11/17  NOV   09/08/17  Please review.

## 2017-05-12 NOTE — Telephone Encounter (Signed)
Copied from [place] [phone]. Topic: Quick Communication - Rx Refill/Question >> May 12, 2017 10:22 AM Marin Olp L wrote: Medication: Vitamin D  Has the patient contacted their pharmacy? Yes.   (Agent: If no, request that the patient contact the pharmacy for the refill.) Preferred Pharmacy (with phone number or street name): [id number] [place], [place] - [address] [place] [address] Phone: [phone] Fax: [phone] Agent: Please be advised that RX refills may take up to 3 business days. We ask that you follow-up with your pharmacy. Patient unsure of the dose.

## 2017-05-12 NOTE — Telephone Encounter (Signed)
Patient has difficulty swallowing thus need a liquid prescription. Advised patient:  Brian [name] to go back on liquid vitamin D, RX Sent, will do only 3 times a week which is a still high dose. Recheck vitamin D levels on RTC.

## 2017-05-12 NOTE — Telephone Encounter (Signed)
Please advise 

## 2017-05-14 DIAGNOSIS — J8410 Pulmonary fibrosis, unspecified: Secondary | ICD-10-CM | POA: Diagnosis not present

## 2017-05-14 DIAGNOSIS — J84112 Idiopathic pulmonary fibrosis: Secondary | ICD-10-CM | POA: Diagnosis not present

## 2017-05-26 ENCOUNTER — Telehealth: Payer: Self-pay

## 2017-05-26 NOTE — Telephone Encounter (Signed)
Copied from [place]. Topic: Inquiry >> May 25, 2017  6:29 PM Brian [name] wrote: Reason for CRM: pt's wife called and is concerned about the pt's diarrhea, its getting out of control but pt is starting to drink fluids again and eating, but pt's wife is concerned and is wanted to know if a medication could be called in, contact pt to advise

## 2017-05-26 NOTE — Telephone Encounter (Signed)
Please advise 

## 2017-05-26 NOTE — Telephone Encounter (Addendum)
Spoke with the patient's wife: Had diarrhea for 2 days, is better today. He did not have fever, chills, nausea, vomiting, blood in the stools. Appetite is good. The wife herself did have some loose stools. Plan: Continue regular medications, encourage good hydration, okay to use Imodium as needed and call if symptoms resurface.  Most likely he had a self-limited diarrhea.

## 2017-06-11 DIAGNOSIS — J84112 Idiopathic pulmonary fibrosis: Secondary | ICD-10-CM | POA: Diagnosis not present

## 2017-06-11 DIAGNOSIS — J8410 Pulmonary fibrosis, unspecified: Secondary | ICD-10-CM | POA: Diagnosis not present

## 2017-06-14 ENCOUNTER — Other Ambulatory Visit: Payer: Self-pay | Admitting: Internal Medicine

## 2017-06-22 ENCOUNTER — Other Ambulatory Visit: Payer: Self-pay | Admitting: Cardiovascular Disease

## 2017-06-22 DIAGNOSIS — I482 Chronic atrial fibrillation, unspecified: Secondary | ICD-10-CM

## 2017-06-22 MED ORDER — NEBIVOLOL HCL 5 MG PO TABS: 5.0000 mg | ORAL_TABLET | Freq: Every day | ORAL | 0 refills | Status: DC

## 2017-06-28 ENCOUNTER — Other Ambulatory Visit: Payer: Self-pay | Admitting: Internal Medicine

## 2017-06-30 ENCOUNTER — Telehealth: Payer: Self-pay | Admitting: Cardiovascular Disease

## 2017-06-30 DIAGNOSIS — I482 Chronic atrial fibrillation, unspecified: Secondary | ICD-10-CM

## 2017-06-30 MED ORDER — NEBIVOLOL HCL 5 MG PO TABS: 5.0000 mg | ORAL_TABLET | Freq: Every day | ORAL | 0 refills | Status: DC

## 2017-06-30 NOTE — Progress Notes (Addendum)
Subjective:   Brian [name] is a 84 y.o. male who presents for Medicare Annual/Subsequent preventive examination.  Review of Systems: No ROS.  Medicare Wellness Visit. Additional risk factors are reflected in the social history.  Cardiac Risk Factors include: advanced age (>38men, >26 women);dyslipidemia;hypertension;male gender Sleep patterns: sleeps very well per pt Home Safety/Smoke Alarms: Feels safe in home. Smoke alarms in place.  Living environment; residence and Firearm Safety: 1 story home with wife.   Male:   CCS-  Last 2012 with 5 yr recall   PSA-  Lab Results  Component Value Date   PSA 0.74 01/07/2013   PSA 0.89 12/23/2010   PSA 0.49 12/10/2009       Objective:    Vitals: BP 124/76 (BP Location: Left Arm, Patient Position: Sitting, Cuff Size: Normal)   Pulse 81   Ht 5\' 10"  (1.778 m)   Wt 129 lb 9.6 oz (58.8 kg)   SpO2 98%   BMI 18.60 kg/m   Body mass index is 18.6 kg/m.  Advanced Directives 07/02/2017 06/25/2016 05/16/2016 04/15/2016 07/27/2015 07/23/2015 07/20/2015  Does Patient Have a Medical Advance Directive? Yes Yes Yes Yes Yes Yes Yes  Type of Paramedic of [place];Living will [place];Living will Healthcare Power of [place];Living will [place];Living will [place];Living will  Does patient want to make changes to medical advance directive? - - - - No - Patient declined No - Patient declined No - Patient declined  Copy of [place] in Chart? No - copy requested No - copy requested - Yes No - copy requested No - copy requested No - copy requested  Would patient like information on creating a medical advance directive? - - - - - - -  Pre-existing out of facility DNR order (yellow form or pink MOST form) - - - - - - -    Tobacco Social History   Tobacco Use  Smoking Status Former Smoker  .  Packs/day: 1.00  . Years: 40.00  . Pack years: 40.00  . Types: Cigarettes, Pipe  . Last attempt to quit: 03/24/1990  . Years since quitting: 27.2  Smokeless Tobacco Never Used     Counseling given: Not Answered   Clinical Intake:     Pain : No/denies pain                 Past Medical History:  Diagnosis Date  . Anxiety   . Atrial fibrillation with RVR ([place])   . B12 deficiency anemia   . CAP (community acquired pneumonia) 01/26/2015  . COPD (chronic obstructive pulmonary disease) ([place])    recent dx--no acute problems  . Crohn's ileocolitis ([place])   . Depression   . ED (erectile dysfunction)   . Emphysema lung ([place]) 2016   Dr. [name]  . Hemorrhoids   . Hiatal hernia 1956   found while in service  . Hypertension 11/11   mild  . Hypertriglyceridemia   . Hypertrophy of prostate    w/o UR obst & oth luts  . Idiopathic pulmonary fibrosis ([place]) 2016   Dr. [name]  . Long term (current) use of anticoagulants 02/21/2011  . Melanoma of scalp ([place])   . Muscle tremor    saw neurology remotely elsewhere, was Rx inderal  . On home oxygen therapy    "2L; 20-24h for the past week" (01/26/2015)  . Osteoarthritis    "maybe  in my hands, feet" (01/26/2015)  . Osteopenia    per DEXA 11/2008  . Peripheral neuropathy    h/o neg w/u  . Pneumonia ?2015  . Pulmonary fibrosis ([place]) 2015   Rx Esbriet ~ 10-2013 ([name]), Dr. [name]  . Renal cyst    bilateral  . Serrated adenoma of colon 03/2010  . Squamous cell carcinoma of skin of scalp    tx'd ~ 70yrs; "froze them off"  . Squamous cell carcinoma, face     "froze them off"  . Stroke [facility])    Past Surgical History:  Procedure Laterality Date  . APPENDECTOMY  1984  . CATARACT EXTRACTION W/ INTRAOCULAR LENS  IMPLANT, BILATERAL Bilateral 2007  . CHOLECYSTECTOMY  02/17/2011   Procedure: LAPAROSCOPIC CHOLECYSTECTOMY WITH INTRAOPERATIVE CHOLANGIOGRAM;  Surgeon: Edward Jolly, MD;  Location: [facility] ORS;  Service: General;   Laterality: N/A;  . COLON RESECTION  1984   resection of distal ileum and cecum w/ appendectomy, 18-inch small intestine, for Crohn's disease  . COLON SURGERY    . IR GENERIC HISTORICAL  05/29/2016   IR GASTROSTOMY TUBE REMOVAL 05/29/2016 Sandi Mariscal, MD [facility]-INTER[facility] RAD  . MOHS SURGERY Right ~ 2014   "side of my scalp"   Family History  Problem Relation Age of Onset  . Breast cancer Mother   . Stroke Mother 80  . Heart attack Father 20  . Crohn's disease Brother   . Dementia Sister   . Diabetes Sister   . Colon cancer Neg Hx   . Prostate cancer Neg Hx    Social History   Socioeconomic History  . Marital status: Married    Spouse name: Dalene Seltzer  . Number of children: 4  . Years of education: Not on file  . Highest education level: Not on file  Occupational History  . Occupation: Retired  . Occupation: RETIRED    Employer: RETIRED  Social Needs  . Financial resource strain: Not on file  . Food insecurity:    Worry: Not on file    Inability: Not on file  . Transportation needs:    Medical: Not on file    Non-medical: Not on file  Tobacco Use  . Smoking status: Former Smoker    Packs/day: 1.00    Years: 40.00    Pack years: 40.00    Types: Cigarettes, Pipe    Last attempt to quit: 03/24/1990    Years since quitting: 27.2  . Smokeless tobacco: Never Used  Substance and Sexual Activity  . Alcohol use: No    Alcohol/week: 0.0 oz    Comment:    . Drug use: No  . Sexual activity: Never    Birth control/protection: None  Lifestyle  . Physical activity:    Days per week: Not on file    Minutes per session: Not on file  . Stress: Not on file  Relationships  . Social connections:    Talks on phone: Not on file    Gets together: Not on file    Attends religious service: Not on file    Active member of club or organization: Not on file    Attends meetings of clubs or organizations: Not on file    Relationship status: Not on file  Other Topics Concern  . Not on file    Social History Narrative   Married, lives w/ wife; son [name] is a Restaurant manager, fast food, lives in [place], checks on [place] frequently     Wife drives     Outpatient Encounter  Medications as of 07/02/2017  Medication Sig  . apixaban (ELIQUIS) 5 MG TABS tablet Take 1 tablet (5 mg total) by mouth 2 (two) times daily.  . busPIRone (BUSPAR) 15 MG tablet Take 1 tablet (15 mg total) by mouth 2 (two) times daily.  . Cyanocobalamin (NASCOBAL) 500 MCG/0.1ML SOLN Use 1 spray nasally per week as directed.  . ergocalciferol (DRISDOL) 8000 UNIT/ML drops Take 1 mL (8,000 Units total) by mouth 3 (three) times a week.  Marland Kitchen gemfibrozil (LOPID) 600 MG tablet Take 1 tablet (600 mg total) by mouth 2 (two) times daily.  Marland Kitchen loperamide (IMODIUM) 1 MG/5ML solution Place 10 mLs (2 mg total) into feeding tube as needed for diarrhea or loose stools.  . mesalamine (PENTASA) 250 MG CR capsule Take 4 capsules (1,000 mg total) by mouth 2 (two) times daily.  . Multiple Vitamin (DAILY VITAMIN PO) Take by mouth.  . nebivolol (BYSTOLIC) 5 MG tablet Take 1 tablet (5 mg total) by mouth daily. Please make overdue appt with Dr. Johnsie Cancel before anymore refills. 1st attempt  . OXYGEN Inhale into the lungs continuous. 2 L  . Pirfenidone 267 MG CAPS Take 3 capsules by mouth 3 (three) times daily. Put in feeding tube (Patient taking differently: Take 3 capsules by mouth 3 (three) times daily. Put in feeding tube)  . pravastatin (PRAVACHOL) 10 MG tablet Take 1 tablet (10 mg total) by mouth daily.  . Probiotic Product (PROBIOTIC DAILY PO) Take 1 tablet by mouth daily.  . clonazePAM (KLONOPIN) 0.5 MG tablet Take 0.5 tablets (0.25 mg total) by mouth 3 (three) times daily as needed for anxiety. (Patient not taking: Reported on 07/02/2017)  . fluticasone (FLONASE) 50 MCG/ACT nasal spray Place into the nose. Place 2 sprays into both nostrils continuously as needed for Rhinitis.  . hydrocortisone 2.5 % cream Apply topically 2 (two) times daily. (Patient not taking:  Reported on 07/02/2017)   No facility-administered encounter medications on file as of 07/02/2017.     Activities of Daily Living In your present state of health, do you have any difficulty performing the following activities: 07/02/2017  Hearing? N  Vision? N  Comment wears glasses. hx cataract sx.  Difficulty concentrating or making decisions? Y  Comment difficulty since stroke 2016  Walking or climbing stairs? Y  Comment uses O2  Dressing or bathing? N  Doing errands, shopping? Y  Comment wife drives.  Preparing Food and eating ? N  Using the Toilet? N  In the past six months, have you accidently leaked urine? N  Do you have problems with loss of bowel control? N  Comment takes immodium. hx crohns  Managing your Medications? Y  Comment wife  Managing your Finances? Y  Comment wife  Housekeeping or managing your Housekeeping? Y  Comment wife  Some recent data might be hidden    Patient Care Team: Colon Branch, MD as PCP - General Tanda Rockers, MD as Consulting Physician (Pulmonary Disease) Ladene Artist, MD as Consulting Physician (Gastroenterology) Larey Dresser, MD as Consulting Physician (Cardiology) Donnald Garre, MD as Referring Physician (Internal Medicine) Monna Fam, MD as Consulting Physician (Ophthalmology)   Assessment:   This is a routine wellness examination for Brian [name]. Physical assessment deferred to PCP.  Exercise Activities and Dietary recommendations Current Exercise Habits: The patient does not participate in regular exercise at present, Exercise limited by: respiratory conditions(s)(walks alot. uses fitbit) Diet (meal preparation, eat out, water intake, caffeinated beverages, dairy products, fruits and vegetables): well  balanced   Goals    None      Fall Risk Fall Risk  07/02/2017 08/05/2016 06/25/2016 05/16/2016 02/20/2016  Falls in the past year? No No No No No     Depression Screen PHQ 2/9 Scores 07/02/2017 06/25/2016 02/20/2016  11/07/2015  PHQ - 2 Score 0 0 0 0    Cognitive Function MMSE - Mini Mental State Exam 07/02/2017 06/25/2016  Not completed: Unable to complete Unable to complete        Immunization History  Administered Date(s) Administered  . Influenza Split 12/15/2013  . Influenza Whole 12/23/1998, 01/07/2010, 01/28/2011  . Influenza, High Dose Seasonal PF 01/04/2013, 01/04/2013  . Influenza-Unspecified 01/05/2015, 11/25/2015, 12/16/2016  . Pneumococcal Conjugate-13 11/07/2015  . Pneumococcal Polysaccharide-23 06/27/2002, 12/02/2007, 03/11/2017  . Td 08/16/1998, 12/06/2008  . Tdap 12/23/2010  . Zoster 02/07/2012  . Zoster Recombinat (Shingrix) 08/20/2016    Screening Tests Health Maintenance  Topic Date Due  . INFLUENZA VACCINE  10/22/2017  . TETANUS/TDAP  12/22/2020  . PNA vac Low Risk Adult  Completed      Plan:   Follow up with Dr.Paz 09/08/17.  Continue to eat heart healthy diet (full of fruits, vegetables, whole grains, lean protein, water--limit salt, fat, and sugar intake) and increase physical activity as tolerated.   Continue doing brain stimulating activities (puzzles, reading, adult coloring books, staying active) to keep memory sharp.   Bring a copy of your living will and/or healthcare power of attorney to your next office visit.    I have personally reviewed and noted the following in the patient's chart:   . Medical and social history . Use of alcohol, tobacco or illicit drugs  . Current medications and supplements . Functional ability and status . Nutritional status . Physical activity . Advanced directives . List of other physicians . Hospitalizations, surgeries, and ER visits in previous 12 months . Vitals . Screenings to include cognitive, depression, and falls . Referrals and appointments  In addition, I have reviewed and discussed with patient certain preventive protocols, quality metrics, and best practice recommendations. A written personalized care plan  for preventive services as well as general preventive health recommendations were provided to patient.     [name] [place], [place]  07/02/2017  [name], MD

## 2017-06-30 NOTE — Telephone Encounter (Signed)
New message      *STAT* If patient is at the pharmacy, call can be transferred to refill team.   1. Which medications need to be refilled? (please list name of each medication and dose if known)   nebivolol (BYSTOLIC) 5 MG tablet Take 1 tablet (5 mg total) by mouth daily. Please make overdue appt with Dr. Johnsie Cancel before anymore refills. 1st attempt        2. Which pharmacy/location (including street and city if local pharmacy) is [facility]   3. Do they need a 30 day or 90 day supply? [place]

## 2017-07-01 ENCOUNTER — Ambulatory Visit: Payer: Self-pay | Admitting: *Deleted

## 2017-07-02 ENCOUNTER — Ambulatory Visit (INDEPENDENT_AMBULATORY_CARE_PROVIDER_SITE_OTHER): Payer: Medicare HMO | Admitting: *Deleted

## 2017-07-02 ENCOUNTER — Encounter: Payer: Self-pay | Admitting: *Deleted

## 2017-07-02 VITALS — BP 124/76 | HR 81 | Ht 70.0 in | Wt 129.6 lb

## 2017-07-02 DIAGNOSIS — Z0000 Encounter for general adult medical examination without abnormal findings: Secondary | ICD-10-CM | POA: Diagnosis not present

## 2017-07-02 NOTE — Patient Instructions (Signed)
Follow up with Dr.Paz 09/08/17.  Continue to eat heart healthy diet (full of fruits, vegetables, whole grains, lean protein, water--limit salt, fat, and sugar intake) and increase physical activity as tolerated.   Continue doing brain stimulating activities (puzzles, reading, adult coloring books, staying active) to keep memory sharp.   Bring a copy of your living will and/or healthcare power of attorney to your next office visit.   [name] , Thank you for taking time to come for your Medicare Wellness Visit. I appreciate your ongoing commitment to your health goals. Please review the following plan we discussed and let me know if I can assist you in the future.    This is a list of the screening recommended for you and due dates:  Health Maintenance  Topic Date Due  . Flu Shot  10/22/2017  . Tetanus Vaccine  12/22/2020  . Pneumonia vaccines  Completed   Health Maintenance, Male A healthy lifestyle and preventive care is important for your health and wellness. Ask your health care provider about what schedule of regular examinations is right for you. What should I know about weight and diet? Eat a Healthy Diet  Eat plenty of vegetables, fruits, whole grains, low-fat dairy products, and lean protein.  Do not eat a lot of foods high in solid fats, added sugars, or salt.  Maintain a Healthy Weight Regular exercise can help you achieve or maintain a healthy weight. You should:  Do at least 150 minutes of exercise each week. The exercise should increase your heart rate and make you sweat (moderate-intensity exercise).  Do strength-training exercises at least twice a week.  Watch Your Levels of Cholesterol and Blood Lipids  Have your blood tested for lipids and cholesterol every 5 years starting at 82 years of age. If you are at high risk for heart disease, you should start having your blood tested when you are 82 years old. You may need to have your cholesterol levels checked more  often if: ? Your lipid or cholesterol levels are high. ? You are older than 82 years of age. ? You are at high risk for heart disease.  What should I know about cancer screening? Many types of cancers can be detected early and may often be prevented. Lung Cancer  You should be screened every year for lung cancer if: ? You are a current smoker who has smoked for at least 30 years. ? You are a former smoker who has quit within the past 15 years.  Talk to your health care provider about your screening options, when you should start screening, and how often you should be screened.  Colorectal Cancer  Routine colorectal cancer screening usually begins at 82 years of age and should be repeated every 5-10 years until you are 82 years old. You may need to be screened more often if early forms of precancerous polyps or small growths are found. Your health care provider may recommend screening at an earlier age if you have risk factors for colon cancer.  Your health care provider may recommend using home test kits to check for hidden blood in the stool.  A small camera at the end of a tube can be used to examine your colon (sigmoidoscopy or colonoscopy). This checks for the earliest forms of colorectal cancer.  Prostate and Testicular Cancer  Depending on your age and overall health, your health care provider may do certain tests to screen for prostate and testicular cancer.  Talk to your health  care provider about any symptoms or concerns you have about testicular or prostate cancer.  Skin Cancer  Check your skin from head to toe regularly.  Tell your health care provider about any new moles or changes in moles, especially if: ? There is a change in a mole's size, shape, or color. ? You have a mole that is larger than a pencil eraser.  Always use sunscreen. Apply sunscreen liberally and repeat throughout the day.  Protect yourself by wearing long sleeves, pants, a wide-brimmed hat, and  sunglasses when outside.  What should I know about heart disease, diabetes, and high blood pressure?  If you are 47-67 years of age, have your blood pressure checked every 3-5 years. If you are 60 years of age or older, have your blood pressure checked every year. You should have your blood pressure measured twice-once when you are at a hospital or clinic, and once when you are not at a hospital or clinic. Record the average of the two measurements. To check your blood pressure when you are not at a hospital or clinic, you can use: ? An automated blood pressure machine at a pharmacy. ? A home blood pressure monitor.  Talk to your health care provider about your target blood pressure.  If you are between 23-100 years old, ask your health care provider if you should take aspirin to prevent heart disease.  Have regular diabetes screenings by checking your fasting blood sugar level. ? If you are at a normal weight and have a low risk for diabetes, have this test once every three years after the age of 8. ? If you are overweight and have a high risk for diabetes, consider being tested at a younger age or more often.  A one-time screening for abdominal aortic aneurysm (AAA) by ultrasound is recommended for men aged 72-75 years who are current or former smokers. What should I know about preventing infection? Hepatitis B If you have a higher risk for hepatitis B, you should be screened for this virus. Talk with your health care provider to find out if you are at risk for hepatitis B infection. Hepatitis C Blood testing is recommended for:  Everyone born from [birth year] through 1963.  Anyone with known risk factors for hepatitis C.  Sexually Transmitted Diseases (STDs)  You should be screened each year for STDs including gonorrhea and chlamydia if: ? You are sexually active and are younger than 82 years of age. ? You are older than 82 years of age and your health care provider tells you that you are  at risk for this type of infection. ? Your sexual activity has changed since you were last screened and you are at an increased risk for chlamydia or gonorrhea. Ask your health care provider if you are at risk.  Talk with your health care provider about whether you are at high risk of being infected with HIV. Your health care provider may recommend a prescription medicine to help prevent HIV infection.  What else can I do?  Schedule regular health, dental, and eye exams.  Stay current with your vaccines (immunizations).  Do not use any tobacco products, such as cigarettes, chewing tobacco, and e-cigarettes. If you need help quitting, ask your health care provider.  Limit alcohol intake to no more than 2 drinks per day. One drink equals 12 ounces of beer, 5 ounces of wine, or 1 ounces of hard liquor.  Do not use street drugs.  Do not share needles.  Ask your health care provider for help if you need support or information about quitting drugs.  Tell your health care provider if you often feel depressed.  Tell your health care provider if you have ever been abused or do not feel safe at home. This information is not intended to replace advice given to you by your health care provider. Make sure you discuss any questions you have with your health care provider. Document Released: 09/06/2007 Document Revised: 11/07/2015 Document Reviewed: 12/12/2014 Elsevier Interactive Patient Education  [employer].

## 2017-07-12 DIAGNOSIS — J84112 Idiopathic pulmonary fibrosis: Secondary | ICD-10-CM | POA: Diagnosis not present

## 2017-07-12 DIAGNOSIS — J8410 Pulmonary fibrosis, unspecified: Secondary | ICD-10-CM | POA: Diagnosis not present

## 2017-07-15 ENCOUNTER — Encounter: Payer: Self-pay | Admitting: Cardiovascular Disease

## 2017-07-22 ENCOUNTER — Ambulatory Visit: Payer: Self-pay | Admitting: Cardiovascular Disease

## 2017-07-22 ENCOUNTER — Other Ambulatory Visit: Payer: Self-pay | Admitting: Cardiovascular Disease

## 2017-07-22 DIAGNOSIS — I482 Chronic atrial fibrillation, unspecified: Secondary | ICD-10-CM

## 2017-07-27 NOTE — Progress Notes (Signed)
Cardiology Office Note   Date:  07/30/2017   ID:  Brian [name], DOB 1929-02-11, MRN [id number]  PCP:  Colon Branch, MD  Cardiologist:   Jenkins Rouge, MD   No chief complaint on file.     History of Present Illness:  84 y.o. f/u chronic afib on DOAC. Diagnosed in 2016 with stroke Previous PEG post stroke with residual right sided weakness. PEG now removed. Also with ILD on oxygen followed at [facility] Rx pirfenidone.   Reviewed last echo done 01/31/15 EF 55-60% MAC with mild MR Mild RAE estimated PA 34 mmHg   He is a big [employer] fan 3 children son lives here one in [place] one in [place]. Goes to [facility] 3 x/week. Lots of hobbies including [employer] working. Goes to [name] for his ILD no longer seeing [name] or [name]  Saw [name] RN [facility] 07/02/17 doing well  Seems to have a tremor and worse hearing on interview today   Past Medical History:  Diagnosis Date  . Anxiety   . Atrial fibrillation with RVR ([name])   . B12 deficiency anemia   . CAP (community acquired pneumonia) 01/26/2015  . COPD (chronic obstructive pulmonary disease) ([place])    recent dx--no acute problems  . Crohn's ileocolitis ([place])   . Depression   . ED (erectile dysfunction)   . Emphysema lung ([place]) 2016   Dr. [name]  . Hemorrhoids   . Hiatal hernia 1956   found while in service  . Hypertension 11/11   mild  . Hypertriglyceridemia   . Hypertrophy of prostate    w/o UR obst & oth luts  . Idiopathic pulmonary fibrosis ([place]) 2016   Dr. [name]  . Long term (current) use of anticoagulants 02/21/2011  . Melanoma of scalp ([place])   . Muscle tremor    saw neurology remotely elsewhere, was Rx inderal  . On home oxygen therapy    "2L; 20-24h for the past week" (01/26/2015)  . Osteoarthritis    "maybe in my hands, feet" (01/26/2015)  . Osteopenia    per DEXA 11/2008  . Peripheral neuropathy    h/o neg w/u  . Pneumonia ?2015  . Pulmonary fibrosis ([place]) 2015   Rx Esbriet ~ 10-2013 ([name]),  Dr. [name]  . Renal cyst    bilateral  . Serrated adenoma of colon 03/2010  . Squamous cell carcinoma of skin of scalp    tx'd ~ 87yr; "froze them off"  . Squamous cell carcinoma, face     "froze them off"  . Stroke [facility]     Past Surgical History:  Procedure Laterality Date  . APPENDECTOMY  1984  . CATARACT EXTRACTION W/ INTRAOCULAR LENS  IMPLANT, BILATERAL Bilateral 2007  . CHOLECYSTECTOMY  02/17/2011   Procedure: LAPAROSCOPIC CHOLECYSTECTOMY WITH INTRAOPERATIVE CHOLANGIOGRAM;  Surgeon: BEdward Jolly MD;  Location: [facility];  Service: General;  Laterality: N/A;  . COLON RESECTION  1984   resection of distal ileum and cecum w/ appendectomy, 18-inch small intestine, for Crohn's disease  . COLON SURGERY    . IR GENERIC HISTORICAL  05/29/2016   IR GASTROSTOMY TUBE REMOVAL 05/29/2016 JSandi Mariscal MD [facility]-INTER[facility] RAD  . MOHS SURGERY Right ~ 2014   "side of my scalp"     Current Outpatient Medications  Medication Sig Dispense Refill  . apixaban (ELIQUIS) 5 MG TABS tablet Take 1 tablet (5 mg total) by mouth 2 (two) times daily. 180 tablet 3  . busPIRone (BUSPAR)  15 MG tablet Take 1 tablet (15 mg total) by mouth 2 (two) times daily. 180 tablet 1  . clonazePAM (KLONOPIN) 0.5 MG tablet Take 0.5 tablets (0.25 mg total) by mouth 3 (three) times daily as needed for anxiety. 30 tablet 0  . Cyanocobalamin (NASCOBAL) 500 MCG/0.1ML SOLN Use 1 spray nasally per week as directed. 3 Bottle 2  . ergocalciferol (DRISDOL) 8000 UNIT/ML drops Take 1 mL (8,000 Units total) by mouth 3 (three) times a week. 60 mL 1  . fluticasone (FLONASE) 50 MCG/ACT nasal spray Place into the nose. Place 2 sprays into both nostrils continuously as needed for Rhinitis.    Marland Kitchen gemfibrozil (LOPID) 600 MG tablet Take 1 tablet (600 mg total) by mouth 2 (two) times daily. 180 tablet 1  . hydrocortisone 2.5 % cream Apply topically 2 (two) times daily. 30 g 0  . loperamide (IMODIUM) 1 MG/5ML solution Place 10 mLs (2 mg total) into  feeding tube as needed for diarrhea or loose stools. 120 mL 1  . mesalamine (PENTASA) 250 MG CR capsule Take 4 capsules (1,000 mg total) by mouth 2 (two) times daily. 360 capsule 3  . Multiple Vitamin (DAILY VITAMIN PO) Take by mouth.    . nebivolol (BYSTOLIC) 5 MG tablet Take 1 tablet (5 mg total) by mouth daily. 30 tablet 0  . OXYGEN Inhale into the lungs continuous. 2 L    . Pirfenidone 267 MG CAPS Take by mouth as directed.    . pravastatin (PRAVACHOL) 10 MG tablet Take 1 tablet (10 mg total) by mouth daily. 90 tablet 1  . Probiotic Product (PROBIOTIC DAILY PO) Take 1 tablet by mouth daily.     No current facility-administered medications for this visit.     Allergies:   Patient has no known allergies.    Social History:  The patient  reports that he quit smoking about 27 years ago. His smoking use included cigarettes and pipe. He has a 40.00 pack-year smoking history. He has never used smokeless tobacco. He reports that he does not drink alcohol or use drugs.   Family History:  The patient's family history includes Breast cancer in his mother; Crohn's disease in his brother; Dementia in his sister; Diabetes in his sister; Heart attack (age of onset: 41) in his father; Stroke (age of onset: 11) in his mother.    ROS:  Please see the history of present illness.   Otherwise, review of systems are positive for none.   All other systems are reviewed and negative.    PHYSICAL EXAM: VS:  BP (!) 152/86   Pulse 87   Ht _0  (1.778 m)   Wt 130 lb 4 oz (59.1 kg)   SpO2 95%   BMI 18.69 kg/m  , BMI Body mass index is 18.69 kg/m. Affect appropriate Chronically ill white male  HEENT: normal Neck supple with no adenopathy JVP normal no bruits no thyromegaly Lungs clear with no wheezing and good diaphragmatic motion Heart:  S1/S2 no murmur, no rub, gallop or click PMI normal Abdomen: benighn, post PEG and appendectomy  Distal pulses intact with no bruits No edema Neuro  non-focal Skin warm and dry No muscular weakness Mild spasticity and RUE weakness post CVA     EKG:  05/05/15 afib rate 80 poor R wave progression ? Old anterior MI  06/20/16  Afib rate 65 nonspecific ST changes   Recent Labs: 10/21/2016: TSH 1.59 11/28/2016: ALT 8 03/10/2017: BUN 15; Creatinine, Ser 0.73; Hemoglobin 13.6; Platelets  200; Potassium 3.4; Sodium 142    Lipid Panel    Component Value Date/Time   CHOL 165 02/20/2016 1403   TRIG 87.0 02/20/2016 1403   HDL 67.70 02/20/2016 1403   CHOLHDL 2 02/20/2016 1403   VLDL 17.4 02/20/2016 1403   LDLCALC 80 02/20/2016 1403      Wt Readings from Last 3 Encounters:  07/30/17 130 lb 4 oz (59.1 kg)  07/02/17 129 lb 9.6 oz (58.8 kg)  03/11/17 133 lb 8 oz (60.6 kg)      Other studies Reviewed: Additional studies/ records that were reviewed today include: old cardiology notes Dr Aundra Dubin notes primary Dr Larose Kells and Pulmonary Dr Lamonte Sakai, ECG labs and echo 2016 .    ASSESSMENT AND PLAN:  1.  Afib: chronic good rate control and anticoagulaiton with NOAC 2. CVA coumadin failure recovered on eliquis now  3. GI/PEG removed taking PO weight up  4. ILD baseline 2L goes up to 5 L with exertion f/u [name] on Pirfenidone 5. Cholesterol   Cholesterol is at goal.  Continue current dose of statin and diet Rx.  No myalgias or side effects.  F/U  LFT's in 6 months. Lab Results  Component Value Date   LDLCALC 80 02/20/2016               Current medicines are reviewed at length with the patient today.  The patient does not have concerns regarding medicines.  The following changes have been made:  no change  Labs/ tests ordered today include: none   Orders Placed This Encounter  Procedures  . EKG 12-Lead     Disposition:   FU with me in a year      Signed, Jenkins Rouge, MD  07/30/2017 2:33 PM    [facility] [address], [place], [place]  [address] Phone: [phone]; Fax: [phone]

## 2017-07-30 ENCOUNTER — Ambulatory Visit (INDEPENDENT_AMBULATORY_CARE_PROVIDER_SITE_OTHER): Payer: Medicare HMO | Admitting: Cardiovascular Disease

## 2017-07-30 ENCOUNTER — Encounter: Payer: Self-pay | Admitting: Cardiovascular Disease

## 2017-07-30 VITALS — BP 152/86 | HR 87 | Ht 70.0 in | Wt 130.2 lb

## 2017-07-30 DIAGNOSIS — I482 Chronic atrial fibrillation, unspecified: Secondary | ICD-10-CM

## 2017-07-30 DIAGNOSIS — Z7901 Long term (current) use of anticoagulants: Secondary | ICD-10-CM | POA: Diagnosis not present

## 2017-07-30 DIAGNOSIS — I639 Cerebral infarction, unspecified: Secondary | ICD-10-CM

## 2017-07-30 NOTE — Patient Instructions (Addendum)

## 2017-08-10 ENCOUNTER — Other Ambulatory Visit: Payer: Self-pay | Admitting: *Deleted

## 2017-08-10 DIAGNOSIS — I482 Chronic atrial fibrillation, unspecified: Secondary | ICD-10-CM

## 2017-08-10 MED ORDER — NEBIVOLOL HCL 5 MG PO TABS: 5.0000 mg | ORAL_TABLET | Freq: Every day | ORAL | 3 refills | Status: DC

## 2017-08-11 DIAGNOSIS — J84112 Idiopathic pulmonary fibrosis: Secondary | ICD-10-CM | POA: Diagnosis not present

## 2017-08-11 DIAGNOSIS — J8410 Pulmonary fibrosis, unspecified: Secondary | ICD-10-CM | POA: Diagnosis not present

## 2017-08-13 ENCOUNTER — Other Ambulatory Visit: Payer: Self-pay | Admitting: *Deleted

## 2017-08-18 MED ORDER — APIXABAN 2.5 MG PO TABS: 2.5000 mg | ORAL_TABLET | Freq: Two times a day (BID) | ORAL | 5 refills | Status: DC

## 2017-08-18 NOTE — Telephone Encounter (Signed)
-----   Message from Josue Hector, MD sent at 08/13/2017  5:41 PM EDT ----- Regarding: RE: dosage of Eliquis Given age and low weight 2.5 mg bid seems appropriate  ----- Message ----- From: Margretta Sidle, RN Sent: 08/13/2017   4:34 PM To: Josue Hector, MD, Michaelyn Barter, RN Subject: dosage of Eliquis                              Dr Johnsie Cancel  Received refill request for Eliquis 5mg  q 12 hours for this pt His weight on 07/30/2017 was 59.27 kg Age 84 and his SrCr 0.73. According to his age and weight his dosing at this time should be Eliquis 2.5mg  q 12 hours but his weight on 03/11/2017 was 60.6kg and weight on 02/23/2017 was 61.4kg  Please advise Thank you Elbert Ewings RN

## 2017-08-18 NOTE — Telephone Encounter (Signed)
Age 84 years Wt 59.27 07/30/2017  Saw Dr Johnsie Cancel 07/30/2017  03/10/2017 Hgb 13.6 HCT 39.2 SrCr 0.73 Spoke with pt's wife and instructed that had received message from Dr Johnsie Cancel and he states that according to his age and weight he should be on Eliquis 2.5mg  q 12 hours at this time . She states they have a lot of Eliquis 5mg  so instructed to break tablet in 1/2 and start this today and also informed will send in Eliquis 2.5mg  and instruct to hold this until call that it is needed and she states understanding Pt's wife instructed to start Eliquis 2.5mg  q 12 hours today and she states understanding

## 2017-08-20 ENCOUNTER — Other Ambulatory Visit: Payer: Self-pay | Admitting: Cardiovascular Disease

## 2017-08-20 DIAGNOSIS — I482 Chronic atrial fibrillation, unspecified: Secondary | ICD-10-CM

## 2017-08-24 DIAGNOSIS — J849 Interstitial pulmonary disease, unspecified: Secondary | ICD-10-CM | POA: Diagnosis not present

## 2017-08-24 DIAGNOSIS — Z79899 Other long term (current) drug therapy: Secondary | ICD-10-CM | POA: Diagnosis not present

## 2017-08-24 DIAGNOSIS — R0602 Shortness of breath: Secondary | ICD-10-CM | POA: Diagnosis not present

## 2017-08-24 DIAGNOSIS — J84112 Idiopathic pulmonary fibrosis: Secondary | ICD-10-CM | POA: Diagnosis not present

## 2017-09-08 ENCOUNTER — Ambulatory Visit (INDEPENDENT_AMBULATORY_CARE_PROVIDER_SITE_OTHER): Payer: Medicare HMO | Admitting: Internal Medicine

## 2017-09-08 ENCOUNTER — Encounter: Payer: Self-pay | Admitting: Internal Medicine

## 2017-09-08 VITALS — BP 122/70 | HR 67 | Temp 98.1°F | Resp 16 | Ht 70.0 in | Wt 129.0 lb

## 2017-09-08 DIAGNOSIS — I10 Essential (primary) hypertension: Secondary | ICD-10-CM | POA: Diagnosis not present

## 2017-09-08 DIAGNOSIS — J8410 Pulmonary fibrosis, unspecified: Secondary | ICD-10-CM | POA: Diagnosis not present

## 2017-09-08 DIAGNOSIS — I4891 Unspecified atrial fibrillation: Secondary | ICD-10-CM | POA: Diagnosis not present

## 2017-09-08 DIAGNOSIS — E785 Hyperlipidemia, unspecified: Secondary | ICD-10-CM

## 2017-09-08 DIAGNOSIS — E538 Deficiency of other specified B group vitamins: Secondary | ICD-10-CM

## 2017-09-08 LAB — BASIC METABOLIC PANEL
BUN: 17 mg/dL (ref 6–23)
CO2: 28 mEq/L (ref 19–32)
CREATININE: 0.74 mg/dL (ref 0.40–1.50)
Calcium: 8.9 mg/dL (ref 8.4–10.5)
Chloride: 99 mEq/L (ref 96–112)
GFR: 106.28 mL/min (ref >60.00)
GLUCOSE: 98 mg/dL (ref 70–99)
Potassium: 3.5 mEq/L (ref 3.5–5.1)
Sodium: 137 mEq/L (ref 135–145)

## 2017-09-08 LAB — LIPID PANEL
CHOL/HDL RATIO: 2
Cholesterol: 156 mg/dL (ref 0–200)
HDL: 80.5 mg/dL (ref >39.00)
LDL CALC: 60 mg/dL (ref 0–99)
NONHDL: 75.47
Triglycerides: 75 mg/dL (ref 0.0–149.0)
VLDL: 15 mg/dL (ref 0.0–40.0)

## 2017-09-08 LAB — AST: AST: 17 U/L (ref 0–37)

## 2017-09-08 LAB — CBC WITH DIFFERENTIAL/PLATELET
BASOS ABS: 0 10*3/uL (ref 0.0–0.1)
Basophils Relative: 0.7 % (ref 0.0–3.0)
EOS ABS: 0.1 10*3/uL (ref 0.0–0.7)
Eosinophils Relative: 0.9 % (ref 0.0–5.0)
HEMATOCRIT: 37.7 % — AB (ref 39.0–52.0)
Hemoglobin: 12.6 g/dL — ABNORMAL LOW (ref 13.0–17.0)
LYMPHS ABS: 1.3 10*3/uL (ref 0.7–4.0)
LYMPHS PCT: 20.8 % (ref 12.0–46.0)
MCHC: 33.5 g/dL (ref 30.0–36.0)
MCV: 100.8 fl — AB (ref 78.0–100.0)
MONOS PCT: 8.1 % (ref 3.0–12.0)
Monocytes Absolute: 0.5 10*3/uL (ref 0.1–1.0)
NEUTROS ABS: 4.5 10*3/uL (ref 1.4–7.7)
NEUTROS PCT: 69.5 % (ref 43.0–77.0)
PLATELETS: 217 10*3/uL (ref 150.0–400.0)
RBC: 3.74 Mil/uL — ABNORMAL LOW (ref 4.22–5.81)
RDW: 13.8 % (ref 11.5–15.5)
WBC: 6.5 10*3/uL (ref 4.0–10.5)

## 2017-09-08 LAB — ALT: ALT: 9 U/L (ref 0–53)

## 2017-09-08 MED ORDER — CYANOCOBALAMIN 500 MCG/0.1ML NA SOLN: NASAL | 2 refills | Status: DC

## 2017-09-08 NOTE — Patient Instructions (Signed)
GO TO THE LAB : Get the blood work     GO TO THE FRONT DESK Schedule your next appointment for a   checkup in 6 months 

## 2017-09-08 NOTE — Progress Notes (Signed)
Subjective:    Patient ID: Brian [name], male    DOB: 1928-04-09, 84 y.o.   MRN: [id number]  DOS:  09/08/2017 Type of visit - description : rov Interval history: Here with his wife, in general feeling well. Atrial fibrillation: Note from cardiology reviewed Vitamin deficiencies: Good compliance of medications Anxiety: On BuSpar, has a leftover clonazepam but has not taken it in a while Pulmonary fibrosis: Note from pulmonary reviewed, need of oxygen with exertion has increased High cholesterol: Good compliance with medication, due for labs.  Wt Readings from Last 3 Encounters:  09/08/17 129 lb (58.5 kg)  07/30/17 130 lb 4 oz (59.1 kg)  07/02/17 129 lb 9.6 oz (58.8 kg)    Review of Systems Denies chest pain, no DOE with ADLs. Able to do all his ADLs independently, wife's take care of the house chores. No memory issues. No depression. Appetite is very good, good p.o. Tolerance.  Past Medical History:  Diagnosis Date  . Anxiety   . Atrial fibrillation with RVR ([place])   . B12 deficiency anemia   . CAP (community acquired pneumonia) 01/26/2015  . COPD (chronic obstructive pulmonary disease) ([place])    recent dx--no acute problems  . Crohn's ileocolitis ([place])   . Depression   . ED (erectile dysfunction)   . Emphysema lung ([place]) 2016   Dr. [name]  . Hemorrhoids   . Hiatal hernia 1956   found while in service  . Hypertension 11/11   mild  . Hypertriglyceridemia   . Hypertrophy of prostate    w/o UR obst & oth luts  . Idiopathic pulmonary fibrosis ([place]) 2016   Dr. [name]  . Long term (current) use of anticoagulants 02/21/2011  . Melanoma of scalp ([place])   . Muscle tremor    saw neurology remotely elsewhere, was Rx inderal  . On home oxygen therapy    "2L; 20-24h for the past week" (01/26/2015)  . Osteoarthritis    "maybe in my hands, feet" (01/26/2015)  . Osteopenia    per DEXA 11/2008  . Peripheral neuropathy    h/o neg w/u  . Pneumonia ?2015  . Pulmonary fibrosis  ([place]) 2015   Rx Esbriet ~ 10-2013 ([name]), Dr. [name]  . Renal cyst    bilateral  . Serrated adenoma of colon 03/2010  . Squamous cell carcinoma of skin of scalp    tx'd ~ 89yrs; "froze them off"  . Squamous cell carcinoma, face     "froze them off"  . Stroke [facility]     Past Surgical History:  Procedure Laterality Date  . APPENDECTOMY  1984  . CATARACT EXTRACTION W/ INTRAOCULAR LENS  IMPLANT, BILATERAL Bilateral 2007  . CHOLECYSTECTOMY  02/17/2011   Procedure: LAPAROSCOPIC CHOLECYSTECTOMY WITH INTRAOPERATIVE CHOLANGIOGRAM;  Surgeon: Edward Jolly, MD;  Location: [facility];  Service: General;  Laterality: N/A;  . COLON RESECTION  1984   resection of distal ileum and cecum w/ appendectomy, 18-inch small intestine, for Crohn's disease  . COLON SURGERY    . IR GENERIC HISTORICAL  05/29/2016   IR GASTROSTOMY TUBE REMOVAL 05/29/2016 Sandi Mariscal, MD [facility]-INTER[facility] RAD  . MOHS SURGERY Right ~ 2014   "side of my scalp"    Social History   Socioeconomic History  . Marital status: Married    Spouse name: Dalene Seltzer  . Number of children: 4  . Years of education: Not on file  . Highest education level: Not on file  Occupational History  . Occupation: Retired  . Occupation:  RETIRED    Employer: RETIRED  Social Needs  . Financial resource strain: Not on file  . Food insecurity:    Worry: Not on file    Inability: Not on file  . Transportation needs:    Medical: Not on file    Non-medical: Not on file  Tobacco Use  . Smoking status: Former Smoker    Packs/day: 1.00    Years: 40.00    Pack years: 40.00    Types: Cigarettes, Pipe    Last attempt to quit: 03/24/1990    Years since quitting: 27.4  . Smokeless tobacco: Never Used  Substance and Sexual Activity  . Alcohol use: No    Alcohol/week: 0.0 oz    Comment:    . Drug use: No  . Sexual activity: Never    Birth control/protection: None  Lifestyle  . Physical activity:    Days per week: Not on file    Minutes per session: Not on file   . Stress: Not on file  Relationships  . Social connections:    Talks on phone: Not on file    Gets together: Not on file    Attends religious service: Not on file    Active member of club or organization: Not on file    Attends meetings of clubs or organizations: Not on file    Relationship status: Not on file  . Intimate partner violence:    Fear of current or ex partner: Not on file    Emotionally abused: Not on file    Physically abused: Not on file    Forced sexual activity: Not on file  Other Topics Concern  . 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 Always use your most recent med list.          apixaban 2.5 MG Tabs tablet Commonly known as:  ELIQUIS Take 1 tablet (2.5 mg total) by mouth 2 (two) times daily.   busPIRone 15 MG tablet Commonly known as:  BUSPAR Take 1 tablet (15 mg total) by mouth 2 (two) times daily.   clonazePAM 0.5 MG tablet Commonly known as:  KLONOPIN Take 0.5 tablets (0.25 mg total) by mouth 3 (three) times daily as needed for anxiety.   Cyanocobalamin 500 MCG/0.1ML Soln Commonly known as:  NASCOBAL Use 1 spray nasally per week as directed.   DAILY VITAMIN PO Take by mouth.   ergocalciferol 8000 UNIT/ML drops Commonly known as:  DRISDOL Take 1 mL (8,000 Units total) by mouth 3 (three) times a week.   fluticasone 50 MCG/ACT nasal spray Commonly known as:  FLONASE Place into the nose. Place 2 sprays into both nostrils continuously as needed for Rhinitis.   gemfibrozil 600 MG tablet Commonly known as:  LOPID Take 1 tablet (600 mg total) by mouth 2 (two) times daily.   hydrocortisone 2.5 % cream Apply topically 2 (two) times daily.   loperamide 1 MG/5ML solution Commonly known as:  IMODIUM Place 10 mLs (2  mg total) into feeding tube as needed for diarrhea or loose stools.   mesalamine 250 MG CR capsule Commonly known as:  PENTASA Take 4 capsules (1,000 mg total) by mouth  2 (two) times daily.   nebivolol 5 MG tablet Commonly known as:  BYSTOLIC Take 1 tablet (5 mg total) by mouth daily.   OXYGEN Inhale into the lungs continuous. 2 L   Pirfenidone 267 MG Caps Take by mouth as directed.   pravastatin 10 MG tablet Commonly known as:  PRAVACHOL Take 1 tablet (10 mg total) by mouth daily.   PROBIOTIC DAILY PO Take 1 tablet by mouth daily.          Objective:   Physical Exam BP 122/70 (BP Location: Left Arm, Patient Position: Sitting, Cuff Size: Small)   Pulse 67   Temp 98.1 F (36.7 C) (Oral)   Resp 16   Ht 5\' 10"  (1.778 m)   Wt 129 lb (58.5 kg)   SpO2 94%   BMI 18.51 kg/m  General:   Well developed, NAD, slightly underweight appearing.  HEENT:  Normocephalic . Face symmetric, atraumatic Lungs:  CTA B Normal respiratory effort, no intercostal retractions, no accessory muscle use. Heart: irreg, bradycardic, no pretibial edema bilaterally  Skin: Not pale. Not jaundice Neurologic:  alert & oriented X3.  Speech normal, gait appropriate for age and unassisted Psych--  Cognition and judgment appear intact.  Cooperative with normal attention span and concentration.  Behavior appropriate. No anxious or depressed appearing.      Assessment & Plan:   Assessment   HTN Hypertriglyceridemia Anxiety, on chronic Buspar , sx controlled  Depression 01-2015 after lost a brother  Atrial fibrillation anticoagulated CVA 01-2015: Dysphagia, aphasia, R weakness,  PANDA d/c 05-2016  Pulmonary: --Pulmonary fibrosis, UIP pattern, home oxygen (2 lt, much more O2 w/ exertion )   --Emphysema Tremors Crohn's colitis DJD Osteopenia DEXA 2010, DEXA 03-2014 T score -2.2, on calcium and vitamin D BPH Peripheral  neuropathy previous workup negative Skin cancer SCC B12 deficiency --  nascobal  H/o palpable abdominal aorta --- CT of the abdomen 06/2013 no AAA  PLAN: HTN: BP is normal, on Bystolic. Hypertriglyceridemia: On Pravachol, Lopid, checking a FLP. Anxiety: Symptoms controlled, has a leftover clonazepam but has not use it lately Pulmonary fibrosis: Sees pulmonary regularly, on high doses of oxygen with exertion Atrial fibrillation: Saw cardiology 07/2017, felt to be stable.  Continue Eliquis.  Check a BMP and CBC Vitamin B and D deficiency: Last levels okay, refill Nascobal, on oral vitamin D 3 times a week Social: Lives with wife, independent on all his ADLs, no memory issues. RTC 6 months

## 2017-09-08 NOTE — Progress Notes (Signed)
Pre visit review using our clinic review tool, if applicable. No additional management support is needed unless otherwise documented below in the visit note. 

## 2017-09-09 NOTE — Assessment & Plan Note (Signed)
HTN: BP is normal, on Bystolic. Hypertriglyceridemia: On Pravachol, Lopid, checking a FLP. Anxiety: Symptoms controlled, has a leftover clonazepam but has not use it lately Pulmonary fibrosis: Sees pulmonary regularly, on high doses of oxygen with exertion Atrial fibrillation: Saw cardiology 07/2017, felt to be stable.  Continue Eliquis.  Check a BMP and CBC Vitamin B and D deficiency: Last levels okay, refill Nascobal, on oral vitamin D 3 times a week Social: Lives with wife, independent on all his ADLs, no memory issues. RTC 6 months

## 2017-09-11 DIAGNOSIS — J8410 Pulmonary fibrosis, unspecified: Secondary | ICD-10-CM | POA: Diagnosis not present

## 2017-09-11 DIAGNOSIS — J84112 Idiopathic pulmonary fibrosis: Secondary | ICD-10-CM | POA: Diagnosis not present

## 2017-09-15 ENCOUNTER — Telehealth: Payer: Self-pay | Admitting: Internal Medicine

## 2017-09-15 NOTE — Telephone Encounter (Signed)
Copied from [place] [id number]. Topic: Quick Communication - See Telephone Encounter >> Sep 15, 2017  1:23 PM Hewitt Shorts wrote: Pt is needing a new rx for sent [facility] order pharmacy [facility] pravastatin and buspirone  Best number is [phone]

## 2017-09-16 MED ORDER — BUSPIRONE HCL 15 MG PO TABS: 15.0000 mg | ORAL_TABLET | Freq: Two times a day (BID) | ORAL | 1 refills | Status: DC

## 2017-09-16 MED ORDER — PRAVASTATIN SODIUM 10 MG PO TABS: 10.0000 mg | ORAL_TABLET | Freq: Every day | ORAL | 1 refills | Status: DC

## 2017-09-16 NOTE — Telephone Encounter (Signed)
Refill request for Buspar 15 MG tab and Pravachol 10 MG tab To be sent to his mail order pharmacy. LOV 09/08/17 with Dr. Larose Kells

## 2017-09-21 ENCOUNTER — Other Ambulatory Visit: Payer: Self-pay

## 2017-09-21 MED ORDER — APIXABAN 2.5 MG PO TABS: 2.5000 mg | ORAL_TABLET | Freq: Two times a day (BID) | ORAL | 10 refills | Status: DC

## 2017-09-21 NOTE — Telephone Encounter (Signed)
Pt is a 84 yr old male who last saw Dr Johnsie Cancel on 07/30/17. Last noted weight from 09/08/17 was 58.5Kg and SCr was 0.74. Thus will refill Eliquis 2.5mg  BID.

## 2017-10-11 DIAGNOSIS — J84112 Idiopathic pulmonary fibrosis: Secondary | ICD-10-CM | POA: Diagnosis not present

## 2017-10-11 DIAGNOSIS — J8410 Pulmonary fibrosis, unspecified: Secondary | ICD-10-CM | POA: Diagnosis not present

## 2017-10-18 IMAGING — MR MR HEAD W/O CM
9 of 11 series · 30 of 48 positions shown · non-contrast
Comparison: CT head without contrast in CTA head and neck from the
same day.

CLINICAL DATA: Since change in speech at 8 o\'clock a.m.. Right
arm weakness. Right facial droop. A aphasia. Right arm weakness has
improved.

EXAM:
MRI HEAD WITHOUT CONTRAST
MRA HEAD WITHOUT CONTRAST
TECHNIQUE: Multiplanar, multiecho pulse sequences of the brain and surrounding
structures were obtained without intravenous contrast. Angiographic
images of the head were obtained using MRA technique without
contrast.

[Series 2: FLAIR · sagittal · 5.0mm · 0.47mm/px · 1 of 23 slices shown (1 of 2)]
[im 1/23]
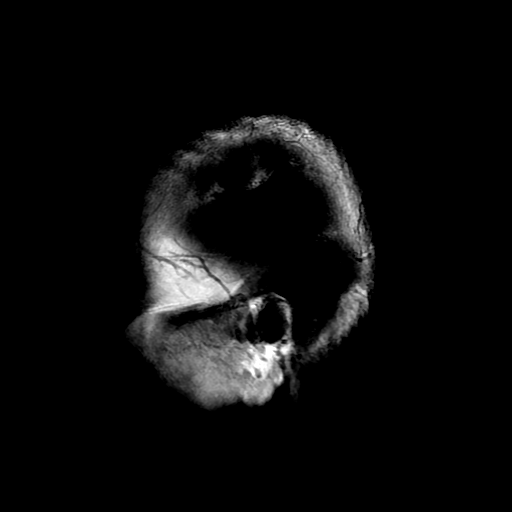

[Series 5: T2 · axial · 5.0mm · 0.43mm/px · 1 of 24 slices shown (1 of 2)]
[im 1/24]
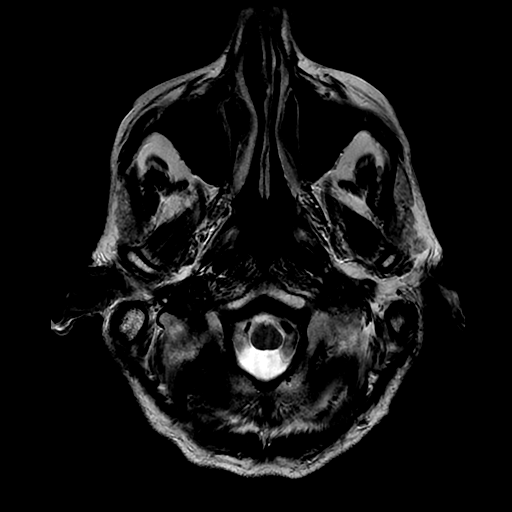

[Series 6: ax (id) 2 · axial · 1.2mm · 0.43mm/px · z∈[-39,+60]mm · 7 of 200 slices shown]
[im 1/200]
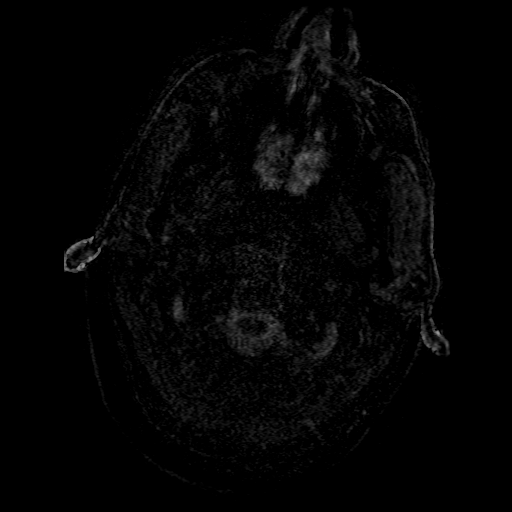
[im 31/200]
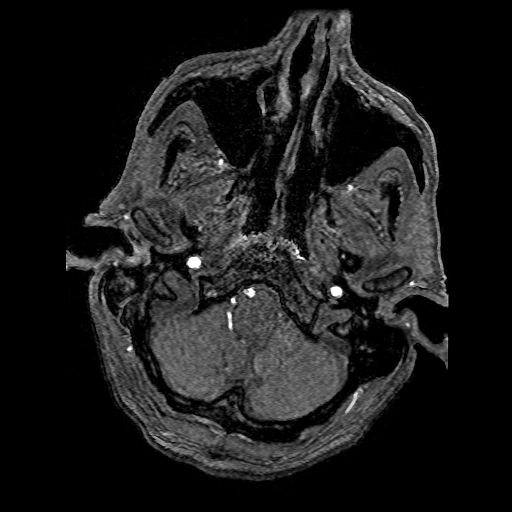
[im 62/200]
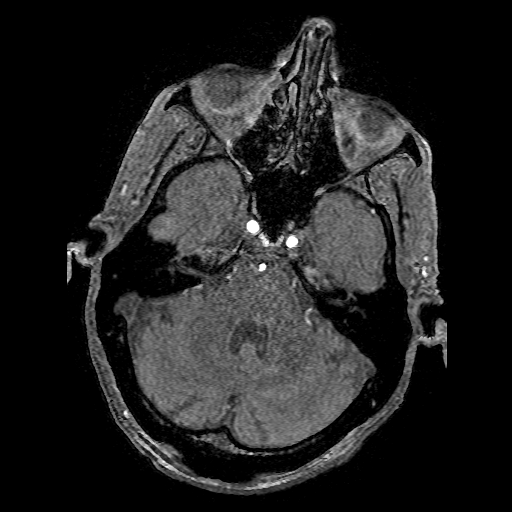
[im 92/200]
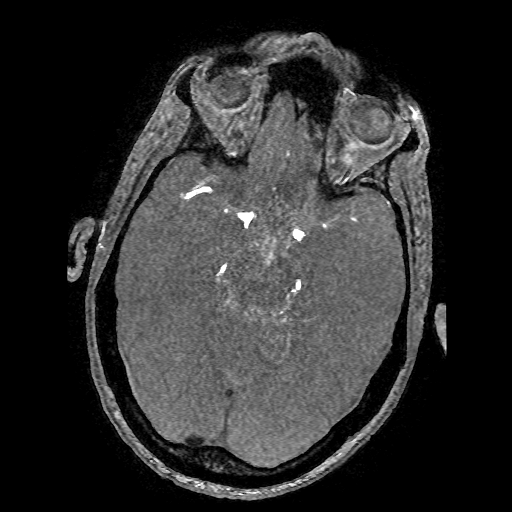
[im 108/200]
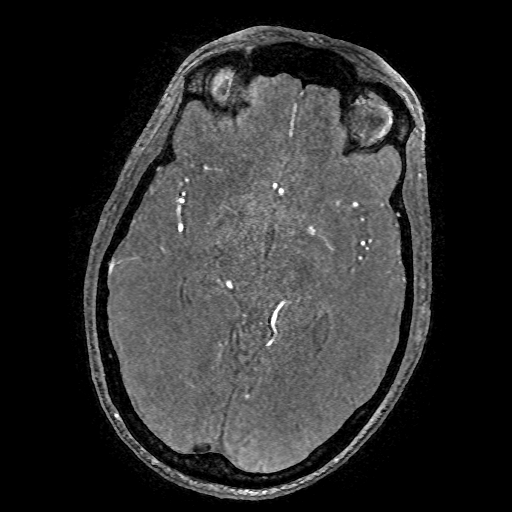
[im 138/200]
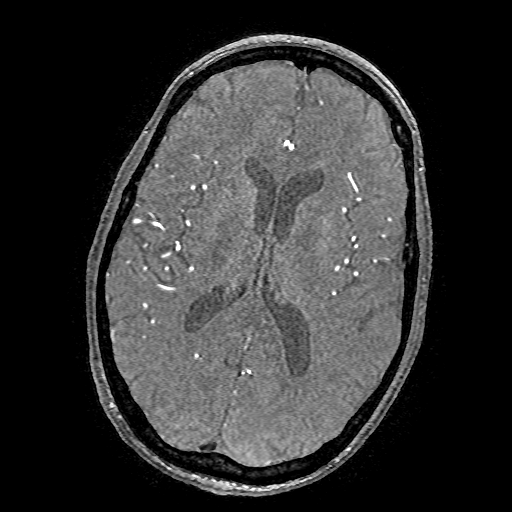
[im 169/200]
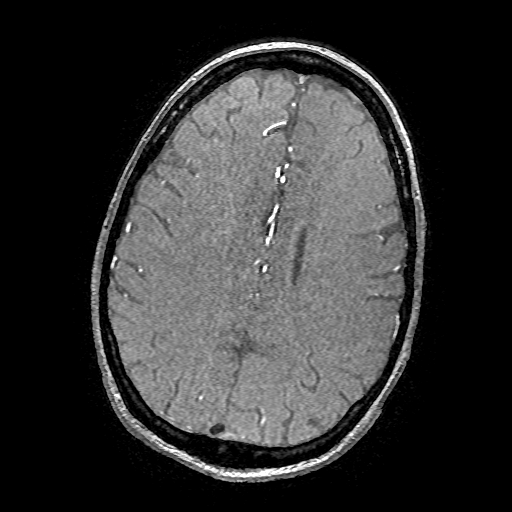

[Series 7: DWI · axial · 3.6mm · 0.94mm/px · z∈[-27,+114]mm · 6 of 82 slices shown (1 of 4)]
[im 1/82]
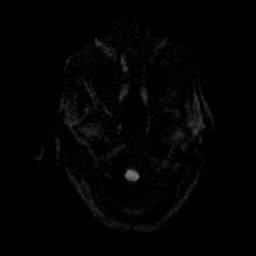
[im 17/82]
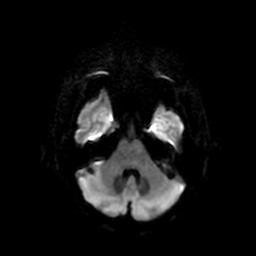
[im 33/82]
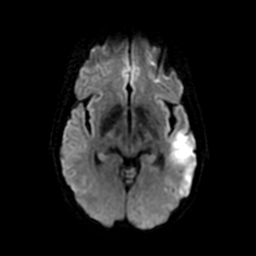
[im 49/82]
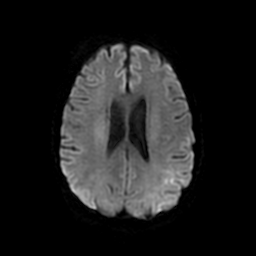
[im 65/82]
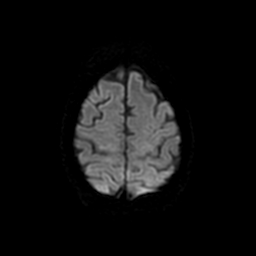
[im 82/82]
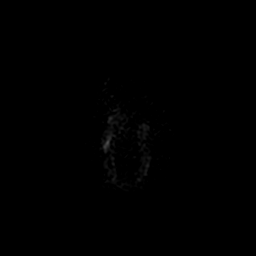

[Series 8: FLAIR · axial · 5.0mm · 0.43mm/px · z∈[-26,+115]mm · 2 of 25 slices shown (2 of 2)]
[im 1/25]
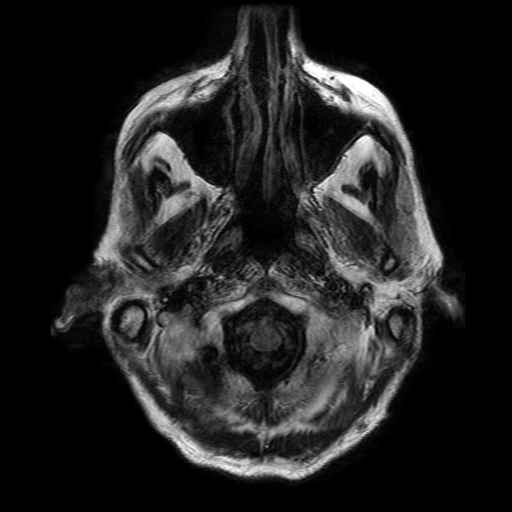
[im 25/25]
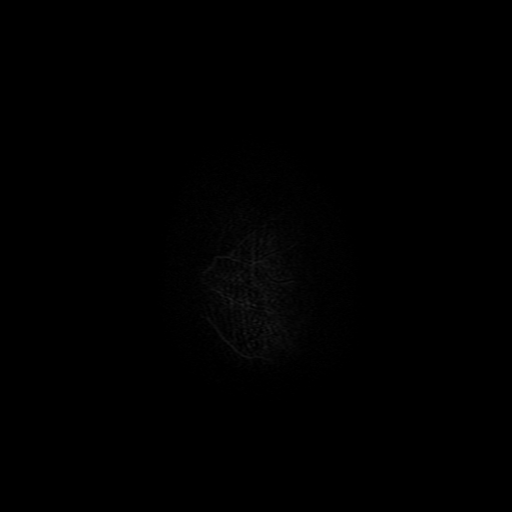

[Series 9: DWI · coronal · 5.0mm · 0.94mm/px · 5 of 71 slices shown (2 of 4)]
[im 1/71]
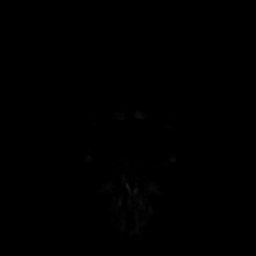
[im 18/71]
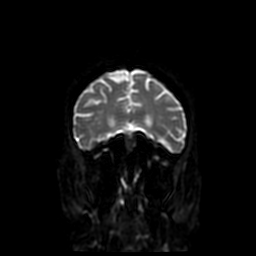
[im 36/71]
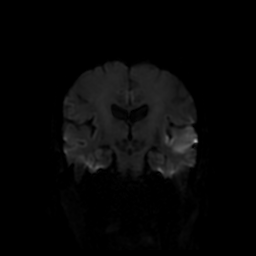
[im 53/71]
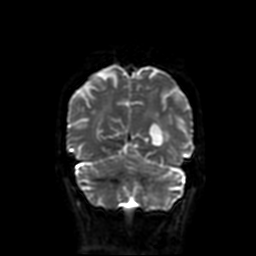
[im 71/71]
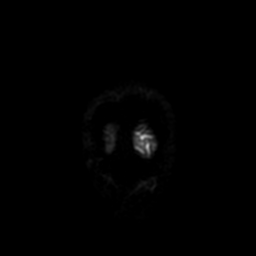

[Series 13: T2 · coronal · 5.0mm · 0.39mm/px · 2 of 30 slices shown (2 of 2)]
[im 1/30]
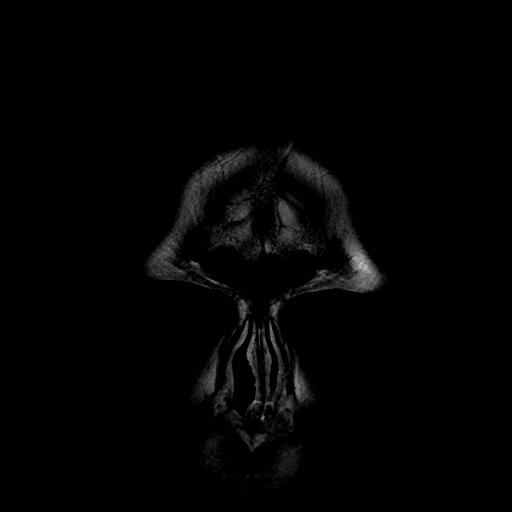
[im 30/30]
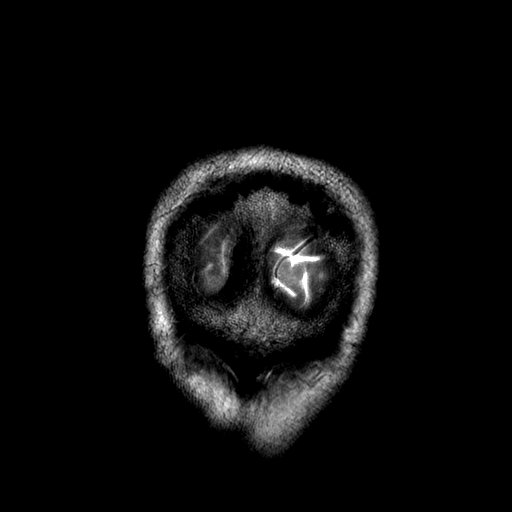

[Series 700: DWI · axial · 3.6mm · 0.94mm/px · z∈[-27,+114]mm · 3 of 41 slices shown (3 of 4)]
[im 1/41]
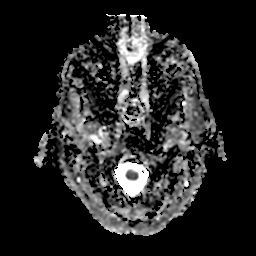
[im 21/41]
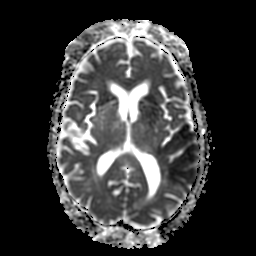
[im 41/41]
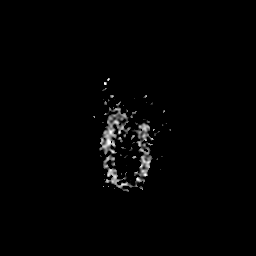

[Series 900: DWI · coronal · 5.0mm · 0.94mm/px · 3 of 36 slices shown (4 of 4)]
[im 1/36]
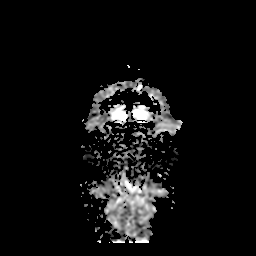
[im 18/36]
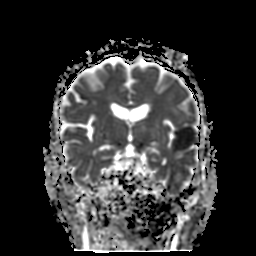
[im 36/36]
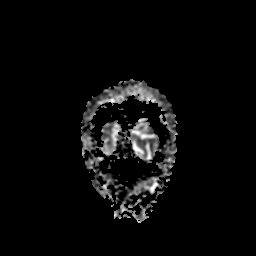

[30 of 48 positions shown; findings below may reference images not displayed]

FINDINGS: MRI HEAD FINDINGS

Acute nonhemorrhagic posterior left MCA territory infarct is
confirmed. This involves the left sylvian fissure extends superiorly
within the left parietal lobe. There is at least 1 additional
punctate cortical infarct in the left parietal lobe on image 31 of
series 7.

T2 changes are evident within the area of acute infarction.

Additional subcortical and periventricular T2 changes are evident
bilaterally.

Flow is present in the major intracranial arteries. Bilateral lens
replacements are present. The paranasal sinuses mastoid air cells
are clear.

MRA HEAD FINDINGS

Internal carotid arteries are within normal limits from the high
cervical segments through the ICA termini bilaterally. The A1 and M1
segments are normal. Anterior communicating artery is patent. ACA
branch vessels are within normal limits. The MCA bifurcations are
intact bilaterally. Right MCA branch vessels are normal. A distal
posterior left MCA branch vessel occlusion is noted. One additional
left M3 branch vessels opacified compared to the earlier CTA.

The left vertebral artery is the dominant vessel. The right PICA
origin is visualized and normal. AICA vessels are noted bilaterally.
The left posterior cerebral artery originates from the basilar tip.
The right posterior cerebral artery originates from the basilar tip
and a posterior communicating artery. The PCA branch vessels are
intact.
IMPRESSION: 1. Acute nonhemorrhagic posterior left MCA territory infarct is
confirmed.
2. Occlusion of distal left posterior MCA branch vessel. There is
opacification of an additional left MCA branch vessels since the
earlier CTA study.
3. Extensive white matter disease is present in addition to the
acute infarct.

## 2017-10-19 IMAGING — RF DG SWALLOWING FUNCTION - NRPT [facility]
1 series · 1 of 1 positions shown · non-contrast
Comparison: none

[Series 1: run · 1 of 1 slices shown]
[im 1/1]
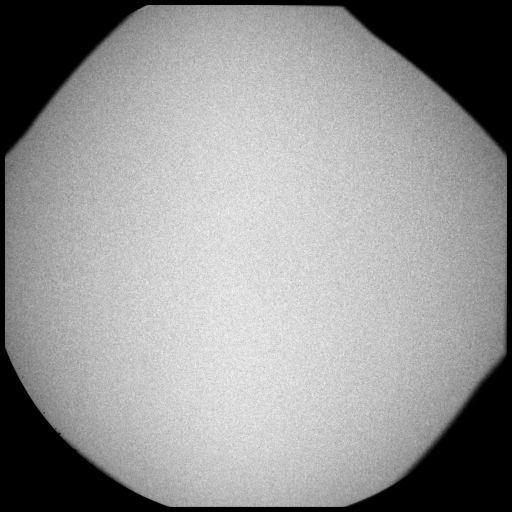

[1 of 1 positions shown; findings below may reference images not displayed]

FLUOROSCOPY FOR SWALLOWING FUNCTION STUDY:
Fluoroscopy was provided for swallowing function study, which was administered by a speech pathologist.  Final results and recommendations from this study are contained within the speech pathology report.

## 2017-10-20 IMAGING — CR DG ABD PORTABLE 1V
1 series · 1 of 1 positions shown · non-contrast
Comparison: CT abdomen and pelvis 07/14/2013

CLINICAL DATA: NG tube placement.

EXAM:
PORTABLE ABDOMEN - 1 VIEW

[AP]
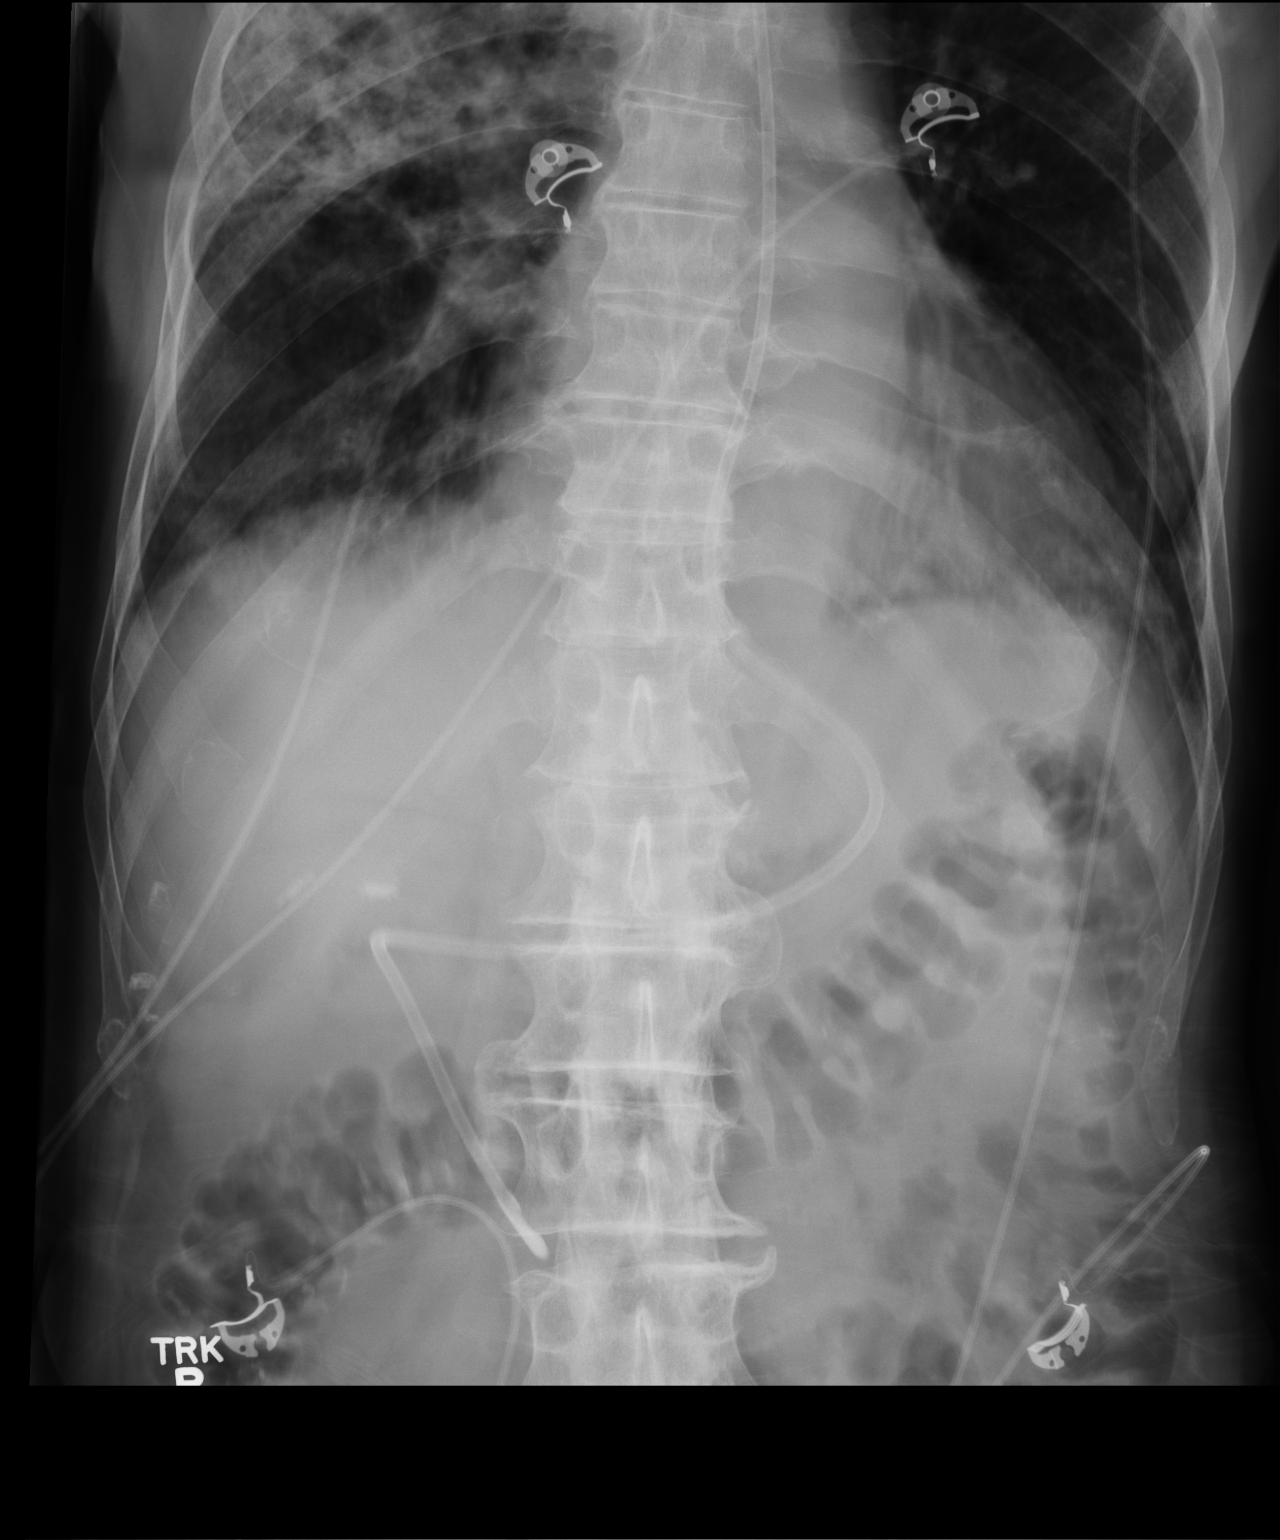

[1 of 1 positions shown; findings below may reference images not displayed]

FINDINGS: A weighted tip enteric tube is present and terminates in the
expected region of the proximal third portion of the duodenum. Gas
is present in nondilated loops of small and large bowel without
evidence of obstruction, although the lower abdomen/ pelvis was not
imaged. Right upper quadrant abdominal surgical clips are noted.
Right upper lobe lung consolidation is more fully evaluated on chest
radiograph earlier today.
IMPRESSION: Feeding tube terminates in the region of the proximal third portion
of the duodenum.

## 2017-11-11 DIAGNOSIS — J8410 Pulmonary fibrosis, unspecified: Secondary | ICD-10-CM | POA: Diagnosis not present

## 2017-11-11 DIAGNOSIS — J84112 Idiopathic pulmonary fibrosis: Secondary | ICD-10-CM | POA: Diagnosis not present

## 2017-11-17 ENCOUNTER — Other Ambulatory Visit: Payer: Self-pay | Admitting: *Deleted

## 2017-11-17 MED ORDER — APIXABAN 2.5 MG PO TABS: 2.5000 mg | ORAL_TABLET | Freq: Two times a day (BID) | ORAL | 2 refills | Status: DC

## 2017-11-17 NOTE — Telephone Encounter (Signed)
Patients wife called and requested that the rx for eliquis be changed to a ninety day supply as this cost them the same as the thirty day. Thanks, MI

## 2017-11-17 NOTE — Telephone Encounter (Signed)
Eliquis 2.5mg  refill request received; pt is 84 yrs old, wt- 58.5kg, Crea-0.74 on 09/08/17, last seen by Dr Johnsie Cancel on 07/30/17; will send in refill to requested pharmacy as they are requested 90 day supply.

## 2017-12-09 DIAGNOSIS — R69 Illness, unspecified: Secondary | ICD-10-CM | POA: Diagnosis not present

## 2017-12-12 DIAGNOSIS — J8410 Pulmonary fibrosis, unspecified: Secondary | ICD-10-CM | POA: Diagnosis not present

## 2017-12-12 DIAGNOSIS — J84112 Idiopathic pulmonary fibrosis: Secondary | ICD-10-CM | POA: Diagnosis not present

## 2017-12-26 ENCOUNTER — Other Ambulatory Visit: Payer: Self-pay | Admitting: Internal Medicine

## 2018-01-07 ENCOUNTER — Encounter (HOSPITAL_BASED_OUTPATIENT_CLINIC_OR_DEPARTMENT_OTHER): Payer: Self-pay | Admitting: *Deleted

## 2018-01-07 ENCOUNTER — Other Ambulatory Visit: Payer: Self-pay

## 2018-01-07 ENCOUNTER — Observation Stay (HOSPITAL_BASED_OUTPATIENT_CLINIC_OR_DEPARTMENT_OTHER)
Admission: EM | Admit: 2018-01-07 | Discharge: 2018-01-09 | Disposition: A | Discharge status: Home / Self Care | Payer: Medicare HMO | Attending: Internal Medicine | Admitting: Internal Medicine

## 2018-01-07 ENCOUNTER — Ambulatory Visit: Payer: Self-pay | Admitting: Medical

## 2018-01-07 ENCOUNTER — Observation Stay (HOSPITAL_COMMUNITY): Payer: Medicare HMO

## 2018-01-07 ENCOUNTER — Emergency Department (HOSPITAL_BASED_OUTPATIENT_CLINIC_OR_DEPARTMENT_OTHER): Payer: Medicare HMO

## 2018-01-07 DIAGNOSIS — E785 Hyperlipidemia, unspecified: Secondary | ICD-10-CM | POA: Diagnosis not present

## 2018-01-07 DIAGNOSIS — Z9981 Dependence on supplemental oxygen: Secondary | ICD-10-CM | POA: Insufficient documentation

## 2018-01-07 DIAGNOSIS — I482 Chronic atrial fibrillation, unspecified: Secondary | ICD-10-CM

## 2018-01-07 DIAGNOSIS — I4891 Unspecified atrial fibrillation: Secondary | ICD-10-CM | POA: Diagnosis not present

## 2018-01-07 DIAGNOSIS — G629 Polyneuropathy, unspecified: Secondary | ICD-10-CM | POA: Diagnosis not present

## 2018-01-07 DIAGNOSIS — R0602 Shortness of breath: Secondary | ICD-10-CM | POA: Diagnosis not present

## 2018-01-07 DIAGNOSIS — F419 Anxiety disorder, unspecified: Secondary | ICD-10-CM | POA: Diagnosis not present

## 2018-01-07 DIAGNOSIS — R69 Illness, unspecified: Secondary | ICD-10-CM | POA: Diagnosis not present

## 2018-01-07 DIAGNOSIS — E43 Unspecified severe protein-calorie malnutrition: Secondary | ICD-10-CM | POA: Diagnosis not present

## 2018-01-07 DIAGNOSIS — J8410 Pulmonary fibrosis, unspecified: Secondary | ICD-10-CM | POA: Diagnosis not present

## 2018-01-07 DIAGNOSIS — I700 Atherosclerosis of aorta: Secondary | ICD-10-CM | POA: Diagnosis not present

## 2018-01-07 DIAGNOSIS — I2510 Atherosclerotic heart disease of native coronary artery without angina pectoris: Secondary | ICD-10-CM | POA: Diagnosis not present

## 2018-01-07 DIAGNOSIS — E46 Unspecified protein-calorie malnutrition: Secondary | ICD-10-CM | POA: Diagnosis not present

## 2018-01-07 DIAGNOSIS — L8990 Pressure ulcer of unspecified site, unspecified stage: Secondary | ICD-10-CM

## 2018-01-07 DIAGNOSIS — F329 Major depressive disorder, single episode, unspecified: Secondary | ICD-10-CM | POA: Insufficient documentation

## 2018-01-07 DIAGNOSIS — G9389 Other specified disorders of brain: Secondary | ICD-10-CM | POA: Insufficient documentation

## 2018-01-07 DIAGNOSIS — J439 Emphysema, unspecified: Secondary | ICD-10-CM | POA: Diagnosis not present

## 2018-01-07 DIAGNOSIS — M8580 Other specified disorders of bone density and structure, unspecified site: Secondary | ICD-10-CM | POA: Insufficient documentation

## 2018-01-07 DIAGNOSIS — I270 Primary pulmonary hypertension: Secondary | ICD-10-CM | POA: Diagnosis not present

## 2018-01-07 DIAGNOSIS — I10 Essential (primary) hypertension: Secondary | ICD-10-CM

## 2018-01-07 DIAGNOSIS — Z85820 Personal history of malignant melanoma of skin: Secondary | ICD-10-CM | POA: Diagnosis not present

## 2018-01-07 DIAGNOSIS — E559 Vitamin D deficiency, unspecified: Secondary | ICD-10-CM | POA: Insufficient documentation

## 2018-01-07 DIAGNOSIS — K50819 Crohn's disease of both small and large intestine with unspecified complications: Secondary | ICD-10-CM

## 2018-01-07 DIAGNOSIS — Z7901 Long term (current) use of anticoagulants: Secondary | ICD-10-CM | POA: Diagnosis not present

## 2018-01-07 DIAGNOSIS — E781 Pure hyperglyceridemia: Secondary | ICD-10-CM | POA: Diagnosis not present

## 2018-01-07 DIAGNOSIS — Z8673 Personal history of transient ischemic attack (TIA), and cerebral infarction without residual deficits: Secondary | ICD-10-CM | POA: Diagnosis not present

## 2018-01-07 DIAGNOSIS — J84112 Idiopathic pulmonary fibrosis: Secondary | ICD-10-CM | POA: Diagnosis present

## 2018-01-07 DIAGNOSIS — J9621 Acute and chronic respiratory failure with hypoxia: Secondary | ICD-10-CM | POA: Diagnosis not present

## 2018-01-07 DIAGNOSIS — Z881 Allergy status to other antibiotic agents status: Secondary | ICD-10-CM | POA: Insufficient documentation

## 2018-01-07 DIAGNOSIS — R531 Weakness: Secondary | ICD-10-CM | POA: Diagnosis not present

## 2018-01-07 DIAGNOSIS — I739 Peripheral vascular disease, unspecified: Secondary | ICD-10-CM | POA: Diagnosis not present

## 2018-01-07 DIAGNOSIS — Z79899 Other long term (current) drug therapy: Secondary | ICD-10-CM | POA: Insufficient documentation

## 2018-01-07 DIAGNOSIS — Z87891 Personal history of nicotine dependence: Secondary | ICD-10-CM | POA: Diagnosis not present

## 2018-01-07 DIAGNOSIS — R0609 Other forms of dyspnea: Secondary | ICD-10-CM

## 2018-01-07 DIAGNOSIS — R0600 Dyspnea, unspecified: Secondary | ICD-10-CM | POA: Diagnosis not present

## 2018-01-07 DIAGNOSIS — J441 Chronic obstructive pulmonary disease with (acute) exacerbation: Principal | ICD-10-CM | POA: Insufficient documentation

## 2018-01-07 DIAGNOSIS — K5090 Crohn's disease, unspecified, without complications: Secondary | ICD-10-CM | POA: Diagnosis present

## 2018-01-07 DIAGNOSIS — Z9049 Acquired absence of other specified parts of digestive tract: Secondary | ICD-10-CM | POA: Diagnosis not present

## 2018-01-07 DIAGNOSIS — E876 Hypokalemia: Secondary | ICD-10-CM | POA: Insufficient documentation

## 2018-01-07 LAB — URINALYSIS, ROUTINE W REFLEX MICROSCOPIC
Bilirubin Urine: NEGATIVE
Glucose, UA: NEGATIVE mg/dL
Hgb urine dipstick: NEGATIVE
Ketones, ur: NEGATIVE mg/dL
LEUKOCYTES UA: NEGATIVE
NITRITE: NEGATIVE
PH: 6.5 (ref 5.0–8.0)
Protein, ur: NEGATIVE mg/dL
SPECIFIC GRAVITY, URINE: 1.01 (ref 1.005–1.030)

## 2018-01-07 LAB — CBC
HEMATOCRIT: 41.5 % (ref 39.0–52.0)
HEMOGLOBIN: 13.7 g/dL (ref 13.0–17.0)
MCH: 33 pg (ref 26.0–34.0)
MCHC: 33 g/dL (ref 30.0–36.0)
MCV: 100 fL (ref 80.0–100.0)
NRBC: 0 % (ref 0.0–0.2)
Platelets: 177 10*3/uL (ref 150–400)
RBC: 4.15 MIL/uL — AB (ref 4.22–5.81)
RDW: 12.7 % (ref 11.5–15.5)
WBC: 6.3 10*3/uL (ref 4.0–10.5)

## 2018-01-07 LAB — BASIC METABOLIC PANEL
ANION GAP: 9 (ref 5–15)
BUN: 16 mg/dL (ref 8–23)
CHLORIDE: 95 mmol/L — AB (ref 98–111)
CO2: 31 mmol/L (ref 22–32)
Calcium: 8.9 mg/dL (ref 8.9–10.3)
Creatinine, Ser: 0.65 mg/dL (ref 0.61–1.24)
GFR calc non Af Amer: >60 mL/min (ref >60)
Glucose, Bld: 104 mg/dL — ABNORMAL HIGH (ref 70–99)
POTASSIUM: 3.4 mmol/L — AB (ref 3.5–5.1)
SODIUM: 135 mmol/L (ref 135–145)

## 2018-01-07 LAB — BRAIN NATRIURETIC PEPTIDE: B Natriuretic Peptide: 236 pg/mL — ABNORMAL HIGH (ref 0.0–100.0)

## 2018-01-07 LAB — TROPONIN I: Troponin I: <0.03 ng/mL (ref <0.03)

## 2018-01-07 LAB — CBG MONITORING, ED: Glucose-Capillary: 100 mg/dL — ABNORMAL HIGH (ref 70–99)

## 2018-01-07 MED ORDER — LACTATED RINGERS IV BOLUS
1000.0000 mL | Freq: Once | INTRAVENOUS | Status: AC
  Administered 2018-01-07: 1000 mL via INTRAVENOUS

## 2018-01-07 MED ORDER — HYDRALAZINE HCL 20 MG/ML IJ SOLN
10.0000 mg | Freq: Once | INTRAMUSCULAR | Status: AC
  Administered 2018-01-07: 10 mg via INTRAVENOUS
  Filled 2018-01-07: qty 1

## 2018-01-07 MED ORDER — ONDANSETRON HCL 4 MG PO TABS: 4.0000 mg | ORAL_TABLET | Freq: Four times a day (QID) | ORAL | Status: DC | PRN

## 2018-01-07 MED ORDER — BUSPIRONE HCL 5 MG PO TABS
15.0000 mg | ORAL_TABLET | Freq: Two times a day (BID) | ORAL | Status: DC
  Administered 2018-01-08 – 2018-01-09 (×3): 15 mg via ORAL
  Filled 2018-01-07 (×3): qty 3

## 2018-01-07 MED ORDER — FUROSEMIDE 10 MG/ML IJ SOLN
40.0000 mg | Freq: Once | INTRAMUSCULAR | Status: AC
  Administered 2018-01-07: 40 mg via INTRAVENOUS
  Filled 2018-01-07: qty 4

## 2018-01-07 MED ORDER — LOPERAMIDE HCL 1 MG/5ML PO LIQD: 2.0000 mg | Freq: Three times a day (TID) | ORAL | Status: DC | PRN

## 2018-01-07 MED ORDER — ENSURE ENLIVE PO LIQD: 237.0000 mL | Freq: Two times a day (BID) | ORAL | Status: DC

## 2018-01-07 MED ORDER — ERGOCALCIFEROL 8000 UNIT/ML PO SOLN
8000.0000 [IU] | ORAL | Status: DC
  Administered 2018-01-08: 8000 [IU] via ORAL
  Filled 2018-01-07: qty 1

## 2018-01-07 MED ORDER — MESALAMINE ER 250 MG PO CPCR
500.0000 mg | ORAL_CAPSULE | Freq: Two times a day (BID) | ORAL | Status: DC
  Administered 2018-01-07 – 2018-01-09 (×4): 500 mg via ORAL
  Filled 2018-01-07 (×4): qty 2

## 2018-01-07 MED ORDER — PRAVASTATIN SODIUM 20 MG PO TABS
10.0000 mg | ORAL_TABLET | Freq: Every evening | ORAL | Status: DC
  Administered 2018-01-07 – 2018-01-08 (×2): 10 mg via ORAL
  Filled 2018-01-07 (×2): qty 1

## 2018-01-07 MED ORDER — APIXABAN 2.5 MG PO TABS
2.5000 mg | ORAL_TABLET | Freq: Two times a day (BID) | ORAL | Status: DC
  Administered 2018-01-07 – 2018-01-09 (×4): 2.5 mg via ORAL
  Filled 2018-01-07 (×4): qty 1

## 2018-01-07 MED ORDER — ACETAMINOPHEN 650 MG RE SUPP: 650.0000 mg | Freq: Four times a day (QID) | RECTAL | Status: DC | PRN

## 2018-01-07 MED ORDER — GEMFIBROZIL 600 MG PO TABS
600.0000 mg | ORAL_TABLET | Freq: Two times a day (BID) | ORAL | Status: DC
  Administered 2018-01-07 – 2018-01-09 (×4): 600 mg via ORAL
  Filled 2018-01-07 (×4): qty 1

## 2018-01-07 MED ORDER — ACETAMINOPHEN 325 MG PO TABS: 650.0000 mg | ORAL_TABLET | Freq: Four times a day (QID) | ORAL | Status: DC | PRN

## 2018-01-07 MED ORDER — NEBIVOLOL HCL 5 MG PO TABS
5.0000 mg | ORAL_TABLET | Freq: Every day | ORAL | Status: DC
  Administered 2018-01-08 – 2018-01-09 (×2): 5 mg via ORAL
  Filled 2018-01-07 (×2): qty 1

## 2018-01-07 MED ORDER — ONDANSETRON HCL 4 MG/2ML IJ SOLN: 4.0000 mg | Freq: Four times a day (QID) | INTRAMUSCULAR | Status: DC | PRN

## 2018-01-07 MED ORDER — LOPERAMIDE HCL 1 MG/7.5ML PO SUSP
2.0000 mg | Freq: Three times a day (TID) | ORAL | Status: DC | PRN
  Filled 2018-01-07 (×2): qty 15

## 2018-01-07 MED ORDER — PIRFENIDONE 801 MG PO TABS
801.0000 mg | ORAL_TABLET | Freq: Three times a day (TID) | ORAL | Status: DC
  Administered 2018-01-08 – 2018-01-09 (×4): 801 mg via ORAL

## 2018-01-07 NOTE — H&P (Signed)
History and Physical    Brian [name] XIP:[id number] DOB: 1928/11/27 DOA: 01/07/2018  PCP: Colon Branch, MD  Patient coming from: Home  I have personally briefly reviewed patient's old medical records in [place]  Chief Complaint: Worsening hypoxia on exertion  HPI: Brian [name] is a 84 y.o. male with medical history significant of IPF and COPD on 2L home O2 at rest, prior stroke, A.fib.  Patient presents to ED at [facility] with worsening DOE, hypoxia on exertion requiring now up to 5L with walking and desating to mid 80s even with that.  No CP, no fevers.  This worsening hypoxia seems to have been ongoing for the past couple of weeks.  Recent further 4-5 lb weight loss per family as well.  Has had trouble with malnutrition in past.  Sounds like he had feeding tube in past but after eating better this was taken out.  Still as his 6/3 pulmonologist note states, he had lost 10lbs over the past year at that point when he weighed 58.4kg in the office.  Today wt 56.2kg.   ED Course: CXR shows pulm fibrosis, BNP 260, EDP gave 40mg  lasix.   Review of Systems: As per HPI otherwise 10 point review of systems negative.   Past Medical History:  Diagnosis Date  . Anxiety   . Atrial fibrillation with RVR ([place])   . B12 deficiency anemia   . CAP (community acquired pneumonia) 01/26/2015  . COPD (chronic obstructive pulmonary disease) ([place])    recent dx--no acute problems  . Crohn's ileocolitis ([place])   . Depression   . ED (erectile dysfunction)   . Emphysema lung ([place]) 2016   Dr. [name]  . Hemorrhoids   . Hiatal hernia 1956   found while in service  . Hypertension 11/11   mild  . Hypertriglyceridemia   . Hypertrophy of prostate    w/o UR obst & oth luts  . Idiopathic pulmonary fibrosis ([place]) 2016   Dr. [name]  . Long term (current) use of anticoagulants 02/21/2011  . Melanoma of scalp ([place])   . Muscle tremor    saw neurology remotely elsewhere, was Rx inderal  . On home oxygen  therapy    "2L; 20-24h for the past week" (01/26/2015)  . Osteoarthritis    "maybe in my hands, feet" (01/26/2015)  . Osteopenia    per DEXA 11/2008  . Peripheral neuropathy    h/o neg w/u  . Pneumonia ?2015  . Pulmonary fibrosis ([place]) 2015   Rx Esbriet ~ 10-2013 ([name]), Dr. [name]  . Renal cyst    bilateral  . Serrated adenoma of colon 03/2010  . Squamous cell carcinoma of skin of scalp    tx'd ~ 42yrs; "froze them off"  . Squamous cell carcinoma, face     "froze them off"  . Stroke [facility])     Past Surgical History:  Procedure Laterality Date  . APPENDECTOMY  1984  . CATARACT EXTRACTION W/ INTRAOCULAR LENS  IMPLANT, BILATERAL Bilateral 2007  . CHOLECYSTECTOMY  02/17/2011   Procedure: LAPAROSCOPIC CHOLECYSTECTOMY WITH INTRAOPERATIVE CHOLANGIOGRAM;  Surgeon: Edward Jolly, MD;  Location: [facility] ORS;  Service: General;  Laterality: N/A;  . COLON RESECTION  1984   resection of distal ileum and cecum w/ appendectomy, 18-inch small intestine, for Crohn's disease  . COLON SURGERY    . IR GENERIC HISTORICAL  05/29/2016   IR GASTROSTOMY TUBE REMOVAL 05/29/2016 Sandi Mariscal, MD [facility]-INTER[facility] RAD  . MOHS SURGERY Right ~ 2014   "  side of my scalp"     reports that he quit smoking about 27 years ago. His smoking use included cigarettes and pipe. He has a 40.00 pack-year smoking history. He has never used smokeless tobacco. He reports that he does not drink alcohol or use drugs.  Allergies  Allergen Reactions  . Cephalexin Nausea And Vomiting    Family History  Problem Relation Age of Onset  . Breast cancer Mother   . Stroke Mother 2  . Heart attack Father 85  . Crohn's disease Brother   . Dementia Sister   . Diabetes Sister   . Colon cancer Neg Hx   . Prostate cancer Neg Hx      Prior to Admission medications   Medication Sig Start Date End Date Taking? Authorizing Provider  apixaban (ELIQUIS) 2.5 MG TABS tablet Take 1 tablet (2.5 mg total) by mouth 2 (two) times daily. 11/17/17  Yes  Josue Hector, MD  busPIRone (BUSPAR) 15 MG tablet Take 1 tablet (15 mg total) by mouth 2 (two) times daily. 09/16/17  Yes Paz, Alda Berthold, MD  ergocalciferol (DRISDOL) 8000 UNIT/ML drops Take 1 mL (8,000 Units total) by mouth 3 (three) times a week. 05/13/17  Yes Paz, Alda Berthold, MD  ESBRIET 801 MG TABS Take 801 mg by mouth 3 (three) times daily. 12/24/17  Yes [provider]  gemfibrozil (LOPID) 600 MG tablet Take 1 tablet (600 mg total) by mouth 2 (two) times daily. 12/28/17  Yes Paz, Alda Berthold, MD  loperamide (IMODIUM) 1 MG/5ML solution Place 10 mLs (2 mg total) into feeding tube as needed for diarrhea or loose stools. Patient taking differently: Take 2 mg by mouth 3 (three) times daily as needed for diarrhea or loose stools.  03/16/15  Yes Midge Minium, MD  mesalamine (PENTASA) 250 MG CR capsule Take 4 capsules (1,000 mg total) by mouth 2 (two) times daily. Patient taking differently: Take 500 mg by mouth 2 (two) times daily.  02/23/17  Yes Ladene Artist, MD  Multiple Vitamin (DAILY VITAMIN PO) Take 1 tablet by mouth daily.    Yes [provider]  nebivolol (BYSTOLIC) 5 MG tablet Take 1 tablet (5 mg total) by mouth daily. 08/10/17  Yes Josue Hector, MD  OXYGEN Inhale into the lungs continuous. 2 L   Yes [provider]  pravastatin (PRAVACHOL) 10 MG tablet Take 1 tablet (10 mg total) by mouth daily. Patient taking differently: Take 10 mg by mouth every evening.  09/16/17  Yes Paz, Alda Berthold, MD  Probiotic Product (PROBIOTIC DAILY PO) Take 1 tablet by mouth daily.   Yes [provider]    Physical Exam: Vitals:   01/07/18 1352 01/07/18 1510 01/07/18 1730 01/07/18 1909  BP: (!) 184/93 (!) 167/97 135/90 (!) 164/111  Pulse: 80 79 83 75  Resp: 20 (!) 26 (!) 23 16  Temp: 98.7 F (37.1 C)   98.2 F (36.8 C)  TempSrc: Rectal     SpO2: 90% 94% (!) 89% 96%  Weight:        Constitutional: NAD, calm, comfortable, emaciated Eyes: PERRL, lids and conjunctivae  normal ENMT: Mucous membranes are moist. Posterior pharynx clear of any exudate or lesions.Normal dentition.  Neck: normal, supple, no masses, no thyromegaly Respiratory: B Inspiratory Crackles Cardiovascular: Regular rate and rhythm, no murmurs / rubs / gallops. No extremity edema. 2+ pedal pulses. No carotid bruits.  Abdomen: no tenderness, no masses palpated. No hepatosplenomegaly. Bowel sounds positive.  Musculoskeletal: no  clubbing / cyanosis. No joint deformity upper and lower extremities. Good ROM, no contractures. Normal muscle tone.  Skin: no rashes, lesions, ulcers. No induration Neurologic: CN 2-12 grossly intact. Sensation intact, DTR normal. Strength 5/5 in all 4.  Psychiatric: Normal judgment and insight. Not oriented to date but otherwise oriented.   Labs on Admission: I have personally reviewed following labs and imaging studies  CBC: Recent Labs  Lab 01/07/18 1358  WBC 6.3  HGB 13.7  HCT 41.5  MCV 100.0  PLT 353   Basic Metabolic Panel: Recent Labs  Lab 01/07/18 1358  NA 135  K 3.4*  CL 95*  CO2 31  GLUCOSE 104*  BUN 16  CREATININE 0.65  CALCIUM 8.9   GFR: Estimated Creatinine Clearance: 51.7 mL/min (by C-G formula based on SCr of 0.65 mg/dL). Liver Function Tests: No results for input(s): AST, ALT, ALKPHOS, BILITOT, PROT, ALBUMIN in the last 168 hours. No results for input(s): LIPASE, AMYLASE in the last 168 hours. No results for input(s): AMMONIA in the last 168 hours. Coagulation Profile: No results for input(s): INR, PROTIME in the last 168 hours. Cardiac Enzymes: Recent Labs  Lab 01/07/18 1358  TROPONINI <0.03   BNP (last 3 results) No results for input(s): PROBNP in the last 8760 hours. HbA1C: No results for input(s): HGBA1C in the last 72 hours. CBG: Recent Labs  Lab 01/07/18 1357  GLUCAP 100*   Lipid Profile: No results for input(s): CHOL, HDL, LDLCALC, TRIG, CHOLHDL, LDLDIRECT in the last 72 hours. Thyroid Function Tests: No  results for input(s): TSH, T4TOTAL, FREET4, T3FREE, THYROIDAB in the last 72 hours. Anemia Panel: No results for input(s): VITAMINB12, FOLATE, FERRITIN, TIBC, IRON, RETICCTPCT in the last 72 hours. Urine analysis:    Component Value Date/Time   COLORURINE YELLOW 01/07/2018 1520   APPEARANCEUR CLEAR 01/07/2018 1520   LABSPEC 1.010 01/07/2018 1520   PHURINE 6.5 01/07/2018 1520   GLUCOSEU NEGATIVE 01/07/2018 1520   HGBUR NEGATIVE 01/07/2018 1520   HGBUR trace-lysed 09/16/2006 [place] 01/07/2018 1520   BILIRUBINUR negative 06/10/2013 1049   KETONESUR NEGATIVE 01/07/2018 1520   PROTEINUR NEGATIVE 01/07/2018 1520   UROBILINOGEN 0.2 01/31/2015 1030   NITRITE NEGATIVE 01/07/2018 1520   LEUKOCYTESUR NEGATIVE 01/07/2018 1520    Radiological Exams on Admission: Dg Chest 2 View  Result Date: 01/07/2018 CLINICAL DATA:  Weakness, fatigue and increased tremors. EXAM: CHEST - 2 VIEW COMPARISON:  11/28/2016 FINDINGS: Cardiomediastinal silhouette is normal. Mediastinal contours appear intact. Chronic changes of advanced pulmonary fibrosis. Increased bilateral pleural thickening versus small pleural effusions. Right upper lobe subpleural airspace opacity again seen, not significantly changed from the prior radiographs. No evidence of pneumothorax. Osseous structures are without acute abnormality. Soft tissues are grossly normal. IMPRESSION: Findings of advanced pulmonary fibrosis, not significantly changed. Increased bilateral pleural thickening versus small pleural effusions. Electronically Signed   By: Fidela Salisbury M.D.   On: 01/07/2018 14:56   Ct Head Wo Contrast  Result Date: 01/07/2018 CLINICAL DATA:  Two days of weakness, fatigue and increased tremors. EXAM: CT HEAD WITHOUT CONTRAST TECHNIQUE: Contiguous axial images were obtained from the base of the skull through the vertex without intravenous contrast. COMPARISON:  Brain MRI 01/31/2015 FINDINGS: Brain: Large area of  encephalomalacia from prior ischemic infarct in the left MCA territory. Ex vacuo dilatation of the left lateral ventricle. No evidence of acute infarction or hemorrhage. Mild brain parenchymal volume loss. Deep white matter microangiopathy. Vascular: No hyperdense vessel or unexpected calcification. Skull: Normal.  Negative for fracture or focal lesion. Sinuses/Orbits: No acute finding. Other: None. IMPRESSION: Large area of encephalomalacia from prior left posterior MCA territory ischemic infarct. No acute intracranial abnormality. Atrophy, chronic microvascular disease. Electronically Signed   By: Fidela Salisbury M.D.   On: 01/07/2018 15:00    EKG: Independently reviewed.  Assessment/Plan Principal Problem:   Acute on chronic respiratory failure with hypoxia (HCC) Active Problems:   Essential hypertension   Crohn's disease ([place])   IPF (idiopathic pulmonary fibrosis) (HCC)   A-fib (HCC)   Protein calorie malnutrition ([place])    1. Worsening hypoxia especially with exertion - 1. Suspect either worsening IPF vs Pulm HTN secondary to IPF, or possibly both. 2. No peripheral edema, wt loss seems less consistent with CHF. 3. Getting CT high res of chest 4. 2d echo 5. Likely needs pulm consult in AM, if IPF worsening despite Esbriet 2. IPF - 1. Continue esbriet for now 2. pulm consult in AM as above 3. 2d echo to eval for pulm HTN 3. HTN - continue home BP meds 4. A.fib - 1. Continue beta blocker for rate control 2. Continue eliquis 5. Protein calorie malnutrition - 1. Dietary consult 6. Crohn's disease - continue mesalamine  DVT prophylaxis: Eliquis Code Status: Full Family Communication: No family in room Disposition Plan: Home after admit Consults called: None, likely needs pulm consult in AM Admission status: Place in obs   GARDNER, [place] [facility] Pager [phone] Only works nights!  If 7AM-7PM, please contact the primary day team physician taking care of  patient  [email or web address] Password [facility]  01/07/2018, 7:36 PM

## 2018-01-07 NOTE — ED Notes (Addendum)
Pt. Is here due to not feeling good per wife.  Pt. Wife said Pt. The Pt. Is not eating well.  Pt. Has burst blood vessel in the R inner eye.  Pt. Loosing weight.  Pt. Having good BMs.  Pt. Has history of feeding tube in past.  Pt. Is on home O2 and has increased the rate with exertion.  Pt. Has tremors that have gotten worse lately per family.  Pt has history of A Fib and is in A Fib at this time.

## 2018-01-07 NOTE — ED Notes (Signed)
Per EDP messner Do not walk Pt. Due to low O2 sat while lying in stretcher

## 2018-01-07 NOTE — ED Triage Notes (Signed)
2 weeks of weight loss. 2 days of weakness and fatigue. He has been laying around today. Tremors. Redness to his right eye.

## 2018-01-07 NOTE — ED Notes (Signed)
Family at bedside. 

## 2018-01-07 NOTE — ED Provider Notes (Signed)
Emergency Department Provider Note   I have reviewed the triage vital signs and the nursing notes.   HISTORY  Chief Complaint Weakness   HPI Brian [name] is a 84 y.o. male with multiple medical problems as documented below the presents to the emergency department today secondary to worsening generalized weakness, dyspnea on exertion and hypoxia.  Patient has residual deficits from his stroke not able to help provide history.  His wife and son provide the history.  Sounds like he normally wears about 2 or 2 and half liters of oxygen over the last couple weeks is a progressively worsening oxygen requirement.  Especially when he walks he has to turn up to almost 5 L because he gets very hypoxic.  He will be get down to 84-85% even with that.  No chest pain.  No fevers.  No worsening cough and no productive cough.  No urinary symptoms.  No vomiting or diarrhea but does have a history of ileocolitis. Has had a tremor and recent 4-5 lb weight loss per family as well. No focal weakness.  No other associated or modifying symptoms.   LEVEL V caveat secondary to cva deficitis.   Past Medical History:  Diagnosis Date  . Anxiety   . Atrial fibrillation with RVR ([place])   . B12 deficiency anemia   . CAP (community acquired pneumonia) 01/26/2015  . COPD (chronic obstructive pulmonary disease) ([place])    recent dx--no acute problems  . Crohn's ileocolitis ([place])   . Depression   . ED (erectile dysfunction)   . Emphysema lung ([place]) 2016   Dr. [name]  . Hemorrhoids   . Hiatal hernia 1956   found while in service  . Hypertension 11/11   mild  . Hypertriglyceridemia   . Hypertrophy of prostate    w/o UR obst & oth luts  . Idiopathic pulmonary fibrosis ([place]) 2016   Dr. [name]  . Long term (current) use of anticoagulants 02/21/2011  . Melanoma of scalp ([place])   . Muscle tremor    saw neurology remotely elsewhere, was Rx inderal  . On home oxygen therapy    "2L; 20-24h for the past week"  (01/26/2015)  . Osteoarthritis    "maybe in my hands, feet" (01/26/2015)  . Osteopenia    per DEXA 11/2008  . Peripheral neuropathy    h/o neg w/u  . Pneumonia ?2015  . Pulmonary fibrosis ([place]) 2015   Rx Esbriet ~ 10-2013 ([name]), Dr. [name]  . Renal cyst    bilateral  . Serrated adenoma of colon 03/2010  . Squamous cell carcinoma of skin of scalp    tx'd ~ 67yrs; "froze them off"  . Squamous cell carcinoma, face     "froze them off"  . Stroke [facility])     Patient Active Problem List   Diagnosis Date Noted  . History of stroke 05/16/2016  . Cerebral infarction due to embolism of left middle cerebral artery ([place]) 04/18/2015  . Chronic anticoagulation 04/18/2015  . HLD (hyperlipidemia) 04/18/2015  . Gait disturbance, post-stroke 03/29/2015  . Atrial fibrillation with RVR ([place]) 03/13/2015  . Dysphagia, pharyngoesophageal phase 02/02/2015  . Aphasia due to recent cerebrovascular accident 02/02/2015  . CVA (cerebral infarction) 01/31/2015  . Hypokalemia   . Pulmonary fibrosis ([place]) 01/26/2015  . PCP NOTES >>>>>>>>>>>> 12/14/2014  . Vitamin D deficiency 11/04/2013  . Vasomotor rhinitis 09/22/2013  . Crohn's disease ([place]) 07/08/2013  . Postinflammatory pulmonary fibrosis ([place]) 05/26/2013  . Anxiety state 05/20/2013  . Personal  history of colonic polyps 04/12/2012  . Pulmonary nodules 03/02/2011  . Cough 03/02/2011  . Annual physical exam 12/23/2010  . Dyspnea 09/03/2010  . HEMORRHOIDS-INTERNAL 03/08/2010  . RECTAL BLEEDING 03/08/2010  . HEMATOCHEZIA 03/06/2010  . Essential hypertension 02/04/2010  . HYPERTROPHY PROSTATE W/O UR OBST & OTH LUTS 12/10/2009  . Osteopenia 12/07/2009  . PERIPHERAL NEUROPATHY 08/29/2009  . FECAL INCONTINENCE 01/29/2009  . OSTEOARTHRITIS, ANKLE, RIGHT 11/01/2008  . ERECTILE DYSFUNCTION 12/02/2007  . TREMOR 12/02/2007  . CARCINOMA, SKIN, SQUAMOUS CELL 09/16/2006  . B12 DEFICIENCY 08/27/2006    Past Surgical History:  Procedure Laterality Date  .  APPENDECTOMY  1984  . CATARACT EXTRACTION W/ INTRAOCULAR LENS  IMPLANT, BILATERAL Bilateral 2007  . CHOLECYSTECTOMY  02/17/2011   Procedure: LAPAROSCOPIC CHOLECYSTECTOMY WITH INTRAOPERATIVE CHOLANGIOGRAM;  Surgeon: Edward Jolly, MD;  Location: [facility] ORS;  Service: General;  Laterality: N/A;  . COLON RESECTION  1984   resection of distal ileum and cecum w/ appendectomy, 18-inch small intestine, for Crohn's disease  . COLON SURGERY    . IR GENERIC HISTORICAL  05/29/2016   IR GASTROSTOMY TUBE REMOVAL 05/29/2016 Sandi Mariscal, MD [facility]  . MOHS SURGERY Right ~ 2014   "side of my scalp"    Current Outpatient Rx  . Order #: [id number] Class: Normal  . Order #: [id number] Class: Normal  . Order #: [id number] Class: Print  . Order #: [id number] Class: Normal  . Order #: [id number] Class: Normal  . Order #: [id number] Class: Historical Med  . Order #: [id number] Class: Normal  . Order #: [id number] Class: Normal  . Order #: [id number] Class: Normal  . Order #: [id number] Class: Normal  . Order #: [id number] Class: Historical Med  . Order #: [id number] Class: Normal  . Order #: [id number] Class: Historical Med  . Order #: [id number] Class: Historical Med  . Order #: [id number] Class: Normal  . Order #: [id number] Class: Historical Med    Allergies Patient has no known allergies.  Family History  Problem Relation Age of Onset  . Breast cancer Mother   . Stroke Mother 66  . Heart attack Father 9  . Crohn's disease Brother   . Dementia Sister   . Diabetes Sister   . Colon cancer Neg Hx   . Prostate cancer Neg Hx     Social History Social History   Tobacco Use  . Smoking status: Former Smoker    Packs/day: 1.00    Years: 40.00    Pack years: 40.00    Types: Cigarettes, Pipe    Last attempt to quit: 03/24/1990    Years since quitting: 27.8  . Smokeless tobacco: Never Used  Substance Use Topics  . Alcohol use: No    Alcohol/week: 0.0 standard drinks    Comment:    . Drug use: No    Review of  Systems  LEVEL V caveat secondary to cva deficitis.  ____________________________________________   PHYSICAL EXAM:  VITAL SIGNS: ED Triage Vitals  Enc Vitals Group     BP 01/07/18 1308 (!) 179/68     Pulse Rate 01/07/18 1308 74     Resp 01/07/18 1308 20     Temp 01/07/18 1308 (!) 97.5 F (36.4 C)     Temp Source 01/07/18 1308 Oral     SpO2 01/07/18 1308 94 %     Weight 01/07/18 1306 124 lb (56.2 kg)    Constitutional: Alert and oriented. Well appearing and in no acute distress. Eyes: Conjunctivae are normal. PERRL. EOMI. Head: Atraumatic. Nose: No congestion/rhinnorhea. Mouth/Throat: Mucous membranes are moist.  Oropharynx  non-erythematous. Neck: No stridor.  No meningeal signs.   Cardiovascular: Normal rate, regular rhythm. Good peripheral circulation. Grossly normal heart sounds.   Respiratory: Normal respiratory effort.  No retractions. Lungs CTAB. Gastrointestinal: Soft and nontender. No distention.  Musculoskeletal: No lower extremity tenderness nor edema. No gross deformities of extremities. Neurologic:  Normal speech and language. No gross focal neurologic deficits are appreciated.  Skin:  Skin is warm, dry and intact. No rash noted.   ____________________________________________   LABS (all labs ordered are listed, but only abnormal results are displayed)  Labs Reviewed  BASIC METABOLIC PANEL - Abnormal; Notable for the following components:      Result Value   Potassium 3.4 (*)    Chloride 95 (*)    Glucose, Bld 104 (*)    All other components within normal limits  CBC - Abnormal; Notable for the following components:   RBC 4.15 (*)    All other components within normal limits  BRAIN NATRIURETIC PEPTIDE - Abnormal; Notable for the following components:   B Natriuretic Peptide 236.0 (*)    All other components within normal limits  CBG MONITORING, ED - Abnormal; Notable for the following components:   Glucose-Capillary 100 (*)    All other components  within normal limits  URINALYSIS, ROUTINE W REFLEX MICROSCOPIC  TROPONIN I   ____________________________________________  EKG   EKG Interpretation  Date/Time:  Thursday January 07 2018 13:26:53 EDT Ventricular Rate:  65 PR Interval:    QRS Duration: 115 QT Interval:  465 QTC Calculation: 484 R Axis:   71 Text Interpretation:  Atrial fibrillation Nonspecific intraventricular conduction delay Anteroseptal infarct, age indeterminate Baseline wander in lead(s) V5 given artifact, no significant changes from 04/2015 Confirmed by Merrily Pew [phone]) on 01/07/2018 2:22:07 PM Also confirmed by Merrily Pew [phone]), editor Hattie Perch ([id number])  on 01/07/2018 2:42:36 PM       ____________________________________________  RADIOLOGY  Dg Chest 2 View  Result Date: 01/07/2018 CLINICAL DATA:  Weakness, fatigue and increased tremors. EXAM: CHEST - 2 VIEW COMPARISON:  11/28/2016 FINDINGS: Cardiomediastinal silhouette is normal. Mediastinal contours appear intact. Chronic changes of advanced pulmonary fibrosis. Increased bilateral pleural thickening versus small pleural effusions. Right upper lobe subpleural airspace opacity again seen, not significantly changed from the prior radiographs. No evidence of pneumothorax. Osseous structures are without acute abnormality. Soft tissues are grossly normal. IMPRESSION: Findings of advanced pulmonary fibrosis, not significantly changed. Increased bilateral pleural thickening versus small pleural effusions. Electronically Signed   By: Fidela Salisbury M.D.   On: 01/07/2018 14:56   Ct Head Wo Contrast  Result Date: 01/07/2018 CLINICAL DATA:  Two days of weakness, fatigue and increased tremors. EXAM: CT HEAD WITHOUT CONTRAST TECHNIQUE: Contiguous axial images were obtained from the base of the skull through the vertex without intravenous contrast. COMPARISON:  Brain MRI 01/31/2015 FINDINGS: Brain: Large area of encephalomalacia from prior ischemic  infarct in the left MCA territory. Ex vacuo dilatation of the left lateral ventricle. No evidence of acute infarction or hemorrhage. Mild brain parenchymal volume loss. Deep white matter microangiopathy. Vascular: No hyperdense vessel or unexpected calcification. Skull: Normal. Negative for fracture or focal lesion. Sinuses/Orbits: No acute finding. Other: None. IMPRESSION: Large area of encephalomalacia from prior left posterior MCA territory ischemic infarct. No acute intracranial abnormality. Atrophy, chronic microvascular disease. Electronically Signed   By: Fidela Salisbury M.D.   On: 01/07/2018 15:00    ____________________________________________   PROCEDURES  Procedure(s) performed:   Procedures   ____________________________________________   INITIAL  IMPRESSION / ASSESSMENT AND PLAN / ED COURSE  84 year old male here with generalized weakness and worsening hypoxia.  Patient hypoxic even on exam just by sitting up on 2 and half to 3 L.  Apparently is a lot worse when he ambulates as well per the family.  His lungs are diminished bilaterally but no acute findings.  His work-up here is relatively unremarkable except for an elevated BNP.  This concern for possible pulmonary hypertension or new onset heart failure and worsening symptoms. Could also consider ACS/unstable angina or PE however less likely.   Will admit for echo and further management/workup.   Pertinent labs & imaging results that were available during my care of the patient were reviewed by me and considered in my medical decision making (see chart for details).  ____________________________________________  FINAL CLINICAL IMPRESSION(S) / ED DIAGNOSES  Final diagnoses:  Dyspnea on exertion  Acute on chronic respiratory failure with hypoxia (HCC)     MEDICATIONS GIVEN DURING THIS VISIT:  Medications  furosemide (LASIX) injection 40 mg (has no administration in time range)  lactated ringers bolus 1,000 mL (  Intravenous Rate/Dose Change 01/07/18 1533)     NEW OUTPATIENT MEDICATIONS STARTED DURING THIS VISIT:  New Prescriptions   No medications on file    Note:  This note was prepared with assistance of Dragon voice recognition software. Occasional wrong-word or sound-a-like substitutions may have occurred due to the inherent limitations of voice recognition software.   Merrily Pew, MD 01/08/18 [phone]

## 2018-01-07 NOTE — ED Notes (Signed)
ED Provider at bedside. 

## 2018-01-08 ENCOUNTER — Observation Stay (HOSPITAL_BASED_OUTPATIENT_CLINIC_OR_DEPARTMENT_OTHER): Payer: Medicare HMO

## 2018-01-08 DIAGNOSIS — E46 Unspecified protein-calorie malnutrition: Secondary | ICD-10-CM | POA: Diagnosis not present

## 2018-01-08 DIAGNOSIS — J9621 Acute and chronic respiratory failure with hypoxia: Secondary | ICD-10-CM

## 2018-01-08 DIAGNOSIS — E43 Unspecified severe protein-calorie malnutrition: Secondary | ICD-10-CM | POA: Diagnosis not present

## 2018-01-08 DIAGNOSIS — I10 Essential (primary) hypertension: Secondary | ICD-10-CM | POA: Diagnosis not present

## 2018-01-08 DIAGNOSIS — I482 Chronic atrial fibrillation, unspecified: Secondary | ICD-10-CM | POA: Diagnosis not present

## 2018-01-08 DIAGNOSIS — J84112 Idiopathic pulmonary fibrosis: Secondary | ICD-10-CM | POA: Diagnosis not present

## 2018-01-08 DIAGNOSIS — I361 Nonrheumatic tricuspid (valve) insufficiency: Secondary | ICD-10-CM | POA: Diagnosis not present

## 2018-01-08 DIAGNOSIS — K50819 Crohn's disease of both small and large intestine with unspecified complications: Secondary | ICD-10-CM | POA: Diagnosis not present

## 2018-01-08 LAB — ECHOCARDIOGRAM COMPLETE: WEIGHTICAEL: 1984 [oz_av]

## 2018-01-08 MED ORDER — FUROSEMIDE 20 MG PO TABS
20.0000 mg | ORAL_TABLET | Freq: Every day | ORAL | Status: DC
  Administered 2018-01-08: 20 mg via ORAL
  Filled 2018-01-08: qty 1

## 2018-01-08 MED ORDER — AZITHROMYCIN 250 MG PO TABS
500.0000 mg | ORAL_TABLET | Freq: Every day | ORAL | Status: AC
  Administered 2018-01-08: 500 mg via ORAL
  Filled 2018-01-08: qty 2

## 2018-01-08 MED ORDER — BOOST / RESOURCE BREEZE PO LIQD CUSTOM
1.0000 | Freq: Two times a day (BID) | ORAL | Status: DC
  Administered 2018-01-08 – 2018-01-09 (×2): 1 via ORAL

## 2018-01-08 MED ORDER — ORAL CARE MOUTH RINSE
15.0000 mL | Freq: Two times a day (BID) | OROMUCOSAL | Status: DC
  Administered 2018-01-08 (×2): 15 mL via OROMUCOSAL

## 2018-01-08 MED ORDER — ADULT MULTIVITAMIN W/MINERALS CH
1.0000 | ORAL_TABLET | Freq: Every day | ORAL | Status: DC
  Administered 2018-01-08 – 2018-01-09 (×2): 1 via ORAL
  Filled 2018-01-08 (×2): qty 1

## 2018-01-08 MED ORDER — AZITHROMYCIN 250 MG PO TABS
250.0000 mg | ORAL_TABLET | Freq: Every day | ORAL | Status: DC
  Administered 2018-01-09: 250 mg via ORAL
  Filled 2018-01-08: qty 1

## 2018-01-08 NOTE — Progress Notes (Addendum)
PROGRESS NOTE    Brian [name]   XNA:[id number]  DOB: 1928/05/18  DOA: 01/07/2018 PCP: Colon Branch, MD   Brief Narrative:  Brian [name] is a 84 y.o. male with medical history significant of IPF and COPD on 2L home O2 at rest, prior stroke, A.fib.  Patient presents to ED at [facility] with worsening DOE, hypoxia on exertion requiring now up to 5L with walking and desating to mid 80s even with that. He admits to a loss of appetite for 2 wks with a weight loss of about 5-6 lbs.    Subjective: Has a cough with clear sputum for about 2 wks now. His wife thinks it may be allergies. He still has no appetite.     Assessment & Plan:   Principal Problem:   Acute on chronic respiratory failure with hypoxia  - ? Progression of IPF or COPD- have asked for pulm eval and he has been started on a Z pak and diuretics - CT chest > The appearance of the lungs is compatible with interstitial lung disease, with a spectrum of findings classified as usual interstitial pneumonia (UIP) per ATS criteria. There is also diffuse bronchial wall thickening with moderate to severe centrilobular and paraseptal emphysema - cont Esbriet - I do not feel he is fluid overloaded and I will d/c diuretics to prevent renal failure (in setting of severely decreased oral intake) - pulmonary does not feel steroids will help -pulse ox drops to mid 80s on 6 L when ambulating- start IS -  cont to follow O2 level- will re-assess tomorrow - ECHO shows mod pulm HTN, mild to mod MR, mod TR  Active Problems:     Crohn's disease  - mostly controlled with Pentasa- needs to take Imodium PRN    A-fib (HCC) - cont Eliquis and Bystolic    Protein calorie malnutrition - severe- Ensure causes diarrhea- his wife gives him 2 protein supplements daily at home- I have had an extensive conversation with them about increasing his oral intake    DVT prophylaxis: Eliquis Code Status: Full code Family Communication: wife and son at  bedside Disposition Plan: home when discharged Consultants:   PCCM Procedures:   2 D ECHO Compared to a prior study in 2016, the LVEF is higher at 60-65%.   There is now mild to moderate, posteriorly directed mitral   regurgitation with normal LA size, moderate TR and moderate   pulmonary arterial hypertension with RVSP of 64 mmHg and normal   IVC.  Antimicrobials:  Anti-infectives (From admission, onward)   Start     Dose/Rate Route Frequency Ordered Stop   01/09/18 1000  azithromycin (ZITHROMAX) tablet 250 mg     250 mg Oral Daily 01/08/18 1359 01/13/18 0959   01/08/18 1400  azithromycin (ZITHROMAX) tablet 500 mg     500 mg Oral Daily 01/08/18 1359 01/09/18 0959       Objective: Vitals:   01/07/18 1909 01/07/18 2119 01/08/18 0029 01/08/18 0414  BP: (!) 164/111 (!) 177/120 95/60 125/76  Pulse: 75 73  83  Resp: 16 20  16   Temp: 98.2 F (36.8 C) 97.7 F (36.5 C)  97.6 F (36.4 C)  TempSrc:  Oral    SpO2: 96% 98%  98%  Weight:        Intake/Output Summary (Last 24 hours) at 01/08/2018 1406 Last data filed at 01/08/2018 0600 Gross per 24 hour  Intake 240 ml  Output 1350 ml  Net -1110 ml  Filed Weights   01/07/18 1306  Weight: 56.2 kg    Examination: General exam: Appears comfortable - cachectic  HEENT: PERRLA, oral mucosa moist, no sclera icterus or thrush Respiratory system: b/l coarse crackles Respiratory effort normal. Cardiovascular system: S1 & S2 heard, RRR.   Gastrointestinal system: Abdomen soft, non-tender, nondistended. Normal bowel sound. No organomegaly Central nervous system: Alert and oriented. No focal neurological deficits. Extremities: No cyanosis, clubbing or edema Skin: No rashes or ulcers Psychiatry:  Mood & affect appropriate.     Data Reviewed: I have personally reviewed following labs and imaging studies  CBC: Recent Labs  Lab 01/07/18 1358  WBC 6.3  HGB 13.7  HCT 41.5  MCV 100.0  PLT 527   Basic Metabolic  Panel: Recent Labs  Lab 01/07/18 1358  NA 135  K 3.4*  CL 95*  CO2 31  GLUCOSE 104*  BUN 16  CREATININE 0.65  CALCIUM 8.9   GFR: Estimated Creatinine Clearance: 51.7 mL/min (by C-G formula based on SCr of 0.65 mg/dL). Liver Function Tests: No results for input(s): AST, ALT, ALKPHOS, BILITOT, PROT, ALBUMIN in the last 168 hours. No results for input(s): LIPASE, AMYLASE in the last 168 hours. No results for input(s): AMMONIA in the last 168 hours. Coagulation Profile: No results for input(s): INR, PROTIME in the last 168 hours. Cardiac Enzymes: Recent Labs  Lab 01/07/18 1358  TROPONINI <0.03   BNP (last 3 results) No results for input(s): PROBNP in the last 8760 hours. HbA1C: No results for input(s): HGBA1C in the last 72 hours. CBG: Recent Labs  Lab 01/07/18 1357  GLUCAP 100*   Lipid Profile: No results for input(s): CHOL, HDL, LDLCALC, TRIG, CHOLHDL, LDLDIRECT in the last 72 hours. Thyroid Function Tests: No results for input(s): TSH, T4TOTAL, FREET4, T3FREE, THYROIDAB in the last 72 hours. Anemia Panel: No results for input(s): VITAMINB12, FOLATE, FERRITIN, TIBC, IRON, RETICCTPCT in the last 72 hours. Urine analysis:    Component Value Date/Time   COLORURINE YELLOW 01/07/2018 [place] 01/07/2018 1520   LABSPEC 1.010 01/07/2018 1520   PHURINE 6.5 01/07/2018 1520   GLUCOSEU NEGATIVE 01/07/2018 1520   HGBUR NEGATIVE 01/07/2018 1520   HGBUR trace-lysed 09/16/2006 0821   BILIRUBINUR NEGATIVE 01/07/2018 1520   BILIRUBINUR negative 06/10/2013 [place] 01/07/2018 1520   PROTEINUR NEGATIVE 01/07/2018 1520   UROBILINOGEN 0.2 01/31/2015 1030   NITRITE NEGATIVE 01/07/2018 1520   LEUKOCYTESUR NEGATIVE 01/07/2018 1520   Sepsis Labs: @LABRCNTIP (procalcitonin:4,lacticidven:4) )No results found for this or any previous visit (from the past 240 hour(s)).       Radiology Studies: Dg Chest 2 View  Result Date: 01/07/2018 CLINICAL  DATA:  Weakness, fatigue and increased tremors. EXAM: CHEST - 2 VIEW COMPARISON:  11/28/2016 FINDINGS: Cardiomediastinal silhouette is normal. Mediastinal contours appear intact. Chronic changes of advanced pulmonary fibrosis. Increased bilateral pleural thickening versus small pleural effusions. Right upper lobe subpleural airspace opacity again seen, not significantly changed from the prior radiographs. No evidence of pneumothorax. Osseous structures are without acute abnormality. Soft tissues are grossly normal. IMPRESSION: Findings of advanced pulmonary fibrosis, not significantly changed. Increased bilateral pleural thickening versus small pleural effusions. Electronically Signed   By: Fidela Salisbury M.D.   On: 01/07/2018 14:56   Ct Head Wo Contrast  Result Date: 01/07/2018 CLINICAL DATA:  Two days of weakness, fatigue and increased tremors. EXAM: CT HEAD WITHOUT CONTRAST TECHNIQUE: Contiguous axial images were obtained from the base of the skull through the vertex without  intravenous contrast. COMPARISON:  Brain MRI 01/31/2015 FINDINGS: Brain: Large area of encephalomalacia from prior ischemic infarct in the left MCA territory. Ex vacuo dilatation of the left lateral ventricle. No evidence of acute infarction or hemorrhage. Mild brain parenchymal volume loss. Deep white matter microangiopathy. Vascular: No hyperdense vessel or unexpected calcification. Skull: Normal. Negative for fracture or focal lesion. Sinuses/Orbits: No acute finding. Other: None. IMPRESSION: Large area of encephalomalacia from prior left posterior MCA territory ischemic infarct. No acute intracranial abnormality. Atrophy, chronic microvascular disease. Electronically Signed   By: Fidela Salisbury M.D.   On: 01/07/2018 15:00   Ct Chest High Resolution  Result Date: 01/08/2018 CLINICAL DATA:  84 year old male with history of idiopathic pulmonary fibrosis. EXAM: CT CHEST WITHOUT CONTRAST TECHNIQUE: Multidetector CT imaging  of the chest was performed following the standard protocol without intravenous contrast. High resolution imaging of the lungs, as well as inspiratory and expiratory imaging, was performed. COMPARISON:  No priors. FINDINGS: Cardiovascular: Heart size is borderline enlarged. There is no significant pericardial fluid, thickening or pericardial calcification. There is aortic atherosclerosis, as well as atherosclerosis of the great vessels of the mediastinum and the coronary arteries, including calcified atherosclerotic plaque in the left main, left anterior descending, left circumflex and right coronary arteries. Mild calcifications of the aortic valve. Mediastinum/Nodes: No pathologically enlarged mediastinal or hilar lymph nodes. Please note that accurate exclusion of hilar adenopathy is limited on noncontrast CT scans. Esophagus is unremarkable in appearance. No axillary lymphadenopathy. Lungs/Pleura: High-resolution images demonstrate widespread areas of septal thickening, traction bronchiectasis, and extensive honeycombing, most evident throughout the mid to lower lungs bilaterally. In addition, there is diffuse bronchial wall thickening with moderate to severe centrilobular and paraseptal emphysema. In the apex of the right upper lobe there is extensive bronchiectasis with marked thickening of the peribronchovascular interstitium, regional architectural distortion and volume loss, most compatible with chronic post infectious or inflammatory scarring. Small amount of loculated fluid in the superior aspect of the right major fissure. No acute consolidative airspace disease. Calcified granuloma in the superior segment of the left lower lobe. Upper Abdomen: Aortic atherosclerosis. Status post cholecystectomy. 2.1 cm low-attenuation lesion in the interpolar region of the left kidney, incompletely characterized on today's noncontrast CT examination, but statistically likely to represent a cyst. Musculoskeletal: There  are no aggressive appearing lytic or blastic lesions noted in the visualized portions of the skeleton. IMPRESSION: 1. The appearance of the lungs is compatible with interstitial lung disease, with a spectrum of findings classified as usual interstitial pneumonia (UIP) per ATS criteria. 2. There is also diffuse bronchial wall thickening with moderate to severe centrilobular and paraseptal emphysema; imaging findings suggestive of superimposed COPD. 3. Aortic atherosclerosis, in addition to left main and 3 vessel coronary artery disease. 4. There are calcifications of the aortic valve. Echocardiographic correlation for evaluation of potential valvular dysfunction may be warranted if clinically indicated. 5. Post infectious scarring in the apex of the right upper lobe. Aortic Atherosclerosis (ICD10-I70.0) and Emphysema (ICD10-J43.9). Electronically Signed   By: Vinnie Langton M.D.   On: 01/08/2018 08:17      Scheduled Meds: . apixaban  2.5 mg Oral BID  . azithromycin  500 mg Oral Daily   Followed by  . [START ON 01/09/2018] azithromycin  250 mg Oral Daily  . busPIRone  15 mg Oral BID  . ergocalciferol  8,000 Units Oral Once per day on Mon Wed Fri  . furosemide  20 mg Oral Daily  . gemfibrozil  600 mg Oral  BID  . mouth rinse  15 mL Mouth Rinse BID  . mesalamine  500 mg Oral BID  . nebivolol  5 mg Oral Daily  . Pirfenidone  801 mg Oral TID  . pravastatin  10 mg Oral QPM   Continuous Infusions:   LOS: 0 days    Time spent in minutes: 45  > 50% of time taken in discussing case with patient, family, Dr Ander Slade and case management    Debbe Odea, MD [facility] Pager: [email or web address] Password [email or web address] 01/08/2018, 2:06 PM

## 2018-01-08 NOTE — Care Management Obs Status (Signed)
[place] NOTIFICATION   Patient Details  Name: Brian [name] MRN: [id number] Date of Birth: 07-Aug-1928   Medicare Observation Status Notification Given:  Yes    MahabirJuliann Pulse, RN 01/08/2018, 2:07 PM

## 2018-01-08 NOTE — Progress Notes (Signed)
Assumed care of this patient. Agree with previous RN assessment. Will continue to monitor patient closely.  

## 2018-01-08 NOTE — Progress Notes (Signed)
SATURATION QUALIFICATIONS: (This note is used to comply with regulatory documentation for home oxygen)  Patient Saturations on Room Air at Rest = 85%  Patient Saturations on Room Air while Ambulating = 74%  Patient Saturations on 6 Liters of oxygen while Ambulating = 83%  Please briefly explain why patient needs home oxygen:

## 2018-01-08 NOTE — Progress Notes (Signed)
Nutrition Follow-up  DOCUMENTATION CODES:   Severe malnutrition in context of chronic illness, Underweight  INTERVENTION:  - Will order Boost Breeze BID, each supplement provides 250 kcal and 9 grams of protein. - Will order daily multivitamin with minerals.  - Diet liberalization from Heart Healthy to Regular.  - Continue to encourage PO intakes.   NUTRITION DIAGNOSIS:   Severe Malnutrition related to chronic illness(COPD) as evidenced by severe fat depletion, severe muscle depletion.  GOAL:   Patient will meet greater than or equal to 90% of their needs  MONITOR:   PO intake, Supplement acceptance, Weight trends, Labs, Skin  REASON FOR ASSESSMENT:   Malnutrition Screening Tool, Consult Assessment of nutrition requirement/status  ASSESSMENT:   84 y.o. male with medical history significant of IPF, COPD on 2L home O2 at rest, stroke, and A.fib.  Patient presented to the ED at [facility] with worsening DOE, hypoxia on exertion requiring up to 5L with walking and desating to mid 80s even with 5L O2. It was reported that worsening hypoxia has been ongoing for the past couple of weeks. Family reports patient has recently lost 4-5 lb and that he had previously lost 10 lb over the past 1 year. Patient had a feeding tube in the past, no longer in place.  No intakes documented since admission. Lunch was ordered by tech about 30 minutes ago. This RD ordered dinner to be delivered at 6:00 PM following discussion with wife and her writing down what to order patient: spaghetti with meat sauce, a side of fresh fruit, a side of cubed cheddar cheese, dinner roll, lactaid milk, and a bottle of water.   Patient mainly sleeping during RD visit; reports having a busy day and being very tired. All information received from his wife, who is at bedside. Patient ate very little of breakfast this AM. At home he typically eats 3 meals/day in addition to a protein drink that wife makes using protein powder  once/day. For the past ~1 week he has been eating ~50% of meal portions.  A typical day of meals would be Breakfast: toast with homemade jam, 2 fried eggs, a banana, and cereal Lunch: a bowl of soup with sandwich, cucumber, tomato, and onion Dinner: varies day-to-day  No difficulty chewing or swallowing at baseline but patient has had increased mucus the past ~1 week so it has been harder for him to get things down his throat. He is lactose intolerant and has tried Ensure in the past but has experienced diarrhea. Patient had a feeding tube in 2016, but it was subsequently removed.    Medications reviewed; 8000 units Drisdol three times weekly, 40 mg IV Lasix x1 dose today. Labs reviewed; K: 3.4 mmol/L, Cl: 95 mmol/L.      NUTRITION - FOCUSED PHYSICAL EXAM:    Most Recent Value  Orbital Region  Moderate depletion  Upper Arm Region  Severe depletion  Thoracic and Lumbar Region  Severe depletion  Buccal Region  Moderate depletion  Temple Region  Moderate depletion  Clavicle Bone Region  Severe depletion  Clavicle and Acromion Bone Region  Severe depletion  Scapular Bone Region  Moderate depletion  Dorsal Hand  Moderate depletion  Patellar Region  Severe depletion  Anterior Thigh Region  Severe depletion  Posterior Calf Region  Severe depletion  Edema (RD Assessment)  None  Hair  Reviewed  Eyes  Reviewed  Mouth  Reviewed  Skin  Reviewed  Nails  Reviewed       Diet Order:  Diet Order            Diet regular Room service appropriate? Yes; Fluid consistency: Thin  Diet effective now              EDUCATION NEEDS:   No education needs have been identified at this time  Skin:  Skin Assessment: Skin Integrity Issues: Skin Integrity Issues:: Stage I, Stage II Stage I: sacrum Stage II: R buttocks, R nare  Last BM:  10/17  Height:   Ht Readings from Last 1 Encounters:  09/08/17 5\' 10"  (1.778 m)    Weight:   Wt Readings from Last 1 Encounters:  01/07/18 56.2  kg    Ideal Body Weight:  75.45 kg  BMI:  Body mass index is 17.79 kg/m.  Estimated Nutritional Needs:   Kcal:  7371-0626 (30-33 kcal/kg)  Protein:  67-80 grams (1.2-1.4 grams/kg)  Fluid:  >/= 1.7 L/day     Jarome Matin, MS, RD, LDN, [facility] Inpatient Clinical Dietitian Pager # [phone] After hours/weekend pager # [phone]

## 2018-01-08 NOTE — Care Management Note (Signed)
Case Management Note  Patient Details  Name: SCHNEUR CROWSON MRN: [id number] Date of Birth: 03/02/1929  Subjective/Objective:Admitted w/Acute on chronic COPD.Noted 02 sats- Already has home 02 uses 2.5-3l continuously-[employer];spouse will have family member to bring home 02 travel tank to rm prior d/c. [facility] chosen for HHPT-rep Santiago Glad aware. Family will transport home on own. No further CM needs.                     Action/Plan:d/c home w/HHC.   Expected Discharge Date:                  Expected Discharge Plan:  [place]  In-House Referral:     Discharge planning Services  CM Consult  Post Acute Care Choice:  Durable Medical Equipment(rw, Active w/[employer]-home 02) Choice offered to:  Spouse  DME Arranged:    DME Agency:     HH Arranged:  PT HH Agency:  [facility]  Status of Service:  Completed, signed off  If discussed at [place] of Stay Meetings, dates discussed:    Additional Comments:  Dessa Phi, RN 01/08/2018, 12:23 PM

## 2018-01-08 NOTE — Progress Notes (Signed)
  Echocardiogram 2D Echocardiogram has been performed.  Bobbye Charleston 01/08/2018, 9:32 AM

## 2018-01-08 NOTE — Consult Note (Addendum)
NAME:  Brian [name], MRN:  [id number], DOB:  1928/12/21, LOS: 0 ADMISSION DATE:  01/07/2018, CONSULTATION DATE: 01/08/2018 REFERRING MD: Wynelle Cleveland, CHIEF COMPLAINT: Hypoxemia  Brief History   Patient with pulmonary fibrosis, chronic obstructive pulmonary disease who usually uses 2 L at home Follows up with a pulmonologist at [facility] Recently requiring increasing amounts of oxygen  Past Medical History  Pulmonary fibrosis COPD Hypoxemic respiratory failure Pulmonary hypertension Atrial fibrillation History of CVA  Significant Hospital Events   Hypoxia with activity  Consults: date of consult/date signed off & final recs:    Procedures (surgical and bedside):    Significant Diagnostic Tests:  CT scan of the chest reveals extensive emphysema, pulmonary fibrosis Scarring at the apices of the lungs Micro Data:    Antimicrobials:     Subjective:  He was brought in secondary to decreased appetite with about 45 pound weight loss More short of breath Increased sinus congestion and drainage  Objective   Blood pressure 125/76, pulse 83, temperature 97.6 F (36.4 C), resp. rate 16, weight 56.2 kg, SpO2 98 %.        Intake/Output Summary (Last 24 hours) at 01/08/2018 1333 Last data filed at 01/08/2018 0600 Gross per 24 hour  Intake 240 ml  Output 1350 ml  Net -1110 ml   Filed Weights   01/07/18 1306  Weight: 56.2 kg    Examination: General: Elderly gentleman, frail HENT: Moist oral mucosa, neck is supple, no JVD Lungs: Poor air movement bilaterally Cardiovascular: S1-S2 appreciated Abdomen: Bowel sounds appreciated, soft nontender Extremities: No clubbing, no edema Neuro: Awake and alert GU: Fair output  Resolved Hospital Problem list     Assessment & Plan:   Hypoxemic respiratory failure Usually on 2 L of oxygen now requiring about 5 L Did have significant desaturations that took a while to recover from According to family members at bedside, he does look  more short of breath to them compared to what his usual is at present  COPD with exacerbation Increased cough, increased mucus He does have advanced COPD at baseline CT scan does reveal extensive emphysematous changes An exacerbation may be contributing to his oxygen requirement at present  Pulmonary fibrosis This is a progressive disease It does not appear that he has an acute exacerbation at present Follow-up with his pulmonologist On pirfenidone  Pulmonary hypertension This is likely secondary to his combined obstructive lung disease and fibrosis This will contributes to functional limitation and increasing oxygen requirement  Atrial fibrillation Is on anticoagulation Rate appears controlled  Shortness of breath Unclear whether he is more short of breath than his usual  Possible upper respiratory infection Increased cough, increased sputum He does have a wet cough I do believe a course of macrolide antibiotic may help  Risk of PE is present but not significant Not tachycardic, not tachypneic, he is on anticoagulation  History of CVA  Multifactorial reason for his hypoxemia His COPD which may be exacerbated, is pulmonary fibrosis His pulmonary hypertension Elevated BNP may suggest that he has some component of failure contributing to his currently decompensated state  Disposition / Summary of Today's Plan 01/08/18   Continue lines of care We will give him a course of antibiotics, cautious diuresis Did discuss with the family about expectations of recovery with a COPD exacerbation    Diet: As tolerated DVT prophylaxis: On anticoagulation Mobility: As tolerated Code Status: Full code Family Communication: Spouse and children at bedside  Labs   CBC: Recent Labs  Lab  01/07/18 1358  WBC 6.3  HGB 13.7  HCT 41.5  MCV 100.0  PLT 030    Basic Metabolic Panel: Recent Labs  Lab 01/07/18 1358  NA 135  K 3.4*  CL 95*  CO2 31  GLUCOSE 104*  BUN 16    CREATININE 0.65  CALCIUM 8.9   GFR: Estimated Creatinine Clearance: 51.7 mL/min (by C-G formula based on SCr of 0.65 mg/dL). Recent Labs  Lab 01/07/18 1358  WBC 6.3    Liver Function Tests: No results for input(s): AST, ALT, ALKPHOS, BILITOT, PROT, ALBUMIN in the last 168 hours. No results for input(s): LIPASE, AMYLASE in the last 168 hours. No results for input(s): AMMONIA in the last 168 hours.  ABG    Component Value Date/Time   TCO2 22 01/31/2015 0917     Coagulation Profile: No results for input(s): INR, PROTIME in the last 168 hours.  Cardiac Enzymes: Recent Labs  Lab 01/07/18 1358  TROPONINI <0.03    HbA1C: Hgb A1c MFr Bld  Date/Time Value Ref Range Status  02/20/2016 02:03 PM 5.6 4.6 - 6.5 % Final    Comment:    Glycemic Control Guidelines for People with Diabetes:Non Diabetic:  <6%Goal of Therapy: <7%Additional Action Suggested:  >8%   01/31/2015 09:09 AM 5.6 4.8 - 5.6 % Final    Comment:    (NOTE)         Pre-diabetes: 5.7 - 6.4         Diabetes: >6.4         Glycemic control for adults with diabetes: <7.0     CBG: Recent Labs  Lab 01/07/18 1357  GLUCAP 100*    Admitting History of Present Illness.   His spouse got concerned as he was not eating much, not as active as he normally is Increased cough and increased drainage  Review of Systems:   Review of Systems  Constitutional: Positive for weight loss. Negative for fever.  HENT: Positive for congestion.   Eyes: Negative.   Respiratory: Positive for cough and sputum production.   Cardiovascular: Negative.   Gastrointestinal: Negative.   Genitourinary: Negative.   Musculoskeletal: Negative.   Skin: Negative.      Past Medical History  He,  has a past medical history of Anxiety, Atrial fibrillation with RVR ([place]), B12 deficiency anemia, CAP (community acquired pneumonia) (01/26/2015), COPD (chronic obstructive pulmonary disease) ([place]), Crohn's ileocolitis ([place]), Depression, ED (erectile  dysfunction), Emphysema lung ([place]) (2016), Hemorrhoids, Hiatal hernia (1956), Hypertension (11/11), Hypertriglyceridemia, Hypertrophy of prostate, Idiopathic pulmonary fibrosis ([place]) (2016), Long term (current) use of anticoagulants (02/21/2011), Melanoma of scalp ([place]), Muscle tremor, On home oxygen therapy, Osteoarthritis, Osteopenia, Peripheral neuropathy, Pneumonia (?2015), Pulmonary fibrosis ([place]) (2015), Renal cyst, Serrated adenoma of colon (03/2010), Squamous cell carcinoma of skin of scalp, Squamous cell carcinoma, face, and Stroke ([place]).   Surgical History    Past Surgical History:  Procedure Laterality Date  . APPENDECTOMY  1984  . CATARACT EXTRACTION W/ INTRAOCULAR LENS  IMPLANT, BILATERAL Bilateral 2007  . CHOLECYSTECTOMY  02/17/2011   Procedure: LAPAROSCOPIC CHOLECYSTECTOMY WITH INTRAOPERATIVE CHOLANGIOGRAM;  Surgeon: Edward Jolly, MD;  Location: [facility] ORS;  Service: General;  Laterality: N/A;  . COLON RESECTION  1984   resection of distal ileum and cecum w/ appendectomy, 18-inch small intestine, for Crohn's disease  . COLON SURGERY    . IR GENERIC HISTORICAL  05/29/2016   IR GASTROSTOMY TUBE REMOVAL 05/29/2016 Sandi Mariscal, MD [facility]-INTER[facility] RAD  . MOHS SURGERY Right ~ 2014   "  side of my scalp"     Social History   Social History   Socioeconomic History  . Marital status: Married    Spouse name: Dalene Seltzer  . Number of children: 4  . Years of education: Not on file  . Highest education level: Not on file  Occupational History  . Occupation: Retired  . Occupation: RETIRED    Employer: RETIRED  Social Needs  . Financial resource strain: Not on file  . Food insecurity:    Worry: Not on file    Inability: Not on file  . Transportation needs:    Medical: Not on file    Non-medical: Not on file  Tobacco Use  . Smoking status: Former Smoker    Packs/day: 1.00    Years: 40.00    Pack years: 40.00    Types: Cigarettes, Pipe    Last attempt to quit: 03/24/1990    Years since  quitting: 27.8  . Smokeless tobacco: Never Used  Substance and Sexual Activity  . Alcohol use: No    Alcohol/week: 0.0 standard drinks    Comment:    . Drug use: No  . Sexual activity: Never    Birth control/protection: None  Lifestyle  . Physical activity:    Days per week: Not on file    Minutes per session: Not on file  . Stress: Not on file  Relationships  . Social connections:    Talks on phone: Not on file    Gets together: Not on file    Attends religious service: Not on file    Active member of club or organization: Not on file    Attends meetings of clubs or organizations: Not on file    Relationship status: Not on file  . Intimate partner violence:    Fear of current or ex partner: Not on file    Emotionally abused: Not on file    Physically abused: Not on file    Forced sexual activity: Not on file  Other Topics Concern  . 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 His smoking use included cigarettes and pipe. He has a 40.00 pack-year smoking history. He has never used smokeless tobacco. He reports that he does not drink alcohol or use drugs.   Family History   His family history includes Breast cancer in his mother; Crohn's disease in his brother; Dementia in his sister; Diabetes in his sister; Heart attack (age of onset: 77) in his father; Stroke (age of onset: 58) in his mother. There is no history of Colon cancer or Prostate cancer.   Allergies Allergies  Allergen Reactions  . Cephalexin Nausea And Vomiting     Home Medications  Prior to Admission medications   Medication Sig Start Date End Date Taking? Authorizing Provider  apixaban (ELIQUIS) 2.5 MG TABS tablet Take 1 tablet (2.5 mg total) by mouth 2 (two) times daily. 11/17/17  Yes Josue Hector, MD  busPIRone (BUSPAR) 15 MG tablet Take 1 tablet  (15 mg total) by mouth 2 (two) times daily. 09/16/17  Yes Paz, Alda Berthold, MD  ergocalciferol (DRISDOL) 8000 UNIT/ML drops Take 1 mL (8,000 Units total) by mouth 3 (three) times a week. 05/13/17  Yes Paz, Alda Berthold, MD  ESBRIET 801 MG TABS Take 801 mg by  mouth 3 (three) times daily. 12/24/17  Yes [provider]  gemfibrozil (LOPID) 600 MG tablet Take 1 tablet (600 mg total) by mouth 2 (two) times daily. 12/28/17  Yes Paz, Alda Berthold, MD  loperamide (IMODIUM) 1 MG/5ML solution Place 10 mLs (2 mg total) into feeding tube as needed for diarrhea or loose stools. Patient taking differently: Take 2 mg by mouth 3 (three) times daily as needed for diarrhea or loose stools.  03/16/15  Yes Midge Minium, MD  mesalamine (PENTASA) 250 MG CR capsule Take 4 capsules (1,000 mg total) by mouth 2 (two) times daily. Patient taking differently: Take 500 mg by mouth 2 (two) times daily.  02/23/17  Yes Ladene Artist, MD  Multiple Vitamin (DAILY VITAMIN PO) Take 1 tablet by mouth daily.    Yes [provider]  nebivolol (BYSTOLIC) 5 MG tablet Take 1 tablet (5 mg total) by mouth daily. 08/10/17  Yes Josue Hector, MD  OXYGEN Inhale into the lungs continuous. 2 L   Yes [provider]  pravastatin (PRAVACHOL) 10 MG tablet Take 1 tablet (10 mg total) by mouth daily. Patient taking differently: Take 10 mg by mouth every evening.  09/16/17  Yes Paz, Alda Berthold, MD  Probiotic Product (PROBIOTIC DAILY PO) Take 1 tablet by mouth daily.   Yes [provider]

## 2018-01-09 DIAGNOSIS — E43 Unspecified severe protein-calorie malnutrition: Secondary | ICD-10-CM

## 2018-01-09 DIAGNOSIS — J9621 Acute and chronic respiratory failure with hypoxia: Secondary | ICD-10-CM | POA: Diagnosis not present

## 2018-01-09 DIAGNOSIS — K50819 Crohn's disease of both small and large intestine with unspecified complications: Secondary | ICD-10-CM | POA: Diagnosis not present

## 2018-01-09 DIAGNOSIS — L8990 Pressure ulcer of unspecified site, unspecified stage: Secondary | ICD-10-CM

## 2018-01-09 DIAGNOSIS — E46 Unspecified protein-calorie malnutrition: Secondary | ICD-10-CM | POA: Diagnosis not present

## 2018-01-09 DIAGNOSIS — J84112 Idiopathic pulmonary fibrosis: Secondary | ICD-10-CM | POA: Diagnosis not present

## 2018-01-09 DIAGNOSIS — I10 Essential (primary) hypertension: Secondary | ICD-10-CM | POA: Diagnosis not present

## 2018-01-09 DIAGNOSIS — I482 Chronic atrial fibrillation, unspecified: Secondary | ICD-10-CM | POA: Diagnosis not present

## 2018-01-09 LAB — BASIC METABOLIC PANEL
ANION GAP: 12 (ref 5–15)
BUN: 24 mg/dL — ABNORMAL HIGH (ref 8–23)
CHLORIDE: 90 mmol/L — AB (ref 98–111)
CO2: 30 mmol/L (ref 22–32)
Calcium: 8.9 mg/dL (ref 8.9–10.3)
Creatinine, Ser: 0.82 mg/dL (ref 0.61–1.24)
GFR calc non Af Amer: >60 mL/min (ref >60)
Glucose, Bld: 108 mg/dL — ABNORMAL HIGH (ref 70–99)
POTASSIUM: 3.5 mmol/L (ref 3.5–5.1)
SODIUM: 132 mmol/L — AB (ref 135–145)

## 2018-01-09 LAB — BLOOD GAS, ARTERIAL
ACID-BASE EXCESS: 5.8 mmol/L — AB (ref 0.0–2.0)
Bicarbonate: 30 mmol/L — ABNORMAL HIGH (ref 20.0–28.0)
DRAWN BY: 331471
O2 CONTENT: 5 L/min
O2 SAT: 94 %
PATIENT TEMPERATURE: 37
pCO2 arterial: 43.2 mmHg (ref 32.0–48.0)
pH, Arterial: 7.455 — ABNORMAL HIGH (ref 7.350–7.450)
pO2, Arterial: 80.5 mmHg — ABNORMAL LOW (ref 83.0–108.0)

## 2018-01-09 MED ORDER — ACETAMINOPHEN 325 MG PO TABS: 650.0000 mg | ORAL_TABLET | Freq: Four times a day (QID) | ORAL | Status: DC | PRN

## 2018-01-09 MED ORDER — PREDNISONE 5 MG PO TABS: 10.0000 mg | ORAL_TABLET | Freq: Two times a day (BID) | ORAL | Status: DC

## 2018-01-09 MED ORDER — AZITHROMYCIN 250 MG PO TABS: 250.0000 mg | ORAL_TABLET | Freq: Every day | ORAL | 0 refills | Status: DC

## 2018-01-09 MED ORDER — SALINE SPRAY 0.65 % NA SOLN
1.0000 | NASAL | Status: DC | PRN
  Filled 2018-01-09: qty 44

## 2018-01-09 MED ORDER — PREDNISONE 10 MG PO TABS: 10.0000 mg | ORAL_TABLET | Freq: Two times a day (BID) | ORAL | 0 refills | Status: DC

## 2018-01-09 NOTE — Progress Notes (Signed)
NAME:  Brian [name], MRN:  [id number], DOB:  1928/12/18, LOS: 0 ADMISSION DATE:  01/07/2018, CONSULTATION DATE: 01/08/2018 REFERRING MD: Dr. Wynelle Cleveland, CHIEF COMPLAINT: Hypoxemia  Brief History   Patient with pulmonary fibrosis, chronic obstructive pulmonary disease who usually uses 2 L at home Follows up with a pulmonologist at [facility] Recently requiring increasing amounts of oxygen  Past Medical History  Pulmonary fibrosis COPD Hypoxemic respiratory failure Pulmonary hypertension Atrial fibrillation History of CVA  Significant Hospital Events   Hypoxia with activity Hypoxia even at rest  Consults: date of consult/date signed off & final recs:   Procedures (surgical and bedside):    Significant Diagnostic Tests:  CT scan of the chest reveals extensive emphysema, primary fibrosis Scarring at the apices of the lungs Reviewed by myself  Micro Data:  None  Antimicrobials:  Started azithromycin 01/08/2018  Subjective:  Elderly gentleman, does appear comfortable in bed does not appear tachypneic Not voicing any complaints  Objective   Blood pressure (!) 100/58, pulse 83, temperature 98 F (36.7 C), temperature source Oral, resp. rate 18, weight 54.1 kg, SpO2 98 %.        Intake/Output Summary (Last 24 hours) at 01/09/2018 1154 Last data filed at 01/09/2018 0600 Gross per 24 hour  Intake 120 ml  Output 200 ml  Net -80 ml   Filed Weights   01/07/18 1306 01/09/18 0535  Weight: 56.2 kg 54.1 kg    Examination: General: Elderly gentleman, frail HENT: Moist oral mucosa, neck is supple Lungs: Poor air movement bilaterally Cardiovascular: S1-S2 appreciated Abdomen: Bowel sounds appreciated, soft, nontender Extremities: No edema  Resolved Hospital Problem list    Assessment & Plan:  Hypoxemic respiratory failure Usually on 2 L at home now requiring about 5 L He is desaturating even at rest We will obtain arterial blood gas to confirm desaturations at  rest  COPD with exacerbation Increased cough, increased mucus production COPD and pulmonary fibrosis at baseline Exacerbated at present  Pulmonary fibrosis Progressive disease On pirfenidone  Pulmonary hypertension Likely secondary to combined obstructive disease and pulmonary fibrosis  Atrial fibrillation  On anticoagulation   Possible upper respiratory infection  I started him on macrolide antibiotic  Multifactorial reason for his hypoxemia Pulmonary hypertension, elevated BNP may suggest component of failure  We will add short course of steroids Prednisone 10 p.o. twice daily  Disposition / Summary of Today's Plan 01/09/18   Continue course of antibiotics Cautious diuresis Steroids     Diet: As tolerated DVT prophylaxis: Fully anticoagulated Mobility: As tolerated, he has limitations with his hypoxemia Code Status: Full code Family Communication: Updated spouse at bedside  Labs   CBC: Recent Labs  Lab 01/07/18 1358  WBC 6.3  HGB 13.7  HCT 41.5  MCV 100.0  PLT 474    Basic Metabolic Panel: Recent Labs  Lab 01/07/18 1358 01/09/18 0529  NA 135 132*  K 3.4* 3.5  CL 95* 90*  CO2 31 30  GLUCOSE 104* 108*  BUN 16 24*  CREATININE 0.65 0.82  CALCIUM 8.9 8.9   GFR: Estimated Creatinine Clearance: 48.6 mL/min (by C-G formula based on SCr of 0.82 mg/dL). Recent Labs  Lab 01/07/18 1358  WBC 6.3    Liver Function Tests: No results for input(s): AST, ALT, ALKPHOS, BILITOT, PROT, ALBUMIN in the last 168 hours. No results for input(s): LIPASE, AMYLASE in the last 168 hours. No results for input(s): AMMONIA in the last 168 hours.  ABG    Component Value Date/Time  TCO2 22 01/31/2015 0917     Coagulation Profile: No results for input(s): INR, PROTIME in the last 168 hours.  Cardiac Enzymes: Recent Labs  Lab 01/07/18 1358  TROPONINI <0.03    HbA1C: Hgb A1c MFr Bld  Date/Time Value Ref Range Status  02/20/2016 02:03 PM 5.6 4.6 - 6.5 %  Final    Comment:    Glycemic Control Guidelines for People with Diabetes:Non Diabetic:  <6%Goal of Therapy: <7%Additional Action Suggested:  >8%   01/31/2015 09:09 AM 5.6 4.8 - 5.6 % Final    Comment:    (NOTE)         Pre-diabetes: 5.7 - 6.4         Diabetes: >6.4         Glycemic control for adults with diabetes: <7.0     CBG: Recent Labs  Lab 01/07/18 1357  GLUCAP 100*    Admitting History of Present Illness.   His spouse got concerned as he was not eating much, not as active as he normally is Increased cough and increased drainage  Review of Systems:   Review of Systems  Respiratory: Positive for cough. Negative for shortness of breath.   Cardiovascular: Negative for chest pain.     Past Medical History  He,  has a past medical history of Anxiety, Atrial fibrillation with RVR ([place]), B12 deficiency anemia, CAP (community acquired pneumonia) (01/26/2015), COPD (chronic obstructive pulmonary disease) ([place]), Crohn's ileocolitis ([place]), Depression, ED (erectile dysfunction), Emphysema lung ([place]) (2016), Hemorrhoids, Hiatal hernia (1956), Hypertension (11/11), Hypertriglyceridemia, Hypertrophy of prostate, Idiopathic pulmonary fibrosis ([place]) (2016), Long term (current) use of anticoagulants (02/21/2011), Melanoma of scalp ([place]), Muscle tremor, On home oxygen therapy, Osteoarthritis, Osteopenia, Peripheral neuropathy, Pneumonia (?2015), Pulmonary fibrosis ([place]) (2015), Renal cyst, Serrated adenoma of colon (03/2010), Squamous cell carcinoma of skin of scalp, Squamous cell carcinoma, face, and Stroke ([place]).   Surgical History    Past Surgical History:  Procedure Laterality Date  . APPENDECTOMY  1984  . CATARACT EXTRACTION W/ INTRAOCULAR LENS  IMPLANT, BILATERAL Bilateral 2007  . CHOLECYSTECTOMY  02/17/2011   Procedure: LAPAROSCOPIC CHOLECYSTECTOMY WITH INTRAOPERATIVE CHOLANGIOGRAM;  Surgeon: Edward Jolly, MD;  Location: [facility];  Service: General;  Laterality: N/A;  . COLON  RESECTION  1984   resection of distal ileum and cecum w/ appendectomy, 18-inch small intestine, for Crohn's disease  . COLON SURGERY    . IR GENERIC HISTORICAL  05/29/2016   IR GASTROSTOMY TUBE REMOVAL 05/29/2016 Sandi Mariscal, MD [facility]  . MOHS SURGERY Right ~ 2014   "side of my scalp"     Social History   Social History   Socioeconomic History  . Marital status: Married    Spouse name: Dalene Seltzer  . Number of children: 4  . Years of education: Not on file  . Highest education level: Not on file  Occupational History  . Occupation: Retired  . Occupation: RETIRED    Employer: RETIRED  Social Needs  . Financial resource strain: Not on file  . Food insecurity:    Worry: Not on file    Inability: Not on file  . Transportation needs:    Medical: Not on file    Non-medical: Not on file  Tobacco Use  . Smoking status: Former Smoker    Packs/day: 1.00    Years: 40.00    Pack years: 40.00    Types: Cigarettes, Pipe    Last attempt to quit: 03/24/1990    Years since quitting: 27.8  . Smokeless  tobacco: Never Used  Substance and Sexual Activity  . Alcohol use: No    Alcohol/week: 0.0 standard drinks    Comment:    . Drug use: No  . Sexual activity: Never    Birth control/protection: None  Lifestyle  . Physical activity:    Days per week: Not on file    Minutes per session: Not on file  . Stress: Not on file  Relationships  . Social connections:    Talks on phone: Not on file    Gets together: Not on file    Attends religious service: Not on file    Active member of club or organization: Not on file    Attends meetings of clubs or organizations: Not on file    Relationship status: Not on file  . Intimate partner violence:    Fear of current or ex partner: Not on file    Emotionally abused: Not on file    Physically abused: Not on file    Forced sexual activity: Not on file  Other Topics Concern  . 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 His smoking use included cigarettes and pipe. He has a 40.00 pack-year smoking history. He has never used smokeless tobacco. He reports that he does not drink alcohol or use drugs.   Family History   His family history includes Breast cancer in his mother; Crohn's disease in his brother; Dementia in his sister; Diabetes in his sister; Heart attack (age of onset: 29) in his father; Stroke (age of onset: 60) in his mother. There is no history of Colon cancer or Prostate cancer.   Allergies Allergies  Allergen Reactions  . Cephalexin Nausea And Vomiting     Home Medications  Prior to Admission medications   Medication Sig Start Date End Date Taking? Authorizing Provider  apixaban (ELIQUIS) 2.5 MG TABS tablet Take 1 tablet (2.5 mg total) by mouth 2 (two) times daily. 11/17/17  Yes Josue Hector, MD  busPIRone (BUSPAR) 15 MG tablet Take 1 tablet (15 mg total) by mouth 2 (two) times daily. 09/16/17  Yes Paz, Alda Berthold, MD  ergocalciferol (DRISDOL) 8000 UNIT/ML drops Take 1 mL (8,000 Units total) by mouth 3 (three) times a week. 05/13/17  Yes Paz, Alda Berthold, MD  ESBRIET 801 MG TABS Take 801 mg by mouth 3 (three) times daily. 12/24/17  Yes [provider]  gemfibrozil (LOPID) 600 MG tablet Take 1 tablet (600 mg total) by mouth 2 (two) times daily. 12/28/17  Yes Paz, Alda Berthold, MD  loperamide (IMODIUM) 1 MG/5ML solution Place 10 mLs (2 mg total) into feeding tube as needed for diarrhea or loose stools. Patient taking differently: Take 2 mg by mouth 3 (three) times daily as needed for diarrhea or loose stools.  03/16/15  Yes Midge Minium, MD  mesalamine (PENTASA) 250 MG CR capsule Take 4 capsules (1,000 mg total) by mouth 2 (two) times daily. Patient taking differently: Take 500 mg by mouth 2 (two) times daily.  02/23/17  Yes Ladene Artist, MD  Multiple Vitamin  (DAILY VITAMIN PO) Take 1 tablet by mouth daily.    Yes [provider]  nebivolol (BYSTOLIC) 5 MG tablet Take 1 tablet (5 mg total) by mouth daily. 08/10/17  Yes Jenkins Rouge  C, MD  OXYGEN Inhale into the lungs continuous. 2 L   Yes [provider]  pravastatin (PRAVACHOL) 10 MG tablet Take 1 tablet (10 mg total) by mouth daily. Patient taking differently: Take 10 mg by mouth every evening.  09/16/17  Yes Paz, Alda Berthold, MD  Probiotic Product (PROBIOTIC DAILY PO) Take 1 tablet by mouth daily.   Yes [provider]

## 2018-01-09 NOTE — Progress Notes (Signed)
Pt. And family has been educated on meds and future appointments. Pt. Wife expressed understanding. IV taken out, Pt. Taken out via wheelchair per [employer]

## 2018-01-09 NOTE — Discharge Summary (Signed)
Physician Discharge Summary  EASTIN SWING ZOX:[id number] DOB: 1928-07-16 DOA: 01/07/2018  PCP: Colon Branch, MD  Admit date: 01/07/2018 Discharge date: 01/09/2018  Admitted From: home Disposition:  home   Recommendations for Outpatient Follow-up:  1. F/u on pulse ox     Discharge Condition:  stable   CODE STATUS:  Full code   Consultations:  pulmonary    Discharge Diagnoses:  Principal Problem:   Acute on chronic respiratory failure with hypoxia (HCC) Active Problems:    IPF (idiopathic pulmonary fibrosis) (HCC)   A-fib (HCC)   Protein calorie malnutrition (HCC)   Protein-calorie malnutrition, severe   Pressure injury of skin   Essential hypertension   Crohn's disease ([place])  Brief Summary: Brian [name] is a 84 y.o.malewith medical history significant ofIPF and COPD on 2L home O2 at rest, prior stroke, A.fib. Patient [facility] ED at [facility] with worsening DOE, hypoxia on exertion requiring now up to 5L with walking and desating to mid 80s even with that. He admits to a loss of appetite for 2 wks with a weight loss of about 5-6 lbs.   Hospital Course:  Principal Problem:   Acute on chronic respiratory failure with hypoxia  - ? Progression of IPF or COPD in addition to severe deconditioning and weight loss - CT chest > The appearance of the lungs is compatible with interstitial lung disease, with a spectrum of findings classified as usual interstitial pneumonia (UIP) per ATS criteria. There is also diffuse bronchial wall thickening with moderate to severe centrilobular and paraseptal emphysema - cont Esbriet - ECHO shows mod pulm HTN, mild to mod MR, mod TR - pulmonary has started Azithromycin ( x 5 days) and Prednisone at 10 mg BID - he will be discharged with these medications and should return to his pulmonologist or PCP early next week to determine if Prednisone can be tapered - he has a concentrator at home and will   Active Problems:     Crohn's disease  -  mostly controlled with Pentasa- he needs to take Imodium PRN    A-fib (HCC) - cont Eliquis and Bystolic    Protein calorie malnutrition - severe- Ensure causes diarrhea- his wife gives him 2 protein supplements daily at home- I have had an extensive conversation with them about increasing his oral intake    Discharge Exam: Vitals:   01/08/18 2100 01/09/18 0535  BP: 137/83 (!) 100/58  Pulse: 91 83  Resp: 14 18  Temp: 98 F (36.7 C) 98 F (36.7 C)  SpO2: 93% 98%   Vitals:   01/08/18 0414 01/08/18 1408 01/08/18 2100 01/09/18 0535  BP: 125/76 108/71 137/83 (!) 100/58  Pulse: 83 (!) 34 91 83  Resp: 16 18 14 18   Temp: 97.6 F (36.4 C) (!) 97.5 F (36.4 C) 98 F (36.7 C) 98 F (36.7 C)  TempSrc:  Oral Oral Oral  SpO2: 98% 99% 93% 98%  Weight:    54.1 kg    General: Pt is alert, awake, not in acute distress- cachectic  Cardiovascular: RRR, S1/S2 +, no rubs, no gallops Respiratory: bilateral crackles-  no wheezing, no rhonchi Abdominal: Soft, NT, ND, bowel sounds + Extremities: no edema, no cyanosis   Discharge Instructions  Discharge Instructions    Diet - low sodium heart healthy   Complete by:  As directed    Discharge instructions   Complete by:  As directed    Oxygen level should be kept > 88 % at all  times.  Please return to your PCP next week and discuss how much Prednisone you should continue taking. Please return to see your Pulmonologist ASAP.  Continue to eat a high protein, high carb diet to help regain weight.   Increase activity slowly   Complete by:  As directed      Allergies as of 01/09/2018      Reactions   Cephalexin Nausea And Vomiting      Medication List    STOP taking these medications   OXYGEN     TAKE these medications   acetaminophen 325 MG tablet Commonly known as:  TYLENOL Take 2 tablets (650 mg total) by mouth every 6 (six) hours as needed for mild pain (or Fever >/= 101).   apixaban 2.5 MG Tabs tablet Commonly known  as:  ELIQUIS Take 1 tablet (2.5 mg total) by mouth 2 (two) times daily.   azithromycin 250 MG tablet Commonly known as:  ZITHROMAX Take 1 tablet (250 mg total) by mouth daily for 3 days. Start taking on:  01/10/2018   busPIRone 15 MG tablet Commonly known as:  BUSPAR Take 1 tablet (15 mg total) by mouth 2 (two) times daily.   DAILY VITAMIN PO Take 1 tablet by mouth daily.   ergocalciferol 8000 UNIT/ML drops Commonly known as:  DRISDOL Take 1 mL (8,000 Units total) by mouth 3 (three) times a week.   ESBRIET 801 MG Tabs Generic drug:  Pirfenidone Take 801 mg by mouth 3 (three) times daily.   gemfibrozil 600 MG tablet Commonly known as:  LOPID Take 1 tablet (600 mg total) by mouth 2 (two) times daily.   loperamide 1 MG/5ML solution Commonly known as:  IMODIUM Place 10 mLs (2 mg total) into feeding tube as needed for diarrhea or loose stools. What changed:    how to take this  when to take this   mesalamine 250 MG CR capsule Commonly known as:  PENTASA Take 4 capsules (1,000 mg total) by mouth 2 (two) times daily. What changed:  how much to take   nebivolol 5 MG tablet Commonly known as:  BYSTOLIC Take 1 tablet (5 mg total) by mouth daily.   pravastatin 10 MG tablet Commonly known as:  PRAVACHOL Take 1 tablet (10 mg total) by mouth daily. What changed:  when to take this   predniSONE 10 MG tablet Commonly known as:  DELTASONE Take 1 tablet (10 mg total) by mouth 2 (two) times daily with a meal.   PROBIOTIC DAILY PO Take 1 tablet by mouth daily.      Follow-up Information    [facility].   Specialty:  Home Health Services Why:  [facility] physical therapy Contact information: [place] [address] [phone]          Allergies  Allergen Reactions  . Cephalexin Nausea And Vomiting     Procedures/Studies:  Dg Chest 2 View  Result Date: 01/07/2018 CLINICAL DATA:  Weakness, fatigue and increased  tremors. EXAM: CHEST - 2 VIEW COMPARISON:  11/28/2016 FINDINGS: Cardiomediastinal silhouette is normal. Mediastinal contours appear intact. Chronic changes of advanced pulmonary fibrosis. Increased bilateral pleural thickening versus small pleural effusions. Right upper lobe subpleural airspace opacity again seen, not significantly changed from the prior radiographs. No evidence of pneumothorax. Osseous structures are without acute abnormality. Soft tissues are grossly normal. IMPRESSION: Findings of advanced pulmonary fibrosis, not significantly changed. Increased bilateral pleural thickening versus small pleural effusions. Electronically Signed   By:  Fidela Salisbury M.D.   On: 01/07/2018 14:56   Ct Head Wo Contrast  Result Date: 01/07/2018 CLINICAL DATA:  Two days of weakness, fatigue and increased tremors. EXAM: CT HEAD WITHOUT CONTRAST TECHNIQUE: Contiguous axial images were obtained from the base of the skull through the vertex without intravenous contrast. COMPARISON:  Brain MRI 01/31/2015 FINDINGS: Brain: Large area of encephalomalacia from prior ischemic infarct in the left MCA territory. Ex vacuo dilatation of the left lateral ventricle. No evidence of acute infarction or hemorrhage. Mild brain parenchymal volume loss. Deep white matter microangiopathy. Vascular: No hyperdense vessel or unexpected calcification. Skull: Normal. Negative for fracture or focal lesion. Sinuses/Orbits: No acute finding. Other: None. IMPRESSION: Large area of encephalomalacia from prior left posterior MCA territory ischemic infarct. No acute intracranial abnormality. Atrophy, chronic microvascular disease. Electronically Signed   By: Fidela Salisbury M.D.   On: 01/07/2018 15:00   Ct Chest High Resolution  Result Date: 01/08/2018 CLINICAL DATA:  84 year old male with history of idiopathic pulmonary fibrosis. EXAM: CT CHEST WITHOUT CONTRAST TECHNIQUE: Multidetector CT imaging of the chest was performed following  the standard protocol without intravenous contrast. High resolution imaging of the lungs, as well as inspiratory and expiratory imaging, was performed. COMPARISON:  No priors. FINDINGS: Cardiovascular: Heart size is borderline enlarged. There is no significant pericardial fluid, thickening or pericardial calcification. There is aortic atherosclerosis, as well as atherosclerosis of the great vessels of the mediastinum and the coronary arteries, including calcified atherosclerotic plaque in the left main, left anterior descending, left circumflex and right coronary arteries. Mild calcifications of the aortic valve. Mediastinum/Nodes: No pathologically enlarged mediastinal or hilar lymph nodes. Please note that accurate exclusion of hilar adenopathy is limited on noncontrast CT scans. Esophagus is unremarkable in appearance. No axillary lymphadenopathy. Lungs/Pleura: High-resolution images demonstrate widespread areas of septal thickening, traction bronchiectasis, and extensive honeycombing, most evident throughout the mid to lower lungs bilaterally. In addition, there is diffuse bronchial wall thickening with moderate to severe centrilobular and paraseptal emphysema. In the apex of the right upper lobe there is extensive bronchiectasis with marked thickening of the peribronchovascular interstitium, regional architectural distortion and volume loss, most compatible with chronic post infectious or inflammatory scarring. Small amount of loculated fluid in the superior aspect of the right major fissure. No acute consolidative airspace disease. Calcified granuloma in the superior segment of the left lower lobe. Upper Abdomen: Aortic atherosclerosis. Status post cholecystectomy. 2.1 cm low-attenuation lesion in the interpolar region of the left kidney, incompletely characterized on today's noncontrast CT examination, but statistically likely to represent a cyst. Musculoskeletal: There are no aggressive appearing lytic or  blastic lesions noted in the visualized portions of the skeleton. IMPRESSION: 1. The appearance of the lungs is compatible with interstitial lung disease, with a spectrum of findings classified as usual interstitial pneumonia (UIP) per ATS criteria. 2. There is also diffuse bronchial wall thickening with moderate to severe centrilobular and paraseptal emphysema; imaging findings suggestive of superimposed COPD. 3. Aortic atherosclerosis, in addition to left main and 3 vessel coronary artery disease. 4. There are calcifications of the aortic valve. Echocardiographic correlation for evaluation of potential valvular dysfunction may be warranted if clinically indicated. 5. Post infectious scarring in the apex of the right upper lobe. Aortic Atherosclerosis (ICD10-I70.0) and Emphysema (ICD10-J43.9). Electronically Signed   By: Vinnie Langton M.D.   On: 01/08/2018 08:17      The results of significant diagnostics from this hospitalization (including imaging, microbiology, ancillary and laboratory) are listed below for  reference.     Microbiology: No results found for this or any previous visit (from the past 240 hour(s)).   Labs: BNP (last 3 results) Recent Labs    01/07/18 1358  BNP 017.5*   Basic Metabolic Panel: Recent Labs  Lab 01/07/18 1358 01/09/18 0529  NA 135 132*  K 3.4* 3.5  CL 95* 90*  CO2 31 30  GLUCOSE 104* 108*  BUN 16 24*  CREATININE 0.65 0.82  CALCIUM 8.9 8.9   Liver Function Tests: No results for input(s): AST, ALT, ALKPHOS, BILITOT, PROT, ALBUMIN in the last 168 hours. No results for input(s): LIPASE, AMYLASE in the last 168 hours. No results for input(s): AMMONIA in the last 168 hours. CBC: Recent Labs  Lab 01/07/18 1358  WBC 6.3  HGB 13.7  HCT 41.5  MCV 100.0  PLT 177   Cardiac Enzymes: Recent Labs  Lab 01/07/18 1358  TROPONINI <0.03   BNP: Invalid input(s): POCBNP CBG: Recent Labs  Lab 01/07/18 1357  GLUCAP 100*   D-Dimer No results for  input(s): DDIMER in the last 72 hours. Hgb A1c No results for input(s): HGBA1C in the last 72 hours. Lipid Profile No results for input(s): CHOL, HDL, LDLCALC, TRIG, CHOLHDL, LDLDIRECT in the last 72 hours. Thyroid function studies No results for input(s): TSH, T4TOTAL, T3FREE, THYROIDAB in the last 72 hours.  Invalid input(s): FREET3 Anemia work up No results for input(s): VITAMINB12, FOLATE, FERRITIN, TIBC, IRON, RETICCTPCT in the last 72 hours. Urinalysis    Component Value Date/Time   COLORURINE YELLOW 01/07/2018 1520   APPEARANCEUR CLEAR 01/07/2018 1520   LABSPEC 1.010 01/07/2018 1520   PHURINE 6.5 01/07/2018 1520   GLUCOSEU NEGATIVE 01/07/2018 1520   HGBUR NEGATIVE 01/07/2018 1520   HGBUR trace-lysed 09/16/2006 [place] 01/07/2018 1520   BILIRUBINUR negative 06/10/2013 1049   KETONESUR NEGATIVE 01/07/2018 1520   PROTEINUR NEGATIVE 01/07/2018 1520   UROBILINOGEN 0.2 01/31/2015 1030   NITRITE NEGATIVE 01/07/2018 1520   LEUKOCYTESUR NEGATIVE 01/07/2018 1520   Sepsis Labs Invalid input(s): PROCALCITONIN,  WBC,  LACTICIDVEN Microbiology No results found for this or any previous visit (from the past 240 hour(s)).   Time coordinating discharge in minutes: 65  SIGNED:   Debbe Odea, MD  [facility] 01/09/2018, 1:02 PM Pager   If 7PM-7AM, please contact night-coverage [email or web address] Password [email or web address]

## 2018-01-11 ENCOUNTER — Telehealth: Payer: Self-pay

## 2018-01-11 DIAGNOSIS — J8410 Pulmonary fibrosis, unspecified: Secondary | ICD-10-CM | POA: Diagnosis not present

## 2018-01-11 DIAGNOSIS — J84112 Idiopathic pulmonary fibrosis: Secondary | ICD-10-CM | POA: Diagnosis not present

## 2018-01-11 MED ORDER — DOXYCYCLINE HYCLATE 100 MG PO TABS: 100.0000 mg | ORAL_TABLET | Freq: Two times a day (BID) | ORAL | 0 refills | Status: DC
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
## 2018-01-11 NOTE — Telephone Encounter (Signed)
Okay to switch to doxycycline 100 mg 1 tablet twice a day # 10 pills no refills

## 2018-01-11 NOTE — Telephone Encounter (Signed)
Copied from [place] [id number]. Topic: General - Other >> Jan 11, 2018  9:10 AM Yvette Rack wrote: Reason for CRM: pt is having diarrhea from the azithromycin (ZITHROMAX) 250 MG tablet and his wife would like for something else given to him the hospital gave pt this medicine wife Dalene Seltzer would like for him to be on something because she states that he doesn't need to loose anymore weight

## 2018-01-12 ENCOUNTER — Ambulatory Visit (INDEPENDENT_AMBULATORY_CARE_PROVIDER_SITE_OTHER): Payer: Medicare HMO | Admitting: Internal Medicine

## 2018-01-12 ENCOUNTER — Ambulatory Visit: Payer: Self-pay | Admitting: *Deleted

## 2018-01-12 ENCOUNTER — Encounter: Payer: Self-pay | Admitting: Internal Medicine

## 2018-01-12 ENCOUNTER — Emergency Department (HOSPITAL_BASED_OUTPATIENT_CLINIC_OR_DEPARTMENT_OTHER)
Admission: EM | Admit: 2018-01-12 | Discharge: 2018-01-12 | Disposition: A | Discharge status: Home / Self Care | Payer: Medicare HMO | Attending: Emergency Medicine | Admitting: Emergency Medicine

## 2018-01-12 ENCOUNTER — Telehealth: Payer: Self-pay | Admitting: Internal Medicine

## 2018-01-12 ENCOUNTER — Encounter (HOSPITAL_BASED_OUTPATIENT_CLINIC_OR_DEPARTMENT_OTHER): Payer: Self-pay | Admitting: *Deleted

## 2018-01-12 ENCOUNTER — Telehealth: Payer: Self-pay | Admitting: *Deleted

## 2018-01-12 ENCOUNTER — Other Ambulatory Visit: Payer: Self-pay

## 2018-01-12 VITALS — BP 116/70 | HR 66 | Temp 98.1°F | Resp 16 | Ht 70.0 in | Wt 126.0 lb

## 2018-01-12 DIAGNOSIS — R338 Other retention of urine: Secondary | ICD-10-CM

## 2018-01-12 DIAGNOSIS — Z7902 Long term (current) use of antithrombotics/antiplatelets: Secondary | ICD-10-CM | POA: Insufficient documentation

## 2018-01-12 DIAGNOSIS — R1030 Lower abdominal pain, unspecified: Secondary | ICD-10-CM | POA: Diagnosis not present

## 2018-01-12 DIAGNOSIS — Z7901 Long term (current) use of anticoagulants: Secondary | ICD-10-CM | POA: Diagnosis not present

## 2018-01-12 DIAGNOSIS — R319 Hematuria, unspecified: Secondary | ICD-10-CM | POA: Diagnosis not present

## 2018-01-12 DIAGNOSIS — I10 Essential (primary) hypertension: Secondary | ICD-10-CM | POA: Diagnosis not present

## 2018-01-12 DIAGNOSIS — Z85828 Personal history of other malignant neoplasm of skin: Secondary | ICD-10-CM | POA: Diagnosis not present

## 2018-01-12 DIAGNOSIS — J449 Chronic obstructive pulmonary disease, unspecified: Secondary | ICD-10-CM | POA: Insufficient documentation

## 2018-01-12 DIAGNOSIS — R339 Retention of urine, unspecified: Secondary | ICD-10-CM | POA: Diagnosis not present

## 2018-01-12 DIAGNOSIS — J9611 Chronic respiratory failure with hypoxia: Secondary | ICD-10-CM | POA: Diagnosis not present

## 2018-01-12 DIAGNOSIS — Z8673 Personal history of transient ischemic attack (TIA), and cerebral infarction without residual deficits: Secondary | ICD-10-CM | POA: Insufficient documentation

## 2018-01-12 DIAGNOSIS — Z87891 Personal history of nicotine dependence: Secondary | ICD-10-CM | POA: Diagnosis not present

## 2018-01-12 DIAGNOSIS — Z79899 Other long term (current) drug therapy: Secondary | ICD-10-CM | POA: Insufficient documentation

## 2018-01-12 DIAGNOSIS — N390 Urinary tract infection, site not specified: Secondary | ICD-10-CM | POA: Diagnosis not present

## 2018-01-12 LAB — URINALYSIS, MICROSCOPIC (REFLEX)
RBC / HPF: >50 RBC/hpf (ref 0–5)
WBC, UA: >50 WBC/hpf (ref 0–5)

## 2018-01-12 LAB — CBC WITH DIFFERENTIAL/PLATELET
Abs Immature Granulocytes: 0.02 10*3/uL (ref 0.00–0.07)
Basophils Absolute: 0 10*3/uL (ref 0.0–0.1)
Basophils Relative: 0 %
Eosinophils Absolute: 0.1 10*3/uL (ref 0.0–0.5)
Eosinophils Relative: 1 %
HCT: 39.6 % (ref 39.0–52.0)
Hemoglobin: 13 g/dL (ref 13.0–17.0)
Immature Granulocytes: 0 %
Lymphocytes Relative: 10 %
Lymphs Abs: 0.6 10*3/uL — ABNORMAL LOW (ref 0.7–4.0)
MCH: 32.7 pg (ref 26.0–34.0)
MCHC: 32.8 g/dL (ref 30.0–36.0)
MCV: 99.7 fL (ref 80.0–100.0)
Monocytes Absolute: 0.5 10*3/uL (ref 0.1–1.0)
Monocytes Relative: 8 %
Neutro Abs: 4.7 10*3/uL (ref 1.7–7.7)
Neutrophils Relative %: 81 %
Platelets: 164 10*3/uL (ref 150–400)
RBC: 3.97 MIL/uL — ABNORMAL LOW (ref 4.22–5.81)
RDW: 12.5 % (ref 11.5–15.5)
WBC: 5.9 10*3/uL (ref 4.0–10.5)
nRBC: 0 % (ref 0.0–0.2)

## 2018-01-12 LAB — BASIC METABOLIC PANEL
Anion gap: 12 (ref 5–15)
BUN: 38 mg/dL — ABNORMAL HIGH (ref 8–23)
CO2: 24 mmol/L (ref 22–32)
Calcium: 8.7 mg/dL — ABNORMAL LOW (ref 8.9–10.3)
Chloride: 92 mmol/L — ABNORMAL LOW (ref 98–111)
Creatinine, Ser: 0.92 mg/dL (ref 0.61–1.24)
GFR calc Af Amer: >60 mL/min (ref >60)
GFR calc non Af Amer: >60 mL/min (ref >60)
Glucose, Bld: 95 mg/dL (ref 70–99)
Potassium: 3.5 mmol/L (ref 3.5–5.1)
Sodium: 128 mmol/L — ABNORMAL LOW (ref 135–145)

## 2018-01-12 LAB — URINALYSIS, ROUTINE W REFLEX MICROSCOPIC
Bilirubin Urine: NEGATIVE
Glucose, UA: NEGATIVE mg/dL
Ketones, ur: NEGATIVE mg/dL
Nitrite: POSITIVE — AB
Protein, ur: 100 mg/dL — AB
Specific Gravity, Urine: 1.02 (ref 1.005–1.030)
pH: 6 (ref 5.0–8.0)

## 2018-01-12 MED ORDER — CIPROFLOXACIN IN D5W 400 MG/200ML IV SOLN
400.0000 mg | Freq: Once | INTRAVENOUS | Status: AC
  Administered 2018-01-12: 400 mg via INTRAVENOUS
  Filled 2018-01-12: qty 200

## 2018-01-12 MED ORDER — SODIUM CHLORIDE 0.9 % IV SOLN
INTRAVENOUS | Status: DC | PRN
  Administered 2018-01-12: 500 mL via INTRAVENOUS

## 2018-01-12 MED ORDER — CIPROFLOXACIN HCL 500 MG PO TABS: 500.0000 mg | ORAL_TABLET | Freq: Two times a day (BID) | ORAL | 0 refills | Status: DC

## 2018-01-12 MED ORDER — SODIUM CHLORIDE 0.9 % IV BOLUS
500.0000 mL | Freq: Once | INTRAVENOUS | Status: AC
  Administered 2018-01-12: 500 mL via INTRAVENOUS

## 2018-01-12 NOTE — Telephone Encounter (Signed)
Received Physician Orders from [facility]; forwarded to provider/SLS 10/22  

## 2018-01-12 NOTE — Telephone Encounter (Signed)
Pt sent to ED

## 2018-01-12 NOTE — Telephone Encounter (Signed)
Copied from [place] [phone]. Topic: Quick Communication - See Telephone Encounter >> Jan 12, 2018  2:50 PM Bea Graff, NT wrote: CRM for notification. See Telephone encounter for: 01/12/18. Jim with [facility] states pt was to be evaluated today for home health. This was delayed due to pts extended doctors appt. CB#: [phone]

## 2018-01-12 NOTE — ED Triage Notes (Signed)
Sent from his MD's office with urinary retention. He has had decreased urine output x 3 days.

## 2018-01-12 NOTE — ED Provider Notes (Signed)
[facility] EMERGENCY DEPARTMENT Provider Note   CSN: [id number] Arrival date & time: 01/12/18  1517     History   Chief Complaint Chief Complaint  Patient presents with  . Urinary Retention    HPI Brian [name] is a 84 y.o. male with history of COPD, atrial fibrillation, hypertrophy of prostate without history of urinary obstruction who presents with 3-day history of urinary retention.  Patient has had dribbling and hematuria.  He saw his PCP, Dr. Larose Kells, who sent him here for evaluation.  Patient has had pain in his lower abdomen as well as his penis and scrotum.  Patient is currently on doxycycline and prednisone after being admitted for hypoxia.  Patient had a urinary catheter during his admission for 2 days.  Patient has not had this problem in the past.  HPI  Past Medical History:  Diagnosis Date  . Anxiety   . Atrial fibrillation with RVR ([place])   . B12 deficiency anemia   . CAP (community acquired pneumonia) 01/26/2015  . COPD (chronic obstructive pulmonary disease) ([place])    recent dx--no acute problems  . Crohn's ileocolitis ([place])   . Depression   . ED (erectile dysfunction)   . Emphysema lung ([place]) 2016   Dr. [name]  . Hemorrhoids   . Hiatal hernia 1956   found while in service  . Hypertension 11/11   mild  . Hypertriglyceridemia   . Hypertrophy of prostate    w/o UR obst & oth luts  . Idiopathic pulmonary fibrosis ([place]) 2016   Dr. [name]  . Long term (current) use of anticoagulants 02/21/2011  . Melanoma of scalp ([place])   . Muscle tremor    saw neurology remotely elsewhere, was Rx inderal  . On home oxygen therapy    "2L; 20-24h for the past week" (01/26/2015)  . Osteoarthritis    "maybe in my hands, feet" (01/26/2015)  . Osteopenia    per DEXA 11/2008  . Peripheral neuropathy    h/o neg w/u  . Pneumonia ?2015  . Pulmonary fibrosis ([place]) 2015   Rx Esbriet ~ 10-2013 ([name]), Dr. [name]  . Renal cyst    bilateral  . Serrated adenoma of colon  03/2010  . Squamous cell carcinoma of skin of scalp    tx'd ~ 47yrs; "froze them off"  . Squamous cell carcinoma, face     "froze them off"  . Stroke [facility])     Patient Active Problem List   Diagnosis Date Noted  . Protein-calorie malnutrition, severe 01/09/2018  . Pressure injury of skin 01/09/2018  . Acute on chronic respiratory failure with hypoxia ([place]) 01/07/2018  . Protein calorie malnutrition ([place]) 01/07/2018  . History of stroke 05/16/2016  . Cerebral infarction due to embolism of left middle cerebral artery ([place]) 04/18/2015  . Chronic anticoagulation 04/18/2015  . HLD (hyperlipidemia) 04/18/2015  . Gait disturbance, post-stroke 03/29/2015  . A-fib ([place]) 03/13/2015  . Dysphagia, pharyngoesophageal phase 02/02/2015  . Aphasia due to recent cerebrovascular accident 02/02/2015  . CVA (cerebral infarction) 01/31/2015  . Hypokalemia   . IPF (idiopathic pulmonary fibrosis) ([place]) 01/26/2015  . PCP NOTES >>>>>>>>>>>> 12/14/2014  . Vitamin D deficiency 11/04/2013  . Vasomotor rhinitis 09/22/2013  . Crohn's disease ([place]) 07/08/2013  . Postinflammatory pulmonary fibrosis ([place]) 05/26/2013  . Anxiety state 05/20/2013  . Personal history of colonic polyps 04/12/2012  . Pulmonary nodules 03/02/2011  . Cough 03/02/2011  . Annual physical exam 12/23/2010  . Dyspnea 09/03/2010  . HEMORRHOIDS-INTERNAL 03/08/2010  .  RECTAL BLEEDING 03/08/2010  . HEMATOCHEZIA 03/06/2010  . Essential hypertension 02/04/2010  . HYPERTROPHY PROSTATE W/O UR OBST & OTH LUTS 12/10/2009  . Osteopenia 12/07/2009  . PERIPHERAL NEUROPATHY 08/29/2009  . FECAL INCONTINENCE 01/29/2009  . OSTEOARTHRITIS, ANKLE, RIGHT 11/01/2008  . ERECTILE DYSFUNCTION 12/02/2007  . TREMOR 12/02/2007  . CARCINOMA, SKIN, SQUAMOUS CELL 09/16/2006  . B12 DEFICIENCY 08/27/2006    Past Surgical History:  Procedure Laterality Date  . APPENDECTOMY  1984  . CATARACT EXTRACTION W/ INTRAOCULAR LENS  IMPLANT, BILATERAL Bilateral 2007  .  CHOLECYSTECTOMY  02/17/2011   Procedure: LAPAROSCOPIC CHOLECYSTECTOMY WITH INTRAOPERATIVE CHOLANGIOGRAM;  Surgeon: Edward Jolly, MD;  Location: [facility];  Service: General;  Laterality: N/A;  . COLON RESECTION  1984   resection of distal ileum and cecum w/ appendectomy, 18-inch small intestine, for Crohn's disease  . COLON SURGERY    . IR GENERIC HISTORICAL  05/29/2016   IR GASTROSTOMY TUBE REMOVAL 05/29/2016 Sandi Mariscal, MD [facility]-INTERV RAD  . MOHS SURGERY Right ~ 2014   "side of my scalp"        Home Medications    Prior to Admission medications   Medication Sig Start Date End Date Taking? Authorizing Provider  acetaminophen (TYLENOL) 325 MG tablet Take 2 tablets (650 mg total) by mouth every 6 (six) hours as needed for mild pain (or Fever >/= 101). 01/09/18   Debbe Odea, MD  apixaban (ELIQUIS) 2.5 MG TABS tablet Take 1 tablet (2.5 mg total) by mouth 2 (two) times daily. 11/17/17   Josue Hector, MD  busPIRone (BUSPAR) 15 MG tablet Take 1 tablet (15 mg total) by mouth 2 (two) times daily. 09/16/17   Colon Branch, MD  ciprofloxacin (CIPRO) 500 MG tablet Take 1 tablet (500 mg total) by mouth every 12 (twelve) hours. 01/12/18   Gabrille Kilbride, Bea Graff, PA-C  doxycycline (VIBRA-TABS) 100 MG tablet Take 1 tablet (100 mg total) by mouth 2 (two) times daily. 01/11/18   Colon Branch, MD  ergocalciferol (DRISDOL) 8000 UNIT/ML drops Take 1 mL (8,000 Units total) by mouth 3 (three) times a week. 05/13/17   Colon Branch, MD  ESBRIET 801 MG TABS Take 801 mg by mouth 3 (three) times daily. 12/24/17   [provider]  gemfibrozil (LOPID) 600 MG tablet Take 1 tablet (600 mg total) by mouth 2 (two) times daily. 12/28/17   Colon Branch, MD  loperamide (IMODIUM) 1 MG/5ML solution Place 10 mLs (2 mg total) into feeding tube as needed for diarrhea or loose stools. Patient taking differently: Take 2 mg by mouth 3 (three) times daily as needed for diarrhea or loose stools.  03/16/15   Midge Minium, MD    mesalamine (PENTASA) 250 MG CR capsule Take 4 capsules (1,000 mg total) by mouth 2 (two) times daily. Patient taking differently: Take 500 mg by mouth 2 (two) times daily.  02/23/17   Ladene Artist, MD  Multiple Vitamin (DAILY VITAMIN PO) Take 1 tablet by mouth daily.     [provider]  nebivolol (BYSTOLIC) 5 MG tablet Take 1 tablet (5 mg total) by mouth daily. 08/10/17   Josue Hector, MD  pravastatin (PRAVACHOL) 10 MG tablet Take 1 tablet (10 mg total) by mouth daily. Patient taking differently: Take 10 mg by mouth every evening.  09/16/17   Colon Branch, MD  predniSONE (DELTASONE) 10 MG tablet Take 1 tablet (10 mg total) by mouth 2 (two) times daily with a meal. Patient not  taking: Reported on 01/12/2018 01/09/18   Debbe Odea, MD  Probiotic Product (PROBIOTIC DAILY PO) Take 1 tablet by mouth daily.    [provider]    Family History Family History  Problem Relation Age of Onset  . Breast cancer Mother   . Stroke Mother 24  . Heart attack Father 15  . Crohn's disease Brother   . Dementia Sister   . Diabetes Sister   . Colon cancer Neg Hx   . Prostate cancer Neg Hx     Social History Social History   Tobacco Use  . Smoking status: Former Smoker    Packs/day: 1.00    Years: 40.00    Pack years: 40.00    Types: Cigarettes, Pipe    Last attempt to quit: 03/24/1990    Years since quitting: 27.8  . Smokeless tobacco: Never Used  Substance Use Topics  . Alcohol use: No    Alcohol/week: 0.0 standard drinks    Comment:    . Drug use: No     Allergies   Cephalexin   Review of Systems Review of Systems  Constitutional: Negative for chills and fever.  HENT: Negative for facial swelling and sore throat.   Respiratory: Negative for shortness of breath.   Cardiovascular: Negative for chest pain.  Gastrointestinal: Positive for abdominal pain. Negative for nausea and vomiting.  Genitourinary: Positive for decreased urine volume, difficulty urinating,  hematuria and testicular pain. Negative for dysuria.  Musculoskeletal: Negative for back pain.  Skin: Negative for rash and wound.  Neurological: Negative for headaches.  Psychiatric/Behavioral: The patient is not nervous/anxious.      Physical Exam Updated Vital Signs BP 119/62 (BP Location: Right Arm)   Pulse 82   Temp (!) 97.5 F (36.4 C) (Oral)   Resp 16   SpO2 99%   Physical Exam  Constitutional: He appears well-developed and well-nourished. No distress.  HENT:  Head: Normocephalic and atraumatic.  Mouth/Throat: Oropharynx is clear and moist. No oropharyngeal exudate.  Eyes: Pupils are equal, round, and reactive to light. Conjunctivae are normal. Right eye exhibits no discharge. Left eye exhibits no discharge. No scleral icterus.  Neck: Normal range of motion. Neck supple. No thyromegaly present.  Cardiovascular: Normal rate, regular rhythm, normal heart sounds and intact distal pulses. Exam reveals no gallop and no friction rub.  No murmur heard. Pulmonary/Chest: Effort normal and breath sounds normal. No stridor. No respiratory distress. He has no wheezes. He has no rales.  Abdominal: Soft. Bowel sounds are normal. He exhibits distension (suprapubic). There is tenderness in the suprapubic area. There is no rebound and no guarding.  Genitourinary:  Genitourinary Comments: Gross hematuria; tenderness to suprapubic region, scrotum bilaterally  Musculoskeletal: He exhibits no edema.  Lymphadenopathy:    He has no cervical adenopathy.  Neurological: He is alert. Coordination normal.  Skin: Skin is warm and dry. No rash noted. He is not diaphoretic. No pallor.  Psychiatric: He has a normal mood and affect.  Nursing note and vitals reviewed.    ED Treatments / Results  Labs (all labs ordered are listed, but only abnormal results are displayed) Labs Reviewed  URINALYSIS, ROUTINE W REFLEX MICROSCOPIC - Abnormal; Notable for the following components:      Result Value    Color, Urine AMBER (*)    APPearance CLOUDY (*)    Hgb urine dipstick LARGE (*)    Protein, ur 100 (*)    Nitrite POSITIVE (*)    Leukocytes, UA MODERATE (*)  All other components within normal limits  URINALYSIS, MICROSCOPIC (REFLEX) - Abnormal; Notable for the following components:   Bacteria, UA MANY (*)    All other components within normal limits  BASIC METABOLIC PANEL - Abnormal; Notable for the following components:   Sodium 128 (*)    Chloride 92 (*)    BUN 38 (*)    Calcium 8.7 (*)    All other components within normal limits  CBC WITH DIFFERENTIAL/PLATELET - Abnormal; Notable for the following components:   RBC 3.97 (*)    Lymphs Abs 0.6 (*)    All other components within normal limits  URINE CULTURE    EKG None  Radiology No results found.  Procedures Procedures (including critical care time)  Medications Ordered in ED Medications  0.9 %  sodium chloride infusion (500 mLs Intravenous New Bag/Given 01/12/18 1726)  sodium chloride 0.9 % bolus 500 mL (500 mLs Intravenous Bolus from Bag 01/12/18 1908)  ciprofloxacin (CIPRO) IVPB 400 mg ( Intravenous Stopped 01/12/18 1837)     Initial Impression / Assessment and Plan / ED Course  I have reviewed the triage vital signs and the nursing notes.  Pertinent labs & imaging results that were available during my care of the patient were reviewed by me and considered in my medical decision making (see chart for details).    CHA2DS2/VAS Stroke Risk Points  Current as of 22 minutes ago     5 >= 2 Points: High Risk  1 - 1.99 Points: Medium Risk  0 Points: Low Risk    This is the only CHA2DS2/VAS Stroke Risk Points available for the past  year.:  Last Change: N/A     Details    This score determines the patient's risk of having a stroke if the  patient has atrial fibrillation.       Points Metrics  0 Has Congestive Heart Failure:  No    Current as of 22 minutes ago  0 Has Vascular Disease:  No    Current as of  22 minutes ago  1 Has Hypertension:  Yes    Current as of 22 minutes ago  2 Age:  47    Current as of 22 minutes ago  0 Has Diabetes:  No    Current as of 22 minutes ago  2 Had Stroke:  Yes  Had TIA:  No  Had thromboembolism:  No    Current as of 22 minutes ago  0 Male:  No    Current as of 22 minutes ago    Patient with catheter related UTI.  BUN is mildly elevated however her GFR and creatinine are within normal limits.  Sodium 128, chloride 92.  Patient given fluids in the ED as well as first dose of Cipro.  Patient has terrible intolerance to Keflex and cannot tolerate it.  Cipro initiated and patient will be sent home with indwelling Foley catheter with follow-up to urology.  Urine culture sent.  Patient is well-appearing and afebrile.  Patient given strict return precautions.  He and his family understand and agree with plan.  Patient vitals stable throughout ED course and discharged in satisfactory condition.  Patient also evaluated by Dr. Rogene Houston who got the patient's management and agrees with plan.    Final Clinical Impressions(s) / ED Diagnoses   Final diagnoses:  Lower urinary tract infectious disease  Acute urinary retention    ED Discharge Orders         Ordered  ciprofloxacin (CIPRO) 500 MG tablet  Every 12 hours     01/12/18 1914           Frederica Kuster, PA-C 01/12/18 1939    Fredia Sorrow, MD 01/13/18 [phone]

## 2018-01-12 NOTE — Telephone Encounter (Signed)
Patient has appointment today at 1:40. Wife is calling to report that her husband is having bleeding with urination since discharge from hospital.  Reason for Disposition . Blood in urine  (Exception: could be normal menstrual bleeding)    Wife is calling to report her husband has been passing blood with urination since leaving hospital- they have an appointment today.  Answer Assessment - Initial Assessment Questions 1. SYMPTOM: "What's the main symptom you're concerned about?" (e.g., frequency, incontinence)     Blood with urination- patient was released from [facility] Saturday- patient did have catheter placed. Patient has had bleeding with urination since the removal. 2. ONSET: "When did the  bleeding  Start?"     After the removal of the catheter and has continued.   3. PAIN: "Is there any pain?" If so, ask: "How bad is it?" (Scale: 1-10; mild, moderate, severe)     Some pain with urination 4. CAUSE: "What do you think is causing the symptoms?"     Possible irritation/infection 5. OTHER SYMPTOMS: "Do you have any other symptoms?" (e.g., fever, flank pain, blood in urine, pain with urination)     Some urinary leakage 6. PREGNANCY: "Is there any chance you are pregnant?" "When was your last menstrual period?"     n/a  Protocols used: URINE - BLOOD IN-A-AH, URINARY [facility]

## 2018-01-12 NOTE — ED Notes (Signed)
Pt output 1100cc. Foley clamped off at this time.

## 2018-01-12 NOTE — Progress Notes (Signed)
Pre visit review using our clinic review tool, if applicable. No additional management support is needed unless otherwise documented below in the visit note. 

## 2018-01-12 NOTE — Discharge Instructions (Signed)
Take Cipro until completed.  Please follow-up with urology by calling their office tomorrow to schedule appointment.  You will need to keep the catheter in place until removed by urology.  Please return the emergency department if you develop any fever over 100.4, worsening pain, if the catheter stops working, or any other concerning symptoms.  If you are having catheter problems, you can also call the urologist office if it is during business hours.

## 2018-01-12 NOTE — Progress Notes (Signed)
Subjective:    Patient ID: Brian [name], male    DOB: 18-Nov-1928, 83 y.o.   MRN: [id number]  DOS:  01/12/2018 Type of visit - description : F/U Interval history: Hospital follow-up Was admitted to hospital 01/07/2018 and discharged 2 days later. Was admitted with worsening dyspnea on exertion, hypoxia.  Loss of appetite. Dx w/ Respiratory failure due to progressing IPF or COPD. Echo show moderate pulmonary hypertension.  Moderate MR and TR. Was Rx Zithromax : and prednisone 10 mg twice daily w/ consideration of a taper in the near future. Last BMP, CBC were satisfactory. CT chest:  IMPRESSION: 1. The appearance of the lungs is compatible with interstitial lung disease, with a spectrum of findings classified as usual interstitial pneumonia (UIP) per ATS criteria. 2. There is also diffuse bronchial wall thickening with moderate to severe centrilobular and paraseptal emphysema; imaging findings suggestive of superimposed COPD. 3. Aortic atherosclerosis, in addition to left main and 3 vessel coronary artery disease. 4. There are calcifications of the aortic valve. Echocardiographic correlation for evaluation of potential valvular dysfunction may be warranted if clinically indicated. 5. Post infectious scarring in the apex of the right upper lobe.  Review of Systems Since he left the hospital he is at home. No fever chills Cough he is a slightly above baseline, they have noticed also cough after eating. He has some amount of clear sputum but at least one time they noted abundant yellow thick sputum. While in the hospital, he had a bladder catheter, this was removed before he left the hospital. Since then, they have noted dripping, bloody urine coming out. Unclear if he is voiding regularly, the patient is a poor historian historian due to his overall status. Appetite is quite decreased. No edema.   Past Medical History:  Diagnosis Date  . Anxiety   . Atrial fibrillation  with RVR ([place])   . B12 deficiency anemia   . CAP (community acquired pneumonia) 01/26/2015  . COPD (chronic obstructive pulmonary disease) ([place])    recent dx--no acute problems  . Crohn's ileocolitis ([place])   . Depression   . ED (erectile dysfunction)   . Emphysema lung ([place]) 2016   Dr. [name]  . Hemorrhoids   . Hiatal hernia 1956   found while in service  . Hypertension 11/11   mild  . Hypertriglyceridemia   . Hypertrophy of prostate    w/o UR obst & oth luts  . Idiopathic pulmonary fibrosis ([place]) 2016   Dr. [name]  . Long term (current) use of anticoagulants 02/21/2011  . Melanoma of scalp ([place])   . Muscle tremor    saw neurology remotely elsewhere, was Rx inderal  . On home oxygen therapy    "2L; 20-24h for the past week" (01/26/2015)  . Osteoarthritis    "maybe in my hands, feet" (01/26/2015)  . Osteopenia    per DEXA 11/2008  . Peripheral neuropathy    h/o neg w/u  . Pneumonia ?2015  . Pulmonary fibrosis ([place]) 2015   Rx Esbriet ~ 10-2013 ([name]), Dr. [name]  . Renal cyst    bilateral  . Serrated adenoma of colon 03/2010  . Squamous cell carcinoma of skin of scalp    tx'd ~ 41yrs; "froze them off"  . Squamous cell carcinoma, face     "froze them off"  . Stroke [facility])     Past Surgical History:  Procedure Laterality Date  . APPENDECTOMY  1984  . CATARACT EXTRACTION W/ INTRAOCULAR LENS  IMPLANT, BILATERAL  Bilateral 2007  . CHOLECYSTECTOMY  02/17/2011   Procedure: LAPAROSCOPIC CHOLECYSTECTOMY WITH INTRAOPERATIVE CHOLANGIOGRAM;  Surgeon: Edward Jolly, MD;  Location: [facility]S;  Service: General;  Laterality: N/A;  . COLON RESECTION  1984   resection of distal ileum and cecum w/ appendectomy, 18-inch small intestine, for Crohn's disease  . COLON SURGERY    . IR GENERIC HISTORICAL  05/29/2016   IR GASTROSTOMY TUBE REMOVAL 05/29/2016 Sandi Mariscal, MD [facility]  . MOHS SURGERY Right ~ 2014   "side of my scalp"    Social History   Socioeconomic History  . Marital  status: Married    Spouse name: Dalene Seltzer  . Number of children: 4  . Years of education: Not on file  . Highest education level: Not on file  Occupational History  . Occupation: Retired  . Occupation: RETIRED    Employer: RETIRED  Social Needs  . Financial resource strain: Not on file  . Food insecurity:    Worry: Not on file    Inability: Not on file  . Transportation needs:    Medical: Not on file    Non-medical: Not on file  Tobacco Use  . Smoking status: Former Smoker    Packs/day: 1.00    Years: 40.00    Pack years: 40.00    Types: Cigarettes, Pipe    Last attempt to quit: 03/24/1990    Years since quitting: 27.8  . Smokeless tobacco: Never Used  Substance and Sexual Activity  . Alcohol use: No    Alcohol/week: 0.0 standard drinks    Comment:    . Drug use: No  . Sexual activity: Not Currently    Birth control/protection: None  Lifestyle  . Physical activity:    Days per week: Not on file    Minutes per session: Not on file  . Stress: Not on file  Relationships  . Social connections:    Talks on phone: Not on file    Gets together: Not on file    Attends religious service: Not on file    Active member of club or organization: Not on file    Attends meetings of clubs or organizations: Not on file    Relationship status: Not on file  . Intimate partner violence:    Fear of current or ex partner: Not on file    Emotionally abused: Not on file    Physically abused: Not on file    Forced sexual activity: Not on file  Other Topics Concern  . Not on file  Social History Narrative   Married, lives w/ wife; son [name] is a Restaurant manager, fast food, lives in [place], checks on [place] frequently     Wife drives       Allergies as of 01/12/2018      Reactions   Cephalexin Nausea And Vomiting      Medication List        Accurate as of 01/12/18 11:59 PM. Always use your most recent med list.          acetaminophen 325 MG tablet Commonly known as:  TYLENOL Take 2 tablets (650  mg total) by mouth every 6 (six) hours as needed for mild pain (or Fever >/= 101).   apixaban 2.5 MG Tabs tablet Commonly known as:  ELIQUIS Take 1 tablet (2.5 mg total) by mouth 2 (two) times daily.   busPIRone 15 MG tablet Commonly known as:  BUSPAR Take 1 tablet (15 mg total) by mouth 2 (two) times daily.  ciprofloxacin 500 MG tablet Commonly known as:  CIPRO Take 1 tablet (500 mg total) by mouth every 12 (twelve) hours.   DAILY VITAMIN PO Take 1 tablet by mouth daily.   doxycycline 100 MG tablet Commonly known as:  VIBRA-TABS Take 1 tablet (100 mg total) by mouth 2 (two) times daily.   ergocalciferol 8000 UNIT/ML drops Commonly known as:  DRISDOL Take 1 mL (8,000 Units total) by mouth 3 (three) times a week.   ESBRIET 801 MG Tabs Generic drug:  Pirfenidone Take 801 mg by mouth 3 (three) times daily.   gemfibrozil 600 MG tablet Commonly known as:  LOPID Take 1 tablet (600 mg total) by mouth 2 (two) times daily.   loperamide 1 MG/5ML solution Commonly known as:  IMODIUM Place 10 mLs (2 mg total) into feeding tube as needed for diarrhea or loose stools.   mesalamine 250 MG CR capsule Commonly known as:  PENTASA Take 4 capsules (1,000 mg total) by mouth 2 (two) times daily.   nebivolol 5 MG tablet Commonly known as:  BYSTOLIC Take 1 tablet (5 mg total) by mouth daily.   pravastatin 10 MG tablet Commonly known as:  PRAVACHOL Take 1 tablet (10 mg total) by mouth daily.   predniSONE 10 MG tablet Commonly known as:  DELTASONE Take 1 tablet (10 mg total) by mouth 2 (two) times daily with a meal.   PROBIOTIC DAILY PO Take 1 tablet by mouth daily.          Objective:   Physical Exam BP 116/70 (BP Location: Left Arm, Patient Position: Sitting, Cuff Size: Small)   Pulse 66   Temp 98.1 F (36.7 C) (Oral)   Resp 16   Ht 5\' 10"  (1.778 m)   Wt 126 lb (57.2 kg)   SpO2 91% Comment: on 4L  BMI 18.08 kg/m  General:   Elderly gentleman, on oxygen, no acute  distress, underweight appearing.  Quite debilitated, needs help transferring HEENT:  Normocephalic . Face symmetric, atraumatic Lungs:  Dry crackles bilateral bases, poor inspiratory effort. Normal respiratory effort, no intercostal retractions, no accessory muscle use. Heart: RRR,  no murmur.  no pretibial edema bilaterally  Abdomen:  Not distended, soft, slightly TTP at the suprapubic area.  I felt a mass going near to the umbilicus, suspect is a filled up bladder. GU: Penis is atraumatic, I do see small amounts of pink urine coming out. Skin: Not pale. Not jaundice Neurologic:  alert , does not follow simple commands, he remains pleasant. Speech: Essentially nonverbal. Psych--  No anxious or depressed appearing.     Assessment & Plan:    Assessment   HTN Hypertriglyceridemia Anxiety, on chronic Buspar , sx controlled  Depression 01-2015 after lost a brother  Atrial fibrillation anticoagulated CVA 01-2015: Dysphagia, aphasia, R weakness,  PANDA d/c 05-2016  Pulmonary: --Pulmonary fibrosis, UIP pattern, home oxygen (2 lt, much more O2 w/ exertion )   --Emphysema Tremors Crohn's colitis DJD Osteopenia DEXA 2010, DEXA 03-2014 T score -2.2, on calcium and vitamin D BPH Peripheral  neuropathy previous workup negative Skin cancer SCC B12 deficiency -- nascobal  H/o palpable abdominal aorta --- CT of the abdomen 06/2013 no AAA  PLAN:, Of the ER visit. Here with the wife and daughter-in-law. Resp failure: Continue doxycycline, start prednisone x 5 days, will arrange a pulmonary visit later. Currently O2 sats at home are in the 90s with 3 L, they drop to the 80s with exertion. Hematuria, urinary retention: Patient had a foley  temporarily while in the hospital, it was removed before he was d/c home; since then the wife noticed a small amount of bloody urine coming out.  On exam the bladder is distended, somewhat tender, I asked him to urinate, he was only able to make few CCs.  I  think he is in urinary retention, discussed with the ER doctor, he will go there, hopefully the diagnosis will be confirmed, if electrolytes are okay he should be able to go home with follow-up by urology. End of life care: This was also discussed, will arrange a hospice referral depending on outcome of ER visit  F2F> 35 min

## 2018-01-12 NOTE — Telephone Encounter (Signed)
Noted. Pt here in office w/ [place].

## 2018-01-12 NOTE — Patient Instructions (Signed)
Please go to the ER   Finish the antibiotics  Take prednisone twice a day x 5 days

## 2018-01-13 NOTE — Telephone Encounter (Signed)
Orders signed and faxed to [facility] at [phone]. Form sent for scanning.  

## 2018-01-13 NOTE — Assessment & Plan Note (Signed)
Here with the wife and daughter-in-law. Resp failure: Continue doxycycline, start prednisone x 5 days, will arrange a pulmonary visit later. Currently O2 sats at home are in the 90s with 3 L, they drop to the 80s with exertion. Hematuria, urinary retention: Patient had a foley  temporarily while in the hospital, it was removed before he was d/c home; since then the wife noticed a small amount of bloody urine coming out.  On exam the bladder is distended, somewhat tender, I asked him to urinate, he was only able to make few CCs.  I think he is in urinary retention, discussed with the ER doctor, he will go there, hopefully the diagnosis will be confirmed, if electrolytes are okay he should be able to go home with follow-up by urology. End of life care: This was also discussed, will arrange a hospice referral depending on outcome of ER visit  F2F> 35 min

## 2018-01-13 NOTE — Telephone Encounter (Signed)
Orders signed and faxed to [facility] at [phone]. Form sent for scanning.

## 2018-01-14 ENCOUNTER — Telehealth: Payer: Self-pay | Admitting: Internal Medicine

## 2018-01-14 DIAGNOSIS — J84112 Idiopathic pulmonary fibrosis: Secondary | ICD-10-CM | POA: Diagnosis not present

## 2018-01-14 DIAGNOSIS — G629 Polyneuropathy, unspecified: Secondary | ICD-10-CM | POA: Diagnosis not present

## 2018-01-14 DIAGNOSIS — J439 Emphysema, unspecified: Secondary | ICD-10-CM | POA: Diagnosis not present

## 2018-01-14 DIAGNOSIS — I4891 Unspecified atrial fibrillation: Secondary | ICD-10-CM | POA: Diagnosis not present

## 2018-01-14 DIAGNOSIS — J9611 Chronic respiratory failure with hypoxia: Secondary | ICD-10-CM | POA: Diagnosis not present

## 2018-01-14 DIAGNOSIS — M1991 Primary osteoarthritis, unspecified site: Secondary | ICD-10-CM | POA: Diagnosis not present

## 2018-01-14 DIAGNOSIS — E46 Unspecified protein-calorie malnutrition: Secondary | ICD-10-CM | POA: Diagnosis not present

## 2018-01-14 DIAGNOSIS — I10 Essential (primary) hypertension: Secondary | ICD-10-CM | POA: Diagnosis not present

## 2018-01-14 DIAGNOSIS — D510 Vitamin B12 deficiency anemia due to intrinsic factor deficiency: Secondary | ICD-10-CM | POA: Diagnosis not present

## 2018-01-14 DIAGNOSIS — K5090 Crohn's disease, unspecified, without complications: Secondary | ICD-10-CM | POA: Diagnosis not present

## 2018-01-14 LAB — URINE CULTURE: Culture: >=100000 — AB

## 2018-01-14 MED ORDER — SULFAMETHOXAZOLE-TRIMETHOPRIM 800-160 MG PO TABS: 1.0000 | ORAL_TABLET | Freq: Two times a day (BID) | ORAL | 0 refills | Status: DC

## 2018-01-14 NOTE — Telephone Encounter (Signed)
ER note reviewed, he has a Foley catheter for urinary retention. Has a follow-up with urology 01/27/2018. Urine culture reviewed, will stop doxycycline, Cipro and start Bactrim twice a day for 1 week (ok to crush). -- Respiratory status improved, keeping good oxygen levels at rest with 2 to 3 L, essentially his baseline.  We will see pulmonary in few weeks. -- Has an appointment with me in few weeks, recommend to call sooner if needed.

## 2018-01-15 ENCOUNTER — Telehealth: Payer: Self-pay | Admitting: *Deleted

## 2018-01-15 NOTE — Telephone Encounter (Signed)
Post ED Visit - Positive Culture Follow-up  Culture report reviewed by antimicrobial stewardship pharmacist:  []  Elenor Quinones, Pharm.D. []  Heide Guile, Pharm.D., BCPS AQ-ID []  Parks Neptune, Pharm.D., BCPS []  Alycia Rossetti, Pharm.D., BCPS []  Graham, [place].D., BCPS, AAHIVP []  Legrand Como, Pharm.D., BCPS, AAHIVP []  Salome Arnt, PharmD, BCPS []  Johnnette Gourd, PharmD, BCPS []  Hughes Better, PharmD, BCPS []  Leeroy Cha, PharmD Salome Arnt, PharmD  Positive urine culture Antibiotic changed to Bactrim by PCP and no further patient follow-up is required at this time.  Harlon Flor [facility] 01/15/2018, 10:13 AM

## 2018-01-15 NOTE — Telephone Encounter (Signed)
Received Physician Orders from [facility]; forwarded to provider/SLS 10/25

## 2018-01-18 ENCOUNTER — Telehealth: Payer: Self-pay | Admitting: Internal Medicine

## 2018-01-18 DIAGNOSIS — M1991 Primary osteoarthritis, unspecified site: Secondary | ICD-10-CM | POA: Diagnosis not present

## 2018-01-18 DIAGNOSIS — J84112 Idiopathic pulmonary fibrosis: Secondary | ICD-10-CM | POA: Diagnosis not present

## 2018-01-18 DIAGNOSIS — I10 Essential (primary) hypertension: Secondary | ICD-10-CM | POA: Diagnosis not present

## 2018-01-18 DIAGNOSIS — E46 Unspecified protein-calorie malnutrition: Secondary | ICD-10-CM | POA: Diagnosis not present

## 2018-01-18 DIAGNOSIS — J439 Emphysema, unspecified: Secondary | ICD-10-CM | POA: Diagnosis not present

## 2018-01-18 DIAGNOSIS — D510 Vitamin B12 deficiency anemia due to intrinsic factor deficiency: Secondary | ICD-10-CM | POA: Diagnosis not present

## 2018-01-18 DIAGNOSIS — I4891 Unspecified atrial fibrillation: Secondary | ICD-10-CM | POA: Diagnosis not present

## 2018-01-18 DIAGNOSIS — G629 Polyneuropathy, unspecified: Secondary | ICD-10-CM | POA: Diagnosis not present

## 2018-01-18 DIAGNOSIS — K5090 Crohn's disease, unspecified, without complications: Secondary | ICD-10-CM | POA: Diagnosis not present

## 2018-01-18 DIAGNOSIS — J9611 Chronic respiratory failure with hypoxia: Secondary | ICD-10-CM | POA: Diagnosis not present

## 2018-01-18 NOTE — Telephone Encounter (Signed)
Please advise 

## 2018-01-18 NOTE — Telephone Encounter (Signed)
Copied from [place] [id number]. Topic: Quick Communication - Home Health Verbal Orders >> Jan 18, 2018  9:53 AM Rutherford Nail, NT wrote: Caller/AgencyPenni Bombard with [facility] Number: [phone] Requesting OT/PT/Skilled Nursing/Social Work: Nursing Frequency:   Would like the okay to send out a nurse for foley care.  Jeannine PT with [facility] calling and states that according to the computer, gemfibrozil and prevastatin have a drug interaction severity level 1  CB#: [phone]

## 2018-01-18 NOTE — Telephone Encounter (Signed)
I agree, there is a interaction however the patient has been taking both medicines for many years without problems for the diagnosis of dyslipidemia including hypertriglyceridemia.. At this point , he is 17 has multiple other medical problems, recommend to stop gemfibrozil and check triglycerides on RTC.  Okay orders for PT, OT, etc.

## 2018-01-18 NOTE — Telephone Encounter (Signed)
Spoke w/ Ashby Dawes, verbal orders given, and informed okay to stop gemfibrozil.

## 2018-01-19 DIAGNOSIS — I4891 Unspecified atrial fibrillation: Secondary | ICD-10-CM | POA: Diagnosis not present

## 2018-01-19 DIAGNOSIS — J439 Emphysema, unspecified: Secondary | ICD-10-CM | POA: Diagnosis not present

## 2018-01-19 DIAGNOSIS — I10 Essential (primary) hypertension: Secondary | ICD-10-CM | POA: Diagnosis not present

## 2018-01-19 DIAGNOSIS — M1991 Primary osteoarthritis, unspecified site: Secondary | ICD-10-CM | POA: Diagnosis not present

## 2018-01-19 DIAGNOSIS — E46 Unspecified protein-calorie malnutrition: Secondary | ICD-10-CM | POA: Diagnosis not present

## 2018-01-19 DIAGNOSIS — J84112 Idiopathic pulmonary fibrosis: Secondary | ICD-10-CM | POA: Diagnosis not present

## 2018-01-19 DIAGNOSIS — J9611 Chronic respiratory failure with hypoxia: Secondary | ICD-10-CM | POA: Diagnosis not present

## 2018-01-19 DIAGNOSIS — D510 Vitamin B12 deficiency anemia due to intrinsic factor deficiency: Secondary | ICD-10-CM | POA: Diagnosis not present

## 2018-01-19 DIAGNOSIS — G629 Polyneuropathy, unspecified: Secondary | ICD-10-CM | POA: Diagnosis not present

## 2018-01-19 DIAGNOSIS — K5090 Crohn's disease, unspecified, without complications: Secondary | ICD-10-CM | POA: Diagnosis not present

## 2018-01-20 ENCOUNTER — Telehealth: Payer: Self-pay | Admitting: Internal Medicine

## 2018-01-20 DIAGNOSIS — E46 Unspecified protein-calorie malnutrition: Secondary | ICD-10-CM | POA: Diagnosis not present

## 2018-01-20 DIAGNOSIS — M1991 Primary osteoarthritis, unspecified site: Secondary | ICD-10-CM | POA: Diagnosis not present

## 2018-01-20 DIAGNOSIS — G629 Polyneuropathy, unspecified: Secondary | ICD-10-CM | POA: Diagnosis not present

## 2018-01-20 DIAGNOSIS — K5090 Crohn's disease, unspecified, without complications: Secondary | ICD-10-CM | POA: Diagnosis not present

## 2018-01-20 DIAGNOSIS — J9611 Chronic respiratory failure with hypoxia: Secondary | ICD-10-CM | POA: Diagnosis not present

## 2018-01-20 DIAGNOSIS — I10 Essential (primary) hypertension: Secondary | ICD-10-CM | POA: Diagnosis not present

## 2018-01-20 DIAGNOSIS — D510 Vitamin B12 deficiency anemia due to intrinsic factor deficiency: Secondary | ICD-10-CM | POA: Diagnosis not present

## 2018-01-20 DIAGNOSIS — J439 Emphysema, unspecified: Secondary | ICD-10-CM | POA: Diagnosis not present

## 2018-01-20 DIAGNOSIS — J84112 Idiopathic pulmonary fibrosis: Secondary | ICD-10-CM | POA: Diagnosis not present

## 2018-01-20 DIAGNOSIS — I4891 Unspecified atrial fibrillation: Secondary | ICD-10-CM | POA: Diagnosis not present

## 2018-01-20 NOTE — Telephone Encounter (Signed)
Copied from [place]. Topic: Quick Communication - Home Health Verbal Orders >> Jan 20, 2018  4:35 PM Cecelia Byars, NT wrote: Caller/Agency:[facility]/ [facility]  Callback Number: [phone]   ok to lv msg   Requesting/Skilled Nursing Frequency:  For 8 weeks

## 2018-01-21 NOTE — Telephone Encounter (Signed)
LMOM w/ verbal orders.  

## 2018-01-22 ENCOUNTER — Telehealth: Payer: Self-pay | Admitting: *Deleted

## 2018-01-22 DIAGNOSIS — K5090 Crohn's disease, unspecified, without complications: Secondary | ICD-10-CM | POA: Diagnosis not present

## 2018-01-22 DIAGNOSIS — I10 Essential (primary) hypertension: Secondary | ICD-10-CM | POA: Diagnosis not present

## 2018-01-22 DIAGNOSIS — J84112 Idiopathic pulmonary fibrosis: Secondary | ICD-10-CM | POA: Diagnosis not present

## 2018-01-22 DIAGNOSIS — D510 Vitamin B12 deficiency anemia due to intrinsic factor deficiency: Secondary | ICD-10-CM | POA: Diagnosis not present

## 2018-01-22 DIAGNOSIS — J9611 Chronic respiratory failure with hypoxia: Secondary | ICD-10-CM | POA: Diagnosis not present

## 2018-01-22 DIAGNOSIS — I4891 Unspecified atrial fibrillation: Secondary | ICD-10-CM | POA: Diagnosis not present

## 2018-01-22 DIAGNOSIS — J439 Emphysema, unspecified: Secondary | ICD-10-CM | POA: Diagnosis not present

## 2018-01-22 DIAGNOSIS — G629 Polyneuropathy, unspecified: Secondary | ICD-10-CM | POA: Diagnosis not present

## 2018-01-22 DIAGNOSIS — M1991 Primary osteoarthritis, unspecified site: Secondary | ICD-10-CM | POA: Diagnosis not present

## 2018-01-22 DIAGNOSIS — E46 Unspecified protein-calorie malnutrition: Secondary | ICD-10-CM | POA: Diagnosis not present

## 2018-01-22 NOTE — Telephone Encounter (Signed)
Received Physician Orders from [facility]; forwarded to provider/SLS 11/01

## 2018-01-25 NOTE — Telephone Encounter (Signed)
Orders signed and faxed to [facility] at [phone]. Form sent for scanning.  

## 2018-01-26 ENCOUNTER — Telehealth: Payer: Self-pay | Admitting: *Deleted

## 2018-01-26 NOTE — Telephone Encounter (Signed)
Received Home Health Certification and Plan of Care; forwarded to provider/SLS 11/05  

## 2018-01-27 DIAGNOSIS — R310 Gross hematuria: Secondary | ICD-10-CM | POA: Diagnosis not present

## 2018-01-27 DIAGNOSIS — R338 Other retention of urine: Secondary | ICD-10-CM | POA: Diagnosis not present

## 2018-01-27 DIAGNOSIS — N401 Enlarged prostate with lower urinary tract symptoms: Secondary | ICD-10-CM | POA: Diagnosis not present

## 2018-01-27 DIAGNOSIS — R3914 Feeling of incomplete bladder emptying: Secondary | ICD-10-CM | POA: Diagnosis not present

## 2018-01-28 ENCOUNTER — Telehealth: Payer: Self-pay | Admitting: Internal Medicine

## 2018-01-28 DIAGNOSIS — F329 Major depressive disorder, single episode, unspecified: Secondary | ICD-10-CM

## 2018-01-28 DIAGNOSIS — J9611 Chronic respiratory failure with hypoxia: Secondary | ICD-10-CM | POA: Diagnosis not present

## 2018-01-28 DIAGNOSIS — Z9181 History of falling: Secondary | ICD-10-CM

## 2018-01-28 DIAGNOSIS — N281 Cyst of kidney, acquired: Secondary | ICD-10-CM

## 2018-01-28 DIAGNOSIS — Z7901 Long term (current) use of anticoagulants: Secondary | ICD-10-CM

## 2018-01-28 DIAGNOSIS — I4891 Unspecified atrial fibrillation: Secondary | ICD-10-CM | POA: Diagnosis not present

## 2018-01-28 DIAGNOSIS — D510 Vitamin B12 deficiency anemia due to intrinsic factor deficiency: Secondary | ICD-10-CM | POA: Diagnosis not present

## 2018-01-28 DIAGNOSIS — E46 Unspecified protein-calorie malnutrition: Secondary | ICD-10-CM

## 2018-01-28 DIAGNOSIS — J84112 Idiopathic pulmonary fibrosis: Secondary | ICD-10-CM | POA: Diagnosis not present

## 2018-01-28 DIAGNOSIS — Z87891 Personal history of nicotine dependence: Secondary | ICD-10-CM

## 2018-01-28 DIAGNOSIS — K5090 Crohn's disease, unspecified, without complications: Secondary | ICD-10-CM | POA: Diagnosis not present

## 2018-01-28 DIAGNOSIS — G629 Polyneuropathy, unspecified: Secondary | ICD-10-CM | POA: Diagnosis not present

## 2018-01-28 DIAGNOSIS — I10 Essential (primary) hypertension: Secondary | ICD-10-CM | POA: Diagnosis not present

## 2018-01-28 DIAGNOSIS — F419 Anxiety disorder, unspecified: Secondary | ICD-10-CM

## 2018-01-28 DIAGNOSIS — M8580 Other specified disorders of bone density and structure, unspecified site: Secondary | ICD-10-CM | POA: Diagnosis not present

## 2018-01-28 DIAGNOSIS — M1991 Primary osteoarthritis, unspecified site: Secondary | ICD-10-CM | POA: Diagnosis not present

## 2018-01-28 DIAGNOSIS — Z85828 Personal history of other malignant neoplasm of skin: Secondary | ICD-10-CM

## 2018-01-28 NOTE — Telephone Encounter (Signed)
Refill request for ergocalciferol 259mcg/mL drops. Okay to refill?

## 2018-01-28 NOTE — Telephone Encounter (Signed)
rx sent

## 2018-01-28 NOTE — Telephone Encounter (Signed)
Plan of care signed and faxed to [facility] at [phone]. Form sent for scanning.

## 2018-02-01 ENCOUNTER — Telehealth: Payer: Self-pay

## 2018-02-01 NOTE — Telephone Encounter (Signed)
Copied from [place] [id number]. Topic: General - Inquiry >> Feb 01, 2018 11:14 AM Conception Chancy, NT wrote: Reason for CRM: Dalene Seltzer is calling in regards to her husband and would like someone to come out from hospice and talk with them. This morning patient does not want to get up. Please advise

## 2018-02-01 NOTE — Telephone Encounter (Addendum)
Please ask one of our RNs to triaged the situation: if he is not getting up because of an acute issue (pneumonia, UTI, urinary retention?):  Needs to go to the ER Otherwise hospice referral is appropriate

## 2018-02-01 NOTE — Telephone Encounter (Signed)
Please advise 

## 2018-02-01 NOTE — Telephone Encounter (Signed)
Orders signed and faxed to [facility] at [phone]. Forms sent for scanning.  

## 2018-02-02 NOTE — Telephone Encounter (Signed)
thx

## 2018-02-02 NOTE — Telephone Encounter (Signed)
Author phoned [facility] to f/u husband's condition. Dalene Seltzer stated he has "bounced back, all gung ho". Wife denied confusion or other UTI sx and "is eating better". Pt. has appointment tmr. with urology to remove catheter. Dalene Seltzer stated today was the last visit from [facility], but still doing therapy. "If anything changes, you'll be hearing from me! Say hi to Dr. Larose Kells for me!". Routed to Dr. Larose Kells.

## 2018-02-03 DIAGNOSIS — R338 Other retention of urine: Secondary | ICD-10-CM | POA: Diagnosis not present

## 2018-02-04 ENCOUNTER — Telehealth: Payer: Self-pay | Admitting: Internal Medicine

## 2018-02-04 ENCOUNTER — Telehealth: Payer: Self-pay

## 2018-02-04 DIAGNOSIS — J9621 Acute and chronic respiratory failure with hypoxia: Secondary | ICD-10-CM

## 2018-02-04 DIAGNOSIS — J84112 Idiopathic pulmonary fibrosis: Secondary | ICD-10-CM

## 2018-02-04 DIAGNOSIS — E43 Unspecified severe protein-calorie malnutrition: Secondary | ICD-10-CM

## 2018-02-04 DIAGNOSIS — K50819 Crohn's disease of both small and large intestine with unspecified complications: Secondary | ICD-10-CM

## 2018-02-04 DIAGNOSIS — Z8673 Personal history of transient ischemic attack (TIA), and cerebral infarction without residual deficits: Secondary | ICD-10-CM

## 2018-02-04 NOTE — Telephone Encounter (Signed)
Copied from [place] [phone]. Topic: General - Other >> Feb 04, 2018  2:46 PM Lennox Solders wrote: Reason for CRM: [facility]ina referral specialist with [facility] is calling and would like to know if dr Larose Kells will be attending of records for this patient

## 2018-02-04 NOTE — Telephone Encounter (Signed)
Spoke w/ Lisabeth Pick- will allow hospice to attend.

## 2018-02-04 NOTE — Telephone Encounter (Signed)
Copied from [place] [id number]. Topic: General - Other >> Feb 04, 2018 12:07 PM Vernona Rieger wrote: Reason for CRM: Patient's wife called and stated that his oxygen is still going up and down. She said that yesterday she took him to the urologist to get a check up on his catheter. She said that he could not urinate without it so they put it back in. She said that Dr Larose Kells had talked to her about hospice before, she would like to see if she could start services with hospice so she can get more help with the oxygen. She said that [facility] does come out and that they are wonderful. Please contact patient's wife. Thanks

## 2018-02-04 NOTE — Telephone Encounter (Signed)
Referral placed.

## 2018-02-04 NOTE — Telephone Encounter (Signed)
Okay, please arrange a hospice referral

## 2018-02-05 DIAGNOSIS — J8410 Pulmonary fibrosis, unspecified: Secondary | ICD-10-CM | POA: Diagnosis not present

## 2018-02-05 DIAGNOSIS — F339 Major depressive disorder, recurrent, unspecified: Secondary | ICD-10-CM | POA: Diagnosis not present

## 2018-02-05 DIAGNOSIS — E43 Unspecified severe protein-calorie malnutrition: Secondary | ICD-10-CM | POA: Diagnosis not present

## 2018-02-05 DIAGNOSIS — J449 Chronic obstructive pulmonary disease, unspecified: Secondary | ICD-10-CM | POA: Diagnosis not present

## 2018-02-05 DIAGNOSIS — I4891 Unspecified atrial fibrillation: Secondary | ICD-10-CM | POA: Diagnosis not present

## 2018-02-05 DIAGNOSIS — N401 Enlarged prostate with lower urinary tract symptoms: Secondary | ICD-10-CM | POA: Diagnosis not present

## 2018-02-05 DIAGNOSIS — R4701 Aphasia: Secondary | ICD-10-CM | POA: Diagnosis not present

## 2018-02-05 DIAGNOSIS — K50919 Crohn's disease, unspecified, with unspecified complications: Secondary | ICD-10-CM | POA: Diagnosis not present

## 2018-02-05 DIAGNOSIS — E785 Hyperlipidemia, unspecified: Secondary | ICD-10-CM | POA: Diagnosis not present

## 2018-02-05 DIAGNOSIS — I679 Cerebrovascular disease, unspecified: Secondary | ICD-10-CM | POA: Diagnosis not present

## 2018-02-08 ENCOUNTER — Inpatient Hospital Stay (HOSPITAL_COMMUNITY)
Admission: EM | Admit: 2018-02-08 | Discharge: 2018-02-21 | DRG: 189 | Disposition: E | Attending: Internal Medicine | Admitting: Internal Medicine

## 2018-02-08 ENCOUNTER — Encounter (HOSPITAL_COMMUNITY): Payer: Self-pay

## 2018-02-08 ENCOUNTER — Other Ambulatory Visit: Payer: Self-pay

## 2018-02-08 ENCOUNTER — Emergency Department (HOSPITAL_COMMUNITY)

## 2018-02-08 DIAGNOSIS — Z87891 Personal history of nicotine dependence: Secondary | ICD-10-CM

## 2018-02-08 DIAGNOSIS — Z803 Family history of malignant neoplasm of breast: Secondary | ICD-10-CM

## 2018-02-08 DIAGNOSIS — J449 Chronic obstructive pulmonary disease, unspecified: Secondary | ICD-10-CM | POA: Diagnosis not present

## 2018-02-08 DIAGNOSIS — K5090 Crohn's disease, unspecified, without complications: Secondary | ICD-10-CM | POA: Diagnosis present

## 2018-02-08 DIAGNOSIS — J9622 Acute and chronic respiratory failure with hypercapnia: Secondary | ICD-10-CM | POA: Diagnosis present

## 2018-02-08 DIAGNOSIS — I679 Cerebrovascular disease, unspecified: Secondary | ICD-10-CM | POA: Diagnosis not present

## 2018-02-08 DIAGNOSIS — Z7901 Long term (current) use of anticoagulants: Secondary | ICD-10-CM | POA: Diagnosis not present

## 2018-02-08 DIAGNOSIS — Z515 Encounter for palliative care: Secondary | ICD-10-CM | POA: Diagnosis present

## 2018-02-08 DIAGNOSIS — I499 Cardiac arrhythmia, unspecified: Secondary | ICD-10-CM | POA: Diagnosis not present

## 2018-02-08 DIAGNOSIS — Z8249 Family history of ischemic heart disease and other diseases of the circulatory system: Secondary | ICD-10-CM

## 2018-02-08 DIAGNOSIS — R0689 Other abnormalities of breathing: Secondary | ICD-10-CM | POA: Diagnosis not present

## 2018-02-08 DIAGNOSIS — K5080 Crohn's disease of both small and large intestine without complications: Secondary | ICD-10-CM | POA: Diagnosis present

## 2018-02-08 DIAGNOSIS — G9341 Metabolic encephalopathy: Secondary | ICD-10-CM | POA: Diagnosis present

## 2018-02-08 DIAGNOSIS — Z823 Family history of stroke: Secondary | ICD-10-CM | POA: Diagnosis not present

## 2018-02-08 DIAGNOSIS — I4891 Unspecified atrial fibrillation: Secondary | ICD-10-CM | POA: Diagnosis present

## 2018-02-08 DIAGNOSIS — I482 Chronic atrial fibrillation, unspecified: Secondary | ICD-10-CM | POA: Diagnosis present

## 2018-02-08 DIAGNOSIS — Z8673 Personal history of transient ischemic attack (TIA), and cerebral infarction without residual deficits: Secondary | ICD-10-CM

## 2018-02-08 DIAGNOSIS — J8410 Pulmonary fibrosis, unspecified: Secondary | ICD-10-CM | POA: Diagnosis not present

## 2018-02-08 DIAGNOSIS — I10 Essential (primary) hypertension: Secondary | ICD-10-CM | POA: Diagnosis present

## 2018-02-08 DIAGNOSIS — J9621 Acute and chronic respiratory failure with hypoxia: Principal | ICD-10-CM | POA: Diagnosis present

## 2018-02-08 DIAGNOSIS — R4701 Aphasia: Secondary | ICD-10-CM | POA: Diagnosis not present

## 2018-02-08 DIAGNOSIS — R0603 Acute respiratory distress: Secondary | ICD-10-CM | POA: Diagnosis not present

## 2018-02-08 DIAGNOSIS — E785 Hyperlipidemia, unspecified: Secondary | ICD-10-CM | POA: Diagnosis present

## 2018-02-08 DIAGNOSIS — E43 Unspecified severe protein-calorie malnutrition: Secondary | ICD-10-CM | POA: Diagnosis not present

## 2018-02-08 DIAGNOSIS — Z66 Do not resuscitate: Secondary | ICD-10-CM | POA: Diagnosis present

## 2018-02-08 DIAGNOSIS — Z833 Family history of diabetes mellitus: Secondary | ICD-10-CM

## 2018-02-08 DIAGNOSIS — J84112 Idiopathic pulmonary fibrosis: Secondary | ICD-10-CM | POA: Diagnosis present

## 2018-02-08 DIAGNOSIS — F329 Major depressive disorder, single episode, unspecified: Secondary | ICD-10-CM | POA: Diagnosis present

## 2018-02-08 DIAGNOSIS — R0902 Hypoxemia: Secondary | ICD-10-CM | POA: Diagnosis not present

## 2018-02-08 DIAGNOSIS — R627 Adult failure to thrive: Secondary | ICD-10-CM | POA: Diagnosis present

## 2018-02-08 DIAGNOSIS — N400 Enlarged prostate without lower urinary tract symptoms: Secondary | ICD-10-CM | POA: Diagnosis present

## 2018-02-08 DIAGNOSIS — R0602 Shortness of breath: Secondary | ICD-10-CM | POA: Diagnosis not present

## 2018-02-08 LAB — CBC WITH DIFFERENTIAL/PLATELET
ABS IMMATURE GRANULOCYTES: 0.04 10*3/uL (ref 0.00–0.07)
BLASTS: 0 %
Band Neutrophils: 0 %
Basophils Absolute: 0 10*3/uL (ref 0.0–0.1)
Basophils Relative: 0 %
Eosinophils Absolute: 0 10*3/uL (ref 0.0–0.5)
Eosinophils Relative: 0 %
HEMATOCRIT: 40.8 % (ref 39.0–52.0)
Hemoglobin: 13.1 g/dL (ref 13.0–17.0)
Immature Granulocytes: 0 %
LYMPHS PCT: 3 %
Lymphs Abs: 0.3 10*3/uL — ABNORMAL LOW (ref 0.7–4.0)
MCH: 32.3 pg (ref 26.0–34.0)
MCHC: 32.1 g/dL (ref 30.0–36.0)
MCV: 100.5 fL — AB (ref 80.0–100.0)
Metamyelocytes Relative: 0 %
Monocytes Absolute: 0.2 10*3/uL (ref 0.1–1.0)
Monocytes Relative: 2 %
Myelocytes: 0 %
Neutro Abs: 9.7 10*3/uL — ABNORMAL HIGH (ref 1.7–7.7)
Neutrophils Relative %: 95 %
OTHER: 0 %
PROMYELOCYTES RELATIVE: 0 %
Platelets: 260 10*3/uL (ref 150–400)
RBC: 4.06 MIL/uL — ABNORMAL LOW (ref 4.22–5.81)
RDW: 13.2 % (ref 11.5–15.5)
WBC MORPHOLOGY: INCREASED
WBC: 10.2 10*3/uL (ref 4.0–10.5)
nRBC: 0 % (ref 0.0–0.2)
nRBC: 0 /100 WBC

## 2018-02-08 LAB — I-STAT ARTERIAL BLOOD GAS, ED
Acid-base deficit: 1 mmol/L (ref 0.0–2.0)
Bicarbonate: 26.7 mmol/L (ref 20.0–28.0)
O2 SAT: 85 %
PCO2 ART: 55.1 mmHg — AB (ref 32.0–48.0)
PO2 ART: 56 mmHg — AB (ref 83.0–108.0)
Patient temperature: 98.3
TCO2: 28 mmol/L (ref 22–32)
pH, Arterial: 7.293 — ABNORMAL LOW (ref 7.350–7.450)

## 2018-02-08 LAB — COMPREHENSIVE METABOLIC PANEL
ALT: 11 U/L (ref 0–44)
ANION GAP: 9 (ref 5–15)
AST: 21 U/L (ref 15–41)
Albumin: 2.4 g/dL — ABNORMAL LOW (ref 3.5–5.0)
Alkaline Phosphatase: 127 U/L — ABNORMAL HIGH (ref 38–126)
BILIRUBIN TOTAL: 0.5 mg/dL (ref 0.3–1.2)
BUN: 13 mg/dL (ref 8–23)
CALCIUM: 8.3 mg/dL — AB (ref 8.9–10.3)
CO2: 25 mmol/L (ref 22–32)
Chloride: 99 mmol/L (ref 98–111)
Creatinine, Ser: 0.74 mg/dL (ref 0.61–1.24)
Glucose, Bld: 144 mg/dL — ABNORMAL HIGH (ref 70–99)
Potassium: 2.8 mmol/L — ABNORMAL LOW (ref 3.5–5.1)
Sodium: 133 mmol/L — ABNORMAL LOW (ref 135–145)
TOTAL PROTEIN: 7.4 g/dL (ref 6.5–8.1)

## 2018-02-08 LAB — I-STAT TROPONIN, ED: TROPONIN I, POC: 0 ng/mL (ref 0.00–0.08)

## 2018-02-08 LAB — BRAIN NATRIURETIC PEPTIDE: B Natriuretic Peptide: 235 pg/mL — ABNORMAL HIGH (ref 0.0–100.0)

## 2018-02-08 MED ORDER — SODIUM CHLORIDE 0.9 % IV BOLUS
1500.0000 mL | Freq: Once | INTRAVENOUS | Status: AC
  Administered 2018-02-08: 1500 mL via INTRAVENOUS

## 2018-02-08 MED ORDER — DM-GUAIFENESIN ER 30-600 MG PO TB12: 1.0000 | ORAL_TABLET | Freq: Two times a day (BID) | ORAL | Status: DC | PRN

## 2018-02-08 MED ORDER — NEBIVOLOL HCL 5 MG PO TABS
5.0000 mg | ORAL_TABLET | Freq: Every day | ORAL | Status: DC
  Filled 2018-02-08: qty 1

## 2018-02-08 MED ORDER — SODIUM CHLORIDE 0.9 % IV SOLN
INTRAVENOUS | Status: DC
  Administered 2018-02-08 – 2018-02-09 (×3): via INTRAVENOUS

## 2018-02-08 MED ORDER — MAGNESIUM SULFATE IN D5W 1-5 GM/100ML-% IV SOLN
1.0000 g | Freq: Once | INTRAVENOUS | Status: AC
  Administered 2018-02-08: 1 g via INTRAVENOUS
  Filled 2018-02-08: qty 100

## 2018-02-08 MED ORDER — APIXABAN 2.5 MG PO TABS
2.5000 mg | ORAL_TABLET | Freq: Two times a day (BID) | ORAL | Status: DC
  Administered 2018-02-08 – 2018-02-09 (×2): 2.5 mg via ORAL
  Filled 2018-02-08 (×3): qty 1

## 2018-02-08 MED ORDER — LEVALBUTEROL HCL 1.25 MG/0.5ML IN NEBU
1.2500 mg | INHALATION_SOLUTION | Freq: Four times a day (QID) | RESPIRATORY_TRACT | Status: DC
  Administered 2018-02-08 – 2018-02-09 (×5): 1.25 mg via RESPIRATORY_TRACT
  Filled 2018-02-08 (×5): qty 0.5

## 2018-02-08 MED ORDER — METHYLPREDNISOLONE SODIUM SUCC 125 MG IJ SOLR
125.0000 mg | Freq: Once | INTRAMUSCULAR | Status: AC
  Administered 2018-02-08: 125 mg via INTRAVENOUS
  Filled 2018-02-08: qty 2

## 2018-02-08 MED ORDER — LOPERAMIDE HCL 1 MG/7.5ML PO SUSP
2.0000 mg | Freq: Three times a day (TID) | ORAL | Status: DC | PRN
  Filled 2018-02-08: qty 10
  Filled 2018-02-08 (×2): qty 15

## 2018-02-08 MED ORDER — TAMSULOSIN HCL 0.4 MG PO CAPS
0.4000 mg | ORAL_CAPSULE | Freq: Every day | ORAL | Status: DC
  Administered 2018-02-08: 0.4 mg via ORAL
  Filled 2018-02-08: qty 1

## 2018-02-08 MED ORDER — PIRFENIDONE 801 MG PO TABS: 801.0000 mg | ORAL_TABLET | Freq: Three times a day (TID) | ORAL | Status: DC

## 2018-02-08 MED ORDER — PRAVASTATIN SODIUM 10 MG PO TABS: 10.0000 mg | ORAL_TABLET | Freq: Every evening | ORAL | Status: DC

## 2018-02-08 MED ORDER — POTASSIUM CHLORIDE 10 MEQ/100ML IV SOLN
10.0000 meq | INTRAVENOUS | Status: AC
  Administered 2018-02-08 (×2): 10 meq via INTRAVENOUS
  Filled 2018-02-08 (×2): qty 100

## 2018-02-08 MED ORDER — MESALAMINE ER 250 MG PO CPCR
500.0000 mg | ORAL_CAPSULE | Freq: Two times a day (BID) | ORAL | Status: DC
  Administered 2018-02-08: 500 mg via ORAL
  Filled 2018-02-08 (×2): qty 2

## 2018-02-08 MED ORDER — IPRATROPIUM BROMIDE 0.02 % IN SOLN
0.5000 mg | RESPIRATORY_TRACT | Status: DC
  Administered 2018-02-08 – 2018-02-09 (×5): 0.5 mg via RESPIRATORY_TRACT
  Filled 2018-02-08 (×5): qty 2.5

## 2018-02-08 MED ORDER — BUSPIRONE HCL 15 MG PO TABS
15.0000 mg | ORAL_TABLET | Freq: Two times a day (BID) | ORAL | Status: DC
  Administered 2018-02-08: 15 mg via ORAL
  Filled 2018-02-08 (×2): qty 1

## 2018-02-08 MED ORDER — POTASSIUM CHLORIDE 20 MEQ/15ML (10%) PO SOLN: 40.0000 meq | Freq: Once | ORAL | Status: DC

## 2018-02-08 MED ORDER — LEVALBUTEROL HCL 1.25 MG/0.5ML IN NEBU
1.2500 mg | INHALATION_SOLUTION | Freq: Once | RESPIRATORY_TRACT | Status: AC
  Administered 2018-02-08: 1.25 mg via RESPIRATORY_TRACT
  Filled 2018-02-08: qty 0.5

## 2018-02-08 NOTE — ED Provider Notes (Signed)
[facility] EMERGENCY DEPARTMENT Provider Note   CSN: [id number] Arrival date & time: 01/31/2018  0321     History   Chief Complaint Chief Complaint  Patient presents with  . Respiratory Distress    HPI Brian [name] is a 84 y.o. male.  Patient presents to the emergency department from home by ambulance.  Patient is accompanied by his wife.  She called EMS because of respiratory distress.  Patient reportedly became very short of breath after lying down.  Patient's wife is his primary caregiver.  He was recently hospitalized and upon discharge entered hospice care.  He is on oxygen at home and has pulse oximetry.  She reports that he has been dropping when he ambulates over the past month or so, but he became severely hypoxic tonight.  Fire rescue report oxygen saturations in the 60% range on 15 L. EMS initiated CPAP.  He was administered albuterol.  EMS report that initially he was not moving air well, but after albuterol began to exhibit severe rales and rhonchi.  Oxygen saturations have improved on CPAP. Level V Caveat due to acuity.     Past Medical History:  Diagnosis Date  . Anxiety   . Atrial fibrillation with RVR ([place])   . B12 deficiency anemia   . CAP (community acquired pneumonia) 01/26/2015  . COPD (chronic obstructive pulmonary disease) ([place])    recent dx--no acute problems  . Crohn's ileocolitis ([place])   . Depression   . ED (erectile dysfunction)   . Emphysema lung ([place]) 2016   Dr. [name]  . Hemorrhoids   . Hiatal hernia 1956   found while in service  . Hypertension 11/11   mild  . Hypertriglyceridemia   . Hypertrophy of prostate    w/o UR obst & oth luts  . Idiopathic pulmonary fibrosis ([place]) 2016   Dr. [name]  . Long term (current) use of anticoagulants 02/21/2011  . Melanoma of scalp ([place])   . Muscle tremor    saw neurology remotely elsewhere, was Rx inderal  . On home oxygen therapy    "2L; 20-24h for the past week" (01/26/2015)  .  Osteoarthritis    "maybe in my hands, feet" (01/26/2015)  . Osteopenia    per DEXA 11/2008  . Peripheral neuropathy    h/o neg w/u  . Pneumonia ?2015  . Pulmonary fibrosis ([place]) 2015   Rx Esbriet ~ 10-2013 ([name]), Dr. [name]  . Renal cyst    bilateral  . Serrated adenoma of colon 03/2010  . Squamous cell carcinoma of skin of scalp    tx'd ~ 68yrs; "froze them off"  . Squamous cell carcinoma, face     "froze them off"  . Stroke [facility])     Patient Active Problem List   Diagnosis Date Noted  . Protein-calorie malnutrition, severe 01/09/2018  . Pressure injury of skin 01/09/2018  . Acute on chronic respiratory failure with hypoxia ([place]) 01/07/2018  . Protein calorie malnutrition ([place]) 01/07/2018  . History of stroke 05/16/2016  . Cerebral infarction due to embolism of left middle cerebral artery ([place]) 04/18/2015  . Chronic anticoagulation 04/18/2015  . HLD (hyperlipidemia) 04/18/2015  . Gait disturbance, post-stroke 03/29/2015  . A-fib ([place]) 03/13/2015  . Dysphagia, pharyngoesophageal phase 02/02/2015  . Aphasia due to recent cerebrovascular accident 02/02/2015  . CVA (cerebral infarction) 01/31/2015  . Hypokalemia   . IPF (idiopathic pulmonary fibrosis) ([place]) 01/26/2015  . PCP NOTES >>>>>>>>>>>> 12/14/2014  . Vitamin D deficiency 11/04/2013  .  Vasomotor rhinitis 09/22/2013  . Crohn's disease ([place]) 07/08/2013  . Postinflammatory pulmonary fibrosis ([place]) 05/26/2013  . Anxiety state 05/20/2013  . Personal history of colonic polyps 04/12/2012  . Pulmonary nodules 03/02/2011  . Cough 03/02/2011  . Annual physical exam 12/23/2010  . Dyspnea 09/03/2010  . HEMORRHOIDS-INTERNAL 03/08/2010  . RECTAL BLEEDING 03/08/2010  . HEMATOCHEZIA 03/06/2010  . Essential hypertension 02/04/2010  . HYPERTROPHY PROSTATE W/O UR OBST & OTH LUTS 12/10/2009  . Osteopenia 12/07/2009  . PERIPHERAL NEUROPATHY 08/29/2009  . FECAL INCONTINENCE 01/29/2009  . OSTEOARTHRITIS, ANKLE, RIGHT 11/01/2008  .  ERECTILE DYSFUNCTION 12/02/2007  . TREMOR 12/02/2007  . CARCINOMA, SKIN, SQUAMOUS CELL 09/16/2006  . B12 DEFICIENCY 08/27/2006    Past Surgical History:  Procedure Laterality Date  . APPENDECTOMY  1984  . CATARACT EXTRACTION W/ INTRAOCULAR LENS  IMPLANT, BILATERAL Bilateral 2007  . CHOLECYSTECTOMY  02/17/2011   Procedure: LAPAROSCOPIC CHOLECYSTECTOMY WITH INTRAOPERATIVE CHOLANGIOGRAM;  Surgeon: Edward Jolly, MD;  Location: [facility] ORS;  Service: General;  Laterality: N/A;  . COLON RESECTION  1984   resection of distal ileum and cecum w/ appendectomy, 18-inch small intestine, for Crohn's disease  . COLON SURGERY    . IR GENERIC HISTORICAL  05/29/2016   IR GASTROSTOMY TUBE REMOVAL 05/29/2016 Sandi Mariscal, MD [facility]-INTERV RAD  . MOHS SURGERY Right ~ 2014   "side of my scalp"        Home Medications    Prior to Admission medications   Medication Sig Start Date End Date Taking? Authorizing Provider  acetaminophen (TYLENOL) 325 MG tablet Take 2 tablets (650 mg total) by mouth every 6 (six) hours as needed for mild pain (or Fever >/= 101). 01/09/18  Yes Debbe Odea, MD  apixaban (ELIQUIS) 2.5 MG TABS tablet Take 1 tablet (2.5 mg total) by mouth 2 (two) times daily. 11/17/17  Yes Josue Hector, MD  busPIRone (BUSPAR) 15 MG tablet Take 1 tablet (15 mg total) by mouth 2 (two) times daily. 09/16/17  Yes Paz, Alda Berthold, MD  ergocalciferol (DRISDOL) 200 MCG/ML drops TAKE 1 ML (8,000 UNITS TOTAL) BY MOUTH 3 (THREE) TIMES A WEEK. Patient taking differently: Take 8,000 Units by mouth every Monday, Wednesday, and Friday.  01/28/18  Yes Paz, Alda Berthold, MD  ESBRIET 801 MG TABS Take 801 mg by mouth 3 (three) times daily. 12/24/17  Yes [provider]  loperamide (IMODIUM) 1 MG/5ML solution Place 10 mLs (2 mg total) into feeding tube as needed for diarrhea or loose stools. Patient taking differently: Take 2 mg by mouth 3 (three) times daily as needed for diarrhea or loose stools.  03/16/15  Yes Midge Minium, MD  mesalamine (PENTASA) 250 MG CR capsule Take 4 capsules (1,000 mg total) by mouth 2 (two) times daily. Patient taking differently: Take 500 mg by mouth 2 (two) times daily.  02/23/17  Yes Ladene Artist, MD  Multiple Vitamin (DAILY VITAMIN PO) Take 1 tablet by mouth daily.    Yes [provider]  nebivolol (BYSTOLIC) 5 MG tablet Take 1 tablet (5 mg total) by mouth daily. 08/10/17  Yes Josue Hector, MD  pravastatin (PRAVACHOL) 10 MG tablet Take 1 tablet (10 mg total) by mouth daily. Patient taking differently: Take 10 mg by mouth every evening.  09/16/17  Yes Paz, Alda Berthold, MD  Probiotic Product (PROBIOTIC DAILY PO) Take 1 tablet by mouth daily.   Yes [provider]  tamsulosin (FLOMAX) 0.4 MG CAPS capsule Take 0.4 mg by mouth at  bedtime. 01/27/18  Yes [provider]  predniSONE (DELTASONE) 10 MG tablet Take 1 tablet (10 mg total) by mouth 2 (two) times daily with a meal. Patient not taking: Reported on 01/23/2018 01/09/18   Debbe Odea, MD  sulfamethoxazole-trimethoprim (BACTRIM DS,SEPTRA DS) 800-160 MG tablet Take 1 tablet by mouth 2 (two) times daily. Patient not taking: Reported on 02/10/2018 01/14/18   Colon Branch, MD    Family History Family History  Problem Relation Age of Onset  . Breast cancer Mother   . Stroke Mother 32  . Heart attack Father 26  . Crohn's disease Brother   . Dementia Sister   . Diabetes Sister   . Colon cancer Neg Hx   . Prostate cancer Neg Hx     Social History Social History   Tobacco Use  . Smoking status: Former Smoker    Packs/day: 1.00    Years: 40.00    Pack years: 40.00    Types: Cigarettes, Pipe    Last attempt to quit: 03/24/1990    Years since quitting: 27.8  . Smokeless tobacco: Never Used  Substance Use Topics  . Alcohol use: No    Alcohol/week: 0.0 standard drinks    Comment:    . Drug use: No     Allergies   Cephalexin   Review of Systems Review of Systems  Unable to perform  ROS: Acuity of condition     Physical Exam Updated Vital Signs BP (!) 89/54   Pulse 97   Temp 98.3 F (36.8 C) (Axillary)   Resp (!) 23   SpO2 (!) 86%   Physical Exam  Constitutional:  thin  HENT:  Head: Normocephalic and atraumatic.  Right Ear: Hearing normal.  Left Ear: Hearing normal.  Nose: Nose normal.  Mouth/Throat: Oropharynx is clear and moist and mucous membranes are normal.  Eyes: Pupils are equal, round, and reactive to light. Conjunctivae and EOM are normal.  Neck: Normal range of motion. Neck supple.  Cardiovascular: S1 normal and S2 normal. An irregularly irregular rhythm present. Tachycardia present. Exam reveals no gallop and no friction rub.  No murmur heard. Pulmonary/Chest: Effort normal. No respiratory distress. He has rhonchi (throughout). Rales: throughout. He exhibits no tenderness.  Abdominal: Soft. Normal appearance and bowel sounds are normal. There is no hepatosplenomegaly. There is no tenderness. There is no rebound, no guarding, no tenderness at McBurney's point and negative Murphy's sign. No hernia.  Musculoskeletal: Normal range of motion.  Neurological: He is alert. He has normal strength. He is disoriented. No cranial nerve deficit or sensory deficit. Coordination normal.  Skin: Skin is warm, dry and intact. No rash noted. No cyanosis.  Psychiatric: He has a normal mood and affect. His speech is normal and behavior is normal. Thought content normal.  Nursing note and vitals reviewed.    ED Treatments / Results  Labs (all labs ordered are listed, but only abnormal results are displayed) Labs Reviewed  CBC WITH DIFFERENTIAL/PLATELET - Abnormal; Notable for the following components:      Result Value   RBC 4.06 (*)    MCV 100.5 (*)    Neutro Abs 9.7 (*)    Lymphs Abs 0.3 (*)    All other components within normal limits  COMPREHENSIVE METABOLIC PANEL - Abnormal; Notable for the following components:   Sodium 133 (*)    Potassium 2.8 (*)      Glucose, Bld 144 (*)    Calcium 8.3 (*)    Albumin 2.4 (*)  Alkaline Phosphatase 127 (*)    All other components within normal limits  BRAIN NATRIURETIC PEPTIDE - Abnormal; Notable for the following components:   B Natriuretic Peptide 235.0 (*)    All other components within normal limits  I-STAT ARTERIAL BLOOD GAS, ED - Abnormal; Notable for the following components:   pH, Arterial 7.293 (*)    pCO2 arterial 55.1 (*)    pO2, Arterial 56.0 (*)    All other components within normal limits  BLOOD GAS, ARTERIAL  I-STAT TROPONIN, ED    EKG EKG Interpretation  Date/Time:  Monday February 08 2018 03:36:10 EST Ventricular Rate:  89 PR Interval:    QRS Duration: 99 QT Interval:  295 QTC Calculation: 359 R Axis:   73 Text Interpretation:  Atrial fibrillation Anterior infarct, old Repol abnrm, severe global ischemia (LM/MVD) Confirmed by Orpah Greek [phone]) on 01/28/2018 3:42:32 AM   Radiology Dg Chest Port 1 View  Result Date: 01/23/2018 CLINICAL DATA:  Respiratory distress starting in our ago. History of COPD and pulmonary fibrosis. EXAM: PORTABLE CHEST 1 VIEW COMPARISON:  01/07/2018 FINDINGS: Heart size and pulmonary vascularity are normal. Interstitial infiltrates throughout the lungs similar to previous study consistent with history of pulmonary fibrosis. Mild blunting of costophrenic angles is probably due to pleural thickening. Pleural thickening demonstrated in the right apex. No focal consolidation in the lungs. No pneumothorax. Mediastinal contours appear intact. IMPRESSION: Chronic fibrosis in the lungs. No evidence of active pulmonary disease. Electronically Signed   By: Lucienne Capers M.D.   On: 02/16/2018 03:42    Procedures Procedures (including critical care time)  Medications Ordered in ED Medications  potassium chloride 10 mEq in 100 mL IVPB (10 mEq Intravenous New Bag/Given 02/11/2018 0503)  methylPREDNISolone sodium succinate (SOLU-MEDROL) 125 mg/2  mL injection 125 mg (125 mg Intravenous Given 01/23/2018 0422)  levalbuterol (XOPENEX) nebulizer solution 1.25 mg (1.25 mg Nebulization Given 02/05/2018 0404)     Initial Impression / Assessment and Plan / ED Course  I have reviewed the triage vital signs and the nursing notes.  Pertinent labs & imaging results that were available during my care of the patient were reviewed by me and considered in my medical decision making (see chart for details).     Patient with history of severe pulmonary fibrosis and COPD presents to the emergency department for evaluation of shortness of breath.  Patient normally on 6 L by nasal cannula at all times.  Wife reports over the last month she has noticed worsening hypoxia.  His oxygen saturation significantly dropped with minimal exertion.  Tonight he became acutely severely dyspneic and hypoxic.  First responders report oxygen saturations in the 60% range despite being placed on a nonrebreather facemask.  He was brought to the ER on CPAP with improvement of his oxygenation.  EMS report that the patient initially was not moving air well, was given albuterol and opened up, but then exhibited rales and rhonchi.  BiPAP was continued here in the ER.  Blood gas reveals hypoxia with mild CO2 retention.  Chest x-ray shows severe pulmonary fibrosis, no acute pathology.  Patient continues to have adequate air movement bilaterally with significant rhonchi.  Patient administered Solu-Medrol and Xopenex.  He is in atrial fibrillation which is chronic.  Patient is on Eliquis.  Wife reports that he has been compliant with his Eliquis, therefore feel that PE is less likely.  The degree of patient's chronic lung disease would explain his dyspnea.  Patient initiated hospice care this  week.  Wife reports that he is a DNR.  Will require hospitalization for further management.  CRITICAL CARE Performed by: Orpah Greek   Total critical care time: 35 minutes  Critical care  time was exclusive of separately billable procedures and treating other patients.  Critical care was necessary to treat or prevent imminent or life-threatening deterioration.  Critical care was time spent personally by me on the following activities: development of treatment plan with patient and/or surrogate as well as nursing, discussions with consultants, evaluation of patient's response to treatment, examination of patient, obtaining history from patient or surrogate, ordering and performing treatments and interventions, ordering and review of laboratory studies, ordering and review of radiographic studies, pulse oximetry and re-evaluation of patient's condition.   Final Clinical Impressions(s) / ED Diagnoses   Final diagnoses:  Pulmonary fibrosis ([place])  Acute on chronic respiratory failure with hypoxia and hypercapnia [facility])    ED Discharge Orders    None       , Gwenyth Allegra, MD 01/29/2018 [phone]

## 2018-02-08 NOTE — ED Triage Notes (Signed)
Pt comes via [facility] for resp distress that started about an hour ago, sudden onset, rales in all lobes, hospice pt, DNR but unable to find it. Hx of COPD and pulmonary fibrosis. Wears 6L all the time. On bipap, in afib. PTA received 5mg  albuterol

## 2018-02-08 NOTE — Progress Notes (Signed)
Pt transported from ED to 2W22 on Bipap. No complications.

## 2018-02-08 NOTE — Progress Notes (Signed)
[facility] [place] ([place]) - RN Note  At 0116 this morning, Patient wife called the [facility] answering service to report patient respiratory distress (O2 sats of 78% and HR 99).. Patient was medicated with Clonazepam at that time. RN visited patiient the home at 0200 this morning to find patient in respiratory distress with O2 sats dropping to 72-76% on 10L via [place] and minimal air exchange in lung fields with patient complaints of productive cough and SOB. Patient wife requested to go to the hospital so RN called 911 and awaited for EMS to arrive. [facility] medical team aware.   Checked in with ER RN for report of patient status and give a copy of patient Medication list. Visited patient to support/comfort patient and notify him that [facility] will follow him during his hospitalization. No visitors in the room at this time. [facility] SW updated on patient condition and plans to visit patient this afternoon.   Please call with Hospice related questions as needed,  Shant Hence, RN [facility] Liaison [place] found on AMION

## 2018-02-08 NOTE — Progress Notes (Signed)
ABG results given to MD at this time.

## 2018-02-08 NOTE — H&P (Signed)
History and Physical    Brian [name] JJK:[id number] DOB: [birth year] DOA: 01/28/2018  PCP: Colon Branch, MD Consultants:  Hospice Patient coming from: Home - lives with wife   Chief Complaint: respiratory distress  HPI: Brian [name] is a 84 y.o. male with medical history significant of severe COPD on 6 L oxygen, pulmonary fibrosis, hypertension, stroke, depression, anxiety, atrial fibrillation on Eliquis, Crohn's disease, BPH, who presents acute on chronic respiratory distress with hypoxia.  The patient was alert and appropriate but minimally able to provide history while on BIPAP.  His baseline mental status is not clear.  He was unaccompanied.  His hospice diagnosis is pulmonary fibrosis.   ED Course: [place], per Dr. Blaine Hamper: ABG with pH 7.293, PCO2 53, PO2 56. Chest x-ray is negative for infiltration. Blood pressures are soft. Will give NS saline bolus now. Per EDP, patient recently entered hospice care and is DNR, which is confirmed by his wife. Admitted to stepdown as inpatient.  Review of Systems: Unable to obtain  PMH, PSH, SH, and FH were reviewed in Epic  Past Medical History:  Diagnosis Date  . Anxiety   . Atrial fibrillation with RVR ([place])   . B12 deficiency anemia   . CAP (community acquired pneumonia) 01/26/2015  . COPD (chronic obstructive pulmonary disease) ([place])    recent dx--no acute problems  . Crohn's ileocolitis ([place])   . Depression   . ED (erectile dysfunction)   . Emphysema lung ([place]) 2016   Dr. [name]  . Hemorrhoids   . Hiatal hernia 1956   found while in service  . Hypertension 11/11   mild  . Hypertriglyceridemia   . Hypertrophy of prostate    w/o UR obst & oth luts  . Idiopathic pulmonary fibrosis ([place]) 2016   Dr. [name]  . Long term (current) use of anticoagulants 02/21/2011  . Melanoma of scalp ([place])   . Muscle tremor    saw neurology remotely elsewhere, was Rx inderal  . On home oxygen therapy    "2L; 20-24h for the past week" (01/26/2015)    . Osteoarthritis    "maybe in my hands, feet" (01/26/2015)  . Osteopenia    per DEXA 11/2008  . Peripheral neuropathy    h/o neg w/u  . Pneumonia ?2015  . Pulmonary fibrosis ([place]) 2015   Rx Esbriet ~ 10-2013 ([name]), Dr. [name]  . Renal cyst    bilateral  . Serrated adenoma of colon 03/2010  . Squamous cell carcinoma of skin of scalp    tx'd ~ 42yrs; "froze them off"  . Squamous cell carcinoma, face     "froze them off"  . Stroke [facility])     Past Surgical History:  Procedure Laterality Date  . APPENDECTOMY  1984  . CATARACT EXTRACTION W/ INTRAOCULAR LENS  IMPLANT, BILATERAL Bilateral 2007  . CHOLECYSTECTOMY  02/17/2011   Procedure: LAPAROSCOPIC CHOLECYSTECTOMY WITH INTRAOPERATIVE CHOLANGIOGRAM;  Surgeon: Edward Jolly, MD;  Location: [facility];  Service: General;  Laterality: N/A;  . COLON RESECTION  1984   resection of distal ileum and cecum w/ appendectomy, 18-inch small intestine, for Crohn's disease  . COLON SURGERY    . IR GENERIC HISTORICAL  05/29/2016   IR GASTROSTOMY TUBE REMOVAL 05/29/2016 Sandi Mariscal, MD [facility]  . MOHS SURGERY Right ~ 2014   "side of my scalp"    Social History   Socioeconomic History  . Marital status: Married    Spouse name: Dalene Seltzer  . Number of children: 4  .  Years of education: Not on file  . Highest education level: Not on file  Occupational History  . Occupation: Retired  . Occupation: RETIRED    Employer: RETIRED  Social Needs  . Financial resource strain: Not on file  . Food insecurity:    Worry: Not on file    Inability: Not on file  . Transportation needs:    Medical: Not on file    Non-medical: Not on file  Tobacco Use  . Smoking status: Former Smoker    Packs/day: 1.00    Years: 40.00    Pack years: 40.00    Types: Cigarettes, Pipe    Last attempt to quit: 03/24/1990    Years since quitting: 27.8  . Smokeless tobacco: Never Used  Substance and Sexual Activity  . Alcohol use: No    Alcohol/week: 0.0 standard drinks     Comment:    . Drug use: No  . Sexual activity: Not Currently    Birth control/protection: None  Lifestyle  . Physical activity:    Days per week: Not on file    Minutes per session: Not on file  . Stress: Not on file  Relationships  . Social connections:    Talks on phone: Not on file    Gets together: Not on file    Attends religious service: Not on file    Active member of club or organization: Not on file    Attends meetings of clubs or organizations: Not on file    Relationship status: Not on file  . Intimate partner violence:    Fear of current or ex partner: Not on file    Emotionally abused: Not on file    Physically abused: Not on file    Forced sexual activity: Not on file  Other Topics Concern  . 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 Cephalexin Nausea And Vomiting    Family History  Problem Relation Age of Onset  . Breast cancer Mother   . Stroke Mother 56  . Heart attack Father 51  . Crohn's disease Brother   . Dementia Sister   . Diabetes Sister   . Colon cancer Neg Hx   . Prostate cancer Neg Hx     Prior to Admission medications   Medication Sig Start Date End Date Taking? Authorizing Provider  acetaminophen (TYLENOL) 325 MG tablet Take 2 tablets (650 mg total) by mouth every 6 (six) hours as needed for mild pain (or Fever >/= 101). 01/09/18  Yes Debbe Odea, MD  apixaban (ELIQUIS) 2.5 MG TABS tablet Take 1 tablet (2.5 mg total) by mouth 2 (two) times daily. 11/17/17  Yes Josue Hector, MD  busPIRone (BUSPAR) 15 MG tablet Take 1 tablet (15 mg total) by mouth 2 (two) times daily. 09/16/17  Yes Paz, Alda Berthold, MD  ergocalciferol (DRISDOL) 200 MCG/ML drops TAKE 1 ML (8,000 UNITS TOTAL) BY MOUTH 3 (THREE) TIMES A WEEK. Patient taking differently: Take 8,000 Units by mouth every Monday, Wednesday, and Friday.   01/28/18  Yes Paz, Alda Berthold, MD  ESBRIET 801 MG TABS Take 801 mg by mouth 3 (three) times daily. 12/24/17  Yes [provider]  loperamide (IMODIUM) 1 MG/5ML solution Place 10 mLs (2 mg total) into feeding tube as needed for diarrhea or loose stools.  Patient taking differently: Take 2 mg by mouth 3 (three) times daily as needed for diarrhea or loose stools.  03/16/15  Yes Midge Minium, MD  mesalamine (PENTASA) 250 MG CR capsule Take 4 capsules (1,000 mg total) by mouth 2 (two) times daily. Patient taking differently: Take 500 mg by mouth 2 (two) times daily.  02/23/17  Yes Ladene Artist, MD  Multiple Vitamin (DAILY VITAMIN PO) Take 1 tablet by mouth daily.    Yes [provider]  nebivolol (BYSTOLIC) 5 MG tablet Take 1 tablet (5 mg total) by mouth daily. 08/10/17  Yes Josue Hector, MD  pravastatin (PRAVACHOL) 10 MG tablet Take 1 tablet (10 mg total) by mouth daily. Patient taking differently: Take 10 mg by mouth every evening.  09/16/17  Yes Paz, Alda Berthold, MD  Probiotic Product (PROBIOTIC DAILY PO) Take 1 tablet by mouth daily.   Yes [provider]  tamsulosin (FLOMAX) 0.4 MG CAPS capsule Take 0.4 mg by mouth at bedtime. 01/27/18  Yes [provider]  predniSONE (DELTASONE) 10 MG tablet Take 1 tablet (10 mg total) by mouth 2 (two) times daily with a meal. Patient not taking: Reported on 02/12/2018 01/09/18   Debbe Odea, MD  sulfamethoxazole-trimethoprim (BACTRIM DS,SEPTRA DS) 800-160 MG tablet Take 1 tablet by mouth 2 (two) times daily. Patient not taking: Reported on 02/18/2018 01/14/18   Colon Branch, MD    Physical Exam: Vitals:   02/15/2018 1415 02/07/2018 1430 02/20/2018 1445 02/01/2018 1500  BP: (!) 115/57 131/67 129/67 123/62  Pulse: 62 73 69 68  Resp: 20 (!) 25 (!) 23 (!) 21  Temp:      TempSrc:      SpO2: 91% 95% 94% 94%     General:  Appears calm and comfortable and is NAD, on BIPAP Eyes:  PERRL, EOMI, normal lids, iris ENT:  Hard of  hearing,wearing BIPAP Neck:  no LAD, masses or thyromegaly; no carotid bruits Cardiovascular:  Irregularly irregular, rate controlled, no m/r/g. No LE edema.  Respiratory:   Moderate coarse breath sounds diffusely with consistent air movement.  Normal to mildly increased respiratory effort. Abdomen:  soft, NT, ND, NABS Skin:  no rash or induration seen on limited exam Musculoskeletal:  grossly normal tone BUE/BLE, good ROM, no bony abnormality Psychiatric: alert, pleasant, but unable to assess mental status because the patient had very little to say Neurologic: unable to perform   Radiological Exams on Admission: Dg Chest Port 1 View  Result Date: 02/17/2018 CLINICAL DATA:  Respiratory distress starting in our ago. History of COPD and pulmonary fibrosis. EXAM: PORTABLE CHEST 1 VIEW COMPARISON:  01/07/2018 FINDINGS: Heart size and pulmonary vascularity are normal. Interstitial infiltrates throughout the lungs similar to previous study consistent with history of pulmonary fibrosis. Mild blunting of costophrenic angles is probably due to pleural thickening. Pleural thickening demonstrated in the right apex. No focal consolidation in the lungs. No pneumothorax. Mediastinal contours appear intact. IMPRESSION: Chronic fibrosis in the lungs. No evidence of active pulmonary disease. Electronically Signed   By: Lucienne Capers M.D.   On: 02/14/2018 03:42    EKG: Independently reviewed.  Afib with rate 89; nonspecific ST changes with severe global ischemia which appears to be new/worse from prior  Labs on Admission: I have personally reviewed the available labs and imaging studies at the time of the admission.  Pertinent labs:   ABG: 7.293/55.1/56.0 Troponin 0.00 K+ 2.8 Glucose 144 Albumin 2.4 BNP 235.0   Assessment/Plan Principal Problem:  Acute on chronic respiratory failure with hypoxia (HCC) Active Problems:   Essential hypertension   Crohn's disease (HCC)   IPF (idiopathic  pulmonary fibrosis) (HCC)   A-fib (HCC)   Chronic anticoagulation   HLD (hyperlipidemia)   Acute on chronic respiratory failure due to IPF -Patient with chronic respiratory failure presenting with acute worsening overnight  -He is enrolled in Hospice for this condition, but appears to be newly enrolled -O2 sats at home dropped to 72-76% on 10L and he was sent to the ER -He was placed on BIPAP on presentation -Hospice RN was present at home and they are planning to see him in the hospital -For now, will continue BIPAP and admit to SDU -Atrovent nebs q4h with Xopenex q6h; Mucinex DM -He was also given Mag sulfate and Solumedrol in the ER -He may be appropriate for transition to comfort measures only, but will await input from [place] for now  Afib, on Eliquis -Rate controlled on Bystolic  -Continue Eliquis  Crohn's -Continue mesalamine, imodium prn  HTN -Continue Bystolic  HLD -Continue Pravachol for now, but it may be reasonable to stop this medication given his terminal condition   DVT prophylaxis:  Lovenox Code Status: DNR - confirmed with Hospice Family Communication: None present but I spoke with the Hospice Case Manager by telephone Disposition Plan: To be determined Consults called: Hospice Admission status: Admit - It is my clinical opinion that admission to INPATIENT is reasonable and necessary because of the expectation that this patient will require hospital care that crosses at least 2 midnights to treat this condition based on the medical complexity of the problems presented.  Given the aforementioned information, the predictability of an adverse outcome is felt to be significant.    Karmen Bongo MD [facility]  If note is complete, please contact covering daytime or nighttime physician. [email or web address] Password [facility]  02/10/2018, 3:27 PM

## 2018-02-08 NOTE — Plan of Care (Signed)
Discussed with patient plan of care for the evening, pain management and mouth care with the Bipap with some teach back displayed.

## 2018-02-08 NOTE — H&P (Signed)
This is a no charge note  Pending admission per Dr. Betsey Holiday  84 year old male with past medical history of severe COPD on 6 L oxygen, pulmonary fibrosis, hypertension, stroke, depression, anxiety, atrial fibrillation on Eliquis, Crohn's disease, BPH, who presents acute on chronic respiratory distress with hypoxia.  ABG with pH 7.293, PCO2 53, PO2 56.  Chest x-ray is negative for infiltration.  Blood pressures are soft.  Will give NS saline bolus now. Per EDP, patient recently entered hospice care and is DNR, which is confirmed by his wife. Admitted to stepdown as inpatient.    Brian Costa, MD  [facility] Pager [phone]  If 7PM-7AM, please contact night-coverage [email or web address] Password [email or web address] 02/01/2018, 6:02 AM

## 2018-02-08 NOTE — ED Notes (Signed)
Report called to 2w RN.  Waiting on RT to assist in transport on bipap

## 2018-02-08 NOTE — Progress Notes (Signed)
[facility] MSW note: Pt is a current [facility] home care patient. Pt lives at home with spouse. Family is supportive. MSW met with pt and spouse in ED and completed psychosocial assessment. Pt and spouse have been married 70 years. Goal is for pt to return home at hospital discharge with continued hospice services. Pt desires DNR. Pt will need DNR gold form before going home. Please call if needs arise or at discharge.   Marilynne Halsted, MSW [phone]

## 2018-02-09 ENCOUNTER — Telehealth: Payer: Self-pay

## 2018-02-09 LAB — MAGNESIUM: MAGNESIUM: 1.8 mg/dL (ref 1.7–2.4)

## 2018-02-09 LAB — MRSA PCR SCREENING: MRSA by PCR: NEGATIVE

## 2018-02-09 MED ORDER — LORAZEPAM 2 MG/ML IJ SOLN
2.0000 mg | INTRAMUSCULAR | Status: DC | PRN
  Filled 2018-02-09: qty 1

## 2018-02-09 MED ORDER — LORAZEPAM 2 MG/ML IJ SOLN
0.5000 mg | Freq: Once | INTRAMUSCULAR | Status: AC
  Administered 2018-02-09: 0.5 mg via INTRAVENOUS
  Filled 2018-02-09: qty 1

## 2018-02-09 MED ORDER — IPRATROPIUM BROMIDE 0.02 % IN SOLN
0.5000 mg | Freq: Four times a day (QID) | RESPIRATORY_TRACT | Status: DC
  Administered 2018-02-09: 0.5 mg via RESPIRATORY_TRACT
  Filled 2018-02-09: qty 2.5

## 2018-02-09 MED ORDER — MORPHINE SULFATE (PF) 2 MG/ML IV SOLN
2.0000 mg | INTRAVENOUS | Status: DC | PRN
  Administered 2018-02-09: 2 mg via INTRAVENOUS
  Filled 2018-02-09: qty 1

## 2018-02-10 NOTE — Telephone Encounter (Signed)
Call the patient to provide condolences, I left a message with her g- daughter who was at home.  Advised to call me anytime if she needs to talk or if she has a question

## 2018-02-11 DIAGNOSIS — J8410 Pulmonary fibrosis, unspecified: Secondary | ICD-10-CM | POA: Diagnosis not present

## 2018-02-11 DIAGNOSIS — J84112 Idiopathic pulmonary fibrosis: Secondary | ICD-10-CM | POA: Diagnosis not present

## 2018-02-21 NOTE — Progress Notes (Addendum)
[id number] ([facility] GIP Visit   This is related and covered GIP admission of 01/24/2018 with [facility] diagnosis of Pulmonary Fibrosis per Dr. Lyman Speller. Pt has an OOF DNR.  Family contacted [facility], RN made visit, pt was SOB and the family felt he needed to be transferred to the hospital.  EMS was called and the pt was transported to [facility] ED for further evaluation.  Pt was admitted with respiratory distress.  This is day 2 of [facility] GIP.  Visited patient, spouse and son at the bedside.  Pt on bipap, not interactive, occasionally flailing his arms up, does not track when spoken to or respond to touch.  Wife states "this isn't him".  Bipap was on 100%, and RT was in the process of removing this to replace with NRB.  RN at bedside administering morphine.  Wife said they had made the decision to make him comfortable and remove the bipap. Offered emotional support and listened.    Updated [facility] chaplain, and SW.  Impending passing during this hospitalization.  Transfer summary and medication list placed on chart.   [facility] will continue to follow and offer support.  Please call with any hospice related questions or concerns.  Venia Carbon BSN, RN [facility] Liaison (listed in [place]

## 2018-02-21 NOTE — Progress Notes (Signed)
Family present in room.Dr. Posey Pronto called for family.

## 2018-02-21 NOTE — Progress Notes (Signed)
[facility] Note:  Patient is a home care patient with [facility]. Chaplain visited to assess for spiritual needs and offer spiritual care. Upon arrival, chaplain met patient's son in the hallway and learned that patient had passed away shortly before chaplain's arrival. Condolences offered. Family desires privacy. No spiritual needs at this time.   [address] Clide [name], [place]astra [place]on

## 2018-02-21 NOTE — Telephone Encounter (Signed)
Copied from [place] [id number]. Topic: General - Deceased Patient >> February 23, 2018  4:52 PM Cecelia Byars, NT wrote: Reason for CRM: The patients wife called today to let the practice know the patient passed away at [place] to department's PEC Pool.

## 2018-02-21 NOTE — Progress Notes (Addendum)
Pt confirmed time of death by 2 nurses at 1245. MD paged.

## 2018-02-21 NOTE — Progress Notes (Signed)
Removed bipap from pt room per MD, RN at bedside

## 2018-02-21 NOTE — Discharge Summary (Signed)
Death Summary  Brian [name] [id number]:[id number] DOB: March 26, 1928 DOA: 02/16/2018  PCP: Colon Branch, MD  Admit date: 2018/02/16 Date of Death: 02/17/18  Final Diagnoses:  Principal Problem:   Acute on chronic respiratory failure with hypoxia ([place]) Advanced pulmonary fibrosis Severe COPD Metabolic encephalopathy   Essential hypertension   Crohn's disease (HCC)   IPF (idiopathic pulmonary fibrosis) (HCC)   A-fib (HCC)   Chronic anticoagulation   HLD (hyperlipidemia)   History of present illness:  Dona Klemann Gibsonis a 84 y.o.malewith medical history significant ofsevere COPD on 6 L oxygen, pulmonary fibrosis, hypertension, stroke, depression, anxiety, atrial fibrillation on Eliquis, Crohn's disease, BPH, who presents acute on chronic respiratory distress with hypoxia.The patient was alert but unable to communicate, he was placed on BiPAP in the emergency room -He has progressive COPD and pulmonary fibrosis, has been on 6 L oxygen most recently increased to 10 L, just started home hospice services this Friday. -He has been losing weight, severely debilitated and declining for several months according to spouse  Hospital Course:  Acute on chronic hypoxic respiratory failure -Due to severe COPD and advanced pulmonary fibrosis -Chest x-ray without any acute findings, notes chronic lung disease -Worsening hypoxemia at home despite 10 L O2, sats dropping into the 70s -This is progression of his chronic lung disease,  started on BiPAP on admission -I discussed situation with the patient's wife and son this morning, he has progressive advanced pulmonary fibrosis and severe COPD, with significantly worsening O2 needs, ongoing debility, failure to thrive, and metabolic encephalopathy at this time -He was just started on hospice services last week, we recommended comfort focused care and discontinuing BiPAP -Family agreed to comfort care, he was started on morphine PRN, taken off BiPAP ,  subsequently expired at 1245 today    Time: 22min  Signed:  Domenic Polite  [facility] 2018/02/17, 1:18 PM

## 2018-02-21 NOTE — Progress Notes (Addendum)
RT Note:  Patient NTS with scant amount of thick tan secretions.  Deep oral suction. Small thick white secretions. Placed on NRB while NTS performed. spo2 dropped in 60's.  Placed back on BIPAP

## 2018-02-21 DEATH — deceased

## 2018-03-10 ENCOUNTER — Ambulatory Visit: Payer: Self-pay | Admitting: Internal Medicine
